# Patient Record
Sex: Male | Born: 1940 | Race: White | Hispanic: No | Smoking: Current every day smoker
Health system: Southern US, Community
[De-identification: ages and names within clinical notes are randomized; demographics above are authoritative.]

## PROBLEM LIST (undated history)

## (undated) DIAGNOSIS — K5909 Other constipation: Secondary | ICD-10-CM

## (undated) DIAGNOSIS — N183 Chronic kidney disease, stage 3 unspecified: Secondary | ICD-10-CM

## (undated) DIAGNOSIS — G629 Polyneuropathy, unspecified: Secondary | ICD-10-CM

## (undated) DIAGNOSIS — I10 Essential (primary) hypertension: Secondary | ICD-10-CM

## (undated) DIAGNOSIS — E785 Hyperlipidemia, unspecified: Secondary | ICD-10-CM

## (undated) DIAGNOSIS — R0600 Dyspnea, unspecified: Secondary | ICD-10-CM

## (undated) DIAGNOSIS — I5030 Unspecified diastolic (congestive) heart failure: Secondary | ICD-10-CM

## (undated) DIAGNOSIS — M1990 Unspecified osteoarthritis, unspecified site: Secondary | ICD-10-CM

## (undated) DIAGNOSIS — Z860100 Personal history of colon polyps, unspecified: Secondary | ICD-10-CM

## (undated) DIAGNOSIS — Z8711 Personal history of peptic ulcer disease: Secondary | ICD-10-CM

## (undated) DIAGNOSIS — D509 Iron deficiency anemia, unspecified: Secondary | ICD-10-CM

## (undated) DIAGNOSIS — Z7901 Long term (current) use of anticoagulants: Secondary | ICD-10-CM

## (undated) DIAGNOSIS — R001 Bradycardia, unspecified: Secondary | ICD-10-CM

## (undated) DIAGNOSIS — M25561 Pain in right knee: Secondary | ICD-10-CM

## (undated) DIAGNOSIS — G4700 Insomnia, unspecified: Secondary | ICD-10-CM

## (undated) DIAGNOSIS — I712 Thoracic aortic aneurysm, without rupture: Secondary | ICD-10-CM

## (undated) DIAGNOSIS — G8929 Other chronic pain: Secondary | ICD-10-CM

## (undated) DIAGNOSIS — I7121 Aneurysm of the ascending aorta, without rupture: Secondary | ICD-10-CM

## (undated) DIAGNOSIS — R0609 Other forms of dyspnea: Secondary | ICD-10-CM

## (undated) DIAGNOSIS — I739 Peripheral vascular disease, unspecified: Secondary | ICD-10-CM

## (undated) DIAGNOSIS — Z8719 Personal history of other diseases of the digestive system: Secondary | ICD-10-CM

## (undated) DIAGNOSIS — F329 Major depressive disorder, single episode, unspecified: Secondary | ICD-10-CM

## (undated) DIAGNOSIS — E119 Type 2 diabetes mellitus without complications: Secondary | ICD-10-CM

## (undated) DIAGNOSIS — Z720 Tobacco use: Secondary | ICD-10-CM

## (undated) DIAGNOSIS — T8453XA Infection and inflammatory reaction due to internal right knee prosthesis, initial encounter: Secondary | ICD-10-CM

## (undated) DIAGNOSIS — F32A Depression, unspecified: Secondary | ICD-10-CM

## (undated) DIAGNOSIS — Z9889 Other specified postprocedural states: Secondary | ICD-10-CM

## (undated) DIAGNOSIS — K219 Gastro-esophageal reflux disease without esophagitis: Secondary | ICD-10-CM

## (undated) HISTORY — DX: Bradycardia, unspecified: R00.1

## (undated) HISTORY — DX: Personal history of other diseases of the digestive system: Z87.19

## (undated) HISTORY — PX: COLONOSCOPY: SHX174

## (undated) HISTORY — DX: Essential (primary) hypertension: I10

## (undated) HISTORY — PX: ANTERIOR CERVICAL DECOMP/DISCECTOMY FUSION: SHX1161

## (undated) HISTORY — PX: UPPER ENDOSCOPY W/ SCLEROTHERAPY: SHX2606

## (undated) HISTORY — PX: LUMBAR FUSION: SHX111

## (undated) HISTORY — PX: SPLENECTOMY: SUR1306

## (undated) HISTORY — PX: TONSILLECTOMY: SUR1361

---

## 1998-11-01 ENCOUNTER — Ambulatory Visit (HOSPITAL_COMMUNITY): Admission: RE | Admit: 1998-11-01 | Discharge: 1998-11-01 | Payer: Self-pay | Admitting: Family Medicine

## 1998-11-01 ENCOUNTER — Encounter: Payer: Self-pay | Admitting: Family Medicine

## 2004-09-13 ENCOUNTER — Ambulatory Visit (HOSPITAL_COMMUNITY): Admission: RE | Admit: 2004-09-13 | Discharge: 2004-09-13 | Payer: Self-pay | Admitting: Family Medicine

## 2005-01-28 DIAGNOSIS — Z8719 Personal history of other diseases of the digestive system: Secondary | ICD-10-CM

## 2005-01-28 HISTORY — DX: Personal history of other diseases of the digestive system: Z87.19

## 2005-01-28 HISTORY — PX: UMBILICAL HERNIA REPAIR: SHX196

## 2005-06-11 ENCOUNTER — Ambulatory Visit (HOSPITAL_COMMUNITY): Admission: RE | Admit: 2005-06-11 | Discharge: 2005-06-11 | Payer: Self-pay | Admitting: Family Medicine

## 2005-07-31 ENCOUNTER — Emergency Department (HOSPITAL_COMMUNITY): Admission: EM | Admit: 2005-07-31 | Discharge: 2005-07-31 | Payer: Self-pay | Admitting: Emergency Medicine

## 2005-08-03 ENCOUNTER — Emergency Department (HOSPITAL_COMMUNITY): Admission: EM | Admit: 2005-08-03 | Discharge: 2005-08-03 | Payer: Self-pay | Admitting: Emergency Medicine

## 2005-08-07 ENCOUNTER — Encounter (HOSPITAL_COMMUNITY): Admission: RE | Admit: 2005-08-07 | Discharge: 2005-10-10 | Payer: Self-pay | Admitting: Emergency Medicine

## 2005-08-16 ENCOUNTER — Ambulatory Visit (HOSPITAL_COMMUNITY): Admission: RE | Admit: 2005-08-16 | Discharge: 2005-08-16 | Payer: Self-pay | Admitting: Neurosurgery

## 2005-09-09 ENCOUNTER — Ambulatory Visit (HOSPITAL_COMMUNITY): Admission: RE | Admit: 2005-09-09 | Discharge: 2005-09-09 | Payer: Self-pay

## 2005-10-08 ENCOUNTER — Encounter: Admission: RE | Admit: 2005-10-08 | Discharge: 2005-10-08 | Payer: Self-pay | Admitting: Neurosurgery

## 2005-10-16 ENCOUNTER — Inpatient Hospital Stay (HOSPITAL_COMMUNITY): Admission: EM | Admit: 2005-10-16 | Discharge: 2005-10-20 | Payer: Self-pay | Admitting: Emergency Medicine

## 2005-10-16 ENCOUNTER — Ambulatory Visit: Payer: Self-pay | Admitting: Internal Medicine

## 2005-10-23 ENCOUNTER — Ambulatory Visit: Payer: Self-pay | Admitting: Internal Medicine

## 2005-11-08 ENCOUNTER — Ambulatory Visit: Payer: Self-pay | Admitting: Internal Medicine

## 2005-11-21 ENCOUNTER — Ambulatory Visit (HOSPITAL_BASED_OUTPATIENT_CLINIC_OR_DEPARTMENT_OTHER): Admission: RE | Admit: 2005-11-21 | Discharge: 2005-11-21 | Payer: Self-pay | Admitting: Family Medicine

## 2005-11-24 ENCOUNTER — Ambulatory Visit: Payer: Self-pay | Admitting: Internal Medicine

## 2005-12-03 ENCOUNTER — Ambulatory Visit: Payer: Self-pay | Admitting: Internal Medicine

## 2010-05-21 ENCOUNTER — Encounter: Payer: Self-pay | Admitting: Physician Assistant

## 2010-05-21 ENCOUNTER — Telehealth: Payer: Self-pay | Admitting: Internal Medicine

## 2010-05-21 ENCOUNTER — Other Ambulatory Visit (INDEPENDENT_AMBULATORY_CARE_PROVIDER_SITE_OTHER): Payer: Medicare Other

## 2010-05-21 ENCOUNTER — Ambulatory Visit (INDEPENDENT_AMBULATORY_CARE_PROVIDER_SITE_OTHER): Payer: Medicare Other | Admitting: Physician Assistant

## 2010-05-21 VITALS — BP 122/82 | HR 60 | Ht 70.0 in | Wt 218.0 lb

## 2010-05-21 DIAGNOSIS — Z860100 Personal history of colon polyps, unspecified: Secondary | ICD-10-CM

## 2010-05-21 DIAGNOSIS — I10 Essential (primary) hypertension: Secondary | ICD-10-CM

## 2010-05-21 DIAGNOSIS — M51379 Other intervertebral disc degeneration, lumbosacral region without mention of lumbar back pain or lower extremity pain: Secondary | ICD-10-CM

## 2010-05-21 DIAGNOSIS — R634 Abnormal weight loss: Secondary | ICD-10-CM

## 2010-05-21 DIAGNOSIS — Z8711 Personal history of peptic ulcer disease: Secondary | ICD-10-CM

## 2010-05-21 DIAGNOSIS — R1013 Epigastric pain: Secondary | ICD-10-CM

## 2010-05-21 DIAGNOSIS — M5136 Other intervertebral disc degeneration, lumbar region: Secondary | ICD-10-CM | POA: Insufficient documentation

## 2010-05-21 DIAGNOSIS — I119 Hypertensive heart disease without heart failure: Secondary | ICD-10-CM | POA: Insufficient documentation

## 2010-05-21 DIAGNOSIS — E119 Type 2 diabetes mellitus without complications: Secondary | ICD-10-CM

## 2010-05-21 DIAGNOSIS — M5137 Other intervertebral disc degeneration, lumbosacral region: Secondary | ICD-10-CM

## 2010-05-21 LAB — CBC WITH DIFFERENTIAL/PLATELET
Basophils Absolute: 0.1 10*3/uL (ref 0.0–0.1)
Eosinophils Absolute: 0.3 10*3/uL (ref 0.0–0.7)
Lymphocytes Relative: 32.3 % (ref 12.0–46.0)
MCHC: 34.9 g/dL (ref 30.0–36.0)
MCV: 99.3 fl (ref 78.0–100.0)
Monocytes Absolute: 0.6 10*3/uL (ref 0.1–1.0)
Neutrophils Relative %: 55.9 % (ref 43.0–77.0)
Platelets: 225 10*3/uL (ref 150.0–400.0)

## 2010-05-21 LAB — LIPASE: Lipase: 190 U/L — ABNORMAL HIGH (ref 11.0–59.0)

## 2010-05-21 MED ORDER — ESOMEPRAZOLE MAGNESIUM 40 MG PO CPDR: 40.0000 mg | DELAYED_RELEASE_CAPSULE | Freq: Every day | ORAL | Status: DC

## 2010-05-21 NOTE — Telephone Encounter (Signed)
Pt is having abdominal pain and has tried various OTC meds and Nexium that Dr. Hennie Duos prescribed. Pt is also having black stools, has history of ulcers. Dr. Jeannetta Nap would like to have the patient seen today. Appt made for pt to see Mike Gip, PA today at 3pm. Christy at Dr. Hennie Duos office to notify pt of appt date and time.

## 2010-05-21 NOTE — Progress Notes (Signed)
Agree with assessment and plans as outlined 

## 2010-05-21 NOTE — Progress Notes (Signed)
Subjective:    Patient ID: Marc [name], male    DOB: 1940-05-22, 70 y.o.   MRN: [id number]  HPI Marc [name] is a pleasant 69 year old white male known to Dr. Georga Hacking. He has history of an acute GI bleed in 2007 for which she was hospitalized and required approximately 10 units of blood per patient. He was found to have antral ulcer versus one of which was treated with epi and BiCAP. This was felt to be NSAID-induced. He also had colonoscopy in 2007 with removal of 2 colon polyps, one adenomatous and one hyperplastic. He will be due for followup September 2012.  Patient is referred today by Dr. Windle Guard for evaluation of acute abdominal pain. Patient relates he had onset of pain approximately a week ago and that his pain has been constant ever since. He describes it as an aching burning type of pain which only eases off with sleep. This seems to be worse with any by mouth intake and he has lost 7 or 8 pounds since onset. He has not had any nausea or vomiting, no fever or chills. He denies any heartburn indigestion or dysphagia. He has not been on any recent aspirin or NSAIDs and no EtOH. He has also been having black stools with one bowel movement per day. He had been taking antacids as well as Pepto-Bismol which he stopped over the weekend. He says his stools to look dark this morning. He was seen by Dr. Jeannetta Nap late last week and was started on Nexium 40 mg daily but has not noticed any difference in his symptoms as yet. He also had labs done which showed a glucose of 268 liver function studies were normal amylase 89 lipase was elevated at 277.    Review of Systems  Constitutional: Positive for appetite change and unexpected weight change.  HENT: Negative.   Eyes: Negative.   Respiratory: Negative.   Cardiovascular: Negative.   Gastrointestinal: Positive for abdominal pain.  Genitourinary: Negative.   Musculoskeletal: Positive for back pain.  Skin: Negative.   Neurological: Negative.     Hematological: Negative.   Psychiatric/Behavioral: Negative.    Outpatient Encounter Prescriptions as of 05/21/2010  Medication Sig Dispense Refill  . allopurinol (ZYLOPRIM) 100 MG tablet Take 100 mg by mouth daily.        [name] gabapentin (NEURONTIN) 600 MG tablet Take 600 mg by mouth 3 (three) times daily. 2 tablets every 6 hours as needed       . glimepiride (AMARYL) 4 MG tablet Take 4 mg by mouth daily before breakfast.        . lisinopril (PRINIVIL,ZESTRIL) 20 MG tablet Take 20 mg by mouth daily.        [name] METOPROLOL TARTRATE PO Take by mouth. 50 mg 1 by mouth every day       . esomeprazole (NEXIUM) 40 MG capsule Take 1 capsule (40 mg total) by mouth daily.  30 capsule  1   No Known Allergies     Objective:   Physical Exam Well-developed white male in no acute distress, pleasant, ambulates slowly. HEENT; not reticulocyte normocephalic EOMI PERRLA sclera anicteric  Neck ;supple no JVD  Cardiovascular; regular rate and rhythm with S1-S2 no significant murmur rub or gallop Pulmonary; decreased breath sounds bilaterally  Abdome; soft, somewhat protuberant, bowel sounds active, mildly tender in the epigastrium, no guarding or rebound, no palpable masses or hepatosplenomegaly, long midline incisional scar  Rectal exam; Brown stool Hemoccult negative  Neuro; alert and oriented x3 and  grossly nonfocal  Psych; mood and affect normal an appropriate.        Assessment & Plan:  #15 70 year old male with one-week history of acute epigastric pain, anorexia and weight loss. Recent labs showing an elevated lipase. Rule out mild pancreatitis, pancreatic lesion, rule out recurrent peptic ulcer disease.  Plan; Will check CBC, and repeat amylase and lipase Continue Nexium 40 mg once daily, patient was given additional samples today Schedule for CT scan of the abdomen and pelvis within the next 24-48 hours Advised soft low-fat frequent small meals Further plans pending results of CT scan.  #2 History of  antral ulcers with GI bleed 2007, NSAID-induced-also H. pylori positive and treated.  #3 History of adenomatous and hyperplastic colon polyps Last colonoscopy September 2007 will be due for followup this year.  #4 adult onset diabetes mellitus

## 2010-05-21 NOTE — Patient Instructions (Signed)
We have scheduled the CT scan at [facility]. Directions provided. Nexium samples and CT contrast provided. Stay on the Nexium.

## 2010-05-22 ENCOUNTER — Ambulatory Visit (INDEPENDENT_AMBULATORY_CARE_PROVIDER_SITE_OTHER)
Admission: RE | Admit: 2010-05-22 | Discharge: 2010-05-22 | Disposition: A | Discharge status: Home / Self Care | Payer: Medicare Other | Source: Ambulatory Visit | Attending: Physician Assistant | Admitting: Physician Assistant

## 2010-05-22 DIAGNOSIS — R1013 Epigastric pain: Secondary | ICD-10-CM

## 2010-05-22 DIAGNOSIS — R634 Abnormal weight loss: Secondary | ICD-10-CM

## 2010-05-22 MED ORDER — IOHEXOL 300 MG/ML  SOLN
100.0000 mL | Freq: Once | INTRAMUSCULAR | Status: AC | PRN
  Administered 2010-05-22: 100 mL via INTRAVENOUS

## 2010-05-23 ENCOUNTER — Telehealth: Payer: Self-pay

## 2010-05-23 NOTE — Telephone Encounter (Signed)
Spoke with pt and he is aware of Amy Esterwood's recommendations. Previsit scheduled for 06/01/10@2pm , Endo scheduled for 06/06/10@8 :30am.

## 2010-05-23 NOTE — Telephone Encounter (Signed)
Message copied by Chrystie Nose on Wed May 23, 2010  2:08 PM ------      Message from: [place], [place]      Created: Wed May 23, 2010 12:04 PM       PLEASE CALL Marc [name], AND SEE HOW HE IS FEELING. LET HIM KNOW THE LABS WERE OK EXCEPT THE PANCREAS TEST. CT SHOWS MILD PANCREATITIS,AND POSSIBLE DUODENAL INFLAMMATION/ULCER. BOTH WOULD CAUSE PAIN. HE SHOULD EAT LOW FAT,SCHEDULE FOR EGD WITH DR. PERRY ON 5/9. TAKE WHICHEVER PPI I GAVE HIM TWICE DAILY . HE SHOULD CALL IF HE FEELS WORSE.THANKS

## 2010-05-24 ENCOUNTER — Encounter: Payer: Self-pay | Admitting: Physician Assistant

## 2010-06-01 ENCOUNTER — Encounter: Payer: Self-pay | Admitting: Internal Medicine

## 2010-06-01 ENCOUNTER — Ambulatory Visit (AMBULATORY_SURGERY_CENTER): Payer: Medicare Other | Admitting: *Deleted

## 2010-06-01 VITALS — Ht 74.0 in | Wt 221.5 lb

## 2010-06-01 DIAGNOSIS — R1013 Epigastric pain: Secondary | ICD-10-CM

## 2010-06-05 ENCOUNTER — Encounter: Payer: Self-pay | Admitting: Internal Medicine

## 2010-06-06 ENCOUNTER — Ambulatory Visit (AMBULATORY_SURGERY_CENTER): Payer: Medicare Other | Admitting: Internal Medicine

## 2010-06-06 ENCOUNTER — Encounter: Payer: Self-pay | Admitting: Internal Medicine

## 2010-06-06 VITALS — BP 156/61 | HR 45 | Temp 98.0°F | Resp 14 | Ht 74.0 in | Wt 218.0 lb

## 2010-06-06 DIAGNOSIS — R933 Abnormal findings on diagnostic imaging of other parts of digestive tract: Secondary | ICD-10-CM

## 2010-06-06 DIAGNOSIS — K222 Esophageal obstruction: Secondary | ICD-10-CM

## 2010-06-06 DIAGNOSIS — R1013 Epigastric pain: Secondary | ICD-10-CM

## 2010-06-06 DIAGNOSIS — R109 Unspecified abdominal pain: Secondary | ICD-10-CM

## 2010-06-06 LAB — GLUCOSE, CAPILLARY
Glucose-Capillary: 209 mg/dL — ABNORMAL HIGH (ref 70–99)
Glucose-Capillary: 223 mg/dL — ABNORMAL HIGH (ref 70–99)

## 2010-06-06 MED ORDER — SODIUM CHLORIDE 0.9 % IV SOLN: 500.0000 mL | INTRAVENOUS | Status: DC

## 2010-06-06 NOTE — Patient Instructions (Signed)
CONTINUE NEXIUM UNTIL FINISHED THEN START PRILOSEC OVER THE COUNTER OR GENERIC OMEPRAZOLE 20 MG DAILY UNTIL OFFICE VISIT WITH DR Marina Goodell  PLEASE CALL DR [facility] OFFICE AT [phone] IN THE NEXT 2-3 DAYS TO SCHEDULE AN APPOINTMENT WITH HIM FOR 4-6 WEEKS

## 2010-06-07 ENCOUNTER — Telehealth: Payer: Self-pay | Admitting: *Deleted

## 2010-06-07 NOTE — Telephone Encounter (Signed)
No id on answering machine  

## 2010-07-09 ENCOUNTER — Ambulatory Visit (INDEPENDENT_AMBULATORY_CARE_PROVIDER_SITE_OTHER): Payer: Medicare Other | Admitting: Internal Medicine

## 2010-07-09 ENCOUNTER — Encounter: Payer: Self-pay | Admitting: Internal Medicine

## 2010-07-09 VITALS — BP 126/68 | HR 60 | Ht 74.0 in | Wt 220.0 lb

## 2010-07-09 DIAGNOSIS — K219 Gastro-esophageal reflux disease without esophagitis: Secondary | ICD-10-CM

## 2010-07-09 DIAGNOSIS — K859 Acute pancreatitis without necrosis or infection, unspecified: Secondary | ICD-10-CM

## 2010-07-09 DIAGNOSIS — R1013 Epigastric pain: Secondary | ICD-10-CM

## 2010-07-09 DIAGNOSIS — K222 Esophageal obstruction: Secondary | ICD-10-CM

## 2010-07-09 DIAGNOSIS — Z86010 Personal history of colonic polyps: Secondary | ICD-10-CM

## 2010-07-09 NOTE — Patient Instructions (Signed)
Follow-up with Dr. Perry as needed.  

## 2010-07-09 NOTE — Progress Notes (Signed)
HISTORY OF PRESENT ILLNESS:  Marc [name] is a 70 y.o. male with hypertension, diabetes mellitus, and obesity. He is followed in this office for history of peptic ulcer disease complicated by GI bleeding and adenomatous colon polyps. Evaluated in April 2012 for upper abdominal pain and weight loss. Mildly elevated lipase. CT scan showing mild stranding around the pancreatic head. Otherwise unremarkable. Subsequent upper endoscopy on 06/06/2010 was remarkable for an incidental distal esophageal stricture. He has since been continued on PPI therapy and asked to followup at this time. Since his endoscopy, he has had no further problems. No recurrent abdominal pain. Good appetite and has gained a few pounds. GI review of systems is remarkable only for mild constipation. His last colonoscopy was in November of 2007. He is due for followup this fall.  REVIEW OF SYSTEMS:  All non-GI ROS negative except for arthritis, back pain, leg swelling.  Past Medical History  Diagnosis Date  . Hypertension   . Diabetes mellitus   . Ulcer disease   . Colon polyp   . Gout   . Bradycardia     Past Surgical History  Procedure Date  . Splenectomy   . Ruptured disk     back  . Ulcer carterized   . Polypectomy   . Colonoscopy     Social History Marc [name]  reports that he has been smoking Cigarettes.  He has a 110 pack-year smoking history. He has never used smokeless tobacco. He reports that he drinks about 5.4 ounces of alcohol per week. He reports that he does not use illicit drugs.  family history includes Colon cancer in his paternal uncle and Heart disease in his father and mother.  No Known Allergies     PHYSICAL EXAMINATION:  Vital signs: BP 126/68  Pulse 60  Ht 6\' 2"  (1.88 m)  Wt 220 lb (99.791 kg)  BMI 28.25 kg/m2 General: Well-developed, well-nourished, no acute distress HEENT: Sclerae are anicteric, conjunctiva pink. Oral mucosa intact Lungs: Clear Heart:  Regular Abdomen: soft, nontender, nondistended, no obvious ascites, no peritoneal signs, normal bowel sounds. No organomegaly. Extremities: No edema Psychiatric: alert and oriented x3. Cooperative     ASSESSMENT:  #1. Recent problems with upper abdominal pain and mild stranding around the pancreatic head. May represent a case of mild pancreatitis. Currently asymptomatic. #2. History of complicated ulcer disease #3. GERD with peptic stricture #4. History of adenomatous colon polyps   PLAN:  #1. Continue daily PPI #2. Surveillance colonoscopy. 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 #3. Resume general medical care with Dr. Jeannetta [name]. GI followup as needed

## 2010-07-10 ENCOUNTER — Encounter: Payer: Self-pay | Admitting: Internal Medicine

## 2010-11-15 ENCOUNTER — Other Ambulatory Visit: Payer: Self-pay | Admitting: Family Medicine

## 2010-11-15 DIAGNOSIS — R110 Nausea: Secondary | ICD-10-CM

## 2010-11-16 ENCOUNTER — Ambulatory Visit
Admission: RE | Admit: 2010-11-16 | Discharge: 2010-11-16 | Disposition: A | Discharge status: Home / Self Care | Payer: Medicare Other | Source: Ambulatory Visit | Attending: Family Medicine | Admitting: Family Medicine

## 2010-11-16 DIAGNOSIS — R110 Nausea: Secondary | ICD-10-CM

## 2010-12-07 ENCOUNTER — Encounter: Payer: Self-pay | Admitting: Internal Medicine

## 2011-02-25 ENCOUNTER — Other Ambulatory Visit: Payer: Self-pay | Admitting: Family Medicine

## 2011-02-25 DIAGNOSIS — M25561 Pain in right knee: Secondary | ICD-10-CM

## 2011-02-27 ENCOUNTER — Ambulatory Visit
Admission: RE | Admit: 2011-02-27 | Discharge: 2011-02-27 | Disposition: A | Discharge status: Home / Self Care | Payer: Medicare Other | Source: Ambulatory Visit | Attending: Family Medicine | Admitting: Family Medicine

## 2011-02-27 DIAGNOSIS — M25561 Pain in right knee: Secondary | ICD-10-CM

## 2011-10-28 ENCOUNTER — Encounter: Payer: Self-pay | Admitting: Internal Medicine

## 2011-12-04 ENCOUNTER — Other Ambulatory Visit: Payer: Self-pay | Admitting: Family Medicine

## 2011-12-04 DIAGNOSIS — I739 Peripheral vascular disease, unspecified: Secondary | ICD-10-CM

## 2011-12-04 DIAGNOSIS — M545 Low back pain: Secondary | ICD-10-CM

## 2011-12-05 ENCOUNTER — Ambulatory Visit
Admission: RE | Admit: 2011-12-05 | Discharge: 2011-12-05 | Disposition: A | Discharge status: Home / Self Care | Payer: Medicare Other | Source: Ambulatory Visit | Attending: Family Medicine | Admitting: Family Medicine

## 2011-12-05 DIAGNOSIS — I739 Peripheral vascular disease, unspecified: Secondary | ICD-10-CM

## 2011-12-05 DIAGNOSIS — M545 Low back pain: Secondary | ICD-10-CM

## 2012-01-10 ENCOUNTER — Other Ambulatory Visit (HOSPITAL_COMMUNITY): Payer: Self-pay | Admitting: Cardiovascular Disease

## 2012-01-10 DIAGNOSIS — E119 Type 2 diabetes mellitus without complications: Secondary | ICD-10-CM

## 2012-01-10 DIAGNOSIS — I10 Essential (primary) hypertension: Secondary | ICD-10-CM

## 2012-01-10 DIAGNOSIS — I739 Peripheral vascular disease, unspecified: Secondary | ICD-10-CM

## 2012-01-16 ENCOUNTER — Other Ambulatory Visit: Payer: Self-pay | Admitting: Cardiovascular Disease

## 2012-01-16 ENCOUNTER — Ambulatory Visit: Payer: Medicare Other

## 2012-01-16 DIAGNOSIS — Z01818 Encounter for other preprocedural examination: Secondary | ICD-10-CM

## 2012-01-16 DIAGNOSIS — I10 Essential (primary) hypertension: Secondary | ICD-10-CM

## 2012-01-23 ENCOUNTER — Encounter (HOSPITAL_COMMUNITY): Payer: Self-pay | Admitting: Pharmacy Technician

## 2012-01-24 ENCOUNTER — Ambulatory Visit (HOSPITAL_COMMUNITY)
Admission: RE | Admit: 2012-01-24 | Discharge: 2012-01-24 | Disposition: A | Discharge status: Home / Self Care | Payer: Medicare Other | Source: Ambulatory Visit | Attending: Cardiovascular Disease | Admitting: Cardiovascular Disease

## 2012-01-24 DIAGNOSIS — E119 Type 2 diabetes mellitus without complications: Secondary | ICD-10-CM | POA: Insufficient documentation

## 2012-01-24 DIAGNOSIS — I739 Peripheral vascular disease, unspecified: Secondary | ICD-10-CM | POA: Insufficient documentation

## 2012-01-24 DIAGNOSIS — I10 Essential (primary) hypertension: Secondary | ICD-10-CM | POA: Insufficient documentation

## 2012-01-24 MED ORDER — TECHNETIUM TC 99M SESTAMIBI GENERIC - CARDIOLITE
10.2000 | Freq: Once | INTRAVENOUS | Status: AC | PRN
  Administered 2012-01-24: 10 via INTRAVENOUS

## 2012-01-24 MED ORDER — AMINOPHYLLINE 25 MG/ML IV SOLN
75.0000 mg | Freq: Once | INTRAVENOUS | Status: AC
  Administered 2012-01-24: 75 mg via INTRAVENOUS

## 2012-01-24 MED ORDER — REGADENOSON 0.4 MG/5ML IV SOLN
0.4000 mg | Freq: Once | INTRAVENOUS | Status: AC
  Administered 2012-01-24: 0.4 mg via INTRAVENOUS

## 2012-01-24 MED ORDER — TECHNETIUM TC 99M SESTAMIBI GENERIC - CARDIOLITE
30.5000 | Freq: Once | INTRAVENOUS | Status: AC | PRN
  Administered 2012-01-24: 31 via INTRAVENOUS

## 2012-01-24 NOTE — Procedures (Addendum)
[place] [facility] [address] [place] [address] [phone]  Cardiology Nuclear Med Study  Marc [name] is a 71 y.o. male     MRN : [id number]     DOB: Jun 07, 1940  Procedure Date: 01/24/2012  Nuclear Med Background Indication for Stress Test:  Evaluation for Ischemia History:  PVD Cardiac Risk Factors: Family History - CAD, Hypertension, Lipids, NIDDM, Overweight and Smoker  Symptoms:  None   Nuclear Pre-Procedure Caffeine/Decaff Intake:  7:00pm NPO After: 7:00am   IV Site: R Antecubital  IV 0.9% NS with Angio Cath:  22g  Chest Size (in):  44 IV Started by: Koren Shiver, CNMT  Height: 6\' 1"  (1.854 m)  Cup Size: n/a  BMI:  Body mass index is 28.50 kg/(m^2). Weight:  216 lb (97.977 kg)   Tech Comments:  n/a    Nuclear Med Study 1 or 2 day study: 1 day  Stress Test Type:  Lexiscan  Order Authorizing Provider:  Nanetta Batty, MD   Resting Radionuclide: Technetium 7m Sestamibi  Resting Radionuclide Dose: 10.2 mCi   Stress Radionuclide:  Technetium 36m Sestamibi  Stress Radionuclide Dose: 30.5 mCi           Stress Protocol Rest HR: 42 Stress HR: 53  Rest BP: 207/91 Stress BP: 187/66  Exercise Time (min): n/a METS: n/a   Predicted Max HR: 148 bpm % Max HR: 35.81 bpm Rate Pressure Product: 95284   Dose of Adenosine (mg):  n/a Dose of Lexiscan: 0.4 mg  Dose of Atropine (mg): n/a Dose of Dobutamine: n/a mcg/kg/min (at max HR)  Stress Test Technologist: Esperanza Sheets, CCT Nuclear Technologist: Gonzella Lex, CNMT   Rest Procedure:  Myocardial perfusion imaging was performed at rest 45 minutes following the intravenous administration of Technetium 100m Sestamibi. Stress Procedure:  The patient received IV Lexiscan 0.4 mg over 15-seconds.  Technetium 69m Sestamibi injected at 30-seconds.  There were no significant changes with Lexiscan.  The Patient experienced SOB and Lightheadedness; 75 mg. IV Aminophylline was  administered with resolution of symptoms.  Quantitative spect images were obtained after a 45 minute delay.  Transient Ischemic Dilatation (Normal <1.22):  0.98 Lung/Heart Ratio (Normal <0.45):  0.34 QGS EDV:  139 ml QGS ESV:  58 ml LV Ejection Fraction: 58%  Signed by       Rest ECG: Sinus Bradycardia  Stress ECG: No significant change from baseline ECG  QPS Raw Data Images:  Normal; no motion artifact; normal heart/lung ratio. Stress Images:  Normal homogeneous uptake in all areas of the myocardium. Rest Images:  Normal homogeneous uptake in all areas of the myocardium. Subtraction (SDS):  No evidence of ischemia.  Impression Exercise Capacity:  Lexiscan with no exercise. BP Response:  Normal blood pressure response. Clinical Symptoms:  No significant symptoms noted. ECG Impression:  No significant ST segment change suggestive of ischemia. Comparison with Prior Nuclear Study: No previous nuclear study performed  Overall Impression:  Normal stress nuclear study.  LV Wall Motion:  NL LV Function; NL Wall Motion   Runell Gess, MD  01/24/2012 6:16 PM

## 2012-01-31 ENCOUNTER — Ambulatory Visit (HOSPITAL_COMMUNITY)
Admission: RE | Admit: 2012-01-31 | Discharge: 2012-01-31 | Disposition: A | Discharge status: Home / Self Care | Payer: Medicare Other | Source: Ambulatory Visit | Attending: Cardiovascular Disease | Admitting: Cardiovascular Disease

## 2012-01-31 DIAGNOSIS — I739 Peripheral vascular disease, unspecified: Secondary | ICD-10-CM | POA: Insufficient documentation

## 2012-01-31 NOTE — Progress Notes (Signed)
Arterial Doppler completed. Marc [name]

## 2012-02-04 ENCOUNTER — Encounter (HOSPITAL_COMMUNITY): Payer: Self-pay | Admitting: General Practice

## 2012-02-04 ENCOUNTER — Ambulatory Visit (HOSPITAL_COMMUNITY)
Admission: RE | Admit: 2012-02-04 | Discharge: 2012-02-05 | Disposition: A | Discharge status: Home / Self Care | Payer: Medicare Other | Source: Ambulatory Visit | Attending: Cardiovascular Disease | Admitting: Cardiovascular Disease

## 2012-02-04 ENCOUNTER — Encounter (HOSPITAL_COMMUNITY)
Admission: RE | Disposition: A | Discharge status: Home / Self Care | Payer: Self-pay | Source: Ambulatory Visit | Attending: Cardiovascular Disease

## 2012-02-04 DIAGNOSIS — I498 Other specified cardiac arrhythmias: Secondary | ICD-10-CM | POA: Insufficient documentation

## 2012-02-04 DIAGNOSIS — F17200 Nicotine dependence, unspecified, uncomplicated: Secondary | ICD-10-CM | POA: Insufficient documentation

## 2012-02-04 DIAGNOSIS — I119 Hypertensive heart disease without heart failure: Secondary | ICD-10-CM | POA: Diagnosis present

## 2012-02-04 DIAGNOSIS — R001 Bradycardia, unspecified: Secondary | ICD-10-CM | POA: Diagnosis present

## 2012-02-04 DIAGNOSIS — I70219 Atherosclerosis of native arteries of extremities with intermittent claudication, unspecified extremity: Secondary | ICD-10-CM | POA: Insufficient documentation

## 2012-02-04 DIAGNOSIS — I739 Peripheral vascular disease, unspecified: Secondary | ICD-10-CM

## 2012-02-04 DIAGNOSIS — E119 Type 2 diabetes mellitus without complications: Secondary | ICD-10-CM | POA: Insufficient documentation

## 2012-02-04 DIAGNOSIS — Z79899 Other long term (current) drug therapy: Secondary | ICD-10-CM | POA: Insufficient documentation

## 2012-02-04 DIAGNOSIS — I10 Essential (primary) hypertension: Secondary | ICD-10-CM

## 2012-02-04 DIAGNOSIS — E785 Hyperlipidemia, unspecified: Secondary | ICD-10-CM | POA: Insufficient documentation

## 2012-02-04 HISTORY — DX: Peripheral vascular disease, unspecified: I73.9

## 2012-02-04 HISTORY — PX: LOWER EXTREMITY ANGIOGRAM: SHX5508

## 2012-02-04 HISTORY — PX: PERCUTANEOUS STENT INTERVENTION: SHX5500

## 2012-02-04 HISTORY — DX: Type 2 diabetes mellitus without complications: E11.9

## 2012-02-04 LAB — POCT ACTIVATED CLOTTING TIME
Activated Clotting Time: 252 seconds
Activated Clotting Time: 165 seconds

## 2012-02-04 LAB — GLUCOSE, CAPILLARY: Glucose-Capillary: 182 mg/dL — ABNORMAL HIGH (ref 70–99)

## 2012-02-04 SURGERY — ANGIOGRAM, LOWER EXTREMITY
Anesthesia: LOCAL

## 2012-02-04 MED ORDER — HYDROCODONE-ACETAMINOPHEN 10-325 MG PO TABS
2.0000 | ORAL_TABLET | Freq: Three times a day (TID) | ORAL | Status: DC
  Administered 2012-02-04 – 2012-02-05 (×2): 2 via ORAL
  Filled 2012-02-04 (×2): qty 1
  Filled 2012-02-04: qty 2

## 2012-02-04 MED ORDER — METOPROLOL TARTRATE 100 MG PO TABS: 100.0000 mg | ORAL_TABLET | Freq: Every day | ORAL | Status: DC

## 2012-02-04 MED ORDER — PREDNISONE 10 MG PO TABS
10.0000 mg | ORAL_TABLET | Freq: Every day | ORAL | Status: DC
  Administered 2012-02-04 – 2012-02-05 (×2): 10 mg via ORAL
  Filled 2012-02-04 (×3): qty 1

## 2012-02-04 MED ORDER — LISINOPRIL 20 MG PO TABS
20.0000 mg | ORAL_TABLET | Freq: Every day | ORAL | Status: DC
  Administered 2012-02-05: 11:00:00 20 mg via ORAL
  Filled 2012-02-04: qty 1

## 2012-02-04 MED ORDER — HEPARIN SODIUM (PORCINE) 1000 UNIT/ML IJ SOLN
INTRAMUSCULAR | Status: AC
  Filled 2012-02-04: qty 1

## 2012-02-04 MED ORDER — MIDAZOLAM HCL 2 MG/2ML IJ SOLN
INTRAMUSCULAR | Status: AC
  Filled 2012-02-04: qty 2

## 2012-02-04 MED ORDER — HEPARIN (PORCINE) IN NACL 2-0.9 UNIT/ML-% IJ SOLN
INTRAMUSCULAR | Status: AC
  Filled 2012-02-04: qty 1000

## 2012-02-04 MED ORDER — SIMVASTATIN 40 MG PO TABS
40.0000 mg | ORAL_TABLET | Freq: Every day | ORAL | Status: DC
  Administered 2012-02-04: 20:00:00 40 mg via ORAL
  Filled 2012-02-04 (×2): qty 1

## 2012-02-04 MED ORDER — ASPIRIN EC 325 MG PO TBEC
325.0000 mg | DELAYED_RELEASE_TABLET | Freq: Every day | ORAL | Status: DC
  Administered 2012-02-05: 325 mg via ORAL
  Filled 2012-02-04: qty 1

## 2012-02-04 MED ORDER — SODIUM CHLORIDE 0.9 % IJ SOLN: 3.0000 mL | INTRAMUSCULAR | Status: DC | PRN

## 2012-02-04 MED ORDER — SODIUM CHLORIDE 0.9 % IV SOLN
INTRAVENOUS | Status: DC
  Administered 2012-02-04: 09:00:00 via INTRAVENOUS

## 2012-02-04 MED ORDER — FENTANYL CITRATE 0.05 MG/ML IJ SOLN
INTRAMUSCULAR | Status: AC
  Filled 2012-02-04: qty 2

## 2012-02-04 MED ORDER — CLOPIDOGREL BISULFATE 300 MG PO TABS
300.0000 mg | ORAL_TABLET | Freq: Once | ORAL | Status: AC
  Administered 2012-02-04: 300 mg via ORAL

## 2012-02-04 MED ORDER — ALLOPURINOL 300 MG PO TABS
300.0000 mg | ORAL_TABLET | Freq: Every day | ORAL | Status: DC
  Administered 2012-02-04 – 2012-02-05 (×2): 300 mg via ORAL
  Filled 2012-02-04 (×2): qty 1

## 2012-02-04 MED ORDER — DIAZEPAM 5 MG PO TABS
5.0000 mg | ORAL_TABLET | ORAL | Status: AC
  Administered 2012-02-04: 5 mg via ORAL
  Filled 2012-02-04: qty 1

## 2012-02-04 MED ORDER — ONDANSETRON HCL 4 MG/2ML IJ SOLN: 4.0000 mg | Freq: Four times a day (QID) | INTRAMUSCULAR | Status: DC | PRN

## 2012-02-04 MED ORDER — LIDOCAINE HCL (PF) 1 % IJ SOLN
INTRAMUSCULAR | Status: AC
  Filled 2012-02-04: qty 30

## 2012-02-04 MED ORDER — SODIUM CHLORIDE 0.9 % IV SOLN
INTRAVENOUS | Status: AC
  Administered 2012-02-04: 14:00:00 via INTRAVENOUS

## 2012-02-04 MED ORDER — ACETAMINOPHEN 325 MG PO TABS: 650.0000 mg | ORAL_TABLET | ORAL | Status: DC | PRN

## 2012-02-04 MED ORDER — PIOGLITAZONE HCL 30 MG PO TABS
30.0000 mg | ORAL_TABLET | Freq: Every day | ORAL | Status: DC
  Administered 2012-02-04 – 2012-02-05 (×2): 30 mg via ORAL
  Filled 2012-02-04 (×3): qty 1

## 2012-02-04 MED ORDER — PANTOPRAZOLE SODIUM 40 MG PO TBEC: 40.0000 mg | DELAYED_RELEASE_TABLET | Freq: Every day | ORAL | Status: DC

## 2012-02-04 MED ORDER — MORPHINE SULFATE 2 MG/ML IJ SOLN: 2.0000 mg | INTRAMUSCULAR | Status: DC | PRN

## 2012-02-04 MED ORDER — HYDRALAZINE HCL 20 MG/ML IJ SOLN
10.0000 mg | INTRAMUSCULAR | Status: DC | PRN
  Administered 2012-02-04: 17:00:00 10 mg via INTRAVENOUS
  Filled 2012-02-04: qty 0.5

## 2012-02-04 MED ORDER — CLOPIDOGREL BISULFATE 300 MG PO TABS
ORAL_TABLET | ORAL | Status: AC
  Filled 2012-02-04: qty 1

## 2012-02-04 MED ORDER — CLOPIDOGREL BISULFATE 75 MG PO TABS
75.0000 mg | ORAL_TABLET | Freq: Every day | ORAL | Status: DC
  Administered 2012-02-05: 75 mg via ORAL
  Filled 2012-02-04: qty 1

## 2012-02-04 MED ORDER — HYDRALAZINE HCL 20 MG/ML IJ SOLN
INTRAMUSCULAR | Status: AC
  Filled 2012-02-04: qty 1

## 2012-02-04 MED ORDER — GABAPENTIN 600 MG PO TABS
1200.0000 mg | ORAL_TABLET | Freq: Three times a day (TID) | ORAL | Status: DC
  Administered 2012-02-04 – 2012-02-05 (×2): 1200 mg via ORAL
  Filled 2012-02-04 (×6): qty 2

## 2012-02-04 MED ORDER — GLIMEPIRIDE 4 MG PO TABS
4.0000 mg | ORAL_TABLET | Freq: Two times a day (BID) | ORAL | Status: DC
  Administered 2012-02-04 – 2012-02-05 (×2): 4 mg via ORAL
  Filled 2012-02-04 (×4): qty 1

## 2012-02-04 MED ORDER — OMEPRAZOLE MAGNESIUM 20 MG PO TBEC: 20.0000 mg | DELAYED_RELEASE_TABLET | Freq: Every day | ORAL | Status: DC

## 2012-02-04 NOTE — Plan of Care (Signed)
Problem: Consults Goal: Tobacco Cessation referral if indicated Outcome: Completed/Met Date Met:  02/04/12 Pt said "there ain't no way I'm quitting.  I've been smoking since I was 72 years old."  Shows no desire for further education and becomes irritable if staff attempts to educate or bothers him for any reason.

## 2012-02-04 NOTE — Op Note (Signed)
Marc [name] is a 72 y.o. male    [id number] LOCATION:  FACILITY: [facility]  PHYSICIAN: Nanetta Batty, M.D. Jan 02, 1941   DATE OF PROCEDURE:  02/04/2012  DATE OF DISCHARGE:  [facility]  PV Intervention    History obtained from chart review. Marc [name] is a 72 year old moderately overweight married Caucasian male father of 2 who works at farming and raising cattle. He was referred through the courtesy of Dr. Andrey Campanile FOR peripheral vascular evaluation because of lifestyle limiting claudication. His risk factors include 120-pack-year history tobacco abuse, smoking 2 pack packs a day since age 72. He is treated diabetes, hypertension and hyperlipidemia. His father died of a myocardial infarction at age 87. He said claudication for the last 2-3 years, worse over the last several months, right greater than the left at less than one block. He had Dopplers performed revealing a right ABI 0.36 and 11.53 with what appears to be occluded occluded SFAs bilaterally with tibial disease as well. The Myoview stress test was low risk. He presents now for angiography and potential endovascular intervention.   PROCEDURE DESCRIPTION:    The patient was brought to the second floor  [facility] cath lab in the postabsorptive state. He was premedicated withValium 5 mg by mouth, IV Versed and fentanyl. His left groinwas prepped and shaved in usual sterile fashion. Xylocaine 1% was used for local anesthesia. A 5 French sheath was inserted into the left common femoral artery using standard Seldinger technique. The patient received 6000 units  of heparin  intravenously.  A 5 French pigtail was used for abdominal aortography with bifemoral graft using bolus chase digital subtraction step table technique. Visipaque dye was used for the entirety of the case. Retrograde aortic, left ventricular and pulmonary pressures were recorded.    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 With a 30 mm gradient noted with endhole pullback after administration of 30 micrograms of intra-arterial nitroglycerin glycerin. The SFA was occluded at its origin a reconstituted in the abductor canal. There was two-vessel run off with an occluded anterior tibial  3: Right lower extremity- the proximal and mid third of the right SFA was diffusely diseased and calcified. There is a short segment calcified occlusion at the junction of the middle and distal third of the SFA reconstituting just beyond this with one-vessel runoff peroneal. The anterior posterior tib at the ankle joint reconstituted by peroneal collaterals  IMPRESSION:Marc [name] has a moderate left iliac lesion and bilateral SFA occlusion with infrapopliteal disease. He'll leave extensive procedure on his right SFA including diamondback orbital rotational atherectomy, PTA and stenting. We'll proceed with PTA and stenting of his left common/external iliac artery today and state the right SFA procedure.  Procedure description: The short 5 French sheath was exchanged over an Building surveyor for a 7 [place] Bright tipped sheath. A total of 178 cc of contrast was administered to the patient. The ACT was measured at 252. The lesion was primarily stented with a 14 mm x 40 mm long Cordis Smart Nitinol self-expanding stent. It was post dilated with a 9 x 30 mm balloon at 4 atmospheres resulting in reduction of a 60% stenosis to 0% residual with excellent flow. The patient tolerated the procedure well. The sheath will  be removed once the ACT falls below 170 and pressure will be held on the groin to achieve hemostasis. Patient was gently hydrated overnight and discharged home in the morning. He will  be treated with aspirin Plavix. Plan will be for staged right SFA diamondback orbital rotational atherectomy, PTA and stenting. I discussed the case with the referring doctor, Dr. Windle Guard.  Marc Gess MD, [facility] 02/04/2012 1:19 PM

## 2012-02-04 NOTE — Progress Notes (Signed)
Site area: left groin  Site Prior to Removal:  Level 0  Pressure Applied For 20 MINUTES    Minutes Beginning at 1545  Manual:   yes  Patient Status During Pull:  stable  Post Pull Groin Site:  Level 0  Post Pull Instructions Given:  yes  Post Pull Pulses Present:  yes  Dressing Applied:  yes  Comments:

## 2012-02-04 NOTE — H&P (Signed)
  H & P will be scanned in.  Pt was reexamined and existing H & P reviewed. No changes found.  Runell Gess, MD [facility] 02/04/2012 12:19 PM

## 2012-02-04 NOTE — Progress Notes (Signed)
Utilization Review Completed.   Ricardo Kayes, RN, BSN Nurse Case Manager  [phone]  

## 2012-02-05 DIAGNOSIS — R001 Bradycardia, unspecified: Secondary | ICD-10-CM | POA: Diagnosis present

## 2012-02-05 LAB — BASIC METABOLIC PANEL
BUN: 20 mg/dL (ref 6–23)
CO2: 26 mEq/L (ref 19–32)
Calcium: 9.8 mg/dL (ref 8.4–10.5)
Chloride: 104 mEq/L (ref 96–112)
Creatinine, Ser: 1.32 mg/dL (ref 0.50–1.35)
GFR calc Af Amer: 61 mL/min — ABNORMAL LOW (ref >90)
GFR calc non Af Amer: 52 mL/min — ABNORMAL LOW (ref >90)
Glucose, Bld: 182 mg/dL — ABNORMAL HIGH (ref 70–99)
Glucose, Bld: 200 mg/dL — ABNORMAL HIGH (ref 70–99)
Sodium: 141 mEq/L (ref 135–145)

## 2012-02-05 LAB — CBC
HCT: 41.5 % (ref 39.0–52.0)
MCH: 32 pg (ref 26.0–34.0)
MCHC: 32 g/dL (ref 30.0–36.0)
RDW: 14 % (ref 11.5–15.5)

## 2012-02-05 LAB — GLUCOSE, CAPILLARY: Glucose-Capillary: 219 mg/dL — ABNORMAL HIGH (ref 70–99)

## 2012-02-05 MED ORDER — ASPIRIN 325 MG PO TBEC: 325.0000 mg | DELAYED_RELEASE_TABLET | Freq: Every day | ORAL | Status: DC

## 2012-02-05 MED ORDER — CLOPIDOGREL BISULFATE 75 MG PO TABS: 75.0000 mg | ORAL_TABLET | Freq: Every day | ORAL | Status: DC

## 2012-02-05 NOTE — Progress Notes (Signed)
[facility]  Subjective: No complaints. Denies pain.  Objective: Vital signs in last 24 hours: Temp:  [97.4 F (36.3 C)-98.3 F (36.8 C)] 97.8 F (36.6 C) (01/08 0530) Pulse Rate:  [38-47] 41  (01/08 0530) Resp:  [9-24] 24  (01/08 0530) BP: (128-186)/(40-128) 168/54 mmHg (01/08 0530) SpO2:  [96 %-98 %] 96 % (01/08 0530) Weight:  [96.163 kg (212 lb)-96.6 kg (212 lb 15.4 oz)] 96.6 kg (212 lb 15.4 oz) (01/08 0001) Last BM Date: 02/03/12  Intake/Output from previous day: 01/07 0701 - 01/08 0700 In: 1315 [P.O.:640; I.V.:675] Out: 725 [Urine:725] Intake/Output this shift:    Medications Current Facility-Administered Medications  Medication Dose Route Frequency Provider Last Rate Last Dose  . acetaminophen (TYLENOL) tablet 650 mg  650 mg Oral Q4H PRN Marc Gess, MD      . allopurinol (ZYLOPRIM) tablet 300 mg  300 mg Oral Daily Marc Gess, MD   300 mg at 02/04/12 1653  . aspirin EC tablet 325 mg  325 mg Oral Daily Marc Gess, MD      . clopidogrel (PLAVIX) tablet 75 mg  75 mg Oral Q breakfast Marc Gess, MD      . gabapentin (NEURONTIN) tablet 1,200 mg  1,200 mg Oral Q8H Marc Gess, MD   1,200 mg at 02/05/12 0539  . glimepiride (AMARYL) tablet 4 mg  4 mg Oral BID WC Marc Gess, MD   4 mg at 02/04/12 1654  . hydrALAZINE (APRESOLINE) injection 10 mg  10 mg Intravenous PRN Marc Gess, MD   10 mg at 02/04/12 1649  . HYDROcodone-acetaminophen (NORCO) 10-325 MG per tablet 2 tablet  2 tablet Oral Q8H Marc Gess, MD   2 tablet at 02/05/12 0539  . lisinopril (PRINIVIL,ZESTRIL) tablet 20 mg  20 mg Oral Daily Marc Gess, MD      . morphine 2 MG/ML injection 2 mg  2 mg Intravenous Q1H PRN Marc Gess, MD      . ondansetron [facility]) injection 4 mg  4 mg Intravenous Q6H PRN Marc Gess, MD      . pantoprazole (PROTONIX) EC tablet 40 mg  40 mg Oral Daily Marc Gess, MD      . pioglitazone (ACTOS)  tablet 30 mg  30 mg Oral Q breakfast Marc Gess, MD   30 mg at 02/04/12 1658  . predniSONE (DELTASONE) tablet 10 mg  10 mg Oral Daily Marc Gess, MD   10 mg at 02/04/12 1955  . simvastatin (ZOCOR) tablet 40 mg  40 mg Oral q1800 Marc Gess, MD   40 mg at 02/04/12 1955    PE: General appearance: alert, cooperative and no distress Lungs: clear to auscultation bilaterally Heart: regular rhythm, rate slow, no murmur Extremities: trace LEE Pulses: 2+ radials, 1+ DPs Skin: warm and dry, no ecchymosis, erythema or hematoma at left groin cath site Neurologic: Grossly normal  Lab Results:   [facility] 02/05/12 0645  WBC 7.5  HGB 13.3  HCT 41.5  PLT 198    Studies/Results: HEMODYNAMICS:  AO SYSTOLIC/AO DIASTOLIC: 189/71  ANGIOGRAPHIC RESULTS:  1: Abdominal aortogram-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 With a 30 mm gradient noted with endhole pullback after administration of 30 micrograms of intra-arterial nitroglycerin glycerin. The SFA was occluded at its origin a  reconstituted in the abductor canal. There was two-vessel run off with an occluded anterior tibial  3: Right lower extremity- the proximal and mid third of the right SFA was diffusely diseased and calcified. There is a short segment calcified occlusion at the junction of the middle and distal third of the SFA reconstituting just beyond this with one-vessel runoff peroneal. The anterior posterior tib at the ankle joint reconstituted by peroneal collaterals  IMPRESSION:Marc [name] has a moderate left iliac lesion and bilateral SFA occlusion with infrapopliteal disease. He'll leave extensive procedure on his right SFA including diamondback orbital rotational atherectomy, PTA and stenting. We'll proceed with PTA and stenting of his left common/external iliac  artery today and state the right SFA procedure.  Procedure description: The short 5 French sheath was exchanged over an Psychologist, educational for a 7 [place] Bright tipped sheath. A total of 178 cc of contrast was administered to the patient. The ACT was measured at 252. The lesion was primarily stented with a 14 mm x 40 mm long Cordis Smart Nitinol self-expanding stent. It was post dilated with a 9 x 30 mm balloon at 4 atmospheres resulting in reduction of a 60% stenosis to 0% residual with excellent flow. The patient tolerated the procedure well. The sheath will be removed once the ACT falls below 170 and pressure will be held on the groin to achieve hemostasis. Patient was gently hydrated overnight and discharged home in the morning. He will be treated with aspirin Plavix. Plan will be for staged right SFA diamondback orbital rotational atherectomy, PTA and stenting. I discussed the case with the referring doctor, Dr. Windle Guard.  Marc Gess MD, [facility]  02/04/2012  1:19 PM  Assessment/Plan  Principal Problem:  *PVD- s/p  PTA and stenting of left common/external iliac artery 1/7 Active Problems:  Diabetes mellitus  Hypertension Plan: S/P PTA and stenting of the left common/external iliac artery with a Cordis Smart Nitinol self-expanding stent on 1/7. He is on ASA and Plavix. Plan to undergo staged right SFA diamondback orbital rotational atherectomy, PTA and stenting. Pt has been bradycardic with rates in the 40's and 30s. Telemetry now shows sinus brady with a rate of 42. He has been asymptomatic. He is on no AV nodal blocking drugs. Pt reports that he has always had slow HRs. BP elevated. Dr. Andrey Campanile is managing BP meds. Will check BMP to assess renal function. If ok, plan for D/C today. Consider checking P2Y12 in 30 days.    LOS: 1 day    Marc [name] 02/05/2012 8:26 AM

## 2012-02-05 NOTE — Progress Notes (Signed)
Pt. Seen and examined. Agree with the NP/PA-C note as written.  S/p successful PCI to the left CIA yesterday without complication. There is extensive right SFA disease which will require diamondback atherectomy at a later date. He is without pain, groin without bruit or hematoma. Ok for discharge home today for outpatient staged procedure per Dr. Allyson Sabal.  Chrystie Nose, MD, [facility] Attending Cardiologist [facility]

## 2012-02-05 NOTE — Discharge Summary (Signed)
Physician Discharge Summary  Patient ID: Marc [name] MRN: [id number] DOB/AGE: Feb 23, 1940 72 y.o.  Admit date: 02/04/2012 Discharge date: 02/05/2012  Admission Diagnoses: PAD  Discharge Diagnoses:  Principal Problem:  *PVD- s/p  PTA and stenting of left common/external iliac artery 1/7 Active Problems:  Diabetes mellitus  Hypertension  Bradycardia:  Normal condition for this patient. Previously worked up.   Discharged Condition: stable  Hospital Course:   Marc [name] is a 72 year old moderately overweight married Caucasian male father of 2 who works at farming and raising cattle. He was referred through the courtesy of Dr. Andrey Campanile for peripheral vascular evaluation because of lifestyle limiting claudication. His risk factors include 120-pack-year history tobacco abuse, smoking 2 pack packs a day since age 26. He is treated diabetes, hypertension and hyperlipidemia. His father died of a myocardial infarction at age 73. He said claudication for the last 2-3 years, worse over the last several months, right greater than the left at less than one block. He had Dopplers performed revealing a right ABI 0.36 and 11.53 with what appears to be occluded occluded SFAs bilaterally with tibial disease as well. The Myoview stress test was low risk. He presented for angiography and potential endovascular intervention.  The patient underwent successful PTA and stenting of his left common/external iliac artery but will need staged intervention to his right SFA including diamondback orbital rotational atherectomy, PTA and stenting.  Severe bradycardia was observed on telemetry with HR decreasing to 32 at night.  This is NOT a new finding and the patient was asymptomatic.  Previous workup ~six years ago.  Consider new work-up if symptomatic.  BP management per Dr. Andrey Campanile.   The patient was seen by Dr. Rennis Golden who felt he was stable for DC home.  Follow up with Dr. Allyson Sabal for Staged intervention.   ASA/Plavix.  Consults: None  Significant Diagnostic Studies:  PROCEDURE DESCRIPTION:  The patient was brought to the second floor  [facility] lab in the postabsorptive state. He was premedicated withValium 5 mg by mouth, IV Versed and fentanyl. His left groinwas prepped and shaved in usual sterile fashion. Xylocaine 1% was used for local anesthesia. A 5 French sheath was inserted into the left common femoral artery using standard Seldinger technique. The patient received 6000 units of heparin intravenously. A 5 French pigtail was used for abdominal aortography with bifemoral graft using bolus chase digital subtraction step table technique. Visipaque dye was used for the entirety of the case. Retrograde aortic, left ventricular and pulmonary pressures were recorded.  HEMODYNAMICS:  AO SYSTOLIC/AO DIASTOLIC: 189/71  ANGIOGRAPHIC RESULTS:  1: Abdominal aortogram-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 With a 30 mm gradient noted with endhole pullback after administration of 30 micrograms of intra-arterial nitroglycerin glycerin. The SFA was occluded at its origin a reconstituted in the abductor canal. There was two-vessel run off with an occluded anterior tibial  3: Right lower extremity- the proximal and mid third of the right SFA was diffusely diseased and calcified. There is a short segment calcified occlusion at the junction of the middle and distal third of the SFA reconstituting just beyond this with one-vessel runoff peroneal. The anterior posterior tib at the ankle joint reconstituted by peroneal collaterals  IMPRESSION:Marc [name] has a moderate left iliac lesion and bilateral SFA occlusion with infrapopliteal disease. He'll leave extensive procedure on his right SFA including  diamondback orbital rotational atherectomy,  PTA and stenting. We'll proceed with PTA and stenting of his left common/external iliac artery today and state the right SFA procedure.  Procedure description: The short 5 French sheath was exchanged over an Psychologist, educational for a 7 [place] Bright tipped sheath. A total of 178 cc of contrast was administered to the patient. The ACT was measured at 252. The lesion was primarily stented with a 14 mm x 40 mm long Cordis Smart Nitinol self-expanding stent. It was post dilated with a 9 x 30 mm balloon at 4 atmospheres resulting in reduction of a 60% stenosis to 0% residual with excellent flow. The patient tolerated the procedure well. The sheath will be removed once the ACT falls below 170 and pressure will be held on the groin to achieve hemostasis. Patient was gently hydrated overnight and discharged home in the morning. He will be treated with aspirin Plavix. Plan will be for staged right SFA diamondback orbital rotational atherectomy, PTA and stenting. I discussed the case with the referring doctor, Dr. Windle Guard.  Runell Gess MD, [facility]  02/04/2012  BMET    Component Value Date/Time   NA 141 02/05/2012 0818   K 4.2 02/05/2012 0818   CL 104 02/05/2012 0818   CO2 26 02/05/2012 0818   GLUCOSE 200* 02/05/2012 0818   BUN 20 02/05/2012 0818   CREATININE 1.21 02/05/2012 0818   CALCIUM 9.8 02/05/2012 0818   GFRNONAA 58* 02/05/2012 0818   GFRAA 67* 02/05/2012 0818    CBC    Component Value Date/Time   WBC 7.5 02/05/2012 0645   RBC 4.15* 02/05/2012 0645   HGB 13.3 02/05/2012 0645   HCT 41.5 02/05/2012 0645   PLT 198 02/05/2012 0645   MCV 100.0 02/05/2012 0645   MCH 32.0 02/05/2012 0645   MCHC 32.0 02/05/2012 0645   RDW 14.0 02/05/2012 0645   LYMPHSABS 2.9 05/21/2010 1643   MONOABS 0.6 05/21/2010 1643   EOSABS 0.3 05/21/2010 1643   BASOSABS 0.1 05/21/2010 1643    Treatments: See above  Discharge Exam: Blood pressure 176/64, pulse 46, temperature 97.7 F (36.5 C), temperature source Oral, resp. rate 14, height 6\' 2"   (1.88 m), weight 96.6 kg (212 lb 15.4 oz), SpO2 96.00%.   Disposition:   Discharge Orders    Future Orders Please Complete By Expires   Diet - low sodium heart healthy      Increase activity slowly      Discharge instructions      Comments:   Recommend no driving for 3 days. No lifting more than 1/2 gallon of milk.       Medication List     As of 02/05/2012 10:23 AM    STOP taking these medications         metoprolol 100 MG tablet   Commonly known as: LOPRESSOR      TAKE these medications         allopurinol 300 MG tablet   Commonly known as: ZYLOPRIM   Take 300 mg by mouth daily.      aspirin 325 MG EC tablet   Take 1 tablet (325 mg total) by mouth daily.      clopidogrel 75 MG tablet   Commonly known as: PLAVIX   Take 1 tablet (75 mg total) by mouth daily with breakfast.      gabapentin 600 MG tablet   Commonly known as: NEURONTIN   Take 1,200 mg by mouth every 8 (eight) hours.  glimepiride 4 MG tablet   Commonly known as: AMARYL   Take 4 mg by mouth 2 (two) times daily.      HYDROcodone-acetaminophen 10-325 MG per tablet   Commonly known as: NORCO   Take 2 tablets by mouth every 8 (eight) hours.      lisinopril 20 MG tablet   Commonly known as: PRINIVIL,ZESTRIL   Take 20 mg by mouth daily.      lovastatin 40 MG tablet   Commonly known as: MEVACOR   Take 40 mg by mouth at bedtime.      omeprazole 20 MG tablet   Commonly known as: PRILOSEC OTC   Take 20 mg by mouth daily.      pioglitazone 30 MG tablet   Commonly known as: ACTOS   Take 30 mg by mouth daily.      predniSONE 10 MG tablet   Commonly known as: DELTASONE   Take 10 mg by mouth daily.           Follow-up Information    Follow up with Runell Gess, MD. (office will call you)    Contact information:   [address] [place] [address] [phone]          Signed: Wilburt Finlay 02/05/2012, 10:23 AM

## 2012-02-19 ENCOUNTER — Other Ambulatory Visit: Payer: Self-pay | Admitting: Family Medicine

## 2012-02-19 DIAGNOSIS — M545 Low back pain: Secondary | ICD-10-CM

## 2012-02-23 ENCOUNTER — Ambulatory Visit
Admission: RE | Admit: 2012-02-23 | Discharge: 2012-02-23 | Disposition: A | Discharge status: Home / Self Care | Payer: Medicare Other | Source: Ambulatory Visit | Attending: Family Medicine | Admitting: Family Medicine

## 2012-02-23 DIAGNOSIS — M545 Low back pain: Secondary | ICD-10-CM

## 2012-02-25 ENCOUNTER — Encounter (HOSPITAL_COMMUNITY): Payer: Self-pay | Admitting: Pharmacy Technician

## 2012-03-10 ENCOUNTER — Other Ambulatory Visit: Payer: Self-pay | Admitting: Cardiology

## 2012-03-10 ENCOUNTER — Ambulatory Visit (HOSPITAL_COMMUNITY)
Admission: RE | Admit: 2012-03-10 | Discharge: 2012-03-11 | Disposition: A | Discharge status: Home / Self Care | Payer: Medicare Other | Source: Ambulatory Visit | Attending: Cardiovascular Disease | Admitting: Cardiovascular Disease

## 2012-03-10 ENCOUNTER — Encounter (HOSPITAL_COMMUNITY): Payer: Self-pay | Admitting: Cardiology

## 2012-03-10 ENCOUNTER — Encounter (HOSPITAL_COMMUNITY)
Admission: RE | Disposition: A | Discharge status: Home / Self Care | Payer: Self-pay | Source: Ambulatory Visit | Attending: Cardiovascular Disease

## 2012-03-10 DIAGNOSIS — F17200 Nicotine dependence, unspecified, uncomplicated: Secondary | ICD-10-CM | POA: Insufficient documentation

## 2012-03-10 DIAGNOSIS — I70209 Unspecified atherosclerosis of native arteries of extremities, unspecified extremity: Secondary | ICD-10-CM | POA: Insufficient documentation

## 2012-03-10 DIAGNOSIS — Z720 Tobacco use: Secondary | ICD-10-CM | POA: Diagnosis present

## 2012-03-10 DIAGNOSIS — E119 Type 2 diabetes mellitus without complications: Secondary | ICD-10-CM | POA: Insufficient documentation

## 2012-03-10 DIAGNOSIS — R001 Bradycardia, unspecified: Secondary | ICD-10-CM | POA: Diagnosis present

## 2012-03-10 DIAGNOSIS — I10 Essential (primary) hypertension: Secondary | ICD-10-CM

## 2012-03-10 DIAGNOSIS — I739 Peripheral vascular disease, unspecified: Secondary | ICD-10-CM | POA: Diagnosis present

## 2012-03-10 DIAGNOSIS — I119 Hypertensive heart disease without heart failure: Secondary | ICD-10-CM | POA: Diagnosis present

## 2012-03-10 DIAGNOSIS — E785 Hyperlipidemia, unspecified: Secondary | ICD-10-CM | POA: Insufficient documentation

## 2012-03-10 LAB — GLUCOSE, CAPILLARY
Glucose-Capillary: 143 mg/dL — ABNORMAL HIGH (ref 70–99)
Glucose-Capillary: 105 mg/dL — ABNORMAL HIGH (ref 70–99)
Glucose-Capillary: 162 mg/dL — ABNORMAL HIGH (ref 70–99)

## 2012-03-10 LAB — POCT ACTIVATED CLOTTING TIME
Activated Clotting Time: 209 seconds
Activated Clotting Time: 219 seconds
Activated Clotting Time: 225 seconds
Activated Clotting Time: 202 seconds
Activated Clotting Time: 170 seconds

## 2012-03-10 SURGERY — PERCUTANEOUS STENT INTERVENTION
Laterality: Right

## 2012-03-10 MED ORDER — MORPHINE SULFATE 2 MG/ML IJ SOLN
2.0000 mg | INTRAMUSCULAR | Status: DC | PRN
  Administered 2012-03-10: 2 mg via INTRAVENOUS
  Filled 2012-03-10: qty 1

## 2012-03-10 MED ORDER — SODIUM CHLORIDE 0.9 % IV SOLN
INTRAVENOUS | Status: AC
  Administered 2012-03-10: 13:00:00 via INTRAVENOUS

## 2012-03-10 MED ORDER — SIMVASTATIN 5 MG PO TABS
5.0000 mg | ORAL_TABLET | Freq: Every day | ORAL | Status: DC
  Filled 2012-03-10: qty 1

## 2012-03-10 MED ORDER — LIDOCAINE HCL (PF) 1 % IJ SOLN
INTRAMUSCULAR | Status: AC
  Filled 2012-03-10: qty 30

## 2012-03-10 MED ORDER — ALLOPURINOL 300 MG PO TABS
300.0000 mg | ORAL_TABLET | Freq: Every day | ORAL | Status: DC
  Administered 2012-03-11: 300 mg via ORAL
  Filled 2012-03-10: qty 1

## 2012-03-10 MED ORDER — HYDRALAZINE HCL 20 MG/ML IJ SOLN
10.0000 mg | INTRAMUSCULAR | Status: DC
  Filled 2012-03-10: qty 0.5

## 2012-03-10 MED ORDER — CLOPIDOGREL BISULFATE 75 MG PO TABS
75.0000 mg | ORAL_TABLET | Freq: Every day | ORAL | Status: DC
  Administered 2012-03-11: 11:00:00 75 mg via ORAL
  Filled 2012-03-10: qty 1

## 2012-03-10 MED ORDER — GLIMEPIRIDE 4 MG PO TABS
4.0000 mg | ORAL_TABLET | Freq: Two times a day (BID) | ORAL | Status: DC
  Administered 2012-03-10 – 2012-03-11 (×2): 4 mg via ORAL
  Filled 2012-03-10 (×5): qty 1

## 2012-03-10 MED ORDER — PIOGLITAZONE HCL 30 MG PO TABS
30.0000 mg | ORAL_TABLET | Freq: Every day | ORAL | Status: DC
  Administered 2012-03-11: 30 mg via ORAL
  Filled 2012-03-10: qty 1

## 2012-03-10 MED ORDER — ACETAMINOPHEN 325 MG PO TABS: 650.0000 mg | ORAL_TABLET | ORAL | Status: DC | PRN

## 2012-03-10 MED ORDER — FENTANYL CITRATE 0.05 MG/ML IJ SOLN
INTRAMUSCULAR | Status: AC
  Filled 2012-03-10: qty 2

## 2012-03-10 MED ORDER — PREDNISONE 10 MG PO TABS
10.0000 mg | ORAL_TABLET | Freq: Every day | ORAL | Status: DC
  Administered 2012-03-11: 11:00:00 10 mg via ORAL
  Filled 2012-03-10: qty 1

## 2012-03-10 MED ORDER — MIDAZOLAM HCL 2 MG/2ML IJ SOLN
INTRAMUSCULAR | Status: AC
  Filled 2012-03-10: qty 2

## 2012-03-10 MED ORDER — PANTOPRAZOLE SODIUM 40 MG PO TBEC: 40.0000 mg | DELAYED_RELEASE_TABLET | Freq: Every day | ORAL | Status: DC

## 2012-03-10 MED ORDER — LISINOPRIL 20 MG PO TABS
20.0000 mg | ORAL_TABLET | Freq: Every day | ORAL | Status: DC
  Administered 2012-03-11: 11:00:00 20 mg via ORAL
  Filled 2012-03-10: qty 1

## 2012-03-10 MED ORDER — SODIUM CHLORIDE 0.9 % IJ SOLN: 3.0000 mL | INTRAMUSCULAR | Status: DC | PRN

## 2012-03-10 MED ORDER — SODIUM CHLORIDE 0.9 % IV SOLN
INTRAVENOUS | Status: DC
  Administered 2012-03-10: 09:00:00 via INTRAVENOUS

## 2012-03-10 MED ORDER — ASPIRIN EC 325 MG PO TBEC
325.0000 mg | DELAYED_RELEASE_TABLET | Freq: Every day | ORAL | Status: DC
  Administered 2012-03-11: 325 mg via ORAL
  Filled 2012-03-10: qty 1

## 2012-03-10 MED ORDER — ALPRAZOLAM 0.25 MG PO TABS: 0.2500 mg | ORAL_TABLET | Freq: Three times a day (TID) | ORAL | Status: DC | PRN

## 2012-03-10 MED ORDER — ZOLPIDEM TARTRATE 5 MG PO TABS: 5.0000 mg | ORAL_TABLET | Freq: Every evening | ORAL | Status: DC | PRN

## 2012-03-10 MED ORDER — HEPARIN SODIUM (PORCINE) 1000 UNIT/ML IJ SOLN
INTRAMUSCULAR | Status: AC
  Filled 2012-03-10: qty 1

## 2012-03-10 MED ORDER — GABAPENTIN 600 MG PO TABS
1200.0000 mg | ORAL_TABLET | Freq: Three times a day (TID) | ORAL | Status: DC
  Administered 2012-03-10 – 2012-03-11 (×2): 1200 mg via ORAL
  Filled 2012-03-10 (×6): qty 2

## 2012-03-10 MED ORDER — ONDANSETRON HCL 4 MG/2ML IJ SOLN: 4.0000 mg | Freq: Four times a day (QID) | INTRAMUSCULAR | Status: DC | PRN

## 2012-03-10 MED ORDER — TRAMADOL HCL 50 MG PO TABS: 50.0000 mg | ORAL_TABLET | Freq: Four times a day (QID) | ORAL | Status: DC | PRN

## 2012-03-10 MED ORDER — HEPARIN (PORCINE) IN NACL 2-0.9 UNIT/ML-% IJ SOLN
INTRAMUSCULAR | Status: AC
  Filled 2012-03-10: qty 1500

## 2012-03-10 MED ORDER — HYDROCODONE-ACETAMINOPHEN 10-325 MG PO TABS
2.0000 | ORAL_TABLET | Freq: Three times a day (TID) | ORAL | Status: DC
  Administered 2012-03-10 – 2012-03-11 (×3): 2 via ORAL
  Filled 2012-03-10 (×3): qty 2

## 2012-03-10 MED ORDER — OMEPRAZOLE MAGNESIUM 20 MG PO TBEC: 20.0000 mg | DELAYED_RELEASE_TABLET | Freq: Every day | ORAL | Status: DC

## 2012-03-10 NOTE — CV Procedure (Signed)
HUMPHREY GUERREIRO is a 72 y.o. male    [id number] LOCATION:  FACILITY: [facility]  PHYSICIAN: Nanetta Batty, M.D. Jun 09, 1940   DATE OF PROCEDURE:  03/10/2012  DATE OF DISCHARGE:  [facility]  PV Intervention    History obtained from chart review. Mr. Holdren is a 72 year old mildly overweight married Caucasian male with positive cardiac risk factors including 120-pack-year history of tobacco abuse having smoked 2 packs a day to 14, diabetes, hypertension transient hyperlipidemia. I started him in early January and stented his left external iliac artery. He has a known occluded left SFA throughout its entirety the short segment highly calcified total occlusion of the mid right SFA with one-vessel runoff. He has without any claudication. He presents now for angiography and potential intervention.   PROCEDURE DESCRIPTION:    The patient was brought to the second floor [facility] cath lab in the postabsorptive state. He was  premedicated with Valium 5 mg by mouth, IV Versed and fentanyl.[name] His left groinwas prepped and shaved in usual sterile fashion. Xylocaine 1% was used for local anesthesia. A 7 French  Destination sheath was inserted into the left common femoral artery using standard Seldinger technique. The patient received 6500 units  of heparin  intravenously.  A total of 107 cc of contrast was administered to the patient during the case. The ending ACT was 225.    HEMODYNAMICS:    AO SYSTOLIC/AO DIASTOLIC: 181/74    ANGIOGRAPHIC RESULTS:   Contralateral access was obtained with a crossover catheter, stiff angled Glidewire, 035 Rosen wire and a 7 [place] destination sheath. Using a 135 cm long angled NaviCross  endhole catheter along with a stiff angled 035 Glidewire I attempted to cross the short segment highly calcified the mid right SFA CTO unsuccessfully. I reached the distal cap But was subintimal. There was a 60% distal right external iliac artery  stenosis with an 80 mm pullback gradient noted as well. I primarily stented this was a 10 mm x 40 mm long at that Absolut Pro nitinol self-expanding stent and post dilated with a 7 x 2 balloon resulting reduction a 60% stenosis to less than 10% residual with excellent flow. The sheath was then withdrawn across the bifurcation and secured. The patient left the Cath Lab in stable condition.   IMPRESSION:unsuccessful attempt at crossing a highly calcified short segment right SFA CTO with successful distal right external iliac artery stenting for lifestyle limiting claudication. The patient was already on aspirin and Plavix. The sheath will be removed removed once the ACT falls below 170. Pressure will be held on the groin to achieve hemostasis. Patient will be hydrated overnight and discharged home in the morning. He left the Cath Lab in stable condition. He will have a followup Doppler after discharge I will see back in the office after that.  Runell Gess MD, [facility] 03/10/2012 11:45 AM

## 2012-03-10 NOTE — Progress Notes (Signed)
Site area: left groin  Site Prior to Removal:  Level 0  Pressure Applied For 20 MINUTES    Minutes Beginning at 1430  Manual:   yes  Patient Status During Pull:  stable  Post Pull Groin Site:  Level 0  Post Pull Instructions Given:  yes  Post Pull Pulses Present:  yes  Dressing Applied:  yes  Comments:

## 2012-03-10 NOTE — H&P (Signed)
  H & P will be scanned in.  Pt was reexamined and existing H & P reviewed. No changes found.  Runell Gess, MD [facility] 03/10/2012 10:16 AM

## 2012-03-10 NOTE — Progress Notes (Signed)
Pt very ornery tonight with multiple complaints about any and everything.  Pt states he does not want anyone coming into his room from 2300-0500 after he gets his pain meds and goes to sleep.  [facility] made aware of this fact while already on the phone with him about another patient. Will continue to monitor and try to adjust care to his request.

## 2012-03-10 NOTE — Progress Notes (Signed)
Pt has no diet order.  Dr. Herbie Baltimore made aware and diet order received for carb modified.

## 2012-03-11 ENCOUNTER — Other Ambulatory Visit (HOSPITAL_COMMUNITY): Payer: Self-pay | Admitting: Physician Assistant

## 2012-03-11 DIAGNOSIS — I739 Peripheral vascular disease, unspecified: Secondary | ICD-10-CM

## 2012-03-11 LAB — BASIC METABOLIC PANEL
BUN: 19 mg/dL (ref 6–23)
Calcium: 10 mg/dL (ref 8.4–10.5)
Chloride: 107 mEq/L (ref 96–112)
Creatinine, Ser: 1.33 mg/dL (ref 0.50–1.35)
GFR calc Af Amer: 60 mL/min — ABNORMAL LOW (ref >90)
GFR calc non Af Amer: 52 mL/min — ABNORMAL LOW (ref >90)

## 2012-03-11 LAB — CBC
HCT: 40 % (ref 39.0–52.0)
MCHC: 33 g/dL (ref 30.0–36.0)
Platelets: 197 10*3/uL (ref 150–400)
RDW: 14.4 % (ref 11.5–15.5)
WBC: 8.2 10*3/uL (ref 4.0–10.5)

## 2012-03-11 NOTE — Progress Notes (Signed)
I have seen and evaluated the patient this AM along with Wilburt Finlay, PA. I agree with his findings, examination as well as impression recommendations.  Stable post partial PV revascularization -- PTA-stent of dREIA.  Groin site d/c/i & not tender.  He is ready for d/c.  BP & HR stable for him -- ROV with Physician Extender & LEA Dopplers in ~1-2 weeks followed by Appt with Dr. Erlene Quan.  Prior to d/c, I spend 7-10 minutes counseling the patient about smoking cessation.  He really does not seem interested in quitting, stating that it is his only vice & "you gotta die of something."   Marykay Lex, M.D., M.S. [facility] [address] [place], [place]  [address]  [phone] Pager # [phone] 03/11/2012 12:04 PM

## 2012-03-11 NOTE — Progress Notes (Signed)
[facility]  Subjective: No complaints  Objective: Vital signs in last 24 hours: Temp:  [97.4 F (36.3 C)-98.3 F (36.8 C)] 97.4 F (36.3 C) (02/12 0600) Pulse Rate:  [39-70] 50 (02/11 2300) Resp:  [10-18] 13 (02/11 2300) BP: (128-175)/(58-89) 128/61 mmHg (02/11 2300) SpO2:  [96 %-99 %] 97 % (02/12 0600) Weight:  [97.523 kg (215 lb)-98.4 kg (216 lb 14.9 oz)] 98.4 kg (216 lb 14.9 oz) (02/12 0108) Last BM Date: 03/10/12  Intake/Output from previous day: 02/11 0701 - 02/12 0700 In: 1496.3 [P.O.:840; I.V.:656.3] Out: 1200 [Urine:1200] Intake/Output this shift:    Medications Current Facility-Administered Medications  Medication Dose Route Frequency Provider Last Rate Last Dose  . acetaminophen (TYLENOL) tablet 650 mg  650 mg Oral Q4H PRN Runell Gess, MD      . allopurinol (ZYLOPRIM) tablet 300 mg  300 mg Oral Daily Eda Paschal [place], [place]      . ALPRAZolam Prudy Feeler) tablet 0.25 mg  0.25 mg Oral TID PRN Abelino Derrick, PA      . aspirin EC tablet 325 mg  325 mg Oral Daily Eda Paschal [place], [place]      . clopidogrel (PLAVIX) tablet 75 mg  75 mg Oral Q breakfast Eda Paschal [place], [place]      . gabapentin (NEURONTIN) tablet 1,200 mg  1,200 mg Oral [address] [place], [place]   1,200 mg at 03/11/12 1610  . glimepiride (AMARYL) tablet 4 mg  4 mg Oral BID WC Eda Paschal [facility], PA   4 mg at 03/10/12 2223  . hydrALAZINE (APRESOLINE) injection 10 mg  10 mg Intravenous UD Runell Gess, MD      . HYDROcodone-acetaminophen [facility] 10-325 MG per tablet 2 tablet  2 tablet Oral Q8H Nada Boozer, NP   2 tablet at 03/11/12 [phone]  . lisinopril (PRINIVIL,ZESTRIL) tablet 20 mg  20 mg Oral Daily Eda Paschal [place], [place]      . morphine 2 MG/ML injection 2 mg  2 mg Intravenous Q1H PRN Runell Gess, MD   2 mg at 03/10/12 1455  . ondansetron (ZOFRAN) injection 4 mg  4 mg Intravenous Q6H PRN Runell Gess, MD      . pantoprazole (PROTONIX) EC tablet 40 mg  40 mg Oral Daily Runell Gess, MD       . pioglitazone (ACTOS) tablet 30 mg  30 mg Oral Daily Eda Paschal [place], [place]      . predniSONE (DELTASONE) tablet 10 mg  10 mg Oral Daily Eda Paschal [place], [place]      . simvastatin (ZOCOR) tablet 5 mg  5 mg Oral q1800 Eda Paschal [place], [place]      . traMADol Janean Sark) tablet 50 mg  50 mg Oral Q6H PRN Abelino Derrick, PA      . zolpidem (AMBIEN) tablet 5 mg  5 mg Oral QHS PRN Abelino Derrick, PA        PE: General appearance: alert, cooperative and no distress Lungs: clear to auscultation bilaterally Heart: Rate slow and reg.  No MM Extremities: No LEE Pulses: Radials 2+ and symmetric right DP 1+ Left 0,  PTs 0.  both feet warm  Skin: Left groin cath site:  No hematoma, ecchymosis. Nontender Neurologic: Grossly normal  Lab Results:   Recent Labs  03/11/12 0620  WBC 8.2  HGB 13.2  HCT 40.0  PLT 197   BMET  Recent Labs  03/11/12 0620  NA 143  K  4.3  CL 107  CO2 29  GLUCOSE 117*  BUN 19  CREATININE 1.33  CALCIUM 10.0   HEMODYNAMICS:  AO SYSTOLIC/AO DIASTOLIC: 181/74  ANGIOGRAPHIC RESULTS:  Contralateral access was obtained with a crossover catheter, stiff angled Glidewire, 035 Rosen wire and a 7 [place] destination sheath. Using a 135 cm long angled NaviCross endhole catheter along with a stiff angled 035 Glidewire I attempted to cross the short segment highly calcified the mid right SFA CTO unsuccessfully. I reached the distal cap But was subintimal. There was a 60% distal right external iliac artery stenosis with an 80 mm pullback gradient noted as well. I primarily stented this was a 10 mm x 40 mm long at that Absolut Pro nitinol self-expanding stent and post dilated with a 7 x 2 balloon resulting reduction a 60% stenosis to less than 10% residual with excellent flow. The sheath was then withdrawn across the bifurcation and secured. The patient left the Cath Lab in stable condition.  IMPRESSION:unsuccessful attempt at crossing a highly calcified short segment right SFA CTO with successful  distal right external iliac artery stenting for lifestyle limiting claudication. The patient was already on aspirin and Plavix. The sheath will be removed removed once the ACT falls below 170. Pressure will be held on the groin to achieve hemostasis. Patient will be hydrated overnight and discharged home in the morning. He left the Cath Lab in stable condition. He will have a followup Doppler after discharge I will see back in the office after that.  Runell Gess MD, [facility]  03/10/2012  11:45 AM  Assessment/Plan  Principal Problem:   Claudication in peripheral vascular disease, life style limiting Active Problems:   PVD- s/p  PTA and stenting of left common/external iliac artery 1/7   Diabetes mellitus   Hypertension   Bradycardia:  Normal condition for this patient. Previously worked up.   S/P angioplasty with stent, to distal RT ext. iliac artery   Tobacco abuse disorder  Plan:  S/P unsuccessful attempt at crossing a highly calcified short segment right SFA CTO with successful distal right external iliac artery stenting for lifestyle limiting claudication.  BP and HR stable.  DC home today with follow up LEA dopplers.    LOS: 1 day    Nikie Cid 03/11/2012 8:14 AM

## 2012-03-25 ENCOUNTER — Ambulatory Visit (HOSPITAL_COMMUNITY)
Admission: RE | Admit: 2012-03-25 | Discharge: 2012-03-25 | Disposition: A | Discharge status: Home / Self Care | Payer: Medicare Other | Source: Ambulatory Visit | Attending: Cardiology | Admitting: Cardiology

## 2012-03-25 DIAGNOSIS — I739 Peripheral vascular disease, unspecified: Secondary | ICD-10-CM | POA: Insufficient documentation

## 2012-03-25 NOTE — Progress Notes (Signed)
Right Lower Ext. Arterial Duplex Completed. Chauncy Lean

## 2012-05-25 ENCOUNTER — Encounter: Payer: Self-pay | Admitting: Cardiovascular Disease

## 2012-06-26 ENCOUNTER — Ambulatory Visit
Admission: RE | Admit: 2012-06-26 | Discharge: 2012-06-26 | Disposition: A | Discharge status: Home / Self Care | Payer: Medicare Other | Source: Ambulatory Visit | Attending: Family Medicine | Admitting: Family Medicine

## 2012-06-26 ENCOUNTER — Other Ambulatory Visit: Payer: Self-pay | Admitting: Family Medicine

## 2012-06-26 DIAGNOSIS — R52 Pain, unspecified: Secondary | ICD-10-CM

## 2012-07-03 ENCOUNTER — Emergency Department (HOSPITAL_COMMUNITY): Payer: Medicare Other

## 2012-07-03 ENCOUNTER — Emergency Department (HOSPITAL_COMMUNITY)
Admission: EM | Admit: 2012-07-03 | Discharge: 2012-07-03 | Disposition: A | Discharge status: Home / Self Care | Payer: Medicare Other | Attending: Emergency Medicine | Admitting: Emergency Medicine

## 2012-07-03 ENCOUNTER — Encounter (HOSPITAL_COMMUNITY): Payer: Self-pay | Admitting: *Deleted

## 2012-07-03 ENCOUNTER — Telehealth: Payer: Self-pay | Admitting: *Deleted

## 2012-07-03 DIAGNOSIS — Z9861 Coronary angioplasty status: Secondary | ICD-10-CM | POA: Insufficient documentation

## 2012-07-03 DIAGNOSIS — E7800 Pure hypercholesterolemia, unspecified: Secondary | ICD-10-CM | POA: Insufficient documentation

## 2012-07-03 DIAGNOSIS — M129 Arthropathy, unspecified: Secondary | ICD-10-CM | POA: Insufficient documentation

## 2012-07-03 DIAGNOSIS — IMO0002 Reserved for concepts with insufficient information to code with codable children: Secondary | ICD-10-CM | POA: Insufficient documentation

## 2012-07-03 DIAGNOSIS — M5430 Sciatica, unspecified side: Secondary | ICD-10-CM | POA: Insufficient documentation

## 2012-07-03 DIAGNOSIS — I10 Essential (primary) hypertension: Secondary | ICD-10-CM | POA: Insufficient documentation

## 2012-07-03 DIAGNOSIS — Z860100 Personal history of colon polyps, unspecified: Secondary | ICD-10-CM | POA: Insufficient documentation

## 2012-07-03 DIAGNOSIS — Z981 Arthrodesis status: Secondary | ICD-10-CM | POA: Insufficient documentation

## 2012-07-03 DIAGNOSIS — Z79899 Other long term (current) drug therapy: Secondary | ICD-10-CM | POA: Insufficient documentation

## 2012-07-03 DIAGNOSIS — Z872 Personal history of diseases of the skin and subcutaneous tissue: Secondary | ICD-10-CM | POA: Insufficient documentation

## 2012-07-03 DIAGNOSIS — Z8709 Personal history of other diseases of the respiratory system: Secondary | ICD-10-CM | POA: Insufficient documentation

## 2012-07-03 DIAGNOSIS — E119 Type 2 diabetes mellitus without complications: Secondary | ICD-10-CM | POA: Insufficient documentation

## 2012-07-03 DIAGNOSIS — F17200 Nicotine dependence, unspecified, uncomplicated: Secondary | ICD-10-CM | POA: Insufficient documentation

## 2012-07-03 DIAGNOSIS — M5432 Sciatica, left side: Secondary | ICD-10-CM

## 2012-07-03 DIAGNOSIS — Z8719 Personal history of other diseases of the digestive system: Secondary | ICD-10-CM | POA: Insufficient documentation

## 2012-07-03 DIAGNOSIS — I739 Peripheral vascular disease, unspecified: Secondary | ICD-10-CM | POA: Insufficient documentation

## 2012-07-03 DIAGNOSIS — M25559 Pain in unspecified hip: Secondary | ICD-10-CM | POA: Insufficient documentation

## 2012-07-03 DIAGNOSIS — Z8679 Personal history of other diseases of the circulatory system: Secondary | ICD-10-CM | POA: Insufficient documentation

## 2012-07-03 DIAGNOSIS — Z8739 Personal history of other diseases of the musculoskeletal system and connective tissue: Secondary | ICD-10-CM | POA: Insufficient documentation

## 2012-07-03 DIAGNOSIS — Z7982 Long term (current) use of aspirin: Secondary | ICD-10-CM | POA: Insufficient documentation

## 2012-07-03 MED ORDER — HYDROMORPHONE HCL PF 2 MG/ML IJ SOLN
2.0000 mg | Freq: Once | INTRAMUSCULAR | Status: AC
  Administered 2012-07-03: 2 mg via INTRAMUSCULAR
  Filled 2012-07-03: qty 1

## 2012-07-03 MED ORDER — ONDANSETRON 4 MG PO TBDP
4.0000 mg | ORAL_TABLET | Freq: Once | ORAL | Status: AC
  Administered 2012-07-03: 4 mg via ORAL
  Filled 2012-07-03: qty 1

## 2012-07-03 MED ORDER — HYDROMORPHONE HCL PF 1 MG/ML IJ SOLN
1.0000 mg | Freq: Once | INTRAMUSCULAR | Status: AC
  Administered 2012-07-03: 1 mg via INTRAVENOUS
  Filled 2012-07-03: qty 1

## 2012-07-03 MED ORDER — METHOCARBAMOL 500 MG PO TABS: 500.0000 mg | ORAL_TABLET | Freq: Two times a day (BID) | ORAL | Status: DC

## 2012-07-03 MED ORDER — IBUPROFEN 600 MG PO TABS: 600.0000 mg | ORAL_TABLET | Freq: Three times a day (TID) | ORAL | Status: DC | PRN

## 2012-07-03 MED ORDER — PREDNISONE 50 MG PO TABS: 50.0000 mg | ORAL_TABLET | Freq: Every day | ORAL | Status: DC

## 2012-07-03 MED ORDER — DIAZEPAM 2 MG PO TABS
2.0000 mg | ORAL_TABLET | Freq: Once | ORAL | Status: AC
  Administered 2012-07-03: 2 mg via ORAL
  Filled 2012-07-03: qty 1

## 2012-07-03 MED ORDER — PREDNISONE 20 MG PO TABS
60.0000 mg | ORAL_TABLET | Freq: Once | ORAL | Status: AC
  Administered 2012-07-03: 60 mg via ORAL
  Filled 2012-07-03: qty 3

## 2012-07-03 NOTE — Telephone Encounter (Signed)
Ruben Reason stated pt is in clinic now and is in a lot of pain.  Wants to know if pt can d/c his Plavix and ASA for 5-7 days so he can have an epidural.  Fax [phone].  Paper chart requested.

## 2012-07-03 NOTE — Telephone Encounter (Signed)
Returned call and informed per instructions by MD/PA.  Verbalized understanding and agreed w/ plan.  Form faxed.

## 2012-07-03 NOTE — ED Notes (Signed)
The pt was given a shot in his back 1200 n today.  The pain returned at 1600

## 2012-07-03 NOTE — Telephone Encounter (Signed)
Message forwarded to L. Kilroy, PA-C for further instructions.  

## 2012-07-03 NOTE — ED Notes (Signed)
The pt has had back pain for 2 weeks  He has seen his doctor and he has been placed on prednisone and his pain med is not helping

## 2012-07-03 NOTE — ED Provider Notes (Signed)
History  This chart was scribed for Marc Pel, MS, PA-C working with Derwood Kaplan, MD by Ardelia Mems, ED Scribe. This patient was seen in room TR09C/TR09C and the patient's care was started at 6:36 PM.   CSN: [id number]  Arrival date & time 07/03/12  1813    Chief Complaint  Patient presents with  . Back Pain     The history is provided by the patient. No language interpreter was used.    HPI Comments: Marc [name] is a 72 y.o. male who presents to the Emergency Department complaining of constant, moderate back pain of 2 weeks duration that acutely worsened 2 days ago Pt states that he has pain in his left hip that radiates all the way down his leg. Pt has seen his doctor at [facility] today and was given a pain shot at 12:00 noon today. Pt states that the pain returned at 4:00 PM today and was unbearable. Pt states that he has been taking Vicodin at home for his pain without relief. Pt denies any injury that has exacerbated his pain. Pt reports that he had an MRI in January which revealed a mild, bulging disc. Pt discontinued his daily aspirin and plavix today so that he could get a back pain shot next week. Pt denies fever, chills, nausea, diarrhea, vomiting or any other symptoms.  Past Medical History  Diagnosis Date  . Hypertension   . Ulcer disease   . Colon polyp   . Gout   . Bradycardia   . Hypercholesteremia   . PAD (peripheral artery disease)   . Chronic bronchitis     "used to be very susceptible when I was a kid & smoking" (02/04/2012)  . Type II diabetes mellitus   . History of blood transfusion ~ 2007    "10 of them when I had stomach bleeding"  . Bleeding stomach ulcer 2007  . Arthritis     "qwhere that you can get it; mostly in my shoulders" (02/04/2012)  . Gout   . S/P angioplasty with stent, to distal RT ext. iliac artery 03/10/2012  . Tobacco abuse disorder 03/10/2012    Past Surgical History  Procedure Laterality Date  . Splenectomy   ~ 1957  . Anterior cervical decomp/discectomy fusion  ~ 2007  . Upper endoscopy w/ sclerotherapy  ~ 2007  . Polypectomy    . Colonoscopy    . Iliac artery stent      left common/notes (02/04/2012)  . Hernia repair  ~ 2007    UHR (02/04/2012)  . Tonsillectomy  ~ 1947  . Sfa    . Sfa  03/10/2012    ILLEAC STENOSIS    Family History  Problem Relation Age of Onset  . Colon cancer Paternal Uncle     Uncle  . Heart disease Father   . Heart disease Mother     History  Substance Use Topics  . Smoking status: Current Every Day Smoker -- 2.00 packs/day for 58 years    Types: Cigarettes  . Smokeless tobacco: Former Neurosurgeon    Types: Chew  . 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  Gastrointestinal: Negative for nausea, vomiting and diarrhea.  Musculoskeletal: Positive for back pain.   A complete  10 system review of systems was obtained and all systems are negative except as noted in the HPI and PMH.   Allergies  Review of patient's allergies indicates no known allergies.  Home Medications   Current Outpatient Rx  Name  Route  Sig  Dispense  Refill  . allopurinol (ZYLOPRIM) 300 MG tablet   Oral   Take 300 mg by mouth daily.         [name] aspirin EC 81 MG tablet   Oral   Take 81 mg by mouth daily.         . clopidogrel (PLAVIX) 75 MG tablet   Oral   Take 1 tablet (75 mg total) by mouth daily with breakfast.   30 tablet   11   . gabapentin (NEURONTIN) 600 MG tablet   Oral   Take 1,200 mg by mouth every 8 (eight) hours.          [name] glimepiride (AMARYL) 4 MG tablet   Oral   Take 4 mg by mouth 2 (two) times daily.          [name] HYDROcodone-acetaminophen (NORCO) 10-325 MG per tablet   Oral   Take 2 tablets by mouth every 8 (eight) hours.          . lidocaine (LIDODERM) 5 %    Transdermal   Place 1 patch onto the skin daily. Remove & Discard patch within 12 hours or as directed by MD         . lisinopril (PRINIVIL,ZESTRIL) 20 MG tablet   Oral   Take 20 mg by mouth daily.           [name] lovastatin (MEVACOR) 40 MG tablet   Oral   Take 40 mg by mouth at bedtime.         [name] omeprazole (PRILOSEC OTC) 20 MG tablet   Oral   Take 20 mg by mouth daily.          . pioglitazone (ACTOS) 30 MG tablet   Oral   Take 30 mg by mouth daily.         . predniSONE (DELTASONE) 10 MG tablet   Oral   Take 10 mg by mouth daily.         [name] ibuprofen (ADVIL,MOTRIN) 600 MG tablet   Oral   Take 1 tablet (600 mg total) by mouth every 8 (eight) hours as needed for pain.   12 tablet   0   . methocarbamol (ROBAXIN) 500 MG tablet   Oral   Take 1 tablet (500 mg total) by mouth 2 (two) times daily.   20 tablet   0   . predniSONE (DELTASONE) 50 MG tablet   Oral   Take 1 tablet (50 mg total) by mouth daily.   5 tablet   0     Triage Vitals: BP 160/90  Pulse 76  Temp(Src) 97.9 F (36.6 C)  Resp 24  SpO2 99%  Physical Exam  Nursing note and vitals reviewed. Constitutional: He is oriented to person, place, and time. He appears well-developed and well-nourished. He appears distressed (pt writhing in pain).  HENT:  Head: Normocephalic and atraumatic.  Eyes: EOM are normal.  Neck: Normal range of motion.  Cardiovascular: Normal rate, regular rhythm, normal heart sounds and intact distal pulses.   Pulmonary/Chest: Effort normal and breath sounds normal. No respiratory distress.  Abdominal: Soft. He exhibits no distension. There is no tenderness.  Genitourinary: Rectum normal.  Musculoskeletal: Normal range of motion.  Back:   Equal strength to bilateral lower extremities. Neurosensory function adequate to both legs. Skin color is normal. Skin is warm and moist. I see no step off deformity, no bony tenderness. Pt is able to ambulate without limp. Pain is  relieved when sitting in certain positions. ROM is decreased due to pain. No crepitus, laceration, effusion, swelling.  Pulses are normal   Neurological: He is alert and oriented to person, place, and time.  Skin: Skin is warm and dry.  Psychiatric: He has a normal mood and affect. Judgment normal.    ED Course  Procedures (including critical care time)  DIAGNOSTIC STUDIES: Oxygen Saturation is 99% on RA, normal by my interpretation.    COORDINATION OF CARE: 6:50 PM- Pt advised of plan for treatment and pt agrees.  Medications  diazepam (VALIUM) tablet 2 mg (not administered)  HYDROmorphone (DILAUDID) injection 1 mg (not administered)  predniSONE (DELTASONE) tablet 60 mg (not administered)  HYDROmorphone (DILAUDID) injection 2 mg (2 mg Intramuscular Given 07/03/12 1856)  ondansetron (ZOFRAN-ODT) disintegrating tablet 4 mg (4 mg Oral Given 07/03/12 1856)     Labs Reviewed - No data to display Dg Lumbar Spine Complete  07/03/2012   *RADIOLOGY REPORT*  Clinical Data: Low back pain.  LUMBAR SPINE - COMPLETE 4+ VIEW  Comparison: MRI of the lumbar spine 02/23/2012.  Findings: Five views of the lumbar spine demonstrate no acute displaced fractures or definite compression type fractures.  There is multilevel degenerative disc disease, most severe at L5-S1 where there is near complete loss of the intervertebral disc space. Multilevel facet arthropathy is also noted, most severe at L4-L5 and L5-S1.  3 mm of anterolisthesis of L4 upon L5.  Alignment is otherwise anatomic.  No defects of the pars interarticularis are noted.  Vascular stents are seen within the pelvis bilaterally.  IMPRESSION: 1.  No acute radiographic abnormality of the lumbar spine. 2.  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   Original Report Authenticated By: Trudie Reed, M.D.     1. Sciatica neuralgia, left       MDM  I discussed the case with  Dr. Rhunette Croft who recommends giving 2mg  IM shot of Dilaudid and a lumbar xray to rule out lytic lesion or pathologic fracture. Pt had recent MRI that should mild bulging disc.   Dr. Rhunette Croft saw patient as well and feels that he is safe to discharge and has really severe sciatica. He recommends Robaxin,prednisone and to add 4 days of Motrin.  72 y.o.Marc [name]'s evaluation in the Emergency Department is complete. It has been determined that no acute conditions requiring further emergency intervention are present at this time. The patient/guardian have been advised of the diagnosis and plan. We have discussed signs and symptoms that warrant return to the ED, such as changes or worsening in symptoms.  Vital signs are stable at discharge. Filed Vitals:   07/03/12 1824  BP: 160/90  Pulse: 76  Temp: 97.9 F (36.6 C)  Resp: 24    Patient/guardian has voiced understanding and agreed to follow-up with the PCP or specialist.   I personally performed the services described in this documentation, which was scribed in my presence. The recorded information has been reviewed and is accurate.    Dorthula Matas, PA-C 07/03/12 2033

## 2012-07-03 NOTE — Telephone Encounter (Signed)
OK to stop ASA and Plavix for procedure. Resume as soon as possible.

## 2012-07-08 NOTE — ED Provider Notes (Signed)
Medical screening examination/treatment/procedure(s) were conducted as a shared visit with non-physician practitioner(s) and myself.  I personally evaluated the patient during the encounter.  Pt comes in with back pain. Has some features of sciatica. Pt's pain is being managed by Ortho. Pt denies associated numbness, weakness, urinary incontinence, urinary retention, bowel incontinence, saddle anesthesia - and cord involvement not likely.  Plan is to attempt to get pain in control nd discharge home. Pt requested to return to the ER if he has any red flags for cord compression as outlined above, or if the pain is so sever that he is unable to function at home. Daughter lives at home, which is reassuring.    Derwood Kaplan, MD 07/08/12 1529

## 2012-11-19 ENCOUNTER — Other Ambulatory Visit: Payer: Self-pay | Admitting: Orthopedic Surgery

## 2012-11-23 ENCOUNTER — Telehealth: Payer: Self-pay | Admitting: *Deleted

## 2012-11-23 ENCOUNTER — Encounter: Payer: Self-pay | Admitting: *Deleted

## 2012-11-23 NOTE — Telephone Encounter (Signed)
D

## 2012-11-23 NOTE — Telephone Encounter (Signed)
Dr. Yevette Edwards nurse called stating that she wanted to know if Dr. Allyson Sabal received a Sx clearance for this pt.

## 2012-11-23 NOTE — Telephone Encounter (Signed)
Dr Allyson Sabal reviewed chart and gave clearance.  Letter made and faxed.

## 2012-11-23 NOTE — Telephone Encounter (Signed)
Returned call.  Left message clearance form received and Dr. Allyson Sabal to review w/ nurse Samara Deist.  Will fax back once complete.  Call back before 4pm if questions.

## 2012-11-26 ENCOUNTER — Encounter (HOSPITAL_COMMUNITY): Payer: Self-pay | Admitting: Pharmacy Technician

## 2012-11-27 ENCOUNTER — Other Ambulatory Visit (HOSPITAL_COMMUNITY): Payer: Self-pay | Admitting: *Deleted

## 2012-11-27 ENCOUNTER — Encounter (HOSPITAL_COMMUNITY): Payer: Self-pay

## 2012-11-27 ENCOUNTER — Encounter (HOSPITAL_COMMUNITY)
Admission: RE | Admit: 2012-11-27 | Discharge: 2012-11-27 | Disposition: A | Discharge status: Home / Self Care | Payer: Medicare Other | Source: Ambulatory Visit | Attending: Orthopedic Surgery | Admitting: Orthopedic Surgery

## 2012-11-27 DIAGNOSIS — Z01812 Encounter for preprocedural laboratory examination: Secondary | ICD-10-CM | POA: Insufficient documentation

## 2012-11-27 DIAGNOSIS — Z01818 Encounter for other preprocedural examination: Secondary | ICD-10-CM | POA: Insufficient documentation

## 2012-11-27 DIAGNOSIS — Z01810 Encounter for preprocedural cardiovascular examination: Secondary | ICD-10-CM | POA: Insufficient documentation

## 2012-11-27 HISTORY — DX: Gastro-esophageal reflux disease without esophagitis: K21.9

## 2012-11-27 LAB — CBC WITH DIFFERENTIAL/PLATELET
Basophils Absolute: 0.1 10*3/uL (ref 0.0–0.1)
Eosinophils Relative: 3 % (ref 0–5)
HCT: 38.1 % — ABNORMAL LOW (ref 39.0–52.0)
Hemoglobin: 13.1 g/dL (ref 13.0–17.0)
Lymphocytes Relative: 25 % (ref 12–46)
Lymphs Abs: 2.1 10*3/uL (ref 0.7–4.0)
MCV: 97.7 fL (ref 78.0–100.0)
Monocytes Absolute: 0.5 10*3/uL (ref 0.1–1.0)
Monocytes Relative: 6 % (ref 3–12)
Neutro Abs: 5.5 10*3/uL (ref 1.7–7.7)
Platelets: 195 10*3/uL (ref 150–400)
RDW: 13.3 % (ref 11.5–15.5)
WBC: 8.4 10*3/uL (ref 4.0–10.5)

## 2012-11-27 LAB — COMPREHENSIVE METABOLIC PANEL
ALT: <5 U/L (ref 0–53)
BUN: 28 mg/dL — ABNORMAL HIGH (ref 6–23)
CO2: 25 mEq/L (ref 19–32)
Calcium: 9.6 mg/dL (ref 8.4–10.5)
Chloride: 106 mEq/L (ref 96–112)
Creatinine, Ser: 1.88 mg/dL — ABNORMAL HIGH (ref 0.50–1.35)
GFR calc Af Amer: 39 mL/min — ABNORMAL LOW (ref >90)
GFR calc non Af Amer: 34 mL/min — ABNORMAL LOW (ref >90)
Glucose, Bld: 143 mg/dL — ABNORMAL HIGH (ref 70–99)

## 2012-11-27 LAB — URINALYSIS, ROUTINE W REFLEX MICROSCOPIC
Glucose, UA: 250 mg/dL — AB
Hgb urine dipstick: NEGATIVE
Ketones, ur: 15 mg/dL — AB
Protein, ur: 30 mg/dL — AB
pH: 5 (ref 5.0–8.0)

## 2012-11-27 LAB — PROTIME-INR
INR: 0.95 (ref 0.00–1.49)
Prothrombin Time: 12.5 seconds (ref 11.6–15.2)

## 2012-11-27 LAB — SURGICAL PCR SCREEN: MRSA, PCR: NEGATIVE

## 2012-11-27 LAB — TYPE AND SCREEN: ABO/RH(D): O NEG

## 2012-11-27 LAB — URINE MICROSCOPIC-ADD ON

## 2012-11-27 NOTE — Pre-Procedure Instructions (Addendum)
Marc [name]  11/27/2012   Your procedure is scheduled on:  12/02/12  Report to [facility]  2 * 3 at 530 AM.  Call this number if you have problems the morning of surgery: [phone]   Remember:   Do not eat food or drink liquids after midnight.   Take these medicines the morning of surgery with A SIP OF WATER: pain med if needed, gabapentin, prilosec           STOP aspirin   Do not wear jewelry, make-up or nail polish.  Do not wear lotions, powders, or perfumes. You may wear deodorant.  Do not shave 48 hours prior to surgery. Men may shave face and neck.  Do not bring valuables to the hospital.  [facility] is not responsible                  for any belongings or valuables.               Contacts, dentures or bridgework may not be worn into surgery.  Leave suitcase in the car. After surgery it may be brought to your room.  For patients admitted to the hospital, discharge time is determined by your                treatment team.               Patients discharged the day of surgery will not be allowed to drive  home.  Name and phone number of your driver:   Special Instructions: Incentive Spirometry - Practice and bring it with you on the day of surgery. Shower using CHG 2 nights before surgery and the night before surgery.  If you shower the day of surgery use CHG.  Use special wash - you have one bottle of CHG for all showers.  You should use approximately 1/3 of the bottle for each shower.   Please read over the following fact sheets that you were given: Pain Booklet, Coughing and Deep Breathing, Blood Transfusion Information, MRSA Information and Surgical Site Infection Prevention

## 2012-12-02 MED ORDER — CEFAZOLIN SODIUM-DEXTROSE 2-3 GM-% IV SOLR
2.0000 g | INTRAVENOUS | Status: AC
  Administered 2012-12-03: 2 g via INTRAVENOUS
  Filled 2012-12-02: qty 50

## 2012-12-03 ENCOUNTER — Encounter (HOSPITAL_COMMUNITY)
Admission: RE | Disposition: A | Discharge status: Skilled Nursing Facility | Payer: Self-pay | Source: Ambulatory Visit | Attending: Orthopedic Surgery

## 2012-12-03 ENCOUNTER — Inpatient Hospital Stay (HOSPITAL_COMMUNITY): Payer: Medicare Other

## 2012-12-03 ENCOUNTER — Encounter (HOSPITAL_COMMUNITY): Payer: Self-pay | Admitting: *Deleted

## 2012-12-03 ENCOUNTER — Inpatient Hospital Stay (HOSPITAL_COMMUNITY): Payer: Medicare Other | Admitting: Certified Registered"

## 2012-12-03 ENCOUNTER — Inpatient Hospital Stay (HOSPITAL_COMMUNITY)
Admission: RE | Admit: 2012-12-03 | Discharge: 2012-12-07 | DRG: 460 | Disposition: A | Discharge status: Skilled Nursing Facility | Payer: Medicare Other | Source: Ambulatory Visit | Attending: Orthopedic Surgery | Admitting: Orthopedic Surgery

## 2012-12-03 ENCOUNTER — Encounter (HOSPITAL_COMMUNITY): Payer: Medicare Other | Admitting: Certified Registered"

## 2012-12-03 DIAGNOSIS — K219 Gastro-esophageal reflux disease without esophagitis: Secondary | ICD-10-CM | POA: Diagnosis present

## 2012-12-03 DIAGNOSIS — F17200 Nicotine dependence, unspecified, uncomplicated: Secondary | ICD-10-CM | POA: Diagnosis present

## 2012-12-03 DIAGNOSIS — Z79899 Other long term (current) drug therapy: Secondary | ICD-10-CM

## 2012-12-03 DIAGNOSIS — Z7982 Long term (current) use of aspirin: Secondary | ICD-10-CM

## 2012-12-03 DIAGNOSIS — E119 Type 2 diabetes mellitus without complications: Secondary | ICD-10-CM | POA: Diagnosis present

## 2012-12-03 DIAGNOSIS — E7800 Pure hypercholesterolemia, unspecified: Secondary | ICD-10-CM | POA: Diagnosis present

## 2012-12-03 DIAGNOSIS — M109 Gout, unspecified: Secondary | ICD-10-CM | POA: Diagnosis present

## 2012-12-03 DIAGNOSIS — M129 Arthropathy, unspecified: Secondary | ICD-10-CM | POA: Diagnosis present

## 2012-12-03 DIAGNOSIS — M48061 Spinal stenosis, lumbar region without neurogenic claudication: Secondary | ICD-10-CM | POA: Diagnosis present

## 2012-12-03 DIAGNOSIS — K5900 Constipation, unspecified: Secondary | ICD-10-CM | POA: Diagnosis not present

## 2012-12-03 DIAGNOSIS — I10 Essential (primary) hypertension: Secondary | ICD-10-CM | POA: Diagnosis present

## 2012-12-03 DIAGNOSIS — Q762 Congenital spondylolisthesis: Principal | ICD-10-CM

## 2012-12-03 DIAGNOSIS — M5136 Other intervertebral disc degeneration, lumbar region: Secondary | ICD-10-CM

## 2012-12-03 DIAGNOSIS — I2510 Atherosclerotic heart disease of native coronary artery without angina pectoris: Secondary | ICD-10-CM | POA: Diagnosis present

## 2012-12-03 DIAGNOSIS — Z981 Arthrodesis status: Secondary | ICD-10-CM

## 2012-12-03 DIAGNOSIS — I739 Peripheral vascular disease, unspecified: Secondary | ICD-10-CM | POA: Diagnosis present

## 2012-12-03 LAB — GLUCOSE, CAPILLARY
Glucose-Capillary: 152 mg/dL — ABNORMAL HIGH (ref 70–99)
Glucose-Capillary: 79 mg/dL (ref 70–99)
Glucose-Capillary: 156 mg/dL — ABNORMAL HIGH (ref 70–99)

## 2012-12-03 SURGERY — POSTERIOR LUMBAR FUSION 1 LEVEL
Anesthesia: General | Site: Spine Lumbar | Wound class: Clean

## 2012-12-03 MED ORDER — ROCURONIUM BROMIDE 100 MG/10ML IV SOLN
INTRAVENOUS | Status: DC | PRN
  Administered 2012-12-03: 50 mg via INTRAVENOUS

## 2012-12-03 MED ORDER — THROMBIN 20000 UNITS EX SOLR
CUTANEOUS | Status: AC
  Filled 2012-12-03: qty 20000

## 2012-12-03 MED ORDER — CEFAZOLIN SODIUM 1-5 GM-% IV SOLN
1.0000 g | Freq: Three times a day (TID) | INTRAVENOUS | Status: AC
  Administered 2012-12-03 (×2): 1 g via INTRAVENOUS
  Filled 2012-12-03 (×2): qty 50

## 2012-12-03 MED ORDER — MORPHINE SULFATE 2 MG/ML IJ SOLN
1.0000 mg | INTRAMUSCULAR | Status: DC | PRN
  Administered 2012-12-06: 2 mg via INTRAVENOUS
  Filled 2012-12-03: qty 1

## 2012-12-03 MED ORDER — ONDANSETRON HCL 4 MG/2ML IJ SOLN
INTRAMUSCULAR | Status: AC
  Filled 2012-12-03: qty 2

## 2012-12-03 MED ORDER — MORPHINE SULFATE (PF) 1 MG/ML IV SOLN
INTRAVENOUS | Status: DC
Start: 2012-12-03 — End: 2012-12-04
  Administered 2012-12-03: 6 mg via INTRAVENOUS
  Administered 2012-12-03: 12:00:00 via INTRAVENOUS
  Administered 2012-12-03: 11 mg via INTRAVENOUS
  Administered 2012-12-04: 4 mg via INTRAVENOUS
  Administered 2012-12-04: 03:00:00 via INTRAVENOUS
  Administered 2012-12-04: 3.8 mg via INTRAVENOUS
  Administered 2012-12-04: 3.92 mg via INTRAVENOUS
  Filled 2012-12-03: qty 25

## 2012-12-03 MED ORDER — DIPHENHYDRAMINE HCL 12.5 MG/5ML PO ELIX
12.5000 mg | ORAL_SOLUTION | Freq: Four times a day (QID) | ORAL | Status: DC | PRN
  Filled 2012-12-03: qty 5

## 2012-12-03 MED ORDER — HYDROMORPHONE HCL PF 1 MG/ML IJ SOLN
INTRAMUSCULAR | Status: AC
  Filled 2012-12-03: qty 1

## 2012-12-03 MED ORDER — OMEPRAZOLE MAGNESIUM 20 MG PO TBEC: 40.0000 mg | DELAYED_RELEASE_TABLET | Freq: Every day | ORAL | Status: DC

## 2012-12-03 MED ORDER — SODIUM CHLORIDE 0.9 % IV SOLN
INTRAVENOUS | Status: DC
  Administered 2012-12-03: 1 mL via INTRAVENOUS
  Administered 2012-12-05: 12:00:00 via INTRAVENOUS

## 2012-12-03 MED ORDER — PHENYLEPHRINE HCL 10 MG/ML IJ SOLN
INTRAMUSCULAR | Status: DC | PRN
  Administered 2012-12-03 (×4): 80 ug via INTRAVENOUS

## 2012-12-03 MED ORDER — MORPHINE SULFATE (PF) 1 MG/ML IV SOLN
INTRAVENOUS | Status: AC
  Filled 2012-12-03: qty 25

## 2012-12-03 MED ORDER — SODIUM CHLORIDE 0.9 % IJ SOLN
3.0000 mL | Freq: Two times a day (BID) | INTRAMUSCULAR | Status: DC
  Administered 2012-12-05 – 2012-12-07 (×2): 3 mL via INTRAVENOUS

## 2012-12-03 MED ORDER — ONDANSETRON HCL 4 MG/2ML IJ SOLN
INTRAMUSCULAR | Status: DC | PRN
  Administered 2012-12-03: 4 mg via INTRAVENOUS

## 2012-12-03 MED ORDER — FLEET ENEMA 7-19 GM/118ML RE ENEM: 1.0000 | ENEMA | Freq: Once | RECTAL | Status: AC | PRN

## 2012-12-03 MED ORDER — BUPIVACAINE-EPINEPHRINE 0.25% -1:200000 IJ SOLN
INTRAMUSCULAR | Status: DC | PRN
  Administered 2012-12-03: 24 mL

## 2012-12-03 MED ORDER — SENNOSIDES-DOCUSATE SODIUM 8.6-50 MG PO TABS
1.0000 | ORAL_TABLET | Freq: Every evening | ORAL | Status: DC | PRN
  Administered 2012-12-07: 1 via ORAL
  Filled 2012-12-03: qty 1

## 2012-12-03 MED ORDER — PROPOFOL INFUSION 10 MG/ML OPTIME
INTRAVENOUS | Status: DC | PRN
  Administered 2012-12-03: 08:00:00 via INTRAVENOUS
  Administered 2012-12-03: 50 ug/kg/min via INTRAVENOUS

## 2012-12-03 MED ORDER — ONDANSETRON HCL 4 MG/2ML IJ SOLN: 4.0000 mg | INTRAMUSCULAR | Status: DC | PRN

## 2012-12-03 MED ORDER — BISACODYL 5 MG PO TBEC
5.0000 mg | DELAYED_RELEASE_TABLET | Freq: Every day | ORAL | Status: DC | PRN
  Administered 2012-12-07: 5 mg via ORAL
  Filled 2012-12-03: qty 1

## 2012-12-03 MED ORDER — MENTHOL 3 MG MT LOZG: 1.0000 | LOZENGE | OROMUCOSAL | Status: DC | PRN

## 2012-12-03 MED ORDER — MIDAZOLAM HCL 5 MG/5ML IJ SOLN
INTRAMUSCULAR | Status: DC | PRN
  Administered 2012-12-03: 1 mg via INTRAVENOUS

## 2012-12-03 MED ORDER — DOCUSATE SODIUM 100 MG PO CAPS
100.0000 mg | ORAL_CAPSULE | Freq: Two times a day (BID) | ORAL | Status: DC
  Administered 2012-12-03 – 2012-12-07 (×8): 100 mg via ORAL
  Filled 2012-12-03 (×9): qty 1

## 2012-12-03 MED ORDER — PROMETHAZINE HCL 25 MG/ML IJ SOLN: 6.2500 mg | INTRAMUSCULAR | Status: DC | PRN

## 2012-12-03 MED ORDER — ALLOPURINOL 300 MG PO TABS
300.0000 mg | ORAL_TABLET | Freq: Every day | ORAL | Status: DC
  Administered 2012-12-03 – 2012-12-07 (×5): 300 mg via ORAL
  Filled 2012-12-03 (×5): qty 1

## 2012-12-03 MED ORDER — LISINOPRIL 20 MG PO TABS
20.0000 mg | ORAL_TABLET | Freq: Every day | ORAL | Status: DC
  Administered 2012-12-03 – 2012-12-07 (×5): 20 mg via ORAL
  Filled 2012-12-03 (×5): qty 1

## 2012-12-03 MED ORDER — SODIUM CHLORIDE 0.9 % IV SOLN
10.0000 mg | INTRAVENOUS | Status: DC | PRN
  Administered 2012-12-03: 40 ug/min via INTRAVENOUS

## 2012-12-03 MED ORDER — LIDOCAINE HCL (CARDIAC) 20 MG/ML IV SOLN
INTRAVENOUS | Status: DC | PRN
  Administered 2012-12-03: 60 mg via INTRAVENOUS

## 2012-12-03 MED ORDER — BUPIVACAINE-EPINEPHRINE PF 0.25-1:200000 % IJ SOLN
INTRAMUSCULAR | Status: AC
  Filled 2012-12-03: qty 30

## 2012-12-03 MED ORDER — LACTATED RINGERS IV SOLN
INTRAVENOUS | Status: DC | PRN
  Administered 2012-12-03 (×2): via INTRAVENOUS

## 2012-12-03 MED ORDER — PANTOPRAZOLE SODIUM 40 MG PO TBEC
80.0000 mg | DELAYED_RELEASE_TABLET | Freq: Every day | ORAL | Status: DC
  Administered 2012-12-04 – 2012-12-07 (×4): 80 mg via ORAL
  Filled 2012-12-03 (×4): qty 2

## 2012-12-03 MED ORDER — GABAPENTIN 600 MG PO TABS
1200.0000 mg | ORAL_TABLET | Freq: Three times a day (TID) | ORAL | Status: DC
  Administered 2012-12-03 – 2012-12-07 (×12): 1200 mg via ORAL
  Filled 2012-12-03 (×14): qty 2

## 2012-12-03 MED ORDER — METHYLENE BLUE 1 % INJ SOLN
INTRAMUSCULAR | Status: AC
  Filled 2012-12-03: qty 10

## 2012-12-03 MED ORDER — INDIGOTINDISULFONATE SODIUM 8 MG/ML IJ SOLN
INTRAMUSCULAR | Status: DC | PRN
  Administered 2012-12-03: .5 mL

## 2012-12-03 MED ORDER — SODIUM CHLORIDE 0.9 % IJ SOLN: 9.0000 mL | INTRAMUSCULAR | Status: DC | PRN

## 2012-12-03 MED ORDER — POVIDONE-IODINE 7.5 % EX SOLN: Freq: Once | CUTANEOUS | Status: DC

## 2012-12-03 MED ORDER — HYDROMORPHONE HCL PF 1 MG/ML IJ SOLN
0.2500 mg | INTRAMUSCULAR | Status: DC | PRN
  Administered 2012-12-03 (×2): 0.5 mg via INTRAVENOUS

## 2012-12-03 MED ORDER — GLIMEPIRIDE 4 MG PO TABS
4.0000 mg | ORAL_TABLET | Freq: Two times a day (BID) | ORAL | Status: DC
  Administered 2012-12-03 – 2012-12-07 (×8): 4 mg via ORAL
  Filled 2012-12-03 (×10): qty 1

## 2012-12-03 MED ORDER — ONDANSETRON HCL 4 MG/2ML IJ SOLN
4.0000 mg | Freq: Four times a day (QID) | INTRAMUSCULAR | Status: DC | PRN
  Filled 2012-12-03: qty 2

## 2012-12-03 MED ORDER — DIPHENHYDRAMINE HCL 50 MG/ML IJ SOLN
12.5000 mg | Freq: Four times a day (QID) | INTRAMUSCULAR | Status: DC | PRN
  Filled 2012-12-03: qty 0.25

## 2012-12-03 MED ORDER — ACETAMINOPHEN 650 MG RE SUPP: 650.0000 mg | RECTAL | Status: DC | PRN

## 2012-12-03 MED ORDER — SIMVASTATIN 20 MG PO TABS
20.0000 mg | ORAL_TABLET | Freq: Every day | ORAL | Status: DC
  Administered 2012-12-03 – 2012-12-07 (×5): 20 mg via ORAL
  Filled 2012-12-03 (×5): qty 1

## 2012-12-03 MED ORDER — PROPOFOL 10 MG/ML IV BOLUS
INTRAVENOUS | Status: DC | PRN
  Administered 2012-12-03: 120 mg via INTRAVENOUS

## 2012-12-03 MED ORDER — SODIUM CHLORIDE 0.9 % IV SOLN: 250.0000 mL | INTRAVENOUS | Status: DC

## 2012-12-03 MED ORDER — DIAZEPAM 5 MG PO TABS
5.0000 mg | ORAL_TABLET | Freq: Four times a day (QID) | ORAL | Status: DC | PRN
  Administered 2012-12-03 – 2012-12-07 (×6): 5 mg via ORAL
  Filled 2012-12-03 (×5): qty 1

## 2012-12-03 MED ORDER — ALUM & MAG HYDROXIDE-SIMETH 200-200-20 MG/5ML PO SUSP: 30.0000 mL | Freq: Four times a day (QID) | ORAL | Status: DC | PRN

## 2012-12-03 MED ORDER — THROMBIN 20000 UNITS EX SOLR
CUTANEOUS | Status: DC | PRN
  Administered 2012-12-03: 09:00:00

## 2012-12-03 MED ORDER — ZOLPIDEM TARTRATE 5 MG PO TABS: 5.0000 mg | ORAL_TABLET | Freq: Every evening | ORAL | Status: DC | PRN

## 2012-12-03 MED ORDER — NALOXONE HCL 0.4 MG/ML IJ SOLN
0.4000 mg | INTRAMUSCULAR | Status: DC | PRN
  Filled 2012-12-03: qty 1

## 2012-12-03 MED ORDER — ACETAMINOPHEN 325 MG PO TABS: 650.0000 mg | ORAL_TABLET | ORAL | Status: DC | PRN

## 2012-12-03 MED ORDER — OXYCODONE-ACETAMINOPHEN 5-325 MG PO TABS
1.0000 | ORAL_TABLET | ORAL | Status: DC | PRN
  Administered 2012-12-03: 2 via ORAL

## 2012-12-03 MED ORDER — PHENOL 1.4 % MT LIQD: 1.0000 | OROMUCOSAL | Status: DC | PRN

## 2012-12-03 MED ORDER — SUFENTANIL CITRATE 50 MCG/ML IV SOLN
INTRAVENOUS | Status: DC | PRN
  Administered 2012-12-03: 15 ug via INTRAVENOUS
  Administered 2012-12-03 (×2): 5 ug via INTRAVENOUS
  Administered 2012-12-03: 10 ug via INTRAVENOUS

## 2012-12-03 MED ORDER — SODIUM CHLORIDE 0.9 % IJ SOLN: 3.0000 mL | INTRAMUSCULAR | Status: DC | PRN

## 2012-12-03 MED ORDER — DIAZEPAM 5 MG PO TABS
ORAL_TABLET | ORAL | Status: AC
  Filled 2012-12-03: qty 1

## 2012-12-03 MED ORDER — OXYCODONE-ACETAMINOPHEN 5-325 MG PO TABS
ORAL_TABLET | ORAL | Status: AC
  Filled 2012-12-03: qty 2

## 2012-12-03 SURGICAL SUPPLY — 79 items
APL SKNCLS STERI-STRIP NONHPOA (GAUZE/BANDAGES/DRESSINGS) ×1
BENZOIN TINCTURE PRP APPL 2/3 (GAUZE/BANDAGES/DRESSINGS) ×2 IMPLANT
BLADE SURG ROTATE 9660 (MISCELLANEOUS) IMPLANT
BUR ROUND PRECISION 4.0 (BURR) ×2 IMPLANT
CAGE BULLET CONCORDE 9X9X27 (Cage) ×1 IMPLANT
CARTRIDGE OIL MAESTRO DRILL (MISCELLANEOUS) ×2 IMPLANT
CLOTH BEACON ORANGE TIMEOUT ST (SAFETY) ×2 IMPLANT
CLSR STERI-STRIP ANTIMIC 1/2X4 (GAUZE/BANDAGES/DRESSINGS) ×1 IMPLANT
CONT SPEC STER OR (MISCELLANEOUS) ×2 IMPLANT
CORDS BIPOLAR (ELECTRODE) ×2 IMPLANT
COVER SURGICAL LIGHT HANDLE (MISCELLANEOUS) ×2 IMPLANT
DIFFUSER DRILL AIR PNEUMATIC (MISCELLANEOUS) ×3 IMPLANT
DRAIN CHANNEL 15F RND FF W/TCR (WOUND CARE) IMPLANT
DRAPE C-ARM 42X72 X-RAY (DRAPES) ×2 IMPLANT
DRAPE ORTHO SPLIT 77X108 STRL (DRAPES) ×2
DRAPE POUCH INSTRU U-SHP 10X18 (DRAPES) ×2 IMPLANT
DRAPE SURG 17X23 STRL (DRAPES) ×6 IMPLANT
DRAPE SURG ORHT 6 SPLT 77X108 (DRAPES) ×1 IMPLANT
DURAPREP 26ML APPLICATOR (WOUND CARE) ×2 IMPLANT
ELECT BLADE 4.0 EZ CLEAN MEGAD (MISCELLANEOUS) ×2
ELECT CAUTERY BLADE 6.4 (BLADE) ×2 IMPLANT
ELECT REM PT RETURN 9FT ADLT (ELECTROSURGICAL) ×2
ELECTRODE BLDE 4.0 EZ CLN MEGD (MISCELLANEOUS) ×1 IMPLANT
ELECTRODE REM PT RTRN 9FT ADLT (ELECTROSURGICAL) ×1 IMPLANT
EVACUATOR SILICONE 100CC (DRAIN) IMPLANT
GAUZE SPONGE 4X4 16PLY XRAY LF (GAUZE/BANDAGES/DRESSINGS) ×5 IMPLANT
GLOVE BIO SURGEON STRL SZ7 (GLOVE) ×3 IMPLANT
GLOVE BIO SURGEON STRL SZ8 (GLOVE) ×2 IMPLANT
GLOVE BIOGEL PI IND STRL 7.5 (GLOVE) ×1 IMPLANT
GLOVE BIOGEL PI IND STRL 8 (GLOVE) ×1 IMPLANT
GLOVE BIOGEL PI INDICATOR 7.5 (GLOVE) ×1
GLOVE BIOGEL PI INDICATOR 8 (GLOVE) ×1
GOWN STRL NON-REIN LRG LVL3 (GOWN DISPOSABLE) ×5 IMPLANT
GOWN STRL REIN XL XLG (GOWN DISPOSABLE) ×2 IMPLANT
IV CATH 14GX2 1/4 (CATHETERS) ×2 IMPLANT
KIT BASIN OR (CUSTOM PROCEDURE TRAY) ×2 IMPLANT
KIT POSITION SURG JACKSON T1 (MISCELLANEOUS) ×2 IMPLANT
KIT ROOM TURNOVER OR (KITS) ×2 IMPLANT
MARKER SKIN DUAL TIP RULER LAB (MISCELLANEOUS) ×2 IMPLANT
MIX DBX 10CC 35% BONE (Bone Implant) ×1 IMPLANT
NDL 18GX1X1/2 (RX/OR ONLY) (NEEDLE) IMPLANT
NDL HYPO 25GX1X1/2 BEV (NEEDLE) ×1 IMPLANT
NDL SPNL 18GX3.5 QUINCKE PK (NEEDLE) ×2 IMPLANT
NEEDLE 18GX1X1/2 (RX/OR ONLY) (NEEDLE) ×2 IMPLANT
NEEDLE BONE MARROW 8GX6 FENEST (NEEDLE) IMPLANT
NEEDLE HYPO 25GX1X1/2 BEV (NEEDLE) ×2 IMPLANT
NEEDLE SPNL 18GX3.5 QUINCKE PK (NEEDLE) ×4 IMPLANT
NS IRRIG 1000ML POUR BTL (IV SOLUTION) ×2 IMPLANT
OIL CARTRIDGE MAESTRO DRILL (MISCELLANEOUS) ×2
PACK LAMINECTOMY ORTHO (CUSTOM PROCEDURE TRAY) ×2 IMPLANT
PACK UNIVERSAL I (CUSTOM PROCEDURE TRAY) ×2 IMPLANT
PAD ARMBOARD 7.5X6 YLW CONV (MISCELLANEOUS) ×4 IMPLANT
PATTIES SURGICAL .5 X1 (DISPOSABLE) ×2 IMPLANT
PATTIES SURGICAL .5X1.5 (GAUZE/BANDAGES/DRESSINGS) ×1 IMPLANT
ROD PRE BENT EXP 40MM (Rod) ×1 IMPLANT
ROD PRE BENT EXPEDIUM 35MM (Rod) ×1 IMPLANT
SCREW POLYAXIAL 7X45MM (Screw) ×4 IMPLANT
SCREW SET SINGLE INNER (Screw) ×4 IMPLANT
SPONGE GAUZE 4X4 12PLY (GAUZE/BANDAGES/DRESSINGS) ×2 IMPLANT
SPONGE INTESTINAL PEANUT (DISPOSABLE) ×2 IMPLANT
SPONGE SURGIFOAM ABS GEL 100 (HEMOSTASIS) ×2 IMPLANT
STRIP CLOSURE SKIN 1/2X4 (GAUZE/BANDAGES/DRESSINGS) ×2 IMPLANT
SURGIFLO TRUKIT (HEMOSTASIS) IMPLANT
SUT MNCRL AB 4-0 PS2 18 (SUTURE) ×3 IMPLANT
SUT VIC AB 0 CT1 18XCR BRD 8 (SUTURE) ×1 IMPLANT
SUT VIC AB 0 CT1 8-18 (SUTURE) ×2
SUT VIC AB 1 CT1 18XCR BRD 8 (SUTURE) ×2 IMPLANT
SUT VIC AB 1 CT1 8-18 (SUTURE) ×2
SUT VIC AB 2-0 CT2 18 VCP726D (SUTURE) ×2 IMPLANT
SYR 20CC LL (SYRINGE) ×2 IMPLANT
SYR BULB IRRIGATION 50ML (SYRINGE) ×2 IMPLANT
SYR CONTROL 10ML LL (SYRINGE) ×2 IMPLANT
SYR TB 1ML LUER SLIP (SYRINGE) ×2 IMPLANT
TAPE CLOTH SURG 4X10 WHT LF (GAUZE/BANDAGES/DRESSINGS) ×1 IMPLANT
TOWEL OR 17X24 6PK STRL BLUE (TOWEL DISPOSABLE) ×2 IMPLANT
TOWEL OR 17X26 10 PK STRL BLUE (TOWEL DISPOSABLE) ×2 IMPLANT
TRAY FOLEY CATH 16FRSI W/METER (SET/KITS/TRAYS/PACK) ×2 IMPLANT
WATER STERILE IRR 1000ML POUR (IV SOLUTION) ×1 IMPLANT
YANKAUER SUCT BULB TIP NO VENT (SUCTIONS) ×3 IMPLANT

## 2012-12-03 NOTE — H&P (Signed)
PREOPERATIVE H&P  Chief Complaint: left leg pain and weakness  HPI: Marc [name] is a 72 y.o. male who presents with ongoing left leg pain and weakness. MRI = L4 nerve compression. Patient failed conservative care. Xrays = slippage at L4/5.  Past Medical History  Diagnosis Date  . Hypertension   . Ulcer disease   . Colon polyp   . Gout   . Bradycardia   . Hypercholesteremia   . PAD (peripheral artery disease)   . Chronic bronchitis     "used to be very susceptible when I was a kid & smoking" (02/04/2012)  . Type II diabetes mellitus   . History of blood transfusion ~ 2007    "10 of them when I had stomach bleeding"  . Bleeding stomach ulcer 2007  . Arthritis     "qwhere that you can get it; mostly in my shoulders" (02/04/2012)  . Gout   . S/P angioplasty with stent, to distal RT ext. iliac artery 03/10/2012  . Tobacco abuse disorder 03/10/2012  . GERD (gastroesophageal reflux disease)   . Muscle spasms of neck     has spasms over body when get up from sitting   Past Surgical History  Procedure Laterality Date  . Splenectomy  ~ 1957  . Anterior cervical decomp/discectomy fusion  ~ 2007  . Upper endoscopy w/ sclerotherapy  ~ 2007  . Polypectomy    . Colonoscopy    . Iliac artery stent      left common/notes (02/04/2012)  . Hernia repair  ~ 2007    UHR (02/04/2012)  . Tonsillectomy  ~ 1947  . Sfa    . Sfa  03/10/2012    ILLEAC STENOSIS   History   Social History  . Marital Status: Married    Spouse Name: N/A    Number of Children: 2  . Years of Education: N/A   Occupational History  . retired   . cattle farm     owner   Social History Main Topics  . Smoking status: Current Every Day Smoker -- 2.00 packs/day for 58 years    Types: Cigarettes  . Smokeless tobacco: Former Neurosurgeon    Types: Chew  . 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 Drug Use: No  .  Sexual Activity: No   Other Topics Concern  . None   Social History Narrative  . None   Family History  Problem Relation Age of Onset  . Colon cancer Paternal Uncle     Uncle  . Heart disease Father   . Heart disease Mother    No Known Allergies Prior to Admission medications   Medication Sig Start Date End Date Taking? Authorizing Provider  allopurinol (ZYLOPRIM) 300 MG tablet Take 300 mg by mouth daily.   Yes Historical Provider, MD  aspirin EC 81 MG tablet Take 81 mg by mouth daily.   Yes Historical Provider, MD  gabapentin (NEURONTIN) 600 MG tablet Take 1,200 mg by mouth every 8 (eight) hours.    Yes Historical Provider, MD  glimepiride (AMARYL) 4 MG tablet Take 4 mg by mouth 2 (two) times daily.    Yes Historical Provider, MD  HYDROcodone-acetaminophen (NORCO) 10-325 MG per tablet Take 2 tablets by mouth every 8 (eight) hours.  05/08/10  Yes Historical Provider, MD  indomethacin (INDOCIN)  25 MG capsule Take 50 mg by mouth 2 (two) times daily with a meal.   Yes Historical Provider, MD  lisinopril (PRINIVIL,ZESTRIL) 20 MG tablet Take 20 mg by mouth daily.     Yes Historical Provider, MD  lovastatin (MEVACOR) 40 MG tablet Take 40 mg by mouth at bedtime.   Yes Historical Provider, MD  omeprazole (PRILOSEC OTC) 20 MG tablet Take 40 mg by mouth daily.    Yes Historical Provider, MD     All other systems have been reviewed and were otherwise negative with the exception of those mentioned in the HPI and as above.  Physical Exam: Filed Vitals:   12/03/12 0557  BP: 134/76  Pulse: 88  Temp: 97.2 F (36.2 C)  Resp: 18    General: Alert, no acute distress Cardiovascular: No pedal edema Respiratory: No cyanosis, no use of accessory musculature Skin: No lesions in the area of chief complaint Neurologic: Sensation intact distally Psychiatric: Patient is competent for consent with normal mood and affect Lymphatic: No axillary or cervical lymphadenopathy  MUSCULOSKELETAL: 0 reflex  left knee, weakness to IP on left  Assessment/Plan: Left leg pain and weakness Plan for Procedure(s): Left sided lumbar 4-5 Transforaminal lumbar interbody fusion with instrumentation and allograft.   Emilee Hero, MD 12/03/2012 7:07 AM

## 2012-12-03 NOTE — Progress Notes (Signed)
Utilization review completed.  

## 2012-12-03 NOTE — Transfer of Care (Signed)
Immediate Anesthesia Transfer of Care Note  Patient: Marc [name]  Procedure(s) Performed: Procedure(s) with comments: Left sided lumbar 4-5 Transforaminal lumbar interbody fusion with instrumentation and allograft. (N/A) - Left sided lumbar 4-5 Transforaminal lumbar interbody fusion with instrumentation and allograft.  Patient Location: PACU  Anesthesia Type:General  Level of Consciousness: awake  Airway & Oxygen Therapy: Patient Spontanous Breathing and Patient connected to nasal cannula oxygen  Post-op Assessment: Report given to PACU RN, Post -op Vital signs reviewed and stable and Patient moving all extremities  Post vital signs: Reviewed and stable  Complications: No apparent anesthesia complications and Patient re-intubated

## 2012-12-03 NOTE — Anesthesia Postprocedure Evaluation (Signed)
  Anesthesia Post-op Note  Patient: Marc [name]  Procedure(s) Performed: Procedure(s) (LRB): Left sided lumbar 4-5 Transforaminal lumbar interbody fusion with instrumentation and allograft. (N/A)  Patient Location: PACU  Anesthesia Type: General  Level of Consciousness: awake and alert   Airway and Oxygen Therapy: Patient Spontanous Breathing  Post-op Pain: mild  Post-op Assessment: Post-op Vital signs reviewed, Patient's Cardiovascular Status Stable, Respiratory Function Stable, Patent Airway and No signs of Nausea or vomiting  Last Vitals:  Filed Vitals:   12/03/12 1537  BP:   Pulse:   Temp:   Resp: 11    Post-op Vital Signs: stable   Complications: No apparent anesthesia complications

## 2012-12-03 NOTE — Anesthesia Preprocedure Evaluation (Addendum)
Anesthesia Evaluation  Patient identified by MRN, date of birth, ID band Patient awake    Reviewed: Allergy & Precautions, H&P , NPO status , Patient's Chart, lab work & pertinent test results  Airway Mallampati: II TM Distance: >3 FB Neck ROM: Full    Dental no notable dental hx. (+) Teeth Intact and Dental Advisory Given   Pulmonary Current Smoker,  CXR: Suspected mild COPD (mild hyperinflation) breath sounds clear to auscultation  Pulmonary exam normal       Cardiovascular hypertension, Pt. on medications + Peripheral Vascular Disease negative cardio ROS  Rhythm:Regular Rate:Normal  ECG: Sinus arrhythmia. LAD   Neuro/Psych  Neuromuscular disease negative psych ROS   GI/Hepatic negative GI ROS, Neg liver ROS, PUD, GERD-  Medicated,  Endo/Other  diabetes, Type 2, Oral Hypoglycemic Agents  Renal/GU Cr 1.88 K 5.1  negative genitourinary   Musculoskeletal negative musculoskeletal ROS (+)   Abdominal   Peds negative pediatric ROS (+)  Hematology negative hematology ROS (+)   Anesthesia Other Findings   Reproductive/Obstetrics negative OB ROS                         Anesthesia Physical Anesthesia Plan  ASA: III  Anesthesia Plan: General   Post-op Pain Management:    Induction: Intravenous  Airway Management Planned: Oral ETT  Additional Equipment:   Intra-op Plan:   Post-operative Plan: Extubation in OR  Informed Consent: I have reviewed the patients History and Physical, chart, labs and discussed the procedure including the risks, benefits and alternatives for the proposed anesthesia with the patient or authorized representative who has indicated his/her understanding and acceptance.   Dental advisory given  Plan Discussed with: CRNA, Anesthesiologist and Surgeon  Anesthesia Plan Comments:        Anesthesia Quick Evaluation

## 2012-12-03 NOTE — Preoperative (Signed)
Beta Blockers   Reason not to administer Beta Blockers:Not Applicable 

## 2012-12-03 NOTE — Anesthesia Procedure Notes (Signed)
Procedure Name: Intubation Date/Time: 12/03/2012 7:36 AM Performed by: Charm Barges, Rayshad Riviello R Pre-anesthesia Checklist: Patient identified, Emergency Drugs available, Suction available, Patient being monitored and Timeout performed Patient Re-evaluated:Patient Re-evaluated prior to inductionOxygen Delivery Method: Circle system utilized Preoxygenation: Pre-oxygenation with 100% oxygen Intubation Type: IV induction Ventilation: Mask ventilation without difficulty Laryngoscope Size: Mac and 4 Grade View: Grade I Tube type: Oral Tube size: 7.5 mm Number of attempts: 1 Airway Equipment and Method: Stylet Placement Confirmation: ETT inserted through vocal cords under direct vision,  positive ETCO2 and breath sounds checked- equal and bilateral Secured at: 23 cm Tube secured with: Tape Dental Injury: Teeth and Oropharynx as per pre-operative assessment

## 2012-12-04 LAB — GLUCOSE, CAPILLARY
Glucose-Capillary: 53 mg/dL — ABNORMAL LOW (ref 70–99)
Glucose-Capillary: 56 mg/dL — ABNORMAL LOW (ref 70–99)
Glucose-Capillary: 66 mg/dL — ABNORMAL LOW (ref 70–99)
Glucose-Capillary: 96 mg/dL (ref 70–99)
Glucose-Capillary: 101 mg/dL — ABNORMAL HIGH (ref 70–99)

## 2012-12-04 MED ORDER — ENSURE COMPLETE PO LIQD
237.0000 mL | Freq: Two times a day (BID) | ORAL | Status: DC
  Administered 2012-12-04 – 2012-12-07 (×6): 237 mL via ORAL

## 2012-12-04 MED ORDER — OXYCODONE HCL ER 20 MG PO T12A
20.0000 mg | EXTENDED_RELEASE_TABLET | Freq: Two times a day (BID) | ORAL | Status: DC
  Administered 2012-12-04: 20 mg via ORAL
  Filled 2012-12-04: qty 2

## 2012-12-04 MED ORDER — OXYCODONE-ACETAMINOPHEN 5-325 MG PO TABS
2.0000 | ORAL_TABLET | ORAL | Status: DC | PRN
  Administered 2012-12-04: 1 via ORAL
  Administered 2012-12-05: 2 via ORAL
  Administered 2012-12-05 (×4): 1 via ORAL
  Administered 2012-12-06 – 2012-12-07 (×5): 2 via ORAL
  Filled 2012-12-04 (×11): qty 2

## 2012-12-04 NOTE — Progress Notes (Signed)
Hypoglycemic Event  CBG: 56  Treatment: 15 GM carbohydrate snack x2  Symptoms: None   Follow-up CBG: Time: 1716 CBG Result:66  Possible Reasons for Event: Inadequate meal intake  Comments/MD notified: Dumonksi notified. Dannielle Burn PA responded/made aware. No new orders at this time. (1732)    Marc [name] E  Remember to initiate Hypoglycemia Order Set & complete

## 2012-12-04 NOTE — Evaluation (Signed)
Physical Therapy Evaluation Patient Details Name: Marc [name] MRN: [id number] DOB: 10-19-1940 Today's Date: 12/04/2012 Time: 4098-1191 PT Time Calculation (min): 37 min  PT Assessment / Plan / Recommendation History of Present Illness  Pt is a 72 y/o male admitted s/p L4-L5 TLIF with pedicle screw fixation on 12/03/2012.   Clinical Impression  This patient presents with acute pain and decreased functional independence following the above mentioned procedure. At the time of PT eval, pt required +3 assist for squat pivot transfer to the recliner, with lift pad placed in chair for staff transfer back to bed. Pain appeared to be limiting this pt the most, however general weakness of bilateral LE's made transfers especially difficult. Pt's brother present during session and states he will not be able to provide the physical assistance needed for mobility if pt returns home. This patient is appropriate for skilled PT interventions to address functional limitations, improve safety and independence with functional mobility, and return to PLOF.     PT Assessment  Patient needs continued PT services    Follow Up Recommendations  SNF    Does the patient have the potential to tolerate intense rehabilitation      Barriers to Discharge Decreased caregiver support Pt's brother reports that he will be able to stay with pt if he returns home but he will not be able to provide the level of assistance that the pt requires for mobility.    Equipment Recommendations  Rolling walker with 5" wheels;3in1 (PT)    Recommendations for Other Services     Frequency Min 5X/week    Precautions / Restrictions Precautions Precautions: Fall;Back Precaution Booklet Issued: Yes (comment) Precaution Comments: Reviewed back precautions with pt and again with brother who was present in room Restrictions Weight Bearing Restrictions: No   Pertinent Vitals/Pain Pt unable to rate pain on 0-10 scale. During transfers  he was yelling in pain, however refused to activate his pain pump when offered to him.      Mobility  Bed Mobility Bed Mobility: Rolling Right;Right Sidelying to Sit;Sitting - Scoot to [place] of Bed Rolling Right: 1: +2 Total assist;With rail Rolling Right: Patient Percentage: 10% Right Sidelying to Sit: 1: +2 Total assist Right Sidelying to Sit: Patient Percentage: 10% Sitting - Scoot to Edge of Bed: 1: +2 Total assist Sitting - Scoot to Edge of Bed: Patient Percentage: 20% Details for Bed Mobility Assistance: Bed pad used for assist to roll pt to side. Heavy assist to move LE's to EOB as well as to elevate trunk to full sitting.  Transfers Transfers: Sit to Stand;Stand to Sit;Squat Pivot Transfers Sit to Stand: 1: +2 Total assist;From bed;With upper extremity assist Sit to Stand: Patient Percentage: 0% Stand to Sit: 1: +2 Total assist;To bed Stand to Sit: Patient Percentage: 0% Squat Pivot Transfers: 1: +2 Total assist;Without upper extremity assistance (+3 total assist) Squat Pivot Transfers: Patient Percentage: 10% Details for Transfer Assistance: Pt unable to come to full stand with RW and +2 assist. Squat pivot transfer to recliner, with lift pad placed under pt for staff to transfer pt back to bed in the future. Ambulation/Gait Ambulation/Gait Assistance: Not tested (comment) Ambulation/Gait Assistance Details: Unable at this time    Exercises     PT Diagnosis: Difficulty walking;Acute pain  PT Problem List: Decreased strength;Decreased range of motion;Decreased activity tolerance;Decreased balance;Decreased mobility;Decreased knowledge of use of DME;Decreased safety awareness;Decreased knowledge of precautions;Pain PT Treatment Interventions: DME instruction;Stair training;Gait training;Functional mobility training;Therapeutic activities;Therapeutic exercise;Neuromuscular re-education;Patient/family education  PT Goals(Current goals can be found in the care plan  section) Acute Rehab PT Goals Patient Stated Goal: Unable to state a goal at this time PT Goal Formulation: With patient/family Time For Goal Achievement: 12/11/12 Potential to Achieve Goals: Fair  Visit Information  Last PT Received On: 12/04/12 Assistance Needed: +3 or more History of Present Illness: Pt is a 72 y/o male admitted s/p L4-L5 TLIF with pedicle screw fixation on 12/03/2012.        Prior Functioning  Home Living Family/patient expects to be discharged to:: Private residence Living Arrangements: Alone Available Help at Discharge: Family;Available 24 hours/day (Brother could make himself available ) Type of Home: Mobile home Home Access: Stairs to enter [employer] of Steps: 7 Entrance Stairs-Rails: Left Home Layout: One level Home Equipment: None Prior Function Level of Independence: Independent Communication Communication: No difficulties Dominant Hand: Right    Cognition  Cognition Arousal/Alertness: Awake/alert Behavior During Therapy: WFL for tasks assessed/performed Overall Cognitive Status: Within Functional Limits for tasks assessed    Extremity/Trunk Assessment Upper Extremity Assessment Upper Extremity Assessment: Defer to OT evaluation Lower Extremity Assessment Lower Extremity Assessment: Generalized weakness Cervical / Trunk Assessment Cervical / Trunk Assessment: Kyphotic   Balance Balance Balance Assessed: Yes Static Sitting Balance Static Sitting - Balance Support: Feet supported;Bilateral upper extremity supported Static Sitting - Level of Assistance: 3: Mod assist;4: Min assist Static Sitting - Comment/# of Minutes: 8 minutes at EOB Dynamic Sitting Balance Dynamic Sitting - Balance Support: Feet supported;Bilateral upper extremity supported Dynamic Sitting - Level of Assistance: 3: Mod assist  End of Session PT - End of Session Equipment Utilized During Treatment: Gait belt Activity Tolerance: Patient limited by  pain;Patient limited by lethargy Patient left: in chair;with call bell/phone within reach;with nursing/sitter in room Nurse Communication: Mobility status;Need for lift equipment  GP     Ruthann Cancer 12/04/2012, 12:46 PM  Ruthann Cancer, PT, DPT [phone]

## 2012-12-04 NOTE — Progress Notes (Signed)
Patient reports decrease in left leg pain. + moderate LBP  BP 152/77  Pulse 104  Temp(Src) 99.5 F (37.5 C) (Oral)  Resp 18  Ht 6\' 1"  (1.854 m)  SpO2 95%  NVI Dressing CDI  POD #1 after L4/5 decompression and fusion  - up with PT today - patient does need to mobilize today - d/c PCA, start oxycontin, percocet, and valium - likely d/c home Sunday, possible with oxycontin

## 2012-12-04 NOTE — Evaluation (Signed)
Occupational Therapy Evaluation Patient Details Name: Marc [name] MRN: [id number] DOB: [birth year] Today's Date: 12/04/2012 Time: 4098-1191 OT Time Calculation (min): 27 min  OT Assessment / Plan / Recommendation History of present illness Pt is a 72 y/o male admitted s/p L4-L5 TLIF with pedicle screw fixation on 12/03/2012.    Clinical Impression   This 72 yo male s/p above presents to acute OT with deficits below, increased pain, decreased knowledge of back precautions, and decreased caregiver support at home all affecting pt's PLOF of Independent at home. Will benefit from SNF prior to D/C home so he can get back to a level that he can take care of himself.    OT Assessment  Patient needs continued OT Services    Follow Up Recommendations  SNF    Barriers to Discharge Decreased caregiver support    Equipment Recommendations   (TBD at next venue)       Frequency  Min 2X/week    Precautions / Restrictions Precautions Precautions: Fall;Back Precaution Booklet Issued: Yes (comment) Precaution Comments: Reviewed back precautions with pt and again with brother who was present in room Restrictions Weight Bearing Restrictions: No   Pertinent Vitals/Pain 6/10 FACES; back; repositioned    ADL  Transfers/Ambulation Related to ADLs: total A +3 (stand pivot to pt's left from bed raised to recliner) ADL Comments: Pt is currently total A for all BADLs due to lethargy and pain    OT Diagnosis: Generalized weakness;Acute pain  OT Problem List: Decreased strength;Decreased range of motion;Decreased activity tolerance;Impaired balance (sitting and/or standing);Pain;Decreased knowledge of precautions;Decreased knowledge of use of DME or AE OT Treatment Interventions: Self-care/ADL training;Balance training;DME and/or AE instruction;Patient/family education   OT Goals(Current goals can be found in the care plan section) Acute Rehab OT Goals Patient Stated Goal: Unable to state a goal  at this time OT Goal Formulation: Patient unable to participate in goal setting Time For Goal Achievement: 12/18/12 Potential to Achieve Goals: Good  Visit Information  Last OT Received On: 12/04/12 Assistance Needed: +3 or more History of Present Illness: Pt is a 72 y/o male admitted s/p L4-L5 TLIF with pedicle screw fixation on 12/03/2012.        Prior Functioning     Home Living Family/patient expects to be discharged to:: Private residence Living Arrangements: Alone Available Help at Discharge: Family;Available 24 hours/day (Brother could make himself available ) Type of Home: Mobile home Home Access: Stairs to enter [employer] of Steps: 7 Entrance Stairs-Rails: Left Home Layout: One level Home Equipment: None Prior Function Level of Independence: Independent Communication Communication: No difficulties Dominant Hand: Right         Vision/Perception Vision - History Patient Visual Report: No change from baseline   Cognition  Cognition Arousal/Alertness: Awake/alert Behavior During Therapy: WFL for tasks assessed/performed Overall Cognitive Status: Within Functional Limits for tasks assessed    Extremity/Trunk Assessment Upper Extremity Assessment Upper Extremity Assessment: Overall WFL for tasks assessed Lower Extremity Assessment Lower Extremity Assessment: Generalized weakness Cervical / Trunk Assessment Cervical / Trunk Assessment: Kyphotic     Mobility Bed Mobility Bed Mobility: Rolling Right;Right Sidelying to Sit;Sitting - Scoot to [place] of Bed Rolling Right: 1: +2 Total assist;With rail Rolling Right: Patient Percentage: 10% Right Sidelying to Sit: 1: +2 Total assist Right Sidelying to Sit: Patient Percentage: 10% Sitting - Scoot to Edge of Bed: 1: +2 Total assist Sitting - Scoot to Edge of Bed: Patient Percentage: 20% Details for Bed Mobility Assistance: Bed pad used for assist  to roll pt to side. Heavy assist to move LE's to EOB as  well as to elevate trunk to full sitting.  Transfers Sit to Stand: 1: +2 Total assist;From bed;With upper extremity assist Sit to Stand: Patient Percentage: 0% Stand to Sit: 1: +2 Total assist;To bed Stand to Sit: Patient Percentage: 0% Details for Transfer Assistance: Pt unable to come to full stand with RW and +2 assist. Squat pivot transfer to recliner, with lift pad placed under pt for staff to transfer pt back to bed in the future.        Balance Balance Balance Assessed: Yes Static Sitting Balance Static Sitting - Balance Support: Feet supported;Bilateral upper extremity supported Static Sitting - Level of Assistance: 3: Mod assist;4: Min assist Static Sitting - Comment/# of Minutes: 8 minutes at EOB Dynamic Sitting Balance Dynamic Sitting - Balance Support: Feet supported;Bilateral upper extremity supported Dynamic Sitting - Level of Assistance: 3: Mod assist   End of Session OT - End of Session Equipment Utilized During Treatment: Gait belt;Rolling walker Activity Tolerance: Patient limited by lethargy;Patient limited by pain Patient left: in chair;with call bell/phone within reach;with family/visitor present Nurse Communication: Need for lift equipment       Marc [name] [phone] 12/04/2012, 2:54 PM

## 2012-12-04 NOTE — Progress Notes (Signed)
INITIAL NUTRITION ASSESSMENT  DOCUMENTATION CODES Per approved criteria  -Not Applicable   INTERVENTION:  1. Ensure Complete po BID, each supplement provides 350 kcal and 13 grams of protein.  2. If pt goes home, encouraged brother to make sure pt has ensure/boost, etc at home and to also provide pt with other easy foods, yogurt, pudding, cheese sticks, etc to encourage more intake.   NUTRITION DIAGNOSIS: Inadequate oral intake related to decreased appetite/pain as evidenced by meal completion <25%.   Goal: Pt to meet >/= 90% of their estimated nutrition needs   Monitor:  PO intake, supplement acceptance, weight trend, labs  Reason for Assessment: Pt identified as at nutrition risk on the Malnutrition Screen Tool  72 y.o. male  Admitting Dx: <principal problem not specified>  ASSESSMENT: Pt admitted with back pain. Pt is s/p L4-L5. Pt sleeping in chair. Brother at bedside and reports that pt has been sleeping all day due to pain medications which are being changed. Per brother pt is now down to 185 lb, pt is in the chair so unable to confirm. Per brother pt has been losing weight for the last 6 months due to a divorce with his wife. Pt now lives alone. Pt eating cereal for Breakfast, and a sandwich for lunch and dinner. Brother reports that pt sits in his recliner all day and doesn't do much anymore.   Nutrition Focused Physical Exam:  Subcutaneous Fat:  Orbital Region: WNL Upper Arm Region: WNL Thoracic and Lumbar Region: WNL  Muscle:  Temple Region: WNL Clavicle Bone Region: WNL Clavicle and Acromion Bone Region: WNL Scapular Bone Region: WNL Dorsal Hand: mild wasting Patellar Region: WNL Anterior Thigh Region: WNL Posterior Calf Region: WNL  Edema: not present   Height: Ht Readings from Last 1 Encounters:  12/03/12 6\' 1"  (1.854 m)    Weight: Wt Readings from Last 1 Encounters:  11/27/12 210 lb 2 oz (95.312 kg)    Ideal Body Weight: 83.6 kg   % Ideal  Body Weight: 114%  Wt Readings from Last 10 Encounters:  11/27/12 210 lb 2 oz (95.312 kg)  03/11/12 216 lb 14.9 oz (98.4 kg)  03/11/12 216 lb 14.9 oz (98.4 kg)  02/05/12 212 lb 15.4 oz (96.6 kg)  02/05/12 212 lb 15.4 oz (96.6 kg)  01/24/12 216 lb (97.977 kg)  07/09/10 220 lb (99.791 kg)  06/06/10 218 lb (98.884 kg)  06/05/10 218 lb (98.884 kg)  06/01/10 221 lb 8 oz (100.472 kg)    Usual Body Weight: 212-216 lb  % Usual Body Weight: 99%  BMI:  27.7 - overweight  Estimated Nutritional Needs: Kcal: 2000-2200 Protein: 100-110 grams Fluid: > 2 L/day  Skin: back incision   Diet Order: Carb Control  EDUCATION NEEDS: -No education needs identified at this time   Intake/Output Summary (Last 24 hours) at 12/04/12 1434 Last data filed at 12/04/12 0800  Gross per 24 hour  Intake    120 ml  Output   1900 ml  Net  -1780 ml    Last BM: 11/6   Labs:  No results found for this basename: NA, K, CL, CO2, BUN, CREATININE, CALCIUM, MG, PHOS, GLUCOSE,  in the last 168 hours  CBG (last 3)   Recent Labs  12/03/12 1705 12/03/12 2144 12/04/12 1206  GLUCAP 156* 123* 89    Scheduled Meds: . allopurinol  300 mg Oral Daily  . docusate sodium  100 mg Oral BID  . gabapentin  1,200 mg Oral Q8H  .  glimepiride  4 mg Oral BID WC  . lisinopril  20 mg Oral Daily  . pantoprazole  80 mg Oral Daily  . simvastatin  20 mg Oral q1800  . sodium chloride  3 mL Intravenous Q12H    Continuous Infusions: . sodium chloride    . sodium chloride 1 mL (12/03/12 1409)    Past Medical History  Diagnosis Date  . Hypertension   . Ulcer disease   . Colon polyp   . Gout   . Bradycardia   . Hypercholesteremia   . PAD (peripheral artery disease)   . Chronic bronchitis     "used to be very susceptible when I was a kid & smoking" (02/04/2012)  . Type II diabetes mellitus   . History of blood transfusion ~ 2007    "10 of them when I had stomach bleeding"  . Bleeding stomach ulcer 2007  .  Arthritis     "qwhere that you can get it; mostly in my shoulders" (02/04/2012)  . Gout   . S/P angioplasty with stent, to distal RT ext. iliac artery 03/10/2012  . Tobacco abuse disorder 03/10/2012  . GERD (gastroesophageal reflux disease)   . Muscle spasms of neck     has spasms over body when get up from sitting    Past Surgical History  Procedure Laterality Date  . Splenectomy  ~ 1957  . Anterior cervical decomp/discectomy fusion  ~ 2007  . Upper endoscopy w/ sclerotherapy  ~ 2007  . Polypectomy    . Colonoscopy    . Iliac artery stent      left common/notes (02/04/2012)  . Hernia repair  ~ 2007    UHR (02/04/2012)  . Tonsillectomy  ~ 1947  . Sfa    . Sfa  03/10/2012    [facility]    Kendell Bane RD, LDN, CNSC [phone] Pager [phone] After Hours Pager

## 2012-12-04 NOTE — Op Note (Signed)
NAME:  Marc [name], Marc [name] NO.:  [id number]  MEDICAL RECORD NO.:  [id number]  LOCATION:  [id number]                        FACILITY:  [facility]  PHYSICIAN:  Estill Bamberg, MD      DATE OF BIRTH:  [birth year]  DATE OF PROCEDURE:  12/03/2012                              OPERATIVE REPORT   PREOPERATIVE DIAGNOSES: 1. 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 2. 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 3. Left-sided L4 radiculopathy,  manifesting as numbness, weakness,     and pain.  POSTOPERATIVE DIAGNOSES: 1. 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 2. 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 3. Left-sided L4 radiculopathy,  manifesting as numbness, weakness,     and pain.  PROCEDURES: 1. Left-sided L4-5 transforaminal lumbar interbody fusion. 2. Right-sided L4-5 posterolateral fusion. 3. Placement of posterior instrumentation, L4, L5 (7 x 45 mm screws). 4. Insertion of interbody device x1 (9 x 27 mm [employer] bullet cage). 5. Use of morselized allograft. 6. Use of local autograft. 7. Intraoperative use of fluoroscopy.  SURGEON:  Estill Bamberg, MD  ASSISTANTS:  Jason Coop, PA-C  ANESTHESIA:  General endotracheal anesthesia.  COMPLICATIONS:  None.  DISPOSITION:  Stable  ESTIMATED BLOOD LOSS:  200 mL.  INDICATIONS FOR PROCEDURE:  Briefly, Mr. Marc [name] is a very pleasant 72-year-old male, who did present to me with significant weakness and pain in the left leg, in the distribution of the L4 nerve.  An MRI did reveal neuroforaminal stenosis on the left, with clear compression of the exiting L4 nerve.  His exam was very much consistent with left-sided L4 radiculopathy.  I did proceed with getting the patient arranged for a left-sided L4 selective nerve block.  He was very clear and stating to me that the nerve block did entirely did substantially alleviate his left leg pain, but only in a temporary basis.  Given the instability and findings reflected above, we did discuss proceeding  with a left-sided L4- 5 transforaminal lumbar interbody fusion, as reflected above.  The patient did fully understand the risks and limitations of the procedure as outlined in my preoperative note.  OPERATIVE DETAILS:  On December 03, 2012, the patient brought to surgery and general endotracheal anesthesia was administered.  The patient was placed prone on a well-padded flat Jackson bed with a spinal frame. Antibiotics were given.  All bony prominences were meticulously padded. The back was prepped and draped in the usual sterile fashion.  I then made a midline incision overlying the L4-5 interspace.  The fascia was incised at the midline.  The paraspinal musculature was bluntly swept laterally on both the right and the left sides.  I then subperiosteally exposed the L4-5 facet joint and the transverse processes of L4 and of L5.  On the right side, I did decorticated the posterior elements and the L4-5 facet joint using a high-speed bur.  I then used a high-speed bur to cannulate the L4 and L5 pedicles on the right.  This was followed by a gearshift probe.  I did use a ball-tip probe to confirm that there was no cortical violation of the cannulated pedicles.  I then used a 6 mm tap at L4 and L5 and again, a ball-tipped probe did confirm no cortical  violation.  The pedicles were then cannulated on the left side in a similar fashion.  On the right, I did place approximately 5 mL of the allograft in the form of DBX mix in the posterolateral gutter to help aid in fusion.  I then placed 7 x 45 mm screws on the right.  A 40 mm rod was secured into the heads of the screws.  Distraction was applied and caps were placed and the caps were provisionally tightened. On the left side, I did place bone wax and the cannulated pedicles.  I then went forward with a thorough and complete facetectomy on the left. The exiting L4 nerve was readily noted, and all compression of the nerve was removed via a full  facetectomy on the left side.  I then had an assistant medially mobilized the traversing L5 nerve.  I then used a 15- blade knife to perform an annulotomy at the posterolateral aspect of the disk on the left side.  I then used a series of paddle scrapers as well as curettes and pituitary rongeurs to perform a thorough and complete diskectomy.  The endplates were appropriately prepared.  I then placed a series of trials, using intraoperative fluoroscopy, I was able to determine that a 9 x 27 mm interbody spacer would be the most appropriate fit.  The interbody space was then packed with autograft obtained from removing the facet joint, in addition to the allograft in the form of the DBX mix.  The interbody spacer was then packed with the same, and was tamped into position in the usual fashion.  I was very pleased with the final press fit of the implant.  I was very pleased with the AP and lateral fluoroscopic images.  At this point, I removed the distraction on the right side.  I then placed 7 x 45 mm screws at L4 and at L5 on the left.  A 35-mm rod was secured on the heads of the screws on the left.  I then placed caps on the left and a final locking procedure was performed on the left, and then on the right.  I was very pleased to the AP and lateral fluoroscopic images.  I then explored the wound for any undue bleeding, and there are minimal epidural veins noted to be bleeding, which was controlled using bipolar electrocautery.  At this point, the wound was copiously irrigated with approximately 1 L of normal saline.  I then closed the fascia using #1 Vicryl.  The subcutaneous layer was closed using 2-0 Vicryl.  The skin was closed using 3-0 Monocryl.  Benzoin and Steri-Strips were applied followed by sterile dressing.  All instrument counts were correct at the termination of the procedure.  Of note, Jason Coop was my assistant throughout the entirety of the procedure, and did aid  in essential retraction and suctioning needed throughout the entirety of the surgery.     Estill Bamberg, MD     MD/MEDQ  D:  12/03/2012  T:  12/04/2012  Job:  [id number]

## 2012-12-05 LAB — GLUCOSE, CAPILLARY
Glucose-Capillary: 179 mg/dL — ABNORMAL HIGH (ref 70–99)
Glucose-Capillary: 203 mg/dL — ABNORMAL HIGH (ref 70–99)
Glucose-Capillary: 206 mg/dL — ABNORMAL HIGH (ref 70–99)
Glucose-Capillary: 232 mg/dL — ABNORMAL HIGH (ref 70–99)
Glucose-Capillary: 49 mg/dL — ABNORMAL LOW (ref 70–99)
Glucose-Capillary: 75 mg/dL (ref 70–99)

## 2012-12-05 NOTE — Progress Notes (Signed)
Physical Therapy Treatment Patient Details Name: Marc [name] MRN: [id number] DOB: [birth year] Today's Date: 12/05/2012 Time: 7829-5621 PT Time Calculation (min): 35 min  PT Assessment / Plan / Recommendation  History of Present Illness     PT Comments   Pt more alert and awake today.  Pt's brother present in room during Rx.  He reports this is the first day the pt has been awake in 3 days.  Pt very agitated requiring max encouragement to participate in PT.  After agreeing to work with PT, he demo'd good progress with mobility as compared to initial eval.  Pt's desire is to return home with HHPT.  PT is still currently recommending SNF.  Pt reports he will refuse to go to SNF.  Follow Up Recommendations  SNF     Does the patient have the potential to tolerate intense rehabilitation     Barriers to Discharge        Equipment Recommendations  Rolling walker with 5" wheels;3in1 (PT)    Recommendations for Other Services    Frequency Min 5X/week   Progress towards PT Goals Progress towards PT goals: Progressing toward goals  Plan Current plan remains appropriate    Precautions / Restrictions Precautions Precautions: Back;Fall Precaution Comments: Reviewed 3/3 back precautions   Pertinent Vitals/Pain 10/10    Mobility  Bed Mobility Right Sidelying to Sit: 1: +2 Total assist Right Sidelying to Sit: Patient Percentage: 30% Transfers Sit to Stand: 1: +2 Total assist;From bed;With upper extremity assist Sit to Stand: Patient Percentage: 40% Stand to Sit: 3: Mod assist;To chair/3-in-1;With armrests;With upper extremity assist Details for Transfer Assistance: verbal cues for hand placement Ambulation/Gait Ambulation/Gait Assistance: 3: Mod assist Ambulation Distance (Feet): 4 Feet Assistive device: Rolling walker Gait Pattern: Step-to pattern;Decreased stride length Gait velocity: decreased General Gait Details: verbal cues for sequencing and RW management    Exercises      PT Diagnosis:    PT Problem List:   PT Treatment Interventions:     PT Goals (current goals can now be found in the care plan section)    Visit Information  Last PT Received On: 12/05/12 Assistance Needed: +2    Subjective Data      Cognition  Cognition Arousal/Alertness: Awake/alert Behavior During Therapy: Agitated Overall Cognitive Status: Within Functional Limits for tasks assessed    Balance     End of Session PT - End of Session Equipment Utilized During Treatment: Gait belt Activity Tolerance: Patient limited by pain;Treatment limited secondary to agitation Patient left: in chair;with call bell/phone within reach;with family/visitor present Nurse Communication: Mobility status   GP     Ilda Foil 12/05/2012, 2:52 PM  Aida Raider, PT  Office # [phone] Pager [phone]

## 2012-12-05 NOTE — Progress Notes (Signed)
PATIENT ID: Marc [name]  MRN: [id number]  DOB/AGE:  02/18/1940 / 72 y.o.  2 Days Post-Op Procedure(s) (LRB): Left sided lumbar 4-5 Transforaminal lumbar interbody fusion with instrumentation and allograft. (N/A)    PROGRESS NOTE Subjective:   Patient is alert, oriented, no Nausea, no Vomiting, yes passing gas, no Bowel Movement. Taking PO well. Denies SOB, Chest or Calf Pain. Using Incentive Spirometer, PAS in place. Ambulate WBAT, Patient reports pain as severe    Objective: Vital signs in last 24 hours: Temp:  [98 F (36.7 C)-99.2 F (37.3 C)] 98 F (36.7 C) (11/08 0437) Pulse Rate:  [74-102] 74 (11/08 0437) Resp:  [16-18] 16 (11/08 0437) BP: (126-147)/(57-76) 147/57 mmHg (11/08 0437) SpO2:  [97 %-99 %] 97 % (11/08 0437)    Intake/Output from previous day: I/O last 3 completed shifts: In: 240 [P.O.:240] Out: 2200 [Urine:2200]   Intake/Output this shift: Total I/O In: -  Out: 400 [Urine:400]   LABORATORY DATA:  Recent Labs  12/05/12 0020 12/05/12 0101 12/05/12 0405  GLUCAP 49* 75 99    Examination: Neurologically intact Neurovascular intact Sensation intact distally Intact pulses distally Dorsiflexion/Plantar flexion intact Incision: no drainage No cellulitis present}  Assessment:   2 Days Post-Op Procedure(s) (LRB): Left sided lumbar 4-5 Transforaminal lumbar interbody fusion with instrumentation and allograft. (N/A) ADDITIONAL DIAGNOSIS:  Hyperglycemia, Hypertension and CAD with stent placement  Plan:  Weight Bearing as Tolerated (WBAT)  DVT Prophylaxis:  Foot Pumps  DISCHARGE PLAN: Home  Up with therapy today and pt encouraged to do the best he can.  He was apprehensive to walking with assistance. Plan to D/C tomorrow depending on how he does with therapy. Pt currently using percocet for pain control.     Rachel Rison R 12/05/2012, 8:48 AM

## 2012-12-05 NOTE — Progress Notes (Signed)
Physical Therapy Treatment Patient Details Name: Marc [name] MRN: [id number] DOB: 19-Feb-1940 Today's Date: 12/05/2012 Time: 1535-1550 PT Time Calculation (min): 15 min  PT Assessment / Plan / Recommendation  History of Present Illness     PT Comments   Pt moving much better.  Pt able to complete sit to stand to sit with 1-person assist.  Follow Up Recommendations  SNF     Does the patient have the potential to tolerate intense rehabilitation     Barriers to Discharge        Equipment Recommendations  Rolling walker with 5" wheels;3in1 (PT)    Recommendations for Other Services    Frequency Min 5X/week   Progress towards PT Goals Progress towards PT goals: Progressing toward goals  Plan Current plan remains appropriate    Precautions / Restrictions Precautions Precautions: Fall;Back Precaution Comments: Reviewed 3/3 back precautions   Pertinent Vitals/Pain 8/10    Mobility  Bed Mobility Bed Mobility: Sit to Sidelying Right Right Sidelying to Sit: 1: +2 Total assist Right Sidelying to Sit: Patient Percentage: 30% Sit to Sidelying Right: 1: +2 Total assist Sit to Sidelying Right: Patient Percentage: 20% Transfers Transfers: Stand Pivot Transfers Sit to Stand: 2: Max assist;With armrests;From chair/3-in-1 Sit to Stand: Patient Percentage: 40% Stand to Sit: 3: Mod assist;To bed;With upper extremity assist Stand Pivot Transfers: 3: Mod assist Details for Transfer Assistance: SPT with RW, verbal cues for sequencing.  Multi attempts needed for sit to stand from recliner with 1-person assist. Ambulation/Gait Ambulation/Gait Assistance: 3: Mod assist Ambulation Distance (Feet): 4 Feet Assistive device: Rolling walker Gait Pattern: Step-to pattern;Decreased stride length Gait velocity: decreased General Gait Details: verbal cues for sequencing and RW management    Exercises     PT Diagnosis:    PT Problem List:   PT Treatment Interventions:     PT Goals  (current goals can now be found in the care plan section)    Visit Information  Last PT Received On: 12/05/12 Assistance Needed: +2    Subjective Data      Cognition  Cognition Arousal/Alertness: Awake/alert Behavior During Therapy: WFL for tasks assessed/performed Overall Cognitive Status: Within Functional Limits for tasks assessed    Balance     End of Session PT - End of Session Equipment Utilized During Treatment: Gait belt Activity Tolerance: Patient limited by pain Patient left: in bed;with call bell/phone within reach;with family/visitor present Nurse Communication: Mobility status   GP     Ilda Foil 12/05/2012, 3:51 PM  Aida Raider, PT  Office # [phone] Pager [phone]

## 2012-12-06 LAB — GLUCOSE, CAPILLARY
Glucose-Capillary: 101 mg/dL — ABNORMAL HIGH (ref 70–99)
Glucose-Capillary: 210 mg/dL — ABNORMAL HIGH (ref 70–99)

## 2012-12-06 NOTE — Discharge Summary (Signed)
Patient ID: Marc [name] MRN: [id number] DOB/AGE: [birth year] 72 y.o.  Admit date: 12/03/2012 Discharge date: 12/06/2012  Admission Diagnoses:  Active Problems:   * No active hospital problems. *   Discharge Diagnoses:  Same  Past Medical History  Diagnosis Date  . Hypertension   . Ulcer disease   . Colon polyp   . Gout   . Bradycardia   . Hypercholesteremia   . PAD (peripheral artery disease)   . Chronic bronchitis     "used to be very susceptible when I was a kid & smoking" (02/04/2012)  . Type II diabetes mellitus   . History of blood transfusion ~ 2007    "10 of them when I had stomach bleeding"  . Bleeding stomach ulcer 2007  . Arthritis     "qwhere that you can get it; mostly in my shoulders" (02/04/2012)  . Gout   . S/P angioplasty with stent, to distal RT ext. iliac artery 03/10/2012  . Tobacco abuse disorder 03/10/2012  . GERD (gastroesophageal reflux disease)   . Muscle spasms of neck     has spasms over body when get up from sitting    Surgeries: Procedure(s): Left sided lumbar 4-5 Transforaminal lumbar interbody fusion with instrumentation and allograft. on 12/03/2012   Consultants:    Discharged Condition: Improved  Hospital Course: Marc [name] is an 72 y.o. male who was admitted 12/03/2012 for operative treatment of<principal problem not specified>. Patient has severe unremitting pain that affects sleep, daily activities, and work/hobbies. After pre-op clearance the patient was taken to the operating room on 12/03/2012 and underwent  Procedure(s): Left sided lumbar 4-5 Transforaminal lumbar interbody fusion with instrumentation and allograft..    Patient was given perioperative antibiotics: Anti-infectives   Start     Dose/Rate Route Frequency Ordered Stop   12/03/12 1600  ceFAZolin (ANCEF) IVPB 1 g/50 mL premix     1 g 100 mL/hr over 30 Minutes Intravenous Every 8 hours 12/03/12 1342 12/03/12 2334   12/03/12 0600  ceFAZolin (ANCEF) IVPB 2 g/50 mL  premix     2 g 100 mL/hr over 30 Minutes Intravenous On call to O.R. 12/02/12 1434 12/03/12 0729       Patient was given sequential compression devices, early ambulation, and chemoprophylaxis to prevent DVT.  Patient benefited maximally from hospital stay and there were no complications.    Recent vital signs: Patient Vitals for the past 24 hrs:  BP Temp Pulse Resp SpO2 Height Weight  12/06/12 0558 140/62 mmHg 98.1 F (36.7 C) 78 17 98 % - -  12/05/12 2300 132/76 mmHg 97.6 F (36.4 C) 68 18 98 % - -  12/05/12 2100 - - - - - 6\' 2"  (1.88 m) 95.255 kg (210 lb)  12/05/12 1356 143/47 mmHg 98 F (36.7 C) 90 18 98 % - -     Recent laboratory studies: No results found for this basename: WBC, HGB, HCT, PLT, NA, K, CL, CO2, BUN, CREATININE, GLUCOSE, PT, INR, CALCIUM, 2,  in the last 72 hours   Discharge Medications:     Medication List    STOP taking these medications       HYDROcodone-acetaminophen 10-325 MG per tablet  Commonly known as:  NORCO     indomethacin 25 MG capsule  Commonly known as:  INDOCIN      TAKE these medications       allopurinol 300 MG tablet  Commonly known as:  ZYLOPRIM  Take 300 mg by mouth daily.  aspirin EC 81 MG tablet  Take 81 mg by mouth daily.     gabapentin 600 MG tablet  Commonly known as:  NEURONTIN  Take 1,200 mg by mouth every 8 (eight) hours.     glimepiride 4 MG tablet  Commonly known as:  AMARYL  Take 4 mg by mouth 2 (two) times daily.     lisinopril 20 MG tablet  Commonly known as:  PRINIVIL,ZESTRIL  Take 20 mg by mouth daily.     lovastatin 40 MG tablet  Commonly known as:  MEVACOR  Take 40 mg by mouth at bedtime.     omeprazole 20 MG tablet  Commonly known as:  PRILOSEC OTC  Take 40 mg by mouth daily.        Diagnostic Studies: Dg Chest 2 View  11/27/2012   CLINICAL DATA:  Preoperative respiratory examination for lumbar fusion.  EXAM: CHEST  2 VIEW  COMPARISON:  Portable chest 10/16/2005.  FINDINGS: The heart  size and mediastinal contours are stable. The lungs appear mildly hyperinflated but clear. There is no pleural effusion. The osseous structures appear unchanged.  IMPRESSION: Stable chest . Suspected mild chronic obstructive pulmonary disease .   Electronically Signed   By: Roxy Horseman M.D.   On: 11/27/2012 14:52   Dg Lumbar Spine 2-3 Views  12/03/2012   CLINICAL DATA:  Lumbar fusion surgery  EXAM: LUMBAR SPINE - 2-3 VIEW; DG C-ARM 1-60 MIN  COMPARISON:  MR 10/17/2012 and earlier studies  FINDINGS: Two intraoperative fluoroscopic spot images document bilateral pedicle screw placement at L4 and L5 with vertical interconnecting hardware. Graft markers project in the L4-5 interspace.  IMPRESSION: PLIF L4-5   Electronically Signed   By: Oley Balm M.D.   On: 12/03/2012 15:13   Dg Lumbar Spine 2-3 Views  12/03/2012   CLINICAL DATA:  L4-5 discectomy and fusion  EXAM: LUMBAR SPINE - 2-3 VIEW  COMPARISON:  MRI 10/17/2012  FINDINGS: Initial film shows needles at the spinous processes of L3 and L4. Second film shows tissue spreaders posteriorly with a probe directed towards L4-5 disc level.  IMPRESSION: L4-5 localized   Electronically Signed   By: Paulina Fusi M.D.   On: 12/03/2012 09:10   Dg C-arm 1-60 Min  12/03/2012   CLINICAL DATA:  Lumbar fusion surgery  EXAM: LUMBAR SPINE - 2-3 VIEW; DG C-ARM 1-60 MIN  COMPARISON:  MR 10/17/2012 and earlier studies  FINDINGS: Two intraoperative fluoroscopic spot images document bilateral pedicle screw placement at L4 and L5 with vertical interconnecting hardware. Graft markers project in the L4-5 interspace.  IMPRESSION: PLIF L4-5   Electronically Signed   By: Oley Balm M.D.   On: 12/03/2012 15:13    Disposition: 01-Home or Self Care      Discharge Orders   Future Orders Complete By Expires   Call MD / Call 911  As directed    Comments:     If you experience chest pain or shortness of breath, CALL 911 and be transported to the hospital emergency room.  If  you develope a fever above 101 F, pus (white drainage) or increased drainage or redness at the wound, or calf pain, call your surgeon's office.   Constipation Prevention  As directed    Comments:     Drink plenty of fluids.  Prune juice may be helpful.  You may use a stool softener, such as Colace (over the counter) 100 mg twice a day.  Use MiraLax (over the counter) for constipation  as needed.   Diet - low sodium heart healthy  As directed    Discharge instructions  As directed    Comments:     Follow up in office with Dr. Yevette Edwards as previously scheduled.   Driving restrictions  As directed    Comments:     No driving for 2 weeks   Increase activity slowly as tolerated  As directed       Follow-up Information   Follow up with Emilee Hero, MD In 2 weeks.   Specialty:  Orthopedic Surgery   Contact information:   [address] [place] [address] [phone]        Signed: Vear Clock, ERIC R 12/06/2012, 8:17 AM

## 2012-12-06 NOTE — Progress Notes (Signed)
PATIENT ID: Marc [name]  MRN: [id number]  DOB/AGE:  January 30, 1940 / 72 y.o.  3 Days Post-Op Procedure(s) (LRB): Left sided lumbar 4-5 Transforaminal lumbar interbody fusion with instrumentation and allograft. (N/A)    PROGRESS NOTE Subjective:   Patient is alert, oriented, no Nausea, no Vomiting, yes passing gas, no Bowel Movement. Taking PO well. Denies SOB, Chest or Calf Pain. Using Incentive Spirometer, PAS in place. Ambulate WBAT, Patient reports pain as moderate,    Objective: Vital signs in last 24 hours: Temp:  [97.6 F (36.4 C)-98.1 F (36.7 C)] 98.1 F (36.7 C) (11/09 0558) Pulse Rate:  [68-90] 78 (11/09 0558) Resp:  [17-18] 17 (11/09 0558) BP: (132-143)/(47-76) 140/62 mmHg (11/09 0558) SpO2:  [98 %] 98 % (11/09 0558) Weight:  [95.255 kg (210 lb)] 95.255 kg (210 lb) (11/08 2100)    Intake/Output from previous day: I/O last 3 completed shifts: In: 1080 [P.O.:1080] Out: 951 [Urine:951]   Intake/Output this shift:     LABORATORY DATA:  Recent Labs  12/05/12 1621 12/05/12 2307 12/06/12 0630  GLUCAP 232* 203* 101*    Examination: Neurologically intact Neurovascular intact Sensation intact distally Intact pulses distally Dorsiflexion/Plantar flexion intact Incision: dressing C/D/I No cellulitis present}  Assessment:   3 Days Post-Op Procedure(s) (LRB): Left sided lumbar 4-5 Transforaminal lumbar interbody fusion with instrumentation and allograft. (N/A) ADDITIONAL DIAGNOSIS:  Hyperglycemia, Hypertension and CAD with stent placement  Plan:  Weight Bearing as Tolerated (WBAT)  DVT Prophylaxis:  Foot Pumps  DISCHARGE PLAN: Home  Up with therapy today. Plan to D/C today depending on how he does with therapy.  D/C medications in chart include valium and percocet. Follow up in office with Dr. Yevette Edwards as previously scheduled.     Bevelyn Arriola R 12/06/2012, 8:10 AM

## 2012-12-06 NOTE — Progress Notes (Signed)
Clinical Social Work Department BRIEF PSYCHOSOCIAL ASSESSMENT 12/06/2012  Patient:  Marc [name], Marc [name]     Account Number:  [id number]     Admit date:  12/03/2012  Clinical Social Worker:  Hendricks Milo  Date/Time:  12/06/2012 06:27 PM  Referred by:  Physician  Date Referred:  12/06/2012 Referred for  SNF Placement   Other Referral:   Interview type:  Patient Other interview type:    PSYCHOSOCIAL DATA Living Status:  ALONE Admitted from facility:   Level of care:   Primary support name:  Marc [name] Primary support relationship to patient:  SIBLING Degree of support available:   Very supportive at bedside.    CURRENT CONCERNS  Other Concerns:    SOCIAL WORK ASSESSMENT / PLAN Clinical Social Worker (CSW) met with patient and brother to discuss SNF placement. Patient was agreeable to SNF search in [place]. Patient was reluctant to go to SNF so CSW explained the importance of SNF and getting his strength back.   Assessment/plan status:  Psychosocial Support/Ongoing Assessment of Needs Other assessment/ plan:   Information/referral to community resources:   CSW gave patient SNF list.    PATIENT'S/FAMILY'S RESPONSE TO PLAN OF CARE: Patient was reluctant to go to a SNF however he is agreeable to SNF search. Patient thanked CSW for coming by.

## 2012-12-06 NOTE — Progress Notes (Addendum)
Clinical Social Work Department CLINICAL SOCIAL WORK PLACEMENT NOTE 12/06/2012  Patient:  Marc [name], Marc [name]  Account Number:  [id number] Admit date:  12/03/2012  Clinical Social Worker:  Jetta Lout, Theresia Majors  Date/time:  12/06/2012 06:32 PM  Clinical Social Work is seeking post-discharge placement for this patient at the following level of care:   SKILLED NURSING   (*CSW will update this form in Epic as items are completed)   12/06/2012  Patient/family provided with [facility] Department of Clinical Social Work's list of facilities offering this level of care within the geographic area requested by the patient (or if unable, by the patient's family).  12/06/2012  Patient/family informed of their freedom to choose among providers that offer the needed level of care, that participate in Medicare, Medicaid or managed care program needed by the patient, have an available bed and are willing to accept the patient.  12/06/2012  Patient/family informed of [facility]' ownership interest in [facility], as well as of the fact that they are under no obligation to receive care at this facility.  PASARR submitted to EDS on 12/06/2012 PASARR number received from E[employer] on 12/06/2012  FL2 transmitted to all facilities in geographic area requested by pt/family on  12/06/2012 FL2 transmitted to all facilities within larger geographic area on   Patient informed that his/her managed care company has contracts with or will negotiate with  certain facilities, including the following:     Patient/family informed of bed offers received:   Patient chooses bed at [facility] Physician recommends and patient chooses bed at    Patient to be transferred to [facility] on 12/07/12   Patient to be transferred to facility by ambulance  The following physician request were entered in Epic:   Additional Comments: Patient agreeable to SNF search in [place].

## 2012-12-06 NOTE — Progress Notes (Signed)
Physical Therapy Treatment Patient Details Name: Marc [name] MRN: [id number] DOB: 20-Jan-1941 Today's Date: 12/06/2012 Time: 4098-1191 PT Time Calculation (min): 33 min  PT Assessment / Plan / Recommendation  History of Present Illness Pt is a 72 y/o male admitted s/p L4-L5 TLIF with pedicle screw fixation on 12/03/2012.    PT Comments   Pt is progressing with mobility but all mobility is very effortful & cont's to require +2 to achieve standing.  Pt states he will have 24 hr assist at home however the assist he has is a 14+ y/o male.  He seems to be unrealistic as to how much assist he is needing at home as  Pt's daughter arrives at end of session & clarifies that pt DOES NOT have 24 hr assist at home & that the male that he is referring to is very petite & will not be able to assist him as needed.  At this time, this therapist does not feel pt is safe to d/c home from mobility standpoint unless adequate assist can be provided however pt cont's to be adamant about returning home.  If pt does d/c directly home he will need 24 hr assist + HHPT.     Follow Up Recommendations  SNF;Supervision/Assistance - 24 hour;Home health PT     Does the patient have the potential to tolerate intense rehabilitation     Barriers to Discharge        Equipment Recommendations  Rolling walker with 5" wheels;3in1 (PT)    Recommendations for Other Services    Frequency Min 5X/week   Progress towards PT Goals Progress towards PT goals: Progressing toward goals  Plan Current plan remains appropriate    Precautions / Restrictions Precautions Precautions: Fall;Back Restrictions Weight Bearing Restrictions: No   Pertinent Vitals/Pain 2/10 back.      Mobility  Bed Mobility Bed Mobility: Not assessed Rolling Right: 3: Mod assist;With rail Right Sidelying to Sit: 1: +2 Total assist;HOB flat;With rails Right Sidelying to Sit: Patient Percentage: 40% Sitting - Scoot to Edge of Bed: 4: Min  guard Details for Bed Mobility Assistance: cues for sequencing & technique.  Bed pad used to assist pt into sidelying position.  (A) to lift shoulders/trunk to sitting upright & move LE's to EOB/OOB.   Transfers Transfers: Sit to Stand;Stand to Sit Sit to Stand: 1: +2 Total assist;With upper extremity assist;With armrests;From chair/3-in-1 Sit to Stand: Patient Percentage: 60% Stand to Sit: 4: Min assist;With upper extremity assist;With armrests;To chair/3-in-1 Details for Transfer Assistance: Cont's to require significant (A) to achieve standing.   Ambulation/Gait Ambulation/Gait Assistance: 4: Min assist (+2 to follow with recliner) Ambulation Distance (Feet): 100 Feet Assistive device: Rolling walker Ambulation/Gait Assistance Details: (A) for balance & safety.  Pt's knees weak & buckling at times.  Pt relies heavily on RW with UE's.  Appears very effortful for pt.   Gait Pattern: Decreased step length - right;Decreased step length - left;Shuffle;Trunk flexed Gait velocity: decreased General Gait Details: verbal cues for sequencing and RW management Stairs: Yes Stairs Assistance: 3: Mod assist (+2 for safety) Stairs Assistance Details (indicate cue type and reason): (A) to bring trunk up to next step over weak LE's.   Cues for back precautions & technique.  Pt with trunk rotation & relying heavily on UE's to pull himself up to next step.   Stair Management Technique: One rail Right;Sideways Number of Stairs: 6 Wheelchair Mobility Wheelchair Mobility: No     PT Goals (current goals can now be  found in the care plan section) Acute Rehab PT Goals PT Goal Formulation: With patient/family Time For Goal Achievement: 12/11/12 Potential to Achieve Goals: Fair  Visit Information  Last PT Received On: 12/06/12 Assistance Needed: +2 PT/OT Co-Evaluation/Treatment: Yes History of Present Illness: Pt is a 72 y/o male admitted s/p L4-L5 TLIF with pedicle screw fixation on 12/03/2012.      Subjective Data      Cognition  Cognition Arousal/Alertness: Awake/alert Behavior During Therapy: WFL for tasks assessed/performed Overall Cognitive Status: Within Functional Limits for tasks assessed    Balance     End of Session PT - End of Session Equipment Utilized During Treatment: Gait belt Activity Tolerance: Patient tolerated treatment well;Patient limited by fatigue Patient left: in chair;with call bell/phone within reach Nurse Communication: Mobility status   GP     Lara Mulch 12/06/2012, 12:58 PM  Verdell Face, PTA [phone] 12/06/2012

## 2012-12-06 NOTE — Progress Notes (Signed)
Occupational Therapy Treatment Patient Details Name: COLA HIGHFILL MRN: [id number] DOB: 1940/06/20 Today's Date: 12/06/2012 Time: 4098-1191 OT Time Calculation (min): 27 min  OT Assessment / Plan / Recommendation  History of present illness Pt is a 72 y/o male admitted s/p L4-L5 TLIF with pedicle screw fixation on 12/03/2012.    OT comments  Pt progressing towards goals. Able to ambulate to bathroom to perform grooming tasks. Is not safe for d/c home at this time. Recommending SNF for d/c.   Follow Up Recommendations  SNF    Barriers to Discharge       Equipment Recommendations  Other (comment) (TBD at next venue)    Recommendations for Other Services    Frequency Min 2X/week   Progress towards OT Goals Progress towards OT goals: Progressing toward goals  Plan Discharge plan remains appropriate    Precautions / Restrictions Precautions Precautions: Fall;Back Precaution Comments: Reviewed back precautions with pt   Pertinent Vitals/Pain 2/10 back however pt appears to be in a lot of pain with mobility. Increased activity.      ADL  Grooming: Wash/dry face;Teeth care;Brushing hair;Min guard Where Assessed - Grooming: Supported standing;Supported sitting Toilet Transfer: +2 Total assistance Toilet Transfer: Patient Percentage: 50% Toilet Transfer Method: Sit to stand (Min A for stand to sit transfer) Acupuncturist: Bedside commode Equipment Used: Gait belt;Rolling walker Transfers/Ambulation Related to ADLs: Min A for ambulation. +2 Total A/Min G/Min A for transfers. ADL Comments: Pt ambulated to bathroom and performed grooming tasks at sink. Several cues for precautions.     OT Diagnosis:    OT Problem List:   OT Treatment Interventions:     OT Goals(current goals can now be found in the care plan section) Acute Rehab OT Goals Patient Stated Goal: not stated OT Goal Formulation: Patient unable to participate in goal setting Time For Goal Achievement:  12/18/12 Potential to Achieve Goals: Good ADL Goals Pt Will Perform Grooming: with set-up;with supervision;sitting (2 tasks) Pt Will Perform Lower Body Bathing: with min assist;with adaptive equipment;sit to/from stand Pt Will Perform Lower Body Dressing: with min assist;with adaptive equipment;sit to/from stand Pt Will Transfer to Toilet: with min assist;ambulating;bedside commode Pt Will Perform Toileting - Clothing Manipulation and hygiene: with mod assist;sit to/from stand Additional ADL Goal #1: Pt will be min A to come up to sit EOB in prep for BADLs and transfers Additional ADL Goal #2: Pt will be able to verbalize 3/3 back precautions  Visit Information  Last OT Received On: 12/06/12 Assistance Needed: +2 PT/OT Co-Evaluation/Treatment: Yes History of Present Illness: Pt is a 72 y/o male admitted s/p L4-L5 TLIF with pedicle screw fixation on 12/03/2012.     Subjective Data      Prior Functioning       Cognition  Cognition Arousal/Alertness: Awake/alert Behavior During Therapy: WFL for tasks assessed/performed Overall Cognitive Status: Impaired/Different from baseline Area of Impairment: Memory Memory: Decreased recall of precautions;Decreased short-term memory    Mobility  Bed Mobility Bed Mobility: Rolling Right;Right Sidelying to Sit;Sitting - Scoot to Edge of Bed Rolling Right: 3: Mod assist;With rail Right Sidelying to Sit: 1: +2 Total assist;HOB flat;With rails Right Sidelying to Sit: Patient Percentage: 40% Sitting - Scoot to Edge of Bed: 4: Min guard Details for Bed Mobility Assistance: cues for sequencing & technique.  Bed pad used to assist pt into sidelying position.  (A) to lift shoulders/trunk to sitting upright & move LE's to EOB/OOB.   Transfers Transfers: Sit to Stand;Stand to Sit Sit  to Stand: 1: +2 Total assist;With upper extremity assist;With armrests;From chair/3-in-1;From bed Sit to Stand: Patient Percentage: 50% Stand to Sit: 4: Min assist;With  upper extremity assist;With armrests;To chair/3-in-1;4: Min guard Details for Transfer Assistance: cues for hand placement, back precautions, & technique.  (A) to achieve standing, shift weight anteriorly, stabilize RW, & control descent.  Min guard to sit in recliner chair.     Exercises      Balance     End of Session OT - End of Session Equipment Utilized During Treatment: Gait belt;Rolling walker Activity Tolerance: Patient limited by pain Patient left: in chair;with call bell/phone within reach  GO     Earlie Raveling OTR/L [phone] 12/06/2012, 5:16 PM

## 2012-12-06 NOTE — Progress Notes (Signed)
Physical Therapy Treatment Patient Details Name: Marc [name] MRN: [id number] DOB: 18-Dec-1940 Today's Date: 12/06/2012 Time: 4098-1191 PT Time Calculation (min): 38 min  PT Assessment / Plan / Recommendation  History of Present Illness Pt is a 72 y/o male admitted s/p L4-L5 TLIF with pedicle screw fixation on 12/03/2012.    PT Comments   Pt progressing slowly with mobility.  Cont's to require +2 assist for bed mobility & sit>stand transfers.  Pt adamant about returning home at d/c & is very eager to d/c home today but at this time this clinician feels pt would greatly benefit from another PT session to progress mobility before going home.   Spoke with MD after PT session.  Pt does state he will have +2 assist at all times but he also states he needs to be able to walk ~30' to get from room>room in house which pt only ambulating ~10' x 2 at time at this date.     Follow Up Recommendations  SNF;Supervision/Assistance - 24 hour;Home health PT     Does the patient have the potential to tolerate intense rehabilitation     Barriers to Discharge        Equipment Recommendations  Rolling walker with 5" wheels;3in1 (PT)    Recommendations for Other Services    Frequency Min 5X/week   Progress towards PT Goals Progress towards PT goals: Progressing toward goals (slowly)  Plan Current plan remains appropriate    Precautions / Restrictions Precautions Precautions: Fall;Back Restrictions Weight Bearing Restrictions: No   Pertinent Vitals/Pain 2/10 back however pt appears to be in a lot of pain with mobility.      Mobility  Bed Mobility Bed Mobility: Rolling Right;Right Sidelying to Sit;Sitting - Scoot to Edge of Bed Rolling Right: 3: Mod assist;With rail Right Sidelying to Sit: 1: +2 Total assist;HOB flat;With rails Right Sidelying to Sit: Patient Percentage: 40% Sitting - Scoot to Edge of Bed: 4: Min guard Details for Bed Mobility Assistance: cues for sequencing & technique.  Bed  pad used to assist pt into sidelying position.  (A) to lift shoulders/trunk to sitting upright & move LE's to EOB/OOB.   Transfers Transfers: Sit to Stand;Stand to Sit Sit to Stand: 1: +2 Total assist;With upper extremity assist;With armrests;From bed;From chair/3-in-1 Sit to Stand: Patient Percentage: 50% Stand to Sit: 4: Min assist;With upper extremity assist;With armrests;To chair/3-in-1 Details for Transfer Assistance: cues for hand placement, back precautions, & technique.  (A) to achieve standing, shift weight anteriorly, stabilize RW, & control descent.   Ambulation/Gait Ambulation/Gait Assistance: 4: Min assist Ambulation Distance (Feet): 20 Feet (10' x 2) Assistive device: Rolling walker Ambulation/Gait Assistance Details: Cues for posture & safe use of RW.   Gait Pattern: Step-to pattern;Decreased step length - left;Decreased step length - right;Decreased weight shift to right;Decreased weight shift to left;Trunk flexed;Shuffle Gait velocity: decreased General Gait Details: verbal cues for sequencing and RW management Stairs: No Wheelchair Mobility Wheelchair Mobility: No      PT Goals (current goals can now be found in the care plan section) Acute Rehab PT Goals PT Goal Formulation: With patient/family Time For Goal Achievement: 12/11/12 Potential to Achieve Goals: Fair  Visit Information  Last PT Received On: 12/06/12 Assistance Needed: +2 PT/OT Co-Evaluation/Treatment: Yes History of Present Illness: Pt is a 72 y/o male admitted s/p L4-L5 TLIF with pedicle screw fixation on 12/03/2012.     Subjective Data      Cognition  Cognition Arousal/Alertness: Awake/alert Behavior During Therapy: WFL for tasks  assessed/performed Overall Cognitive Status: Within Functional Limits for tasks assessed    Balance     End of Session PT - End of Session Equipment Utilized During Treatment: Gait belt Activity Tolerance: Patient limited by fatigue;Patient limited by  pain Patient left: in chair;with call bell/phone within reach Nurse Communication: Mobility status   GP     Lara Mulch 12/06/2012, 9:08 AM   Verdell Face, PTA [phone] 12/06/2012

## 2012-12-07 LAB — GLUCOSE, CAPILLARY
Glucose-Capillary: 140 mg/dL — ABNORMAL HIGH (ref 70–99)
Glucose-Capillary: 167 mg/dL — ABNORMAL HIGH (ref 70–99)
Glucose-Capillary: 181 mg/dL — ABNORMAL HIGH (ref 70–99)

## 2012-12-07 MED ORDER — FLEET ENEMA 7-19 GM/118ML RE ENEM
1.0000 | ENEMA | Freq: Once | RECTAL | Status: AC | PRN
  Administered 2012-12-07: 1 via RECTAL
  Filled 2012-12-07: qty 1

## 2012-12-07 NOTE — Progress Notes (Signed)
Pt doing well, leg pain still resolved. C/O ongoing LBP and substantial stiffness, has not been up yet this morning and just took morning pain medications. 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 He is not enthused about SNF but understands need and is very willing to comply and progress with PT. He also c/o constipation, has not has BM since Thursday. Eating well. Denies severe abdominal pain, N/V.  BP 149/56  Pulse 70  Temp(Src) 99.2 F (37.3 C) (Oral)  Resp 18  Ht 6\' 2"  (1.88 m)  Wt 95.255 kg (210 lb)  BMI 26.95 kg/m2  SpO2 95%  Pt laying in hospital bed, SCD's in place, - Homans, calves soft/nontender, dressing CDI, LB TTP,  5/5 strength DFLX/PFLX. Abdomen slightly distended.   4 Days PO L4-5 TLIF, resolved leg pain, expected LBP  -Residual weakness/difficulties with transfers/ambulation   -Cont PT    -Back Precautions   -Transfer to SNF when available   -Cont SCD's  -D/C to SNF when bed available   -Written scripts and D/C instructions in chart   -Constipation   -Enema if no BM by noon   -F/U in office 2wks

## 2012-12-07 NOTE — Progress Notes (Signed)
Inpatient Diabetes Program Recommendations  AACE/ADA: New Consensus Statement on Inpatient Glycemic Control (2013)  Target Ranges:  Prepandial:   less than 140 mg/dL      Peak postprandial:   less than 180 mg/dL (1-2 hours)      Critically ill patients:  140 - 180 mg/dL   Reason for Visit:  Results for ANDRIAN, SABALA (MRN [id number]) as of 12/07/2012 14:34  Ref. Range 12/06/2012 21:22 12/07/2012 06:53 12/07/2012 11:12  Glucose-Capillary Latest Range: 70-99 mg/dL 409 (H) 811 (H) 914 (H)   Please consider adding Novolog sensitive correction tid with meals, while patient is in the hospital.  Beryl Meager, RN, BC-ADM Inpatient Diabetes Coordinator Pager [phone]

## 2012-12-07 NOTE — Progress Notes (Signed)
Occupational Therapy Treatment Patient Details Name: Marc [name] MRN: [id number] DOB: Feb 20, 1940 Today's Date: 12/07/2012 Time: 1324-4010 OT Time Calculation (min): 32 min  OT Assessment / Plan / Recommendation  History of present illness Pt is a 72 y/o male admitted s/p L4-L5 TLIF with pedicle screw fixation on 12/03/2012.    OT comments  This 72 yo continues to make progress since evaluation session however slowly. Will benefit from follow up OT at SNF.  Follow Up Recommendations  SNF       Equipment Recommendations   (TBD at next venue)       Frequency Min 2X/week   Progress towards OT Goals Progress towards OT goals: Progressing toward goals  Plan Discharge plan remains appropriate    Precautions / Restrictions Precautions Precautions: Back;Fall Precaution Comments: Reviewed back precautions with pt Required Braces or Orthoses:  (None - RN clarified with MD on Friday) Restrictions Weight Bearing Restrictions: No   Pertinent Vitals/Pain C/o pain in back but never rated    ADL  Grooming: Set up;Shaving;Brushing hair Where Assessed - Grooming: Supported sitting Lower Body Dressing:  (with AE) Where Assessed - Lower Body Dressing: Supported sit to stand ADL Comments: Educated pt on use of AE and he practiced with them for doffing and donning socks; made him and family aware where they could get AE.      OT Goals(current goals can now be found in the care plan section) Acute Rehab OT Goals Patient Stated Goal: not stated  Visit Information  Last OT Received On: 12/07/12 Assistance Needed: +1 History of Present Illness: Pt is a 72 y/o male admitted s/p L4-L5 TLIF with pedicle screw fixation on 12/03/2012.           Cognition  Cognition Arousal/Alertness: Awake/alert Behavior During Therapy: WFL for tasks assessed/performed Area of Impairment: Memory Memory: Decreased recall of precautions;Decreased short-term memory             End of Session OT -  End of Session Equipment Utilized During Treatment:  (AE) Activity Tolerance: Patient tolerated treatment well Patient left: in chair;with call bell/phone within reach;with family/visitor present       Evette Georges [phone] 12/07/2012, 1:07 PM

## 2012-12-07 NOTE — Progress Notes (Signed)
   CARE MANAGEMENT NOTE 12/07/2012  Patient:  Marc [name], Marc [name]   Account Number:  [id number]  Date Initiated:  12/03/2012  Documentation initiated by:  [facility]  Subjective/Objective Assessment:   Left sided lumbar 4-5 Transforaminal lumbar interbody fusion     Action/Plan:   waiting PT/OT   Anticipated DC Date:  12/07/2012   Anticipated DC Plan:  HOME W HOME HEALTH SERVICES  In-house referral  Clinical Social Worker      DC Planning Services  CM consult      Choice offered to / List presented to:             Status of service:  Completed, signed off Medicare Important Message given?   (If response is "NO", the following Medicare IM given date fields will be blank) Date Medicare IM given:   Date Additional Medicare IM given:    Discharge Disposition:  SKILLED NURSING FACILITY  Per UR Regulation:    If discussed at Long Length of Stay Meetings, dates discussed:    Comments:  12/07/2012 1144 Scheduled dc to SNF when bed available. Isidoro Donning RN CCM Case Mgmt phone [phone]

## 2012-12-07 NOTE — Progress Notes (Signed)
Physical Therapy Treatment Patient Details Name: Marc [name] MRN: [id number] DOB: Dec 05, 1940 Today's Date: 12/07/2012 Time: 4098-1191 PT Time Calculation (min): 26 min  PT Assessment / Plan / Recommendation  History of Present Illness Pt is a 72 y/o male admitted s/p L4-L5 TLIF with pedicle screw fixation on 12/03/2012.    PT Comments   This patient is making progress towards physical therapy goals. This session pt was able to improve ambulation distance, with decreased VC's for posture. Walker was adjusted so pt could maintain back precautions easier, and he demonstrated the ability to maintain static standing with stand-by-assist without use of the walker.  Follow Up Recommendations  SNF;Supervision/Assistance - 24 hour;Home health PT     Does the patient have the potential to tolerate intense rehabilitation     Barriers to Discharge        Equipment Recommendations  Rolling walker with 5" wheels;3in1 (PT)    Recommendations for Other Services    Frequency Min 5X/week   Progress towards PT Goals Progress towards PT goals: Progressing toward goals  Plan Current plan remains appropriate    Precautions / Restrictions Precautions Precautions: Back;Fall Precaution Comments: Reviewed back precautions with pt Required Braces or Orthoses:  (None - RN clarified with MD on Friday) Restrictions Weight Bearing Restrictions: No   Pertinent Vitals/Pain 8/10 after ambulation    Mobility  Bed Mobility Bed Mobility: Rolling Right;Supine to Sit;Sitting - Scoot to Edge of Bed Rolling Right: 1: +2 Total assist;With rail Rolling Right: Patient Percentage: 30% Right Sidelying to Sit: 1: +2 Total assist;HOB elevated Right Sidelying to Sit: Patient Percentage: 50% Sitting - Scoot to Edge of Bed: 4: Min guard Details for Bed Mobility Assistance: VC's for sequencing and to maintain back precautions. Bed pad was used to assist pt as he rolled to his R side prior to sitting up.   Transfers Transfers: Sit to Stand;Stand to Sit Sit to Stand: 1: +2 Total assist;From bed;With upper extremity assist Sit to Stand: Patient Percentage: 50% Stand to Sit: 4: Min assist;To chair/3-in-1;With upper extremity assist Details for Transfer Assistance: VC's for hand placement and to maintain back precautions. Ambulation/Gait Ambulation/Gait Assistance: 4: Min guard Ambulation Distance (Feet): 125 Feet Assistive device: Rolling walker Ambulation/Gait Assistance Details: VC's for improved posture and to maintain back precautions. Gait Pattern: Step-through pattern;Decreased stride length;Trunk flexed Gait velocity: decreased Stairs: No    Exercises     PT Diagnosis:    PT Problem List:   PT Treatment Interventions:     PT Goals (current goals can now be found in the care plan section) Acute Rehab PT Goals Patient Stated Goal: not stated PT Goal Formulation: With patient/family Time For Goal Achievement: 12/11/12 Potential to Achieve Goals: Fair  Visit Information  Last PT Received On: 12/07/12 Assistance Needed: +1 History of Present Illness: Pt is a 72 y/o male admitted s/p L4-L5 TLIF with pedicle screw fixation on 12/03/2012.     Subjective Data  Subjective: "I hurt! Can I get some pain medicine?" Patient Stated Goal: not stated   Cognition  Cognition Arousal/Alertness: Awake/alert Behavior During Therapy: WFL for tasks assessed/performed Overall Cognitive Status: Within Functional Limits for tasks assessed Area of Impairment: Memory Memory: Decreased recall of precautions;Decreased short-term memory    Balance  Balance Balance Assessed: Yes Static Sitting Balance Static Sitting - Balance Support: Feet supported;Bilateral upper extremity supported Static Sitting - Level of Assistance: 4: Min assist;5: Stand by assistance Static Sitting - Comment/# of Minutes: 5 minutes at EOB  End  of Session PT - End of Session Equipment Utilized During Treatment: Gait  belt Activity Tolerance: Patient tolerated treatment well;Patient limited by pain Patient left: in chair;with call bell/phone within reach Nurse Communication: Mobility status   GP     Ruthann Cancer 12/07/2012, 1:05 PM  Ruthann Cancer, PT, DPT [phone]

## 2012-12-08 ENCOUNTER — Non-Acute Institutional Stay (SKILLED_NURSING_FACILITY): Payer: Medicare Other | Admitting: Adult Health

## 2012-12-08 DIAGNOSIS — I10 Essential (primary) hypertension: Secondary | ICD-10-CM

## 2012-12-08 DIAGNOSIS — E785 Hyperlipidemia, unspecified: Secondary | ICD-10-CM

## 2012-12-08 DIAGNOSIS — M109 Gout, unspecified: Secondary | ICD-10-CM

## 2012-12-08 DIAGNOSIS — E119 Type 2 diabetes mellitus without complications: Secondary | ICD-10-CM

## 2012-12-08 DIAGNOSIS — K5900 Constipation, unspecified: Secondary | ICD-10-CM

## 2012-12-08 DIAGNOSIS — K219 Gastro-esophageal reflux disease without esophagitis: Secondary | ICD-10-CM

## 2012-12-08 DIAGNOSIS — M48061 Spinal stenosis, lumbar region without neurogenic claudication: Secondary | ICD-10-CM

## 2012-12-09 ENCOUNTER — Non-Acute Institutional Stay (SKILLED_NURSING_FACILITY): Payer: Medicare Other | Admitting: Internal Medicine

## 2012-12-09 DIAGNOSIS — D62 Acute posthemorrhagic anemia: Secondary | ICD-10-CM

## 2012-12-09 DIAGNOSIS — M48061 Spinal stenosis, lumbar region without neurogenic claudication: Secondary | ICD-10-CM

## 2012-12-09 DIAGNOSIS — I10 Essential (primary) hypertension: Secondary | ICD-10-CM

## 2012-12-09 DIAGNOSIS — E119 Type 2 diabetes mellitus without complications: Secondary | ICD-10-CM

## 2012-12-28 ENCOUNTER — Other Ambulatory Visit: Payer: Self-pay | Admitting: *Deleted

## 2012-12-28 MED ORDER — OXYCODONE-ACETAMINOPHEN 5-325 MG PO TABS: ORAL_TABLET | ORAL | Status: DC

## 2013-01-01 ENCOUNTER — Non-Acute Institutional Stay (SKILLED_NURSING_FACILITY): Payer: Medicare Other | Admitting: Adult Health

## 2013-01-01 DIAGNOSIS — E119 Type 2 diabetes mellitus without complications: Secondary | ICD-10-CM

## 2013-01-01 DIAGNOSIS — E785 Hyperlipidemia, unspecified: Secondary | ICD-10-CM

## 2013-01-01 DIAGNOSIS — M109 Gout, unspecified: Secondary | ICD-10-CM

## 2013-01-01 DIAGNOSIS — K5900 Constipation, unspecified: Secondary | ICD-10-CM

## 2013-01-01 DIAGNOSIS — M48061 Spinal stenosis, lumbar region without neurogenic claudication: Secondary | ICD-10-CM

## 2013-01-01 DIAGNOSIS — K219 Gastro-esophageal reflux disease without esophagitis: Secondary | ICD-10-CM

## 2013-01-01 DIAGNOSIS — I10 Essential (primary) hypertension: Secondary | ICD-10-CM

## 2013-01-14 DIAGNOSIS — D62 Acute posthemorrhagic anemia: Secondary | ICD-10-CM | POA: Insufficient documentation

## 2013-01-14 DIAGNOSIS — M48061 Spinal stenosis, lumbar region without neurogenic claudication: Secondary | ICD-10-CM | POA: Insufficient documentation

## 2013-01-14 NOTE — Progress Notes (Signed)
Patient ID: Marc [name], male   DOB: 10/17/1940, 72 y.o.   MRN: [id number]        HISTORY & PHYSICAL  DATE: 12/09/2012     FACILITY: [facility]  LEVEL OF CARE: SNF (31)  ALLERGIES:  No Known Allergies  CHIEF COMPLAINT:  Manage lumbar spinal stenosis, hypertension, and diabetes mellitus.    HISTORY OF PRESENT ILLNESS:  The patient is a 72 year-old, Caucasian male.    SPINAL STENOSIS:   The patient underwent left-sided lumbar 4-5 transforaminal interbody fusion with instrumentation and allograft.  Patient's spinal stenosis remains stable. Patient denies ongoing low back pain, numbness, tingling or weakness. No complications reported from the medications currently being used.    HTN: Pt 's HTN remains stable.  Denies CP, sob, DOE, pedal edema, headaches, dizziness or visual disturbances.  No complications from the medications currently being used.  Last BP :   162/81.    DM:pt's DM remains stable.  Pt denies polyuria, polydipsia, polyphagia, changes in vision or hypoglycemic episodes.  No complications noted from the medication presently being used.  Last hemoglobin A1c is:   Not available.    PAST MEDICAL HISTORY :  Past Medical History  Diagnosis Date  . Hypertension   . Ulcer disease   . Colon polyp   . Gout   . Bradycardia   . Hypercholesteremia   . PAD (peripheral artery disease)   . Chronic bronchitis     "used to be very susceptible when I was a kid & smoking" (02/04/2012)  . Type II diabetes mellitus   . History of blood transfusion ~ 2007    "10 of them when I had stomach bleeding"  . Bleeding stomach ulcer 2007  . Arthritis     "qwhere that you can get it; mostly in my shoulders" (02/04/2012)  . Gout   . S/P angioplasty with stent, to distal RT ext. iliac artery 03/10/2012  . Tobacco abuse disorder 03/10/2012  . GERD (gastroesophageal reflux disease)   . Muscle spasms of neck     has spasms over body when get up from sitting    PAST SURGICAL  HISTORY: Past Surgical History  Procedure Laterality Date  . Splenectomy  ~ 1957  . Anterior cervical decomp/discectomy fusion  ~ 2007  . Upper endoscopy w/ sclerotherapy  ~ 2007  . Polypectomy    . Colonoscopy    . Iliac artery stent      left common/notes (02/04/2012)  . Hernia repair  ~ 2007    UHR (02/04/2012)  . Tonsillectomy  ~ 1947  . Sfa    . Sfa  03/10/2012    ILLEAC STENOSIS    SOCIAL HISTORY:  reports that he has been smoking Cigarettes.  He has a 116 pack-year smoking history. He has quit using smokeless tobacco. His smokeless tobacco use included Chew. He reports that he drinks about 1.2 ounces of alcohol per week. He reports that he does not use illicit drugs.  FAMILY HISTORY:  Family History  Problem Relation Age of Onset  . Colon cancer Paternal Uncle     Uncle  . Heart disease Father   . Heart disease Mother     CURRENT MEDICATIONS: Reviewed per [facility]  REVIEW OF SYSTEMS:   GI:  Complains of constipation.   MUSCULOSKELETAL:  Complains of left lower extremity pain.    See HPI otherwise 14 point ROS is negative.  PHYSICAL EXAMINATION  VS:  T 99.3  P 77      RR 20      BP 162/81      POX 91%        WT (Lb)  GENERAL: no acute distress, normal body habitus EYES: conjunctivae normal, sclerae normal, normal eye lids MOUTH/THROAT: lips without lesions,no lesions in the mouth,tongue is without lesions,uvula elevates in midline NECK: supple, trachea midline, no neck masses, no thyroid tenderness, no thyromegaly LYMPHATICS: no LAN in the neck, no supraclavicular LAN RESPIRATORY: breathing is even & unlabored, BS CTAB CARDIAC: RRR, no murmur,no extra heart sounds, no edema GI:  ABDOMEN: abdomen soft, normal BS, no masses, no tenderness  LIVER/SPLEEN: no hepatomegaly, no splenomegaly MUSCULOSKELETAL: HEAD: normal to inspection & palpation BACK: no kyphosis, scoliosis or spinal processes tenderness EXTREMITIES: LEFT UPPER EXTREMITY: range of motion minimal,  strength intact   RIGHT UPPER EXTREMITY: range of motion minimal, strength intact   LEFT LOWER EXTREMITY: strength intact, range of motion unable to perform       RIGHT LOWER EXTREMITY: strength intact, range of motion unable to perform   PSYCHIATRIC: the patient is alert & oriented to person, affect & behavior appropriate  LABS/RADIOLOGY: Hemoglobin 9.9, MCV 100.9, otherwise CBC normal.    Glucose 196, otherwise BMP normal.    Chest x-ray:  No acute disease.    Lumbar spine x-ray postsurgically:  Showed PLIF of L4-5.    Labs reviewed: Basic Metabolic Panel:  Recent Labs  47/82/95 0818 03/11/12 0620 11/27/12 1314  NA 141 143 141  K 4.2 4.3 5.1  CL 104 107 106  CO2 26 29 25   GLUCOSE 200* 117* 143*  BUN 20 19 28*  CREATININE 1.21 1.33 1.88*  CALCIUM 9.8 10.0 9.6   Liver Function Tests:  Recent Labs  11/27/12 1314  AST 12  ALT <5  ALKPHOS 69  BILITOT 0.2*  PROT 7.0  ALBUMIN 3.7   CBC:  Recent Labs  02/05/12 0645 03/11/12 0620 11/27/12 1314  WBC 7.5 8.2 8.4  NEUTROABS  --   --  5.5  HGB 13.3 13.2 13.1  HCT 41.5 40.0 38.1*  MCV 100.0 99.5 97.7  PLT 198 197 195    CBG:  Recent Labs  12/07/12 0653 12/07/12 1112 12/07/12 1633  GLUCAP 140* 167* 181*    ASSESSMENT/PLAN:  Lumbar spinal stenosis.  Status post fusion.  Continue rehabilitation and OxyContin 10 mg b.i.d. secondary to uncontrolled pain.    Hypertension.  Blood pressure elevated.  Likely due to pain.  We will review a log.    Diabetes mellitus.  Continue current medications.    Acute blood loss anemia.  Reassess.    Macrocytosis.  Check RBC folate and vitamin B12 level.    Constipation.  Likely secondary to pain medications.  Start MiraLAX 17 g q.d.    History of peptic ulcer disease.  Continue omeprazole.    I have reviewed patient's medical records received at admission/from hospitalization.  CPT CODE: 62130

## 2013-01-26 ENCOUNTER — Encounter (HOSPITAL_COMMUNITY): Payer: Self-pay | Admitting: Pharmacy Technician

## 2013-01-26 ENCOUNTER — Other Ambulatory Visit: Payer: Self-pay | Admitting: Orthopedic Surgery

## 2013-02-01 NOTE — Pre-Procedure Instructions (Addendum)
DEXTER SIGNOR  02/01/2013   Your procedure is scheduled on:  Wed, Jan 7 @ 12:45 PM  Report to [facility]  at 10:45 AM.  Call this number if you have problems the morning of surgery: [phone]   Remember:   Do not eat food or drink liquids after midnight.   Take these medicines the morning of surgery with A SIP OF WATER: Allopurinol(Zyloprim),Gabapentin(Neurontin),Prilosec(Omeprazole),and Pain Pill(if needed)              Stop taking your Aspirin. No Goody's,BC's,Aleve,Ibuprofen,Fish Oil,or any Herbal Medications   Do not wear jewelry  Do not wear lotions, powders, or colognes. You may wear deodorant.  Men may shave face and neck.  Do not bring valuables to the hospital.  [facility] is not responsible                  for any belongings or valuables.               Contacts, dentures or bridgework may not be worn into surgery.  Leave suitcase in the car. After surgery it may be brought to your room.  For patients admitted to the hospital, discharge time is determined by your                treatment team.               Patients discharged the day of surgery will not be allowed to drive  home.    Special Instructions: Shower using CHG 2 nights before surgery and the night before surgery.  If you shower the day of surgery use CHG.  Use special wash - you have one bottle of CHG for all showers.  You should use approximately 1/3 of the bottle for each shower.   Please read over the following fact sheets that you were given: Pain Booklet, Coughing and Deep Breathing and Surgical Site Infection Prevention

## 2013-02-02 ENCOUNTER — Encounter (HOSPITAL_COMMUNITY)
Admission: RE | Admit: 2013-02-02 | Discharge: 2013-02-02 | Disposition: A | Discharge status: Home / Self Care | Payer: Medicare Other | Source: Ambulatory Visit | Attending: Orthopedic Surgery | Admitting: Orthopedic Surgery

## 2013-02-02 ENCOUNTER — Encounter (HOSPITAL_COMMUNITY): Payer: Self-pay

## 2013-02-02 DIAGNOSIS — Z01818 Encounter for other preprocedural examination: Secondary | ICD-10-CM | POA: Insufficient documentation

## 2013-02-02 DIAGNOSIS — Z01812 Encounter for preprocedural laboratory examination: Secondary | ICD-10-CM | POA: Insufficient documentation

## 2013-02-02 LAB — BASIC METABOLIC PANEL
BUN: 22 mg/dL (ref 6–23)
CALCIUM: 9.5 mg/dL (ref 8.4–10.5)
CO2: 25 mEq/L (ref 19–32)
CREATININE: 1.4 mg/dL — AB (ref 0.50–1.35)
Chloride: 104 mEq/L (ref 96–112)
GFR calc Af Amer: 56 mL/min — ABNORMAL LOW (ref >90)
GFR, EST NON AFRICAN AMERICAN: 48 mL/min — AB (ref >90)
Glucose, Bld: 104 mg/dL — ABNORMAL HIGH (ref 70–99)
Potassium: 4.6 mEq/L (ref 3.7–5.3)
Sodium: 141 mEq/L (ref 137–147)

## 2013-02-02 LAB — URINALYSIS, ROUTINE W REFLEX MICROSCOPIC
BILIRUBIN URINE: NEGATIVE
Glucose, UA: NEGATIVE mg/dL
HGB URINE DIPSTICK: NEGATIVE
Ketones, ur: NEGATIVE mg/dL
Leukocytes, UA: NEGATIVE
Nitrite: NEGATIVE
Protein, ur: 30 mg/dL — AB
SPECIFIC GRAVITY, URINE: 1.022 (ref 1.005–1.030)
Urobilinogen, UA: 2 mg/dL — ABNORMAL HIGH (ref 0.0–1.0)
pH: 5.5 (ref 5.0–8.0)

## 2013-02-02 LAB — SURGICAL PCR SCREEN
MRSA, PCR: NEGATIVE
Staphylococcus aureus: NEGATIVE

## 2013-02-02 LAB — CBC WITH DIFFERENTIAL/PLATELET
BASOS ABS: 0 10*3/uL (ref 0.0–0.1)
Basophils Relative: 1 % (ref 0–1)
Eosinophils Absolute: 0.3 10*3/uL (ref 0.0–0.7)
Eosinophils Relative: 5 % (ref 0–5)
HCT: 37.3 % — ABNORMAL LOW (ref 39.0–52.0)
Hemoglobin: 12.1 g/dL — ABNORMAL LOW (ref 13.0–17.0)
LYMPHS PCT: 26 % (ref 12–46)
Lymphs Abs: 1.7 10*3/uL (ref 0.7–4.0)
MCH: 31.4 pg (ref 26.0–34.0)
MCHC: 32.4 g/dL (ref 30.0–36.0)
MCV: 96.9 fL (ref 78.0–100.0)
Monocytes Absolute: 0.5 10*3/uL (ref 0.1–1.0)
Monocytes Relative: 7 % (ref 3–12)
NEUTROS ABS: 4 10*3/uL (ref 1.7–7.7)
NEUTROS PCT: 61 % (ref 43–77)
PLATELETS: 191 10*3/uL (ref 150–400)
RBC: 3.85 MIL/uL — ABNORMAL LOW (ref 4.22–5.81)
RDW: 14.5 % (ref 11.5–15.5)
WBC: 6.5 10*3/uL (ref 4.0–10.5)

## 2013-02-02 LAB — TYPE AND SCREEN
ABO/RH(D): O NEG
Antibody Screen: NEGATIVE

## 2013-02-02 LAB — URINE MICROSCOPIC-ADD ON
RBC / HPF: NONE SEEN RBC/hpf (ref <3)
WBC, UA: NONE SEEN WBC/hpf (ref <3)

## 2013-02-02 LAB — PROTIME-INR
INR: 0.99 (ref 0.00–1.49)
PROTHROMBIN TIME: 12.9 s (ref 11.6–15.2)

## 2013-02-02 LAB — APTT: aPTT: 34 seconds (ref 24–37)

## 2013-02-02 MED ORDER — CEFAZOLIN SODIUM-DEXTROSE 2-3 GM-% IV SOLR
2.0000 g | INTRAVENOUS | Status: AC
  Administered 2013-02-03: 2 g via INTRAVENOUS

## 2013-02-02 NOTE — H&P (Signed)
TOTAL KNEE ADMISSION H&P  Patient is being admitted for right total knee arthroplasty.  Subjective:  Chief Complaint:right knee pain.  HPI: Marc [name], 73 y.o. male, has a history of pain and functional disability in the right knee due to arthritis and has failed non-surgical conservative treatments for greater than 12 weeks to includeNSAID's and/or analgesics, corticosteriod injections, use of assistive devices and activity modification.  Onset of symptoms was gradual, starting several years ago with gradually worsening course since that time. The patient noted no past surgery on the right knee(s).  Patient currently rates pain in the right knee(s) at 10 out of 10 with activity. Patient has worsening of pain with activity and weight bearing, pain that interferes with activities of daily living and joint swelling.  Patient has evidence of subchondral cysts, subchondral sclerosis and joint space narrowing by imaging studies. There is no active infection.  Patient Active Problem List   Diagnosis Date Noted  . Acute posthemorrhagic anemia 01/14/2013  . Spinal stenosis of lumbar region 01/14/2013  . Claudication in peripheral vascular disease, life style limiting 03/10/2012  . S/P angioplasty with stent, to distal RT ext. iliac artery 03/10/2012  . Tobacco abuse disorder 03/10/2012  . PVD- s/p  PTA and stenting of left common/external iliac artery 1/7 02/05/2012  . Bradycardia:  Normal condition for this patient. Previously worked up. 02/05/2012  . Diabetes mellitus 05/21/2010  . Hypertension 05/21/2010  . Degenerative disc disease, lumbar 05/21/2010  . Hx of peptic ulcer 05/21/2010  . Hx of adenomatous colonic polyps 05/21/2010   Past Medical History  Diagnosis Date  . Hypertension   . Ulcer disease   . Colon polyp   . Gout   . Bradycardia   . Hypercholesteremia   . PAD (peripheral artery disease)   . Chronic bronchitis     "used to be very susceptible when I was a kid &  smoking" (02/04/2012)  . Type II diabetes mellitus   . History of blood transfusion ~ 2007    "10 of them when I had stomach bleeding"  . Bleeding stomach ulcer 2007  . Arthritis     "qwhere that you can get it; mostly in my shoulders" (02/04/2012)  . Gout   . S/P angioplasty with stent, to distal RT ext. iliac artery 03/10/2012  . Tobacco abuse disorder 03/10/2012  . GERD (gastroesophageal reflux disease)   . Muscle spasms of neck     has spasms over body when get up from sitting  . Pneumonia     Past Surgical History  Procedure Laterality Date  . Splenectomy  ~ 1957  . Anterior cervical decomp/discectomy fusion  ~ 2007  . Upper endoscopy w/ sclerotherapy  ~ 2007  . Polypectomy    . Colonoscopy    . Iliac artery stent      left common/notes (02/04/2012)  . Hernia repair  ~ 2007    UHR (02/04/2012)  . Tonsillectomy  ~ 1947  . Sfa    . Sfa  03/10/2012    ILLEAC STENOSIS  . Back surgery  11/14    No prescriptions prior to admission   No Known Allergies  History  Substance Use Topics  . Smoking status: Current Every Day Smoker -- 2.00 packs/day for 58 years    Types: Cigarettes  . Smokeless tobacco: Former Systems developer    Types: Chew     Comment: occ alcohol  . 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 Colon cancer Paternal Uncle     Uncle  . Heart disease Father   . Heart disease Mother      Review of Systems  Constitutional: Negative.   HENT: Negative.   Eyes: Negative.   Respiratory: Negative.   Cardiovascular: Negative.   Gastrointestinal: Negative.   Genitourinary: Negative.   Musculoskeletal: Positive for back pain.  Skin: Negative.   Neurological: Negative.   Endo/Heme/Allergies: Negative.   Psychiatric/Behavioral: Negative.     Objective:  Physical Exam  Constitutional: He is oriented to person, place,  and time. He appears well-developed and well-nourished.  HENT:  Head: Normocephalic and atraumatic.  Eyes: Pupils are equal, round, and reactive to light.  Neck: Normal range of motion. Neck supple.  Cardiovascular: Intact distal pulses.   Respiratory: Effort normal.  Musculoskeletal:  Patient's left knee has good strength good range of motion.  Patient's right knee has a range from approximately 5 to 100.  He does have obvious medial joint line tenderness.  No noticeable instability.  No pain with valgus or varus stress.  He does have mild peripatellar pain.  Patient has no noticeable laxity.  Patient does have a small abrasion over the prepatellar region and no erythema or warmth and no signs of infection.  His calves are soft and nontender.  Neurological: He is alert and oriented to person, place, and time.  Skin: Skin is warm and dry.  Psychiatric: He has a normal mood and affect. His behavior is normal. Judgment and thought content normal.    Vital signs in last 24 hours: Temp:  [98.1 F (36.7 C)] 98.1 F (36.7 C) (01/06 0859) Pulse Rate:  [56] 56 (01/06 0859) Resp:  [20] 20 (01/06 0859) BP: (163)/(72) 163/72 mmHg (01/06 0859) SpO2:  [98 %] 98 % (01/06 0859) Weight:  [90.266 kg (199 lb)] 90.266 kg (199 lb) (01/06 0859)  Labs:   Estimated body mass index is 26.97 kg/(m^2) as calculated from the following:   Height as of 12/05/12: 6\' 2"  (1.88 m).   Weight as of 11/27/12: 95.312 kg (210 lb 2 oz).   Imaging Review Radiographs:  X-rays were ordered, performed, and interpreted by myself and Dr. [name] today included; bilateral AP weightbearing, bilateral Lutricia Feil, lateral sunrise views of the right knee are taken and reviewed in office today.  No fracture dislocation subluxation or tumors identified.  Patient does have obvious medial joint space narrowing.  Patient has severe patellofemoral arthritis.  Assessment/Plan:  End stage arthritis, right knee   The patient history,  physical examination, clinical judgment of the provider and imaging studies are consistent with end stage degenerative joint disease of the right knee(s) and total knee arthroplasty is deemed medically necessary. The treatment options including medical management, injection therapy arthroscopy and arthroplasty were discussed at length. The risks and benefits of total knee arthroplasty were presented and reviewed. The risks due to aseptic loosening, infection, stiffness, patella tracking problems, thromboembolic complications and other imponderables were discussed. The patient acknowledged the explanation, agreed to proceed with the plan and consent was signed. Patient is being admitted for inpatient treatment for surgery, pain control, PT, OT, prophylactic antibiotics, VTE prophylaxis, progressive ambulation and ADL's and discharge planning. The patient is planning to be discharged home with home health services

## 2013-02-03 ENCOUNTER — Encounter (HOSPITAL_COMMUNITY)
Admission: RE | Disposition: A | Discharge status: Skilled Nursing Facility | Payer: Self-pay | Source: Ambulatory Visit | Attending: Orthopedic Surgery

## 2013-02-03 ENCOUNTER — Inpatient Hospital Stay (HOSPITAL_COMMUNITY)
Admission: RE | Admit: 2013-02-03 | Discharge: 2013-02-08 | DRG: 470 | Disposition: A | Discharge status: Skilled Nursing Facility | Payer: Medicare Other | Source: Ambulatory Visit | Attending: Orthopedic Surgery | Admitting: Orthopedic Surgery

## 2013-02-03 ENCOUNTER — Encounter (HOSPITAL_COMMUNITY): Payer: Self-pay | Admitting: Anesthesiology

## 2013-02-03 ENCOUNTER — Encounter (HOSPITAL_COMMUNITY): Payer: Medicare Other | Admitting: Anesthesiology

## 2013-02-03 ENCOUNTER — Inpatient Hospital Stay (HOSPITAL_COMMUNITY): Payer: Medicare Other | Admitting: Anesthesiology

## 2013-02-03 DIAGNOSIS — I498 Other specified cardiac arrhythmias: Secondary | ICD-10-CM | POA: Diagnosis present

## 2013-02-03 DIAGNOSIS — Z01818 Encounter for other preprocedural examination: Secondary | ICD-10-CM

## 2013-02-03 DIAGNOSIS — K219 Gastro-esophageal reflux disease without esophagitis: Secondary | ICD-10-CM | POA: Diagnosis present

## 2013-02-03 DIAGNOSIS — M62838 Other muscle spasm: Secondary | ICD-10-CM | POA: Diagnosis present

## 2013-02-03 DIAGNOSIS — E1142 Type 2 diabetes mellitus with diabetic polyneuropathy: Secondary | ICD-10-CM | POA: Diagnosis present

## 2013-02-03 DIAGNOSIS — I10 Essential (primary) hypertension: Secondary | ICD-10-CM | POA: Diagnosis present

## 2013-02-03 DIAGNOSIS — M1711 Unilateral primary osteoarthritis, right knee: Secondary | ICD-10-CM | POA: Diagnosis present

## 2013-02-03 DIAGNOSIS — Z8249 Family history of ischemic heart disease and other diseases of the circulatory system: Secondary | ICD-10-CM

## 2013-02-03 DIAGNOSIS — Z800 Family history of malignant neoplasm of digestive organs: Secondary | ICD-10-CM

## 2013-02-03 DIAGNOSIS — D62 Acute posthemorrhagic anemia: Secondary | ICD-10-CM | POA: Diagnosis not present

## 2013-02-03 DIAGNOSIS — I739 Peripheral vascular disease, unspecified: Secondary | ICD-10-CM | POA: Diagnosis present

## 2013-02-03 DIAGNOSIS — M19019 Primary osteoarthritis, unspecified shoulder: Secondary | ICD-10-CM | POA: Diagnosis present

## 2013-02-03 DIAGNOSIS — M109 Gout, unspecified: Secondary | ICD-10-CM | POA: Diagnosis present

## 2013-02-03 DIAGNOSIS — M5137 Other intervertebral disc degeneration, lumbosacral region: Secondary | ICD-10-CM | POA: Diagnosis present

## 2013-02-03 DIAGNOSIS — F17200 Nicotine dependence, unspecified, uncomplicated: Secondary | ICD-10-CM | POA: Diagnosis present

## 2013-02-03 DIAGNOSIS — Z01812 Encounter for preprocedural laboratory examination: Secondary | ICD-10-CM

## 2013-02-03 DIAGNOSIS — E7800 Pure hypercholesterolemia, unspecified: Secondary | ICD-10-CM | POA: Diagnosis present

## 2013-02-03 DIAGNOSIS — E119 Type 2 diabetes mellitus without complications: Secondary | ICD-10-CM | POA: Diagnosis present

## 2013-02-03 DIAGNOSIS — Z981 Arthrodesis status: Secondary | ICD-10-CM

## 2013-02-03 DIAGNOSIS — Z8711 Personal history of peptic ulcer disease: Secondary | ICD-10-CM

## 2013-02-03 DIAGNOSIS — M1710 Unilateral primary osteoarthritis, unspecified knee: Principal | ICD-10-CM | POA: Diagnosis present

## 2013-02-03 DIAGNOSIS — M51379 Other intervertebral disc degeneration, lumbosacral region without mention of lumbar back pain or lower extremity pain: Secondary | ICD-10-CM | POA: Diagnosis present

## 2013-02-03 HISTORY — PX: TOTAL KNEE ARTHROPLASTY: SHX125

## 2013-02-03 LAB — GLUCOSE, CAPILLARY
GLUCOSE-CAPILLARY: 121 mg/dL — AB (ref 70–99)
GLUCOSE-CAPILLARY: 123 mg/dL — AB (ref 70–99)
Glucose-Capillary: 148 mg/dL — ABNORMAL HIGH (ref 70–99)
Glucose-Capillary: 186 mg/dL — ABNORMAL HIGH (ref 70–99)

## 2013-02-03 LAB — HEMOGLOBIN A1C
Hgb A1c MFr Bld: 6.7 % — ABNORMAL HIGH (ref <5.7)
Mean Plasma Glucose: 146 mg/dL — ABNORMAL HIGH (ref <117)

## 2013-02-03 SURGERY — ARTHROPLASTY, KNEE, TOTAL
Anesthesia: General | Site: Knee | Laterality: Right

## 2013-02-03 MED ORDER — MIDAZOLAM HCL 2 MG/2ML IJ SOLN
INTRAMUSCULAR | Status: AC
  Administered 2013-02-03: 12:00:00 2 mg via INTRAVENOUS
  Filled 2013-02-03: qty 2

## 2013-02-03 MED ORDER — ONDANSETRON HCL 4 MG/2ML IJ SOLN: 4.0000 mg | Freq: Once | INTRAMUSCULAR | Status: DC | PRN

## 2013-02-03 MED ORDER — METOCLOPRAMIDE HCL 5 MG/ML IJ SOLN: 5.0000 mg | Freq: Three times a day (TID) | INTRAMUSCULAR | Status: DC | PRN

## 2013-02-03 MED ORDER — LIDOCAINE HCL (CARDIAC) 20 MG/ML IV SOLN
INTRAVENOUS | Status: DC | PRN
  Administered 2013-02-03: 40 mg via INTRAVENOUS

## 2013-02-03 MED ORDER — HYDROMORPHONE HCL PF 1 MG/ML IJ SOLN
INTRAMUSCULAR | Status: AC
  Administered 2013-02-03: 0.5 mg via INTRAVENOUS
  Filled 2013-02-03: qty 1

## 2013-02-03 MED ORDER — CEFUROXIME SODIUM 1.5 G IJ SOLR
INTRAMUSCULAR | Status: AC
  Filled 2013-02-03: qty 1.5

## 2013-02-03 MED ORDER — CHLORHEXIDINE GLUCONATE 4 % EX LIQD: 60.0000 mL | Freq: Once | CUTANEOUS | Status: DC

## 2013-02-03 MED ORDER — MEPERIDINE HCL 25 MG/ML IJ SOLN: 6.2500 mg | INTRAMUSCULAR | Status: DC | PRN

## 2013-02-03 MED ORDER — CEFAZOLIN SODIUM-DEXTROSE 2-3 GM-% IV SOLR
INTRAVENOUS | Status: AC
  Filled 2013-02-03: qty 50

## 2013-02-03 MED ORDER — DIPHENHYDRAMINE HCL 12.5 MG/5ML PO ELIX: 12.5000 mg | ORAL_SOLUTION | ORAL | Status: DC | PRN

## 2013-02-03 MED ORDER — PHENOL 1.4 % MT LIQD: 1.0000 | OROMUCOSAL | Status: DC | PRN

## 2013-02-03 MED ORDER — KCL IN DEXTROSE-NACL 20-5-0.45 MEQ/L-%-% IV SOLN
INTRAVENOUS | Status: DC
  Administered 2013-02-03 – 2013-02-04 (×4): via INTRAVENOUS
  Administered 2013-02-05: 1000 mL via INTRAVENOUS
  Administered 2013-02-06 – 2013-02-07 (×4): via INTRAVENOUS
  Administered 2013-02-07 (×2): 1000 mL via INTRAVENOUS
  Filled 2013-02-03 (×18): qty 1000

## 2013-02-03 MED ORDER — GLIMEPIRIDE 4 MG PO TABS
4.0000 mg | ORAL_TABLET | Freq: Two times a day (BID) | ORAL | Status: DC
  Administered 2013-02-03 – 2013-02-05 (×5): 4 mg via ORAL
  Filled 2013-02-03 (×7): qty 1

## 2013-02-03 MED ORDER — OXYCODONE HCL 5 MG PO TABS
5.0000 mg | ORAL_TABLET | Freq: Once | ORAL | Status: AC | PRN
  Administered 2013-02-03: 5 mg via ORAL

## 2013-02-03 MED ORDER — MIDAZOLAM HCL 2 MG/2ML IJ SOLN
1.0000 mg | INTRAMUSCULAR | Status: DC | PRN
  Administered 2013-02-03: 2 mg via INTRAVENOUS

## 2013-02-03 MED ORDER — FENTANYL CITRATE 0.05 MG/ML IJ SOLN
50.0000 ug | INTRAMUSCULAR | Status: DC | PRN
  Administered 2013-02-03: 100 ug via INTRAVENOUS

## 2013-02-03 MED ORDER — OMEPRAZOLE MAGNESIUM 20 MG PO TBEC: 40.0000 mg | DELAYED_RELEASE_TABLET | Freq: Every day | ORAL | Status: DC

## 2013-02-03 MED ORDER — MAGNESIUM CITRATE PO SOLN: 1.0000 | Freq: Once | ORAL | Status: AC | PRN

## 2013-02-03 MED ORDER — ACETAMINOPHEN 325 MG PO TABS
650.0000 mg | ORAL_TABLET | Freq: Four times a day (QID) | ORAL | Status: DC | PRN
  Administered 2013-02-04: 650 mg via ORAL
  Administered 2013-02-05: 325 mg via ORAL
  Administered 2013-02-06 (×2): 650 mg via ORAL
  Filled 2013-02-03 (×4): qty 2

## 2013-02-03 MED ORDER — FENTANYL CITRATE 0.05 MG/ML IJ SOLN
INTRAMUSCULAR | Status: AC
  Administered 2013-02-03: 100 ug via INTRAVENOUS
  Filled 2013-02-03: qty 2

## 2013-02-03 MED ORDER — GABAPENTIN 600 MG PO TABS
1200.0000 mg | ORAL_TABLET | Freq: Three times a day (TID) | ORAL | Status: DC
  Administered 2013-02-03 – 2013-02-08 (×14): 1200 mg via ORAL
  Filled 2013-02-03 (×17): qty 2

## 2013-02-03 MED ORDER — OXYCODONE HCL 5 MG PO TABS
5.0000 mg | ORAL_TABLET | ORAL | Status: DC | PRN
  Administered 2013-02-03 – 2013-02-08 (×17): 10 mg via ORAL
  Filled 2013-02-03 (×17): qty 2

## 2013-02-03 MED ORDER — LACTATED RINGERS IV SOLN
INTRAVENOUS | Status: DC
  Administered 2013-02-03 (×2): via INTRAVENOUS

## 2013-02-03 MED ORDER — METHOCARBAMOL 500 MG PO TABS
500.0000 mg | ORAL_TABLET | Freq: Four times a day (QID) | ORAL | Status: DC | PRN
  Administered 2013-02-03 – 2013-02-08 (×14): 500 mg via ORAL
  Filled 2013-02-03 (×13): qty 1

## 2013-02-03 MED ORDER — HYDROMORPHONE HCL PF 1 MG/ML IJ SOLN
0.2500 mg | INTRAMUSCULAR | Status: DC | PRN
  Administered 2013-02-03 (×4): 0.5 mg via INTRAVENOUS

## 2013-02-03 MED ORDER — SENNOSIDES-DOCUSATE SODIUM 8.6-50 MG PO TABS: 1.0000 | ORAL_TABLET | Freq: Every evening | ORAL | Status: DC | PRN

## 2013-02-03 MED ORDER — METOCLOPRAMIDE HCL 10 MG PO TABS: 5.0000 mg | ORAL_TABLET | Freq: Three times a day (TID) | ORAL | Status: DC | PRN

## 2013-02-03 MED ORDER — METHOCARBAMOL 100 MG/ML IJ SOLN
500.0000 mg | Freq: Four times a day (QID) | INTRAVENOUS | Status: DC | PRN
  Filled 2013-02-03: qty 5

## 2013-02-03 MED ORDER — FENTANYL CITRATE 0.05 MG/ML IJ SOLN
INTRAMUSCULAR | Status: DC | PRN
  Administered 2013-02-03: 100 ug via INTRAVENOUS
  Administered 2013-02-03 (×3): 50 ug via INTRAVENOUS

## 2013-02-03 MED ORDER — METHOCARBAMOL 500 MG PO TABS
ORAL_TABLET | ORAL | Status: AC
  Filled 2013-02-03: qty 1

## 2013-02-03 MED ORDER — ALUM & MAG HYDROXIDE-SIMETH 200-200-20 MG/5ML PO SUSP: 30.0000 mL | ORAL | Status: DC | PRN

## 2013-02-03 MED ORDER — SIMVASTATIN 20 MG PO TABS
20.0000 mg | ORAL_TABLET | Freq: Every day | ORAL | Status: DC
  Administered 2013-02-03 – 2013-02-07 (×5): 20 mg via ORAL
  Filled 2013-02-03 (×6): qty 1

## 2013-02-03 MED ORDER — OXYCODONE HCL 5 MG PO TABS
ORAL_TABLET | ORAL | Status: AC
  Filled 2013-02-03: qty 1

## 2013-02-03 MED ORDER — BUPIVACAINE-EPINEPHRINE PF 0.5-1:200000 % IJ SOLN
INTRAMUSCULAR | Status: DC | PRN
  Administered 2013-02-03: 30 mL via PERINEURAL

## 2013-02-03 MED ORDER — ONDANSETRON HCL 4 MG/2ML IJ SOLN
4.0000 mg | Freq: Four times a day (QID) | INTRAMUSCULAR | Status: DC | PRN
  Administered 2013-02-04: 4 mg via INTRAVENOUS
  Filled 2013-02-03: qty 2

## 2013-02-03 MED ORDER — SODIUM CHLORIDE 0.9 % IJ SOLN
INTRAMUSCULAR | Status: DC | PRN
  Administered 2013-02-03: 14:00:00

## 2013-02-03 MED ORDER — LISINOPRIL 20 MG PO TABS
20.0000 mg | ORAL_TABLET | Freq: Every day | ORAL | Status: DC
  Administered 2013-02-03 – 2013-02-08 (×6): 20 mg via ORAL
  Filled 2013-02-03 (×6): qty 1

## 2013-02-03 MED ORDER — ACETAMINOPHEN 650 MG RE SUPP: 650.0000 mg | Freq: Four times a day (QID) | RECTAL | Status: DC | PRN

## 2013-02-03 MED ORDER — HYDROMORPHONE HCL PF 1 MG/ML IJ SOLN
0.5000 mg | INTRAMUSCULAR | Status: AC | PRN
  Administered 2013-02-03 (×2): 0.5 mg via INTRAVENOUS

## 2013-02-03 MED ORDER — ASPIRIN EC 325 MG PO TBEC
325.0000 mg | DELAYED_RELEASE_TABLET | Freq: Every day | ORAL | Status: DC
  Administered 2013-02-04 – 2013-02-08 (×5): 325 mg via ORAL
  Filled 2013-02-03 (×6): qty 1

## 2013-02-03 MED ORDER — ALLOPURINOL 300 MG PO TABS
300.0000 mg | ORAL_TABLET | Freq: Every day | ORAL | Status: DC
  Administered 2013-02-03 – 2013-02-08 (×6): 300 mg via ORAL
  Filled 2013-02-03 (×6): qty 1

## 2013-02-03 MED ORDER — OXYCODONE HCL 5 MG/5ML PO SOLN: 5.0000 mg | Freq: Once | ORAL | Status: AC | PRN

## 2013-02-03 MED ORDER — INSULIN ASPART 100 UNIT/ML ~~LOC~~ SOLN
0.0000 [IU] | Freq: Three times a day (TID) | SUBCUTANEOUS | Status: DC
  Administered 2013-02-04 (×2): 3 [IU] via SUBCUTANEOUS
  Administered 2013-02-04: 5 [IU] via SUBCUTANEOUS
  Administered 2013-02-05 – 2013-02-07 (×2): 3 [IU] via SUBCUTANEOUS
  Administered 2013-02-07: 5 [IU] via SUBCUTANEOUS
  Administered 2013-02-08 (×2): 2 [IU] via SUBCUTANEOUS

## 2013-02-03 MED ORDER — PROPOFOL 10 MG/ML IV BOLUS
INTRAVENOUS | Status: DC | PRN
  Administered 2013-02-03: 150 mg via INTRAVENOUS

## 2013-02-03 MED ORDER — HYDROMORPHONE HCL PF 1 MG/ML IJ SOLN
INTRAMUSCULAR | Status: AC
  Filled 2013-02-03: qty 1

## 2013-02-03 MED ORDER — PANTOPRAZOLE SODIUM 40 MG PO TBEC
80.0000 mg | DELAYED_RELEASE_TABLET | Freq: Every day | ORAL | Status: DC
  Administered 2013-02-03 – 2013-02-08 (×6): 80 mg via ORAL
  Filled 2013-02-03 (×6): qty 2

## 2013-02-03 MED ORDER — ONDANSETRON HCL 4 MG PO TABS: 4.0000 mg | ORAL_TABLET | Freq: Four times a day (QID) | ORAL | Status: DC | PRN

## 2013-02-03 MED ORDER — MENTHOL 3 MG MT LOZG: 1.0000 | LOZENGE | OROMUCOSAL | Status: DC | PRN

## 2013-02-03 MED ORDER — EPHEDRINE SULFATE 50 MG/ML IJ SOLN
INTRAMUSCULAR | Status: DC | PRN
  Administered 2013-02-03: 5 mg via INTRAVENOUS

## 2013-02-03 MED ORDER — CEFUROXIME SODIUM 1.5 G IJ SOLR
INTRAMUSCULAR | Status: DC | PRN
  Administered 2013-02-03: 1.5 g

## 2013-02-03 MED ORDER — HYDROMORPHONE HCL PF 1 MG/ML IJ SOLN
1.0000 mg | INTRAMUSCULAR | Status: DC | PRN
  Administered 2013-02-03 – 2013-02-04 (×9): 1 mg via INTRAVENOUS
  Filled 2013-02-03 (×9): qty 1

## 2013-02-03 MED ORDER — DEXTROSE-NACL 5-0.45 % IV SOLN: INTRAVENOUS | Status: DC

## 2013-02-03 MED ORDER — ONDANSETRON HCL 4 MG/2ML IJ SOLN
INTRAMUSCULAR | Status: DC | PRN
  Administered 2013-02-03: 4 mg via INTRAVENOUS

## 2013-02-03 MED ORDER — SODIUM CHLORIDE 0.9 % IR SOLN
Status: DC | PRN
  Administered 2013-02-03: 1000 mL

## 2013-02-03 MED ORDER — CLOPIDOGREL BISULFATE 75 MG PO TABS
75.0000 mg | ORAL_TABLET | Freq: Every day | ORAL | Status: DC
  Administered 2013-02-04 – 2013-02-08 (×5): 75 mg via ORAL
  Filled 2013-02-03 (×6): qty 1

## 2013-02-03 MED ORDER — BUPIVACAINE LIPOSOME 1.3 % IJ SUSP
20.0000 mL | Freq: Once | INTRAMUSCULAR | Status: DC
  Filled 2013-02-03: qty 20

## 2013-02-03 MED ORDER — DOCUSATE SODIUM 100 MG PO CAPS
100.0000 mg | ORAL_CAPSULE | Freq: Two times a day (BID) | ORAL | Status: DC
  Administered 2013-02-03 – 2013-02-08 (×10): 100 mg via ORAL
  Filled 2013-02-03 (×10): qty 1

## 2013-02-03 MED ORDER — BISACODYL 5 MG PO TBEC: 5.0000 mg | DELAYED_RELEASE_TABLET | Freq: Every day | ORAL | Status: DC | PRN

## 2013-02-03 SURGICAL SUPPLY — 58 items
BANDAGE ESMARK 6X9 LF (GAUZE/BANDAGES/DRESSINGS) ×1 IMPLANT
BLADE SAG 18X100X1.27 (BLADE) ×3 IMPLANT
BLADE SAW SGTL 13X75X1.27 (BLADE) ×3 IMPLANT
BLADE SURG ROTATE 9660 (MISCELLANEOUS) IMPLANT
BNDG CMPR 9X6 STRL LF SNTH (GAUZE/BANDAGES/DRESSINGS) ×1
BNDG CMPR MED 10X6 ELC LF (GAUZE/BANDAGES/DRESSINGS) ×1
BNDG ELASTIC 6X10 VLCR STRL LF (GAUZE/BANDAGES/DRESSINGS) ×3 IMPLANT
BNDG ESMARK 6X9 LF (GAUZE/BANDAGES/DRESSINGS) ×3
BOWL SMART MIX CTS (DISPOSABLE) ×3 IMPLANT
CAPT RP KNEE ×2 IMPLANT
CEMENT HV SMART SET (Cement) ×6 IMPLANT
CLOTH BEACON ORANGE TIMEOUT ST (SAFETY) ×1 IMPLANT
COVER SURGICAL LIGHT HANDLE (MISCELLANEOUS) ×3 IMPLANT
CUFF TOURNIQUET SINGLE 34IN LL (TOURNIQUET CUFF) ×2 IMPLANT
CUFF TOURNIQUET SINGLE 44IN (TOURNIQUET CUFF) IMPLANT
DRAPE EXTREMITY T 121X128X90 (DRAPE) ×3 IMPLANT
DRAPE U-SHAPE 47X51 STRL (DRAPES) ×3 IMPLANT
DURAPREP 26ML APPLICATOR (WOUND CARE) ×5 IMPLANT
ELECT REM PT RETURN 9FT ADLT (ELECTROSURGICAL) ×3
ELECTRODE REM PT RTRN 9FT ADLT (ELECTROSURGICAL) ×1 IMPLANT
EVACUATOR 1/8 PVC DRAIN (DRAIN) ×3 IMPLANT
GAUZE XEROFORM 1X8 LF (GAUZE/BANDAGES/DRESSINGS) ×3 IMPLANT
GLOVE BIO SURGEON STRL SZ7.5 (GLOVE) ×3 IMPLANT
GLOVE BIO SURGEON STRL SZ8.5 (GLOVE) ×3 IMPLANT
GLOVE BIOGEL PI IND STRL 8 (GLOVE) ×1 IMPLANT
GLOVE BIOGEL PI IND STRL 9 (GLOVE) ×1 IMPLANT
GLOVE BIOGEL PI INDICATOR 8 (GLOVE) ×2
GLOVE BIOGEL PI INDICATOR 9 (GLOVE) ×2
GOWN PREVENTION PLUS XLARGE (GOWN DISPOSABLE) ×3 IMPLANT
GOWN STRL NON-REIN LRG LVL3 (GOWN DISPOSABLE) ×3 IMPLANT
GOWN STRL REIN XL XLG (GOWN DISPOSABLE) ×3 IMPLANT
HANDPIECE INTERPULSE COAX TIP (DISPOSABLE) ×3
HOOD PEEL AWAY FACE SHEILD DIS (HOOD) ×6 IMPLANT
KIT BASIN OR (CUSTOM PROCEDURE TRAY) ×3 IMPLANT
KIT ROOM TURNOVER OR (KITS) ×3 IMPLANT
MANIFOLD NEPTUNE II (INSTRUMENTS) ×3 IMPLANT
NDL SAFETY ECLIPSE 18X1.5 (NEEDLE) IMPLANT
NDL SPNL 18GX3.5 QUINCKE PK (NEEDLE) IMPLANT
NEEDLE HYPO 18GX1.5 SHARP (NEEDLE)
NEEDLE SPNL 18GX3.5 QUINCKE PK (NEEDLE) ×3 IMPLANT
NS IRRIG 1000ML POUR BTL (IV SOLUTION) ×3 IMPLANT
PACK TOTAL JOINT (CUSTOM PROCEDURE TRAY) ×3 IMPLANT
PAD ARMBOARD 7.5X6 YLW CONV (MISCELLANEOUS) ×6 IMPLANT
PADDING CAST COTTON 6X4 STRL (CAST SUPPLIES) ×3 IMPLANT
SET HNDPC FAN SPRY TIP SCT (DISPOSABLE) ×1 IMPLANT
SPONGE GAUZE 4X4 12PLY (GAUZE/BANDAGES/DRESSINGS) ×4 IMPLANT
STAPLER VISISTAT 35W (STAPLE) ×3 IMPLANT
SUCTION FRAZIER TIP 10 FR DISP (SUCTIONS) ×3 IMPLANT
SUT VIC AB 0 CTX 36 (SUTURE) ×3
SUT VIC AB 0 CTX36XBRD ANTBCTR (SUTURE) ×1 IMPLANT
SUT VIC AB 1 CTX 36 (SUTURE) ×3
SUT VIC AB 1 CTX36XBRD ANBCTR (SUTURE) ×1 IMPLANT
SUT VIC AB 2-0 CT1 27 (SUTURE) ×3
SUT VIC AB 2-0 CT1 TAPERPNT 27 (SUTURE) ×1 IMPLANT
SYR 50ML LL SCALE MARK (SYRINGE) ×3 IMPLANT
TOWEL OR 17X24 6PK STRL BLUE (TOWEL DISPOSABLE) ×3 IMPLANT
TOWEL OR 17X26 10 PK STRL BLUE (TOWEL DISPOSABLE) ×3 IMPLANT
WATER STERILE IRR 1000ML POUR (IV SOLUTION) ×2 IMPLANT

## 2013-02-03 NOTE — Anesthesia Preprocedure Evaluation (Addendum)
Anesthesia Evaluation  Patient identified by MRN, date of birth, ID band Patient awake    Reviewed: Allergy & Precautions, H&P , NPO status , Patient's Chart, lab work & pertinent test results  Airway Mallampati: II TM Distance: >3 FB Neck ROM: Full    Dental  (+) Partial Lower and Teeth Intact   Pulmonary Current Smoker,          Cardiovascular hypertension, Pt. on medications + Peripheral Vascular Disease     Neuro/Psych    GI/Hepatic GERD-  Medicated and Controlled,  Endo/Other  diabetes, Well Controlled, Type 2  Renal/GU      Musculoskeletal   Abdominal   Peds  Hematology   Anesthesia Other Findings   Reproductive/Obstetrics                          Anesthesia Physical Anesthesia Plan  ASA: II  Anesthesia Plan: General   Post-op Pain Management:    Induction: Intravenous  Airway Management Planned: LMA  Additional Equipment:   Intra-op Plan:   Post-operative Plan: Extubation in OR  Informed Consent: I have reviewed the patients History and Physical, chart, labs and discussed the procedure including the risks, benefits and alternatives for the proposed anesthesia with the patient or authorized representative who has indicated his/her understanding and acceptance.     Plan Discussed with: CRNA and Surgeon  Anesthesia Plan Comments:         Anesthesia Quick Evaluation

## 2013-02-03 NOTE — Preoperative (Signed)
Beta Blockers   Reason not to administer Beta Blockers:Not Applicable 

## 2013-02-03 NOTE — Transfer of Care (Signed)
Immediate Anesthesia Transfer of Care Note  Patient: Marc [name]  Procedure(s) Performed: Procedure(s): TOTAL KNEE ARTHROPLASTY (Right)  Patient Location: PACU  Anesthesia Type:General  Level of Consciousness: awake, alert  and oriented  Airway & Oxygen Therapy: Patient Spontanous Breathing and Patient connected to nasal cannula oxygen  Post-op Assessment: Report given to PACU RN, Post -op Vital signs reviewed and stable and Patient moving all extremities X 4  Post vital signs: Reviewed and stable  Complications: No apparent anesthesia complications

## 2013-02-03 NOTE — Discharge Instructions (Signed)
Total Knee Replacement °Care After °Refer to this sheet in the next few weeks. These instructions provide you with information on caring for yourself after your procedure. Your caregiver also may give you specific instructions. Your treatment has been planned according to the most current medical practices, but problems sometimes occur. Call your caregiver if you have any problems or questions after your procedure. °HOME CARE INSTRUCTIONS  °· See a physical therapist as directed by your caregiver. °· Take over-the-counter or prescription medicines for pain, discomfort, or fever only as directed by your caregiver. °· Avoid lifting or driving until you are instructed otherwise. °· If you have been sent home with a continuous passive motion machine, use it as directed by your caregiver. °SEEK MEDICAL CARE IF: °· You have difficulty breathing. °· Your wound is red, swollen, or has become increasingly painful. °· You have pus draining from your wound. °· You have a bad smell coming from your wound. °· You have persistent bleeding from your wound. °· Your wound breaks open after sutures (stitches) or staples have been removed. °SEEK IMMEDIATE MEDICAL CARE IF:  °· You have a fever. °· You have a rash. °· You have pain or swelling in your calf or thigh. °· You have shortness of breath or chest pain. °· Your range of motion in your knee is decreasing rather than increasing. °MAKE SURE YOU:  °· Understand these instructions. °· Will watch your condition. °· Will get help right away if you are not doing well or get worse. °Document Released: 08/03/2004 Document Revised: 07/16/2011 Document Reviewed: 03/05/2011 °[employer]® Patient Information ©2014 [employer]. ° °

## 2013-02-03 NOTE — Progress Notes (Signed)
Orthopedic Tech Progress Note Patient Details:  Marc [name] March 25, 1940 [id number]  CPM Right Knee CPM Right Knee: On Right Knee Flexion (Degrees): 60 Right Knee Extension (Degrees): 0 Additional Comments: put ohf on bed   [name] 02/03/2013, 4:44 PM

## 2013-02-03 NOTE — Op Note (Signed)
PATIENT ID:      Marc [name]  MRN:     [id number] DOB/AGE:    [birth year] / 73 y.o.       OPERATIVE REPORT    DATE OF PROCEDURE:  02/03/2013       PREOPERATIVE DIAGNOSIS:   RIGHT KNEE DEGENERATIVE JOINT DISEASE      Estimated body mass index is 27.73 kg/(m^2) as calculated from the following:   Height as of 02/02/13: 6\' 1"  (1.854 m).   Weight as of 11/27/12: 95.312 kg (210 lb 2 oz).                                                        POSTOPERATIVE DIAGNOSIS:   right knee degenerative joint disease                                                                      PROCEDURE:  Procedure(s): TOTAL KNEE ARTHROPLASTY Using Depuy Sigma RP implants #5R Femur, #6Tibia, 87mm sigma RP bearing, 41 Patella     SURGEON: Rozalyn Osland [name]    ASSISTANT:   [employer]   (Present and scrubbed throughout the case, critical for assistance with exposure, retraction, instrumentation, and closure.)         ANESTHESIA: GET Exparel  DRAINS: foley, 2 medium hemovac in knee   TOURNIQUET TIME: 02VOZ   COMPLICATIONS:  None     SPECIMENS: None   INDICATIONS FOR PROCEDURE: The patient has  RIGHT KNEE DEGENERATIVE JOINT DISEASE, varus deformities, XR shows bone on bone arthritis. Patient has failed all conservative measures including anti-inflammatory medicines, narcotics, attempts at  exercise and weight loss, cortisone injections and viscosupplementation.  Risks and benefits of surgery have been discussed, questions answered.   DESCRIPTION OF PROCEDURE: The patient identified by armband, received  IV antibiotics, in the holding area at [facility]. Patient taken to the operating room, appropriate anesthetic  monitors were attached, and general endotracheal anesthesia induced with  the patient in supine position, Foley catheter was inserted. Tourniquet  applied high to the operative thigh. Lateral post and foot positioner  applied to the table, the lower extremity was then prepped and draped   in usual sterile fashion from the ankle to the tourniquet. Time-out procedure was performed. The limb was wrapped with an Esmarch bandage and the tourniquet inflated to 350 mmHg. We began the operation by making the anterior midline incision starting at handbreadth above the patella going over the patella 1 cm medial to and  4 cm distal to the tibial tubercle. Small bleeders in the skin and the  subcutaneous tissue identified and cauterized. Transverse retinaculum was incised and reflected medially and a medial parapatellar arthrotomy was accomplished. the patella was everted and theprepatellar fat pad resected. The superficial medial collateral  ligament was then elevated from anterior to posterior along the proximal  flare of the tibia and anterior half of the menisci resected. The knee was hyperflexed exposing bone on bone arthritis. Peripheral and notch osteophytes as well as the cruciate ligaments were then resected. We continued to  work our way around posteriorly along the proximal tibia, and externally  rotated the tibia subluxing it out from underneath the femur. A McHale  retractor was placed through the notch and a lateral Hohmann retractor  placed, and we then drilled through the proximal tibia in line with the  axis of the tibia followed by an intramedullary guide rod and 2-degree  posterior slope cutting guide. The tibial cutting guide was pinned into place  allowing resection of 5 mm of bone medially and about 11 mm of bone  laterally because of her varus deformity. Satisfied with the tibial resection, we then  entered the distal femur 2 mm anterior to the PCL origin with the  intramedullary guide rod and applied the distal femoral cutting guide  set at 54mm, with 5 degrees of valgus. This was pinned along the  epicondylar axis. At this point, the distal femoral cut was accomplished without difficulty. We then sized for a #5R femoral component and pinned the guide in 3 degrees of  external rotation.The chamfer cutting guide was pinned into place. The anterior, posterior, and chamfer cuts were accomplished without difficulty followed by  the Sigma RP box cutting guide and the box cut. We also removed posterior osteophytes from the posterior femoral condyles. At this  time, the knee was brought into full extension. We checked our  extension and flexion gaps and found them symmetric at 86mm.  The patella thickness measured at 25 mm. We set the cutting guide at 15 and removed the posterior 10 mm  of the patella, sized for a 41 button and drilled the lollipop. The knee  was then once again hyperflexed exposing the proximal tibia. We sized for a #6 tibial base plate, applied the smokestack and the conical reamer followed by the the Delta fin keel punch. We then hammered into place the Sigma RP trial femoral component, inserted a 10-mm trial bearing, trial patellar button, and took the knee through range of motion from 0-130 degrees. No thumb pressure was required for patellar  tracking. At this point, all trial components were removed, a double batch of DePuy HV cement with 1500 mg of Zinacef was mixed and applied to all bony metallic mating surfaces except for the posterior condyles of the femur itself. In order, we  hammered into place the tibial tray and removed excess cement, the femoral component and removed excess cement, a 10-mm Sigma RP bearing  was inserted, and the knee brought to full extension with compression.  The patellar button was clamped into place, and excess cement  removed. While the cement cured the wound was irrigated out with normal saline solution pulse lavage, and medium Hemovac drains were placed from an anterolateral  approach. Ligament stability and patellar tracking were checked and found to be excellent. The parapatellar arthrotomy was closed with  running #1 Vicryl suture. The subcutaneous tissue with 0 and 2-0 undyed  Vicryl suture, and the skin with  skin staples. A dressing of Xeroform,  4 x 4, dressing sponges, Webril, and Ace wrap applied. The patient  awakened, extubated, and taken to recovery room without difficulty.   Marc [name] 02/03/2013, 2:49 PM

## 2013-02-03 NOTE — Anesthesia Procedure Notes (Signed)
Anesthesia Regional Block:  Adductor canal block  Pre-Anesthetic Checklist: ,, timeout performed, Correct Patient, Correct Site, Correct Laterality, Correct Procedure, Correct Position, site marked, Risks and benefits discussed,  Surgical consent,  Pre-op evaluation,  At surgeon's request and post-op pain management  Laterality: Right  Prep: chloraprep       Needles:  Injection technique: Single-shot  Needle Type: Echogenic Stimulator Needle     Needle Length:cm 9 cm Needle Gauge: 21 and 21 G    Additional Needles:  Procedures: ultrasound guided (picture in chart) Adductor canal block Narrative:  Start time: 02/03/2013 12:10 PM End time: 02/03/2013 12:20 PM Injection made incrementally with aspirations every 5 mL.  Performed by: Personally  Anesthesiologist: Lillia Abed MD  Additional Notes: Monitors applied. Patient sedated. Sterile prep and drape,hand hygiene and sterile gloves were used. Relevant anatomy identified.Needle position confirmed.Local anesthetic injected incrementally after negative aspiration. Local anesthetic spread visualized around nerve(s). Vascular puncture avoided. No complications. Image printed for medical record.The patient tolerated the procedure well.    Lillia Abed MD

## 2013-02-03 NOTE — Anesthesia Postprocedure Evaluation (Signed)
Anesthesia Post Note  Patient: Marc [name]  Procedure(s) Performed: Procedure(s) (LRB): TOTAL KNEE ARTHROPLASTY (Right)  Anesthesia type: general  Patient location: PACU  Post pain: Pain level controlled  Post assessment: Patient's Cardiovascular Status Stable  Last Vitals:  Filed Vitals:   02/03/13 1700  BP:   Pulse: 66  Temp:   Resp: 9    Post vital signs: Reviewed and stable  Level of consciousness: sedated  Complications: No apparent anesthesia complications

## 2013-02-03 NOTE — Interval H&P Note (Signed)
History and Physical Interval Note:  02/03/2013 1:07 PM  Marc [name]  has presented today for surgery, with the diagnosis of RIGHT KNEE DEGENERATIVE JOINT DISEASE  The various methods of treatment have been discussed with the patient and family. After consideration of risks, benefits and other options for treatment, the patient has consented to  Procedure(s): TOTAL KNEE ARTHROPLASTY (Right) as a surgical intervention .  The patient's history has been reviewed, patient examined, no change in status, stable for surgery.  I have reviewed the patient's chart and labs.  Questions were answered to the patient's satisfaction.     [name]

## 2013-02-03 NOTE — Progress Notes (Signed)
02/03/13 1155  OBSTRUCTIVE SLEEP APNEA  Do you snore loudly (loud enough to be heard through closed doors)?  1  Do you often feel tired, fatigued, or sleepy during the daytime? 1  Has anyone observed you stop breathing during your sleep? 1  Do you have, or are you being treated for high blood pressure? 1  BMI more than 35 kg/m2? 0  Age over 74 years old? 1  Neck circumference greater than 40 cm/18 inches? 0  Gender: 1  Obstructive Sleep Apnea Score 6  Score 4 or greater  Results sent to PCP

## 2013-02-04 LAB — CBC
HCT: 33.1 % — ABNORMAL LOW (ref 39.0–52.0)
Hemoglobin: 10.5 g/dL — ABNORMAL LOW (ref 13.0–17.0)
MCH: 31.4 pg (ref 26.0–34.0)
MCHC: 31.7 g/dL (ref 30.0–36.0)
MCV: 99.1 fL (ref 78.0–100.0)
Platelets: 192 10*3/uL (ref 150–400)
RBC: 3.34 MIL/uL — ABNORMAL LOW (ref 4.22–5.81)
RDW: 14.7 % (ref 11.5–15.5)
WBC: 8.7 10*3/uL (ref 4.0–10.5)

## 2013-02-04 LAB — GLUCOSE, CAPILLARY
GLUCOSE-CAPILLARY: 247 mg/dL — AB (ref 70–99)
Glucose-Capillary: 169 mg/dL — ABNORMAL HIGH (ref 70–99)
Glucose-Capillary: 172 mg/dL — ABNORMAL HIGH (ref 70–99)
Glucose-Capillary: 243 mg/dL — ABNORMAL HIGH (ref 70–99)

## 2013-02-04 NOTE — Evaluation (Signed)
Physical Therapy Evaluation Patient Details Name: BLAYNE FRANKIE MRN: [id number] DOB: Nov 04, 1940 Today's Date: 02/04/2013 Time: 1119-1209 PT Time Calculation (min): 50 min  PT Assessment / Plan / Recommendation History of Present Illness  Patient admitted for right TKA.  h/o TLIF L4-5 12/03/12.  Clinical Impression  Patient presents with decreased independence with mobility due to deficits listed below and will benefit from skilled PT in the acute setting to allow return home independent following SNF rehab stay.  Plans to go to [facility].    PT Assessment  Patient needs continued PT services    Follow Up Recommendations  SNF    Does the patient have the potential to tolerate intense rehabilitation    N/A  Barriers to Discharge Decreased caregiver support      Equipment Recommendations  None recommended by PT    Recommendations for Other Services   None  Frequency 7X/week    Precautions / Restrictions Precautions Precautions: Fall Restrictions RLE Weight Bearing: Weight bearing as tolerated   Pertinent Vitals/Pain C/o severe pain right knee with movement, but drifts to sleep at rest      Mobility  Bed Mobility Overal bed mobility: Needs Assistance;+2 for physical assistance Bed Mobility: Supine to Sit;Sit to Supine Supine to sit: +2 for physical assistance;Mod assist Sit to supine: +2 for physical assistance;Mod assist General bed mobility comments: patient assisting and taking a long time to move due to pain, still needed assist for right LE and to lift shoulders upright; little easier to supine throught left sidelying Transfers Overall transfer level: Needs assistance Equipment used: Rolling walker (2 wheeled) Transfers: Sit to/from Stand Sit to Stand: +2 physical assistance;Max assist General transfer comment: difficulty with transfer due to right LE pain; in standing was unable to take step with left due to right LE pain and buckling; practiced twice and assisted  pt back to bed    Exercises Total Joint Exercises Ankle Circles/Pumps: AROM;Both;10 reps;Supine Quad Sets: AROM;Both;5 reps;Supine Heel Slides: AAROM;Right;5 reps;Supine   PT Diagnosis: Acute pain;Difficulty walking  PT Problem List: Decreased strength;Decreased range of motion;Decreased activity tolerance;Decreased knowledge of use of DME;Decreased balance;Decreased mobility;Decreased knowledge of precautions PT Treatment Interventions: DME instruction;Balance training;Gait training;Functional mobility training;Patient/family education;Therapeutic activities;Therapeutic exercise;Stair training     PT Goals(Current goals can be found in the care plan section) Acute Rehab PT Goals Patient Stated Goal: To go to rehab then home independent PT Goal Formulation: With patient Time For Goal Achievement: 02/18/13 Potential to Achieve Goals: Good  Visit Information  Last PT Received On: 02/04/13 Assistance Needed: +2 History of Present Illness: Patient admitted for right TKA.  h/o TLIF L4-5 12/03/12.       Prior [place] expects to be discharged to:: Skilled nursing facility Living Arrangements: Alone Type of Home: Mobile home Home Access: Stairs to enter Entrance Stairs-Number of Steps: 7 Entrance Stairs-Rails: Left Home Layout: Two level Alternate Level Stairs-Number of Steps: 5 at one level 2 at another level Alternate Level Stairs-Rails: Right Home Equipment: Walker - 2 wheels;Toilet riser;Shower seat Prior Function Level of Independence: Independent with assistive device(s) Communication Communication: No difficulties Dominant Hand: Right    Cognition  Cognition Arousal/Alertness: Lethargic;Suspect due to medications Behavior During Therapy: [facility] for tasks assessed/performed Overall Cognitive Status: Impaired/Different from baseline Area of Impairment: Orientation Orientation Level: Place General Comments: momentary lapses in orientation due  to meds    Extremity/Trunk Assessment Lower Extremity Assessment Lower Extremity Assessment: RLE deficits/detail RLE Deficits / Details: Limited tolerance to mobility  due to pain; extension approx -15 flexion  AAROM 30 RLE: Unable to fully assess due to pain   Balance Balance Overall balance assessment: Needs assistance Sitting-balance support: Feet supported;Bilateral upper extremity supported Sitting balance-Leahy Scale: Fair Dynamic Sitting - Comments: tremors on occasion likely due to meds Standing balance support: Bilateral upper extremity supported Standing balance-Leahy Scale: Poor  End of Session PT - End of Session Equipment Utilized During Treatment: Gait belt Activity Tolerance: Patient limited by pain;Patient limited by fatigue Patient left: in bed;with call bell/phone within reach Nurse Communication: Mobility status  GP     [facility] 02/04/2013, 12:29 PM  Magda [name], [place] 02/04/2013

## 2013-02-04 NOTE — Care Management Note (Signed)
CARE MANAGEMENT NOTE 02/04/2013  Patient:  Marc [name], Marc [name]   Account Number:  [id number]  Date Initiated:  02/04/2013  Documentation initiated by:  Ricki Miller  Subjective/Objective Assessment:   73 yr old male s/p right total knee arthroplasty.     Action/Plan:   Pateint is for shortterm rehab at SNF. Social Worker is aware.   Anticipated DC Date:  02/06/2013   Anticipated DC Plan:  SKILLED NURSING FACILITY  In-house referral  Clinical Social Worker      DC Planning Services  CM consult      Choice offered to / List presented to:             Status of service:  Completed, signed off Medicare Important Message given?   (If response is "NO", the following Medicare IM given date fields will be blank) Date Medicare IM given:   Date Additional Medicare IM given:    Discharge Disposition:  [facility]

## 2013-02-04 NOTE — Progress Notes (Signed)
Utilization review completed.  

## 2013-02-04 NOTE — Progress Notes (Signed)
Patient ID: Marc [name], male   DOB: 11-10-1940, 73 y.o.   MRN: [id number] PATIENT ID: Marc [name]  MRN: [id number]  DOB/AGE:  1940/06/19 / 73 y.o.  1 Day Post-Op Procedure(s) (LRB): TOTAL KNEE ARTHROPLASTY (Right)    PROGRESS NOTE Subjective: Patient is alert, oriented, no Nausea, no Vomiting, yes passing gas, no Bowel Movement. Taking PO moderate. Denies SOB, Chest or Calf Pain. Using Incentive Spirometer, PAS in place. Ambulate WBAT today, CPM 0-60 Patient reports pain as 3 on 0-10 scale  .    Objective: Vital signs in last 24 hours: Filed Vitals:   02/04/13 0000 02/04/13 0208 02/04/13 0400 02/04/13 0613  BP:  137/67  130/61  Pulse:  70  67  Temp:  98.7 F (37.1 C)  99.7 F (37.6 C)  TempSrc:  Oral  Oral  Resp: 18 18 17 16   Height:      Weight:      SpO2:  98% 98% 95%      Intake/Output from previous day: I/O last 3 completed shifts: In: 3376.7 [P.O.:360; I.V.:2691.7; Other:325] Out: 1325 [Urine:950; Drains:325; Blood:50]   Intake/Output this shift:     LABORATORY DATA:  Recent Labs  02/02/13 1002  02/03/13 1746 02/03/13 2135 02/04/13 0515 02/04/13 0618  WBC 6.5  --   --   --  8.7  --   HGB 12.1*  --   --   --  10.5*  --   HCT 37.3*  --   --   --  33.1*  --   PLT 191  --   --   --  192  --   NA 141  --   --   --   --   --   K 4.6  --   --   --   --   --   CL 104  --   --   --   --   --   CO2 25  --   --   --   --   --   BUN 22  --   --   --   --   --   CREATININE 1.40*  --   --   --   --   --   GLUCOSE 104*  --   --   --   --   --   GLUCAP  --   < > 123* 186*  --  169*  INR 0.99  --   --   --   --   --   CALCIUM 9.5  --   --   --   --   --   < > = values in this interval not displayed.  Examination: Neurologically intact ABD soft Neurovascular intact Sensation intact distally Intact pulses distally Dorsiflexion/Plantar flexion intact Incision: no drainage and scant drainage No cellulitis present Compartment soft}  Assessment:   1 Day  Post-Op Procedure(s) (LRB): TOTAL KNEE ARTHROPLASTY (Right) ADDITIONAL DIAGNOSIS:  Diabetes, Hypertension and PVD. s/p angioplasty  Plan: PT/OT WBAT, CPM 5/hrs day until ROM 0-90 degrees, then D/C CPM DVT Prophylaxis:  SCDx72hrs, ASA 325 mg BID x 2 weeks DISCHARGE PLAN: Skilled Nursing Facility/Rehab, [facility] DISCHARGE NEEDS: HHPT, HHRN, CPM, Walker and 3-in-1 comode seat     Kaelin Holford J 02/04/2013, 8:29 AM

## 2013-02-04 NOTE — Progress Notes (Signed)
PT Cancellation Note  Patient Details Name: Marc [name] MRN: [id number] DOB: 06/01/1940   Cancelled Treatment:    Reason Eval/Treat Not Completed: Pain limiting ability to participate; per RN patient continues in pain and currently in CPM.  Will attempt to see tomorrow.   Ho Parisi,CYNDI 02/04/2013, 3:19 PM

## 2013-02-04 NOTE — Clinical Social Work Placement (Addendum)
Clinical Social Work Department  CLINICAL SOCIAL WORK PLACEMENT NOTE  Patient: WAYDEN SCHWERTNER Account Number: [id number] Admit date: 02/04/12  Clinical Social Worker: Rhea Pink LCSWA Date/time: 02/04/2013 11:30 AM  Clinical Social Work is seeking post-discharge placement for this patient at the following level of care: SKILLED NURSING (*CSW will update this form in Epic as items are completed)  02/04/2013 Patient/family provided with [place] Department of Clinical Social Work's list of facilities offering this level of care within the geographic area requested by the patient (or if unable, by the patient's family).  02/04/2013 Patient/family informed of their freedom to choose among providers that offer the needed level of care, that participate in Medicare, Medicaid or managed care program needed by the patient, have an available bed and are willing to accept the patient.  1/8/2015Patient/family informed of [facility]' ownership interest in [facility], as well as of the fact that they are under no obligation to receive care at this facility.  PASARR submitted to EDS on Pre-existing PASARR number received from EDS on  FL2 transmitted to all facilities in geographic area requested by pt/family on 02/04/2013  FL2 transmitted to all facilities within larger geographic area on  Patient informed that his/her managed care company has contracts with or will negotiate with certain facilities, including the following:  Patient/family informed of bed offers received:  Patient chooses bed at [facility] Physician recommends and patient chooses bed at  Patient to be transferred to on 02/08/2013 Patient to be transferred to facility by [facility] The following physician request were entered in Epic:  Additional Comments:

## 2013-02-04 NOTE — Progress Notes (Signed)
Orthopedic Tech Progress Note Patient Details:  Marc [name] 07-Aug-1940 [id number] Checked to see if patient was ready for CPM machine per 1500 rounds--patient already in machine.  Patient ID: Marc [name], male   DOB: [birth year], 73 y.o.   MRN: [id number]   [name] 02/04/2013, 3:07 PM

## 2013-02-05 LAB — GLUCOSE, CAPILLARY
GLUCOSE-CAPILLARY: 101 mg/dL — AB (ref 70–99)
GLUCOSE-CAPILLARY: 56 mg/dL — AB (ref 70–99)
GLUCOSE-CAPILLARY: 71 mg/dL (ref 70–99)
Glucose-Capillary: 155 mg/dL — ABNORMAL HIGH (ref 70–99)
Glucose-Capillary: 106 mg/dL — ABNORMAL HIGH (ref 70–99)
Glucose-Capillary: 64 mg/dL — ABNORMAL LOW (ref 70–99)
Glucose-Capillary: 66 mg/dL — ABNORMAL LOW (ref 70–99)

## 2013-02-05 LAB — CBC
HEMATOCRIT: 28.9 % — AB (ref 39.0–52.0)
Hemoglobin: 9.6 g/dL — ABNORMAL LOW (ref 13.0–17.0)
MCH: 32.4 pg (ref 26.0–34.0)
MCHC: 33.2 g/dL (ref 30.0–36.0)
MCV: 97.6 fL (ref 78.0–100.0)
Platelets: 153 10*3/uL (ref 150–400)
RBC: 2.96 MIL/uL — ABNORMAL LOW (ref 4.22–5.81)
RDW: 14.8 % (ref 11.5–15.5)
WBC: 9.1 10*3/uL (ref 4.0–10.5)

## 2013-02-05 MED ORDER — HYDROCODONE-ACETAMINOPHEN 7.5-325 MG PO TABS
1.0000 | ORAL_TABLET | ORAL | Status: DC | PRN
  Administered 2013-02-05: 2 via ORAL
  Administered 2013-02-05 – 2013-02-06 (×2): 1 via ORAL
  Filled 2013-02-05: qty 2
  Filled 2013-02-05 (×2): qty 1

## 2013-02-05 MED ORDER — NALOXONE HCL 0.4 MG/ML IJ SOLN: 0.4000 mg | INTRAMUSCULAR | Status: DC | PRN

## 2013-02-05 MED ORDER — NALOXONE HCL 0.4 MG/ML IJ SOLN
INTRAMUSCULAR | Status: AC
  Administered 2013-02-05: 0.4 mg
  Filled 2013-02-05: qty 1

## 2013-02-05 MED ORDER — OXYCODONE-ACETAMINOPHEN 5-325 MG PO TABS
1.0000 | ORAL_TABLET | ORAL | Status: DC | PRN
  Administered 2013-02-06 – 2013-02-08 (×8): 1 via ORAL
  Filled 2013-02-05 (×8): qty 1

## 2013-02-05 NOTE — Progress Notes (Signed)
Physical Therapy Treatment Patient Details Name: Marc [name] MRN: [id number] DOB: [birth year] Today's Date: 02/05/2013 Time: 0254-2706 PT Time Calculation (min): 23 min  PT Assessment / Plan / Recommendation  History of Present Illness Patient admitted for right TKA.  h/o TLIF L4-5 12/03/12.   PT Comments   Pt. With high level of pain during session.  Appeared somewhat sedated though aroused with contact.  He became agitated and at one point, threatened to strike therapist.  RN in to provide pain med.  Progress limited today due to pain level.  Follow Up Recommendations  SNF     Does the patient have the potential to tolerate intense rehabilitation     Barriers to Discharge        Equipment Recommendations  None recommended by PT    Recommendations for Other Services    Frequency 7X/week   Progress towards PT Goals Progress towards PT goals: Not progressing toward goals - comment (due to pain level)  Plan Current plan remains appropriate    Precautions / Restrictions Precautions Precautions: Fall Restrictions Weight Bearing Restrictions: Yes RLE Weight Bearing: Weight bearing as tolerated Other Position/Activity Restrictions: Pt. found in bed with R LE externally rotated and flexed at knee and hip.  At end of attempted session, placed long roll alongside R leg to limit external rotation and placed foot on yellow foam block to encourge kee extension   Pertinent Vitals/Pain See vitals tab     Mobility  Bed Mobility Overal bed mobility: +2 for physical assistance Bed Mobility: Supine to Sit;Sit to Supine Supine to sit: +2 for physical assistance;Total assist Sit to supine: +2 for physical assistance;Total assist General bed mobility comments: Pt. with little to no effort to assist with bed mobility and needed 2 total to partially rise to sit at edge of bed.  Transfers Overall transfer level: Needs assistance General transfer comment: Pt. with limited tolerance to coming  to edge of bed and could never tolerate sitting fully upright due to pain in R LE.  RN in room providing pain med.  Pt. ultimately assisted back to bed.  Requested RN have staff assist pt. into recliner if/when pain under better control. Ambulation/Gait Ambulation/Gait assistance:  (no attempted, lack of pt. tolerance)    Exercises Total Joint Exercises Ankle Circles/Pumps: AAROM;Both;10 reps;Supine Short Arc Quad: PROM;Right;5 reps;Supine;Other (comment) (with increased time and poor tolerance)   PT Diagnosis:    PT Problem List:   PT Treatment Interventions:     PT Goals (current goals can now be found in the care plan section) Acute Rehab PT Goals Patient Stated Goal: To go to rehab then home independent  Visit Information  Last PT Received On: 02/05/13 Assistance Needed: +2 PT/OT/SLP Co-Evaluation/Treatment: Yes Reason for Co-Treatment: For patient/therapist safety;Necessary to address cognition/behavior during functional activity (pt. at times threatening therapist (to hit her due to pain)) PT goals addressed during session: Mobility/safety with mobility;Balance OT goals addressed during session: ADL's and self-care;Strengthening/ROM History of Present Illness: Patient admitted for right TKA.  h/o TLIF L4-5 12/03/12.    Subjective Data  Subjective: Pt. reports pain at 1/10 but then moans heavily and calls out any mobility attempts and at one point threatened to hit therapist due to his pain level with attempts at any mobility or exercise Patient Stated Goal: To go to rehab then home independent   Cognition  Cognition Arousal/Alertness: Lethargic Behavior During Therapy: Agitated Overall Cognitive Status: No family/caregiver present to determine baseline cognitive functioning Area of Impairment: Orientation  Orientation Level: Place General Comments: Pt becoming agitated and yelling with all aspects of bed mobility.    Balance  Balance Overall balance assessment: Needs  assistance Sitting-balance support: Bilateral upper extremity supported;Feet supported Sitting balance-Leahy Scale: Fair (leaning on L side with no attempt or tolerance to sit uprigh)  End of Session PT - End of Session Activity Tolerance: Patient limited by pain;Patient limited by fatigue Patient left: in bed;with call bell/phone within reach;with bed alarm set Nurse Communication: Mobility status CPM Right Knee Additional Comments:  (pt refusing CPM at this time, pt educated)   GP     Marc [name] 02/05/2013, 8:59 AM Gerlean Ren PT Acute Rehab Services [place] [phone]

## 2013-02-05 NOTE — Evaluation (Signed)
Occupational Therapy Evaluation Patient Details Name: Marc [name] MRN: [id number] DOB: 1940/11/10 Today's Date: 02/05/2013 Time: 8588-5027 OT Time Calculation (min): 17 min  OT Assessment / Plan / Recommendation History of present illness Patient admitted for right TKA.  h/o TLIF L4-5 12/03/12.   Clinical Impression   Pt admitted with above.  Presents with significantly decreased independence with ADLs.  Only able to tolerate bed mobility with +2 total assist today.  Pt resistant to all aspects of mobility.  Plans to go to [facility] for further rehab before return home.  All further OT needs can be met in the next venue of care.  Recommend SNF for d/c planning. Will sign off.    OT Assessment  All further OT needs can be met in the next venue of care    Follow Up Recommendations  Supervision/Assistance - 24 hour;SNF    Barriers to Discharge      Equipment Recommendations   (TBD next venue)    Recommendations for Other Services    Frequency       Precautions / Restrictions Precautions Precautions: Fall Restrictions Weight Bearing Restrictions: Yes RLE Weight Bearing: Weight bearing as tolerated   Pertinent Vitals/Pain See vitals    ADL  Eating/Feeding: Performed;Set up Where Assessed - Eating/Feeding: Bed level ADL Comments: Pt supine in bed upon PT/OT arrival.  Pt performed bed mobility with +2 total assist and sat EOB 5 minutes. Pt leaning heavily to left side, refusing to distribute any weight to right side.  Attempted to assist pt with obtaining sitting balance in midline, but pt resisting therapist and yelling.  Assist pt with return to supine in bed.    OT Diagnosis:    OT Problem List:   OT Treatment Interventions:     OT Goals(Current goals can be found in the care plan section) Acute Rehab OT Goals Patient Stated Goal: To go to rehab then home independent  Visit Information  Last OT Received On: 02/05/13 Assistance Needed: +2 PT/OT/SLP  Co-Evaluation/Treatment: Yes Reason for Co-Treatment: For patient/therapist safety OT goals addressed during session: ADL's and self-care;Strengthening/ROM History of Present Illness: Patient admitted for right TKA.  h/o TLIF L4-5 12/03/12.       Prior [place] expects to be discharged to:: Skilled nursing facility Prior Function Level of Independence: Independent with assistive device(s) Dominant Hand: Right         Vision/Perception     Cognition  Cognition Arousal/Alertness: Lethargic Behavior During Therapy: Agitated Overall Cognitive Status: No family/caregiver present to determine baseline cognitive functioning General Comments: Pt becoming agitated and yelling with all aspects of bed mobility.    Extremity/Trunk Assessment Upper Extremity Assessment Upper Extremity Assessment: Difficult to assess due to impaired cognition     Mobility Bed Mobility Overal bed mobility: Needs Assistance;+2 for physical assistance Bed Mobility: Supine to Sit;Sit to Supine Supine to sit: +2 for physical assistance;Total assist Sit to supine: +2 for physical assistance;Total assist General bed mobility comments: Use of draw pad to pivot hips around to EOB.  Assist to support RLE and also to support trunk.     Exercise     Balance Balance Overall balance assessment: Needs assistance Sitting-balance support: Feet supported;Bilateral upper extremity supported Sitting balance-Leahy Scale: Fair (leaning entirely on left side, refusing to distribute weight evenly through hips)   End of Session CPM Right Knee Additional Comments:  (pt refusing CPM at this time, pt educated)  GO    02/05/2013 Hermine Messick  [facility] OTR/L Pager [phone] Office [phone]  Darrol Jump 02/05/2013, 8:48 AM

## 2013-02-05 NOTE — Progress Notes (Signed)
Hypoglycemic Event  CBG:64  Treatment: 15 GM carbohydrate snack  Symptoms: None  Follow-up CBG: Time:2216 CBG Result:106  Possible Reasons for Event: Inadequate meal intake    Comments/MD notified:  Pt ate HS snacks. Will continue to monitor.   Corena Pilgrim C  Remember to initiate Hypoglycemia Order Set & complete

## 2013-02-05 NOTE — Progress Notes (Signed)
Administering 1000 medications at 1015. Pt was responsive only to hard sternal rub. Attempted multiple times to arouse pt. 10mg  Oxy IR last given at 0830. O2 sats 97% HR 93. Called Rapid Response. Order received to give 0.4mg  Narcan at 1025. Pt then became alert and oriented. Notified Joanell Rising, [place]. Received orders for Vicodin and Percocet as alternative pain management. Will continue to monitor.

## 2013-02-05 NOTE — Progress Notes (Signed)
Hypoglycemic Event  CBG: 56  Treatment: 15 GM carbohydrate snack; dinner   Symptoms: Shaky  Follow-up CBG: Time: 1640 CBG Result: 71  Possible Reasons for Event: Inadequate meal intake  Comments/MD notified:   Patient eating dinner. Will continue to monitor.    Charlesetta Garibaldi  Remember to initiate Hypoglycemia Order Set & complete

## 2013-02-05 NOTE — Progress Notes (Signed)
Orthopedic Tech Progress Note Patient Details:  Marc [name] April 13, 1940 [id number]  Patient ID: Marc [name], male   DOB: 09/17/1940, 73 y.o.   MRN: [id number] Placed pt's rle in cpm  0-45 degrees ; will increase as pt tolerates; rn notified   Hildred Priest 02/05/2013, 3:14 PM

## 2013-02-05 NOTE — Clinical Social Work Psychosocial (Signed)
Clinical Social Work Department  BRIEF PSYCHOSOCIAL ASSESSMENT  Patient:Marc [name]  Account Number: [id number]   Admit date: 02/03/13 Clinical Social Worker Rhea Pink, MSW Date/Time: 02/04/13 Referred by: Physician Date Referred: 02/04/12 Referred for   SNF Placement   Other Referral:  Interview type: Patient  Other interview type: PSYCHOSOCIAL DATA  Living Status:Husband Admitted from facility:  Level of care:  Primary support name: Marc [name] Primary support relationship to patient: Husband Degree of support available:  Strong and vested  CURRENT CONCERNS  Current Concerns   Post-Acute Placement   Other Concerns:  SOCIAL WORK ASSESSMENT / PLAN  CSW met with pt re: PT recommendation for SNF.   Pt lives with her husband  CSW explained placement process and answered questions.   Pt reports [facility] as her preference    CSW completed FL2 and initiated SNF search.     Assessment/plan status: Information/Referral to [employer]  Other assessment/ plan:  Information/referral to community resources:  SNF   PTAR  PATIENT'S/FAMILY'S RESPONSE TO PLAN OF CARE:  Pt  reports she is agreeable to [facility] in order to increase strength and independence with mobility prior to returning home  Pt verbalized understanding of placement process and appreciation for CSW assist.   Rhea Pink, MSW, [place]

## 2013-02-05 NOTE — Progress Notes (Signed)
Orthopedic Tech Progress Note Patient Details:  CRESCENCIO JOZWIAK 1940-06-12 [id number]  Patient ID: Delman Kitten, male   DOB: [birth year], 73 y.o.   MRN: [id number] Placed pt's rle in cpm @ 0-45 degrees @ [place], Elka Satterfield 02/05/2013, 8:00 PM

## 2013-02-05 NOTE — Significant Event (Addendum)
Rapid Response Event Note  Overview: Time Called: 1023 Arrival Time: 1025 Event Type: Neurologic  Initial Focused Assessment:  Called by primary Rn for patient minimally responsive to sternal rubs.  Patient had received 10mg  oxy this am around 0830.   Interventions: narcan .4mg  iv given, at which point patient started hollering in pain.  Patient alert and vss.  Recommended Rn to call MD and update    Event Summary:  Rn to call if assistance needed   at      at          [facility], Harlin Rain

## 2013-02-05 NOTE — Progress Notes (Signed)
Inpatient Diabetes Program Recommendations  AACE/ADA: New Consensus Statement on Inpatient Glycemic Control (2013)  Target Ranges:  Prepandial:   less than 140 mg/dL      Peak postprandial:   less than 180 mg/dL (1-2 hours)      Critically ill patients:  140 - 180 mg/dL   Reason for Visit: Results for Marc [name], Marc [name] (MRN [id number]) as of 02/05/2013 12:02  Ref. Range 02/04/2013 06:18 02/04/2013 12:12 02/04/2013 16:21 02/04/2013 21:54 02/05/2013 06:23  Glucose-Capillary Latest Range: 70-99 mg/dL 169 (H) 247 (H) 172 (H) 243 (H) 155 (H)   Consider adding Novolog meal coverage 4 units tid with meals while in the hospital.  Also please change schedule of Amaryl tid with meals.    Thanks, Adah Perl, RN, BC-ADM Inpatient Diabetes Coordinator Pager [phone]

## 2013-02-05 NOTE — Progress Notes (Signed)
PATIENT ID: Marc [name]  MRN: [id number]  DOB/AGE:  1940/08/15 / 73 y.o.  2 Days Post-Op Procedure(s) (LRB): TOTAL KNEE ARTHROPLASTY (Right)    PROGRESS NOTE Subjective: Patient is alert, oriented, no Nausea, no Vomiting, yes passing gas, no Bowel Movement. Taking PO ok with pt taking a few bites. Denies SOB, Chest or Calf Pain. Using Incentive Spirometer, PAS in place. Ambulate WBAT, CPM 0-60 Patient reports pain as moderate and severe  .    Objective: Vital signs in last 24 hours: Filed Vitals:   02/04/13 2027 02/05/13 0000 02/05/13 0400 02/05/13 0421  BP: 137/67   128/84  Pulse: 106   91  Temp: 99.6 F (37.6 C)   99.4 F (37.4 C)  TempSrc: Oral   Oral  Resp: 18 17 17 16   Height:      Weight:      SpO2: 97% 98%  96%      Intake/Output from previous day: I/O last 3 completed shifts: In: 3889.2 [P.O.:960; I.V.:2929.2] Out: 2575 [Urine:2250; Drains:325]   Intake/Output this shift: Total I/O In: 992.1 [P.O.:260; I.V.:732.1] Out: -    LABORATORY DATA:  Recent Labs  02/02/13 1002  02/04/13 0515  02/04/13 1621 02/04/13 2154 02/05/13 0603 02/05/13 0623  WBC 6.5  --  8.7  --   --   --  9.1  --   HGB 12.1*  --  10.5*  --   --   --  9.6*  --   HCT 37.3*  --  33.1*  --   --   --  28.9*  --   PLT 191  --  192  --   --   --  153  --   NA 141  --   --   --   --   --   --   --   K 4.6  --   --   --   --   --   --   --   CL 104  --   --   --   --   --   --   --   CO2 25  --   --   --   --   --   --   --   BUN 22  --   --   --   --   --   --   --   CREATININE 1.40*  --   --   --   --   --   --   --   GLUCOSE 104*  --   --   --   --   --   --   --   GLUCAP  --   < >  --   < > 172* 243*  --  155*  INR 0.99  --   --   --   --   --   --   --   CALCIUM 9.5  --   --   --   --   --   --   --   < > = values in this interval not displayed.  Examination: Neurologically intact Neurovascular intact Sensation intact distally Intact pulses distally Dorsiflexion/Plantar  flexion intact Incision: scant drainage No cellulitis present Compartment soft}  Assessment:   2 Days Post-Op Procedure(s) (LRB): TOTAL KNEE ARTHROPLASTY (Right) ADDITIONAL DIAGNOSIS:  Diabetes, Hypertension and PVD. s/p angioplasty  Plan: PT/OT WBAT, CPM 5/hrs day until ROM 0-90 degrees, then D/C CPM DVT  Prophylaxis:  SCDx72hrs, ASA 325 mg BID x 2 weeks DISCHARGE PLAN: Skilled Nursing Facility/Rehab, [facility] when pt passes PT goals. DISCHARGE NEEDS: HHPT, HHRN, CPM, Walker and 3-in-1 comode seat     Kae Lauman R 02/05/2013, 9:46 AM

## 2013-02-06 ENCOUNTER — Inpatient Hospital Stay (HOSPITAL_COMMUNITY): Payer: Medicare Other

## 2013-02-06 DIAGNOSIS — E1142 Type 2 diabetes mellitus with diabetic polyneuropathy: Secondary | ICD-10-CM | POA: Diagnosis present

## 2013-02-06 LAB — CBC
HEMATOCRIT: 24.3 % — AB (ref 39.0–52.0)
Hemoglobin: 8 g/dL — ABNORMAL LOW (ref 13.0–17.0)
MCH: 32 pg (ref 26.0–34.0)
MCHC: 32.9 g/dL (ref 30.0–36.0)
MCV: 97.2 fL (ref 78.0–100.0)
PLATELETS: 153 10*3/uL (ref 150–400)
RBC: 2.5 MIL/uL — ABNORMAL LOW (ref 4.22–5.81)
RDW: 14.7 % (ref 11.5–15.5)
WBC: 7.8 10*3/uL (ref 4.0–10.5)

## 2013-02-06 LAB — GLUCOSE, CAPILLARY
Glucose-Capillary: 85 mg/dL (ref 70–99)
Glucose-Capillary: 76 mg/dL (ref 70–99)
Glucose-Capillary: 50 mg/dL — ABNORMAL LOW (ref 70–99)
Glucose-Capillary: 121 mg/dL — ABNORMAL HIGH (ref 70–99)
Glucose-Capillary: 187 mg/dL — ABNORMAL HIGH (ref 70–99)

## 2013-02-06 MED ORDER — GLIMEPIRIDE 4 MG PO TABS
4.0000 mg | ORAL_TABLET | Freq: Two times a day (BID) | ORAL | Status: DC
  Administered 2013-02-07 – 2013-02-08 (×3): 4 mg via ORAL
  Filled 2013-02-06 (×4): qty 1

## 2013-02-06 MED ORDER — DEXTROSE 50 % IV SOLN
INTRAVENOUS | Status: AC
  Administered 2013-02-06: 17:00:00
  Filled 2013-02-06: qty 50

## 2013-02-06 MED ORDER — DEXTROSE 50 % IV SOLN
25.0000 mL | Freq: Once | INTRAVENOUS | Status: AC | PRN
  Administered 2013-02-06: 25 mL via INTRAVENOUS

## 2013-02-06 NOTE — Progress Notes (Signed)
Physical Therapy Treatment Patient Details Name: Marc [name] MRN: [id number] DOB: [birth year] Today's Date: 02/06/2013 Time: 9233-0076 PT Time Calculation (min): 12 min  PT Assessment / Plan / Recommendation  History of Present Illness Patient admitted for right TKA.  h/o TLIF L4-5 12/03/12.   PT Comments   Pt making some progress today with less assistance needed with session.   Follow Up Recommendations  SNF     Equipment Recommendations  None recommended by PT    Frequency 7X/week   Progress towards PT Goals Progress towards PT goals: Progressing toward goals  Plan Current plan remains appropriate    Precautions / Restrictions Precautions Precautions: Fall Restrictions RLE Weight Bearing: Weight bearing as tolerated    Mobility  Bed Mobility Overal bed mobility: +2 for physical assistance Bed Mobility: Rolling Rolling: Mod assist Supine to sit: Mod assist Sit to supine: Mod assist General bed mobility comments: mod assist with max cues and pt using sheet to move right leg to left edge of bed to sit up when radiology arrived to take pt to xray. mod assist with cues/sheet to move right leg back toward right side of bed. mod assist with cues/rail to roll right  x1 and left x1 for pad postioning and 2 person max assist to scoot pt up to head of bed.             Exercises Total Joint Exercises Ankle Circles/Pumps: AROM;Both;10 reps;Supine Quad Sets: AROM;Strengthening;Right;10 reps;Supine Heel Slides: AAROM;Strengthening;Right;5 reps;Supine     PT Goals (current goals can now be found in the care plan section) Acute Rehab PT Goals Patient Stated Goal: To go to rehab then home independent PT Goal Formulation: With patient Time For Goal Achievement: 02/18/13 Potential to Achieve Goals: Good  Visit Information  Last PT Received On: 02/06/13 Assistance Needed: +2 History of Present Illness: Patient admitted for right TKA.  h/o TLIF L4-5 12/03/12.    Subjective  Data  Patient Stated Goal: To go to rehab then home independent   Cognition  Cognition Arousal/Alertness: Awake/alert Behavior During Therapy: Agitated Overall Cognitive Status: No family/caregiver present to determine baseline cognitive functioning Area of Impairment: Safety/judgement;Problem solving;Orientation Memory: Decreased recall of precautions General Comments: Pt becoming agitated and yelling with all aspects of bed mobility.    End of Session PT - End of Session Activity Tolerance: Patient limited by pain;Other (comment) (limited due to pt going to radiology) Nurse Communication: Mobility status   GP     Willow Ora 02/06/2013, 1:19 PM Willow Ora, PTA Office- [phone]

## 2013-02-06 NOTE — Progress Notes (Signed)
Per RN patient is not medically stable for D/C today. Clinical Social Worker (CSW) reported to RN that [place] admissions coordinator at [facility] has to have the D/C summary by 11:30 today Saturday 02/06/13 in order for patient to D/C this weekend. Per RN patient is not ready for D/C and will most likely go on Monday 02/08/13. CSW will continue to  follow for D/C.    Blima Rich, [place] Weekend CSW [phone]

## 2013-02-06 NOTE — Progress Notes (Signed)
Patient ID: Marc [name], male   DOB: [birth year], 73 y.o.   MRN: [id number] PATIENT ID: Marc [name]  MRN: [id number]  DOB/AGE:  02-19-1940 / 73 y.o.  3 Days Post-Op Procedure(s) (LRB): TOTAL KNEE ARTHROPLASTY (Right)    PROGRESS NOTE Subjective: Patient is alert, oriented, no Nausea, no Vomiting, yes passing gas, no Bowel Movement. Taking PO well. Denies SOB, Chest or Calf Pain. Using Incentive Spirometer, PAS in place. Ambulate WBAT , CPM 0-60 Patient reports pain as min at rest, 10/10 if he attempts to move knee .    Objective: Vital signs in last 24 hours: Filed Vitals:   02/05/13 1300 02/05/13 1600 02/05/13 2047 02/06/13 0500  BP: 133/76  134/74 125/66  Pulse: 99  96 67  Temp: 99.2 F (37.3 C)  98.4 F (36.9 C) 98.8 F (37.1 C)  TempSrc:   Oral Oral  Resp: 20 18 18 18   Height:      Weight:      SpO2: 99% 99% 99% 99%      Intake/Output from previous day: I/O last 3 completed shifts: In: 3112.1 [P.O.:720; I.V.:2392.1] Out: 3050 [Urine:3050]   Intake/Output this shift:     LABORATORY DATA:  Recent Labs  02/05/13 0603  02/05/13 2200 02/05/13 2216 02/06/13 0516 02/06/13 0632  WBC 9.1  --   --   --  7.8  --   HGB 9.6*  --   --   --  8.0*  --   HCT 28.9*  --   --   --  24.3*  --   PLT 153  --   --   --  153  --   GLUCAP  --   < > 66* 106*  --  85  < > = values in this interval not displayed.  Examination: Neurologically intact ABD soft Neurovascular intact Sensation intact distally Intact pulses distally Dorsiflexion/Plantar flexion intact Incision: no drainage No cellulitis present Compartment soft} Dressing taken down, wound clean and dry, 1+ effusion, ROM 10-60 with pain Assessment:   3 Days Post-Op Procedure(s) (LRB): TOTAL KNEE ARTHROPLASTY (Right) ADDITIONAL DIAGNOSIS:  Acute Blood Loss Anemia, Diabetes, Hypertension and PVD  Plan: PT/OT WBAT, CPM 5/hrs day until ROM 0-90 degrees, then D/C CPM, will get XR of R TKA today DVT Prophylaxis:   SCDx72hrs, ASA 325 mg BID x 2 weeks DISCHARGE PLAN: Skilled Nursing Facility/Rehab, [facility] DISCHARGE NEEDS: HHPT, HHRN, CPM, Walker and 3-in-1 comode seat     Habib Kise J 02/06/2013, 8:07 AM

## 2013-02-07 DIAGNOSIS — K219 Gastro-esophageal reflux disease without esophagitis: Secondary | ICD-10-CM | POA: Insufficient documentation

## 2013-02-07 DIAGNOSIS — K5900 Constipation, unspecified: Secondary | ICD-10-CM | POA: Insufficient documentation

## 2013-02-07 DIAGNOSIS — M109 Gout, unspecified: Secondary | ICD-10-CM | POA: Insufficient documentation

## 2013-02-07 DIAGNOSIS — E785 Hyperlipidemia, unspecified: Secondary | ICD-10-CM | POA: Insufficient documentation

## 2013-02-07 LAB — GLUCOSE, CAPILLARY
GLUCOSE-CAPILLARY: 166 mg/dL — AB (ref 70–99)
Glucose-Capillary: 178 mg/dL — ABNORMAL HIGH (ref 70–99)
Glucose-Capillary: 233 mg/dL — ABNORMAL HIGH (ref 70–99)
Glucose-Capillary: 167 mg/dL — ABNORMAL HIGH (ref 70–99)

## 2013-02-07 NOTE — Progress Notes (Signed)
Orthopedic Tech Progress Note Patient Details:  Marc [name] 03/09/1940 [id number] Checked with patient to see if he wanted to get in CPM, patient refused. Patient ID: Marc [name], male   DOB: 11/19/1940, 73 y.o.   MRN: [id number]   [name] 02/07/2013, 3:09 PM

## 2013-02-07 NOTE — Progress Notes (Signed)
Joanell Rising, PA aware of pt being uncooperative with PT today and becoming violent such as attempting to swing at therapies with fist.  Also PT recommended zero knee foam d/t pt turning foot outward and bending knee.  Orders received and carried out.

## 2013-02-07 NOTE — Progress Notes (Signed)
PATIENT ID: Marc [name]  MRN: [id number]  DOB/AGE:  [birth year] / 73 y.o.  4 Days Post-Op Procedure(s) (LRB): TOTAL KNEE ARTHROPLASTY (Right)    PROGRESS NOTE Subjective: Patient is alert, oriented, no Nausea, no Vomiting, yes passing gas, no Bowel Movement. Taking PO well. Denies SOB, Chest or Calf Pain. Using Incentive Spirometer, PAS in place. Ambulate WBAT, CPM 0-60 Patient reports pain as moderate and severe moderate at rest, severe with activity.    Objective: Vital signs in last 24 hours: Filed Vitals:   02/06/13 1600 02/06/13 2300 02/07/13 0600 02/07/13 0828  BP:  125/57 122/76   Pulse:  70 76   Temp:  101.3 F (38.5 C) 97.6 F (36.4 C)   TempSrc:      Resp: 16 18 18 16   Height:      Weight:      SpO2: 94% 99% 98% 99%      Intake/Output from previous day: I/O last 3 completed shifts: In: 4235.4 [P.O.:1500; I.V.:2735.4] Out: 3650 [Urine:3650]   Intake/Output this shift: Total I/O In: 180 [P.O.:180] Out: -    LABORATORY DATA:  Recent Labs  02/05/13 0603  02/06/13 0516  02/06/13 1732 02/06/13 2229 02/07/13 0715  WBC 9.1  --  7.8  --   --   --   --   HGB 9.6*  --  8.0*  --   --   --   --   HCT 28.9*  --  24.3*  --   --   --   --   PLT 153  --  153  --   --   --   --   GLUCAP  --   < >  --   < > 121* 187* 178*  < > = values in this interval not displayed.  Examination: Neurologically intact Neurovascular intact Sensation intact distally Intact pulses distally Dorsiflexion/Plantar flexion intact Incision: dressing C/D/I No cellulitis present Compartment soft}  X-ray normal.  Assessment:   4 Days Post-Op Procedure(s) (LRB): TOTAL KNEE ARTHROPLASTY (Right) ADDITIONAL DIAGNOSIS:  Acute Blood Loss Anemia, Diabetes, Hypertension and PVD  Plan: PT/OT WBAT, CPM 5/hrs day until ROM 0-90 degrees, then D/C CPM DVT Prophylaxis:  SCDx72hrs, ASA 325 mg BID x 2 weeks DISCHARGE PLAN: Skilled Nursing Facility/Rehab, [facility] tomorrow if pt makes sufficient  gains in therapy DISCHARGE NEEDS: HHPT, HHRN, CPM, Walker and 3-in-1 comode seat     Ruey Storer R 02/07/2013, 9:08 AM

## 2013-02-07 NOTE — Progress Notes (Signed)
Orthopedic Tech Progress Note Patient Details:  Marc [name] May 08, 1940 [id number] Footsie roll delivered.  Patient ID: DELRON COMER, male   DOB: January 30, 1940, 73 y.o.   MRN: [id number]   [name] 02/07/2013, 3:32 PM

## 2013-02-07 NOTE — Progress Notes (Signed)
Physical Therapy Treatment Patient Details Name: Marc [name] MRN: [id number] DOB: April 25, 1940 Today's Date: 02/07/2013 Time: 3546-5681 PT Time Calculation (min): 27 min  PT Assessment / Plan / Recommendation  History of Present Illness Patient admitted for right TKA.  h/o TLIF L4-5 12/03/12.   PT Comments   Pt making limited to no progress with therapy today. Continues to report increased pain levels mostly in knee, however will complain about all over body pain at times as well. Verbally threatening staff today (to hit them) and did attempt to by swinging at therapist with sitting up due to pain with mobility. Pt educated on benefits of movement to decr pain and that immobility can incr pain and that mobility will also prevent skin breakdown as he is getting red on his buttocks. RN room when pt was combative and also came back in room to assess skin before pt was returned to supine.   Follow Up Recommendations  SNF     Equipment Recommendations  None recommended by PT    Frequency 7X/week   Progress towards PT Goals Progress towards PT goals: Not progressing toward goals - comment (limited by pain)  Plan Current plan remains appropriate    Precautions / Restrictions Precautions Precautions: Fall;Knee Precaution Comments: Pt lies in bed with right leg externally rotated and knee flexed. Spoke with RN who is going to check into "foam bootie" used with knee patients to encourage knee ext and proper positioning. Long roll (rolled blanket) used to position lower extremety for now.           Restrictions RLE Weight Bearing: Weight bearing as tolerated    Pertinent Vitals/Pain 10/10 pain with movement, was premedicated and RN gave medicine during session as well.    Mobility  Bed Mobility Overal bed mobility: +2 for physical assistance;+ 2 for safety/equipment Bed Mobility: Sit to Supine;Supine to Sit Supine to sit: +2 for physical assistance;Mod assist;HOB elevated Sit to supine:  +2 for physical assistance;+2 for safety/equipment;Max assist General bed mobility comments: mod assist of 2 people to come into sitting on edge of bed, total assit to move right leg to and off edge of bed with pt attempting to hit staff when at egde of bed and siting up due to pain. Mod assist of two people to lie down supine after sitting awhile with third person holding pt's hands for comfort and staff safety. pt able to assit with lying down by lifting left leg up some and demo'd some trunk control with lying down.                      Exercises Total Joint Exercises Ankle Circles/Pumps: AROM;Both;10 reps;Supine Quad Sets: AROM;Strengthening;Right;10 reps;Supine Heel Slides: AAROM;Strengthening;Right;5 reps;Supine Hip ABduction/ADduction: AAROM;Strengthening;Right;5 reps;Supine      PT Goals (current goals can now be found in the care plan section) Acute Rehab PT Goals Patient Stated Goal: To go to rehab then home independent PT Goal Formulation: With patient Time For Goal Achievement: 02/18/13 Potential to Achieve Goals: Good  Visit Information  Last PT Received On: 02/07/13 Assistance Needed: +2 History of Present Illness: Patient admitted for right TKA.  h/o TLIF L4-5 12/03/12.    Subjective Data  Patient Stated Goal: To go to rehab then home independent   Cognition  Cognition Arousal/Alertness: Awake/alert Behavior During Therapy: Agitated Overall Cognitive Status: No family/caregiver present to determine baseline cognitive functioning Area of Impairment: Memory;Following commands;Safety/judgement Orientation Level: Time;Place Memory: Decreased recall of precautions;Decreased short-term memory Following  Commands: Follows one step commands inconsistently Problem Solving: Slow processing;Difficulty sequencing General Comments: Pt does not recall working with therapy at all or that it has been 4 days since his surgery "that can't be right". Per his brother's report pt has a  poor pain tolerance, he did not specify more. Pt needs multimodal cues with all mobitly and incr time to prcess and incr time with assist to intiate movements.    End of Session PT - End of Session Activity Tolerance: Patient limited by pain;Treatment limited secondary to agitation Patient left: in bed;with nursing/sitter in room;with call bell/phone within reach Nurse Communication: Mobility status;Patient requests pain meds   GP     Willow Ora 02/07/2013, 2:50 PM  Willow Ora, PTA Office- [phone]

## 2013-02-07 NOTE — Progress Notes (Signed)
Patient ID: Marc [name], male   DOB: 04/12/1940, 73 y.o.   MRN: [id number]               PROGRESS NOTE  DATE: 12/08/2012  FACILITY: Nursing Home Location: [facility]  LEVEL OF CARE: SNF (31)  Acute Visit  CHIEF COMPLAINT:  Follow-up Hospitalization  HISTORY OF PRESENT ILLNESS: This is a 73 year old male who has been admitted to [facility] on 12/07/12 from [facility] with Left leg pain and weakness S/P left-sided lumbar 4-5 transforaminal lumbar interbody fusion. He has been admitted for a short-term rehabilitation.  REASSESSMENT OF ONGOING PROBLEM(S):  HTN: Pt 's HTN remains stable.  Denies CP, sob, DOE, pedal edema, headaches, dizziness or visual disturbances.  No complications from the medications currently being used.  Last BP : 143/72  GOUT: Patien'st  gout remains stable. Patient denies joint pan, redness, swelling or warmth. No complications reported from the medications presently being used.  GERD: pt's GERD is stable.  Denies ongoing heartburn, abd. Pain, nausea or vomiting.  Currently on a PPI & tolerates it without any adverse reactions. PAST MEDICAL HISTORY : Reviewed.  No changes.  CURRENT MEDICATIONS: Reviewed per [facility]  REVIEW OF SYSTEMS:  GENERAL: no change in appetite, no fatigue, no weight changes, no fever, chills or weakness RESPIRATORY: no cough, SOB, DOE, wheezing, hemoptysis CARDIAC: no chest pain, edema or palpitations GI: no abdominal pain, diarrhea, constipation, heart burn, nausea or vomiting  PHYSICAL EXAMINATION  VS:  T 98.9       P 84      RR 19      BP 143/72     POX 97 %     WT 204.8 (Lb)  GENERAL: no acute distress, normal body habitus EYES: conjunctivae normal, sclerae normal, normal eye lids NECK: supple, trachea midline, no neck masses, no thyroid tenderness, no thyromegaly LYMPHATICS: no LAN in the neck, no supraclavicular LAN RESPIRATORY: breathing is even & unlabored, BS CTAB CARDIAC: RRR, no murmur,no  extra heart sounds, no edema GI: abdomen soft, normal BS, no masses, no tenderness, no hepatomegaly, no splenomegaly PSYCHIATRIC: the patient is alert & oriented to person, affect & behavior appropriate  LABS/RADIOLOGY: 11/27/12 WBC 8.4 hemoglobin 13.1 hematocrit 38.1 sodium 141 potassium 5.1 glucose 143 BUN 28 creatinine 1.88 calcium 9.6 total protein 7.0 albumin 3.7   ASSESSMENT/PLAN:  Spinal stenosis of lumbar region status post fusion - for rehabilitation  Gout - continue allopurinol  Hypertension - well controlled; continue lisinopril  Diabetes mellitus, type II - well controlled; continue amaryl  Hyperlipidemia - continue Mevacor  GERD - stable; continue Prilosec   Constipation - continue Colace    CPT Code: [phone]

## 2013-02-08 ENCOUNTER — Encounter (HOSPITAL_COMMUNITY): Payer: Self-pay | Admitting: Orthopedic Surgery

## 2013-02-08 LAB — GLUCOSE, CAPILLARY: Glucose-Capillary: 150 mg/dL — ABNORMAL HIGH (ref 70–99)

## 2013-02-08 MED ORDER — OXYCODONE HCL ER 10 MG PO T12A: 5.0000 mg | EXTENDED_RELEASE_TABLET | Freq: Two times a day (BID) | ORAL | Status: DC

## 2013-02-08 MED ORDER — METHOCARBAMOL 500 MG PO TABS: 500.0000 mg | ORAL_TABLET | Freq: Two times a day (BID) | ORAL | Status: DC

## 2013-02-08 MED ORDER — ASPIRIN EC 325 MG PO TBEC
325.0000 mg | DELAYED_RELEASE_TABLET | Freq: Two times a day (BID) | ORAL | Status: DC
Start: 2013-02-08 — End: 2014-01-21

## 2013-02-08 NOTE — Progress Notes (Signed)
Patient ID: USBALDO PANNONE, male   DOB: 30-Aug-1940, 73 y.o.   MRN: [id number]              PROGRESS NOTE  DATE: 01/01/2013   FACILITY: [facility]  LEVEL OF CARE: SNF (31)  Acute Visit  CHIEF COMPLAINT:  Discharge Notes  HISTORY OF PRESENT ILLNESS: This is a 73 year old male who is for discharge home with Home health PT and OT. He hasbeen admitted to [facility] on 12/07/12 from [facility] with Left leg pain and weakness S/P left-sided lumbar 4-5 transforaminal lumbar interbody fusion.   Patient was admitted to this facility for short-term rehabilitation after the patient's recent hospitalization.  Patient has completed SNF rehabilitation and therapy has cleared the patient for discharge.  Reassessment of ongoing problem(s):  HTN: Pt 's HTN remains stable.  Denies CP, sob, DOE, pedal edema, headaches, dizziness or visual disturbances.  No complications from the medications currently being used.  Last BP : 117/59  DM:pt's DM remains stable.  Pt denies polyuria, polydipsia, polyphagia, changes in vision or hypoglycemic episodes.  No complications noted from the medication presently being used.   Then 11/14 hemoglobin A1c is: 6.9  HYPERLIPIDEMIA: No complications from the medications presently being used.   PAST MEDICAL HISTORY : Reviewed.  No changes.  CURRENT MEDICATIONS: Reviewed per [facility]  REVIEW OF SYSTEMS:  GENERAL: no change in appetite, no fatigue, no weight changes, no fever, chills or weakness RESPIRATORY: no cough, SOB, DOE, wheezing, hemoptysis CARDIAC: no chest pain, edema or palpitations GI: no abdominal pain, diarrhea, constipation, heart burn, nausea or vomiting  PHYSICAL EXAMINATION  VS:  T 96.1       P 54       RR 19      BP 117/59     POX 97 %       WT 195.6 (Lb)  GENERAL: no acute distress, normal body habitus NECK: supple, trachea midline, no neck masses, no thyroid tenderness, no thyromegaly LYMPHATICS: no LAN in the neck, no  supraclavicular LAN RESPIRATORY: breathing is even & unlabored, BS CTAB CARDIAC: RRR, no murmur,no extra heart sounds, no edema GI: abdomen soft, normal BS, no masses, no tenderness, no hepatomegaly, no splenomegaly PSYCHIATRIC: the patient is alert & oriented to person, affect & behavior appropriate  LABS/RADIOLOGY: 01/01/13 WBC 6.5 hemoglobin 10.0 hematocrit 32.5 12/10/12 hemoglobin 9.6 vitamin B12 166 folate 759 12/08/12 sodium 141 potassium 2.8 glucose 196 BUN 37 creatinine 1.3 calcium 9.2 WBC 6.0 hemoglobin 9.9 hematocrit 32.6 hemoglobin A1c 6.9 11/27/12 WBC 8.4 hemoglobin 13.1 hematocrit 38.1 sodium 141 potassium 5.1 glucose 143 BUN 28 creatinine 1.88 calcium 9.6 total protein 7.0 albumin 3.7   ASSESSMENT/PLAN:  Spinal stenosis of lumbar region status post fusion - for home health PT and OT  Gout - continue allopurinol  Hypertension - well controlled; continue lisinopril  Diabetes mellitus, type II - well controlled; continue amaryl  Hyperlipidemia - continue atorvastatin  GERD - stable; continue Prilosec   Constipation - continue Colace and MiraLax  I have filled out patient's discharge paperwork and written prescriptions.  Patient will receive home health PT and OT.   Total discharge time: Less than 30 minutes Discharge time involved coordination of the discharge process with Education officer, museum, nursing staff and therapy department. Medical justification for home health services verified.  CPT CODE: 94854

## 2013-02-08 NOTE — Discharge Summary (Signed)
Patient ID: Marc [name] MRN: [id number] DOB/AGE: Dec 10, 1940 73 y.o.  Admit date: 02/03/2013 Discharge date: 02/08/2013  Admission Diagnoses:  Active Problems:   Arthritis of right knee   Diabetes   Discharge Diagnoses:  Same  Past Medical History  Diagnosis Date  . Hypertension   . Ulcer disease   . Colon polyp   . Gout   . Bradycardia   . Hypercholesteremia   . PAD (peripheral artery disease)   . Chronic bronchitis     "used to be very susceptible when I was a kid & smoking" (02/04/2012)  . Type II diabetes mellitus   . History of blood transfusion ~ 2007    "10 of them when I had stomach bleeding"  . Bleeding stomach ulcer 2007  . Arthritis     "qwhere that you can get it; mostly in my shoulders" (02/04/2012)  . Gout   . S/P angioplasty with stent, to distal RT ext. iliac artery 03/10/2012  . Tobacco abuse disorder 03/10/2012  . GERD (gastroesophageal reflux disease)   . Muscle spasms of neck     has spasms over body when get up from sitting  . Pneumonia     Surgeries: Procedure(s): TOTAL KNEE ARTHROPLASTY on 02/03/2013   Consultants:    Discharged Condition: Improved  Hospital Course: KARIEM WOLFSON is an 73 y.o. male who was admitted 02/03/2013 for operative treatment of<principal problem not specified>. Patient has severe unremitting pain that affects sleep, daily activities, and work/hobbies. After pre-op clearance the patient was taken to the operating room on 02/03/2013 and underwent  Procedure(s): TOTAL KNEE ARTHROPLASTY.    Patient was given perioperative antibiotics: Anti-infectives   Start     Dose/Rate Route Frequency Ordered Stop   02/03/13 1303  cefUROXime (ZINACEF) injection  Status:  Discontinued       As needed 02/03/13 1303 02/03/13 1523   02/03/13 1135  ceFAZolin (ANCEF) 2-3 GM-% IVPB SOLR    Comments:  Ancil Boozer  : cabinet override      02/03/13 1135 02/03/13 2344   02/03/13 0600  ceFAZolin (ANCEF) IVPB 2 g/50 mL premix     2 g 100  mL/hr over 30 Minutes Intravenous On call to O.R. 02/02/13 1420 02/03/13 1330       Patient was given sequential compression devices, early ambulation, and chemoprophylaxis to prevent DVT.  Patient benefited maximally from hospital stay and there were no complications.    Recent vital signs: Patient Vitals for the past 24 hrs:  BP Temp Temp src Pulse Resp SpO2  02/08/13 0523 140/56 mmHg 98.1 F (36.7 C) Oral 77 18 99 %  02/07/13 2056 148/61 mmHg 98 F (36.7 C) - 71 19 97 %  02/07/13 1544 - - - - 20 97 %  02/07/13 1421 152/62 mmHg 98.7 F (37.1 C) - 69 20 96 %     Recent laboratory studies:  Recent Labs  02/06/13 0516  WBC 7.8  HGB 8.0*  HCT 24.3*  PLT 153     Discharge Medications:     Medication List         allopurinol 300 MG tablet  Commonly known as:  ZYLOPRIM  Take 300 mg by mouth daily.     aspirin EC 325 MG tablet  Take 1 tablet (325 mg total) by mouth 2 (two) times daily.     clopidogrel 75 MG tablet  Commonly known as:  PLAVIX  Take 75 mg by mouth daily with breakfast.  gabapentin 600 MG tablet  Commonly known as:  NEURONTIN  Take 1,200 mg by mouth every 8 (eight) hours.     glimepiride 4 MG tablet  Commonly known as:  AMARYL  Take 4 mg by mouth 2 (two) times daily.     HYDROcodone-acetaminophen 5-325 MG per tablet  Commonly known as:  NORCO/VICODIN  Take 1 tablet by mouth every 6 (six) hours as needed for moderate pain.     lisinopril 20 MG tablet  Commonly known as:  PRINIVIL,ZESTRIL  Take 20 mg by mouth daily.     lovastatin 40 MG tablet  Commonly known as:  MEVACOR  Take 40 mg by mouth at bedtime.     methocarbamol 500 MG tablet  Commonly known as:  ROBAXIN  Take 1 tablet (500 mg total) by mouth 2 (two) times daily with a meal.     omeprazole 20 MG tablet  Commonly known as:  PRILOSEC OTC  Take 40 mg by mouth daily.     OxyCODONE 10 mg T12a 12 hr tablet  Commonly known as:  OXYCONTIN  Take 1 tablet (10 mg total) by mouth  every 12 (twelve) hours.        Diagnostic Studies: Dg Knee 1-2 Views Right  02/06/2013   CLINICAL DATA:  Pain and swelling status post right total knee replacement  EXAM: RIGHT KNEE - 1-2 VIEW  COMPARISON:  02/27/2011 MRI  FINDINGS: Right total knee arthroplasty has been performed. Anterior midline staples remain in place. Components appear aligned. No definite hardware abnormality. Negative for fracture. Small effusion suspected on the lateral view.  IMPRESSION: Status post right knee arthroplasty. No acute fracture or hardware abnormality  Question small joint effusion   Electronically Signed   By: Daryll Brod M.D.   On: 02/06/2013 14:46    Disposition: 03-Skilled Nursing Facility      Discharge Orders   Future Orders Complete By Expires   Call MD / Call 911  As directed    Comments:     If you experience chest pain or shortness of breath, CALL 911 and be transported to the hospital emergency room.  If you develope a fever above 101 F, pus (white drainage) or increased drainage or redness at the wound, or calf pain, call your surgeon's office.   Change dressing  As directed    Comments:     Change dressing on 5, then change the dressing daily with sterile 4 x 4 inch gauze dressing and apply TED hose.  You may clean the incision with alcohol prior to redressing.   Constipation Prevention  As directed    Comments:     Drink plenty of fluids.  Prune juice may be helpful.  You may use a stool softener, such as Colace (over the counter) 100 mg twice a day.  Use MiraLax (over the counter) for constipation as needed.   CPM  As directed    Comments:     Continuous passive motion machine (CPM):      Use the CPM from 0 to 60  for 5 hours per day.      You may increase by 10 degrees per day.  You may break it up into 2 or 3 sessions per day.      Use CPM for 2 weeks or until you are told to stop.   Diet - low sodium heart healthy  As directed    Discharge instructions  As directed     Comments:  Follow up in office with Dr. [name] in 2 weeks.   Driving restrictions  As directed    Comments:     No driving for 2 weeks   Increase activity slowly as tolerated  As directed    Patient may shower  As directed    Comments:     You may shower without a dressing once there is no drainage.  Do not wash over the wound.  If drainage remains, cover wound with plastic wrap and then shower.      Follow-up Information   Follow up with [name], MD In 2 weeks.   Specialty:  Orthopedic Surgery   Contact information:   [place] [address] [phone]        Signed: Theodosia Quay 02/08/2013, 12:45 PM

## 2013-02-08 NOTE — Progress Notes (Signed)
Physical Therapy Treatment Patient Details Name: Marc [name] MRN: [id number] DOB: 03/16/1940 Today's Date: 02/08/2013 Time: 7829-5621 PT Time Calculation (min): 38 min  PT Assessment / Plan / Recommendation  History of Present Illness Patient admitted for right TKA.  h/o TLIF L4-5 12/03/12.   PT Comments   Better ability to participate today, though pain still limiting him severely  Follow Up Recommendations  SNF     Does the patient have the potential to tolerate intense rehabilitation     Barriers to Discharge        Equipment Recommendations  None recommended by PT    Recommendations for Other Services    Frequency 7X/week   Progress towards PT Goals Progress towards PT goals: Progressing toward goals  Plan Current plan remains appropriate    Precautions / Restrictions Precautions Precautions: Fall;Knee Restrictions RLE Weight Bearing: Weight bearing as tolerated   Pertinent Vitals/Pain 10/10 with any movement; Was premedicated patient repositioned for comfort and optimal knee extension    Mobility  Bed Mobility Overal bed mobility: Needs Assistance;+2 for physical assistance;+ 2 for safety/equipment Bed Mobility: Supine to Sit Supine to sit: +2 for physical assistance;HOB elevated;Max assist General bed mobility comments: Had to move slowly for pt's tolerance of activity; Required +2 assist for all aspects of getting up; careful support of knee as foot cleared bedside Transfers Overall transfer level: Needs assistance Equipment used: Rolling walker (2 wheeled) Transfers: Sit to/from Stand Sit to Stand: +2 physical assistance;+2 safety/equipment;Mod assist General transfer comment: Cues for technqiue, and to mostly stand on LLE if WBing through RLE is intolerable; Stood once from bed, and then from recliner, with noted imporvement in sit to stand from recliner using armrests Ambulation/Gait Ambulation/Gait assistance: +2 physical assistance;+2  safety/equipment;Max assist Ambulation Distance (Feet):  (Attempted) Assistive device: Rolling walker (2 wheeled) General Gait Details: Attempted taking steps, and pt was able to advance RLE, however extreme difficulty advancing LLE as he was unable to accept weight into R stance (knee blocked to prevent buckling)    Exercises Total Joint Exercises Ankle Circles/Pumps:  (Refused therex)   PT Diagnosis:    PT Problem List:   PT Treatment Interventions:     PT Goals (current goals can now be found in the care plan section) Acute Rehab PT Goals PT Goal Formulation: With patient Time For Goal Achievement: 02/18/13 Potential to Achieve Goals: Good  Visit Information  Last PT Received On: 02/08/13 Assistance Needed: +2 History of Present Illness: Patient admitted for right TKA.  h/o TLIF L4-5 12/03/12.    Subjective Data  Subjective: Very hesitant to move secondary to pain; Yelling and insisting we stop, but given rest breaks, he does move with encouragement   Cognition  Cognition Arousal/Alertness: Awake/alert Behavior During Therapy: Anxious;WFL for tasks assessed/performed Overall Cognitive Status: Within Functional Limits for tasks assessed    Balance  Balance Standing balance support: Bilateral upper extremity supported Standing balance-Leahy Scale: Poor Standing balance comment: stood with trunk flexed and occasional posterior lean; Numerous times yelled, "I'm falling"  End of Session PT - End of Session Equipment Utilized During Treatment: Gait belt Activity Tolerance: Patient limited by pain;Treatment limited secondary to agitation Patient left: in chair;with call bell/phone within reach;with family/visitor present (pt's grandson, Hadi, in room) Nurse Communication: Mobility status;Patient requests pain meds   GP     Roney Marion [facility] [place], [place]   02/08/2013, 4:07 PM

## 2013-02-08 NOTE — Progress Notes (Signed)
Clinical social worker assisted with patient discharge to skilled nursing facility,[facility].  CSW addressed all family questions and concerns. CSW copied chart and added all important documents. CSW also set up patient transportation with [facility]. Clinical Social Worker will sign off for now as social work intervention is no longer needed.   Kasumi Ditullio, MSW, LCSWA [phone] 

## 2013-02-08 NOTE — Progress Notes (Signed)
PATIENT ID: Marc [name]  MRN: [id number]  DOB/AGE:  May 01, 1940 / 73 y.o.  5 Days Post-Op Procedure(s) (LRB): TOTAL KNEE ARTHROPLASTY (Right)    PROGRESS NOTE Subjective: Patient is alert, oriented, no Nausea, no Vomiting, yes passing gas, no Bowel Movement. Taking PO well. Denies SOB, Chest or Calf Pain. Using Incentive Spirometer, PAS in place. Ambulate wbat, CPM 0-60, though pt has refused to use CMP at this time. Patient reports pain as severe .  Pt was combative when therapy tried to get pt up.  Therapy did notice small areas of redness on pt's sacrum.  Nursing notified.   Objective: Vital signs in last 24 hours: Filed Vitals:   02/07/13 1421 02/07/13 1544 02/07/13 2056 02/08/13 0523  BP: 152/62  148/61 140/56  Pulse: 69  71 77  Temp: 98.7 F (37.1 C)  98 F (36.7 C) 98.1 F (36.7 C)  TempSrc:    Oral  Resp: 20 20 19 18   Height:      Weight:      SpO2: 96% 97% 97% 99%      Intake/Output from previous day: I/O last 3 completed shifts: In: 7034.2 [P.O.:2580; I.V.:4454.2] Out: 3850 [Urine:3850]   Intake/Output this shift:     LABORATORY DATA:  Recent Labs  02/06/13 0516  02/07/13 1118 02/07/13 1622 02/07/13 2100  WBC 7.8  --   --   --   --   HGB 8.0*  --   --   --   --   HCT 24.3*  --   --   --   --   PLT 153  --   --   --   --   GLUCAP  --   < > 233* 167* 166*  < > = values in this interval not displayed.  Examination: Neurologically intact Neurovascular intact Sensation intact distally Intact pulses distally Dorsiflexion/Plantar flexion intact Incision: dressing C/D/I and no drainage No cellulitis present Compartment soft}   Assessment:   5 Days Post-Op Procedure(s) (LRB): TOTAL KNEE ARTHROPLASTY (Right) ADDITIONAL DIAGNOSIS:  Acute Blood Loss Anemia, Diabetes, Hypertension and PVD  Plan: PT/OT WBAT, CPM 5/hrs day until ROM 0-90 degrees, then D/C CPM DVT Prophylaxis:  SCDx72hrs, ASA 325 mg BID x 2 weeks DISCHARGE PLAN: Skilled Nursing  Facility/Rehab [facility] when pt passes PT goals DISCHARGE NEEDS: HHPT, HHRN, CPM, Walker and 3-in-1 comode seat     Analisse Randle R 02/08/2013, 10:19 AM

## 2013-02-09 LAB — GLUCOSE, CAPILLARY: GLUCOSE-CAPILLARY: 149 mg/dL — AB (ref 70–99)

## 2013-03-03 ENCOUNTER — Encounter (HOSPITAL_BASED_OUTPATIENT_CLINIC_OR_DEPARTMENT_OTHER): Payer: Medicare Other | Attending: General Surgery

## 2013-03-03 DIAGNOSIS — M109 Gout, unspecified: Secondary | ICD-10-CM | POA: Insufficient documentation

## 2013-03-03 DIAGNOSIS — Z79899 Other long term (current) drug therapy: Secondary | ICD-10-CM | POA: Insufficient documentation

## 2013-03-03 DIAGNOSIS — R609 Edema, unspecified: Secondary | ICD-10-CM | POA: Insufficient documentation

## 2013-03-03 DIAGNOSIS — Z7982 Long term (current) use of aspirin: Secondary | ICD-10-CM | POA: Insufficient documentation

## 2013-03-03 DIAGNOSIS — L89899 Pressure ulcer of other site, unspecified stage: Secondary | ICD-10-CM | POA: Insufficient documentation

## 2013-03-03 DIAGNOSIS — Z7902 Long term (current) use of antithrombotics/antiplatelets: Secondary | ICD-10-CM | POA: Insufficient documentation

## 2013-03-03 DIAGNOSIS — L8990 Pressure ulcer of unspecified site, unspecified stage: Secondary | ICD-10-CM | POA: Insufficient documentation

## 2013-03-03 DIAGNOSIS — Z981 Arthrodesis status: Secondary | ICD-10-CM | POA: Insufficient documentation

## 2013-03-03 DIAGNOSIS — Z96659 Presence of unspecified artificial knee joint: Secondary | ICD-10-CM | POA: Insufficient documentation

## 2013-03-03 DIAGNOSIS — L89109 Pressure ulcer of unspecified part of back, unspecified stage: Secondary | ICD-10-CM | POA: Insufficient documentation

## 2013-03-03 DIAGNOSIS — I739 Peripheral vascular disease, unspecified: Secondary | ICD-10-CM | POA: Insufficient documentation

## 2013-03-03 DIAGNOSIS — M7989 Other specified soft tissue disorders: Secondary | ICD-10-CM | POA: Insufficient documentation

## 2013-03-03 DIAGNOSIS — I10 Essential (primary) hypertension: Secondary | ICD-10-CM | POA: Insufficient documentation

## 2013-03-03 DIAGNOSIS — E119 Type 2 diabetes mellitus without complications: Secondary | ICD-10-CM | POA: Insufficient documentation

## 2013-03-03 LAB — GLUCOSE, CAPILLARY: Glucose-Capillary: 153 mg/dL — ABNORMAL HIGH (ref 70–99)

## 2013-03-04 NOTE — Progress Notes (Signed)
[facility]  NAME:  Marc [name], Marc [name] NO.:  [id number]  MEDICAL RECORD NO.:  [id number]      DATE OF BIRTH:  05/02/1940  PHYSICIAN:  Judene Companion, M.D.           VISIT DATE:                                  OFFICE VISIT   This is a 73 year old gentleman who was sent to [place] with what looks like a pressure ulcers on his right leg.  He also has a small pressure ulcer on his sacrum.  These came after he had a total knee done on the right and had a prolonged hospitalization.  He is getting physical therapy at this time, but his right leg is markedly swollen.  He has 2+ pretibial edema.  This gentleman is also a type 2 diabetic.  He has peripheral vascular disease.  He has had a shunt already placed in his right iliac artery.  He says that he does have claudication.  He also has hypertension and he has had several surgeries including an anterior cervical decompression with fusion.  He has also had hernia repair.  He has had a stent placed in his superior femoral artery.  His medications include allopurinol, aspirin, Plavix, Neurontin, Amaryl, Norco, Prinivil, Mevacor, Robaxin, Prilosec and OxyContin.  He was found to have a blood pressure of 146/90, a pulse of 69, respirations 18.  His weight is 194, and his blood glucose today was 153.  He has these pressure ulcers that are fairly small.  They are only 8 or 9 mm in diameter, and they are located on his lower leg and the pressure ulcer on his sacrum is about a cm.  I felt that we could treat these with collagen and we would wrap his leg in Coban Lite to try to get some of the edema out.  We left all of his medicines the same, and we will see him back in a week.  His diagnoses is pressure ulcers right leg and sacrum, type 2 diabetes, hypertension, peripheral vascular disease, and a history of gout.     Judene Companion, M.D.     PP/MEDQ  D:  03/03/2013  T:  03/04/2013  Job:  [id number]

## 2013-03-10 LAB — GLUCOSE, CAPILLARY: GLUCOSE-CAPILLARY: 254 mg/dL — AB (ref 70–99)

## 2013-03-15 ENCOUNTER — Other Ambulatory Visit: Payer: Self-pay | Admitting: *Deleted

## 2013-03-15 MED ORDER — CLOPIDOGREL BISULFATE 75 MG PO TABS: 75.0000 mg | ORAL_TABLET | Freq: Every day | ORAL | Status: DC

## 2013-03-24 LAB — GLUCOSE, CAPILLARY: GLUCOSE-CAPILLARY: 245 mg/dL — AB (ref 70–99)

## 2013-03-31 ENCOUNTER — Encounter (HOSPITAL_BASED_OUTPATIENT_CLINIC_OR_DEPARTMENT_OTHER): Payer: Medicare Other | Attending: General Surgery

## 2013-03-31 DIAGNOSIS — L8992 Pressure ulcer of unspecified site, stage 2: Secondary | ICD-10-CM | POA: Insufficient documentation

## 2013-03-31 DIAGNOSIS — S99919A Unspecified injury of unspecified ankle, initial encounter: Secondary | ICD-10-CM

## 2013-03-31 DIAGNOSIS — L97309 Non-pressure chronic ulcer of unspecified ankle with unspecified severity: Secondary | ICD-10-CM | POA: Insufficient documentation

## 2013-03-31 DIAGNOSIS — L89609 Pressure ulcer of unspecified heel, unspecified stage: Secondary | ICD-10-CM | POA: Insufficient documentation

## 2013-03-31 DIAGNOSIS — X58XXXA Exposure to other specified factors, initial encounter: Secondary | ICD-10-CM | POA: Insufficient documentation

## 2013-03-31 DIAGNOSIS — E1169 Type 2 diabetes mellitus with other specified complication: Secondary | ICD-10-CM | POA: Insufficient documentation

## 2013-03-31 DIAGNOSIS — L97409 Non-pressure chronic ulcer of unspecified heel and midfoot with unspecified severity: Secondary | ICD-10-CM | POA: Insufficient documentation

## 2013-03-31 DIAGNOSIS — S99929A Unspecified injury of unspecified foot, initial encounter: Secondary | ICD-10-CM

## 2013-03-31 DIAGNOSIS — S8990XA Unspecified injury of unspecified lower leg, initial encounter: Secondary | ICD-10-CM | POA: Insufficient documentation

## 2013-03-31 LAB — GLUCOSE, CAPILLARY: Glucose-Capillary: 180 mg/dL — ABNORMAL HIGH (ref 70–99)

## 2013-04-22 LAB — GLUCOSE, CAPILLARY: GLUCOSE-CAPILLARY: 187 mg/dL — AB (ref 70–99)

## 2013-04-28 ENCOUNTER — Encounter (HOSPITAL_BASED_OUTPATIENT_CLINIC_OR_DEPARTMENT_OTHER): Payer: Medicare Other | Attending: General Surgery

## 2013-04-28 DIAGNOSIS — L97409 Non-pressure chronic ulcer of unspecified heel and midfoot with unspecified severity: Secondary | ICD-10-CM | POA: Insufficient documentation

## 2013-04-28 DIAGNOSIS — Y831 Surgical operation with implant of artificial internal device as the cause of abnormal reaction of the patient, or of later complication, without mention of misadventure at the time of the procedure: Secondary | ICD-10-CM | POA: Insufficient documentation

## 2013-04-28 DIAGNOSIS — T8189XA Other complications of procedures, not elsewhere classified, initial encounter: Secondary | ICD-10-CM | POA: Insufficient documentation

## 2013-04-28 DIAGNOSIS — L97309 Non-pressure chronic ulcer of unspecified ankle with unspecified severity: Secondary | ICD-10-CM | POA: Insufficient documentation

## 2013-04-28 DIAGNOSIS — E1169 Type 2 diabetes mellitus with other specified complication: Secondary | ICD-10-CM | POA: Insufficient documentation

## 2013-06-02 ENCOUNTER — Encounter (HOSPITAL_BASED_OUTPATIENT_CLINIC_OR_DEPARTMENT_OTHER): Payer: Medicare Other | Attending: General Surgery

## 2013-06-02 DIAGNOSIS — E1169 Type 2 diabetes mellitus with other specified complication: Secondary | ICD-10-CM | POA: Insufficient documentation

## 2013-06-02 DIAGNOSIS — L97309 Non-pressure chronic ulcer of unspecified ankle with unspecified severity: Secondary | ICD-10-CM | POA: Insufficient documentation

## 2013-06-16 LAB — GLUCOSE, CAPILLARY: GLUCOSE-CAPILLARY: 128 mg/dL — AB (ref 70–99)

## 2013-06-30 ENCOUNTER — Encounter (HOSPITAL_BASED_OUTPATIENT_CLINIC_OR_DEPARTMENT_OTHER): Payer: Medicare Other | Attending: General Surgery

## 2013-06-30 ENCOUNTER — Other Ambulatory Visit: Payer: Self-pay | Admitting: Cardiovascular Disease

## 2013-06-30 DIAGNOSIS — L97409 Non-pressure chronic ulcer of unspecified heel and midfoot with unspecified severity: Secondary | ICD-10-CM | POA: Insufficient documentation

## 2013-06-30 DIAGNOSIS — E1169 Type 2 diabetes mellitus with other specified complication: Secondary | ICD-10-CM | POA: Insufficient documentation

## 2013-07-06 NOTE — Telephone Encounter (Signed)
Rx was sent to pharmacy electronically. Last OV 03/2012

## 2013-07-07 LAB — GLUCOSE, CAPILLARY: Glucose-Capillary: 118 mg/dL — ABNORMAL HIGH (ref 70–99)

## 2013-07-14 LAB — GLUCOSE, CAPILLARY: GLUCOSE-CAPILLARY: 176 mg/dL — AB (ref 70–99)

## 2013-07-28 ENCOUNTER — Encounter (HOSPITAL_BASED_OUTPATIENT_CLINIC_OR_DEPARTMENT_OTHER): Payer: Medicare Other | Attending: General Surgery

## 2013-07-28 DIAGNOSIS — L97509 Non-pressure chronic ulcer of other part of unspecified foot with unspecified severity: Secondary | ICD-10-CM | POA: Diagnosis not present

## 2013-07-28 DIAGNOSIS — L97809 Non-pressure chronic ulcer of other part of unspecified lower leg with unspecified severity: Secondary | ICD-10-CM | POA: Diagnosis not present

## 2013-07-28 DIAGNOSIS — E1169 Type 2 diabetes mellitus with other specified complication: Secondary | ICD-10-CM | POA: Diagnosis not present

## 2013-07-28 LAB — GLUCOSE, CAPILLARY: Glucose-Capillary: 184 mg/dL — ABNORMAL HIGH (ref 70–99)

## 2013-08-04 DIAGNOSIS — E1169 Type 2 diabetes mellitus with other specified complication: Secondary | ICD-10-CM | POA: Diagnosis not present

## 2013-08-04 DIAGNOSIS — L97809 Non-pressure chronic ulcer of other part of unspecified lower leg with unspecified severity: Secondary | ICD-10-CM | POA: Diagnosis not present

## 2013-08-04 DIAGNOSIS — L97509 Non-pressure chronic ulcer of other part of unspecified foot with unspecified severity: Secondary | ICD-10-CM | POA: Diagnosis not present

## 2013-08-11 DIAGNOSIS — L97809 Non-pressure chronic ulcer of other part of unspecified lower leg with unspecified severity: Secondary | ICD-10-CM | POA: Diagnosis not present

## 2013-08-11 DIAGNOSIS — E1169 Type 2 diabetes mellitus with other specified complication: Secondary | ICD-10-CM | POA: Diagnosis not present

## 2013-08-11 DIAGNOSIS — L97509 Non-pressure chronic ulcer of other part of unspecified foot with unspecified severity: Secondary | ICD-10-CM | POA: Diagnosis not present

## 2013-09-02 ENCOUNTER — Other Ambulatory Visit (HOSPITAL_COMMUNITY): Payer: Self-pay | Admitting: Family Medicine

## 2013-09-02 ENCOUNTER — Ambulatory Visit (HOSPITAL_COMMUNITY)
Admission: RE | Admit: 2013-09-02 | Discharge: 2013-09-02 | Disposition: A | Discharge status: Home / Self Care | Payer: Medicare Other | Source: Ambulatory Visit | Attending: Family Medicine | Admitting: Family Medicine

## 2013-09-02 DIAGNOSIS — M79609 Pain in unspecified limb: Secondary | ICD-10-CM | POA: Insufficient documentation

## 2013-09-02 DIAGNOSIS — M7989 Other specified soft tissue disorders: Secondary | ICD-10-CM

## 2013-09-02 DIAGNOSIS — M79604 Pain in right leg: Secondary | ICD-10-CM

## 2013-09-02 NOTE — Progress Notes (Signed)
VASCULAR LAB PRELIMINARY  PRELIMINARY  PRELIMINARY  PRELIMINARY  Right lower extremity venous duplex completed.    Preliminary report:  Right:  No evidence of DVT, superficial thrombosis, or Baker's cyst.  [place]ana W[place]all, RVT 09/02/2013, 11:11 AM

## 2013-09-06 ENCOUNTER — Encounter (HOSPITAL_COMMUNITY): Payer: Self-pay | Admitting: Emergency Medicine

## 2013-09-06 ENCOUNTER — Telehealth: Payer: Self-pay | Admitting: Cardiovascular Disease

## 2013-09-06 ENCOUNTER — Observation Stay (HOSPITAL_COMMUNITY): Payer: Medicare Other

## 2013-09-06 ENCOUNTER — Inpatient Hospital Stay (HOSPITAL_COMMUNITY)
Admission: EM | Admit: 2013-09-06 | Discharge: 2013-09-10 | DRG: 372 | Disposition: A | Discharge status: Home / Self Care | Payer: Medicare Other | Attending: Internal Medicine | Admitting: Internal Medicine

## 2013-09-06 DIAGNOSIS — I739 Peripheral vascular disease, unspecified: Secondary | ICD-10-CM

## 2013-09-06 DIAGNOSIS — Z7982 Long term (current) use of aspirin: Secondary | ICD-10-CM

## 2013-09-06 DIAGNOSIS — M1711 Unilateral primary osteoarthritis, right knee: Secondary | ICD-10-CM

## 2013-09-06 DIAGNOSIS — Z96659 Presence of unspecified artificial knee joint: Secondary | ICD-10-CM

## 2013-09-06 DIAGNOSIS — Z791 Long term (current) use of non-steroidal anti-inflammatories (NSAID): Secondary | ICD-10-CM

## 2013-09-06 DIAGNOSIS — M7989 Other specified soft tissue disorders: Secondary | ICD-10-CM | POA: Diagnosis present

## 2013-09-06 DIAGNOSIS — M109 Gout, unspecified: Secondary | ICD-10-CM | POA: Diagnosis present

## 2013-09-06 DIAGNOSIS — E785 Hyperlipidemia, unspecified: Secondary | ICD-10-CM | POA: Diagnosis present

## 2013-09-06 DIAGNOSIS — K219 Gastro-esophageal reflux disease without esophagitis: Secondary | ICD-10-CM | POA: Diagnosis present

## 2013-09-06 DIAGNOSIS — L0390 Cellulitis, unspecified: Secondary | ICD-10-CM | POA: Diagnosis present

## 2013-09-06 DIAGNOSIS — M5136 Other intervertebral disc degeneration, lumbar region: Secondary | ICD-10-CM

## 2013-09-06 DIAGNOSIS — I10 Essential (primary) hypertension: Secondary | ICD-10-CM | POA: Diagnosis present

## 2013-09-06 DIAGNOSIS — Z8249 Family history of ischemic heart disease and other diseases of the circulatory system: Secondary | ICD-10-CM

## 2013-09-06 DIAGNOSIS — L02419 Cutaneous abscess of limb, unspecified: Secondary | ICD-10-CM | POA: Diagnosis present

## 2013-09-06 DIAGNOSIS — K6812 Psoas muscle abscess: Principal | ICD-10-CM | POA: Diagnosis present

## 2013-09-06 DIAGNOSIS — Z7902 Long term (current) use of antithrombotics/antiplatelets: Secondary | ICD-10-CM

## 2013-09-06 DIAGNOSIS — L03115 Cellulitis of right lower limb: Secondary | ICD-10-CM

## 2013-09-06 DIAGNOSIS — L03119 Cellulitis of unspecified part of limb: Secondary | ICD-10-CM

## 2013-09-06 DIAGNOSIS — I70219 Atherosclerosis of native arteries of extremities with intermittent claudication, unspecified extremity: Secondary | ICD-10-CM | POA: Diagnosis present

## 2013-09-06 DIAGNOSIS — Z720 Tobacco use: Secondary | ICD-10-CM

## 2013-09-06 DIAGNOSIS — I119 Hypertensive heart disease without heart failure: Secondary | ICD-10-CM | POA: Diagnosis present

## 2013-09-06 DIAGNOSIS — F17200 Nicotine dependence, unspecified, uncomplicated: Secondary | ICD-10-CM | POA: Diagnosis present

## 2013-09-06 DIAGNOSIS — I70209 Unspecified atherosclerosis of native arteries of extremities, unspecified extremity: Secondary | ICD-10-CM | POA: Diagnosis present

## 2013-09-06 DIAGNOSIS — M79604 Pain in right leg: Secondary | ICD-10-CM

## 2013-09-06 DIAGNOSIS — M79609 Pain in unspecified limb: Secondary | ICD-10-CM | POA: Diagnosis not present

## 2013-09-06 DIAGNOSIS — IMO0001 Reserved for inherently not codable concepts without codable children: Secondary | ICD-10-CM

## 2013-09-06 DIAGNOSIS — E1169 Type 2 diabetes mellitus with other specified complication: Secondary | ICD-10-CM | POA: Diagnosis present

## 2013-09-06 DIAGNOSIS — IMO0002 Reserved for concepts with insufficient information to code with codable children: Secondary | ICD-10-CM

## 2013-09-06 DIAGNOSIS — Z66 Do not resuscitate: Secondary | ICD-10-CM | POA: Diagnosis present

## 2013-09-06 DIAGNOSIS — Z95820 Peripheral vascular angioplasty status with implants and grafts: Secondary | ICD-10-CM

## 2013-09-06 DIAGNOSIS — M48061 Spinal stenosis, lumbar region without neurogenic claudication: Secondary | ICD-10-CM | POA: Diagnosis present

## 2013-09-06 LAB — CBC WITH DIFFERENTIAL/PLATELET
BASOS ABS: 0.1 10*3/uL (ref 0.0–0.1)
Basophils Relative: 1 % (ref 0–1)
EOS PCT: 5 % (ref 0–5)
Eosinophils Absolute: 0.4 10*3/uL (ref 0.0–0.7)
HEMATOCRIT: 43 % (ref 39.0–52.0)
Hemoglobin: 14.1 g/dL (ref 13.0–17.0)
LYMPHS ABS: 1.6 10*3/uL (ref 0.7–4.0)
Lymphocytes Relative: 23 % (ref 12–46)
MCH: 31.6 pg (ref 26.0–34.0)
MCHC: 32.8 g/dL (ref 30.0–36.0)
MCV: 96.4 fL (ref 78.0–100.0)
MONO ABS: 0.4 10*3/uL (ref 0.1–1.0)
Monocytes Relative: 6 % (ref 3–12)
Neutro Abs: 4.5 10*3/uL (ref 1.7–7.7)
Neutrophils Relative %: 65 % (ref 43–77)
Platelets: 201 10*3/uL (ref 150–400)
RBC: 4.46 MIL/uL (ref 4.22–5.81)
RDW: 14.6 % (ref 11.5–15.5)
WBC: 6.9 10*3/uL (ref 4.0–10.5)

## 2013-09-06 LAB — GLUCOSE, CAPILLARY
GLUCOSE-CAPILLARY: 80 mg/dL (ref 70–99)
Glucose-Capillary: 111 mg/dL — ABNORMAL HIGH (ref 70–99)
Glucose-Capillary: 172 mg/dL — ABNORMAL HIGH (ref 70–99)

## 2013-09-06 LAB — BASIC METABOLIC PANEL
Anion gap: 12 (ref 5–15)
BUN: 23 mg/dL (ref 6–23)
CALCIUM: 9.4 mg/dL (ref 8.4–10.5)
CO2: 28 meq/L (ref 19–32)
CREATININE: 1.34 mg/dL (ref 0.50–1.35)
Chloride: 105 mEq/L (ref 96–112)
GFR calc Af Amer: 59 mL/min — ABNORMAL LOW (ref >90)
GFR calc non Af Amer: 51 mL/min — ABNORMAL LOW (ref >90)
Glucose, Bld: 206 mg/dL — ABNORMAL HIGH (ref 70–99)
Potassium: 4.8 mEq/L (ref 3.7–5.3)
Sodium: 145 mEq/L (ref 137–147)

## 2013-09-06 MED ORDER — ASPIRIN EC 81 MG PO TBEC
81.0000 mg | DELAYED_RELEASE_TABLET | Freq: Every day | ORAL | Status: DC
  Administered 2013-09-06 – 2013-09-10 (×5): 81 mg via ORAL
  Filled 2013-09-06 (×5): qty 1

## 2013-09-06 MED ORDER — SIMVASTATIN 20 MG PO TABS
20.0000 mg | ORAL_TABLET | Freq: Every day | ORAL | Status: DC
  Administered 2013-09-06 – 2013-09-09 (×4): 20 mg via ORAL
  Filled 2013-09-06 (×2): qty 2
  Filled 2013-09-06 (×5): qty 1

## 2013-09-06 MED ORDER — LISINOPRIL 20 MG PO TABS
20.0000 mg | ORAL_TABLET | Freq: Every day | ORAL | Status: DC
  Administered 2013-09-06 – 2013-09-10 (×5): 20 mg via ORAL
  Filled 2013-09-06 (×5): qty 1

## 2013-09-06 MED ORDER — HYDROCODONE-ACETAMINOPHEN 5-325 MG PO TABS
2.0000 | ORAL_TABLET | Freq: Three times a day (TID) | ORAL | Status: DC | PRN
  Administered 2013-09-06 – 2013-09-07 (×2): 2 via ORAL
  Filled 2013-09-06 (×2): qty 2

## 2013-09-06 MED ORDER — CLOPIDOGREL BISULFATE 75 MG PO TABS
75.0000 mg | ORAL_TABLET | Freq: Every day | ORAL | Status: DC
  Administered 2013-09-06 – 2013-09-08 (×3): 75 mg via ORAL
  Filled 2013-09-06 (×3): qty 1

## 2013-09-06 MED ORDER — ALLOPURINOL 300 MG PO TABS
300.0000 mg | ORAL_TABLET | Freq: Every day | ORAL | Status: DC
  Administered 2013-09-06 – 2013-09-10 (×5): 300 mg via ORAL
  Filled 2013-09-06 (×5): qty 1

## 2013-09-06 MED ORDER — INSULIN ASPART 100 UNIT/ML ~~LOC~~ SOLN
0.0000 [IU] | Freq: Three times a day (TID) | SUBCUTANEOUS | Status: DC
  Administered 2013-09-06: 2 [IU] via SUBCUTANEOUS
  Administered 2013-09-07: 1 [IU] via SUBCUTANEOUS
  Administered 2013-09-07 – 2013-09-09 (×3): 2 [IU] via SUBCUTANEOUS
  Administered 2013-09-09 – 2013-09-10 (×2): 1 [IU] via SUBCUTANEOUS

## 2013-09-06 MED ORDER — OMEPRAZOLE MAGNESIUM 20 MG PO TBEC: 20.0000 mg | DELAYED_RELEASE_TABLET | Freq: Every day | ORAL | Status: DC

## 2013-09-06 MED ORDER — GABAPENTIN 600 MG PO TABS
1200.0000 mg | ORAL_TABLET | Freq: Three times a day (TID) | ORAL | Status: DC
  Administered 2013-09-06 – 2013-09-10 (×12): 1200 mg via ORAL
  Filled 2013-09-06 (×17): qty 2

## 2013-09-06 MED ORDER — PANTOPRAZOLE SODIUM 40 MG PO TBEC
40.0000 mg | DELAYED_RELEASE_TABLET | Freq: Every day | ORAL | Status: DC
  Administered 2013-09-06 – 2013-09-10 (×5): 40 mg via ORAL
  Filled 2013-09-06 (×5): qty 1

## 2013-09-06 MED ORDER — HEPARIN SODIUM (PORCINE) 5000 UNIT/ML IJ SOLN
5000.0000 [IU] | Freq: Three times a day (TID) | INTRAMUSCULAR | Status: DC
  Administered 2013-09-06 – 2013-09-10 (×9): 5000 [IU] via SUBCUTANEOUS
  Filled 2013-09-06 (×17): qty 1

## 2013-09-06 NOTE — H&P (Signed)
Date: 09/06/2013               Patient Name:  Marc [name] MRN: [id number]  DOB: [birth year] Age / Sex: 73 y.o., male   PCP: [name], MD         Medical Service: Internal Medicine Teaching Service         Attending Physician: Dr. Carlyle Basques, MD    First Contact: Dr. Genene Churn Pager: [phone]  Second Contact: Dr. Hayes Ludwig Pager: [phone]       After Hours (After 5p/  First Contact Pager: [phone]  weekends / holidays): Second Contact Pager: [phone]   Chief Complaint: right foot swelling and right hip pain  History of Present Illness:   73 yo male with hx of HTN, gout, bradycardia, DM II, PAD s/p stent R ext iliac artery, and hx of recent cellulitis here with increased right foot swelling for few days and also right knee/hip pain for 3 weeks. He had a right knee replacement done on 01/2013, was doing well after that. 3 weeks ago he started having pain on the right leg. Pain comes with touching his knee and it's severe that shoots up to the hip. Denies prolonged statis, fever, chills.   He has wound on right foot that has been healing well. Was on doxy for 1 month, finished 2 weeks ago. No puss or drainage.   He also has pain on the left lateral chest wall for 3 weeks which his PCP thought was shingles. He hasn't seen a rash. Pain is worse with palpation and movement. No cough, sob, substernal chest pain.     Meds: Current Facility-Administered Medications  Medication Dose Route Frequency Provider Last Rate Last Dose  . allopurinol (ZYLOPRIM) tablet 300 mg  300 mg Oral Daily Wilber Oliphant, MD      . aspirin EC tablet 81 mg  81 mg Oral Daily Wilber Oliphant, MD      . clopidogrel (PLAVIX) tablet 75 mg  75 mg Oral Daily Wilber Oliphant, MD      . gabapentin (NEURONTIN) tablet 1,200 mg  1,200 mg Oral 3 times per day Wilber Oliphant, MD      . heparin injection 5,000 Units  5,000 Units Subcutaneous 3 times per day Wilber Oliphant, MD      .  HYDROcodone-acetaminophen (NORCO/VICODIN) 5-325 MG per tablet 2 tablet  2 tablet Oral Q8H PRN Wilber Oliphant, MD      . lisinopril (PRINIVIL,ZESTRIL) tablet 20 mg  20 mg Oral Daily Wilber Oliphant, MD      . pantoprazole (PROTONIX) EC tablet 40 mg  40 mg Oral Daily Carlyle Basques, MD      . simvastatin (ZOCOR) tablet 20 mg  20 mg Oral q1800 Wilber Oliphant, MD        Allergies: Allergies as of 09/06/2013  . (No Known Allergies)   Past Medical History  Diagnosis Date  . Hypertension   . Ulcer disease   . Colon polyp   . Gout   . Bradycardia   . Hypercholesteremia   . PAD (peripheral artery disease)   . Chronic bronchitis     "used to be very susceptible when I was a kid & smoking" (02/04/2012)  . Type II diabetes mellitus   . History of blood transfusion ~ 2007    "10 of them when I had stomach bleeding"  . Bleeding stomach ulcer 2007  . Arthritis     "qwhere  that you can get it; mostly in my shoulders" (02/04/2012)  . Gout   . S/P angioplasty with stent, to distal RT ext. iliac artery 03/10/2012  . Tobacco abuse disorder 03/10/2012  . GERD (gastroesophageal reflux disease)   . Muscle spasms of neck     has spasms over body when get up from sitting  . Pneumonia    Past Surgical History  Procedure Laterality Date  . Splenectomy  ~ 1957  . Anterior cervical decomp/discectomy fusion  ~ 2007  . Upper endoscopy w/ sclerotherapy  ~ 2007  . Polypectomy    . Colonoscopy    . Iliac artery stent      left common/notes (02/04/2012)  . Hernia repair  ~ 2007    UHR (02/04/2012)  . Tonsillectomy  ~ 1947  . Sfa    . Sfa  03/10/2012    ILLEAC STENOSIS  . Back surgery  11/14  . Total knee arthroplasty Right 02/03/2013    Procedure: TOTAL KNEE ARTHROPLASTY;  Surgeon: [name], MD;  Location: [facility];  Service: Orthopedics;  Laterality: Right;   Family History  Problem Relation Age of Onset  . Colon cancer Paternal Uncle     Uncle  . Heart disease Father   . Heart disease Mother     History   Social History  . Marital Status: Married    Spouse Name: N/A    Number of Children: 2  . Years of Education: N/A   Occupational History  . retired   . cattle farm     owner   Social History Main Topics  . Smoking status: Current Every Day Smoker -- 2.00 packs/day for 58 years    Types: Cigarettes  . Smokeless tobacco: Former Systems developer    Types: Chew     Comment: occ alcohol  . 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 Drug Use: No  . Sexual Activity: No   Other Topics Concern  . Not on file   Social History Narrative  . No narrative on file    Review of Systems: Review of Systems  Constitutional: Negative for fever, chills, weight loss and diaphoresis.  HENT: Negative for congestion and sore throat.   Eyes: Negative.   Respiratory: Negative for cough, hemoptysis, sputum production, shortness of breath and wheezing.   Cardiovascular: Positive for chest pain and leg swelling. Negative for palpitations, orthopnea, claudication and PND.  Gastrointestinal: Negative.   Genitourinary: Negative.   Musculoskeletal: Positive for back pain and joint pain. Negative for falls and neck pain.  Skin: Negative.   Neurological: Negative.  Negative for weakness and headaches.  Endo/Heme/Allergies: Negative.   Psychiatric/Behavioral: Negative.     Physical Exam: Blood pressure 132/79, pulse 74, temperature 97.4 F (36.3 C), temperature source Oral, resp. rate 18, height 6\' 1"  (1.854 m), weight 92.534 kg (204 lb), SpO2 99.00%. Physical Exam  Constitutional: He is oriented to person, place, and time. He appears well-developed and well-nourished. No distress.  HENT:  Head: Normocephalic and atraumatic.  Right Ear: External ear normal.  Left Ear: External ear normal.  Nose: Nose normal.  Mouth/Throat: Oropharynx is clear and moist.  Eyes: Conjunctivae  and EOM are normal. Pupils are equal, round, and reactive to light. Right eye exhibits no discharge. Left eye exhibits no discharge.  Neck:  Normal range of motion. Neck supple. No JVD present.  Cardiovascular: S1 normal and S2 normal.  Bradycardia present.  Exam reveals no gallop.   No murmur heard. Respiratory: Effort normal. No apnea. No respiratory distress.    TTP on left lateral chest wall. No rash or lesions on chest.   GI: Soft. Normal appearance.  Musculoskeletal:  Left knee is normal. Left foot normal.  Right knee TTP anteriorly and just superior to the patella. No swelling or effusion noted. No erythema or other skin changes. Feels normal temperature.   Right foot non tender, nonerythematous. Has swelling on dorsal surface. There are 2 skin lesions around the ankle joints medially and laterally, both well healed without discharge.  See picture.  Neurological: He is alert and oriented to person, place, and time. He has normal strength. No sensory deficit. GCS eye subscore is 4. GCS verbal subscore is 5. GCS motor subscore is 6.  Skin: He is not diaphoretic.         Lab results: Basic Metabolic Panel:  Recent Labs  09/06/13 0843  NA 145  K 4.8  CL 105  CO2 28  GLUCOSE 206*  BUN 23  CREATININE 1.34  CALCIUM 9.4   Liver Function Tests: No results found for this basename: AST, ALT, ALKPHOS, BILITOT, PROT, ALBUMIN,  in the last 72 hours No results found for this basename: LIPASE, AMYLASE,  in the last 72 hours No results found for this basename: AMMONIA,  in the last 72 hours CBC:  Recent Labs  09/06/13 0843  WBC 6.9  NEUTROABS 4.5  HGB 14.1  HCT 43.0  MCV 96.4  PLT 201   Cardiac Enzymes: No results found for this basename: CKTOTAL, CKMB, CKMBINDEX, TROPONINI,  in the last 72 hours BNP: No results found for this basename: PROBNP,  in the last 72 hours D-Dimer: No results found for this basename: DDIMER,  in the last 72 hours CBG:  Recent Labs   09/06/13 1328  GLUCAP 111*    Imaging results:  No results found.  Other results: EKG:   Assessment & Plan by Problem: Principal Problem:   Right leg swelling Active Problems:   Diabetes mellitus   Hypertension   PVD- s/p  PTA and stenting of left common/external iliac artery 1/7   Claudication in peripheral vascular disease, life style limiting   S/P angioplasty with stent, to distal RT ext. iliac artery   Tobacco abuse disorder   Spinal stenosis of lumbar region   Hyperlipidemia   Cellulitis   PAD (peripheral artery disease)  73 yo male with hx of PAD, HTN, recently treated cellulitis here with RLE swelling and R knee/hip pain.   Right extremity swelling  unclear etiology. Low suspicion of cellulitis since he has no systemic signs of infection, there is no obvious local signs of infection. R foot slightly warm and more edematous than left. However, given his severe PAD and lack of tenderness of the foot the picture is complicated. It points to other possible causes. He has worsening arterial dz appears to have patent stents that were placed in the past. Ext is not cool or blue.   Possibly 2/2 to arthritis, although septic athritis seem less likely because of lack of systemic signs of infection (fever/ leukocytosis). Doesn't appear to have fx given FROM, although complaining of R hip pain.  arterial duplex: right EIA stent patent. Right SFA known occlusion. Right calf runoff. On 03/10/13 - had highly calcified right SFA, stented right external iliac  artery. On Jan 2015 - stent of left external iliac artery with Dr. Alvester Chou.  DVT less likely given negative doppler on 09/02/13. Consider gouty arthritis as one of the differential given hx of gout. Is on allopurinol at home.  - admit to med surg - if clinical picture worsens, would have low threshold of starting abx treatment for cellulitis.  - will obtain xray of right hip, knee, ankle, and foot.  - norco/vicodin PRN for pain +  gabapentin - contacted wound center for records (given prolonged f/up with them for R foot wound) - was treated with 1 month of doxy.   PAD s/p stent on R and L Ext Iliac arteries -  Cont plavix, zocor, asa - Already has outpatient f/up with Dr. Alvester Chou for later this month.  HTN - elevated - likely 2/2 to pain and not taking his meds today - lisinopril was recently increased to BID by patient. - cont lisinopril 20 mg PO daily here for now. Might increase to 40 mg if needed.  DM II  - well controlled. Hgba1c 6.7 02/03/13.  - hold home glipizide for now.  - SSI sensitive and CBG monitoring.   PPx: protonix + heparin Code: DNR. Fully discussed with patient at the time of admission. He is fully aware of the decision and seems to have capacity to make his own decisions.   Diet: carb mod.  Dispo: Disposition is deferred at this time, awaiting improvement of current medical problems. Anticipated discharge in approximately 2-3 day(s).   The patient does have a current PCP Redmond Pulling Arna Medici, MD) and does need an [facility] hospital follow-up appointment after discharge.  The patient does not know have transportation limitations that hinder transportation to clinic appointments.  Signed: Dellia Nims, MD 09/06/2013, 3:00 PM

## 2013-09-06 NOTE — ED Provider Notes (Signed)
Medical screening examination/treatment/procedure(s) were conducted as a shared visit with non-physician practitioner(s) and myself.  I personally evaluated the patient during the encounter.  Please see separate associated note for evaluation and plan.    EKG Interpretation None       Orpah Greek, MD 09/06/13 [phone]

## 2013-09-06 NOTE — ED Notes (Signed)
Pt states he has been having ongoing pain x3 weeks that starts under his left shoulder and extends across his left breast.  Pt states the pain follows a linear path with a persistent burning sensation.  Pt chest accessed and no open wounds or visible lesions noted on assessment.  Pt skin is intact.    PA Dewitt Hoes made aware of pt complaint.

## 2013-09-06 NOTE — ED Provider Notes (Signed)
CSN: [id number]     Arrival date & time 09/06/13  3244 History   First MD Initiated Contact with Patient 09/06/13 0755     Chief Complaint  Patient presents with  . Leg Pain    right     (Consider location/radiation/quality/duration/timing/severity/associated sxs/prior Treatment) HPI Comments: Marc [name] is a 73 y.o. Male with a PMHx of DM2, HTN, HLD, Gout, PAD/PVD s/p R ext iliac artery stent, lumbar DDD s/p surgery, DJD of R knee s/p TKA, and stasis ulcers to RLE and sacrum treated with collagen therapies in 02/2013, presenting today with R groin pain and RLE erythema and swelling x1 week. States he has moderate, sharp, intermittent, nonradiating pain in R groin that worsens with flex/extend of hip in addition to RLE erythema and swelling which is chronic but has seemed to worsen over the last week. Had venous doppler last week which he endorses was negative. States he has numbness in BLEs at baseline so he cannot tell if his RLE is more painful or warm. States 2 weeks ago he had dental work done and was off his plavix x10 days, recently started it back up 4 days ago. Denies fevers, chills, CP, SOB, testicular swelling or pain, dysuria, hematuria, penile swelling or d/c,  acute back pain (chronic back pain present since surgery), incontinence of urine/stool, perianal tingling, acute LE weakness, abd pain, N/V/D/C, myalgias or arthralgias. Denies red streaking to RLE. Was given a "fluid pill" which did not help his RLE swelling, denies any known aggravating/alleviating factors. Takes all meds as directed.  Patient is a 73 y.o. male presenting with leg pain. The history is provided by the patient. No language interpreter was used.  Leg Pain Location:  Leg Time since incident:  1 week Injury: no   Leg location:  R leg Pain details:    Quality:  Sharp   Radiates to:  Does not radiate   Severity:  Moderate   Onset quality:  Gradual   Duration:  1 week   Timing:  Intermittent  Progression:  Unchanged Chronicity:  Recurrent Dislocation: no   Prior injury to area:  No Relieved by:  None tried Worsened by:  Extension and flexion Ineffective treatments:  None tried Associated symptoms: back pain (chronic, no acute changes), numbness (chronic) and swelling (RLE swelling ongoing x6 mos)   Associated symptoms: no decreased ROM, no fever, no muscle weakness, no neck pain, no stiffness and no tingling     Past Medical History  Diagnosis Date  . Hypertension   . Ulcer disease   . Colon polyp   . Gout   . Bradycardia   . Hypercholesteremia   . PAD (peripheral artery disease)   . Chronic bronchitis     "used to be very susceptible when I was a kid & smoking" (02/04/2012)  . Type II diabetes mellitus   . History of blood transfusion ~ 2007    "10 of them when I had stomach bleeding"  . Bleeding stomach ulcer 2007  . Arthritis     "qwhere that you can get it; mostly in my shoulders" (02/04/2012)  . Gout   . S/P angioplasty with stent, to distal RT ext. iliac artery 03/10/2012  . Tobacco abuse disorder 03/10/2012  . GERD (gastroesophageal reflux disease)   . Muscle spasms of neck     has spasms over body when get up from sitting  . Pneumonia    Past Surgical History  Procedure Laterality Date  . Splenectomy  ~  1957  . Anterior cervical decomp/discectomy fusion  ~ 2007  . Upper endoscopy w/ sclerotherapy  ~ 2007  . Polypectomy    . Colonoscopy    . Iliac artery stent      left common/notes (02/04/2012)  . Hernia repair  ~ 2007    UHR (02/04/2012)  . Tonsillectomy  ~ 1947  . Sfa    . Sfa  03/10/2012    ILLEAC STENOSIS  . Back surgery  11/14  . Total knee arthroplasty Right 02/03/2013    Procedure: TOTAL KNEE ARTHROPLASTY;  Surgeon: [name], MD;  Location: [place];  Service: Orthopedics;  Laterality: Right;   Family History  Problem Relation Age of Onset  . Colon cancer Paternal Uncle     Uncle  . Heart disease Father   . Heart disease Mother    History   Substance Use Topics  . Smoking status: Current Every Day Smoker -- 2.00 packs/day for 58 years    Types: Cigarettes  . Smokeless tobacco: Former Systems developer    Types: Chew     Comment: occ alcohol  . 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  Respiratory: Negative for cough and shortness of breath.   Cardiovascular: Positive for leg swelling. Negative for chest pain and palpitations.  Gastrointestinal: Negative for nausea, vomiting, abdominal pain, diarrhea and constipation.  Genitourinary: Negative for dysuria, hematuria, discharge, penile swelling, scrotal swelling, penile pain and testicular pain.  Musculoskeletal: Positive for back pain (chronic, no acute changes). Negative for arthralgias, joint swelling, myalgias, neck pain, neck stiffness and stiffness.       +RLE pain/swelling  Skin: Positive for color change (RLE erythema) and wound (healing RLE ulcers).  Neurological: Positive for numbness (at baseline). Negative for dizziness, syncope, weakness, light-headedness and headaches.  Hematological: Bruises/bleeds easily (on plavix).  Psychiatric/Behavioral: Negative for confusion.  10 Systems reviewed and are negative for acute change except as noted in the HPI.     Allergies  Review of patient's allergies indicates no known allergies.  Home Medications   Prior to Admission medications   Medication Sig Start Date End Date Taking? Authorizing Provider  allopurinol (ZYLOPRIM) 300 MG tablet Take 300 mg by mouth daily.   Yes Historical Provider, MD  aspirin EC 325 MG tablet Take 1 tablet (325 mg total) by mouth 2 (two) times daily. 02/08/13  Yes Leighton Parody, PA-C  atorvastatin (LIPITOR) 10 MG tablet Take 10 mg by mouth daily. 07/21/13  Yes Historical Provider, MD  clopidogrel (PLAVIX) 75 MG tablet  Take 1 tablet (75 mg total) by mouth daily. <please schedule appointment for future refills>   Yes Lorretta Harp, MD  furosemide (LASIX) 80 MG tablet Take 40 mg by mouth daily. 09/02/13  Yes Historical Provider, MD  gabapentin (NEURONTIN) 600 MG tablet Take 1,200 mg by mouth every 8 (eight) hours.    Yes Historical Provider, MD  glimepiride (AMARYL) 4 MG tablet Take 4 mg by mouth 2 (two) times daily.    Yes Historical Provider, MD  HYDROcodone-acetaminophen (NORCO/VICODIN) 5-325 MG per tablet Take 2 tablets by mouth every 8 (eight) hours as needed for moderate pain.    Yes Historical Provider, MD  lisinopril (PRINIVIL,ZESTRIL) 20 MG tablet Take 20 mg by  mouth daily.     Yes Historical Provider, MD  lovastatin (MEVACOR) 40 MG tablet Take 40 mg by mouth at bedtime.   Yes Historical Provider, MD  omeprazole (PRILOSEC OTC) 20 MG tablet Take 40 mg by mouth daily.    Yes Historical Provider, MD  predniSONE (DELTASONE) 10 MG tablet Take 10 mg by mouth daily with breakfast.   Yes Historical Provider, MD   BP 153/59  Pulse 56  Temp(Src) 98.2 F (36.8 C) (Oral)  Resp 17  Ht 6\' 1"  (1.854 m)  Wt 204 lb (92.534 kg)  BMI 26.92 kg/m2  SpO2 98% Physical Exam  Nursing note and vitals reviewed. Constitutional: He is oriented to person, place, and time. Vital signs are normal. He appears well-developed and well-nourished. No distress.  Afebrile, NAD, VSS  HENT:  Head: Normocephalic and atraumatic.  Mouth/Throat: Mucous membranes are normal.  Eyes: Conjunctivae and EOM are normal. Pupils are equal, round, and reactive to light. Right eye exhibits no discharge. Left eye exhibits no discharge.  Neck: Normal range of motion. Neck supple.  Cardiovascular: Normal rate, regular rhythm and normal heart sounds.  Exam reveals no gallop and no friction rub.   No murmur heard. Pulses:      Radial pulses are 2+ on the right side, and 2+ on the left side.       Dorsalis pedis pulses are 1+ on the right side, and 1+  on the left side.       Posterior tibial pulses are 1+ on the right side, and 1+ on the left side.  RRR, nl s1/s2, no m/r/g. Distal BLE pulses nonpalpable and difficult to detect with doppler, but BLEs warm to touch and without cyanosis. Pt able to wiggle toes  Pulmonary/Chest: Effort normal and breath sounds normal. No respiratory distress. He has no wheezes. He has no rhonchi. He has no rales.  Abdominal: Soft. Normal appearance and bowel sounds are normal. He exhibits no distension. There is no tenderness. There is no rigidity, no rebound, no guarding and no CVA tenderness.  Genitourinary:     Tenderness to R groin, as noted in picture above, without any swelling or masses or erythema.  Musculoskeletal: Normal range of motion.       Right lower leg: He exhibits edema. He exhibits no tenderness, no bony tenderness and no swelling.       Legs: Strength 5/5 in all extremities. Baseline ROM in b/l hips, knees, and ankles. No TTP in any joints of BLEs. All spinal levels without TTP along spinous processes and paraspinous muscles and without spasm, no deformity or crepitus. Midline lumbar scar noted, well healed. Neg SLR bilaterally. Sensation diminished in BLEs, which is chronic per pt. DP/PT pulses diminished and difficult to obtain with doppler, but wiggles toes well and cap refill adequate. BLEs warm to touch, with RLE with increased warmth to touch and 2+ pretibial edema and mild erythema. Well healed ulcers to medial malleolus and heel of RLE.  Lymphadenopathy:       Right: No inguinal adenopathy present.       Left: No inguinal adenopathy present.  Neurological: He is alert and oriented to person, place, and time. He has normal strength. A sensory deficit is present.  Baseline strength in all extremities. Baseline sensation, which is diminished chronically per pt.  Skin: Skin is warm and dry. There is erythema.  Erythema to mid-calf of RLE, well healed ulcers as noted above. 2+ pretibial  edema in RLE, 1+ in LLE.  RLE warm to touch  Psychiatric: He has a normal mood and affect.    ED Course  Procedures (including critical care time) Labs Review Labs Reviewed  BASIC METABOLIC PANEL - Abnormal; Notable for the following:    Glucose, Bld 206 (*)    GFR calc non Af Amer 51 (*)    GFR calc Af Amer 59 (*)    All other components within normal limits  CBC WITH DIFFERENTIAL    Imaging Review No results found.  Guinevere Ferrari, RVT, Duplex arterial study and ABI: ARTERIAL  ABI completed:   RIGHT    LEFT     PRESSURE  WAVEFORM   PRESSURE  WAVEFORM   BRACHIAL  172  triphasic  BRACHIAL  180  triphasic   DP    DP     AT  73  Dampened monophasic  AT  99  Dampened monophasic   PT  82  Dampened monophasic  PT  98  Dampened monophasic   PER    PER     GREAT TOE   NA  GREAT TOE   NA     RIGHT  LEFT   ABI  0.46  0.55   Duplex imaging: Right: 50-99% stenosis in the mid femoral artery. External iliac stent appears patent. Left: Proximal Femoral artery appears occluded with reconstitution in the popliteal artery.   EKG Interpretation None      MDM   Final diagnoses:  Claudication in peripheral vascular disease  Hyperlipidemia  S/P angioplasty with stent  Tobacco abuse disorder    72y/o male with multiple diseases and vascular conditions, here with R groin pain and RLE swelling/erythema. Will obtain basic labs and get artery doppler study as well as ABIs. Will reassess shortly. DVT study on 8/6 negative, doubt DVT. Likely RLE cellulitis given chronic vascular disease and RLE swelling/erythema.   11:08 AM ABI with results showing moderate to severe blockage consistent with his hx. Imaging showing 50-99% stenosis at mid-femoral artery but with ext iliac stent patent. Groin pain could be related to arterial disease or could be neuropathic pain, which pt states is chronic. Discussed case with Dr. Malachy Moan, MD, who discussed plan for tx of cellulitis as outpatient, and pt was  very hesitant, stating that the hospital "mismanaged him" months ago and caused him to have all these issues with sores and wounds. Given that he has recent TKA with concern for RLE cellulitis, will consult hospitalist for admission, but explained to the pt that this might not be accepted and would proceed with tx for cellulitis of RLE with keflex/bactrim and have pt f/up with cardiologist regarding his PAD.   12:09 PM Internal med consulted, and will admit pt. Please see Dr. Rosalene Billings dictation regarding admission.   BP 158/70  Pulse 63  Temp(Src) 98.2 F (36.8 C) (Oral)  Resp 15  Ht 6\' 1"  (1.854 m)  Wt 204 lb (92.534 kg)  BMI 26.92 kg/m2  SpO2 97%   [employer], PA-C 09/06/13 1210

## 2013-09-06 NOTE — Telephone Encounter (Signed)
Dr Gwenlyn Found spoke with Dr Lanae Crumbly.  Per Dr Gwenlyn Found, Mr Rathgeber needs lower extremity arterial dopplers prior to appt on 8/24 with Brittainy.

## 2013-09-06 NOTE — Progress Notes (Signed)
VASCULAR LAB PRELIMINARY  ARTERIAL  ABI completed:    RIGHT    LEFT    PRESSURE WAVEFORM  PRESSURE WAVEFORM  BRACHIAL 172 triphasic BRACHIAL 180 triphasic  DP   DP    AT 73 Dampened monophasic AT 99 Dampened monophasic  PT 82 Dampened monophasic PT 98 Dampened monophasic  PER   PER    GREAT TOE  NA GREAT TOE  NA    RIGHT LEFT  ABI 0.46 0.55   Duplex imaging:  Right:  50-99% stenosis in the mid femoral artery.  External iliac stent appears patent.  Left:  Proximal Femoral artery appears occluded with reconstitution in the popliteal artery.  Marc [name], RVT 09/06/2013, 10:08 AM

## 2013-09-06 NOTE — Telephone Encounter (Signed)
Needs to speak with Dr. Gwenlyn Found about Mr. Rodriquez

## 2013-09-06 NOTE — ED Notes (Signed)
Pt coming from home with c/o of right leg pain (starting in the groin down to the right foot) that has been ongoing x 1 week.  The right foot is swollen and red.

## 2013-09-06 NOTE — ED Provider Notes (Signed)
Patient presented to the ER with right leg pain, swelling and redness. Patient reports that he has noticed swelling and redness of the right foot and lower leg over the last several days. Patient reports increasing pain in the region. 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 He had postoperative complications from a nonhealing heel ulcer which required skin grafting. Patient reports that he was on doxycycline for approximately one month, was just released from the wound center several weeks ago.  Face to face Exam: HEENT - PERRLA Lungs - CTAB Heart - RRR, no M/R/G Abd - S/NT/ND Neuro - alert, oriented x3 Musculoskeletal - diffuse edema of right lower leg from mid shin down to toes. Overlying erythema and warmth.  Plan: Patient had venous duplex 5 days ago for these symptoms and was negative for DVT. Patient does have a history of peripheral vascular disease with stenting performed by cardiology for claudication. ABI and arterial duplex performed today. This does not appear to have an acute blockage or ischemia, but he does appear to have a cellulitis. The patient's recent total knee replacement is of concern with a cellulitis in the area, as the prosthesis could become infected. Patient also has skin grafting an area that can be affected by infection. Additionally, he has decreased blood flow to the region, decreased ability to perfuse and clear this infection. He was recently on one month of doxycycline, finished 2 weeks ago. Because of these reasons, will consult medicine for admission.   Orpah Greek, MD 09/06/13 1155

## 2013-09-07 ENCOUNTER — Telehealth (HOSPITAL_COMMUNITY): Payer: Self-pay | Admitting: *Deleted

## 2013-09-07 DIAGNOSIS — I10 Essential (primary) hypertension: Secondary | ICD-10-CM

## 2013-09-07 DIAGNOSIS — M7989 Other specified soft tissue disorders: Secondary | ICD-10-CM

## 2013-09-07 DIAGNOSIS — E119 Type 2 diabetes mellitus without complications: Secondary | ICD-10-CM

## 2013-09-07 DIAGNOSIS — I7789 Other specified disorders of arteries and arterioles: Secondary | ICD-10-CM

## 2013-09-07 DIAGNOSIS — M25569 Pain in unspecified knee: Secondary | ICD-10-CM

## 2013-09-07 DIAGNOSIS — M25559 Pain in unspecified hip: Secondary | ICD-10-CM

## 2013-09-07 LAB — GLUCOSE, CAPILLARY
GLUCOSE-CAPILLARY: 137 mg/dL — AB (ref 70–99)
GLUCOSE-CAPILLARY: 164 mg/dL — AB (ref 70–99)
Glucose-Capillary: 111 mg/dL — ABNORMAL HIGH (ref 70–99)
Glucose-Capillary: 150 mg/dL — ABNORMAL HIGH (ref 70–99)

## 2013-09-07 MED ORDER — KETOROLAC TROMETHAMINE 15 MG/ML IJ SOLN
15.0000 mg | Freq: Once | INTRAMUSCULAR | Status: AC
  Administered 2013-09-07: 15 mg via INTRAVENOUS
  Filled 2013-09-07: qty 1

## 2013-09-07 MED ORDER — HYDROCODONE-ACETAMINOPHEN 5-325 MG PO TABS
1.0000 | ORAL_TABLET | Freq: Four times a day (QID) | ORAL | Status: DC | PRN
  Administered 2013-09-07 – 2013-09-08 (×3): 2 via ORAL
  Filled 2013-09-07 (×3): qty 2

## 2013-09-07 MED ORDER — CYCLOBENZAPRINE HCL 10 MG PO TABS
10.0000 mg | ORAL_TABLET | Freq: Three times a day (TID) | ORAL | Status: DC
  Administered 2013-09-07 – 2013-09-08 (×3): 10 mg via ORAL
  Filled 2013-09-07 (×5): qty 1

## 2013-09-07 MED ORDER — KETOROLAC TROMETHAMINE 30 MG/ML IJ SOLN
30.0000 mg | Freq: Once | INTRAMUSCULAR | Status: DC
Start: 2013-09-07 — End: 2013-09-07

## 2013-09-07 NOTE — Progress Notes (Signed)
UR Completed.  Taurus Willis Jane [phone] 09/07/2013  

## 2013-09-07 NOTE — H&P (Signed)
  Date: 09/07/2013  Patient name: Marc [name]  Medical record number: [id number]  Date of birth: [birth year]   This patient has been seen and the plan of care was discussed with the house staff. Please see their note for complete details. I concur with their findings with the following additions/corrections:  Interesting picture of leg/hip pain and foot swelling. Patient reports 1 week history of leg pain as well as foot swelling. He denies his feet causing him pain. Predominantly notices having pain that radiates from hip down to leg as well as originating from knee and radiating up to hip. He denies any recent trauma. Admitted for evaluation. Physical exam does show some swelling to dorsum of foot of right leg, healing ulcer, no significant signs consistent with cellulitis. He does have pain when raising his leg above bed. He is able to do hip flexion/ external rotation/internal rotation without much difficulty or pain. His quad anterior muscle group does appear tight during exam. Concern for quadricep tendon shortening/tendonitis vs atypical sciatica. Will get hip and femur mri. For symptom relief, we will try a dose of toradol as well as muscle relaxant to see if it improves his pain. Low likelihood for cellulitis to dorsum of right foot. Will watch for changes to see if need to add antibiotics  Carlyle Basques, MD 09/07/2013, 4:06 PM

## 2013-09-07 NOTE — Progress Notes (Addendum)
Subjective:  Has right sided groin pain and knee/hip pain. Also has swelling on right foot without tenderness. Feels good otherwise. No cp/sob/ha/n/v.   Objective: Vital signs in last 24 hours: Filed Vitals:   09/06/13 1231 09/06/13 1358 09/06/13 2253 09/07/13 0646  BP: 166/90 132/79 151/85 159/74  Pulse: 74 74 74 52  Temp:  97.4 F (36.3 C) 98.1 F (36.7 C) 97.6 F (36.4 C)  TempSrc:  Oral Oral Oral  Resp: 13 18 18 17   Height:  6\' 1"  (1.854 m)    Weight:  92.534 kg (204 lb)    SpO2: 99% 99% 97% 97%   Weight change:   Intake/Output Summary (Last 24 hours) at 09/07/13 0936 Last data filed at 09/07/13 0647  Gross per 24 hour  Intake    700 ml  Output   1075 ml  Net   -375 ml    Vitals reviewed. General: resting in bed, NAD HEENT: PERRL, EOMI, no scleral icterus Cardiac: RRR, no rubs, murmurs or gallops Pulm: clear to auscultation bilaterally, no wheezes, rales, or rhonchi Abd: soft, nontender, nondistended, BS present Ext:   Right foot non tender, nonerythematous. Has swelling on dorsal surface. There are 2 skin lesions around the ankle joints medially and laterally, both well healed without discharge.    Neuro: alert and oriented X3, cranial nerves II-XII grossly intact, strength and sensation to light touch equal in bilateral upper and lower extremities  Lab Results: Basic Metabolic Panel:  Recent Labs Lab 09/06/13 0843  NA 145  K 4.8  CL 105  CO2 28  GLUCOSE 206*  BUN 23  CREATININE 1.34  CALCIUM 9.4   Liver Function Tests: No results found for this basename: AST, ALT, ALKPHOS, BILITOT, PROT, ALBUMIN,  in the last 168 hours No results found for this basename: LIPASE, AMYLASE,  in the last 168 hours No results found for this basename: AMMONIA,  in the last 168 hours CBC:  Recent Labs Lab 09/06/13 0843  WBC 6.9  NEUTROABS 4.5  HGB 14.1  HCT 43.0  MCV 96.4  PLT 201   Cardiac Enzymes: No results found for this basename: CKTOTAL, CKMB,  CKMBINDEX, TROPONINI,  in the last 168 hours BNP: No results found for this basename: PROBNP,  in the last 168 hours D-Dimer: No results found for this basename: DDIMER,  in the last 168 hours CBG:  Recent Labs Lab 09/06/13 1328 09/06/13 1733 09/06/13 2245 09/07/13 0735  GLUCAP 111* 172* 80 137*   Micro Results: No results found for this or any previous visit (from the past 240 hour(s)). Studies/Results: Dg Hip Complete Right  09/06/2013   CLINICAL DATA:  Right lower extremity pain across all joints and foot 2 weeks.  EXAM: RIGHT HIP - COMPLETE 2+ VIEW  COMPARISON:  None.  FINDINGS: There are mild symmetric degenerative changes of the hips. There is no acute fracture or dislocation. There are mild degenerative changes over the symphysis pubis joint. Fusion hardware is present over the lower lumbar spine. Vascular stents are seen over the pelvis bilaterally. There are several left pelvic phleboliths.  IMPRESSION: No acute findings.   Electronically Signed   By: Marin Olp M.D.   On: 09/06/2013 16:55   Dg Ankle Complete Right  09/06/2013   CLINICAL DATA:  73 year old male with pain radiating to the right lower extremity and increasing. Initial encounter.  EXAM: RIGHT ANKLE - COMPLETE 3+ VIEW  COMPARISON:  Right foot series from the same day reported separately.  FINDINGS:  Extensive chronic appearing irregular calcification about the medial and lateral malleoli. Mortise joint alignment preserved. Talar dome appears intact. No joint effusion. Calcaneus intact with degenerative spurring. No acute fracture or dislocation. Calcified atherosclerosis.  IMPRESSION: Extensive chronic changes about the medial and lateral malleoli. No acute osseous abnormality identified at the right ankle.   Electronically Signed   By: Lars Pinks M.D.   On: 09/06/2013 16:57   Dg Knee Complete 4 Views Right  09/06/2013   CLINICAL DATA:  Lower leg pain, prior knee replacement  EXAM: RIGHT KNEE - COMPLETE 4+ VIEW   COMPARISON:  02/06/2013  FINDINGS: Status post right knee arthroplasty. Components appear aligned. No acute osseous or hardware abnormality. No malalignment, fracture or joint effusion. Peripheral atherosclerosis noted.  IMPRESSION: Status post right knee arthroplasty. No acute finding by plain radiography.   Electronically Signed   By: Daryll Brod M.D.   On: 09/06/2013 16:53   Dg Foot Complete Right  09/06/2013   CLINICAL DATA:  73 year old male with pain radiating to the right lower extremity and increasing. Initial encounter.  EXAM: RIGHT FOOT COMPLETE - 3+ VIEW  COMPARISON:  None.  FINDINGS: MTP joint subluxation. Bone mineralization is within normal limits for age. Calcaneus intact with degenerative spurring. Vascular calcifications in the right foot. No acute fracture or dislocation identified.  IMPRESSION: No acute fracture or dislocation identified about the right foot.   Electronically Signed   By: Lars Pinks M.D.   On: 09/06/2013 16:54   Medications: I have reviewed the patient's current medications. Scheduled Meds: . allopurinol  300 mg Oral Daily  . aspirin EC  81 mg Oral Daily  . clopidogrel  75 mg Oral Daily  . gabapentin  1,200 mg Oral 3 times per day  . heparin  5,000 Units Subcutaneous 3 times per day  . insulin aspart  0-9 Units Subcutaneous TID WC  . lisinopril  20 mg Oral Daily  . pantoprazole  40 mg Oral Daily  . simvastatin  20 mg Oral q1800   Continuous Infusions:  PRN Meds:.HYDROcodone-acetaminophen Assessment/Plan: Principal Problem:   Right leg swelling Active Problems:   Diabetes mellitus   Hypertension   PVD- s/p  PTA and stenting of left common/external iliac artery 1/7   Claudication in peripheral vascular disease, life style limiting   S/P angioplasty with stent, to distal RT ext. iliac artery   Tobacco abuse disorder   Spinal stenosis of lumbar region   Hyperlipidemia   Cellulitis   PAD (peripheral artery disease)  Right knee/hip pain -has  tenderness and tightness on the quad tendons. possibly short quad syndrome vs tendonitis. -  will get MRI of femur/hip. PT eval for quad stretching exercise. Added flexaril TID for pain and toradol 1x.  - ordered PT to do quad stretching or other therapies that can help loosen the tightness.  Right foot swelling  unclear etiology. Low suspicion of cellulitis since he has no systemic signs of infection, there is no obvious local signs of infection. R foot slightly warm and more edematous than left. However, given his severe PAD and lack of tenderness of the foot the picture is complicated. It points to other possible causes. He has worsening arterial dz appears to have patent stents that were placed in the past. Ext is not cool or blue.  Possibly 2/2 to arthritis, although septic athritis seem less likely because of lack of systemic signs of infection (fever/ leukocytosis). Doesn't appear to have fx given FROM, although complaining of R  hip pain.  arterial duplex: right EIA stent patent. Right SFA known occlusion. Right calf runoff. On 03/10/13 - had highly calcified right SFA, stented right external iliac artery. On Jan 2015 - stent of left external iliac artery with Dr. Alvester Chou.  DVT less likely given negative doppler on 09/02/13. Consider gouty arthritis as one of the differential given hx of gout. Is on allopurinol at home.  - if clinical picture worsens, would have low threshold of starting abx treatment for cellulitis.  - xray of right hip, knee, ankle, and foot all normal except for calcifications.  - norco/vicodin PRN + gabapentin + flexaril TID for pain.  PAD s/p stent on R and L Ext Iliac arteries  - Cont plavix, zocor, asa  - arterial doppler shows patent stents and at baseline. - Already has outpatient f/up with Dr. Alvester Chou for later this month.   HTN - elevated  - likely 2/2 to pain and not taking his meds today  - lisinopril was recently increased to BID by patient.  - cont lisinopril 20  mg PO daily here for now. Might increase to 40 mg if needed.   DM II  - well controlled. Hgba1c 6.7 02/03/13.  - hold home glipizide for now.  - SSI sensitive and CBG monitoring.   PPx: protonix + heparin  Code: DNR. Fully discussed with patient at the time of admission. He is fully aware of the decision and seems to have capacity to make his own decisions.  Diet: carb mod.  Dispo: Disposition is deferred at this time, awaiting improvement of current medical problems.  Anticipated discharge in approximately 2-3 day(s).   The patient does have a current PCP Redmond Pulling Arna Medici, MD) and does need an [facility] hospital follow-up appointment after discharge.  The patient does not know have transportation limitations that hinder transportation to clinic appointments.  .Services Needed at time of discharge: Y = Yes, Blank = No PT:   OT:   RN:   Equipment:   Other:     LOS: 1 day   Dellia Nims, MD 09/07/2013, 9:36 AM

## 2013-09-08 ENCOUNTER — Observation Stay (HOSPITAL_COMMUNITY): Payer: Medicare Other

## 2013-09-08 DIAGNOSIS — K6812 Psoas muscle abscess: Secondary | ICD-10-CM | POA: Diagnosis present

## 2013-09-08 LAB — GLUCOSE, CAPILLARY
GLUCOSE-CAPILLARY: 98 mg/dL (ref 70–99)
Glucose-Capillary: 117 mg/dL — ABNORMAL HIGH (ref 70–99)
Glucose-Capillary: 173 mg/dL — ABNORMAL HIGH (ref 70–99)
Glucose-Capillary: 162 mg/dL — ABNORMAL HIGH (ref 70–99)

## 2013-09-08 LAB — PROTIME-INR
INR: 1.02 (ref 0.00–1.49)
Prothrombin Time: 13.4 seconds (ref 11.6–15.2)

## 2013-09-08 LAB — SEDIMENTATION RATE: Sed Rate: 20 mm/hr — ABNORMAL HIGH (ref 0–16)

## 2013-09-08 LAB — C-REACTIVE PROTEIN: CRP: <0.5 mg/dL — ABNORMAL LOW (ref <0.60)

## 2013-09-08 LAB — APTT: aPTT: 36 seconds (ref 24–37)

## 2013-09-08 MED ORDER — CLOPIDOGREL BISULFATE 75 MG PO TABS
75.0000 mg | ORAL_TABLET | Freq: Every day | ORAL | Status: DC
  Administered 2013-09-10: 75 mg via ORAL
  Filled 2013-09-08: qty 1

## 2013-09-08 MED ORDER — OXYCODONE-ACETAMINOPHEN 5-325 MG PO TABS
1.0000 | ORAL_TABLET | Freq: Four times a day (QID) | ORAL | Status: DC | PRN
  Administered 2013-09-08 – 2013-09-09 (×3): 2 via ORAL
  Filled 2013-09-08 (×3): qty 2

## 2013-09-08 MED ORDER — CLOPIDOGREL BISULFATE 75 MG PO TABS
75.0000 mg | ORAL_TABLET | Freq: Every day | ORAL | Status: DC
Start: 2013-09-09 — End: 2013-09-08

## 2013-09-08 MED ORDER — GADOBENATE DIMEGLUMINE 529 MG/ML IV SOLN
20.0000 mL | Freq: Once | INTRAVENOUS | Status: AC
  Administered 2013-09-08: 20 mL via INTRAVENOUS

## 2013-09-08 NOTE — H&P (Signed)
Agree.  Imaging reviewed.  Small fluid component by MRI.  Will attempt CT guided aspiration tomorrow for diagnostic purposes.

## 2013-09-08 NOTE — Progress Notes (Addendum)
Subjective:  Continues to have right sided groin pain and knee/hip pain. Also has swelling on right foot without tenderness. No changes otherwise. No cp/sob/ha/n/v.   Objective: Vital signs in last 24 hours: Filed Vitals:   09/07/13 1416 09/07/13 2210 09/08/13 0616 09/08/13 1205  BP: 130/61 154/55 158/52 155/56  Pulse: 80 52 52 48  Temp: 97.9 F (36.6 C) 97.7 F (36.5 C) 97.6 F (36.4 C) 97.8 F (36.6 C)  TempSrc: Oral Oral Oral Oral  Resp: 18 18  17   Height:      Weight:      SpO2: 97% 96% 97% 99%   Weight change:   Intake/Output Summary (Last 24 hours) at 09/08/13 1452 Last data filed at 09/08/13 0641  Gross per 24 hour  Intake    120 ml  Output    400 ml  Net   -280 ml    Vitals reviewed. General: resting in bed, NAD HEENT: PERRL, EOMI, no scleral icterus Cardiac: RRR, no rubs, murmurs or gallops Pulm: clear to auscultation bilaterally, no wheezes, rales, or rhonchi Abd: soft, nontender, nondistended, BS present Ext:   Right foot non tender, nonerythematous. Has swelling on dorsal surface but decreased with the compression socks. There are 2 skin lesions around the ankle joints medially and laterally, both well healed without discharge.   Right knee TTP anterior suprapatellar region.  Neuro: alert and oriented X3, cranial nerves II-XII grossly intact, strength and sensation to light touch equal in bilateral upper and lower extremities  Lab Results: Basic Metabolic Panel:  Recent Labs Lab 09/06/13 0843  NA 145  K 4.8  CL 105  CO2 28  GLUCOSE 206*  BUN 23  CREATININE 1.34  CALCIUM 9.4   Liver Function Tests: No results found for this basename: AST, ALT, ALKPHOS, BILITOT, PROT, ALBUMIN,  in the last 168 hours No results found for this basename: LIPASE, AMYLASE,  in the last 168 hours No results found for this basename: AMMONIA,  in the last 168 hours CBC:  Recent Labs Lab 09/06/13 0843  WBC 6.9  NEUTROABS 4.5  HGB 14.1  HCT 43.0  MCV 96.4    PLT 201   Cardiac Enzymes: No results found for this basename: CKTOTAL, CKMB, CKMBINDEX, TROPONINI,  in the last 168 hours BNP: No results found for this basename: PROBNP,  in the last 168 hours D-Dimer: No results found for this basename: DDIMER,  in the last 168 hours CBG:  Recent Labs Lab 09/07/13 0735 09/07/13 1202 09/07/13 1715 09/07/13 2213 09/08/13 0928 09/08/13 1202  GLUCAP 137* 111* 150* 164* 117* 173*   Micro Results: No results found for this or any previous visit (from the past 240 hour(s)). Studies/Results: Dg Hip Complete Right  09/06/2013   CLINICAL DATA:  Right lower extremity pain across all joints and foot 2 weeks.  EXAM: RIGHT HIP - COMPLETE 2+ VIEW  COMPARISON:  None.  FINDINGS: There are mild symmetric degenerative changes of the hips. There is no acute fracture or dislocation. There are mild degenerative changes over the symphysis pubis joint. Fusion hardware is present over the lower lumbar spine. Vascular stents are seen over the pelvis bilaterally. There are several left pelvic phleboliths.  IMPRESSION: No acute findings.   Electronically Signed   By: Marin Olp M.D.   On: 09/06/2013 16:55   Dg Ankle Complete Right  09/06/2013   CLINICAL DATA:  73 year old male with pain radiating to the right lower extremity and increasing. Initial encounter.  EXAM: RIGHT  ANKLE - COMPLETE 3+ VIEW  COMPARISON:  Right foot series from the same day reported separately.  FINDINGS: Extensive chronic appearing irregular calcification about the medial and lateral malleoli. Mortise joint alignment preserved. Talar dome appears intact. No joint effusion. Calcaneus intact with degenerative spurring. No acute fracture or dislocation. Calcified atherosclerosis.  IMPRESSION: Extensive chronic changes about the medial and lateral malleoli. No acute osseous abnormality identified at the right ankle.   Electronically Signed   By: Lars Pinks M.D.   On: 09/06/2013 16:57   Mr Hip Right W Wo  Contrast  09/08/2013   CLINICAL DATA:  Right hip and leg pain.  EXAM: MRI OF THE RIGHT FEMUR WITHOUT AND WITH CONTRAST; MRI OF THE RIGHT HIP WITHOUT AND WITH CONTRAST  TECHNIQUE: Multiplanar, multisequence MR imaging of the lower right extremity was performed both before and after administration of intravenous contrast.; Multiplanar, multisequence MR imaging was performed both before and after administration of intravenous contrast.  CONTRAST:  77m MULTIHANCE GADOBENATE DIMEGLUMINE 529 MG/ML IV SOLN  COMPARISON:  08/28/2013  FINDINGS: RIGHT HIP MRI:  Abnormal edema in the right iliopsoas muscle, images 6 through 39 of series 4, extending to the iliopsoas insertion site on the lesser trochanter. There is an associated abnormal fluid collection in the distal iliopsoas with mildly irregular enhancing margins, fluid collection measuring 2.6 x 5.3 by 1.6 cm. This is visible on image 27 of series 19.  A small amount of edema tracks within and along the right vastus intermedius and vastus lateralis muscles anteriorly as shown on image 37 of series 4, and there is also low-grade edema tracking along the right adductor minimus inferiorly, image 38 series 4.  Proximal hamstring tendinopathy. Pubic bodies and adjacent aponeurotic attachments intact. No significant hip joint effusion or abnormal regional osseous edema. No sciatic notch impingement or sciatic nerve asymmetry.  RIGHT UPPER LEG MRI:  Right total knee prosthesis obscures findings in the vicinity of the rib right knee.  In addition to the above-described findings, there is abnormal edema and enhancement tracking along the anterior superficial fascia margins of the anterior compartment of the thigh , particularly along the anterior margins of the rectus femoris and vastus lateralis as shown on image 22 of series 14. There is likely some faint edema within the rectus femoris and vastus lateralis muscles anteriorly. No additional drainable fluid collection is  identified.  There is some patchy asymmetric marrow edema in the distal diaphysis and distal metaphysis of the right femur shown on images 17-18 of series 12 and image 16 of series 21. My impression on the axial images is that there is some associated contrast enhancement. No radiographic abnormality was visible in this vicinity on 09/06/2013.  IMPRESSION: 1. Small but irregular right iliopsoas fluid collection with enhancing margins and surrounding myositis. Appearance compatible with right iliopsoas distal abscess. Mild myositis involving the right vastus intermedius, rectus femoris vastus lateralis, and right adductor minimus muscles. Potential low-grade fasciitis along these muscle margins. 2. Abnormal patchy marrow edema and enhancement distally in the femoral diaphysis and metaphysis, suspicious for early osteomyelitis.   Electronically Signed   By: WSherryl BartersM.D.   On: 09/08/2013 09:36   Mr Femur Right W Wo Contrast  09/08/2013   CLINICAL DATA:  Right hip and leg pain.  EXAM: MRI OF THE RIGHT FEMUR WITHOUT AND WITH CONTRAST; MRI OF THE RIGHT HIP WITHOUT AND WITH CONTRAST  TECHNIQUE: Multiplanar, multisequence MR imaging of the lower right extremity was performed both before and  after administration of intravenous contrast.; Multiplanar, multisequence MR imaging was performed both before and after administration of intravenous contrast.  CONTRAST:  2m MULTIHANCE GADOBENATE DIMEGLUMINE 529 MG/ML IV SOLN  COMPARISON:  08/28/2013  FINDINGS: RIGHT HIP MRI:  Abnormal edema in the right iliopsoas muscle, images 6 through 39 of series 4, extending to the iliopsoas insertion site on the lesser trochanter. There is an associated abnormal fluid collection in the distal iliopsoas with mildly irregular enhancing margins, fluid collection measuring 2.6 x 5.3 by 1.6 cm. This is visible on image 27 of series 19.  A small amount of edema tracks within and along the right vastus intermedius and vastus lateralis  muscles anteriorly as shown on image 37 of series 4, and there is also low-grade edema tracking along the right adductor minimus inferiorly, image 38 series 4.  Proximal hamstring tendinopathy. Pubic bodies and adjacent aponeurotic attachments intact. No significant hip joint effusion or abnormal regional osseous edema. No sciatic notch impingement or sciatic nerve asymmetry.  RIGHT UPPER LEG MRI:  Right total knee prosthesis obscures findings in the vicinity of the rib right knee.  In addition to the above-described findings, there is abnormal edema and enhancement tracking along the anterior superficial fascia margins of the anterior compartment of the thigh , particularly along the anterior margins of the rectus femoris and vastus lateralis as shown on image 22 of series 14. There is likely some faint edema within the rectus femoris and vastus lateralis muscles anteriorly. No additional drainable fluid collection is identified.  There is some patchy asymmetric marrow edema in the distal diaphysis and distal metaphysis of the right femur shown on images 17-18 of series 12 and image 16 of series 21. My impression on the axial images is that there is some associated contrast enhancement. No radiographic abnormality was visible in this vicinity on 09/06/2013.  IMPRESSION: 1. Small but irregular right iliopsoas fluid collection with enhancing margins and surrounding myositis. Appearance compatible with right iliopsoas distal abscess. Mild myositis involving the right vastus intermedius, rectus femoris vastus lateralis, and right adductor minimus muscles. Potential low-grade fasciitis along these muscle margins. 2. Abnormal patchy marrow edema and enhancement distally in the femoral diaphysis and metaphysis, suspicious for early osteomyelitis.   Electronically Signed   By: WSherryl BartersM.D.   On: 09/08/2013 09:36   Dg Knee Complete 4 Views Right  09/06/2013   CLINICAL DATA:  Lower leg pain, prior knee  replacement  EXAM: RIGHT KNEE - COMPLETE 4+ VIEW  COMPARISON:  02/06/2013  FINDINGS: Status post right knee arthroplasty. Components appear aligned. No acute osseous or hardware abnormality. No malalignment, fracture or joint effusion. Peripheral atherosclerosis noted.  IMPRESSION: Status post right knee arthroplasty. No acute finding by plain radiography.   Electronically Signed   By: TDaryll BrodM.D.   On: 09/06/2013 16:53   Dg Foot Complete Right  09/06/2013   CLINICAL DATA:  73year old male with pain radiating to the right lower extremity and increasing. Initial encounter.  EXAM: RIGHT FOOT COMPLETE - 3+ VIEW  COMPARISON:  None.  FINDINGS: MTP joint subluxation. Bone mineralization is within normal limits for age. Calcaneus intact with degenerative spurring. Vascular calcifications in the right foot. No acute fracture or dislocation identified.  IMPRESSION: No acute fracture or dislocation identified about the right foot.   Electronically Signed   By: LLars PinksM.D.   On: 09/06/2013 16:54   Medications: I have reviewed the patient's current medications. Scheduled Meds: . allopurinol  300 mg  Oral Daily  . aspirin EC  81 mg Oral Daily  . [START ON 09/09/2013] clopidogrel  75 mg Oral Daily  . gabapentin  1,200 mg Oral 3 times per day  . heparin  5,000 Units Subcutaneous 3 times per day  . insulin aspart  0-9 Units Subcutaneous TID WC  . lisinopril  20 mg Oral Daily  . pantoprazole  40 mg Oral Daily  . simvastatin  20 mg Oral q1800   Continuous Infusions:  PRN Meds:.oxyCODONE-acetaminophen Assessment/Plan: Principal Problem:   Right leg swelling Active Problems:   Diabetes mellitus   Hypertension   PVD- s/p  PTA and stenting of left common/external iliac artery 1/7   Claudication in peripheral vascular disease, life style limiting   S/P angioplasty with stent, to distal RT ext. iliac artery   Tobacco abuse disorder   Spinal stenosis of lumbar region   Hyperlipidemia   Cellulitis    PAD (peripheral artery disease)  Right knee/hip pain -MRI right hi;/femur oday revealed right iliopsoas abscess with surrounding myositis. Potential low grade fascitis. Also possible early osteomyelitis on femoral diaphysis and metaphysis.  - obtain blood culture, CBC, CRP, ESR.  - IR will do CT guided fluid sampling tomorrow with gram stain and culture.- will start abx after sampling done. - holding plavix and heparin tomorrow morning. - percocet 5-323m q6hr +gabapentin.  Right foot swelling - improving with compression stocking unclear etiology. Low suspicion of cellulitis since he has no systemic signs of infection, there is no obvious local signs of infection. R foot slightly warm and more edematous than left. However, given his severe PAD and lack of tenderness of the foot the picture is complicated. It points to other possible causes. He has worsening arterial dz appears to have patent stents that were placed in the past. Ext is not cool or blue.  Possibly 2/2 to arthritis, although septic athritis seem less likely because of lack of systemic signs of infection (fever/ leukocytosis). Doesn't appear to have fx given FROM, although complaining of R hip pain.  arterial duplex: right EIA stent patent. Right SFA known occlusion. Right calf runoff. On 03/10/13 - had highly calcified right SFA, stented right external iliac artery. On Jan 2015 - stent of left external iliac artery with Dr. BAlvester Chou  DVT less likely given negative doppler on 09/02/13. Consider gouty arthritis as one of the differential given hx of gout. Is on allopurinol at home.  - xray of right hip, knee, ankle, and foot all normal except for calcifications.  - improving with compression stocking PAD s/p stent on R and L Ext Iliac arteries  - Cont plavix, zocor, asa  - arterial doppler shows patent stents and at baseline. - Already has outpatient f/up with Dr. BAlvester Choufor later this month.   HTN - elevated -likely 2/2 to pain - cont  lisinopril 20 mg PO daily here for now. Might increase to 40 mg if needed.   DM II  - well controlled. Hgba1c 6.7 02/03/13.  - hold home glipizide for now.  - SSI sensitive and CBG monitoring.   PPx: protonix + heparin  Code: DNR. Fully discussed with patient at the time of admission. He is fully aware of the decision and seems to have capacity to make his own decisions.  Diet: carb mod.  Dispo: Disposition is deferred at this time, awaiting improvement of current medical problems.  Anticipated discharge in approximately 2-3 day(s).   The patient does have a current PCP (Redmond PullingOArna Medici MD)  and does need an [facility] hospital follow-up appointment after discharge.  The patient does not know have transportation limitations that hinder transportation to clinic appointments.  .Services Needed at time of discharge: Y = Yes, Blank = No PT:   OT:   RN:   Equipment:   Other:     LOS: 2 days   Dellia Nims, MD 09/08/2013, 2:52 PM

## 2013-09-08 NOTE — H&P (Signed)
Marc [name] is an 73 y.o. male.   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 Hypertension   . Ulcer disease   . Colon polyp   . Gout   . Bradycardia   . Hypercholesteremia   . PAD (peripheral artery disease)   . Chronic bronchitis     "used to be very susceptible when I was a kid & smoking" (02/04/2012)  . Type II diabetes mellitus   . History of blood transfusion ~ 2007    "10 of them when I had stomach bleeding"  . Bleeding stomach ulcer 2007  . Arthritis     "qwhere that you can get it; mostly in my shoulders" (02/04/2012)  . Gout   . S/P angioplasty with stent, to distal RT ext. iliac artery 03/10/2012  . Tobacco abuse disorder 03/10/2012  . GERD (gastroesophageal reflux disease)   . Muscle spasms of neck     has spasms over body when get up from sitting  . Pneumonia     Past Surgical History  Procedure Laterality Date  . Splenectomy  ~ 1957  . Anterior cervical decomp/discectomy fusion  ~ 2007  . Upper endoscopy w/ sclerotherapy  ~ 2007  . Polypectomy    . Colonoscopy    . Iliac artery stent      left common/notes (02/04/2012)  . Hernia repair  ~ 2007    UHR (02/04/2012)  . Tonsillectomy  ~ 1947  . Sfa    . Sfa  03/10/2012    ILLEAC STENOSIS  . Back surgery  11/14  . Total knee arthroplasty Right 02/03/2013    Procedure: TOTAL KNEE ARTHROPLASTY;  Surgeon: [name], MD;  Location: [place];  Service: Orthopedics;  Laterality: Right;    Family History  Problem Relation Age of Onset  . Colon cancer Paternal Uncle     Uncle  . Heart disease Father   . Heart  disease Mother    Social History:  reports that he has been smoking Cigarettes.  He has a 116 pack-year smoking history. He has quit using smokeless tobacco. His smokeless tobacco use included Chew. He reports that he drinks about 1.2 ounces of alcohol per week. He reports that he does not use illicit drugs.  Allergies: No Known Allergies  Medications Prior to Admission  Medication Sig Dispense Refill  . allopurinol (ZYLOPRIM) 300 MG tablet Take 300 mg by mouth daily.      [name] aspirin EC 325 MG tablet Take 1 tablet (325 mg total) by mouth 2 (two) times daily.  30 tablet  0  . atorvastatin (LIPITOR) 10 MG tablet Take 10 mg by mouth daily.      . clopidogrel (PLAVIX) 75 MG tablet Take 1 tablet (75 mg total) by mouth daily. <please schedule appointment for future refills>  30 tablet  0  . furosemide (LASIX) 80 MG tablet Take 40 mg by mouth daily.      [name] gabapentin (NEURONTIN) 600 MG tablet Take 1,200 mg by mouth every 8 (eight) hours.       [name]  glimepiride (AMARYL) 4 MG tablet Take 4 mg by mouth 2 (two) times daily.       [name] HYDROcodone-acetaminophen (NORCO/VICODIN) 5-325 MG per tablet Take 2 tablets by mouth every 8 (eight) hours as needed for moderate pain.       [name] lisinopril (PRINIVIL,ZESTRIL) 20 MG tablet Take 20 mg by mouth daily.        [name] lovastatin (MEVACOR) 40 MG tablet Take 40 mg by mouth at bedtime.      [name] omeprazole (PRILOSEC OTC) 20 MG tablet Take 40 mg by mouth daily.       . predniSONE (DELTASONE) 10 MG tablet Take 10 mg by mouth daily with breakfast.        Results for orders placed during the hospital encounter of 09/06/13 (from the past 48 hour(s))  GLUCOSE, CAPILLARY     Status: Abnormal   Collection Time    09/06/13  5:33 PM      Result Value Ref Range   Glucose-Capillary 172 (*) 70 - 99 mg/dL  GLUCOSE, CAPILLARY     Status: None   Collection Time    09/06/13 10:45 PM      Result Value Ref Range   Glucose-Capillary 80  70 - 99 mg/dL  GLUCOSE, CAPILLARY     Status: Abnormal    Collection Time    09/07/13  7:35 AM      Result Value Ref Range   Glucose-Capillary 137 (*) 70 - 99 mg/dL  GLUCOSE, CAPILLARY     Status: Abnormal   Collection Time    09/07/13 12:02 PM      Result Value Ref Range   Glucose-Capillary 111 (*) 70 - 99 mg/dL  GLUCOSE, CAPILLARY     Status: Abnormal   Collection Time    09/07/13  5:15 PM      Result Value Ref Range   Glucose-Capillary 150 (*) 70 - 99 mg/dL  GLUCOSE, CAPILLARY     Status: Abnormal   Collection Time    09/07/13 10:13 PM      Result Value Ref Range   Glucose-Capillary 164 (*) 70 - 99 mg/dL  GLUCOSE, CAPILLARY     Status: Abnormal   Collection Time    09/08/13  9:28 AM      Result Value Ref Range   Glucose-Capillary 117 (*) 70 - 99 mg/dL  GLUCOSE, CAPILLARY     Status: Abnormal   Collection Time    09/08/13 12:02 PM      Result Value Ref Range   Glucose-Capillary 173 (*) 70 - 99 mg/dL   Dg Hip Complete Right  09/06/2013   CLINICAL DATA:  Right lower extremity pain across all joints and foot 2 weeks.  EXAM: RIGHT HIP - COMPLETE 2+ VIEW  COMPARISON:  None.  FINDINGS: There are mild symmetric degenerative changes of the hips. There is no acute fracture or dislocation. There are mild degenerative changes over the symphysis pubis joint. Fusion hardware is present over the lower lumbar spine. Vascular stents are seen over the pelvis bilaterally. There are several left pelvic phleboliths.  IMPRESSION: No acute findings.   Electronically Signed   By: Marin Olp M.D.   On: 09/06/2013 16:55   Dg Ankle Complete Right  09/06/2013   CLINICAL DATA:  73 year old male with pain radiating to the right lower extremity and increasing. Initial encounter.  EXAM: RIGHT ANKLE - COMPLETE 3+ VIEW  COMPARISON:  Right foot series from the same day reported separately.  FINDINGS: Extensive chronic appearing  irregular calcification about the medial and lateral malleoli. Mortise joint alignment preserved. Talar dome appears intact. No joint  effusion. Calcaneus intact with degenerative spurring. No acute fracture or dislocation. Calcified atherosclerosis.  IMPRESSION: Extensive chronic changes about the medial and lateral malleoli. No acute osseous abnormality identified at the right ankle.   Electronically Signed   By: Lars Pinks M.D.   On: 09/06/2013 16:57   Mr Hip Right W Wo Contrast  09/08/2013   CLINICAL DATA:  Right hip and leg pain.  EXAM: MRI OF THE RIGHT FEMUR WITHOUT AND WITH CONTRAST; MRI OF THE RIGHT HIP WITHOUT AND WITH CONTRAST  TECHNIQUE: Multiplanar, multisequence MR imaging of the lower right extremity was performed both before and after administration of intravenous contrast.; Multiplanar, multisequence MR imaging was performed both before and after administration of intravenous contrast.  CONTRAST:  65mL MULTIHANCE GADOBENATE DIMEGLUMINE 529 MG/ML IV SOLN  COMPARISON:  08/28/2013  FINDINGS: RIGHT HIP MRI:  Abnormal edema in the right iliopsoas muscle, images 6 through 39 of series 4, extending to the iliopsoas insertion site on the lesser trochanter. There is an associated abnormal fluid collection in the distal iliopsoas with mildly irregular enhancing margins, fluid collection measuring 2.6 x 5.3 by 1.6 cm. This is visible on image 27 of series 19.  A small amount of edema tracks within and along the right vastus intermedius and vastus lateralis muscles anteriorly as shown on image 37 of series 4, and there is also low-grade edema tracking along the right adductor minimus inferiorly, image 38 series 4.  Proximal hamstring tendinopathy. Pubic bodies and adjacent aponeurotic attachments intact. No significant hip joint effusion or abnormal regional osseous edema. No sciatic notch impingement or sciatic nerve asymmetry.  RIGHT UPPER LEG MRI:  Right total knee prosthesis obscures findings in the vicinity of the rib right knee.  In addition to the above-described findings, there is abnormal edema and enhancement tracking along the  anterior superficial fascia margins of the anterior compartment of the thigh , particularly along the anterior margins of the rectus femoris and vastus lateralis as shown on image 22 of series 14. There is likely some faint edema within the rectus femoris and vastus lateralis muscles anteriorly. No additional drainable fluid collection is identified.  There is some patchy asymmetric marrow edema in the distal diaphysis and distal metaphysis of the right femur shown on images 17-18 of series 12 and image 16 of series 21. My impression on the axial images is that there is some associated contrast enhancement. No radiographic abnormality was visible in this vicinity on 09/06/2013.  IMPRESSION: 1. Small but irregular right iliopsoas fluid collection with enhancing margins and surrounding myositis. Appearance compatible with right iliopsoas distal abscess. Mild myositis involving the right vastus intermedius, rectus femoris vastus lateralis, and right adductor minimus muscles. Potential low-grade fasciitis along these muscle margins. 2. Abnormal patchy marrow edema and enhancement distally in the femoral diaphysis and metaphysis, suspicious for early osteomyelitis.   Electronically Signed   By: Sherryl Barters M.D.   On: 09/08/2013 09:36   Mr Femur Right W Wo Contrast  09/08/2013   CLINICAL DATA:  Right hip and leg pain.  EXAM: MRI OF THE RIGHT FEMUR WITHOUT AND WITH CONTRAST; MRI OF THE RIGHT HIP WITHOUT AND WITH CONTRAST  TECHNIQUE: Multiplanar, multisequence MR imaging of the lower right extremity was performed both before and after administration of intravenous contrast.; Multiplanar, multisequence MR imaging was performed both before and after administration of intravenous contrast.  CONTRAST:  66mL MULTIHANCE GADOBENATE DIMEGLUMINE 529 MG/ML IV SOLN  COMPARISON:  08/28/2013  FINDINGS: RIGHT HIP MRI:  Abnormal edema in the right iliopsoas muscle, images 6 through 39 of series 4, extending to the iliopsoas  insertion site on the lesser trochanter. There is an associated abnormal fluid collection in the distal iliopsoas with mildly irregular enhancing margins, fluid collection measuring 2.6 x 5.3 by 1.6 cm. This is visible on image 27 of series 19.  A small amount of edema tracks within and along the right vastus intermedius and vastus lateralis muscles anteriorly as shown on image 37 of series 4, and there is also low-grade edema tracking along the right adductor minimus inferiorly, image 38 series 4.  Proximal hamstring tendinopathy. Pubic bodies and adjacent aponeurotic attachments intact. No significant hip joint effusion or abnormal regional osseous edema. No sciatic notch impingement or sciatic nerve asymmetry.  RIGHT UPPER LEG MRI:  Right total knee prosthesis obscures findings in the vicinity of the rib right knee.  In addition to the above-described findings, there is abnormal edema and enhancement tracking along the anterior superficial fascia margins of the anterior compartment of the thigh , particularly along the anterior margins of the rectus femoris and vastus lateralis as shown on image 22 of series 14. There is likely some faint edema within the rectus femoris and vastus lateralis muscles anteriorly. No additional drainable fluid collection is identified.  There is some patchy asymmetric marrow edema in the distal diaphysis and distal metaphysis of the right femur shown on images 17-18 of series 12 and image 16 of series 21. My impression on the axial images is that there is some associated contrast enhancement. No radiographic abnormality was visible in this vicinity on 09/06/2013.  IMPRESSION: 1. Small but irregular right iliopsoas fluid collection with enhancing margins and surrounding myositis. Appearance compatible with right iliopsoas distal abscess. Mild myositis involving the right vastus intermedius, rectus femoris vastus lateralis, and right adductor minimus muscles. Potential low-grade  fasciitis along these muscle margins. 2. Abnormal patchy marrow edema and enhancement distally in the femoral diaphysis and metaphysis, suspicious for early osteomyelitis.   Electronically Signed   By: Sherryl Barters M.D.   On: 09/08/2013 09:36   Dg Knee Complete 4 Views Right  09/06/2013   CLINICAL DATA:  Lower leg pain, prior knee replacement  EXAM: RIGHT KNEE - COMPLETE 4+ VIEW  COMPARISON:  02/06/2013  FINDINGS: Status post right knee arthroplasty. Components appear aligned. No acute osseous or hardware abnormality. No malalignment, fracture or joint effusion. Peripheral atherosclerosis noted.  IMPRESSION: Status post right knee arthroplasty. No acute finding by plain radiography.   Electronically Signed   By: Daryll Brod M.D.   On: 09/06/2013 16:53   Dg Foot Complete Right  09/06/2013   CLINICAL DATA:  73 year old male with pain radiating to the right lower extremity and increasing. Initial encounter.  EXAM: RIGHT FOOT COMPLETE - 3+ VIEW  COMPARISON:  None.  FINDINGS: MTP joint subluxation. Bone mineralization is within normal limits for age. Calcaneus intact with degenerative spurring. Vascular calcifications in the right foot. No acute fracture or dislocation identified.  IMPRESSION: No acute fracture or dislocation identified about the right foot.   Electronically Signed   By: Lars Pinks M.D.   On: 09/06/2013 16:54    Review of Systems  Constitutional: Positive for malaise/fatigue. Negative for fever.  Respiratory: Negative for shortness of breath.   Cardiovascular: Negative for chest pain.  Gastrointestinal: Negative for nausea, vomiting and abdominal pain.  Musculoskeletal:  Positive for back pain and joint pain.  Neurological: Positive for weakness.  Psychiatric/Behavioral: Positive for substance abuse.       Smoker    Blood pressure 155/56, pulse 48, temperature 97.8 F (36.6 C), temperature source Oral, resp. rate 17, height 6\' 1"  (1.854 m), weight 92.534 kg (204 lb), SpO2  99.00%. Physical Exam  Constitutional: He is oriented to person, place, and time. He appears well-developed.  Cardiovascular: Normal rate and regular rhythm.   No murmur heard. Respiratory: Effort normal and breath sounds normal. He has no wheezes.  GI: Soft. Bowel sounds are normal. There is no tenderness.  Musculoskeletal: Normal range of motion.  Rt knee scar Swelling Rt hip pain  Neurological: He is alert and oriented to person, place, and time.  Skin: Skin is warm and dry.  Psychiatric: He has a normal mood and affect. His behavior is normal. Judgment and thought content normal.     Assessment/Plan Rt hip and leg pain and swelling MRI reveals iliopsoas abscess Now scheduled for aspiration in am Will hold Plavix and Hep Inj dose 8/13 per Dr Kathlene Cote Pt aware of procedure benefits and risks and agreeable to proceed Consent signed andin chart  Morris Markham A 09/08/2013, 3:25 PM

## 2013-09-08 NOTE — Evaluation (Signed)
Physical Therapy Evaluation Patient Details Name: Marc [name] MRN: [id number] DOB: October 26, 1940 Today's Date: 09/08/2013   History of Present Illness  73 yo male with hx of HTN, gout, bradycardia, DM II, PAD s/p stent R ext iliac artery, and hx of recent cellulitis here with increased right foot swelling for few days and also right knee/hip pain for 3 weeks. He had a right knee replacement done on 01/2013, was doing well after that. 3 weeks ago he started having pain on the right leg. Pt with notable swelling and pain on Rt LE; etiology unknown.   Clinical Impression  Pt adm due to above. Pt presents to be at his baseline from a mobility standpoint. Was able to ambulate up/down flight of steps without LOB. Cont to c/o pain in rt LE; educated on hip flexor and quadricep stretching, however, questionable compliance with current HEP. Pt encouraged to ambulate unit as tolerated while in hospital setting, pt agreeable. Will sign off at this time. Thanks.     Follow Up Recommendations Outpatient PT;Supervision - Intermittent;Other (comment) (for Rt LE pain/strengthening/ stretching )    Equipment Recommendations  None recommended by PT    Recommendations for Other Services       Precautions / Restrictions Precautions Precautions: None Restrictions Weight Bearing Restrictions: No      Mobility  Bed Mobility Overal bed mobility: Independent             General bed mobility comments: able to transfer to EOB independently  Transfers Overall transfer level: Modified independent Equipment used: None             General transfer comment: pt tranfer from sitting position on EOB to standing; was reaching for external object for bracing then given cane to stabilize; no LOB noted  Ambulation/Gait Ambulation/Gait assistance: Modified independent (Device/Increase time) Ambulation Distance (Feet): 180 Feet Assistive device: Straight cane Gait Pattern/deviations: Step-through  pattern;Decreased stride length;Trunk flexed;Antalgic Gait velocity: decreased due to pain in Rt LE  Gait velocity interpretation: Below normal speed for age/gender General Gait Details: pt with antalgic gt due to pain in Rt LE; pt more stable when ambulating with cane; 1 standing rest break due to pain and fatigue ; reports he is at his baseline   Stairs Stairs: Yes Stairs assistance: Modified independent (Device/Increase time) Stair Management: One rail Right;Step to pattern;Forwards;Alternating pattern Number of Stairs: 12 General stair comments: pt able to ambulate flight of steps without LOB; min cues for proper technique to ensure safety   Wheelchair Mobility    Modified Rankin (Stroke Patients Only)       Balance Overall balance assessment: No apparent balance deficits (not formally assessed)                                           Pertinent Vitals/Pain Pain Assessment: 0-10 Pain Score: 5  Pain Location: Rt hip/quad Pain Descriptors / Indicators: Aching Pain Intervention(s): Repositioned    Home Living Family/patient expects to be discharged to:: Private residence Living Arrangements: Children Available Help at Discharge: Family;Available PRN/intermittently Type of Home: Mobile home Home Access: Stairs to enter Entrance Stairs-Rails: Left Entrance Stairs-Number of Steps: 6-8 Home Layout: One level Home Equipment: Walker - 2 wheels;Cane - single point;Shower seat Additional Comments: pt has tub shower with shower seat    Prior Function Level of Independence: Independent  Comments: ambulates with cane PRN     Hand Dominance   Dominant Hand: Right    Extremity/Trunk Assessment   Upper Extremity Assessment: Defer to OT evaluation           Lower Extremity Assessment: RLE deficits/detail RLE Deficits / Details: limited by pain but WFL     Cervical / Trunk Assessment: Normal  Communication   Communication: No  difficulties  Cognition Arousal/Alertness: Awake/alert Behavior During Therapy: WFL for tasks assessed/performed;Impulsive Overall Cognitive Status: Within Functional Limits for tasks assessed                      General Comments      Exercises Other Exercises Other Exercises: educated on standing hip flexor stretching and quad stretching pt able to demo 2 stretching techniques; educate don holding 10 sec x 10 each; pt reports he has multiple exercises on HEP at home from previous therapies that he does daily      Assessment/Plan    PT Assessment Patent does not need any further PT services  PT Diagnosis     PT Problem List    PT Treatment Interventions     PT Goals (Current goals can be found in the Care Plan section) Acute Rehab PT Goals Patient Stated Goal: home today and be able to smoke  PT Goal Formulation: No goals set, d/c therapy    Frequency     Barriers to discharge        Co-evaluation               End of Session Equipment Utilized During Treatment: Gait belt Activity Tolerance: Patient tolerated treatment well Patient left: in bed;with call bell/phone within reach Nurse Communication: Mobility status    Functional Assessment Tool Used: clinical judgement Functional Limitation: Mobility: Walking and moving around Mobility: Walking and Moving Around Current Status [phone]): 0 percent impaired, limited or restricted Mobility: Walking and Moving Around Goal Status [phone]): 0 percent impaired, limited or restricted Mobility: Walking and Moving Around Discharge Status [phone]): 0 percent impaired, limited or restricted    Time: 1314-1330 PT Time Calculation (min): 16 min   Charges:   PT Evaluation $Initial PT Evaluation Tier I: 1 Procedure PT Treatments $Gait Training: 8-22 mins   PT G Codes:   Functional Assessment Tool Used: clinical judgement Functional Limitation: Mobility: Walking and moving around    Mc[place]od, [place], [place]   [phone] 09/08/2013, 2:26 PM

## 2013-09-08 NOTE — Progress Notes (Signed)
  Date: 09/08/2013  Patient name: Marc [name]  Medical record number: [id number]  Date of birth: Jun 17, 1940   This patient has been seen and the plan of care was discussed with the house staff. Please see their note for complete details. I concur with their findings with the following additions/corrections:  MRI findings suggest small iliopsoas abscess with surrounding myositis, fascitis which could be causing the worsening rightsided leg pain. We will see if IR can sample the fluid collection so specimen can be sent for culture. Patient is clinically stable, thus we will wait on starting antibiotics. Will check blood cx, sed rate, and crp and cbc with diff  Carlyle Basques, MD 09/08/2013, 8:49 PM

## 2013-09-09 ENCOUNTER — Observation Stay (HOSPITAL_COMMUNITY): Payer: Medicare Other

## 2013-09-09 DIAGNOSIS — Z791 Long term (current) use of non-steroidal anti-inflammatories (NSAID): Secondary | ICD-10-CM | POA: Diagnosis not present

## 2013-09-09 DIAGNOSIS — M729 Fibroblastic disorder, unspecified: Secondary | ICD-10-CM

## 2013-09-09 DIAGNOSIS — IMO0001 Reserved for inherently not codable concepts without codable children: Secondary | ICD-10-CM

## 2013-09-09 DIAGNOSIS — Z96659 Presence of unspecified artificial knee joint: Secondary | ICD-10-CM | POA: Diagnosis not present

## 2013-09-09 DIAGNOSIS — L02419 Cutaneous abscess of limb, unspecified: Secondary | ICD-10-CM | POA: Diagnosis present

## 2013-09-09 DIAGNOSIS — E1169 Type 2 diabetes mellitus with other specified complication: Secondary | ICD-10-CM | POA: Diagnosis present

## 2013-09-09 DIAGNOSIS — I70219 Atherosclerosis of native arteries of extremities with intermittent claudication, unspecified extremity: Secondary | ICD-10-CM | POA: Diagnosis present

## 2013-09-09 DIAGNOSIS — E785 Hyperlipidemia, unspecified: Secondary | ICD-10-CM | POA: Diagnosis present

## 2013-09-09 DIAGNOSIS — Z8249 Family history of ischemic heart disease and other diseases of the circulatory system: Secondary | ICD-10-CM | POA: Diagnosis not present

## 2013-09-09 DIAGNOSIS — F17200 Nicotine dependence, unspecified, uncomplicated: Secondary | ICD-10-CM | POA: Diagnosis present

## 2013-09-09 DIAGNOSIS — K6812 Psoas muscle abscess: Secondary | ICD-10-CM | POA: Diagnosis present

## 2013-09-09 DIAGNOSIS — Z7902 Long term (current) use of antithrombotics/antiplatelets: Secondary | ICD-10-CM | POA: Diagnosis not present

## 2013-09-09 DIAGNOSIS — I10 Essential (primary) hypertension: Secondary | ICD-10-CM | POA: Diagnosis present

## 2013-09-09 DIAGNOSIS — Z66 Do not resuscitate: Secondary | ICD-10-CM | POA: Diagnosis present

## 2013-09-09 DIAGNOSIS — K219 Gastro-esophageal reflux disease without esophagitis: Secondary | ICD-10-CM | POA: Diagnosis present

## 2013-09-09 DIAGNOSIS — Z7982 Long term (current) use of aspirin: Secondary | ICD-10-CM | POA: Diagnosis not present

## 2013-09-09 DIAGNOSIS — I70209 Unspecified atherosclerosis of native arteries of extremities, unspecified extremity: Secondary | ICD-10-CM | POA: Diagnosis present

## 2013-09-09 DIAGNOSIS — M109 Gout, unspecified: Secondary | ICD-10-CM | POA: Diagnosis present

## 2013-09-09 DIAGNOSIS — IMO0002 Reserved for concepts with insufficient information to code with codable children: Secondary | ICD-10-CM | POA: Diagnosis not present

## 2013-09-09 DIAGNOSIS — M79609 Pain in unspecified limb: Secondary | ICD-10-CM | POA: Diagnosis present

## 2013-09-09 LAB — HIV ANTIBODY (ROUTINE TESTING W REFLEX): HIV 1&2 Ab, 4th Generation: NONREACTIVE

## 2013-09-09 LAB — GLUCOSE, CAPILLARY
GLUCOSE-CAPILLARY: 126 mg/dL — AB (ref 70–99)
GLUCOSE-CAPILLARY: 129 mg/dL — AB (ref 70–99)
Glucose-Capillary: 108 mg/dL — ABNORMAL HIGH (ref 70–99)
Glucose-Capillary: 149 mg/dL — ABNORMAL HIGH (ref 70–99)

## 2013-09-09 LAB — HEPATITIS C ANTIBODY: HCV AB: NEGATIVE

## 2013-09-09 MED ORDER — VANCOMYCIN HCL IN DEXTROSE 1-5 GM/200ML-% IV SOLN
1000.0000 mg | INTRAVENOUS | Status: AC
  Administered 2013-09-09: 1000 mg via INTRAVENOUS
  Filled 2013-09-09: qty 200

## 2013-09-09 MED ORDER — SODIUM CHLORIDE 0.9 % IV SOLN
INTRAVENOUS | Status: AC | PRN
  Administered 2013-09-09: 10 mL/h via INTRAVENOUS

## 2013-09-09 MED ORDER — VANCOMYCIN HCL IN DEXTROSE 750-5 MG/150ML-% IV SOLN
750.0000 mg | Freq: Two times a day (BID) | INTRAVENOUS | Status: DC
  Administered 2013-09-10: 750 mg via INTRAVENOUS
  Filled 2013-09-09 (×3): qty 150

## 2013-09-09 MED ORDER — FENTANYL CITRATE 0.05 MG/ML IJ SOLN
INTRAMUSCULAR | Status: AC
  Filled 2013-09-09: qty 2

## 2013-09-09 MED ORDER — LIDOCAINE HCL 1 % IJ SOLN
INTRAMUSCULAR | Status: AC
  Filled 2013-09-09: qty 10

## 2013-09-09 MED ORDER — FENTANYL CITRATE 0.05 MG/ML IJ SOLN
INTRAMUSCULAR | Status: AC | PRN
  Administered 2013-09-09: 25 ug via INTRAVENOUS

## 2013-09-09 MED ORDER — OXYCODONE HCL 5 MG PO TABS
10.0000 mg | ORAL_TABLET | Freq: Four times a day (QID) | ORAL | Status: DC | PRN
  Administered 2013-09-09 – 2013-09-10 (×3): 10 mg via ORAL
  Filled 2013-09-09 (×3): qty 2

## 2013-09-09 MED ORDER — DEXTROSE 5 % IV SOLN
1.0000 g | INTRAVENOUS | Status: DC
  Administered 2013-09-09: 1 g via INTRAVENOUS
  Filled 2013-09-09 (×2): qty 10

## 2013-09-09 MED ORDER — MIDAZOLAM HCL 2 MG/2ML IJ SOLN
INTRAMUSCULAR | Status: AC
  Filled 2013-09-09: qty 4

## 2013-09-09 MED ORDER — MIDAZOLAM HCL 2 MG/2ML IJ SOLN
INTRAMUSCULAR | Status: AC | PRN
  Administered 2013-09-09: 1 mg via INTRAVENOUS

## 2013-09-09 NOTE — Progress Notes (Signed)
UR Completed Jaeceon Michelin Graves-Bigelow, RN,BSN [phone]  

## 2013-09-09 NOTE — Progress Notes (Signed)
ANTIBIOTIC CONSULT NOTE - INITIAL  Pharmacy Consult for Rocephin, Vanco Indication: ilopsoas abscess   No Known Allergies  Patient Measurements: Height: 6\' 1"  (185.4 cm) Weight: 204 lb (92.534 kg) IBW/kg (Calculated) : 79.9 Adjusted Body Weight:   Vital Signs: Temp: 97.7 F (36.5 C) (08/13 1300) Temp src: Oral (08/13 1300) BP: 140/56 mmHg (08/13 1300) Pulse Rate: 49 (08/13 1300) Intake/Output from previous day:   Intake/Output from this shift: Total I/O In: 240 [P.O.:240] Out: -   Labs: No results found for this basename: WBC, HGB, PLT, LABCREA, CREATININE,  in the last 72 hours Estimated Creatinine Clearance: 55.5 ml/min (by C-G formula based on Cr of 1.34). No results found for this basename: VANCOTROUGH, VANCOPEAK, VANCORANDOM, [place], [place], [place], [place], TOBRAPEAK, TOBRARND, AMIKACINPEAK, AMIKACINTROU, AMIKACIN,  in the last 72 hours   Microbiology: Recent Results (from the past 720 hour(s))  CULTURE, BLOOD (ROUTINE X 2)     Status: None   Collection Time    09/08/13  4:50 PM      Result Value Ref Range Status   Specimen Description BLOOD RIGHT ANTECUBITAL   Final   Special Requests BOTTLES DRAWN AEROBIC AND ANAEROBIC 10CC   Final   Culture  Setup Time     Final   Value: 09/08/2013 21:28     Performed at [facility]   Culture     Final   Value:        BLOOD CULTURE RECEIVED NO GROWTH TO DATE CULTURE WILL BE HELD FOR 5 DAYS BEFORE ISSUING A FINAL NEGATIVE REPORT     Performed at [facility]   Report Status PENDING   Incomplete  CULTURE, BLOOD (ROUTINE X 2)     Status: None   Collection Time    09/08/13  5:00 PM      Result Value Ref Range Status   Specimen Description BLOOD RIGHT HAND   Final   Special Requests BOTTLES DRAWN AEROBIC AND ANAEROBIC 10CC   Final   Culture  Setup Time     Final   Value: 09/08/2013 21:28     Performed at [facility]   Culture     Final   Value:        BLOOD CULTURE RECEIVED NO GROWTH  TO DATE CULTURE WILL BE HELD FOR 5 DAYS BEFORE ISSUING A FINAL NEGATIVE REPORT     Performed at [facility]   Report Status PENDING   Incomplete    Medical History: Past Medical History  Diagnosis Date  . Hypertension   . Ulcer disease   . Colon polyp   . Gout   . Bradycardia   . Hypercholesteremia   . PAD (peripheral artery disease)   . Chronic bronchitis     "used to be very susceptible when I was a kid & smoking" (02/04/2012)  . Type II diabetes mellitus   . History of blood transfusion ~ 2007    "10 of them when I had stomach bleeding"  . Bleeding stomach ulcer 2007  . Arthritis     "qwhere that you can get it; mostly in my shoulders" (02/04/2012)  . Gout   . S/P angioplasty with stent, to distal RT ext. iliac artery 03/10/2012  . Tobacco abuse disorder 03/10/2012  . GERD (gastroesophageal reflux disease)   . Muscle spasms of neck     has spasms over body when get up from sitting  . 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 MRI reveals Rt hip iliopsoas abscess  Anticoagulation: SQ heparin. Last CBC WNL  Infectious Disease: Iliopsas abscess with myositis and fasciatitis  s/p aspiration 8/13, R foot also has swelling. Tmax 99.2. WBC 6.9  Cardiovascular: HTN, PVD with claudication s/p R and L Ext Iliac arteries, HLD, BP ok but HR 50 on ASA81, Plavix, lisinopril, Zocor  Endocrinology: Gout,  DM on allopurinol, SSI with CBG 98-173  Gastrointestinal / Nutrition: h/o bleeding ulcer 2007, GERD on po PPI  Neurology: spinal stenosis, arthritis, muscle spasms on Gabapentin, prn oxycodone  Nephrology: Scr 1.34 with CrCl 55  Pulmonary: tobacco  Hematology / Oncology  PTA Medication Issues: Lasix, Amaryl, Prednisone   Goal of Therapy:  Vancomycin trough level 10-15 mcg/ml  Plan:  Rocephin 1g IV q24h Vanco 1g IV x 1 then 750mg  IV q12h.   Marc [name]  S. Alford Highland, PharmD, BCPS Clinical Staff Pharmacist Pager [phone]  Marc [name] 09/09/2013,1:45 PM

## 2013-09-09 NOTE — Progress Notes (Addendum)
Subjective:  Continues to have right sided groin pain and knee/hip pain. Also has swelling on right foot without tenderness. Had CT guided R ilopsoas absces aspirated today. Fluid appeared serosanguinous only 2 drops. No changes otherwise. No cp/sob/ha/n/v.   Objective: Vital signs in last 24 hours: Filed Vitals:   09/09/13 0940 09/09/13 0945 09/09/13 0950 09/09/13 1040  BP: 148/55 118/61 132/56 134/62  Pulse: 48 48 47 46  Temp:    97.5 F (36.4 C)  TempSrc:    Oral  Resp: _0 Height:      Weight:      SpO2: 99% 99% 96% 99%   Weight change:  No intake or output data in the 24 hours ending 09/09/13 1316  Vitals reviewed. General: resting in bed, NAD HEENT: PERRL, EOMI, no scleral icterus Cardiac: RRR, no rubs, murmurs or gallops Pulm: clear to auscultation bilaterally, no wheezes, rales, or rhonchi Abd: soft, nontender, nondistended, BS present Ext:   Right foot non tender, nonerythematous. Has swelling on dorsal surface but decreased with the compression socks. There are 2 skin lesions around the ankle joints medially and laterally, both well healed without discharge.   Right knee TTP anterior suprapatellar region.  Neuro: alert and oriented X3, cranial nerves II-XII grossly intact, strength and sensation to light touch equal in bilateral upper and lower extremities  Lab Results: Basic Metabolic Panel:  Recent Labs Lab 09/06/13 0843  NA 145  K 4.8  CL 105  CO2 28  GLUCOSE 206*  BUN 23  CREATININE 1.34  CALCIUM 9.4   Liver Function Tests: No results found for this basename: AST, ALT, ALKPHOS, BILITOT, PROT, ALBUMIN,  in the last 168 hours No results found for this basename: LIPASE, AMYLASE,  in the last 168 hours No results found for this basename: AMMONIA,  in the last 168 hours CBC:  Recent Labs Lab 09/06/13 0843  WBC 6.9  NEUTROABS 4.5  HGB 14.1  HCT 43.0  MCV 96.4  PLT 201   Cardiac Enzymes: No results found for this basename:  CKTOTAL, CKMB, CKMBINDEX, TROPONINI,  in the last 168 hours BNP: No results found for this basename: PROBNP,  in the last 168 hours D-Dimer: No results found for this basename: DDIMER,  in the last 168 hours CBG:  Recent Labs Lab 09/08/13 0928 09/08/13 1202 09/08/13 1659 09/08/13 2053 09/09/13 0819 09/09/13 1146  GLUCAP 117* 173* 98 162* 108* 126*   Micro Results: Recent Results (from the past 240 hour(s))  CULTURE, BLOOD (ROUTINE X 2)     Status: None   Collection Time    09/08/13  4:50 PM      Result Value Ref Range Status   Specimen Description BLOOD RIGHT ANTECUBITAL   Final   Special Requests BOTTLES DRAWN AEROBIC AND ANAEROBIC 10CC   Final   Culture  Setup Time     Final   Value: 09/08/2013 21:28     Performed at [facility]   Culture     Final   Value:        BLOOD CULTURE RECEIVED NO GROWTH TO DATE CULTURE WILL BE HELD FOR 5 DAYS BEFORE ISSUING A FINAL NEGATIVE REPORT     Performed at [facility]   Report Status PENDING   Incomplete  CULTURE, BLOOD (ROUTINE X 2)     Status: None   Collection Time    09/08/13  5:00 PM      Result Value Ref Range Status  Specimen Description BLOOD RIGHT HAND   Final   Special Requests BOTTLES DRAWN AEROBIC AND ANAEROBIC 10CC   Final   Culture  Setup Time     Final   Value: 09/08/2013 21:28     Performed at [facility]   Culture     Final   Value:        BLOOD CULTURE RECEIVED NO GROWTH TO DATE CULTURE WILL BE HELD FOR 5 DAYS BEFORE ISSUING A FINAL NEGATIVE REPORT     Performed at [facility]   Report Status PENDING   Incomplete   Studies/Results: Mr Hip Right W Wo Contrast  09/08/2013   CLINICAL DATA:  Right hip and leg pain.  EXAM: MRI OF THE RIGHT FEMUR WITHOUT AND WITH CONTRAST; MRI OF THE RIGHT HIP WITHOUT AND WITH CONTRAST  TECHNIQUE: Multiplanar, multisequence MR imaging of the lower right extremity was performed both before and after administration of intravenous contrast.; Multiplanar,  multisequence MR imaging was performed both before and after administration of intravenous contrast.  CONTRAST:  5m MULTIHANCE GADOBENATE DIMEGLUMINE 529 MG/ML IV SOLN  COMPARISON:  08/28/2013  FINDINGS: RIGHT HIP MRI:  Abnormal edema in the right iliopsoas muscle, images 6 through 39 of series 4, extending to the iliopsoas insertion site on the lesser trochanter. There is an associated abnormal fluid collection in the distal iliopsoas with mildly irregular enhancing margins, fluid collection measuring 2.6 x 5.3 by 1.6 cm. This is visible on image 27 of series 19.  A small amount of edema tracks within and along the right vastus intermedius and vastus lateralis muscles anteriorly as shown on image 37 of series 4, and there is also low-grade edema tracking along the right adductor minimus inferiorly, image 38 series 4.  Proximal hamstring tendinopathy. Pubic bodies and adjacent aponeurotic attachments intact. No significant hip joint effusion or abnormal regional osseous edema. No sciatic notch impingement or sciatic nerve asymmetry.  RIGHT UPPER LEG MRI:  Right total knee prosthesis obscures findings in the vicinity of the rib right knee.  In addition to the above-described findings, there is abnormal edema and enhancement tracking along the anterior superficial fascia margins of the anterior compartment of the thigh , particularly along the anterior margins of the rectus femoris and vastus lateralis as shown on image 22 of series 14. There is likely some faint edema within the rectus femoris and vastus lateralis muscles anteriorly. No additional drainable fluid collection is identified.  There is some patchy asymmetric marrow edema in the distal diaphysis and distal metaphysis of the right femur shown on images 17-18 of series 12 and image 16 of series 21. My impression on the axial images is that there is some associated contrast enhancement. No radiographic abnormality was visible in this vicinity on  09/06/2013.  IMPRESSION: 1. Small but irregular right iliopsoas fluid collection with enhancing margins and surrounding myositis. Appearance compatible with right iliopsoas distal abscess. Mild myositis involving the right vastus intermedius, rectus femoris vastus lateralis, and right adductor minimus muscles. Potential low-grade fasciitis along these muscle margins. 2. Abnormal patchy marrow edema and enhancement distally in the femoral diaphysis and metaphysis, suspicious for early osteomyelitis.   Electronically Signed   By: WSherryl BartersM.D.   On: 09/08/2013 09:36   Mr Femur Right W Wo Contrast  09/08/2013   CLINICAL DATA:  Right hip and leg pain.  EXAM: MRI OF THE RIGHT FEMUR WITHOUT AND WITH CONTRAST; MRI OF THE RIGHT HIP WITHOUT AND WITH CONTRAST  TECHNIQUE: Multiplanar,  multisequence MR imaging of the lower right extremity was performed both before and after administration of intravenous contrast.; Multiplanar, multisequence MR imaging was performed both before and after administration of intravenous contrast.  CONTRAST:  69m MULTIHANCE GADOBENATE DIMEGLUMINE 529 MG/ML IV SOLN  COMPARISON:  08/28/2013  FINDINGS: RIGHT HIP MRI:  Abnormal edema in the right iliopsoas muscle, images 6 through 39 of series 4, extending to the iliopsoas insertion site on the lesser trochanter. There is an associated abnormal fluid collection in the distal iliopsoas with mildly irregular enhancing margins, fluid collection measuring 2.6 x 5.3 by 1.6 cm. This is visible on image 27 of series 19.  A small amount of edema tracks within and along the right vastus intermedius and vastus lateralis muscles anteriorly as shown on image 37 of series 4, and there is also low-grade edema tracking along the right adductor minimus inferiorly, image 38 series 4.  Proximal hamstring tendinopathy. Pubic bodies and adjacent aponeurotic attachments intact. No significant hip joint effusion or abnormal regional osseous edema. No sciatic  notch impingement or sciatic nerve asymmetry.  RIGHT UPPER LEG MRI:  Right total knee prosthesis obscures findings in the vicinity of the rib right knee.  In addition to the above-described findings, there is abnormal edema and enhancement tracking along the anterior superficial fascia margins of the anterior compartment of the thigh , particularly along the anterior margins of the rectus femoris and vastus lateralis as shown on image 22 of series 14. There is likely some faint edema within the rectus femoris and vastus lateralis muscles anteriorly. No additional drainable fluid collection is identified.  There is some patchy asymmetric marrow edema in the distal diaphysis and distal metaphysis of the right femur shown on images 17-18 of series 12 and image 16 of series 21. My impression on the axial images is that there is some associated contrast enhancement. No radiographic abnormality was visible in this vicinity on 09/06/2013.  IMPRESSION: 1. Small but irregular right iliopsoas fluid collection with enhancing margins and surrounding myositis. Appearance compatible with right iliopsoas distal abscess. Mild myositis involving the right vastus intermedius, rectus femoris vastus lateralis, and right adductor minimus muscles. Potential low-grade fasciitis along these muscle margins. 2. Abnormal patchy marrow edema and enhancement distally in the femoral diaphysis and metaphysis, suspicious for early osteomyelitis.   Electronically Signed   By: WSherryl BartersM.D.   On: 09/08/2013 09:36   Ct Aspiration  09/09/2013   CLINICAL DATA:  Right iliopsoas abscess  EXAM: CT GUIDED ASPIRATION BIOPSY OF RIGHT ILIOPSOAS ABSCESS  ANESTHESIA/SEDATION: One  Mg IV Versed; 25 mcg IV Fentanyl  Total Moderate Sedation Time: 10 minutes.  PROCEDURE: The procedure risks, benefits, and alternatives were explained to the patient. Questions regarding the procedure were encouraged and answered. The patient understands and consents to the  procedure.  The right groin was prepped with Betadinein a sterile fashion, and a sterile drape was applied covering the operative field. A sterile gown and sterile gloves were used for the procedure. Local anesthesia was provided with 1% Lidocaine.  Under CT guidance, an 18 gauge needle was inserted into the right iliopsoas abscess. Two drops sanguinous fluid was aspirated.  Complications: None  FINDINGS: Images document needle placement in the right iliopsoas abscess.  IMPRESSION: Successful right ileus psoas for abscess aspiration. The samples were sent for culture.   Electronically Signed   By: AMaryclare BeanM.D.   On: 09/09/2013 10:21   Medications: I have reviewed the patient's current medications. Scheduled Meds: .  allopurinol  300 mg Oral Daily  . aspirin EC  81 mg Oral Daily  . [START ON 09/10/2013] clopidogrel  75 mg Oral Daily  . fentaNYL      . gabapentin  1,200 mg Oral 3 times per day  . heparin  5,000 Units Subcutaneous 3 times per day  . insulin aspart  0-9 Units Subcutaneous TID WC  . lidocaine      . lisinopril  20 mg Oral Daily  . midazolam      . pantoprazole  40 mg Oral Daily  . 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 iliac artery   Tobacco abuse disorder   Spinal stenosis of lumbar region   Hyperlipidemia   Cellulitis   PAD (peripheral artery disease)   Right leg swelling  Right knee/hip pain likely from R ilopsoas abscess with myositis and fasciatitis  -MRI right hi;/femur oday revealed right iliopsoas abscess with surrounding myositis. Potential low grade fasciatis. Also possible early osteomyelitis on femoral diaphysis  and metaphysis.   -CRP and ESR low. [place]. CT guided abscess aspiration culture pending. - will start abx coverage with Vanc + ceftriaxone.  - oxycodone IR 23m q6hrn PRN for pain.  Right foot swelling - improved with compression stocking unclear etiology. Low suspicion of cellulitis since he has no systemic signs of infection, there is no obvious local signs of infection. R foot slightly warm and more edematous than left. However, given his severe PAD and lack of tenderness of the foot the picture is complicated. It points to other possible causes. He has worsening arterial dz appears to have patent stents that were placed in the past. Ext is not cool or blue.  arterial duplex: right EIA stent patent. Right SFA known occlusion. Right calf runoff. On 03/10/13 - had highly calcified right SFA, stented right external iliac artery. On Jan 2015 - stent of left external iliac artery with Dr. BAlvester Chou  DVT less likely given negative doppler on 09/02/13. Consider gouty arthritis as one of the differential given hx of gout. Is on allopurinol at home.  - xray of right hip, knee, ankle, and foot all normal except for calcifications.  - improving with compression stocking. Took off stocking today, will see how he does tomorrow.  PAD s/p stent on R and L Ext Iliac arteries  - Cont plavix, zocor, asa  - arterial doppler shows patent stents and at baseline. - Already has outpatient f/up with Dr. BAlvester Choufor later this month.   HTN - elevated -likely 2/2 to pain - cont lisinopril 20 mg PO daily here for now. Might increase to 40 mg if needed.   DM II  - well controlled. Hgba1c 6.7 02/03/13.  - hold home glipizide for now.  - SSI sensitive and CBG monitoring.   PPx: protonix + heparin  Code: DNR. Fully discussed with patient at the time of admission. He is fully aware of the decision and seems to have capacity to make his own decisions.  Diet: carb mod.  Dispo: Disposition is deferred at this time, awaiting  improvement of current medical problems.  Anticipated discharge in approximately 2-3 day(s).   The patient  does have a current PCP Red[facility] Pulling Arna Medici, MD) and does need an [facility] hospital follow-up appointment after discharge.  The patient does not know have transportation limitations that hinder transportation to clinic appointments.  .Services Needed at time of discharge: Y = Yes, Blank = No PT:   OT:   RN:   Equipment:   Other:     LOS: 3 days   Dellia Nims, MD 09/09/2013, 1:16 PM

## 2013-09-09 NOTE — Progress Notes (Signed)
  Date: 09/09/2013  Patient name: Marc [name]  Medical record number: [id number]  Date of birth: 05-20-1940   This patient has been seen and the plan of care was discussed with the house staff. Please see their note for complete details. I concur with their findings with the following additions/corrections:  Will provide follow up on aspirate cutlure. Will start him on ceftriaxone and vancomycin for empiric coverage. Still not having optimal pain control. Thus will titrate meds so that he is comfortable to work with pt  Carlyle Basques, MD 09/09/2013, 5:55 PM

## 2013-09-09 NOTE — Procedures (Signed)
R iliopsoas abscess aspiration Two drops sanguinous No comp

## 2013-09-10 DIAGNOSIS — I739 Peripheral vascular disease, unspecified: Secondary | ICD-10-CM

## 2013-09-10 LAB — GLUCOSE, CAPILLARY
Glucose-Capillary: 114 mg/dL — ABNORMAL HIGH (ref 70–99)
Glucose-Capillary: 146 mg/dL — ABNORMAL HIGH (ref 70–99)

## 2013-09-10 LAB — CBC WITH DIFFERENTIAL/PLATELET
Basophils Absolute: 0.1 10*3/uL (ref 0.0–0.1)
Basophils Relative: 1 % (ref 0–1)
EOS ABS: 0.6 10*3/uL (ref 0.0–0.7)
Eosinophils Relative: 9 % — ABNORMAL HIGH (ref 0–5)
HCT: 40 % (ref 39.0–52.0)
Hemoglobin: 13.1 g/dL (ref 13.0–17.0)
Lymphocytes Relative: 36 % (ref 12–46)
Lymphs Abs: 2.4 10*3/uL (ref 0.7–4.0)
MCH: 32 pg (ref 26.0–34.0)
MCHC: 32.8 g/dL (ref 30.0–36.0)
MCV: 97.6 fL (ref 78.0–100.0)
Monocytes Absolute: 0.4 10*3/uL (ref 0.1–1.0)
Monocytes Relative: 7 % (ref 3–12)
Neutro Abs: 3.2 10*3/uL (ref 1.7–7.7)
Neutrophils Relative %: 47 % (ref 43–77)
PLATELETS: 183 10*3/uL (ref 150–400)
RBC: 4.1 MIL/uL — ABNORMAL LOW (ref 4.22–5.81)
RDW: 14.2 % (ref 11.5–15.5)
WBC: 6.7 10*3/uL (ref 4.0–10.5)

## 2013-09-10 LAB — BASIC METABOLIC PANEL
Anion gap: 12 (ref 5–15)
BUN: 27 mg/dL — ABNORMAL HIGH (ref 6–23)
CO2: 25 mEq/L (ref 19–32)
Calcium: 9.1 mg/dL (ref 8.4–10.5)
Chloride: 105 mEq/L (ref 96–112)
Creatinine, Ser: 1.26 mg/dL (ref 0.50–1.35)
GFR, EST AFRICAN AMERICAN: 64 mL/min — AB (ref >90)
GFR, EST NON AFRICAN AMERICAN: 55 mL/min — AB (ref >90)
GLUCOSE: 116 mg/dL — AB (ref 70–99)
Potassium: 4.8 mEq/L (ref 3.7–5.3)
Sodium: 142 mEq/L (ref 137–147)

## 2013-09-10 LAB — PROCALCITONIN: Procalcitonin: <0.1 ng/mL

## 2013-09-10 MED ORDER — OXYCODONE HCL 10 MG PO TABS: 10.0000 mg | ORAL_TABLET | Freq: Four times a day (QID) | ORAL | Status: DC | PRN

## 2013-09-10 MED ORDER — SULFAMETHOXAZOLE-TMP DS 800-160 MG PO TABS
1.0000 | ORAL_TABLET | Freq: Two times a day (BID) | ORAL | Status: DC
Start: 2013-09-10 — End: 2013-09-10

## 2013-09-10 MED ORDER — DOXYCYCLINE HYCLATE 50 MG PO CAPS: 100.0000 mg | ORAL_CAPSULE | Freq: Two times a day (BID) | ORAL | Status: DC

## 2013-09-10 NOTE — Discharge Summary (Signed)
Name: Marc [name] MRN: [id number] DOB: May 26, 1940 73 y.o. 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 iliac artery   Tobacco abuse disorder   Spinal stenosis of lumbar region   Hyperlipidemia   Cellulitis   PAD (peripheral artery disease)   Right leg swelling  Discharge Medications:   Medication List    STOP taking these medications       HYDROcodone-acetaminophen 5-325 MG per tablet  Commonly known as:  NORCO/VICODIN     lovastatin 40 MG tablet  Commonly known as:  MEVACOR     predniSONE 10 MG tablet  Commonly known as:  DELTASONE      TAKE these medications       allopurinol 300 MG tablet  Commonly known as:  ZYLOPRIM  Take 300 mg by mouth daily.     aspirin EC 325 MG tablet  Take 1 tablet (325 mg total) by mouth 2 (two) times daily.     atorvastatin 10 MG tablet  Commonly known as:  LIPITOR  Take 10 mg by mouth daily.     clopidogrel 75 MG tablet  Commonly known as:  PLAVIX  Take 1 tablet (75 mg total) by mouth daily. <please schedule appointment for future refills>     doxycycline 50 MG capsule  Commonly known as:  VIBRAMYCIN  Take 2 capsules (100 mg total) by mouth 2 (two) times daily.     furosemide 80 MG tablet  Commonly known as:  LASIX  Take 40 mg by mouth daily.     gabapentin 600 MG tablet  Commonly known as:  NEURONTIN  Take 1,200 mg by mouth every 8 (eight) hours.     glimepiride 4 MG tablet  Commonly known as:  AMARYL  Take 4 mg by mouth 2 (two) times daily.     lisinopril 20 MG tablet  Commonly known as:  PRINIVIL,ZESTRIL    Take 20 mg by mouth daily.     omeprazole 20 MG tablet  Commonly known as:  PRILOSEC OTC  Take 40 mg by mouth daily.     Oxycodone HCl 10 MG Tabs  Take 1 tablet (10 mg total) by mouth every 6 (six) hours as needed for severe pain.        Disposition and follow-up:   [name] was discharged from [facility] in Stable condition.  At the hospital follow up visit please address:  1.  Please assess pain control and discuss physical therapy.  2.  Labs / imaging needed at time of follow-up: Please follow up on the blood culture and CT guided aspiration cultures. It has been NGTD so far here.  3.  Pending labs/ test needing follow-up:  Please follow up on the blood culture and CT guided aspiration cultures. It has been NGTD so far here.  Follow-up Appointments:  With PCP something next week. Dr. Claris [name].   Discharge Instructions:     Discharge Instructions   Increase activity slowly    Complete by:  As directed            Consultations:  Procedures Performed:  Dg Hip Complete Right  09/06/2013   CLINICAL DATA:  Right lower extremity pain across all joints and foot 2 weeks.  EXAM: RIGHT HIP - COMPLETE 2+ VIEW  COMPARISON:  None.  FINDINGS: There are mild symmetric degenerative changes of the hips. There is no acute fracture or dislocation. There are mild degenerative changes over the symphysis pubis joint. Fusion hardware is present over the lower lumbar spine. Vascular stents are seen over the pelvis bilaterally. There are several left pelvic phleboliths.  IMPRESSION: No acute findings.   Electronically Signed   By: Marin Olp M.D.   On: 09/06/2013 16:55   Dg Ankle Complete Right  09/06/2013   CLINICAL DATA:  73 year old male with pain radiating to the right lower extremity and increasing. Initial encounter.  EXAM: RIGHT ANKLE - COMPLETE 3+ VIEW  COMPARISON:  Right foot series from the same day reported separately.  FINDINGS: Extensive  chronic appearing irregular calcification about the medial and lateral malleoli. Mortise joint alignment preserved. Talar dome appears intact. No joint effusion. Calcaneus intact with degenerative spurring. No acute fracture or dislocation. Calcified atherosclerosis.  IMPRESSION: Extensive chronic changes about the medial and lateral malleoli. No acute osseous abnormality identified at the right ankle.   Electronically Signed   By: Lars Pinks M.D.   On: 09/06/2013 16:57   Mr Hip Right W Wo Contrast  09/08/2013   CLINICAL DATA:  Right hip and leg pain.  EXAM: MRI OF THE RIGHT FEMUR WITHOUT AND WITH CONTRAST; MRI OF THE RIGHT HIP WITHOUT AND WITH CONTRAST  TECHNIQUE: Multiplanar, multisequence MR imaging of the lower right extremity was performed both before and after administration of intravenous contrast.; Multiplanar, multisequence MR imaging was performed both before and after administration of intravenous contrast.  CONTRAST:  39m MULTIHANCE GADOBENATE DIMEGLUMINE 529 MG/ML IV SOLN  COMPARISON:  08/28/2013  FINDINGS: RIGHT HIP MRI:  Abnormal edema in the right iliopsoas muscle, images 6 through 39 of series 4, extending to the iliopsoas insertion site on the lesser trochanter. There is an associated abnormal fluid collection in the distal iliopsoas with mildly irregular enhancing margins, fluid collection measuring 2.6 x 5.3 by 1.6 cm. This is visible on image 27 of series 19.  A small amount of edema tracks within and along the right vastus intermedius and vastus lateralis muscles anteriorly as shown on image 37 of series 4, and there is also low-grade edema tracking along the right adductor minimus inferiorly, image 38 series 4.  Proximal hamstring tendinopathy. Pubic bodies and adjacent aponeurotic attachments intact. No significant hip joint effusion or abnormal regional osseous edema. No sciatic notch impingement or sciatic nerve asymmetry.  RIGHT UPPER LEG MRI:  Right total knee prosthesis obscures  findings in the vicinity of the rib right knee.  In addition to the above-described findings, there is abnormal edema and enhancement tracking along the anterior superficial fascia margins of the anterior compartment of the thigh , particularly along the anterior margins of the rectus femoris and vastus lateralis as shown on image 22 of series 14. There is likely some faint edema within the rectus femoris and vastus lateralis muscles anteriorly. No additional drainable fluid collection is identified.  There is some patchy asymmetric marrow edema in the distal diaphysis and distal metaphysis of the right femur shown on images 17-18 of series 12 and image 16 of series 21. My impression on the axial images is that there is some associated contrast enhancement. No radiographic abnormality was visible in this  vicinity on 09/06/2013.  IMPRESSION: 1. Small but irregular right iliopsoas fluid collection with enhancing margins and surrounding myositis. Appearance compatible with right iliopsoas distal abscess. Mild myositis involving the right vastus intermedius, rectus femoris vastus lateralis, and right adductor minimus muscles. Potential low-grade fasciitis along these muscle margins. 2. Abnormal patchy marrow edema and enhancement distally in the femoral diaphysis and metaphysis, suspicious for early osteomyelitis.   Electronically Signed   By: Sherryl Barters M.D.   On: 09/08/2013 09:36   Mr Femur Right W Wo Contrast  09/08/2013   CLINICAL DATA:  Right hip and leg pain.  EXAM: MRI OF THE RIGHT FEMUR WITHOUT AND WITH CONTRAST; MRI OF THE RIGHT HIP WITHOUT AND WITH CONTRAST  TECHNIQUE: Multiplanar, multisequence MR imaging of the lower right extremity was performed both before and after administration of intravenous contrast.; Multiplanar, multisequence MR imaging was performed both before and after administration of intravenous contrast.  CONTRAST:  58m MULTIHANCE GADOBENATE DIMEGLUMINE 529 MG/ML IV SOLN   COMPARISON:  08/28/2013  FINDINGS: RIGHT HIP MRI:  Abnormal edema in the right iliopsoas muscle, images 6 through 39 of series 4, extending to the iliopsoas insertion site on the lesser trochanter. There is an associated abnormal fluid collection in the distal iliopsoas with mildly irregular enhancing margins, fluid collection measuring 2.6 x 5.3 by 1.6 cm. This is visible on image 27 of series 19.  A small amount of edema tracks within and along the right vastus intermedius and vastus lateralis muscles anteriorly as shown on image 37 of series 4, and there is also low-grade edema tracking along the right adductor minimus inferiorly, image 38 series 4.  Proximal hamstring tendinopathy. Pubic bodies and adjacent aponeurotic attachments intact. No significant hip joint effusion or abnormal regional osseous edema. No sciatic notch impingement or sciatic nerve asymmetry.  RIGHT UPPER LEG MRI:  Right total knee prosthesis obscures findings in the vicinity of the rib right knee.  In addition to the above-described findings, there is abnormal edema and enhancement tracking along the anterior superficial fascia margins of the anterior compartment of the thigh , particularly along the anterior margins of the rectus femoris and vastus lateralis as shown on image 22 of series 14. There is likely some faint edema within the rectus femoris and vastus lateralis muscles anteriorly. No additional drainable fluid collection is identified.  There is some patchy asymmetric marrow edema in the distal diaphysis and distal metaphysis of the right femur shown on images 17-18 of series 12 and image 16 of series 21. My impression on the axial images is that there is some associated contrast enhancement. No radiographic abnormality was visible in this vicinity on 09/06/2013.  IMPRESSION: 1. Small but irregular right iliopsoas fluid collection with enhancing margins and surrounding myositis. Appearance compatible with right iliopsoas distal  abscess. Mild myositis involving the right vastus intermedius, rectus femoris vastus lateralis, and right adductor minimus muscles. Potential low-grade fasciitis along these muscle margins. 2. Abnormal patchy marrow edema and enhancement distally in the femoral diaphysis and metaphysis, suspicious for early osteomyelitis.   Electronically Signed   By: WSherryl BartersM.D.   On: 09/08/2013 09:36   Ct Aspiration  09/09/2013   CLINICAL DATA:  Right iliopsoas abscess  EXAM: CT GUIDED ASPIRATION BIOPSY OF RIGHT ILIOPSOAS ABSCESS  ANESTHESIA/SEDATION: One  Mg IV Versed; 25 mcg IV Fentanyl  Total Moderate Sedation Time: 10 minutes.  PROCEDURE: The procedure risks, benefits, and alternatives were explained to the patient. Questions regarding the procedure were encouraged and  answered. The patient understands and consents to the procedure.  The right groin was prepped with Betadinein a sterile fashion, and a sterile drape was applied covering the operative field. A sterile gown and sterile gloves were used for the procedure. Local anesthesia was provided with 1% Lidocaine.  Under CT guidance, an 18 gauge needle was inserted into the right iliopsoas abscess. Two drops sanguinous fluid was aspirated.  Complications: None  FINDINGS: Images document needle placement in the right iliopsoas abscess.  IMPRESSION: Successful right ileus psoas for abscess aspiration. The samples were sent for culture.   Electronically Signed   By: Maryclare Bean M.D.   On: 09/09/2013 10:21   Dg Knee Complete 4 Views Right  09/06/2013   CLINICAL DATA:  Lower leg pain, prior knee replacement  EXAM: RIGHT KNEE - COMPLETE 4+ VIEW  COMPARISON:  02/06/2013  FINDINGS: Status post right knee arthroplasty. Components appear aligned. No acute osseous or hardware abnormality. No malalignment, fracture or joint effusion. Peripheral atherosclerosis noted.  IMPRESSION: Status post right knee arthroplasty. No acute finding by plain radiography.   Electronically  Signed   By: Daryll Brod M.D.   On: 09/06/2013 16:53   Dg Foot Complete Right  09/06/2013   CLINICAL DATA:  73 year old male with pain radiating to the right lower extremity and increasing. Initial encounter.  EXAM: RIGHT FOOT COMPLETE - 3+ VIEW  COMPARISON:  None.  FINDINGS: MTP joint subluxation. Bone mineralization is within normal limits for age. Calcaneus intact with degenerative spurring. Vascular calcifications in the right foot. No acute fracture or dislocation identified.  IMPRESSION: No acute fracture or dislocation identified about the right foot.   Electronically Signed   By: Lars Pinks M.D.   On: 09/06/2013 16:54   ABI completed:   RIGHT    LEFT     PRESSURE  WAVEFORM   PRESSURE  WAVEFORM   BRACHIAL  172  triphasic  BRACHIAL  180  triphasic   DP    DP     AT  73  Dampened monophasic  AT  99  Dampened monophasic   PT  82  Dampened monophasic  PT  98  Dampened monophasic   PER    PER     GREAT TOE   NA  GREAT TOE   NA     RIGHT  LEFT   ABI  0.46  0.55   Duplex imaging: Right: 50-99% stenosis in the mid femoral artery. External iliac stent appears patent. Left: Proximal Femoral artery appears occluded with reconstitution in the popliteal artery.   Admission HPI:   73 yo male with hx of HTN, gout, bradycardia, DM II, PAD s/p stent R ext iliac artery, and hx of recent cellulitis here with increased right foot swelling for few days and also right knee/hip pain for 3 weeks. He had a right knee replacement done on 01/2013, was doing well after that. 3 weeks ago he started having pain on the right leg. Pain comes with touching his knee and it's severe that shoots up to the hip. Denies prolonged statis, fever, chills.  He has wound on right foot that has been healing well. Was on doxy for 1 month, finished 2 weeks ago. No puss or drainage.  He also has pain on the left lateral chest wall for 3 weeks which his PCP thought was shingles. He hasn't seen a rash. Pain is worse with palpation  and movement. No cough, sob, substernal chest pain.    Hospital Course  by problem list:   Right knee/hip pain likely from R ilopsoas abscess with myositis and fasciatitis  -MRI right hi;/femur oday revealed right iliopsoas abscess with surrounding myositis. Potential low grade fasciatis. Also possible early osteomyelitis on femoral diaphysis and metaphysis.  -CRP and ESR low. Procalcitonin low. Harper. CT guided abscess aspiration culture pending. - treated with one day of Vanc + ceftriaxone. Will switch to doxycycline for outpatient abx treatment for 1 week. - oxycodone IR $RemoveBefo'10mg'[id number]$  [id number] PRN for pain. Will have him f/up with PCP pain control - refuses PT. Asked him to discuss with PCP for PT outpatient for his leg pain. Described to him that it's not good to not move his leg as it can lead to deconditioning.  Right foot swelling - improved with compression stocking unclear etiology. Low suspicion of cellulitis since he has no systemic signs of infection, there is no obvious local signs of infection. R foot slightly warm and more edematous than left. However, given his severe PAD and lack of tenderness of the foot the picture is complicated. It points to other possible causes. He has worsening arterial dz appears to have patent stents that were placed in the past. Ext is not cool or blue.  arterial duplex: right EIA stent patent. Right SFA known occlusion. Right calf runoff. On 03/10/13 - had highly calcified right SFA, stented right external iliac artery. On Jan 2015 - stent of left external iliac artery with Dr. Alvester Chou.  DVT less likely given negative doppler on 09/02/13.  - xray of right hip, knee, ankle, and foot all normal except for calcifications.  - improved with compression stocking. Appears to be resolving. No sign of infection.  PAD s/p stent on R and L Ext Iliac arteries  - Cont plavix, zocor, asa  - arterial doppler shows patent stents and at baseline. - Already has outpatient f/up with  Dr. Alvester Chou for later this month.   HTN - elevated -likely 2/2 to pain - was on lisinopril 20 mg PO daily here for now. Will restart home $RemoveBefo'40mg'SYwdgnMeOpS$  upon discharge.  DM II  - well controlled. Hgba1c 6.7 02/03/13.  - held home glipizide here, was on SSI sensitive and CBG monitoring. Will restart glipizide on discharge.   Discharge Vitals:   BP 146/73  Pulse 50  Temp(Src) 97.4 F (36.3 C) (Oral)  Resp 16  Ht $R'6\' 1"'av$  (1.854 m)  Wt 92.534 kg (204 lb)  BMI 26.92 kg/m2  SpO2 98%  Discharge Labs:  Results for orders placed during the hospital encounter of 09/06/13 (from the past 24 hour(s))  GLUCOSE, CAPILLARY     Status: Abnormal   Collection Time    09/09/13  5:54 PM      Result Value Ref Range   Glucose-Capillary 129 (*) 70 - 99 mg/dL  GLUCOSE, CAPILLARY     Status: Abnormal   Collection Time    09/09/13 10:11 PM      Result Value Ref Range   Glucose-Capillary 149 (*) 70 - 99 mg/dL  CBC WITH DIFFERENTIAL     Status: Abnormal   Collection Time    09/10/13  5:03 AM      Result Value Ref Range   WBC 6.7  4.0 - 10.5 K/uL   RBC 4.10 (*) 4.22 - 5.81 MIL/uL   Hemoglobin 13.1  13.0 - 17.0 g/dL   HCT 40.0  39.0 - 52.0 %   MCV 97.6  78.0 - 100.0 fL   MCH 32.0  26.0 - 34.0  pg   MCHC 32.8  30.0 - 36.0 g/dL   RDW 14.2  11.5 - 15.5 %   Platelets 183  150 - 400 K/uL   Neutrophils Relative % 47  43 - 77 %   Neutro Abs 3.2  1.7 - 7.7 K/uL   Lymphocytes Relative 36  12 - 46 %   Lymphs Abs 2.4  0.7 - 4.0 K/uL   Monocytes Relative 7  3 - 12 %   Monocytes Absolute 0.4  0.1 - 1.0 K/uL   Eosinophils Relative 9 (*) 0 - 5 %   Eosinophils Absolute 0.6  0.0 - 0.7 K/uL   Basophils Relative 1  0 - 1 %   Basophils Absolute 0.1  0.0 - 0.1 K/uL  BASIC METABOLIC PANEL     Status: Abnormal   Collection Time    09/10/13  5:03 AM      Result Value Ref Range   Sodium 142  137 - 147 mEq/L   Potassium 4.8  3.7 - 5.3 mEq/L   Chloride 105  96 - 112 mEq/L   CO2 25  19 - 32 mEq/L   Glucose, Bld 116 (*) 70 - 99  mg/dL   BUN 27 (*) 6 - 23 mg/dL   Creatinine, Ser 1.26  0.50 - 1.35 mg/dL   Calcium 9.1  8.4 - 10.5 mg/dL   GFR calc non Af Amer 55 (*) >90 mL/min   GFR calc Af Amer 64 (*) >90 mL/min   Anion gap 12  5 - 15  GLUCOSE, CAPILLARY     Status: Abnormal   Collection Time    09/10/13  7:55 AM      Result Value Ref Range   Glucose-Capillary 114 (*) 70 - 99 mg/dL   Comment 1 Notify RN    PROCALCITONIN     Status: None   Collection Time    09/10/13  9:05 AM      Result Value Ref Range   Procalcitonin <0.10    GLUCOSE, CAPILLARY     Status: Abnormal   Collection Time    09/10/13 12:03 PM      Result Value Ref Range   Glucose-Capillary 146 (*) 70 - 99 mg/dL   Comment 1 Notify RN      Signed: Dellia Nims, MD 09/10/2013, 2:45 PM    Services Ordered on Discharge:  Equipment Ordered on Discharge:

## 2013-09-10 NOTE — Progress Notes (Signed)
AVS discharge instructions were given and went over with patient. Patient was also given prescription for oxycodone to take to his pharmacy and was told that that he had a prescription for doxcycline to pick up from his pharmacy. Patient that he did not have any questions. Staff assisted patient to his transportation.

## 2013-09-10 NOTE — Progress Notes (Signed)
  Date: 09/10/2013  Patient name: Marc [name]  Medical record number: [id number]  Date of birth: 1940-03-16   This patient has been seen and the plan of care was discussed with the house staff. Please see their note for complete details. I concur with their findings with the following additions/corrections:  Appears to have some improvement with pain management. Blood work reveals no leukocytosis, inflammatory marker and procalcitonin as being normal. Aspirate of iliopsoas muscle fluid collection show no organism on gram stain and a few drops of blood per report. Unclear if the fluid collection is truly an abscess or possibly just hematoma. He was empirically started on iv antibiotics, will change him to doxycycline 100mg  bid and discharge home today. If culture results reveal a different pathogen, we will change antibiotics. Primarily, the patient will need to follow up with pcp to ensure that his leg pain from myositis and fascitis is improving.  Carlyle Basques, MD 09/10/2013, 3:08 PM

## 2013-09-10 NOTE — Discharge Instructions (Signed)
It was a pleasure taking care of you. You were hospitalized for right leg pain. We found an area of fluid collection on your ilopsoas muscle which is likely causing your pain. You should follow up with your primary care physician for pain control. We have given you a short supply of pain medications.  You should consider/discuss doing physical therapy with your doctor.

## 2013-09-10 NOTE — Care Management (Signed)
IM given to patient . Magdalen Spatz RN BSN

## 2013-09-10 NOTE — Progress Notes (Signed)
Subjective:  Continues to have right sided groin pain and knee/hip pain with movement. It's not noticeable at rest. He doesn't feel like moving it. Has decreased swelling of the left foot today. Wants to go home. Have asked him to walk with the nurse which he was able to do without any problem.   Denies cp/sob/n/v/fever/chills.   Objective: Vital signs in last 24 hours: Filed Vitals:   09/09/13 2216 09/10/13 0522 09/10/13 1004 09/10/13 1423  BP: 152/60 167/73 128/58 146/73  Pulse: 52 49  50  Temp: 97.7 F (36.5 C) 97.6 F (36.4 C)  97.4 F (36.3 C)  TempSrc: Oral Oral  Oral  Resp: _0 Height:      Weight:      SpO2: 96% 97%  98%   Weight change:   Intake/Output Summary (Last 24 hours) at 09/10/13 2014 Last data filed at 09/10/13 1300  Gross per 24 hour  Intake    600 ml  Output    940 ml  Net   -340 ml    Vitals reviewed. General: sitting in bed, NAD HEENT: PERRL, EOMI, no scleral icterus Cardiac: RRR, no rubs, murmurs or gallops Pulm: clear to auscultation bilaterally, no wheezes, rales, or rhonchi Abd: soft, nontender, nondistended, BS present Ext:   Right foot non tender, nonerythematous. Has swelling on dorsal surface but decreased significantly after compression socks.  There are 2 skin lesions around the ankle joints medially and laterally, both well healed without discharge.   Right knee TTP anterior suprapatellar region radiating to the thigh.   Neuro: alert and oriented X3, cranial nerves II-XII grossly intact, strength and sensation to light touch equal in bilateral upper and lower extremities  Lab Results: Basic Metabolic Panel:  Recent Labs Lab 09/06/13 0843 09/10/13 0503  NA 145 142  K 4.8 4.8  CL 105 105  CO2 28 25  GLUCOSE 206* 116*  BUN 23 27*  CREATININE 1.34 1.26  CALCIUM 9.4 9.1   Liver Function Tests: No results found for this basename: AST, ALT, ALKPHOS, BILITOT, PROT, ALBUMIN,  in the last 168 hours No results found for  this basename: LIPASE, AMYLASE,  in the last 168 hours No results found for this basename: AMMONIA,  in the last 168 hours CBC:  Recent Labs Lab 09/06/13 0843 09/10/13 0503  WBC 6.9 6.7  NEUTROABS 4.5 3.2  HGB 14.1 13.1  HCT 43.0 40.0  MCV 96.4 97.6  PLT 201 183   Cardiac Enzymes: No results found for this basename: CKTOTAL, CKMB, CKMBINDEX, TROPONINI,  in the last 168 hours BNP: No results found for this basename: PROBNP,  in the last 168 hours D-Dimer: No results found for this basename: DDIMER,  in the last 168 hours CBG:  Recent Labs Lab 09/09/13 0819 09/09/13 1146 09/09/13 1754 09/09/13 2211 09/10/13 0755 09/10/13 1203  GLUCAP 108* 126* 129* 149* 114* 146*   Micro Results: Recent Results (from the past 240 hour(s))  CULTURE, BLOOD (ROUTINE X 2)     Status: None   Collection Time    09/08/13  4:50 PM      Result Value Ref Range Status   Specimen Description BLOOD RIGHT ANTECUBITAL   Final   Special Requests BOTTLES DRAWN AEROBIC AND ANAEROBIC 10CC   Final   Culture  Setup Time     Final   Value: 09/08/2013 21:28     Performed at [facility]     Final  Value:        BLOOD CULTURE RECEIVED NO GROWTH TO DATE CULTURE WILL BE HELD FOR 5 DAYS BEFORE ISSUING A FINAL NEGATIVE REPORT     Performed at [facility]   Report Status PENDING   Incomplete  CULTURE, BLOOD (ROUTINE X 2)     Status: None   Collection Time    09/08/13  5:00 PM      Result Value Ref Range Status   Specimen Description BLOOD RIGHT HAND   Final   Special Requests BOTTLES DRAWN AEROBIC AND ANAEROBIC 10CC   Final   Culture  Setup Time     Final   Value: 09/08/2013 21:28     Performed at [facility]   Culture     Final   Value:        BLOOD CULTURE RECEIVED NO GROWTH TO DATE CULTURE WILL BE HELD FOR 5 DAYS BEFORE ISSUING A FINAL NEGATIVE REPORT     Performed at [facility]   Report Status PENDING   Incomplete  CULTURE, ROUTINE-ABSCESS     Status:  None   Collection Time    09/09/13  9:51 AM      Result Value Ref Range Status   Specimen Description ABSCESS   Final   Special Requests R ILIOPSOAS ABSCESS   Final   Gram Stain     Final   Value: NO WBC SEEN     NO SQUAMOUS EPITHELIAL CELLS SEEN     NO ORGANISMS SEEN     Performed at [facility]   Culture     Final   Value: NO GROWTH 1 DAY     Performed at [facility]   Report Status PENDING   Incomplete   Studies/Results: Ct Aspiration  09/09/2013   CLINICAL DATA:  Right iliopsoas abscess  EXAM: CT GUIDED ASPIRATION BIOPSY OF RIGHT ILIOPSOAS ABSCESS  ANESTHESIA/SEDATION: One  Mg IV Versed; 25 mcg IV Fentanyl  Total Moderate Sedation Time: 10 minutes.  PROCEDURE: The procedure risks, benefits, and alternatives were explained to the patient. Questions regarding the procedure were encouraged and answered. The patient understands and consents to the procedure.  The right groin was prepped with Betadinein a sterile fashion, and a sterile drape was applied covering the operative field. A sterile gown and sterile gloves were used for the procedure. Local anesthesia was provided with 1% Lidocaine.  Under CT guidance, an 18 gauge needle was inserted into the right iliopsoas abscess. Two drops sanguinous fluid was aspirated.  Complications: None  FINDINGS: Images document needle placement in the right iliopsoas abscess.  IMPRESSION: Successful right ileus psoas for abscess aspiration. The samples were sent for culture.   Electronically Signed   By: Maryclare Bean M.D.   On: 09/09/2013 10:21   Medications: I have reviewed the patient's current medications. Scheduled Meds:  Continuous Infusions: PRN Meds:.   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 iliac artery   Tobacco abuse disorder   Spinal stenosis of lumbar region   Hyperlipidemia   Cellulitis   PAD (peripheral artery disease)   Right leg swelling  Right knee/hip pain likely from R ilopsoas abscess with myositis and fasciatitis  -MRI right hi;/femur oday  revealed right iliopsoas abscess with surrounding myositis. Potential low grade fasciatis. Also possible early osteomyelitis on femoral diaphysis and metaphysis.   -CRP and ESR low. [place]. CT guided abscess aspiration culture pending. - treated with one day of Vanc + ceftriaxone. Will switch to doxycycline for outpatient abx treatment for 1 week. - oxycodone IR 53m q6hrn PRN for pain. Will have him f/up with PCP pain control - refuses PT. Asked him to discuss with PCP for PT outpatient for his leg pain. Described to him that it's not good to not move his leg as it can lead to deconditioning.  Right foot swelling - improved with compression stocking unclear etiology. Low suspicion of cellulitis since he has no systemic signs of infection, there is no obvious local signs of infection. R foot slightly warm and more edematous than left. However, given his severe PAD and lack of tenderness of the foot the picture is complicated. It points to other possible causes. He has worsening arterial dz appears to have patent stents that were placed in the past. Ext is not cool or blue.  arterial duplex: right EIA stent patent. Right SFA known occlusion. Right calf runoff. On 03/10/13 - had highly calcified right SFA, stented right external iliac artery. On Jan 2015 - stent of left external iliac artery with Dr. BAlvester Chou  DVT less likely given negative doppler on 09/02/13.  - xray of right hip, knee, ankle, and foot all normal except for calcifications.  - improving with compression stocking. Appears to be resolving.  PAD s/p stent on R and L Ext Iliac arteries  - Cont plavix, zocor, asa  - arterial doppler shows patent  stents and at baseline. - Already has outpatient f/up with Dr. BAlvester Choufor later this month.   HTN - elevated -likely 2/2 to pain - cont lisinopril 20 mg PO daily here for now. Might increase to 40 mg if needed.  DM II  - well controlled. Hgba1c 6.7 02/03/13.  - hold home glipizide for now.  - SSI sensitive and CBG monitoring.   PPx: protonix + heparin  Code: DNR. Fully discussed with patient at the time of admission. He is fully aware of the decision and seems to have capacity to make his own decisions.  Diet: carb mod.  Dispo: Disposition is deferred at this time, awaiting improvement of current medical problems.  Anticipated discharge in approximately 2-3 day(s).   The patient does have a current PCP (Redmond PullingOArna Medici MD) and [facility].  The patient does not know have transportation limitations that hinder transportation to clinic appointments.  .Services Needed at time of discharge: Y = Yes, Blank = No PT:   OT:   RN:   Equipment:   Other:     LOS: 4 days   TDellia Nims MD 09/10/2013, 8:14 PM

## 2013-09-12 LAB — CULTURE, ROUTINE-ABSCESS
Culture: NO GROWTH
Gram Stain: NONE SEEN

## 2013-09-14 LAB — CULTURE, BLOOD (ROUTINE X 2)
CULTURE: NO GROWTH
Culture: NO GROWTH

## 2013-09-15 ENCOUNTER — Encounter: Payer: Self-pay | Admitting: *Deleted

## 2013-09-16 ENCOUNTER — Encounter: Payer: Self-pay | Admitting: *Deleted

## 2013-09-20 ENCOUNTER — Telehealth (HOSPITAL_COMMUNITY): Payer: Self-pay | Admitting: *Deleted

## 2013-09-20 ENCOUNTER — Ambulatory Visit: Payer: Medicare Other | Admitting: Cardiology

## 2013-10-20 ENCOUNTER — Other Ambulatory Visit: Payer: Self-pay | Admitting: Cardiovascular Disease

## 2013-10-20 NOTE — Telephone Encounter (Signed)
Rx was sent to pharmacy electronically. 

## 2013-11-24 ENCOUNTER — Encounter: Payer: Self-pay | Admitting: Neurology

## 2013-11-24 ENCOUNTER — Ambulatory Visit (INDEPENDENT_AMBULATORY_CARE_PROVIDER_SITE_OTHER): Payer: Medicare Other | Admitting: Neurology

## 2013-11-24 VITALS — BP 121/74 | HR 90 | Ht 73.0 in | Wt 200.0 lb

## 2013-11-24 DIAGNOSIS — G894 Chronic pain syndrome: Secondary | ICD-10-CM

## 2013-11-24 DIAGNOSIS — E1144 Type 2 diabetes mellitus with diabetic amyotrophy: Secondary | ICD-10-CM

## 2013-11-24 DIAGNOSIS — M5416 Radiculopathy, lumbar region: Secondary | ICD-10-CM

## 2013-11-24 MED ORDER — VENLAFAXINE HCL ER 37.5 MG PO CP24: 37.5000 mg | ORAL_CAPSULE | Freq: Every day | ORAL | Status: DC

## 2013-11-24 NOTE — Progress Notes (Addendum)
[facility]    Provider:  Dr Jaynee Eagles Referring Provider: [name], * Primary Care Physician:  [name], MD  CC:  Right leg pain  HPI:  Marc [name] is a 73 y.o. male here as a referral from Dr. Arelia Sneddon for right groin and anterior thigh pain.  He reports a history of 3 years of LBP with shooting pain down the left leg.  November 4th he had back fusion, went home and was going great. [place] and in January of this year had knee replacement. He has been on pain medication for over 3 years for his back; he is on oxycodone and neurontin. Was back in the hospital 2 months ago and couldn't walk due to right leg pain in the groin. [facility] groin and was told he had an abscess. Repeat MRI of the right hip instead showed a tendon tear and a hematoma. Since that time, he is having electric pain across the back and he has sensitivity and pain in the anterior right thigh. He is on Indocin which is not helping with this pain, neither is the Oxycodone or neurontin. Not improving. Is severe. Is diabetic but says he does not monitor his glucose. The right anterior thigh hurts with pressure and when he starts getting up from a seated position. The pain is so bad that it "Causes me to seize up" but he can't explain it exactly, it is difficult to explain the pain. Pain is constant and severe. Worse when dog jumps up on him.  He also reports numbness in the feet. He has been told he has diabetic neuropathy but he doesn't think he has had diabetes long enough to have neuropathy.   Reviewed notes, labs and imaging from outside physicians, which showed: MRI of thehip showed right hip partial iliopsoas tendon tear. MRI of the lumbar spine 08/2013 shows new left synovial cyst at L3 resulting in moderate central stenosis, previous decompression and fusion at L4-L5 (this is in pcps notes, no full report or imaging provided). CMP unremarkable.  CMP with CKD. Hepc/hiv neg.   MR right hip and femur 08/2013: IMPRESSION:  1. Small but irregular right iliopsoas fluid collection with  enhancing margins and surrounding myositis. Appearance compatible  with right iliopsoas distal abscess. Mild myositis involving the  right vastus intermedius, rectus femoris vastus lateralis, and right  adductor minimus muscles. Potential low-grade fasciitis along these  muscle margins.  2. Abnormal patchy marrow edema and enhancement distally in the  femoral diaphysis and metaphysis, suspicious for early  osteomyelitis.   Review of Systems: Patient complains of symptoms per HPI as well as the following symptoms constipation, joint pain, easy bruising. Pertinent negatives per HPI. All others negative.   History   Social History  . Marital Status: Married    Spouse Name: N/A    Number of Children: 2  . Years of Education: N/A   Occupational History  . retired   . cattle farm     owner   Social History Main Topics  . Smoking status: Current Every Day Smoker -- 2.00 packs/day for 58 years    Types: Cigarettes  . Smokeless tobacco: Former Systems developer    Types: Chew     Comment: occ alcohol  . 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 Drug Use: No  . Sexual Activity: No   Other Topics Concern  . Not on file   Social History Narrative   Patient lives at home with his spouse.   Caffeine Use: 2 cups daily    Family History  Problem Relation Age of Onset  . Colon cancer Paternal Uncle     Uncle  . Heart disease Father   . Heart disease Mother     Past Medical History  Diagnosis Date  . Hypertension   . Ulcer disease   . Colon polyp   . Gout   . Bradycardia   . Hypercholesteremia   . PAD (peripheral artery disease)   . Chronic bronchitis     "used to be very susceptible when I was a kid & smoking" (02/04/2012)  .  Type II diabetes mellitus   . History of blood transfusion ~ 2007    "10 of them when I had stomach bleeding"  . Bleeding stomach ulcer 2007  . Arthritis     "qwhere that you can get it; mostly in my shoulders" (02/04/2012)  . Gout   . S/P angioplasty with stent, to distal RT ext. iliac artery 03/10/2012  . Tobacco abuse disorder 03/10/2012  . GERD (gastroesophageal reflux disease)   . Muscle spasms of neck     has spasms over body when get up from sitting  . Pneumonia     Past Surgical History  Procedure Laterality Date  . Splenectomy  ~ 1957  . Anterior cervical decomp/discectomy fusion  ~ 2007  . Upper endoscopy w/ sclerotherapy  ~ 2007  . Polypectomy    . Colonoscopy    . Iliac artery stent      left common/notes (02/04/2012)  . Hernia repair  ~ 2007    UHR (02/04/2012)  . Tonsillectomy  ~ 1947  . Sfa    . Sfa  03/10/2012    ILLEAC STENOSIS  . Back surgery  11/14  . Total knee arthroplasty Right 02/03/2013    Procedure: TOTAL KNEE ARTHROPLASTY;  Surgeon: [name], MD;  Location: [facility];  Service: Orthopedics;  Laterality: Right;  . Doppler echocardiography  07/26/2005  . Lexiscan stress test  01/24/12    Current Outpatient Prescriptions  Medication Sig Dispense Refill  . allopurinol (ZYLOPRIM) 300 MG tablet Take 300 mg by mouth daily.      [name] aspirin EC 325 MG tablet Take 1 tablet (325 mg total) by mouth 2 (two) times daily.  30 tablet  0  . gabapentin (NEURONTIN) 600 MG tablet Take 1,200 mg by mouth every 8 (eight) hours.       [name] glimepiride (AMARYL) 4 MG tablet Take 4 mg by mouth 2 (two) times daily.       [name] HYDROcodone-acetaminophen (NORCO) 10-325 MG per tablet Take 1 tablet by mouth daily.      . INDOMETHACIN ER PO Take by mouth.      [name] lisinopril (PRINIVIL,ZESTRIL) 20 MG tablet Take 20 mg by mouth daily.        [name] lovastatin (MEVACOR) 40 MG tablet Take 40 mg by mouth at bedtime.      [name] omeprazole (PRILOSEC OTC) 20 MG tablet Take 40 mg by mouth daily.       .  pioglitazone (ACTOS) 30 MG tablet Take 30 mg by mouth daily.      . predniSONE (DELTASONE) 10 MG tablet Take 10 mg by mouth daily  with breakfast.      . venlafaxine XR (EFFEXOR-XR) 37.5 MG 24 hr capsule Take 1 capsule (37.5 mg total) by mouth daily with breakfast.  30 capsule  3   No current facility-administered medications for this visit.    Allergies as of 11/24/2013  . (No Known Allergies)    Vitals: BP 121/74  Pulse 90  Ht 6\' 1"  (1.854 m)  Wt 200 lb (90.719 kg)  BMI 26.39 kg/m2 Last Weight:  Wt Readings from Last 1 Encounters:  11/24/13 200 lb (90.719 kg)   Last Height:   Ht Readings from Last 1 Encounters:  11/24/13 6\' 1"  (1.854 m)    Physical exam: Exam: Gen: NAD, conversant, well nourised, obese, well groomed                     CV: RRR, no MRG. No Carotid Bruits. No peripheral edema, warm, nontender Eyes: Conjunctivae clear without exudates or hemorrhage  Neuro: Detailed Neurologic Exam  Speech:    Speech is normal; fluent and spontaneous with normal comprehension.  Cognition:    The patient is oriented to person, place, and time;     recent and remote memory intact;     language fluent;     normal attention, concentration,    fund of knowledge  Cranial Nerves:    The pupils are equal, round, and reactive to light. The fundi are normal and spontaneous venous pulsations are present. Visual fields are full to finger confrontation. Extraocular movements are intact. Trigeminal sensation is intact and the muscles of mastication are normal. The face is symmetric. The palate elevates in the midline. Voice is normal. Shoulder shrug is normal. The tongue has normal motion without fasciculations.   Coordination:    No dysmetria  Gait:    Antalgic, uses a cane  Motor Observation:    No asymmetry, no atrophy, and no involuntary movements noted.  Tone:    Normal muscle tone.    Posture:    Posture is normal. 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   DTR's:    Deep tendon reflexes in the upper extremities are normal bilaterally. Absent patellars(cannot test the [place]) Toes: Cannot test, patient won't remove shoes Clonus:    Clonus is absent.      Assessment/Plan:  73 year old male PMHx PAD, HTN, DM, Tobacco abuse and current smoker (declines cessation), HLD  with chronic low back pain and 2 months of right anterior thigh pain. Unfortunately I don't have a report or imaging of MRI of the lumbar spine from 08/2013, will request records. Differential could include L3/L4 radiculopathy, diabetic amyotrophy or local neuropathy from right groin hematoma compression 2 months ago. An EMG/NCS will likely show has significant distal diabetic axonal neuropathy and I can't perform needle exam above the knee or in deep/paraspinal muscles because he reports that he is on Plavix and ASA.  Will request his MRI records and talk to him after I review. Will try Effexor for pain. Follow up after I received and review records.  Sarina Ill, MD  [facility] [address] [place], [place] [address]  Phone [phone] Fax [phone]  Lenor Coffin

## 2013-11-24 NOTE — Patient Instructions (Signed)
Overall you are doing fairly well but I do want to suggest a few things today:   Remember to drink plenty of fluid, eat healthy meals and do not skip any meals. Try to eat protein with a every meal and eat a healthy snack such as fruit or nuts in between meals. Try to keep a regular sleep-wake schedule and try to exercise daily, particularly in the form of walking, 20-30 minutes a day, if you can.   As far as your medications are concerned, I would like to suggest:Venlafaxine 37.5 mg once daily for pain. Call in a week and can increase if needed.  As far as diagnostic testing: I will review recent images of low back  I would like to see you back in 3 months, sooner if we need to. Please call us with any interim questions, concerns, problems, updates or refill requests.   Please also call us for any test results so we can go over those with you on the phone.  My clinical assistant and will answer any of your questions and relay your messages to me and also relay most of my messages to you.   Our phone number is [phone]. We also have an after hours call service for urgent matters and there is a physician on-call for urgent questions. For any emergencies you know to call 911 or go to the nearest emergency room

## 2013-11-25 ENCOUNTER — Encounter: Payer: Self-pay | Admitting: Neurology

## 2013-12-02 ENCOUNTER — Telehealth: Payer: Self-pay | Admitting: *Deleted

## 2013-12-02 NOTE — Telephone Encounter (Signed)
Dr Jaynee Eagles requested records from [facility] 12-02-13.

## 2013-12-07 NOTE — Telephone Encounter (Signed)
Dr Jaynee Eagles requested records on patient from [facility] orth[facility], they said he never came their for an appointment.

## 2013-12-29 ENCOUNTER — Telehealth: Payer: Self-pay | Admitting: Neurology

## 2013-12-29 NOTE — Telephone Encounter (Signed)
Marc [name] - Would you call this patient and let him know that I have not been successful in getting the report for his last lumbar MRI. I would like to have the MRI report from the last lumbar MRI he had completed (the one he had done after his last lumbar spine surgery). He will have to go to the facility where he had the imaging completed and request a copy and have it sent to me or bring it to his next appointment. Thank you.

## 2014-01-03 NOTE — Telephone Encounter (Signed)
Tried calling patient to relay Dr. Jaynee Eagles message but no answer and voicemail is full.

## 2014-01-05 ENCOUNTER — Other Ambulatory Visit: Payer: Self-pay | Admitting: Orthopedic Surgery

## 2014-01-06 ENCOUNTER — Encounter (HOSPITAL_COMMUNITY): Payer: Self-pay | Admitting: Cardiovascular Disease

## 2014-01-11 ENCOUNTER — Other Ambulatory Visit (HOSPITAL_COMMUNITY): Payer: Self-pay | Admitting: *Deleted

## 2014-01-11 NOTE — Pre-Procedure Instructions (Addendum)
Marc [name]  01/11/2014   Your procedure is scheduled on:  Wednesday, January 19, 2014    Report to [facility] Entrance "A" Admitting Office at 10:00 AM.   Call this number if you have problems the morning of surgery: [phone]               Any questions prior to day of surgery, please call [phone] between 8 & 4 PM.   Remember:   Do not eat food or drink liquids after midnight Tuesday, 01/18/14.   Take these medicines the morning of surgery with A SIP OF WATER: allopurinol (ZYLOPRIM), gabapentin (NEURONTIN), omeprazole (PRILOSEC OTC),  oxyCODONE (ROXICODONE) - if needed.   Stop Aspirin and Indocin 5 days prior to surgery. Stop Plavix 5 days prior to surgery per order of Dr. Arelia Sneddon.(01/14/14)  Do not take your diabetic medications the day of surgery.   Do not wear jewelry.  Do not wear lotions, powders, or cologne. You may wear deodorant.  Men may shave face and neck.  Do not bring valuables to the hospital.  [facility] is not responsible                  for any belongings or valuables.               Contacts, dentures or bridgework may not be worn into surgery.  Leave suitcase in the car. After surgery it may be brought to your room.  For patients admitted to the hospital, discharge time is determined by your                treatment team.              Special Instructions: Ward - Preparing for Surgery  Before surgery, you can play an important role.  Because skin is not sterile, your skin needs to be as free of germs as possible.  You can reduce the number of germs on you skin by washing with CHG (chlorahexidine gluconate) soap before surgery.  CHG is an antiseptic cleaner which kills germs and bonds with the skin to continue killing germs even after washing.  Please DO NOT use if you have an allergy to CHG or antibacterial soaps.  If your skin becomes reddened/irritated stop using the CHG and inform your nurse when you arrive at Short Stay.  Do  not shave (including legs and underarms) for at least 48 hours prior to the first CHG shower.  You may shave your face.  Please follow these instructions carefully:   1.  Shower with CHG Soap the night before surgery and the                                morning of Surgery.  2.  If you choose to wash your hair, wash your hair first as usual with your       normal shampoo.  3.  After you shampoo, rinse your hair and body thoroughly to remove the                      Shampoo.  4.  Use CHG as you would any other liquid soap.  You can apply chg directly       to the skin and wash gently with scrungie or a clean washcloth.  5.  Apply the CHG Soap to your body ONLY FROM THE NECK DOWN.  Do not use on open wounds or open sores.  Avoid contact with your eyes, ears, mouth and genitals (private parts).  Wash genitals (private parts) with your normal soap.  6.  Wash thoroughly, paying special attention to the area where your surgery        will be performed.  7.  Thoroughly rinse your body with warm water from the neck down.  8.  DO NOT shower/wash with your normal soap after using and rinsing off       the CHG Soap.  9.  Pat yourself dry with a clean towel.            10.  Wear clean pajamas.            11.  Place clean sheets on your bed the night of your first shower and do not        sleep with pets.  Day of Surgery  Do not apply any lotions the morning of surgery.  Please wear clean clothes to the hospital.     Please read over the following fact sheets that you were given: Pain Booklet, Coughing and Deep Breathing, Blood Transfusion Information, MRSA Information and Surgical Site Infection Prevention

## 2014-01-12 ENCOUNTER — Encounter (HOSPITAL_COMMUNITY): Payer: Self-pay

## 2014-01-12 ENCOUNTER — Ambulatory Visit (HOSPITAL_COMMUNITY)
Admission: RE | Admit: 2014-01-12 | Discharge: 2014-01-12 | Disposition: A | Discharge status: Home / Self Care | Payer: Medicare Other | Source: Ambulatory Visit | Attending: Orthopedic Surgery | Admitting: Orthopedic Surgery

## 2014-01-12 ENCOUNTER — Encounter (HOSPITAL_COMMUNITY)
Admission: RE | Admit: 2014-01-12 | Discharge: 2014-01-12 | Disposition: A | Discharge status: Home / Self Care | Payer: Medicare Other | Source: Ambulatory Visit | Attending: Orthopedic Surgery | Admitting: Orthopedic Surgery

## 2014-01-12 DIAGNOSIS — I440 Atrioventricular block, first degree: Secondary | ICD-10-CM | POA: Insufficient documentation

## 2014-01-12 DIAGNOSIS — M5134 Other intervertebral disc degeneration, thoracic region: Secondary | ICD-10-CM | POA: Diagnosis not present

## 2014-01-12 DIAGNOSIS — I252 Old myocardial infarction: Secondary | ICD-10-CM | POA: Diagnosis not present

## 2014-01-12 DIAGNOSIS — I491 Atrial premature depolarization: Secondary | ICD-10-CM | POA: Insufficient documentation

## 2014-01-12 DIAGNOSIS — I10 Essential (primary) hypertension: Secondary | ICD-10-CM | POA: Diagnosis not present

## 2014-01-12 DIAGNOSIS — J449 Chronic obstructive pulmonary disease, unspecified: Secondary | ICD-10-CM | POA: Diagnosis not present

## 2014-01-12 DIAGNOSIS — Z01818 Encounter for other preprocedural examination: Secondary | ICD-10-CM

## 2014-01-12 DIAGNOSIS — I739 Peripheral vascular disease, unspecified: Secondary | ICD-10-CM | POA: Insufficient documentation

## 2014-01-12 DIAGNOSIS — F17210 Nicotine dependence, cigarettes, uncomplicated: Secondary | ICD-10-CM | POA: Diagnosis not present

## 2014-01-12 DIAGNOSIS — Z95820 Peripheral vascular angioplasty status with implants and grafts: Secondary | ICD-10-CM | POA: Diagnosis not present

## 2014-01-12 DIAGNOSIS — E119 Type 2 diabetes mellitus without complications: Secondary | ICD-10-CM | POA: Insufficient documentation

## 2014-01-12 DIAGNOSIS — E780 Pure hypercholesterolemia: Secondary | ICD-10-CM | POA: Diagnosis not present

## 2014-01-12 DIAGNOSIS — I771 Stricture of artery: Secondary | ICD-10-CM | POA: Insufficient documentation

## 2014-01-12 LAB — URINALYSIS, ROUTINE W REFLEX MICROSCOPIC
BILIRUBIN URINE: NEGATIVE
Glucose, UA: NEGATIVE mg/dL
Hgb urine dipstick: NEGATIVE
KETONES UR: NEGATIVE mg/dL
LEUKOCYTES UA: NEGATIVE
NITRITE: NEGATIVE
Protein, ur: NEGATIVE mg/dL
Specific Gravity, Urine: 1.018 (ref 1.005–1.030)
UROBILINOGEN UA: 1 mg/dL (ref 0.0–1.0)
pH: 5.5 (ref 5.0–8.0)

## 2014-01-12 LAB — COMPREHENSIVE METABOLIC PANEL
ALBUMIN: 4 g/dL (ref 3.5–5.2)
AST: 16 U/L (ref 0–37)
Alkaline Phosphatase: 116 U/L (ref 39–117)
Anion gap: 16 — ABNORMAL HIGH (ref 5–15)
BUN: 34 mg/dL — ABNORMAL HIGH (ref 6–23)
CALCIUM: 9.8 mg/dL (ref 8.4–10.5)
CHLORIDE: 103 meq/L (ref 96–112)
CO2: 23 meq/L (ref 19–32)
CREATININE: 1.43 mg/dL — AB (ref 0.50–1.35)
GFR calc Af Amer: 54 mL/min — ABNORMAL LOW (ref >90)
GFR calc non Af Amer: 47 mL/min — ABNORMAL LOW (ref >90)
Glucose, Bld: 210 mg/dL — ABNORMAL HIGH (ref 70–99)
Potassium: 4.7 mEq/L (ref 3.7–5.3)
SODIUM: 142 meq/L (ref 137–147)
Total Bilirubin: 0.4 mg/dL (ref 0.3–1.2)
Total Protein: 7.5 g/dL (ref 6.0–8.3)

## 2014-01-12 LAB — PROTIME-INR
INR: 1.05 (ref 0.00–1.49)
Prothrombin Time: 13.8 seconds (ref 11.6–15.2)

## 2014-01-12 LAB — CBC WITH DIFFERENTIAL/PLATELET
BASOS PCT: 1 % (ref 0–1)
Basophils Absolute: 0 10*3/uL (ref 0.0–0.1)
Eosinophils Absolute: 0.2 10*3/uL (ref 0.0–0.7)
Eosinophils Relative: 3 % (ref 0–5)
HEMATOCRIT: 41.6 % (ref 39.0–52.0)
Hemoglobin: 14.1 g/dL (ref 13.0–17.0)
Lymphocytes Relative: 29 % (ref 12–46)
Lymphs Abs: 2.1 10*3/uL (ref 0.7–4.0)
MCH: 32.4 pg (ref 26.0–34.0)
MCHC: 33.9 g/dL (ref 30.0–36.0)
MCV: 95.6 fL (ref 78.0–100.0)
MONO ABS: 0.4 10*3/uL (ref 0.1–1.0)
Monocytes Relative: 5 % (ref 3–12)
NEUTROS ABS: 4.5 10*3/uL (ref 1.7–7.7)
Neutrophils Relative %: 62 % (ref 43–77)
Platelets: 225 10*3/uL (ref 150–400)
RBC: 4.35 MIL/uL (ref 4.22–5.81)
RDW: 13.7 % (ref 11.5–15.5)
WBC: 7.1 10*3/uL (ref 4.0–10.5)

## 2014-01-12 LAB — TYPE AND SCREEN
ABO/RH(D): O NEG
Antibody Screen: NEGATIVE

## 2014-01-12 LAB — SURGICAL PCR SCREEN
MRSA, PCR: NEGATIVE
Staphylococcus aureus: NEGATIVE

## 2014-01-12 LAB — APTT: aPTT: 34 seconds (ref 24–37)

## 2014-01-12 NOTE — Progress Notes (Addendum)
Anesthesia Chart Review:  Pt is 74 year old male scheduled for L lumbar lateral interbody fusion with instrumentation, allograft on 01/19/2014 with Dr. Lynann Bologna.   PMH: HTN, DM, PAD (S/p angioplasty with stent to distal R ext iliac artery 02/2012), hypercholesterolemia.  Current smoker.   Preoperative labs reviewed.  Glucose 210. BUN 34, Cr 1.43.   Chest x-ray reviewed. COPD. No active cardiopulmonary disease.   EKG: sinus rhythm with 1st degree AV block with PACs. LAD. Anterior infarct, age undetermined. This is significantly different from EKG dated 11/27/2012.   Nuclear stress test 01/24/2012: Overall Impression: Normal stress nuclear study.  Discussed with Dr. Linna Caprice. Pt will need cardiac clearance prior to surgery. Notified Carla in Dr. Laurena Bering office.   Willeen Cass, FNP-BC [facility] Short Stay Surgical Center/Anesthesiology Phone: [phone] 01/12/2014 3:17 PM  Addendum:  I received a phone call from Sandi Raveling at Dr. Laurena Bering office.  Their office received a phone call from Dr. Thurman Coyer office stating that patient was clear for surgery following an appointment this afternoon. Dr. Thurman Coyer office is suppose to fax a copy of the clearance note to their office once signed.  George Hugh [facility] Short Stay Center/Anesthesiology Phone [phone] 01/18/2014 5:04 PM

## 2014-01-18 ENCOUNTER — Encounter: Payer: Self-pay | Admitting: Cardiology

## 2014-01-18 DIAGNOSIS — N183 Chronic kidney disease, stage 3 unspecified: Secondary | ICD-10-CM | POA: Insufficient documentation

## 2014-01-18 MED ORDER — POVIDONE-IODINE 7.5 % EX SOLN
Freq: Once | CUTANEOUS | Status: DC
  Filled 2014-01-18: qty 118

## 2014-01-18 MED ORDER — CEFAZOLIN SODIUM-DEXTROSE 2-3 GM-% IV SOLR
2.0000 g | INTRAVENOUS | Status: AC
  Administered 2014-01-19: 2 g via INTRAVENOUS
  Filled 2014-01-18: qty 50

## 2014-01-18 NOTE — Consult Note (Signed)
Marc [name]    Date of visit:  01/18/2014 DOB:  10/03/1940    Age:  73 yrs. Medical record number:  [id number]     Account number:  [id number] Primary Care Provider: Claris Gower ____________________________ CURRENT DIAGNOSES  1. Encounter for preprocedural cardiovascular examination  2. Hypertensive heart disease without heart failure  3. Type 2 diabetes mellitus with diabetic polyneuropathy  4. Hyperlipidemia  5. Atherosclerosis Of Native Arteries Of Extremities With Intermittent Claudication, Bilateral Legs  6. Chronic kidney disease, stage 3 (moderate)  7. Nicotine dependence, cigarettes, uncomplicated ____________________________ ALLERGIES  No Known Allergies ____________________________ MEDICATIONS  1. lisinopril 20 mg tablet, 1 p.o. daily  2. glimepiride 4 mg tablet, 2 p.o. daily  3. allopurinol 300 mg tablet, 1 p.o. daily  4. lovastatin 40 mg tablet, 1 p.o. daily  5. gabapentin 600 mg tablet, 2 tid  6. omeprazole 20 mg capsule,delayed release, 1 p.o. daily  7. oxycodone 15 mg tablet, QID  8. tramadol 50 mg tablet, QID ____________________________ CHIEF COMPLAINTS  Clearance for back surgery ____________________________ HISTORY OF PRESENT ILLNESS  This 73 year old male is seen at the request of Dr. Arelia Sneddon for preoperative cardiac evaluation. The patient has a long history of severe spinal stenosis as well as cervical disc disease. He has a long-standing history of heavy tobacco abuse and also has peripheral vascular disease with claudication. He had a previous left iliac stents done in January of 2014 him had a superficial femoral artery occlusion that was unable to be crossed. He has claudication involving both legs. He has developed severe lumbar disc disease and had back surgery a year ago that he tolerated well. He later fell and hurt his knee and had a knee replacement and then has reinjured his back and is due to have repeat back surgery. He evidently had what was  felt to be an iliopsoas abscess although it is unclear whether this was an abscess or hematoma.  He is seen today for cardiac evaluation. He has no angina but continues to smoke heavily. He does have claudication. He has no shortness of breath. He does have known hyperlipidemia. He had a myocardial perfusion scan in December of 2013 that showed an ejection fraction of 58% and no ischemia. ____________________________ PAST HISTORY  Past Medical Illnesses:  hypertension, DM-non-insulin dependent, hyperlipidemia, GI bleed from ulcers 2009;  Cardiovascular Illnesses:  peripheral vascular disease;  Surgical Procedures:  laminectomy cervical, splenectomy, laminectomy lumbar, knee replacement-rt;  NYHA Classification:  II;  Canadian Angina Classification:  Class 0: Asymptomatic;  Cardiology Procedures-Invasive:  no history of prior cardiac procedures;  Cardiology Procedures-Noninvasive:  treadmill, treadmill cardiolite;  Peripheral Vascular Procedures:  stent left iliac 01/2012;  LVEF of 58% documented via nuclear study on 01/24/2012,   ____________________________ CARDIO-PULMONARY TEST DATES EKG Date:  01/18/2014;  Nuclear Study Date:  01/24/2012;   ____________________________ FAMILY HISTORY Brother -- Brother alive and well Brother -- Brother dead Father -- Father dead, Heart Attack Mother -- Mother dead, Congestive heart failure ____________________________ SOCIAL HISTORY Alcohol Use:  no alcohol use;  Smoking:  smokes 1-2 ppd, greater than 50 pack year history;  Diet:  regular diet;  Lifestyle:  divorced, remarried and separated;  Exercise:  no regular exercise;  Occupation:  retired and Estate manager/land agent;  Residence:  lives alone;   ____________________________ [place]:  weight loss of approximately 50 lbs  Integumentary: no rashes or new skin lesions. Eyes: denies diplopia, history of glaucoma or visual problems. Ears, Nose, Throat, Mouth:  denies any hearing loss, epistaxis,  hoarseness or difficulty speaking. Respiratory: denies dyspnea, cough, wheezing or hemoptysis. Cardiovascular:  please review HPI Abdominal: prior history of GI bleed  Genitourinary-Male: no dysuria, urgency, frequency, or nocturia  Musculoskeletal:  chronic low back pain, sciatica   Neurological:  numbness of feet Psychiatric:  denies depession or anxiety  ____________________________ PHYSICAL EXAMINATION VITAL SIGNS  Blood Pressure:  140/78 Sitting, Right arm, regular cuff  , 146/80 Standing, Right arm and regular cuff   Pulse:  80/min. Weight:  202.00 lbs. Height:  73.5"BMI: 26  Constitutional:  pleasant white male in no acute distress walks with cane Skin:  tattoos, ecchymosis present arm(s) Head:  normocephalic, normal hair pattern, no masses or tenderness Eyes:  EOMS Intact, PERRLA, C and S clear, Funduscopic exam not done. ENT:  ears, nose and throat reveal no gross abnormalities.  Dentition good. Neck:  supple, without massess. No JVD, thyromegaly or carotid bruits. Carotid upstroke normal. Chest:  normal symmetry, clear to auscultation. Cardiac:  regular rhythm, normal S1 and S2, No S3 or S4, no murmurs, gallops or rubs detected. Abdomen:  abdomen soft,non-tender, no masses, no hepatospenomegaly, or aneurysm noted Peripheral Pulses:  the femoral,dorsalis pedis, and posterior tibial pulses are full and equal bilaterally with no bruits auscultated. Extremities & Back:  limitation of motion of back Neurological:  gait slow ____________________________ MOST RECENT LIPID PANEL 01/18/14  CHOL TOTL 168 mg/dl, LDL 59 NM, HDL 44 mg/dl, TRIGLYCER 325 mg/dl and CHOL/HDL 3.8 (Calc) ____________________________ IMPRESSIONS/PLAN 1. From a cardiovascular viewpoint may proceed with the planned lumbar disc surgery. He has had 2 operations in the past year that he tolerated well and his surgical risk should be average at this time. His risk is increased due to his age, tobacco abuse and his chronic  kidney disease. He should be on a telemetry bed post op.  2. Atherosclerotic vascular disease with previous claudication 3. Hypertensive heart disease 4. Stage III chronic kidney disease 5. Diabetes mellitus with peripheral neuropathy  Recommendations:  He is currently off of his antiplatelets but should resume these after the surgery. He should continue his other medicines during the time of surgery. We discussed the importance of stopping smoking. Thank you for asking me to see him with you.  EKG shows sinus rhythm and left axis deviation. ____________________________ TODAYS ORDERS  1. Lipid Panel: Today  2. 12 Lead EKG: Today                       ____________________________ Cardiology Physician:  Kerry Hough MD [facility]

## 2014-01-18 NOTE — Progress Notes (Signed)
Spoke with Marc [name] at Dr Kym Groom office (she states Marc [name] is off for the week) and she said that pt is going for his cardiac workup today at 1pm.  Dr Leda Gauze is anticipating approval and leaving case as scheduled but she will fax the workup results ([phone]) to [place] today when completed.

## 2014-01-18 NOTE — H&P (Signed)
PREOPERATIVE H&P  Chief Complaint: bilateral leg pain  HPI: Marc [name] is a 73 y.o. male who presents with ongoing pain in the bilateral legs with walking  MRI reveals stenosis at L3/4. Patient is s/p an L4/5 decompression and fusion, and did well from that surgery, but did develop ASD at level above 1 year later.   Patient has failed multiple forms of conservative care and continues to have pain (see office notes for additional details regarding the patient's full course of treatment)  Past Medical History  Diagnosis Date  . Hypertension   . Ulcer disease   . Colon polyp   . Gout   . Bradycardia   . Hypercholesteremia   . PAD (peripheral artery disease)   . Chronic bronchitis   . Type II diabetes mellitus   . History of GI bleed 2007  . Gout   . Tobacco abuse disorder 03/10/2012  . GERD (gastroesophageal reflux disease)   . Pneumonia     denies   Past Surgical History  Procedure Laterality Date  . Splenectomy  ~ 1957  . Anterior cervical decomp/discectomy fusion  ~ 2007  . Upper endoscopy w/ sclerotherapy  ~ 2007  . Polypectomy    . Colonoscopy    . Iliac artery stent      left common/notes (02/04/2012)  . Hernia repair  ~ 2007    UHR (02/04/2012)  . Tonsillectomy  ~ 1947  . Lumbar laminectomy  11/14  . Total knee arthroplasty Right 02/03/2013    Procedure: TOTAL KNEE ARTHROPLASTY;  Surgeon: [name], MD;  Location: [facility];  Service: Orthopedics;  Laterality: Right;  . Lexiscan stress test  01/24/12  . Lower extremity angiogram N/A 02/04/2012    Procedure: LOWER EXTREMITY ANGIOGRAM;  Surgeon: Lorretta Harp, MD;  Location: [facility] CATH LAB;  Service: Cardiovascular;  Laterality: N/A;  . Percutaneous stent intervention Left 02/04/2012    Procedure: PERCUTANEOUS STENT INTERVENTION;  Surgeon: Lorretta Harp, MD;  Location: [facility] CATH LAB;  Service: Cardiovascular;  Laterality: Left;  lt ext iliac stent  . Replacement total knee Right    History   Social History    . Marital Status: Married    Spouse Name: N/A    Number of Children: 2  . Years of Education: N/A   Occupational History  . retired   . cattle farm     owner   Social History Main Topics  . Smoking status: Current Every Day Smoker -- 2.00 packs/day for 58 years    Types: Cigarettes  . Smokeless tobacco: Former Systems developer    Types: Chew     Comment: occ alcohol  . Alcohol Use: Yes     Comment:  quit for 22 yr; egg nog christmas  . Drug Use: No  . Sexual Activity: No   Other Topics Concern  . Not on file   Social History Narrative   Patient lives at home with his spouse.   Caffeine Use: 2 cups daily   Family History  Problem Relation Age of Onset  . Colon cancer Paternal Uncle     Uncle  . Heart disease Father   . Heart disease Mother    No Known Allergies Prior to Admission medications   Medication Sig Start Date End Date Taking? Authorizing Provider  allopurinol (ZYLOPRIM) 300 MG tablet Take 300 mg by mouth daily.   Yes Historical Provider, MD  aspirin EC 325 MG tablet Take 1 tablet (325 mg total)  by mouth 2 (two) times daily. 02/08/13  Yes Leighton Parody, PA-C  clopidogrel (PLAVIX) 75 MG tablet Take 1 tablet by mouth daily. 11/22/13  Yes Historical Provider, MD  furosemide (LASIX) 80 MG tablet Take 1 tablet by mouth daily. 10/20/13  Yes Historical Provider, MD  gabapentin (NEURONTIN) 600 MG tablet Take 1,200 mg by mouth every 8 (eight) hours.    Yes Historical Provider, MD  glimepiride (AMARYL) 4 MG tablet Take 4 mg by mouth 2 (two) times daily.    Yes Historical Provider, MD  indomethacin (INDOCIN) 25 MG capsule Take 25 mg by mouth 2 (two) times daily with a meal.   Yes Historical Provider, MD  lisinopril (PRINIVIL,ZESTRIL) 20 MG tablet Take 20 mg by mouth daily.     Yes Historical Provider, MD  lovastatin (MEVACOR) 40 MG tablet Take 40 mg by mouth at bedtime.   Yes Historical Provider, MD  omeprazole (PRILOSEC OTC) 20 MG tablet Take 40 mg by mouth daily.    Yes Historical  Provider, MD  oxyCODONE (ROXICODONE) 15 MG immediate release tablet Take 15 mg by mouth every 6 (six) hours as needed for pain.   Yes Historical Provider, MD  pioglitazone (ACTOS) 30 MG tablet Take 30 mg by mouth daily.   Yes Historical Provider, MD  predniSONE (DELTASONE) 10 MG tablet Take 10 mg by mouth daily with breakfast.   Yes Historical Provider, MD  venlafaxine XR (EFFEXOR-XR) 37.5 MG 24 hr capsule Take 1 capsule (37.5 mg total) by mouth daily with breakfast. 11/24/13  Yes Melvenia Beam, MD     All other systems have been reviewed and were otherwise negative with the exception of those mentioned in the HPI and as above.  Physical Exam: There were no vitals filed for this visit.  General: Alert, no acute distress Cardiovascular: No pedal edema Respiratory: No cyanosis, no use of accessory musculature Skin: No lesions in the area of chief complaint Neurologic: Sensation intact distally Psychiatric: Patient is competent for consent with normal mood and affect Lymphatic: No axillary or cervical lymphadenopathy  MUSCULOSKELETAL: + TTP low back  Assessment/Plan: Spinal stenosis Plan for Procedure(s): LATERAL INTERBODY FUSION 1 LEVEL   Sinclair Ship, MD 01/18/2014 8:13 PM

## 2014-01-18 NOTE — Progress Notes (Signed)
Patient notified to arrive at [place] tomorrow morning for surgery at 0830

## 2014-01-19 ENCOUNTER — Inpatient Hospital Stay (HOSPITAL_COMMUNITY)
Admission: RE | Admit: 2014-01-19 | Discharge: 2014-01-21 | DRG: 460 | Disposition: A | Discharge status: Home / Self Care | Payer: Medicare Other | Source: Ambulatory Visit | Attending: Orthopedic Surgery | Admitting: Orthopedic Surgery

## 2014-01-19 ENCOUNTER — Inpatient Hospital Stay (HOSPITAL_COMMUNITY): Payer: Medicare Other

## 2014-01-19 ENCOUNTER — Encounter (HOSPITAL_COMMUNITY): Payer: Self-pay | Admitting: *Deleted

## 2014-01-19 ENCOUNTER — Encounter (HOSPITAL_COMMUNITY)
Admission: RE | Disposition: A | Discharge status: Home / Self Care | Payer: Self-pay | Source: Ambulatory Visit | Attending: Orthopedic Surgery

## 2014-01-19 ENCOUNTER — Inpatient Hospital Stay (HOSPITAL_COMMUNITY): Payer: Medicare Other | Admitting: Anesthesiology

## 2014-01-19 ENCOUNTER — Inpatient Hospital Stay (HOSPITAL_COMMUNITY): Payer: Medicare Other | Admitting: Emergency Medicine

## 2014-01-19 DIAGNOSIS — Z96651 Presence of right artificial knee joint: Secondary | ICD-10-CM | POA: Diagnosis present

## 2014-01-19 DIAGNOSIS — K219 Gastro-esophageal reflux disease without esophagitis: Secondary | ICD-10-CM | POA: Diagnosis present

## 2014-01-19 DIAGNOSIS — F17210 Nicotine dependence, cigarettes, uncomplicated: Secondary | ICD-10-CM | POA: Diagnosis present

## 2014-01-19 DIAGNOSIS — N183 Chronic kidney disease, stage 3 (moderate): Secondary | ICD-10-CM | POA: Diagnosis present

## 2014-01-19 DIAGNOSIS — I1310 Hypertensive heart and chronic kidney disease without heart failure, with stage 1 through stage 4 chronic kidney disease, or unspecified chronic kidney disease: Secondary | ICD-10-CM | POA: Diagnosis present

## 2014-01-19 DIAGNOSIS — E1142 Type 2 diabetes mellitus with diabetic polyneuropathy: Secondary | ICD-10-CM | POA: Diagnosis present

## 2014-01-19 DIAGNOSIS — M1990 Unspecified osteoarthritis, unspecified site: Secondary | ICD-10-CM | POA: Diagnosis present

## 2014-01-19 DIAGNOSIS — M5410 Radiculopathy, site unspecified: Secondary | ICD-10-CM | POA: Diagnosis present

## 2014-01-19 DIAGNOSIS — I739 Peripheral vascular disease, unspecified: Secondary | ICD-10-CM | POA: Diagnosis present

## 2014-01-19 DIAGNOSIS — M4806 Spinal stenosis, lumbar region: Principal | ICD-10-CM | POA: Diagnosis present

## 2014-01-19 DIAGNOSIS — Z7982 Long term (current) use of aspirin: Secondary | ICD-10-CM | POA: Diagnosis not present

## 2014-01-19 DIAGNOSIS — E785 Hyperlipidemia, unspecified: Secondary | ICD-10-CM | POA: Diagnosis present

## 2014-01-19 DIAGNOSIS — Z981 Arthrodesis status: Secondary | ICD-10-CM

## 2014-01-19 DIAGNOSIS — Z7902 Long term (current) use of antithrombotics/antiplatelets: Secondary | ICD-10-CM | POA: Diagnosis not present

## 2014-01-19 DIAGNOSIS — Z79899 Other long term (current) drug therapy: Secondary | ICD-10-CM | POA: Diagnosis not present

## 2014-01-19 DIAGNOSIS — E780 Pure hypercholesterolemia: Secondary | ICD-10-CM | POA: Diagnosis present

## 2014-01-19 DIAGNOSIS — M109 Gout, unspecified: Secondary | ICD-10-CM | POA: Diagnosis present

## 2014-01-19 DIAGNOSIS — Z419 Encounter for procedure for purposes other than remedying health state, unspecified: Secondary | ICD-10-CM

## 2014-01-19 DIAGNOSIS — M79606 Pain in leg, unspecified: Secondary | ICD-10-CM | POA: Diagnosis present

## 2014-01-19 HISTORY — PX: ANTERIOR LAT LUMBAR FUSION: SHX1168

## 2014-01-19 LAB — GLUCOSE, CAPILLARY
Glucose-Capillary: 108 mg/dL — ABNORMAL HIGH (ref 70–99)
Glucose-Capillary: 124 mg/dL — ABNORMAL HIGH (ref 70–99)
Glucose-Capillary: 153 mg/dL — ABNORMAL HIGH (ref 70–99)

## 2014-01-19 SURGERY — ANTERIOR LATERAL LUMBAR FUSION 1 LEVEL
Anesthesia: General | Laterality: Left

## 2014-01-19 MED ORDER — ONDANSETRON HCL 4 MG/2ML IJ SOLN
INTRAMUSCULAR | Status: DC | PRN
  Administered 2014-01-19: 4 mg via INTRAVENOUS

## 2014-01-19 MED ORDER — THROMBIN 20000 UNITS EX SOLR
CUTANEOUS | Status: DC | PRN
  Administered 2014-01-19: 20 mL via TOPICAL

## 2014-01-19 MED ORDER — MORPHINE SULFATE (PF) 1 MG/ML IV SOLN
INTRAVENOUS | Status: AC
  Filled 2014-01-19: qty 25

## 2014-01-19 MED ORDER — PANTOPRAZOLE SODIUM 40 MG PO TBEC
40.0000 mg | DELAYED_RELEASE_TABLET | Freq: Every day | ORAL | Status: DC
  Administered 2014-01-19 – 2014-01-21 (×3): 40 mg via ORAL
  Filled 2014-01-19 (×2): qty 1

## 2014-01-19 MED ORDER — PIOGLITAZONE HCL 30 MG PO TABS
30.0000 mg | ORAL_TABLET | Freq: Every day | ORAL | Status: DC
  Administered 2014-01-19 – 2014-01-21 (×3): 30 mg via ORAL
  Filled 2014-01-19 (×4): qty 1

## 2014-01-19 MED ORDER — ACETAMINOPHEN 650 MG RE SUPP: 650.0000 mg | RECTAL | Status: DC | PRN

## 2014-01-19 MED ORDER — SODIUM CHLORIDE 0.9 % IJ SOLN
3.0000 mL | Freq: Two times a day (BID) | INTRAMUSCULAR | Status: DC
  Administered 2014-01-19 – 2014-01-21 (×3): 3 mL via INTRAVENOUS

## 2014-01-19 MED ORDER — GLYCOPYRROLATE 0.2 MG/ML IJ SOLN
INTRAMUSCULAR | Status: DC | PRN
  Administered 2014-01-19: 0.2 mg via INTRAVENOUS

## 2014-01-19 MED ORDER — LISINOPRIL 20 MG PO TABS
20.0000 mg | ORAL_TABLET | Freq: Every day | ORAL | Status: DC
  Administered 2014-01-19 – 2014-01-21 (×3): 20 mg via ORAL
  Filled 2014-01-19 (×3): qty 1

## 2014-01-19 MED ORDER — INDOMETHACIN 25 MG PO CAPS
25.0000 mg | ORAL_CAPSULE | Freq: Two times a day (BID) | ORAL | Status: DC
  Administered 2014-01-19 – 2014-01-21 (×4): 25 mg via ORAL
  Filled 2014-01-19 (×6): qty 1

## 2014-01-19 MED ORDER — DIPHENHYDRAMINE HCL 50 MG/ML IJ SOLN: 12.5000 mg | Freq: Four times a day (QID) | INTRAMUSCULAR | Status: DC | PRN

## 2014-01-19 MED ORDER — PHENOL 1.4 % MT LIQD: 1.0000 | OROMUCOSAL | Status: DC | PRN

## 2014-01-19 MED ORDER — OXYCODONE HCL 5 MG/5ML PO SOLN: 5.0000 mg | Freq: Once | ORAL | Status: DC | PRN

## 2014-01-19 MED ORDER — BISACODYL 5 MG PO TBEC: 5.0000 mg | DELAYED_RELEASE_TABLET | Freq: Every day | ORAL | Status: DC | PRN

## 2014-01-19 MED ORDER — GLIMEPIRIDE 4 MG PO TABS
4.0000 mg | ORAL_TABLET | Freq: Two times a day (BID) | ORAL | Status: DC
  Administered 2014-01-19 – 2014-01-21 (×4): 4 mg via ORAL
  Filled 2014-01-19 (×8): qty 1

## 2014-01-19 MED ORDER — GABAPENTIN 600 MG PO TABS
1200.0000 mg | ORAL_TABLET | Freq: Three times a day (TID) | ORAL | Status: DC
  Administered 2014-01-19 – 2014-01-21 (×6): 1200 mg via ORAL
  Filled 2014-01-19 (×9): qty 2

## 2014-01-19 MED ORDER — SENNOSIDES-DOCUSATE SODIUM 8.6-50 MG PO TABS: 1.0000 | ORAL_TABLET | Freq: Every evening | ORAL | Status: DC | PRN

## 2014-01-19 MED ORDER — MIDAZOLAM HCL 5 MG/5ML IJ SOLN
INTRAMUSCULAR | Status: DC | PRN
  Administered 2014-01-19: 2 mg via INTRAVENOUS

## 2014-01-19 MED ORDER — ONDANSETRON HCL 4 MG/2ML IJ SOLN: 4.0000 mg | INTRAMUSCULAR | Status: DC | PRN

## 2014-01-19 MED ORDER — MORPHINE SULFATE 2 MG/ML IJ SOLN: 1.0000 mg | INTRAMUSCULAR | Status: DC | PRN

## 2014-01-19 MED ORDER — EPHEDRINE SULFATE 50 MG/ML IJ SOLN
INTRAMUSCULAR | Status: DC | PRN
  Administered 2014-01-19 (×5): 10 mg via INTRAVENOUS

## 2014-01-19 MED ORDER — SUCCINYLCHOLINE CHLORIDE 20 MG/ML IJ SOLN
INTRAMUSCULAR | Status: DC | PRN
  Administered 2014-01-19: 50 mg via INTRAVENOUS

## 2014-01-19 MED ORDER — LACTATED RINGERS IV SOLN
INTRAVENOUS | Status: DC | PRN
  Administered 2014-01-19 (×2): via INTRAVENOUS

## 2014-01-19 MED ORDER — NEOSTIGMINE METHYLSULFATE 10 MG/10ML IV SOLN
INTRAVENOUS | Status: AC
  Filled 2014-01-19: qty 1

## 2014-01-19 MED ORDER — SODIUM CHLORIDE 0.9 % IV SOLN
INTRAVENOUS | Status: DC
  Administered 2014-01-19 – 2014-01-20 (×2): via INTRAVENOUS

## 2014-01-19 MED ORDER — ALLOPURINOL 300 MG PO TABS
300.0000 mg | ORAL_TABLET | Freq: Every day | ORAL | Status: DC
  Administered 2014-01-19 – 2014-01-21 (×3): 300 mg via ORAL
  Filled 2014-01-19 (×3): qty 1

## 2014-01-19 MED ORDER — EPHEDRINE SULFATE 50 MG/ML IJ SOLN
INTRAMUSCULAR | Status: AC
  Filled 2014-01-19: qty 1

## 2014-01-19 MED ORDER — HYDROMORPHONE HCL 1 MG/ML IJ SOLN
0.2500 mg | INTRAMUSCULAR | Status: DC | PRN
  Administered 2014-01-19 (×2): 0.5 mg via INTRAVENOUS

## 2014-01-19 MED ORDER — PHENYLEPHRINE 40 MCG/ML (10ML) SYRINGE FOR IV PUSH (FOR BLOOD PRESSURE SUPPORT)
PREFILLED_SYRINGE | INTRAVENOUS | Status: AC
  Filled 2014-01-19: qty 10

## 2014-01-19 MED ORDER — SODIUM CHLORIDE 0.9 % IJ SOLN
3.0000 mL | INTRAMUSCULAR | Status: DC | PRN
  Administered 2014-01-21: 3 mL via INTRAVENOUS
  Filled 2014-01-19: qty 3

## 2014-01-19 MED ORDER — LIDOCAINE HCL (CARDIAC) 20 MG/ML IV SOLN
INTRAVENOUS | Status: AC
  Filled 2014-01-19: qty 10

## 2014-01-19 MED ORDER — ARTIFICIAL TEARS OP OINT
TOPICAL_OINTMENT | OPHTHALMIC | Status: AC
  Filled 2014-01-19: qty 3.5

## 2014-01-19 MED ORDER — FENTANYL CITRATE 0.05 MG/ML IJ SOLN
INTRAMUSCULAR | Status: AC
  Filled 2014-01-19: qty 5

## 2014-01-19 MED ORDER — SODIUM CHLORIDE 0.9 % IJ SOLN
INTRAMUSCULAR | Status: AC
  Filled 2014-01-19: qty 10

## 2014-01-19 MED ORDER — ACETAMINOPHEN 160 MG/5ML PO SOLN
325.0000 mg | ORAL | Status: DC | PRN
  Filled 2014-01-19: qty 20.3

## 2014-01-19 MED ORDER — PHENYLEPHRINE HCL 10 MG/ML IJ SOLN
INTRAMUSCULAR | Status: DC | PRN
  Administered 2014-01-19 (×2): 80 ug via INTRAVENOUS

## 2014-01-19 MED ORDER — THROMBIN 20000 UNITS EX SOLR
CUTANEOUS | Status: AC
  Filled 2014-01-19: qty 20000

## 2014-01-19 MED ORDER — DOCUSATE SODIUM 100 MG PO CAPS
100.0000 mg | ORAL_CAPSULE | Freq: Two times a day (BID) | ORAL | Status: DC
  Administered 2014-01-19 – 2014-01-21 (×4): 100 mg via ORAL
  Filled 2014-01-19 (×5): qty 1

## 2014-01-19 MED ORDER — CEFAZOLIN SODIUM 1-5 GM-% IV SOLN
1.0000 g | Freq: Three times a day (TID) | INTRAVENOUS | Status: AC
  Administered 2014-01-19 – 2014-01-20 (×2): 1 g via INTRAVENOUS
  Filled 2014-01-19 (×2): qty 50

## 2014-01-19 MED ORDER — OMEPRAZOLE MAGNESIUM 20 MG PO TBEC: 40.0000 mg | DELAYED_RELEASE_TABLET | Freq: Every day | ORAL | Status: DC

## 2014-01-19 MED ORDER — ONDANSETRON HCL 4 MG/2ML IJ SOLN: 4.0000 mg | Freq: Four times a day (QID) | INTRAMUSCULAR | Status: DC | PRN

## 2014-01-19 MED ORDER — BUPIVACAINE-EPINEPHRINE 0.25% -1:200000 IJ SOLN
INTRAMUSCULAR | Status: DC | PRN
  Administered 2014-01-19: 10 mL

## 2014-01-19 MED ORDER — HYDROMORPHONE HCL 1 MG/ML IJ SOLN
INTRAMUSCULAR | Status: AC
  Filled 2014-01-19: qty 1

## 2014-01-19 MED ORDER — PROPOFOL 10 MG/ML IV BOLUS
INTRAVENOUS | Status: AC
  Filled 2014-01-19: qty 20

## 2014-01-19 MED ORDER — FENTANYL CITRATE 0.05 MG/ML IJ SOLN
INTRAMUSCULAR | Status: DC | PRN
  Administered 2014-01-19: 50 ug via INTRAVENOUS
  Administered 2014-01-19: 100 ug via INTRAVENOUS
  Administered 2014-01-19 (×4): 50 ug via INTRAVENOUS
  Administered 2014-01-19: 150 ug via INTRAVENOUS

## 2014-01-19 MED ORDER — SODIUM CHLORIDE 0.9 % IV SOLN: 250.0000 mL | INTRAVENOUS | Status: DC

## 2014-01-19 MED ORDER — DIAZEPAM 5 MG PO TABS
5.0000 mg | ORAL_TABLET | Freq: Four times a day (QID) | ORAL | Status: DC | PRN
  Administered 2014-01-19: 5 mg via ORAL
  Filled 2014-01-19: qty 1

## 2014-01-19 MED ORDER — NALOXONE HCL 0.4 MG/ML IJ SOLN: 0.4000 mg | INTRAMUSCULAR | Status: DC | PRN

## 2014-01-19 MED ORDER — DIPHENHYDRAMINE HCL 12.5 MG/5ML PO ELIX: 12.5000 mg | ORAL_SOLUTION | Freq: Four times a day (QID) | ORAL | Status: DC | PRN

## 2014-01-19 MED ORDER — ZOLPIDEM TARTRATE 5 MG PO TABS: 5.0000 mg | ORAL_TABLET | Freq: Every evening | ORAL | Status: DC | PRN

## 2014-01-19 MED ORDER — FUROSEMIDE 80 MG PO TABS
80.0000 mg | ORAL_TABLET | Freq: Every day | ORAL | Status: DC
  Administered 2014-01-19 – 2014-01-21 (×3): 80 mg via ORAL
  Filled 2014-01-19 (×3): qty 1

## 2014-01-19 MED ORDER — OXYCODONE HCL 5 MG PO TABS: 5.0000 mg | ORAL_TABLET | Freq: Once | ORAL | Status: DC | PRN

## 2014-01-19 MED ORDER — ROCURONIUM BROMIDE 50 MG/5ML IV SOLN
INTRAVENOUS | Status: AC
  Filled 2014-01-19: qty 1

## 2014-01-19 MED ORDER — OXYCODONE-ACETAMINOPHEN 5-325 MG PO TABS
1.0000 | ORAL_TABLET | ORAL | Status: DC | PRN
  Administered 2014-01-20 (×2): 1 via ORAL
  Administered 2014-01-21 (×2): 2 via ORAL
  Filled 2014-01-19 (×2): qty 2
  Filled 2014-01-19 (×3): qty 1

## 2014-01-19 MED ORDER — ACETAMINOPHEN 325 MG PO TABS: 325.0000 mg | ORAL_TABLET | ORAL | Status: DC | PRN

## 2014-01-19 MED ORDER — ALUM & MAG HYDROXIDE-SIMETH 200-200-20 MG/5ML PO SUSP: 30.0000 mL | Freq: Four times a day (QID) | ORAL | Status: DC | PRN

## 2014-01-19 MED ORDER — BUPIVACAINE-EPINEPHRINE (PF) 0.25% -1:200000 IJ SOLN
INTRAMUSCULAR | Status: AC
  Filled 2014-01-19: qty 30

## 2014-01-19 MED ORDER — MENTHOL 3 MG MT LOZG: 1.0000 | LOZENGE | OROMUCOSAL | Status: DC | PRN

## 2014-01-19 MED ORDER — ONDANSETRON HCL 4 MG/2ML IJ SOLN
INTRAMUSCULAR | Status: AC
  Filled 2014-01-19: qty 2

## 2014-01-19 MED ORDER — MORPHINE SULFATE (PF) 1 MG/ML IV SOLN
INTRAVENOUS | Status: DC
  Administered 2014-01-19: 3 mg via INTRAVENOUS
  Administered 2014-01-19 – 2014-01-20 (×2): via INTRAVENOUS
  Filled 2014-01-19: qty 25

## 2014-01-19 MED ORDER — MIDAZOLAM HCL 2 MG/2ML IJ SOLN
INTRAMUSCULAR | Status: AC
  Filled 2014-01-19: qty 2

## 2014-01-19 MED ORDER — ACETAMINOPHEN 325 MG PO TABS: 650.0000 mg | ORAL_TABLET | ORAL | Status: DC | PRN

## 2014-01-19 MED ORDER — PROPOFOL 10 MG/ML IV BOLUS
INTRAVENOUS | Status: DC | PRN
  Administered 2014-01-19: 160 mg via INTRAVENOUS
  Administered 2014-01-19: 40 mg via INTRAVENOUS

## 2014-01-19 MED ORDER — SODIUM CHLORIDE 0.9 % IJ SOLN: 9.0000 mL | INTRAMUSCULAR | Status: DC | PRN

## 2014-01-19 MED ORDER — LIDOCAINE HCL (CARDIAC) 20 MG/ML IV SOLN
INTRAVENOUS | Status: DC | PRN
  Administered 2014-01-19: 60 mg via INTRAVENOUS

## 2014-01-19 MED ORDER — FLEET ENEMA 7-19 GM/118ML RE ENEM: 1.0000 | ENEMA | Freq: Once | RECTAL | Status: AC | PRN

## 2014-01-19 MED ORDER — PRAVASTATIN SODIUM 40 MG PO TABS
40.0000 mg | ORAL_TABLET | Freq: Every day | ORAL | Status: DC
  Administered 2014-01-19 – 2014-01-20 (×2): 40 mg via ORAL
  Filled 2014-01-19 (×3): qty 1

## 2014-01-19 MED ORDER — VENLAFAXINE HCL ER 37.5 MG PO CP24
37.5000 mg | ORAL_CAPSULE | Freq: Every day | ORAL | Status: DC
  Administered 2014-01-20 – 2014-01-21 (×2): 37.5 mg via ORAL
  Filled 2014-01-19 (×3): qty 1

## 2014-01-19 MED ORDER — 0.9 % SODIUM CHLORIDE (POUR BTL) OPTIME
TOPICAL | Status: DC | PRN
  Administered 2014-01-19: 1000 mL

## 2014-01-19 MED ORDER — GLYCOPYRROLATE 0.2 MG/ML IJ SOLN
INTRAMUSCULAR | Status: AC
  Filled 2014-01-19: qty 2

## 2014-01-19 SURGICAL SUPPLY — 70 items
APL SKNCLS STERI-STRIP NONHPOA (GAUZE/BANDAGES/DRESSINGS) ×1
BENZOIN TINCTURE PRP APPL 2/3 (GAUZE/BANDAGES/DRESSINGS) ×2 IMPLANT
BLADE SURG 10 STRL SS (BLADE) ×3 IMPLANT
BLADE SURG ROTATE 9660 (MISCELLANEOUS) IMPLANT
BOLT 5.0X45 SPINAL (Bolt) ×2 IMPLANT
BOLT 5.0X50 SPINAL (Bolt) ×2 IMPLANT
CLOSURE WOUND 1/2 X4 (GAUZE/BANDAGES/DRESSINGS)
CORDS BIPOLAR (ELECTRODE) ×1 IMPLANT
COVER SURGICAL LIGHT HANDLE (MISCELLANEOUS) ×3 IMPLANT
DRAPE C-ARM 42X72 X-RAY (DRAPES) ×3 IMPLANT
DRAPE POUCH INSTRU U-SHP 10X18 (DRAPES) ×3 IMPLANT
DRAPE SURG 17X23 STRL (DRAPES) ×9 IMPLANT
DRAPE U-SHAPE 47X51 STRL (DRAPES) ×1 IMPLANT
DRSG MEPILEX BORDER 4X8 (GAUZE/BANDAGES/DRESSINGS) ×3 IMPLANT
DURAPREP 26ML APPLICATOR (WOUND CARE) ×3 IMPLANT
ELECT BLADE 6.5 EXT (BLADE) ×3 IMPLANT
ELECT CAUTERY BLADE 6.4 (BLADE) ×3 IMPLANT
ELECT REM PT RETURN 9FT ADLT (ELECTROSURGICAL) ×3
ELECTRODE REM PT RTRN 9FT ADLT (ELECTROSURGICAL) ×1 IMPLANT
GAUZE SPONGE 4X4 12PLY STRL (GAUZE/BANDAGES/DRESSINGS) ×2 IMPLANT
GAUZE SPONGE 4X4 16PLY XRAY LF (GAUZE/BANDAGES/DRESSINGS) ×3 IMPLANT
GLOVE BIO SURGEON STRL SZ7 (GLOVE) ×6 IMPLANT
GLOVE BIO SURGEON STRL SZ8 (GLOVE) ×3 IMPLANT
GLOVE BIOGEL PI IND STRL 7.0 (GLOVE) ×1 IMPLANT
GLOVE BIOGEL PI IND STRL 8 (GLOVE) ×1 IMPLANT
GLOVE BIOGEL PI INDICATOR 7.0 (GLOVE) ×2
GLOVE BIOGEL PI INDICATOR 8 (GLOVE) ×2
GOWN STRL REUS W/ TWL LRG LVL3 (GOWN DISPOSABLE) ×1 IMPLANT
GOWN STRL REUS W/ TWL XL LVL3 (GOWN DISPOSABLE) ×2 IMPLANT
GOWN STRL REUS W/TWL LRG LVL3 (GOWN DISPOSABLE) ×6
GOWN STRL REUS W/TWL XL LVL3 (GOWN DISPOSABLE) ×6
IMPLANT COROENT XL 12X22X55 ×2 IMPLANT
KIT BASIN OR (CUSTOM PROCEDURE TRAY) ×3 IMPLANT
KIT DILATOR XLIF 5 (KITS) IMPLANT
KIT NDL NVM5 EMG ELECT (KITS) IMPLANT
KIT NEEDLE NVM5 EMG ELECT (KITS) ×1 IMPLANT
KIT NEEDLE NVM5 EMG ELECTRODE (KITS) ×2
KIT ROOM TURNOVER OR (KITS) ×3 IMPLANT
KIT SURGICAL ACCESS MAXCESS 4 (KITS) ×2 IMPLANT
KIT XLIF (KITS) ×2
MIX DBX 10CC 35% BONE (Bone Implant) ×2 IMPLANT
NDL HYPO 25GX1X1/2 BEV (NEEDLE) ×1 IMPLANT
NDL SPNL 18GX3.5 QUINCKE PK (NEEDLE) ×1 IMPLANT
NEEDLE HYPO 25GX1X1/2 BEV (NEEDLE) ×3 IMPLANT
NEEDLE SPNL 18GX3.5 QUINCKE PK (NEEDLE) ×3 IMPLANT
NS IRRIG 1000ML POUR BTL (IV SOLUTION) ×3 IMPLANT
PACK LAMINECTOMY ORTHO (CUSTOM PROCEDURE TRAY) ×3 IMPLANT
PACK UNIVERSAL I (CUSTOM PROCEDURE TRAY) ×3 IMPLANT
PAD ARMBOARD 7.5X6 YLW CONV (MISCELLANEOUS) ×12 IMPLANT
PLATE DECADE 4HOLE SZ12 XLIF (Plate) ×2 IMPLANT
SCREW DECADE 5.5X50 (Screw) ×4 IMPLANT
SPONGE INTESTINAL PEANUT (DISPOSABLE) ×6 IMPLANT
SPONGE LAP 4X18 X RAY DECT (DISPOSABLE) ×3 IMPLANT
SPONGE SURGIFOAM ABS GEL 100 (HEMOSTASIS) ×2 IMPLANT
STRIP CLOSURE SKIN 1/2X4 (GAUZE/BANDAGES/DRESSINGS) IMPLANT
SURGIFLO TRUKIT (HEMOSTASIS) IMPLANT
SUT MNCRL AB 4-0 PS2 18 (SUTURE) ×3 IMPLANT
SUT PDS AB 1 CTX 36 (SUTURE) ×1 IMPLANT
SUT PROLENE 5 0 C 1 24 (SUTURE) IMPLANT
SUT SILK 2 0 TIES 10X30 (SUTURE) IMPLANT
SUT VIC AB 0 CT1 18XCR BRD 8 (SUTURE) ×1 IMPLANT
SUT VIC AB 0 CT1 8-18 (SUTURE) ×3
SUT VIC AB 2-0 CT2 18 VCP726D (SUTURE) ×5 IMPLANT
SYR BULB IRRIGATION 50ML (SYRINGE) ×3 IMPLANT
TAPE CLOTH SURG 4X10 WHT LF (GAUZE/BANDAGES/DRESSINGS) ×2 IMPLANT
TOWEL OR 17X24 6PK STRL BLUE (TOWEL DISPOSABLE) ×3 IMPLANT
TOWEL OR 17X26 10 PK STRL BLUE (TOWEL DISPOSABLE) ×3 IMPLANT
TRAY FOLEY CATH 16FR SILVER (SET/KITS/TRAYS/PACK) ×3 IMPLANT
WATER STERILE IRR 1000ML POUR (IV SOLUTION) ×1 IMPLANT
YANKAUER SUCT BULB TIP NO VENT (SUCTIONS) ×3 IMPLANT

## 2014-01-19 NOTE — Transfer of Care (Signed)
Immediate Anesthesia Transfer of Care Note  Patient: Marc [name]  Procedure(s) Performed: Procedure(s) with comments: LATERAL INTERBODY FUSION 1 LEVEL (Left) - Left lumbar 3-4 lateral interbody fusion with instrumentation, allograft  Patient Location: PACU  Anesthesia Type:General  Level of Consciousness: awake, alert  and oriented  Airway & Oxygen Therapy: Patient Spontanous Breathing and Patient connected to nasal cannula oxygen  Post-op Assessment: Report given to PACU RN and Post -op Vital signs reviewed and stable  Post vital signs: Reviewed and stable  Complications: No apparent anesthesia complications

## 2014-01-19 NOTE — Progress Notes (Signed)
Pt having lower back surgery, unable to place sacral foam prophylaxis due to area of incision.

## 2014-01-19 NOTE — Op Note (Signed)
NAMEMarland Kitchen  GARON, MELANDER NO.:  [id number]  MEDICAL RECORD NO.:  [id number]  LOCATION:  [id number]                        FACILITY:  [facility]  PHYSICIAN:  Phylliss Bob, MD      DATE OF BIRTH:  August 13, 1940  DATE OF PROCEDURE:  01/19/2014                              OPERATIVE REPORT   PREOPERATIVE DIAGNOSES: 1. L3-4 spinal stenosis. 2. Status post L4-5 decompression and fusion. 3. Prominent bilateral radicular leg pain.  POSTOPERATIVE DIAGNOSES: 1. L3-4 spinal stenosis. 2. Status post L4-5 decompression and fusion. 3. Prominent bilateral radicular leg pain.  PROCEDURE: 1. Anterior lumbar interbody fusion, L3-4, via left-sided lateral     interbody approach. 2. Insertion of interbody device x1 (Nuvasive interbody spacer). 3. Placement of anterior instrumentation, L3-4. 4. Use of morselized allograft - DBX mix.  SURGEON:  Phylliss Bob, MD  ASSISTANT:  Wille Celeste, PA-C  ANESTHESIA:  General endotracheal anesthesia.  COMPLICATIONS:  None.  DISPOSITION:  Stable.  ESTIMATED BLOOD LOSS:  Minimal.  INDICATIONS FOR SURGERY:  Briefly, Mr. Celli is a very pleasant 73- year-old male who is over 1 year status post L4-5 decompression and fusion.  The patient did excellent from the standpoint of that surgery. However, about a year later, he went on to have recurrence of bilateral leg pain.  An updated MRI did reveal prominent adjacent segment disease at L3-4.  Given his ongoing and prominent symptomatology, we did discuss proceeding with the procedure noted above.  OPERATIVE DETAILS:  On January 19, 2014, the patient brought to surgery and general endotracheal anesthesia was administered.  The patient was placed in the lateral decubitus position with the left side up.  The hips and knees were flexed.  All bony prominences were meticulously padded.  An axillary roll was placed.  The region of the left flank was prepped and draped.  Of note, EMG leads were placed in  all major muscle groups in the bilateral lower extremities.  I then appropriately positioned the table in order to obtain appropriate AP and lateral fluoroscopic images.  A time-out was performed.  I then made a left- sided transverse incision overlying the L3-4 interspace.  The external, an oblique, and transversalis musculature was dissected away.  The transversalis fascia was entered and the retroperitoneal space was identified.  The peritoneum was retracted anteriorly.  The psoas was noted.  I then sequentially placed dilators through the psoas musculature, which were docked over the L3-4 intervertebral space.  I did use triggered EMG while advancing the each of the dilators, and it was clear that there were no neurologic structures immediately in the vicinity of the dilators.  A retractor was placed and it was anchored to the upper aspect of the bed.  Appropriate positioning of the retractor was confirmed using AP and lateral fluoroscopy.  I then proceeded with a thorough and complete diskectomy.  Once the diskectomy was accomplished, I did place a series of trials.  I did feel that a 12 mm trial, 55 mm in length would be the most appropriate fit.  The appropriate size interbody spacer was packed with DBX mix and tamped into position in the usual fashion.  I was extremely pleased with the restoration  of the intervertebral space that was accomplished.  I then chose an appropriate sized 4 hole plate.  The plate was placed over the lateral aspect of the anterior spine across the L3-4 interspace.  The screws were then placed into the vertebral bodies.  An awl was used to prepare the holes.  I was very pleased with the press fit of each of the screws.  The screws were then locked to the plate.  The plate was slightly opened well placing the screws and the 2 halves of the plate were locked together using the final locking mechanism.  The wound was then copiously irrigated.  I was extremely  pleased with the APL and lateral fluoroscopic views.  There was no sustained abnormal EMG activity noted throughout the entire surgery.  The wound was then closed in layers with #1 Vicryl followed by 2-0 Vicryl, followed by 3-0 Monocryl.  Benzoin and Steri-Strips were applied followed by sterile dressing.  All instrument counts were correct at the termination of the procedure.  Of note, Pricilla Holm was my assistant throughout the surgery, and did aid in retraction, suctioning, and closure.     Phylliss Bob, MD     MD/MEDQ  D:  01/19/2014  T:  01/19/2014  Job:  [id number]

## 2014-01-19 NOTE — Anesthesia Preprocedure Evaluation (Addendum)
Anesthesia Evaluation  Patient identified by MRN, date of birth, ID band Patient awake    Reviewed: Allergy & Precautions, H&P , NPO status , Patient's Chart, lab work & pertinent test results  History of Anesthesia Complications Negative for: history of anesthetic complications  Airway Mallampati: III  TM Distance: >3 FB Neck ROM: Full    Dental  (+) Edentulous Upper, Poor Dentition,    Pulmonary neg shortness of breath, neg sleep apnea, neg COPDneg recent URI, Current Smoker,  breath sounds clear to auscultation        Cardiovascular hypertension, Pt. on medications + Peripheral Vascular Disease - Past MI, - CHF and - DOE - dysrhythmias - Cardiac Defibrillator Rhythm:Regular     Neuro/Psych Back pain with both leg symptoms  Neuromuscular disease negative psych ROS   GI/Hepatic Neg liver ROS, GERD-  Medicated and Controlled,  Endo/Other  diabetes, Type 2, Oral Hypoglycemic Agents  Renal/GU Renal InsufficiencyRenal disease     Musculoskeletal  (+) Arthritis -, Osteoarthritis,    Abdominal   Peds  Hematology negative hematology ROS (+)   Anesthesia Other Findings   Reproductive/Obstetrics                            Anesthesia Physical Anesthesia Plan  ASA: III  Anesthesia Plan: General   Post-op Pain Management:    Induction: Intravenous  Airway Management Planned: Oral ETT  Additional Equipment: None  Intra-op Plan:   Post-operative Plan: Extubation in OR  Informed Consent: I have reviewed the patients History and Physical, chart, labs and discussed the procedure including the risks, benefits and alternatives for the proposed anesthesia with the patient or authorized representative who has indicated his/her understanding and acceptance.   Dental advisory given  Plan Discussed with: CRNA and Surgeon  Anesthesia Plan Comments:         Anesthesia Quick Evaluation

## 2014-01-19 NOTE — Progress Notes (Signed)
Utilization review completed.  

## 2014-01-20 ENCOUNTER — Encounter (HOSPITAL_COMMUNITY): Payer: Self-pay | Admitting: General Practice

## 2014-01-20 LAB — GLUCOSE, CAPILLARY
GLUCOSE-CAPILLARY: 194 mg/dL — AB (ref 70–99)
GLUCOSE-CAPILLARY: 164 mg/dL — AB (ref 70–99)
GLUCOSE-CAPILLARY: 158 mg/dL — AB (ref 70–99)
Glucose-Capillary: 94 mg/dL (ref 70–99)

## 2014-01-20 NOTE — Plan of Care (Signed)
Problem: Consults Goal: Diagnosis - Spinal Surgery Outcome: Completed/Met Date Met:  01/20/14 Cervical Spine Fusion LATERAL INTERBODY FUSION 1 LEVEL

## 2014-01-20 NOTE — Evaluation (Signed)
Physical Therapy Evaluation Patient Details Name: Marc [name] MRN: [id number] DOB: 1940/12/26 Today's Date: 01/20/2014   History of Present Illness  Pt is a 73 y.o. male s/p L3/4 lateral IB fusion.  Clinical Impression  Patient is s/p above surgery resulting in the deficits listed below (see PT Problem List). Patient will benefit from skilled PT to increase their independence and safety with mobility (while adhering to their precautions) to allow discharge to the venue listed below. Pt is impulsive and questionable compliance with wear of back brace and back precautions.  Patient needs to practice stairs next session.        Follow Up Recommendations No PT follow up;Supervision/Assistance - 24 hour    Equipment Recommendations  None recommended by PT    Recommendations for Other Services OT consult     Precautions / Restrictions Precautions Precautions: Back Precaution Comments: educated on back precautions Required Braces or Orthoses: Spinal Brace Spinal Brace: Applied in sitting position;Thoracolumbosacral orthotic Restrictions Weight Bearing Restrictions: No      Mobility  Bed Mobility Overal bed mobility: Needs Assistance Bed Mobility: Rolling;Sidelying to Sit Rolling: Supervision Sidelying to sit: Min assist       General bed mobility comments: cues for log rolling technique and min (A) to eelvate trunk due to pain   Transfers Overall transfer level: Needs assistance Equipment used: Rolling walker (2 wheeled) Transfers: Sit to/from Stand Sit to Stand: Min guard         General transfer comment: cues for hand placement and safety   Ambulation/Gait Ambulation/Gait assistance: Supervision Ambulation Distance (Feet): 60 Feet Assistive device: Rolling walker (2 wheeled) Gait Pattern/deviations: Decreased stride length;Step-through pattern;Shuffle;Wide base of support Gait velocity: decreased; guarded due to pain Gait velocity interpretation: Below  normal speed for age/gender General Gait Details: cues for RW safety and back precautions with directional chagnes; pt with posterior LOB; recovered independently with use of RW  Stairs            Wheelchair Mobility    Modified Rankin (Stroke Patients Only)       Balance Overall balance assessment: Needs assistance;History of Falls Sitting-balance support: Feet supported;No upper extremity supported Sitting balance-Leahy Scale: Good     Standing balance support: During functional activity;Bilateral upper extremity supported Standing balance-Leahy Scale: Poor Standing balance comment: RW to balance; pt with LOB posteriorly                             Pertinent Vitals/Pain Pain Assessment: 0-10 Pain Score: 10-Worst pain ever Pain Location: 0 at rest 10/10 with movement in Lt hip region Pain Descriptors / Indicators: Burning Pain Intervention(s): Limited activity within patient's tolerance;Monitored during session;Patient requesting pain meds-RN notified;Repositioned    Home Living Family/patient expects to be discharged to:: Private residence Living Arrangements: Spouse/significant other;Other (Comment) (ex wife) Available Help at Discharge: Family;Available 24 hours/day Type of Home: Mobile home Home Access: Stairs to enter Entrance Stairs-Rails: Left Entrance Stairs-Number of Steps: 6-8 Home Layout: One level Home Equipment: Walker - 2 wheels;Cane - single point;Shower seat Additional Comments: pt has walk in shower with bench; reports he can have ex wife stay with him as long as needed    Prior Function Level of Independence: Independent         Comments: ambulates with cane PRN     Hand Dominance   Dominant Hand: Right    Extremity/Trunk Assessment   Upper Extremity Assessment: Defer to OT evaluation  Lower Extremity Assessment: LLE deficits/detail   LLE Deficits / Details: c/o pain with movement of Lt LE; can bear weight  through bil LEs equally  Cervical / Trunk Assessment: Normal  Communication   Communication: No difficulties  Cognition Arousal/Alertness: Awake/alert Behavior During Therapy: Impulsive Overall Cognitive Status: Within Functional Limits for tasks assessed       Memory: Decreased recall of precautions              General Comments General comments (skin integrity, edema, etc.): reviewed wear schedule of back brace    Exercises        Assessment/Plan    PT Assessment Patient needs continued PT services  PT Diagnosis Difficulty walking;Generalized weakness;Acute pain   PT Problem List Decreased strength;Decreased activity tolerance;Decreased balance;Decreased knowledge of precautions;Decreased safety awareness;Decreased knowledge of use of DME;Decreased mobility;Pain  PT Treatment Interventions DME instruction;Gait training;Stair training;Functional mobility training;Therapeutic activities;Therapeutic exercise;Balance training;Neuromuscular re-education;Patient/family education   PT Goals (Current goals can be found in the Care Plan section) Acute Rehab PT Goals Patient Stated Goal: to get out of here PT Goal Formulation: With patient Time For Goal Achievement: 01/23/14 Potential to Achieve Goals: Good    Frequency Min 5X/week   Barriers to discharge        Co-evaluation               End of Session Equipment Utilized During Treatment: Gait belt;Back brace Activity Tolerance: Patient tolerated treatment well Patient left: in chair;with call bell/phone within reach;with family/visitor present;with nursing/sitter in room Nurse Communication: Mobility status;Precautions         Time: 2549-8264 PT Time Calculation (min) (ACUTE ONLY): 26 min   Charges:   PT Evaluation $Initial PT Evaluation Tier I: 1 Procedure PT Treatments $Gait Training: 8-22 mins   PT G CodesGustavus Bryant, [place] 01/20/2014, 11:16 AM

## 2014-01-20 NOTE — Anesthesia Postprocedure Evaluation (Signed)
  Anesthesia Post-op Note  Patient: Marc [name]  Procedure(s) Performed: Procedure(s) with comments: LATERAL INTERBODY FUSION 1 LEVEL (Left) - Left lumbar 3-4 lateral interbody fusion with instrumentation, allograft  Patient Location: PACU  Anesthesia Type:General  Level of Consciousness: awake  Airway and Oxygen Therapy: Patient Spontanous Breathing  Post-op Pain: moderate  Post-op Assessment: Post-op Vital signs reviewed, Patient's Cardiovascular Status Stable, Respiratory Function Stable, Patent Airway, No signs of Nausea or vomiting and Pain level controlled  Post-op Vital Signs: Reviewed and stable  Last Vitals:  Filed Vitals:   01/20/14 0705  BP:   Pulse:   Temp:   Resp: 11    Complications: No apparent anesthesia complications

## 2014-01-20 NOTE — Progress Notes (Signed)
    Patient doing well Minimal back soreness Minimal discomfort in right knee, likely from R knee   Physical Exam: Filed Vitals:   01/20/14 0705  BP:   Pulse:   Temp:   Resp: 11    Dressing in place NVI  POD #1 s/p L3/4 lateral IB fusion  - up with PT/OT, encourage ambulation - D/C PCA - Percocet for pain, Valium for muscle spasms - likely d/c home today vs. Tomorrow - back precautions at all times

## 2014-01-20 NOTE — Progress Notes (Signed)
Occupational Therapy Evaluation Patient Details Name: Marc [name] MRN: [id number] DOB: [birth year] Today's Date: 01/20/2014    History of Present Illness Pt is a 73 y.o. male s/p L3/4 lateral IB fusion.   Clinical Impression   PTA pt lived at home alone and used [facility] PRN for functional mobility. He was independent with ADLs with use of AE. Pt currently limited by pain and decreased ROM and presents with mild impulsivity. Educated pt on back precautions and donning/doffing back brace. Pt has AE and has no LB ADL concerns. Pt does not feel that he needs further OT and will defer to PT to address functional mobility. Acute OT to sign off.     Follow Up Recommendations  No OT follow up;Supervision/Assistance - 24 hour    Equipment Recommendations  None recommended by OT    Recommendations for Other Services       Precautions / Restrictions Precautions Precautions: Back Precaution Booklet Issued: Yes (comment) Precaution Comments: educated on back precautions Required Braces or Orthoses: Spinal Brace Spinal Brace: Applied in sitting position;Thoracolumbosacral orthotic Restrictions Weight Bearing Restrictions: No      Mobility Bed Mobility Overal bed mobility: Needs Assistance Bed Mobility: Rolling;Sidelying to Sit Rolling: Supervision Sidelying to sit: Min assist       General bed mobility comments: Pt sitting up in recliner. Discussed log roll technique.   Transfers Overall transfer level: Needs assistance Equipment used: Rolling walker (2 wheeled) Transfers: Sit to/from Stand Sit to Stand: Supervision         General transfer comment: cues for hand placement and safety     Balance Overall balance assessment: Needs assistance;History of Falls Sitting-balance support: Feet supported;No upper extremity supported Sitting balance-Leahy Scale: Good     Standing balance support: During functional activity;Bilateral upper extremity supported Standing  balance-Leahy Scale: Poor Standing balance comment: RW to balance; pt with LOB posteriorly                            ADL Overall ADL's : Needs assistance/impaired Eating/Feeding: Independent;Sitting   Grooming: Wash/dry hands;Min guard;Standing   Upper Body Bathing: Set up;Sitting;Supervision/ safety   Lower Body Bathing: Sit to/from stand;With adaptive equipment;Set up;Supervison/ safety   Upper Body Dressing : Maximal assistance;Sitting Upper Body Dressing Details (indicate cue type and reason): (A) for brace Lower Body Dressing: With adaptive equipment;Sit to/from stand;Supervision/safety;Set up   Toilet Transfer: Supervision/safety;Ambulation           Functional mobility during ADLs: Supervision/safety;Rolling walker General ADL Comments: Pt slightly impulsive, but likely baseline personality. VC's to slow down and for back precautions. Pt has AE at home from previous surgery and discussed use of AE for LB ADLs to adhere to back precautions. Educated pt on safe donning/doffing of back brace and sequencing.         Perception Perception Perception Tested?: No   Praxis Praxis Praxis tested?: Within functional limits    Pertinent Vitals/Pain Pain Assessment: 0-10 Pain Score: 6  Pain Location: Lt hip with movement Pain Descriptors / Indicators: Burning Pain Intervention(s): Monitored during session;Repositioned     Hand Dominance Right   Extremity/Trunk Assessment Upper Extremity Assessment Upper Extremity Assessment: Overall WFL for tasks assessed   Lower Extremity Assessment Lower Extremity Assessment: Defer to PT evaluation LLE Deficits / Details: c/o pain with movement of Lt LE; can bear weight through bil LEs equally LLE: Unable to fully assess due to pain   Cervical / Trunk Assessment Cervical /  Trunk Assessment: Normal   Communication Communication Communication: No difficulties   Cognition Arousal/Alertness: Awake/alert Behavior  During Therapy: Impulsive Overall Cognitive Status: Within Functional Limits for tasks assessed       Memory: Decreased recall of precautions                        Home Living Family/patient expects to be discharged to:: Private residence Living Arrangements: Spouse/significant other;Other (Comment) (ex wife) Available Help at Discharge: Family;Available 24 hours/day Type of Home: Mobile home Home Access: Stairs to enter Entrance Stairs-Number of Steps: 6-8 Entrance Stairs-Rails: Left Home Layout: One level     Bathroom Shower/Tub: Occupational psychologist: Standard     Home Equipment: Environmental consultant - 2 wheels;Cane - single point;Shower seat   Additional Comments: pt has walk in shower with bench; reports he can have ex wife stay with him as long as needed      Prior Functioning/Environment Level of Independence: Independent        Comments: ambulates with cane PRN    OT Diagnosis: Generalized weakness;Acute pain    End of Session Equipment Utilized During Treatment: Gait belt;Rolling walker;Back brace  Activity Tolerance: Patient tolerated treatment well Patient left: in chair;with call bell/phone within reach   Time: 1134-1159 OT Time Calculation (min): 25 min Charges:  OT General Charges $OT Visit: 1 Procedure OT Evaluation $Initial OT Evaluation Tier I: 1 Procedure OT Treatments $Self Care/Home Management : 8-22 mins G-Codes:    Juluis Rainier Jan 29, 2014, 12:53 PM   Secundino Ginger Lynetta Mare, OTR/L Occupational Therapist [phone] (pager)

## 2014-01-21 LAB — GLUCOSE, CAPILLARY
Glucose-Capillary: 92 mg/dL (ref 70–99)
Glucose-Capillary: 155 mg/dL — ABNORMAL HIGH (ref 70–99)

## 2014-01-21 NOTE — Progress Notes (Addendum)
Physical Therapy Treatment Patient Details Name: Marc [name] MRN: [id number] DOB: 04/30/1940 Today's Date: 01/21/2014    History of Present Illness Pt is a 73 y.o. male s/p L3/4 lateral IB fusion.    PT Comments    Patient anxious to go home.  Was able to negotiate stairs with min guard assist for safety.  Doing well with mobility and gait.  Able to doff brace with min assist.  Should be able to d/c home today with 24 hour assistance.  Follow Up Recommendations  No PT follow up;Supervision/Assistance - 24 hour     Equipment Recommendations  None recommended by PT    Recommendations for Other Services       Precautions / Restrictions Precautions Precautions: Back Precaution Comments: Able to state back precautions.  Needs cues for integration into mobility. Required Braces or Orthoses: Spinal Brace Spinal Brace: Thoracolumbosacral orthotic;Applied in sitting position Restrictions Weight Bearing Restrictions: No    Mobility  Bed Mobility Overal bed mobility: Modified Independent Bed Mobility: Rolling;Sidelying to Sit Rolling: Modified independent (Device/Increase time) Sidelying to sit: Modified independent (Device/Increase time)       General bed mobility comments: Patient able to perform bed mobility with use of bed rail.  Transfers Overall transfer level: Needs assistance Equipment used: Rolling walker (2 wheeled) Transfers: Sit to/from Stand Sit to Stand: Supervision         General transfer comment: cues for hand placement and safety   Ambulation/Gait Ambulation/Gait assistance: Modified independent (Device/Increase time) Ambulation Distance (Feet): 150 Feet Assistive device: Rolling walker (2 wheeled) Gait Pattern/deviations: Step-through pattern;Decreased stride length;Trunk flexed Gait velocity: Decreased Gait velocity interpretation: Below normal speed for age/gender General Gait Details: Verbal cues for safe use of RW and with gait.  Cues to  stand upright.   Stairs Stairs: Yes Stairs assistance: Min guard Stair Management: One rail Right;Step to pattern;Forwards Number of Stairs: 4 General stair comments: Verbal cues for safe negotiation of stairs with use of 1 rail.  Patient able to perform with min guard assist for safety.  Wheelchair Mobility    Modified Rankin (Stroke Patients Only)       Balance                                    Cognition Arousal/Alertness: Awake/alert Behavior During Therapy: Impulsive Overall Cognitive Status: Within Functional Limits for tasks assessed                      Exercises      General Comments        Pertinent Vitals/Pain Pain Assessment: 0-10 Pain Score: 6  Pain Location: Back and Rt knee pain (chronic) Pain Descriptors / Indicators: Aching;Sore Pain Intervention(s): Monitored during session;Patient requesting pain meds-RN notified    Home Living                      Prior Function            PT Goals (current goals can now be found in the care plan section) Progress towards PT goals: Progressing toward goals    Frequency  Min 5X/week    PT Plan Current plan remains appropriate    Co-evaluation             End of Session Equipment Utilized During Treatment: Gait belt;Back brace Activity Tolerance: Patient tolerated treatment well Patient left: in bed;with call bell/phone  within reach (sitting EOB)     Time: 1610-9604 PT Time Calculation (min) (ACUTE ONLY): 11 min  Charges:  $Gait Training: 8-22 mins                    G Codes:      Despina Pole 02-15-2014, 12:20 PM Carita Pian. Sanjuana Kava, [place] Pager [phone]

## 2014-01-21 NOTE — Progress Notes (Signed)
   PATIENT ID: Marc [name]   2 Days Post-Op Procedure(s) (LRB): LATERAL INTERBODY FUSION 1 LEVEL (Left)  Subjective: Reports doing well. Minimal pain. Was up walking with PT with no complaints or concerns.   Objective:  Filed Vitals:   01/21/14 0558  BP: 108/56  Pulse: 56  Temp: 98.8 F (37.1 C)  Resp: 16     Witting comfortably on edge of bed Dressing c/d/i Distally LE with full fxn, NVI   Labs:  No results for input(s): HGB in the last 72 hours.No results for input(s): WBC, RBC, HCT, PLT in the last 72 hours.No results for input(s): NA, K, CL, CO2, BUN, CREATININE, GLUCOSE, CALCIUM in the last 72 hours.  Assessment and Plan: Doing well day 1 po Pain controlled and cleared by PT Okay to d/c home today Script in chart, orders in epic

## 2014-01-22 NOTE — Discharge Summary (Signed)
Patient ID: Marc [name] MRN: [id number] DOB/AGE: [birth year] 73 y.o.  Admit date: 01/19/2014 Discharge date: 01/21/2014  Admission Diagnoses:  Active Problems:   Radiculopathy   Discharge Diagnoses:  Same  Past Medical History  Diagnosis Date  . Hypertension   . Ulcer disease   . Colon polyp   . Gout   . Bradycardia   . Hypercholesteremia   . PAD (peripheral artery disease)   . Chronic bronchitis   . Type II diabetes mellitus   . History of GI bleed 2007  . Gout   . Tobacco abuse disorder 03/10/2012  . GERD (gastroesophageal reflux disease)   . Pneumonia     denies    Surgeries: Procedure(s): LATERAL INTERBODY FUSION 1 LEVEL on 01/19/2014   Consultants:    Discharged Condition: Improved  Hospital Course: Marc [name] is an 73 y.o. male who was admitted 01/19/2014 for operative treatment of radiculopathy. Patient has severe unremitting pain that affects sleep, daily activities, and work/hobbies. After pre-op clearance the patient was taken to the operating room on 01/19/2014 and underwent  Procedure(s): LATERAL INTERBODY FUSION 1 LEVEL.    Patient was given perioperative antibiotics: Anti-infectives    Start     Dose/Rate Route Frequency Ordered Stop   01/19/14 1700  ceFAZolin (ANCEF) IVPB 1 g/50 mL premix     1 g100 mL/hr over 30 Minutes Intravenous Every 8 hours 01/19/14 1442 01/20/14 0211   01/19/14 0600  ceFAZolin (ANCEF) IVPB 2 g/50 mL premix     2 g100 mL/hr over 30 Minutes Intravenous On call to O.R. 01/18/14 1347 01/19/14 0843       Patient was given sequential compression devices, early ambulation to prevent DVT.  Patient benefited maximally from hospital stay and there were no complications.    Recent vital signs: Patient Vitals for the past 24 hrs:  BP Pulse  01/21/14 1002 140/80 mmHg (!) 52     Recent laboratory studies: No results for input(s): WBC, HGB, HCT, PLT, NA, K, CL, CO2, BUN, CREATININE, GLUCOSE, INR, CALCIUM in the last 72  hours.  Invalid input(s): PT, 2   Discharge Medications:     Medication List    STOP taking these medications        aspirin EC 325 MG tablet     indomethacin 25 MG capsule  Commonly known as:  INDOCIN     oxyCODONE 15 MG immediate release tablet  Commonly known as:  ROXICODONE      TAKE these medications        allopurinol 300 MG tablet  Commonly known as:  ZYLOPRIM  Take 300 mg by mouth daily.     clopidogrel 75 MG tablet  Commonly known as:  PLAVIX  Take 1 tablet by mouth daily.     furosemide 80 MG tablet  Commonly known as:  LASIX  Take 1 tablet by mouth daily.     gabapentin 600 MG tablet  Commonly known as:  NEURONTIN  Take 1,200 mg by mouth every 8 (eight) hours.     glimepiride 4 MG tablet  Commonly known as:  AMARYL  Take 4 mg by mouth 2 (two) times daily.     lisinopril 20 MG tablet  Commonly known as:  PRINIVIL,ZESTRIL  Take 20 mg by mouth daily.     lovastatin 40 MG tablet  Commonly known as:  MEVACOR  Take 40 mg by mouth at bedtime.     omeprazole 20 MG tablet  Commonly known as:  PRILOSEC  OTC  Take 40 mg by mouth daily.     pioglitazone 30 MG tablet  Commonly known as:  ACTOS  Take 30 mg by mouth daily.     predniSONE 10 MG tablet  Commonly known as:  DELTASONE  Take 10 mg by mouth daily with breakfast.     venlafaxine XR 37.5 MG 24 hr capsule  Commonly known as:  EFFEXOR-XR  Take 1 capsule (37.5 mg total) by mouth daily with breakfast.        Diagnostic Studies: Dg Chest 2 View  01/12/2014   CLINICAL DATA:  Pre-admission exam prior to back surgery  EXAM: CHEST  2 VIEW  COMPARISON:  PA and lateral chest x-ray of November 27, 2012  FINDINGS: The lungs are mildly hyperinflated with hemidiaphragm flattening. There is no focal infiltrate. The heart and pulmonary vascularity are normal. There is stable tortuosity of the descending thoracic aorta. The bony thorax exhibits no acute abnormality. There are degenerative disc changes at  multiple thoracic levels.  IMPRESSION: COPD.  There is no active cardiopulmonary disease.   Electronically Signed   By: David  Martinique   On: 01/12/2014 11:40   Dg Lumbar Spine 2-3 Views  01/19/2014   CLINICAL DATA:  Operative imaging for L3-L4 spinal surgery.  EXAM: DG C-ARM 61-120 MIN; LUMBAR SPINE - 2-3 VIEW  : FINDINGS:  Two images show placement of a left lateral fixation plate and screws at the L3-L4 level. The orthopedic hardware is well-seated. There is a radiolucent disc spacer maintaining disc height at this level.  A posterior fusion has been performed at L4-L5. This is stable from older exams dated 12/03/2012.  IMPRESSION: Imaging from surgical fusion of L3-L4, along the left lateral aspect of the vertebral bodies. No evidence of an operative complication.   Electronically Signed   By: Lajean Manes M.D.   On: 01/19/2014 11:57   Dg C-arm 61-120 Min  01/19/2014   CLINICAL DATA:  Operative imaging for L3-L4 spinal surgery.  EXAM: DG C-ARM 61-120 MIN; LUMBAR SPINE - 2-3 VIEW  : FINDINGS:  Two images show placement of a left lateral fixation plate and screws at the L3-L4 level. The orthopedic hardware is well-seated. There is a radiolucent disc spacer maintaining disc height at this level.  A posterior fusion has been performed at L4-L5. This is stable from older exams dated 12/03/2012.  IMPRESSION: Imaging from surgical fusion of L3-L4, along the left lateral aspect of the vertebral bodies. No evidence of an operative complication.   Electronically Signed   By: Lajean Manes M.D.   On: 01/19/2014 11:57    Disposition: 01-Home or Self Care      Discharge Instructions    Call MD / Call 911    Complete by:  As directed   If you experience chest pain or shortness of breath, CALL 911 and be transported to the hospital emergency room.  If you develope a fever above 101 F, pus (white drainage) or increased drainage or redness at the wound, or calf pain, call your surgeon's office.     Constipation  Prevention    Complete by:  As directed   Drink plenty of fluids.  Prune juice may be helpful.  You may use a stool softener, such as Colace (over the counter) 100 mg twice a day.  Use MiraLax (over the counter) for constipation as needed.     Diet - low sodium heart healthy    Complete by:  As directed  Increase activity slowly as tolerated    Complete by:  As directed               Signed: Grier Mitts 01/22/2014, 9:03 AM

## 2014-01-24 ENCOUNTER — Encounter (HOSPITAL_COMMUNITY): Payer: Self-pay | Admitting: Orthopedic Surgery

## 2014-04-12 ENCOUNTER — Other Ambulatory Visit (HOSPITAL_COMMUNITY): Payer: Self-pay | Admitting: Family Medicine

## 2014-04-12 DIAGNOSIS — M549 Dorsalgia, unspecified: Secondary | ICD-10-CM

## 2014-04-14 ENCOUNTER — Ambulatory Visit (HOSPITAL_COMMUNITY)
Admission: RE | Admit: 2014-04-14 | Discharge: 2014-04-14 | Disposition: A | Discharge status: Home / Self Care | Payer: Medicare Other | Source: Ambulatory Visit | Attending: Family Medicine | Admitting: Family Medicine

## 2014-04-14 DIAGNOSIS — I10 Essential (primary) hypertension: Secondary | ICD-10-CM | POA: Insufficient documentation

## 2014-04-14 DIAGNOSIS — M549 Dorsalgia, unspecified: Secondary | ICD-10-CM | POA: Insufficient documentation

## 2014-07-04 ENCOUNTER — Other Ambulatory Visit: Payer: Self-pay | Admitting: Family Medicine

## 2014-07-04 DIAGNOSIS — F17200 Nicotine dependence, unspecified, uncomplicated: Secondary | ICD-10-CM

## 2014-07-11 ENCOUNTER — Telehealth: Payer: Self-pay | Admitting: Acute Care

## 2014-07-11 ENCOUNTER — Ambulatory Visit
Admission: RE | Admit: 2014-07-11 | Discharge: 2014-07-11 | Disposition: A | Discharge status: Home / Self Care | Payer: Medicare Other | Source: Ambulatory Visit | Attending: Family Medicine | Admitting: Family Medicine

## 2014-07-11 ENCOUNTER — Other Ambulatory Visit: Payer: Self-pay | Admitting: Acute Care

## 2014-07-11 DIAGNOSIS — Z87891 Personal history of nicotine dependence: Secondary | ICD-10-CM

## 2014-07-11 DIAGNOSIS — F17200 Nicotine dependence, unspecified, uncomplicated: Secondary | ICD-10-CM

## 2014-07-11 NOTE — Telephone Encounter (Signed)
I received a call today from Madie Reno, MD with  Results of this patient's Lung Cancer Screening CT scan that was ordered by the patients PCP. This patient has a Lung RADS 4 B pulmonary nodule in his left lower lobe that, per Dr. Weber Cooks , requires a PET scan as follow up to determine likelihood of malignant bronchogenic neoplasm / early malignant mesothelioma, or rounded atelectasis due to asbestos exposure. This screening CT scan was ordered by Dr. Claris Gower  of  [place]. We have not evaluated this patient for screening, or had a shared decision making visit with him. This was done by Dr. Claris Gower. We are assisting in getting the PET scan scheduled and making sure the patient is filtered into the Lung Cancer Screening Program's flow for immediate follow up and referral to appropriate medical treatment based on findings of the CT and PET scan. I called the patient and have scheduled him for the earliest available time, which is Tuesday, June 21 at 7 am. He knows to be at [facility] at 6:30 am, on the 1st floor. We discussed the fact that he needs to skip his morning dose of diabetic medication, no insulin or sugar for 8 hours prior to the scan, and nothing to eat or drink 6 hours prior to the scan. I had him write down the date , time and location of the scan, and all of the instructions I gave him. I told him to take his medications with him to the scan, so that he can eat and then take his medications as soon as the scan is completed.He read the information back to me, and verbalized understanding of what we had discussed. I have pre-certified him with his [employer] Medicare Complete plan. Auth code is [id number]. This is good for 45 days until 08/25/14. Mr. Flannery has my contact information if he has any further questions he needs to ask. He does not have a history of claustrophobia( I asked him specifically, and he stated that that is not a problem for him).

## 2014-07-11 NOTE — Telephone Encounter (Signed)
When verifying the order had been placed and the scheduled scan was in the Epic system, I noted that the scheduler has specified that the patient will need a driver. I called the patient back to make sure he was aware he would need a driver. He verbalized understanding. I told him to call me if he was unable to find a driver so that I can reschedule the PET scan.

## 2014-07-19 ENCOUNTER — Encounter (HOSPITAL_COMMUNITY)
Admission: RE | Admit: 2014-07-19 | Discharge: 2014-07-19 | Disposition: A | Discharge status: Home / Self Care | Payer: Medicare Other | Source: Ambulatory Visit | Attending: Acute Care | Admitting: Acute Care

## 2014-07-19 DIAGNOSIS — R911 Solitary pulmonary nodule: Secondary | ICD-10-CM | POA: Diagnosis not present

## 2014-07-19 DIAGNOSIS — Z87891 Personal history of nicotine dependence: Secondary | ICD-10-CM

## 2014-07-19 LAB — GLUCOSE, CAPILLARY: GLUCOSE-CAPILLARY: 163 mg/dL — AB (ref 65–99)

## 2014-07-19 MED ORDER — FLUDEOXYGLUCOSE F - 18 (FDG) INJECTION
9.7800 | Freq: Once | INTRAVENOUS | Status: AC | PRN
  Administered 2014-07-19: 9.78 via INTRAVENOUS

## 2014-07-20 ENCOUNTER — Telehealth: Payer: Self-pay | Admitting: Acute Care

## 2014-07-20 ENCOUNTER — Other Ambulatory Visit: Payer: Self-pay | Admitting: Cardiothoracic Surgery

## 2014-07-20 ENCOUNTER — Encounter: Payer: Self-pay | Admitting: Cardiothoracic Surgery

## 2014-07-20 ENCOUNTER — Institutional Professional Consult (permissible substitution) (INDEPENDENT_AMBULATORY_CARE_PROVIDER_SITE_OTHER): Payer: Medicare Other | Admitting: Cardiothoracic Surgery

## 2014-07-20 ENCOUNTER — Other Ambulatory Visit: Payer: Self-pay

## 2014-07-20 VITALS — BP 135/89 | HR 110 | Resp 20 | Ht 73.0 in | Wt 195.0 lb

## 2014-07-20 DIAGNOSIS — J984 Other disorders of lung: Secondary | ICD-10-CM

## 2014-07-20 DIAGNOSIS — I739 Peripheral vascular disease, unspecified: Secondary | ICD-10-CM

## 2014-07-20 DIAGNOSIS — I712 Thoracic aortic aneurysm, without rupture, unspecified: Secondary | ICD-10-CM

## 2014-07-20 DIAGNOSIS — R911 Solitary pulmonary nodule: Secondary | ICD-10-CM

## 2014-07-20 DIAGNOSIS — L97909 Non-pressure chronic ulcer of unspecified part of unspecified lower leg with unspecified severity: Secondary | ICD-10-CM | POA: Diagnosis not present

## 2014-07-20 NOTE — Progress Notes (Signed)
[address]       [place] [address]             [place] Record #[id number] Date of Birth: 04-16-1940  Referring: [name], * Primary Care: [name], MD  Chief Complaint:    Chief Complaint  Patient presents with  . Thoracic Aortic Aneurysm    Surgical eval, PET Scan 07/19/14, Chest CT 07/11/14  . Lung Lesion    History of Present Illness:    Marc [name] 74 y.o. male is seen in the office  today for abnormal screening CT scan of the chest. The patient has a long history of smoking since age 38 up to 2 packs a day for more than 30 years. He has known peripheral vascular disease, history of abnormal EKG but no known acute myocardial infarction. He is limited in his physical ability because of chronic back next in knee pain, status post back surgery 3 in right knee replacement. He has known peripheral vascular disease having stents placed in right and left iliac arteries by Dr. Gwenlyn Found. He complains of chronic leg pain bilateral and numbness in his right foot.  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   Because of the patient's smoking history Dr. Arelia Sneddon recommended a screening CT scan of the chest and the patient's referred to thoracic surgery.      Current Activity/ Functional Status:  Patient is independent with mobility/ambulation, transfers, ADL's, IADL's. patient does have difficulty with ambulation frequently uses a cane when out in public because of leg weakness right greater than left. He notes that he is not as active as he has been over the years, stays at home and watches TV a lot   Zubrod Score: At the time of surgery this patient's most appropriate activity status/level should be  described as: []     0    Normal activity, no symptoms [x]     1    Restricted in physical strenuous activity but ambulatory, able to do out light work []     2    Ambulatory and capable of self care, unable to do work activities, up and about               >50 % of waking hours                              []     3    Only limited self care, in bed greater than 50% of waking hours []     4    Completely disabled, no self care, confined to bed or chair []     5    Moribund   Past Medical History  Diagnosis Date  . Hypertension   . Ulcer disease   . Colon polyp   . Gout   . Bradycardia   . Hypercholesteremia   . PAD (peripheral artery disease)   . Chronic bronchitis   . Type II diabetes mellitus   . History of GI bleed 2007  . Gout   . Tobacco abuse disorder 03/10/2012  . GERD (gastroesophageal reflux disease)   . Pneumonia  denies    Past Surgical History  Procedure Laterality Date  . Splenectomy  ~ 1957  . Anterior cervical decomp/discectomy fusion  ~ 2007  . Upper endoscopy w/ sclerotherapy  ~ 2007  . Polypectomy    . Colonoscopy    . Iliac artery stent      left common/notes (02/04/2012)  . Hernia repair  ~ 2007    [facility] (02/04/2012)  . Tonsillectomy  ~ 1947  . Lumbar laminectomy  11/14  . Total knee arthroplasty Right 02/03/2013    Procedure: TOTAL KNEE ARTHROPLASTY;  Surgeon: [name], MD;  Location: [facility];  Service: Orthopedics;  Laterality: Right;  . Lexiscan stress test  01/24/12  . Lower extremity angiogram N/A 02/04/2012    Procedure: LOWER EXTREMITY ANGIOGRAM;  Surgeon: Lorretta Harp, MD;  Location: [facility] CATH LAB;  Service: Cardiovascular;  Laterality: N/A;  . Percutaneous stent intervention Left 02/04/2012    Procedure: PERCUTANEOUS STENT INTERVENTION;  Surgeon: Lorretta Harp, MD;  Location: [facility] CATH LAB;  Service: Cardiovascular;  Laterality: Left;  lt ext iliac stent  . Replacement total knee Right   . Back surgery    . Anterior fusion lumbar spine  01/20/2014    . Anterior lat lumbar fusion Left 01/19/2014    Procedure: LATERAL INTERBODY FUSION 1 LEVEL;  Surgeon: Sinclair Ship, MD;  Location: [facility];  Service: Orthopedics;  Laterality: Left;  Left lumbar 3-4 lateral interbody fusion with instrumentation, allograft    Family History  Problem Relation Age of Onset  . Colon cancer Paternal Uncle     Uncle  . Heart disease Father   . Heart disease Mother     History   Social History  . Marital Status: Legally Separated    Spouse Name: N/A  . Number of Children: 2  . Years of Education: N/A   Occupational History  . retired   . cattle farm     owner   Social History Main Topics  . Smoking status: Current Every Day Smoker -- 2.00 packs/day for 58 years    Types: Cigarettes  . Smokeless tobacco: Former Systems developer    Types: Chew  . Alcohol Use: Yes     Comment:  quit for 22 yr; egg nog christmas  . Drug Use: No  . Sexual Activity: No   Other Topics Concern  . Not on file   Social History Narrative   Patient lives at home with his spouse.   Caffeine Use: 2 cups daily    History  Smoking status  . Current Every Day Smoker -- 2.00 packs/day for 58 years  . Types: Cigarettes  Smokeless tobacco  . Former Systems developer  . Types: Chew    History  Alcohol Use  . Yes    Comment:  quit for 22 yr; egg nog christmas     No Known Allergies  Current Outpatient Prescriptions  Medication Sig Dispense Refill  . allopurinol (ZYLOPRIM) 300 MG tablet Take 300 mg by mouth daily.    . clopidogrel (PLAVIX) 75 MG tablet Take 1 tablet by mouth daily.    . furosemide (LASIX) 80 MG tablet Take 40 mg by mouth daily.     [name] gabapentin (NEURONTIN) 600 MG tablet Take 1,200 mg by mouth every 8 (eight) hours.     [name] glimepiride (AMARYL) 4 MG tablet Take 4 mg by mouth 2 (two) times daily.     [name] lisinopril (PRINIVIL,ZESTRIL) 20 MG tablet Take 20 mg by mouth daily.      [name]  lovastatin (MEVACOR) 40 MG tablet Take 40 mg by mouth at bedtime.    [name] omeprazole  (PRILOSEC OTC) 20 MG tablet Take 40 mg by mouth daily.     [name] venlafaxine XR (EFFEXOR-XR) 37.5 MG 24 hr capsule Take 1 capsule (37.5 mg total) by mouth daily with breakfast. 30 capsule 3   No current facility-administered medications for this visit.     Review of Systems:     Cardiac Review of Systems: Y or N  Chest Pain [ n   ]  Resting SOB [  n ] Exertional SOB  Blue.Reese  ]  Orthopnea [ n ]   Pedal Edema [ y  ]    Palpitations [ n ] Syncope  [ n ]   Presyncope [  n ]  General Review of Systems: [Y] = yes [  ]=no Constitional: recent weight change [ n ];  Wt loss over the last 3 months [   ] anorexia [  ]; fatigue [ y ]; nausea [n  ]; night sweats [  ]; fever [ n ]; or chills [n  ];          Dental: poor dentition[  ]; Last Dentist visit:   Eye : blurred vision [  n]; diplopia [ n  ]; vision changes [ n ];  Amaurosis fugax[  n]; Resp: cough [ n ];  wheezing[n  ];  hemoptysis[ n ]; shortness of breath[ y ]; paroxysmal nocturnal dyspnea[ n ]; dyspnea on exertion[ y ]; or orthopnea[  ];  GI:  gallstones[  ], vomiting[ n ];  dysphagia[  ]; melena[  ];  hematochezia [  ]; heartburn[  ];   Hx of  Colonoscopy[  ]; GU: kidney stones [  ]; hematuria[n  ];   dysuria [  ];  nocturia[  ];  history of     obstruction [  ]; urinary frequency [  ]             Skin: rash, swelling[  ];, hair loss[ y ];  peripheral edema[y  ];  or itching[  ]; Musculosketetal: myalgias[y  ];  joint swelling[y  ];  joint erythema[  ];  joint pain[y  ];  back pain[y  ];  Heme/Lymph: bruising[ y ];  bleeding[ n ];  anemia[ n ];  Neuro: TIA[n  ];  headaches[  ];  stroke[  ];  vertigo[  ];  seizures[ n ];   paresthesias[y  ];  difficulty walking[y  ];  Psych:depression[  ]; anxiety[  ];  Endocrine: diabetes[n  ];  thyroid dysfunction[n  ];  Immunizations: Flu up to date [ y ]; Pneumococcal up to date Blue.Reese  ];  Other:  Physical Exam: BP 135/89 mmHg  Pulse 110  Resp 20  Ht 6\' 1"  (1.854 m)  Wt 195 lb (88.451 kg)  BMI 25.73 kg/m2   SpO2 98%  PHYSICAL EXAMINATION: General appearance: alert, cooperative and appears older than stated age Head: Normocephalic, without obvious abnormality, atraumatic Neck: no adenopathy, no carotid bruit, no JVD, supple, symmetrical, trachea midline and thyroid not enlarged, symmetric, no tenderness/mass/nodules Lymph nodes: Cervical, supraclavicular, and axillary nodes normal. Resp: clear to auscultation bilaterally Back: symmetric, no curvature. ROM normal. No CVA tenderness. Cardio: regular rate and rhythm, S1, S2 normal, no murmur, click, rub or gallop GI: soft, non-tender; bowel sounds normal; no masses,  no organomegaly Extremities: Patient with chronic ischemia of his toes both feet with small ulcerations on the left, he  has no palpable pedal pulses in either foot Neurologic: Gait: Patient has trouble ambulating especially with right leg weakness  Diagnostic Studies & Laboratory data:     Recent Radiology Findings:  Nm Pet Image Initial (pi) Skull Base To Thigh  07/19/2014   CLINICAL DATA:  Initial treatment strategy for left lower lobe lung nodule.  EXAM: NUCLEAR MEDICINE PET SKULL BASE TO THIGH  TECHNIQUE: 9.8 mCi F-18 FDG was injected intravenously. Full-ring PET imaging was performed from the skull base to thigh after the radiotracer. CT data was obtained and used for attenuation correction and anatomic localization.  FASTING BLOOD GLUCOSE:  Value: 163 mg/dl  COMPARISON:  Screening chest CT on 07/11/2014  FINDINGS: NECK  No hypermetabolic lymph nodes in the neck.  CHEST  No hypermetabolic mediastinal or hilar nodes. Pleural-based nodular opacity in the posterior left lower lobe measures 1.5 x 2.6 cm, which is stable in size. This shows low-grade metabolic activity with SUV max of 2.4.  A 5 mm pleural-based nodule is all seen in the posterior right lower lobe on image 58/series 7 which is stable and shows no hypermetabolic activity although it is too small to characterize by PET.  4.5 cm  ascending thoracic aortic aneurysm again noted. Mild emphysema again demonstrated.  ABDOMEN/PELVIS  No abnormal hypermetabolic activity within the liver, pancreas, adrenal glands, or spleen. No hypermetabolic lymph nodes in the abdomen or pelvis.  Mildly enlarged prostate seen with mass effect on bladder base.  SKELETON  No focal hypermetabolic activity to suggest skeletal metastasis.  IMPRESSION: 1.5 x 2.6 cm pleural-based nodule in posterior left lower lobe shows low-grade metabolic activity, which is nonspecific. Differential considerations include low-grade malignancy, rounded atelectasis, and inflammatory or infectious process.  Nonspecific 5 mm pleural-based nodule in posterior right lower lobe shows no metabolic activity, but is too small to characterize by PET. Continued attention on follow-up CT recommended.  No evidence of thoracic nodal metastases or distant metastatic disease.  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 Recommend semi-annual imaging followup by CTA or MRA and referral to cardiothoracic surgery if not already obtained. This recommendation follows 2010 ACCF/AHA/AATS/ACR/ASA/SCA/SCAI/SIR/STS/SVM Guidelines for the Diagnosis and Management of Patients With Thoracic Aortic Disease. Circulation. 2010; 121: U314-H702.   Electronically Signed   By: Earle Gell M.D.   On: 07/19/2014 10:11   Ct Chest Lung Ca Screen Low Dose W/o Cm  07/11/2014   CLINICAL DATA:  74 year old male current smoker with 90 pack-year history of smoking. Lung cancer screening examination.  EXAM: CT CHEST WITHOUT CONTRAST  TECHNIQUE: Multidetector CT imaging of the chest was performed following the standard protocol without IV contrast.  COMPARISON:  No priors.  FINDINGS: Mediastinum/Lymph Nodes: Heart size is normal. There is no significant pericardial fluid, thickening or pericardial calcification. No pathologically enlarged mediastinal or hilar lymph nodes. There is atherosclerosis of the thoracic aorta,  the great vessels of the mediastinum and the coronary arteries, including calcified atherosclerotic plaque in the left main, left anterior descending, left circumflex and right coronary arteries. In addition, there is mild aneurysmal dilatation (4.5 cm) of the ascending thoracic aorta. Calcifications of the aortic valve. Please note that accurate exclusion of hilar adenopathy is limited on noncontrast CT scans. Esophagus is unremarkable in appearance. Saber sheath trachea. No axillary lymphadenopathy.  Lungs/Pleura: In the posterior aspect of the left lower lobe (image 181 of series 3) there is a pleural-based nodular opacity which has a volume derived mean diameter of approximately 23.8 mm. Tiny subpleural nodule also  noted in the periphery of the right lower lobe (image 280 of series 3) as well, with a mean diameter of 6.9 mm. No acute consolidative airspace disease. No pleural effusions. 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 Mild diffuse bronchial wall thickening with mild centrilobular and paraseptal emphysema.  Upper Abdomen: Atherosclerosis.  Otherwise, unremarkable.  Musculoskeletal/Soft Tissues: There are no aggressive appearing lytic or blastic lesions noted in the visualized portions of the skeleton.  IMPRESSION: 1. Lung-RADS Category 4BS, suspicious. Specifically, there is a large pleural based nodule in the posterior left lower lobe which has a volume derived mean diameter of approximately 23.8 mm. Given the apparent asbestos related pleural disease, this could simply represent an area of rounded atelectasis, however, the possibility of a malignant bronchogenic neoplasm, or early malignant mesothelioma is not excluded, and further evaluation with PET-CT is recommended at this time. 2. The "S" modifier above refers to potentially clinically significant non lung cancer related findings. Specifically, Atherosclerosis, including left main and 3  vessel coronary artery disease. Assessment for potential risk factor modification, dietary therapy or pharmacologic therapy may be warranted, if clinically indicated. 3. In addition, there is mild aneurysmal dilatation (4.5 cm in diameter) of the ascending thoracic aorta. Ascending thoracic aortic aneurysm. Recommend semi-annual imaging followup by CTA or MRA and referral to cardiothoracic surgery if not already obtained. This recommendation follows 2010 ACCF/AHA/AATS/ACR/ASA/SCA/SCAI/SIR/STS/SVM Guidelines for the Diagnosis and Management of Patients With Thoracic Aortic Disease. Circulation. 2010; 121: I627-O350. 4. Mild diffuse bronchial wall thickening with mild centrilobular and paraseptal emphysema; imaging findings suggestive of underlying COPD. These results were called by telephone at the time of interpretation on 07/11/2014 at 9:58 am to Dr. Claris Gower, who verbally acknowledged these results.   Electronically Signed   By: Vinnie Langton M.D.   On: 07/11/2014 10:05     I have independently reviewed the above radiology studies  and reviewed the findings with the patient.   Recent Lab Findings: Lab Results  Component Value Date   WBC 7.1 01/12/2014   HGB 14.1 01/12/2014   HCT 41.6 01/12/2014   PLT 225 01/12/2014   GLUCOSE 210* 01/12/2014   ALT <5 01/12/2014   AST 16 01/12/2014   NA 142 01/12/2014   K 4.7 01/12/2014   CL 103 01/12/2014   CREATININE 1.43* 01/12/2014   BUN 34* 01/12/2014   CO2 23 01/12/2014   INR 1.05 01/12/2014   HGBA1C 6.7* 02/03/2013   Aortic Size Index=     4.5    /Body surface area is 2.13 meters squared. = 2.11  < 2.75 cm/m2      4% risk per year 2.75 to 4.25          8% risk per year > 4.25 cm/m2    20% risk per year     Assessment / Plan:   1.5 x 2.6 cm pleural-based nodule in posterior left lower lobe- will planned CT-guided needle biopsy of the left lower lobe pleural-based mass, the characteristics of this on CT and PET scan do not lead to an  obvious diagnosis may be a primary lung cancer, possible mesothelioma or inflammatory mass. The patient is on high doses of aspirin and Plavix, he will hold this pending the needle biopsy.   Nonspecific 5 mm pleural-based nodule in posterior right lower lobe- the small nodule in the right lung less than 5 mm in size is too small to characterize and will follow with serial CT   Ascending  thoracic aortic aneurysm-mild aneurysmal dilatation (4.5 cm in diameter) of the ascending thoracic aorta-patient has no murmur of aortic insufficiency  or aortic stenosis but with the dilated ascending aorta we will obtain a echocardiogram to evaluate his aortic root, rule out bicuspid aortic valve. At the current size 4.5 cm we would not recommend elective surgical intervention , but will need serial CT scans to evaluate for change . The patient has no family history of sudden cardiac death or ascending aortic dissection.  Atherosclerosis, including left main and 3 vessel coronary artery disease - the patient denies any definite anginal symptoms but does have known left main and three-vessel coronary artery disease on a nonspecific CT scan based on calcium only. He reports that he had seen Dr. Wynonia Lawman, we will have him follow-up with Dr. Wynonia Lawman concerning his coronary artery disease and the potential need for lung resection and future.  Patient has symptomatic bilateral claudication right leg greater than left and on physical exam evidence of significant peripheral vascular disease- bilateral iliac artery stents were previously placed by Dr. Gwenlyn Found, the patient refused to go back and see him. We will obtain lower extremity arterial Dopplers.    I  spent 45 minutes counseling the patient face to face and 50% or more the  time was spent in counseling and coordination of care. The total time spent in the appointment was 60 minutes.  Grace Isaac MD      [address] [place] [address] Office  [phone]   Beeper [phone]  07/20/2014 10:47 AM

## 2014-07-20 NOTE — Telephone Encounter (Signed)
I called and spoke with Marc [name] about his PET scan results at the request of Tobin Chad, [place] for Dr. Arelia Sneddon. I explained that the PET scan showed that the left lower node nodule had a low grade increase in metabolic activity. I explained that that could  mean there was a low grade malignancy,some rounded atelectasis, or simply an inflammatory of infectious process.He verbalized understanding. I explained that the next step would be to have the nodule evaluated for biopsy by a thoracic surgeon, to get a diagnosis. He has an appointment today ( 07/20/14) with Dr. Servando Snare, which had been arranged by Dr. Arelia Sneddon , prior to the PET scan, as a follow up for an aneurysm which was also found on the screening CT. I explained to Marc [name] that I had messaged Dr. Servando Snare regarding the PET scan, and that he was aware of the PET scan results. He will evaluate the nodule and advise Marc [name] on the appropriate next steps for diagnosis and appropriate plan of care based on the results. Marc [name] verbalized understanding. He has my contact information should he have any further questions.

## 2014-07-20 NOTE — Telephone Encounter (Signed)
I called to speak with Dr. Arelia Sneddon about the PET scan results for this patient as I always give the PCP's the option to speak directly with their patient's regarding diagnostic results. Dr. Arelia Sneddon is out of the office today, and I spoke with Tobin Chad, PA. I explained that this patients PET scan revealed a posterior LLL nodule with a low grade metabolic activity with an SUV of 2.4, with a possible differential of low grade malignancy, rounded atelectasis, inflammatory or infectious process. I explained that this patient is already scheduled to see Dr. Servando Snare today( 07/20/14), as Dr Arelia Sneddon had made the appointment prior to the PET scan being completed for follow up of an ascending  thoracic aortic aneurysm. This is the appropriate referral for further work up of the nodule. Marc [name] asked that I contact the patient with the results. I told him I would be happy to call the patient with results of the scan.

## 2014-07-21 ENCOUNTER — Telehealth: Payer: Self-pay

## 2014-07-21 ENCOUNTER — Telehealth (HOSPITAL_COMMUNITY): Payer: Self-pay

## 2014-07-21 NOTE — Telephone Encounter (Signed)
pateint confirmed that he had stopped his Plavix and ASA

## 2014-07-23 ENCOUNTER — Other Ambulatory Visit: Payer: Self-pay | Admitting: Radiology

## 2014-07-25 ENCOUNTER — Ambulatory Visit (HOSPITAL_BASED_OUTPATIENT_CLINIC_OR_DEPARTMENT_OTHER)
Admission: RE | Admit: 2014-07-25 | Discharge: 2014-07-25 | Disposition: A | Discharge status: Home / Self Care | Payer: Medicare Other | Source: Ambulatory Visit | Attending: Cardiothoracic Surgery | Admitting: Cardiothoracic Surgery

## 2014-07-25 ENCOUNTER — Other Ambulatory Visit: Payer: Self-pay | Admitting: Cardiothoracic Surgery

## 2014-07-25 ENCOUNTER — Ambulatory Visit (HOSPITAL_COMMUNITY)
Admission: RE | Admit: 2014-07-25 | Discharge: 2014-07-25 | Disposition: A | Discharge status: Home / Self Care | Payer: Medicare Other | Source: Ambulatory Visit | Attending: Cardiothoracic Surgery | Admitting: Cardiothoracic Surgery

## 2014-07-25 ENCOUNTER — Other Ambulatory Visit: Payer: Self-pay | Admitting: Physician Assistant

## 2014-07-25 DIAGNOSIS — I739 Peripheral vascular disease, unspecified: Secondary | ICD-10-CM

## 2014-07-25 DIAGNOSIS — M79662 Pain in left lower leg: Secondary | ICD-10-CM

## 2014-07-25 DIAGNOSIS — I70201 Unspecified atherosclerosis of native arteries of extremities, right leg: Secondary | ICD-10-CM | POA: Insufficient documentation

## 2014-07-25 DIAGNOSIS — M79661 Pain in right lower leg: Secondary | ICD-10-CM

## 2014-07-25 LAB — PULMONARY FUNCTION TEST
DL/VA % pred: 87 %
DL/VA: 3.9 ml/min/mmHg/L
DLCO unc % pred: 69 %
DLCO unc: 20.54 ml/min/mmHg
FEF 25-75 Post: 0.85 L/sec
FEF 25-75 Pre: 1.76 L/sec
FEF2575-%Change-Post: -51 %
FEF2575-%Pred-Post: 41 %
FEF2575-%Pred-Pre: 85 %
FEV1-%Change-Post: -18 %
FEV1-%Pred-Post: 66 %
FEV1-%Pred-Pre: 81 %
FEV1-Post: 1.87 L
FEV1-Pre: 2.29 L
FEV1FVC-%Change-Post: -7 %
FEV1FVC-%Pred-Pre: 96 %
FEV6-%Change-Post: -10 %
FEV6-%Pred-Post: 77 %
FEV6-%Pred-Pre: 87 %
FEV6-Post: 2.84 L
FEV6-Pre: 3.18 L
FEV6FVC-%Change-Post: 1 %
FEV6FVC-%Pred-Post: 104 %
FEV6FVC-%Pred-Pre: 103 %
FVC-%Change-Post: -11 %
FVC-%Pred-Post: 73 %
FVC-%Pred-Pre: 83 %
FVC-Post: 2.88 L
FVC-Pre: 3.26 L
Post FEV1/FVC ratio: 65 %
Post FEV6/FVC ratio: 99 %
Pre FEV1/FVC ratio: 70 %
Pre FEV6/FVC Ratio: 98 %
RV % pred: 138 %
RV: 3.38 L
TLC % pred: 101 %
TLC: 6.74 L

## 2014-07-25 MED ORDER — ALBUTEROL SULFATE (2.5 MG/3ML) 0.083% IN NEBU
2.5000 mg | INHALATION_SOLUTION | Freq: Once | RESPIRATORY_TRACT | Status: AC
  Administered 2014-07-25: 2.5 mg via RESPIRATORY_TRACT

## 2014-07-25 NOTE — Progress Notes (Signed)
*  PRELIMINARY RESULTS* Vascular Ultrasound Lower Extremity Arterial Duplex has been completed.  Preliminary findings:  Right = 50-99% stenosis right mid SFA. Occlusion of the right distal SFA ( known occlusion). Recanalized flow to the right popliteal artery. Occlusion of the right ATA (known occlusion). Near occlusion of the right PTA. Monophasic flow in the right peroneal artery.   ABI completed:   RIGHT    LEFT    PRESSURE WAVEFORM  PRESSURE WAVEFORM  BRACHIAL 177 Tri BRACHIAL 170 Tri  DP   DP 112 Mono   AT   AT 119 Mono   PT 63 Severe damp mono PT    PER   PER    GREAT TOE  NA GREAT TOE  NA    RIGHT LEFT  ABI 0.36 0.67       Landry Mellow, RDMS, RVT  07/25/2014, 12:09 PM

## 2014-07-26 ENCOUNTER — Encounter (HOSPITAL_COMMUNITY): Payer: Self-pay

## 2014-07-26 ENCOUNTER — Ambulatory Visit (HOSPITAL_COMMUNITY)
Admission: RE | Admit: 2014-07-26 | Discharge: 2014-07-26 | Disposition: A | Discharge status: Home / Self Care | Payer: Medicare Other | Source: Ambulatory Visit | Attending: Interventional Radiology | Admitting: Interventional Radiology

## 2014-07-26 ENCOUNTER — Telehealth: Payer: Self-pay

## 2014-07-26 ENCOUNTER — Ambulatory Visit (HOSPITAL_COMMUNITY)
Admission: RE | Admit: 2014-07-26 | Discharge: 2014-07-26 | Disposition: A | Discharge status: Home / Self Care | Payer: Medicare Other | Source: Ambulatory Visit | Attending: Cardiothoracic Surgery | Admitting: Cardiothoracic Surgery

## 2014-07-26 DIAGNOSIS — R911 Solitary pulmonary nodule: Secondary | ICD-10-CM | POA: Insufficient documentation

## 2014-07-26 DIAGNOSIS — Z9889 Other specified postprocedural states: Secondary | ICD-10-CM

## 2014-07-26 DIAGNOSIS — R918 Other nonspecific abnormal finding of lung field: Secondary | ICD-10-CM | POA: Insufficient documentation

## 2014-07-26 LAB — PROTIME-INR
INR: 1.03 (ref 0.00–1.49)
PROTHROMBIN TIME: 13.7 s (ref 11.6–15.2)

## 2014-07-26 LAB — CBC
HCT: 41.6 % (ref 39.0–52.0)
Hemoglobin: 14.1 g/dL (ref 13.0–17.0)
MCH: 31.8 pg (ref 26.0–34.0)
MCHC: 33.9 g/dL (ref 30.0–36.0)
MCV: 93.9 fL (ref 78.0–100.0)
PLATELETS: 194 10*3/uL (ref 150–400)
RBC: 4.43 MIL/uL (ref 4.22–5.81)
RDW: 14 % (ref 11.5–15.5)
WBC: 7.1 10*3/uL (ref 4.0–10.5)

## 2014-07-26 LAB — GLUCOSE, CAPILLARY
GLUCOSE-CAPILLARY: 106 mg/dL — AB (ref 65–99)
Glucose-Capillary: 156 mg/dL — ABNORMAL HIGH (ref 65–99)

## 2014-07-26 LAB — APTT: aPTT: 35 seconds (ref 24–37)

## 2014-07-26 MED ORDER — FENTANYL CITRATE (PF) 100 MCG/2ML IJ SOLN
INTRAMUSCULAR | Status: AC
  Filled 2014-07-26: qty 2

## 2014-07-26 MED ORDER — FENTANYL CITRATE (PF) 100 MCG/2ML IJ SOLN
INTRAMUSCULAR | Status: AC | PRN
  Administered 2014-07-26: 25 ug via INTRAVENOUS

## 2014-07-26 MED ORDER — MIDAZOLAM HCL 2 MG/2ML IJ SOLN
INTRAMUSCULAR | Status: AC
  Filled 2014-07-26: qty 2

## 2014-07-26 MED ORDER — MIDAZOLAM HCL 2 MG/2ML IJ SOLN
INTRAMUSCULAR | Status: AC | PRN
  Administered 2014-07-26: 0.5 mg via INTRAVENOUS

## 2014-07-26 MED ORDER — LIDOCAINE-EPINEPHRINE 1 %-1:100000 IJ SOLN
INTRAMUSCULAR | Status: AC
  Filled 2014-07-26: qty 1

## 2014-07-26 MED ORDER — SODIUM CHLORIDE 0.9 % IV SOLN: Freq: Once | INTRAVENOUS | Status: DC

## 2014-07-26 NOTE — Discharge Instructions (Signed)
Needle Biopsy of Lung, Care After °Refer to this sheet in the next few weeks. These instructions provide you with information on caring for yourself after your procedure. Your health care provider may also give you more specific instructions. Your treatment has been planned according to current medical practices, but problems sometimes occur. Call your health care provider if you have any problems or questions after your procedure. °WHAT TO EXPECT AFTER THE PROCEDURE °· A bandage will be applied over the area where the needle was inserted. You may be asked to apply pressure to the bandage for several minutes to ensure there is minimal bleeding. °· In most cases, you can leave when your needle biopsy procedure is completed. Do not drive yourself home. Someone else should take you home. °· If you received an IV sedative or general anesthetic, you will be taken to a comfortable place to relax while the medicine wears off. °· If you have upcoming travel scheduled, talk to your health care provider about when it is safe to travel by air after the procedure. °HOME CARE INSTRUCTIONS °· Expect to take it easy for the rest of the day. °· Protect the area where you received the needle biopsy by keeping the bandage in place for as long as instructed. °· You may feel some mild pain or discomfort in the area, but this should stop in a day or two. °· Take medicines only as directed by your health care provider. °SEEK MEDICAL CARE IF:  °· You have pain at the biopsy site that worsens or is not helped by medicine. °· You have swelling or drainage at the needle biopsy site. °· You have a fever. °SEEK IMMEDIATE MEDICAL CARE IF:  °· You have new or worsening shortness of breath. °· You have chest pain. °· You are coughing up blood. °· You have bleeding that does not stop with pressure or a bandage. °· You develop light-headedness or fainting. °Document Released: 11/11/2006 Document Revised: 05/31/2013 Document Reviewed:  06/08/2012 °Exit[employer]® Patient Information ©2015 [employer]. This information is not intended to replace advice given to you by your health care provider. Make sure you discuss any questions you have with your health care provider. ° °

## 2014-07-26 NOTE — H&P (Signed)
Chief Complaint: Left Lower Lobe Mass  Referring Physician(s): Gerhardt,Edward B  History of Present Illness: Marc [name] is a 74 y.o. male who is here today for CT guided biopsy of his left lower lobe lung mass.  He has a long history of smoking since age 38 up to 2 packs a day for more than 30 years.   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   He has known peripheral vascular disease and had stents placed in right and left iliac arteries by Dr. Gwenlyn Found. He does take Plavix and ASA for this but has held it since last Thursday.  He denies any SOB but he is limited in his physical ability because of chronic back next in knee pain, status post back surgery 3 in right knee replacement.  CT scan done on 6/13 revealed a 2.4 cm pleural based nodule. Then PET scan revealed 1.5 x 2.6 cm pleural-based nodule in posterior left lower lobe shows low-grade metabolic activity, which is nonspecific. Differential considerations include low-grade malignancy, rounded atelectasis, and inflammatory or infectious process.  Past Medical History  Diagnosis Date  . Hypertension   . Ulcer disease   . Colon polyp   . Gout   . Bradycardia   . Hypercholesteremia   . PAD (peripheral artery disease)   . Chronic bronchitis   . Type II diabetes mellitus   . History of GI bleed 2007  . Gout   . Tobacco abuse disorder 03/10/2012  . GERD (gastroesophageal reflux disease)   . Pneumonia     denies    Past Surgical History  Procedure Laterality Date  . Splenectomy  ~ 1957  . Anterior cervical decomp/discectomy fusion  ~ 2007  . Upper endoscopy w/ sclerotherapy  ~ 2007  . Polypectomy    . Colonoscopy    . Iliac artery stent      left common/notes (02/04/2012)  . Hernia repair  ~ 2007    UHR (02/04/2012)  . Tonsillectomy  ~ 1947  . Lumbar  laminectomy  11/14  . Total knee arthroplasty Right 02/03/2013    Procedure: TOTAL KNEE ARTHROPLASTY;  Surgeon: [name], MD;  Location: [facility];  Service: Orthopedics;  Laterality: Right;  . Lexiscan stress test  01/24/12  . Lower extremity angiogram N/A 02/04/2012    Procedure: LOWER EXTREMITY ANGIOGRAM;  Surgeon: Lorretta Harp, MD;  Location: [facility] CATH LAB;  Service: Cardiovascular;  Laterality: N/A;  . Percutaneous stent intervention Left 02/04/2012    Procedure: PERCUTANEOUS STENT INTERVENTION;  Surgeon: Lorretta Harp, MD;  Location: [facility] CATH LAB;  Service: Cardiovascular;  Laterality: Left;  lt ext iliac stent  . Replacement total knee Right   . Back surgery    . Anterior fusion lumbar spine  01/20/2014  . Anterior lat lumbar fusion Left 01/19/2014    Procedure: LATERAL INTERBODY FUSION 1 LEVEL;  Surgeon: Sinclair Ship, MD;  Location: [place];  Service: Orthopedics;  Laterality: Left;  Left lumbar 3-4 lateral interbody fusion with instrumentation, allograft    Allergies: Review of patient's allergies indicates no known allergies.  Medications: Prior to Admission medications   Medication Sig Start Date End Date Taking? Authorizing Provider  allopurinol (ZYLOPRIM) 300 MG tablet Take 300 mg by mouth daily.    Historical Provider, MD  clopidogrel (PLAVIX) 75 MG tablet Take 1 tablet by mouth  daily. 11/22/13   Historical Provider, MD  furosemide (LASIX) 80 MG tablet Take 40 mg by mouth daily.  10/20/13   Historical Provider, MD  gabapentin (NEURONTIN) 600 MG tablet Take 1,200 mg by mouth every 8 (eight) hours.     Historical Provider, MD  glimepiride (AMARYL) 4 MG tablet Take 4 mg by mouth 2 (two) times daily.     Historical Provider, MD  lisinopril (PRINIVIL,ZESTRIL) 20 MG tablet Take 20 mg by mouth daily.      Historical Provider, MD  lovastatin (MEVACOR) 40 MG tablet Take 40 mg by mouth at bedtime.    Historical Provider, MD  omeprazole (PRILOSEC OTC) 20 MG tablet Take 40 mg by  mouth daily.     Historical Provider, MD  venlafaxine XR (EFFEXOR-XR) 37.5 MG 24 hr capsule Take 1 capsule (37.5 mg total) by mouth daily with breakfast. 11/24/13   Melvenia Beam, MD     Family History  Problem Relation Age of Onset  . Colon cancer Paternal Uncle     Uncle  . Heart disease Father   . Heart disease Mother     History   Social History  . Marital Status: Legally Separated    Spouse Name: N/A  . Number of Children: 2  . Years of Education: N/A   Occupational History  . retired   . cattle farm     owner   Social History Main Topics  . Smoking status: Current Every Day Smoker -- 2.00 packs/day for 58 years    Types: Cigarettes  . Smokeless tobacco: Former Systems developer    Types: Chew  . Alcohol Use: Yes     Comment:  quit for 22 yr; egg nog christmas  . Drug Use: No  . Sexual Activity: No   Other Topics Concern  . None   Social History Narrative   Patient lives at home with his spouse.   Caffeine Use: 2 cups daily     Review of Systems  Constitutional: Negative for fever, activity change, appetite change and fatigue.  Respiratory: Negative for cough, chest tightness and shortness of breath.   Cardiovascular: Negative for chest pain.  Gastrointestinal: Negative for nausea, vomiting and abdominal pain.  Musculoskeletal: Positive for back pain.  Skin: Negative.   Neurological: Negative.   Psychiatric/Behavioral: Negative.     Vital Signs: BP 159/73 mmHg  Pulse 72  Temp(Src) 98 F (36.7 C)  Resp 18  Ht 6\' 1"  (1.854 m)  Wt 189 lb (85.73 kg)  BMI 24.94 kg/m2  SpO2 99%  Physical Exam  Constitutional: He is oriented to person, place, and time. He appears well-developed and well-nourished.  HENT:  Head: Normocephalic and atraumatic.  Eyes: EOM are normal.  Neck: Normal range of motion. Neck supple.  No audible carotid bruits  Cardiovascular: Normal rate, regular rhythm and normal heart sounds.   No murmur heard. Pulmonary/Chest: Effort normal and  breath sounds normal. He has no wheezes.  Abdominal: Soft. Bowel sounds are normal. There is no tenderness.  Musculoskeletal: Normal range of motion.  Neurological: He is alert and oriented to person, place, and time.  Skin: Skin is warm and dry.  Psychiatric: He has a normal mood and affect. His behavior is normal. Judgment and thought content normal.  Vitals reviewed.   Mallampati Score:  MD Evaluation Airway: WNL Heart: WNL Abdomen: WNL Chest/ Lungs: WNL ASA  Classification: 3 Mallampati/Airway Score: Two  Imaging: Nm Pet Image Initial (pi) Skull Base To Thigh  07/19/2014  CLINICAL DATA:  Initial treatment strategy for left lower lobe lung nodule.  EXAM: NUCLEAR MEDICINE PET SKULL BASE TO THIGH  TECHNIQUE: 9.8 mCi F-18 FDG was injected intravenously. Full-ring PET imaging was performed from the skull base to thigh after the radiotracer. CT data was obtained and used for attenuation correction and anatomic localization.  FASTING BLOOD GLUCOSE:  Value: 163 mg/dl  COMPARISON:  Screening chest CT on 07/11/2014  FINDINGS: NECK  No hypermetabolic lymph nodes in the neck.  CHEST  No hypermetabolic mediastinal or hilar nodes. Pleural-based nodular opacity in the posterior left lower lobe measures 1.5 x 2.6 cm, which is stable in size. This shows low-grade metabolic activity with SUV max of 2.4.  A 5 mm pleural-based nodule is all seen in the posterior right lower lobe on image 58/series 7 which is stable and shows no hypermetabolic activity although it is too small to characterize by PET.  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 Mild emphysema again demonstrated.  ABDOMEN/PELVIS  No abnormal hypermetabolic activity within the liver, pancreas, adrenal glands, or spleen. No hypermetabolic lymph nodes in the abdomen or pelvis.  Mildly enlarged prostate seen with mass effect on bladder base.  SKELETON  No focal hypermetabolic activity to suggest skeletal metastasis.  IMPRESSION: 1.5 x 2.6  cm pleural-based nodule in posterior left lower lobe shows low-grade metabolic activity, which is nonspecific. Differential considerations include low-grade malignancy, rounded atelectasis, and inflammatory or infectious process.  Nonspecific 5 mm pleural-based nodule in posterior right lower lobe shows no metabolic activity, but is too small to characterize by PET. Continued attention on follow-up CT recommended.  No evidence of thoracic nodal metastases or distant metastatic disease.  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 Recommend semi-annual imaging followup by CTA or MRA and referral to cardiothoracic surgery if not already obtained. This recommendation follows 2010 ACCF/AHA/AATS/ACR/ASA/SCA/SCAI/SIR/STS/SVM Guidelines for the Diagnosis and Management of Patients With Thoracic Aortic Disease. Circulation. 2010; 121: K240-X735.   Electronically Signed   By: Earle Gell M.D.   On: 07/19/2014 10:11   Ct Chest Lung Ca Screen Low Dose W/o Cm  07/11/2014   CLINICAL DATA:  74 year old male current smoker with 90 pack-year history of smoking. Lung cancer screening examination.  EXAM: CT CHEST WITHOUT CONTRAST  TECHNIQUE: Multidetector CT imaging of the chest was performed following the standard protocol without IV contrast.  COMPARISON:  No priors.  FINDINGS: Mediastinum/Lymph Nodes: Heart size is normal. There is no significant pericardial fluid, thickening or pericardial calcification. No pathologically enlarged mediastinal or hilar lymph nodes. There is atherosclerosis of the thoracic aorta, the great vessels of the mediastinum and the coronary arteries, including calcified atherosclerotic plaque in the left main, left anterior descending, left circumflex and right coronary arteries. In addition, there is mild aneurysmal dilatation (4.5 cm) of the ascending thoracic aorta. Calcifications of the aortic valve. Please note that accurate exclusion of hilar adenopathy is limited on noncontrast CT  scans. Esophagus is unremarkable in appearance. Saber sheath trachea. No axillary lymphadenopathy.  Lungs/Pleura: In the posterior aspect of the left lower lobe (image 181 of series 3) there is a pleural-based nodular opacity which has a volume derived mean diameter of approximately 23.8 mm. Tiny subpleural nodule also noted in the periphery of the right lower lobe (image 280 of series 3) as well, with a mean diameter of 6.9 mm. No acute consolidative airspace disease. No pleural effusions. 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 Mild diffuse bronchial  wall thickening with mild centrilobular and paraseptal emphysema.  Upper Abdomen: Atherosclerosis.  Otherwise, unremarkable.  Musculoskeletal/Soft Tissues: There are no aggressive appearing lytic or blastic lesions noted in the visualized portions of the skeleton.  IMPRESSION: 1. Lung-RADS Category 4BS, suspicious. Specifically, there is a large pleural based nodule in the posterior left lower lobe which has a volume derived mean diameter of approximately 23.8 mm. Given the apparent asbestos related pleural disease, this could simply represent an area of rounded atelectasis, however, the possibility of a malignant bronchogenic neoplasm, or early malignant mesothelioma is not excluded, and further evaluation with PET-CT is recommended at this time. 2. The "S" modifier above refers to potentially clinically significant non lung cancer related findings. Specifically, Atherosclerosis, including left main and 3 vessel coronary artery disease. Assessment for potential risk factor modification, dietary therapy or pharmacologic therapy may be warranted, if clinically indicated. 3. In addition, there is mild aneurysmal dilatation (4.5 cm in diameter) of the ascending thoracic aorta. Ascending thoracic aortic aneurysm. Recommend semi-annual imaging followup by CTA or MRA and referral to cardiothoracic surgery if not  already obtained. This recommendation follows 2010 ACCF/AHA/AATS/ACR/ASA/SCA/SCAI/SIR/STS/SVM Guidelines for the Diagnosis and Management of Patients With Thoracic Aortic Disease. Circulation. 2010; 121: H846-N629. 4. Mild diffuse bronchial wall thickening with mild centrilobular and paraseptal emphysema; imaging findings suggestive of underlying COPD. These results were called by telephone at the time of interpretation on 07/11/2014 at 9:58 am to Dr. Claris Gower, who verbally acknowledged these results.   Electronically Signed   By: Vinnie Langton M.D.   On: 07/11/2014 10:05    Labs:  CBC:  Recent Labs  09/06/13 0843 09/10/13 0503 01/12/14 1005  WBC 6.9 6.7 7.1  HGB 14.1 13.1 14.1  HCT 43.0 40.0 41.6  PLT 201 183 225    COAGS:  Recent Labs  09/08/13 1501 01/12/14 1005  INR 1.02 1.05  APTT 36 34    BMP:  Recent Labs  09/06/13 0843 09/10/13 0503 01/12/14 1005  NA 145 142 142  K 4.8 4.8 4.7  CL 105 105 103  CO2 28 25 23   GLUCOSE 206* 116* 210*  BUN 23 27* 34*  CALCIUM 9.4 9.1 9.8  CREATININE 1.34 1.26 1.43*  GFRNONAA 51* 55* 47*  GFRAA 59* 64* 54*    LIVER FUNCTION TESTS:  Recent Labs  01/12/14 1005  BILITOT 0.4  AST 16  ALT <5  ALKPHOS 116  PROT 7.5  ALBUMIN 4.0    TUMOR MARKERS: No results for input(s): AFPTM, CEA, CA199, CHROMGRNA in the last 8760 hours.  Assessment and Plan:  Left lower lobe pleural-based nodule in posterior left lower lobe which could be low-grade malignancy, rounded atelectasis, inflammatory or infectious process.  Will proceed with CT guided biopsy of the Left Lower Lobe mass by Dr. Pascal Lux today  Risks and Benefits discussed with the patient including, but not limited to bleeding, hemoptysis, respiratory failure requiring intubation, infection, pneumothorax requiring chest tube placement, stroke from air embolism or even death. All of the patient's questions were answered, patient is agreeable to proceed. Consent signed  and in chart.  Thank you for this interesting consult.  I greatly enjoyed meeting Marc [name] and look forward to participating in their care.  Signed: Murrell Redden PA-C 07/26/2014, 8:25 AM   I spent a total of  20 Minutes  in face to face in clinical consultation, greater than 50% of which was counseling/coordinating care for CT guided biopsy of the left lower lobe nodule

## 2014-07-26 NOTE — Telephone Encounter (Signed)
Referral sent to [facility] via [facility] for stenosis and occlusion consultation.

## 2014-07-26 NOTE — Procedures (Signed)
Technically successful CT guided biopsy of hypermetabolic nodule in left lower lobe.  No immediate post procedural complications.

## 2014-08-03 ENCOUNTER — Encounter: Payer: Self-pay | Admitting: Cardiovascular Disease

## 2014-08-03 ENCOUNTER — Ambulatory Visit (INDEPENDENT_AMBULATORY_CARE_PROVIDER_SITE_OTHER): Payer: Medicare Other | Admitting: Cardiothoracic Surgery

## 2014-08-03 ENCOUNTER — Encounter: Payer: Self-pay | Admitting: Cardiothoracic Surgery

## 2014-08-03 VITALS — BP 140/80 | HR 84 | Resp 20 | Ht 73.0 in | Wt 189.0 lb

## 2014-08-03 DIAGNOSIS — I739 Peripheral vascular disease, unspecified: Secondary | ICD-10-CM | POA: Diagnosis not present

## 2014-08-03 DIAGNOSIS — I712 Thoracic aortic aneurysm, without rupture, unspecified: Secondary | ICD-10-CM

## 2014-08-03 DIAGNOSIS — L97909 Non-pressure chronic ulcer of unspecified part of unspecified lower leg with unspecified severity: Secondary | ICD-10-CM

## 2014-08-03 DIAGNOSIS — R911 Solitary pulmonary nodule: Secondary | ICD-10-CM

## 2014-08-03 DIAGNOSIS — R918 Other nonspecific abnormal finding of lung field: Secondary | ICD-10-CM

## 2014-08-03 DIAGNOSIS — Z9889 Other specified postprocedural states: Secondary | ICD-10-CM | POA: Diagnosis not present

## 2014-08-03 DIAGNOSIS — J984 Other disorders of lung: Secondary | ICD-10-CM | POA: Diagnosis not present

## 2014-08-03 NOTE — Progress Notes (Signed)
[address]       [place] [address]             [place] Record #[id number] Date of Birth: Jan 28, 1941  Referring: [name], * Primary Care: [name], MD  Chief Complaint:    Chief Complaint  Patient presents with  . Lung Lesion    s/p CT Biopsy 07/26/14, discuss results    History of Present Illness:    Marc [name] 74 y.o. male is seen in the office  today for abnormal screening CT scan of the chest. The patient has a long history of smoking since age 36 up to 2 packs a day for more than 30 years. He has known peripheral vascular disease, history of abnormal EKG but no known acute myocardial infarction. He is limited in his physical ability because of chronic back next in knee pain, status post back surgery 3 in right knee replacement. He has known peripheral vascular disease having stents placed in right and left iliac arteries by Dr. Gwenlyn Found. He complains of chronic leg pain bilateral and numbness in his right foot.  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   Because of the patient's smoking history Dr. Arelia Sneddon recommended a screening CT scan of the chest and the patient's referred to thoracic surgery.   NEEDLE BIOPSY  OF LEFT LUNG LESION HAS BEEN DONEHAS BEEN DONE _  Diagnosis Lung, needle/core biopsy(ies), left lower lobe - BENIGN LUNG PARENCHYMA WITH MILD INFLAMMATION AND INTERSTITIAL FIBROSIS. - THERE IS NO EVIDENCE OF GRANULOMATA OR MALIGNANCY. Enid Cutter MD   Current Activity/ Functional Status:  Patient is independent with mobility/ambulation, transfers, ADL's, IADL's. patient does have difficulty with ambulation frequently uses a cane when out in public because of leg weakness  right greater than left. He notes that he is not as active as he has been over the years, stays at home and watches TV a lot   Zubrod Score: At the time of surgery this patient's most appropriate activity status/level should be described as: []     0    Normal activity, no symptoms [x]     1    Restricted in physical strenuous activity but ambulatory, able to do out light work []     2    Ambulatory and capable of self care, unable to do work activities, up and about               >50 % of waking hours                              []     3    Only limited self care, in bed greater than 50% of waking hours []     4    Completely disabled, no self care, confined to bed or chair []     5    Moribund   Past Medical History  Diagnosis Date  . Hypertension   . Ulcer disease   . Colon polyp   . Gout   . Bradycardia   . Hypercholesteremia   . PAD (peripheral artery disease)   . Chronic bronchitis   .  Type II diabetes mellitus   . History of GI bleed 2007  . Gout   . Tobacco abuse disorder 03/10/2012  . GERD (gastroesophageal reflux disease)   . Pneumonia     denies    Past Surgical History  Procedure Laterality Date  . Splenectomy  ~ 1957  . Anterior cervical decomp/discectomy fusion  ~ 2007  . Upper endoscopy w/ sclerotherapy  ~ 2007  . Polypectomy    . Colonoscopy    . Iliac artery stent      left common/notes (02/04/2012)  . Hernia repair  ~ 2007    [facility] (02/04/2012)  . Tonsillectomy  ~ 1947  . Lumbar laminectomy  11/14  . Total knee arthroplasty Right 02/03/2013    Procedure: TOTAL KNEE ARTHROPLASTY;  Surgeon: [name], MD;  Location: [facility];  Service: Orthopedics;  Laterality: Right;  . Lexiscan stress test  01/24/12  . Lower extremity angiogram N/A 02/04/2012    Procedure: LOWER EXTREMITY ANGIOGRAM;  Surgeon: Lorretta Harp, MD;  Location: [facility] CATH LAB;  Service: Cardiovascular;  Laterality: N/A;  . Percutaneous stent intervention Left 02/04/2012    Procedure: PERCUTANEOUS STENT  INTERVENTION;  Surgeon: Lorretta Harp, MD;  Location: [facility] CATH LAB;  Service: Cardiovascular;  Laterality: Left;  lt ext iliac stent  . Replacement total knee Right   . Back surgery    . Anterior fusion lumbar spine  01/20/2014  . Anterior lat lumbar fusion Left 01/19/2014    Procedure: LATERAL INTERBODY FUSION 1 LEVEL;  Surgeon: Sinclair Ship, MD;  Location: [facility];  Service: Orthopedics;  Laterality: Left;  Left lumbar 3-4 lateral interbody fusion with instrumentation, allograft    Family History  Problem Relation Age of Onset  . Colon cancer Paternal Uncle     Uncle  . Heart disease Father   . Heart disease Mother     History   Social History  . Marital Status: Legally Separated    Spouse Name: N/A  . Number of Children: 2  . Years of Education: N/A   Occupational History  . retired   . cattle farm     owner   Social History Main Topics  . Smoking status: Current Every Day Smoker -- 2.00 packs/day for 58 years    Types: Cigarettes  . Smokeless tobacco: Former Systems developer    Types: Chew  . Alcohol Use: Yes     Comment:  quit for 22 yr; egg nog christmas  . Drug Use: No  . Sexual Activity: No   Other Topics Concern  . Not on file   Social History Narrative   Patient lives at home with his spouse.   Caffeine Use: 2 cups daily    History  Smoking status  . Current Every Day Smoker -- 2.00 packs/day for 58 years  . Types: Cigarettes  Smokeless tobacco  . Former Systems developer  . Types: Chew    History  Alcohol Use  . Yes    Comment:  quit for 22 yr; egg nog christmas     No Known Allergies  Current Outpatient Prescriptions  Medication Sig Dispense Refill  . allopurinol (ZYLOPRIM) 300 MG tablet Take 300 mg by mouth daily.    . clopidogrel (PLAVIX) 75 MG tablet Take 1 tablet by mouth daily.    . furosemide (LASIX) 80 MG tablet Take 40 mg by mouth daily.     [name] gabapentin (NEURONTIN) 600 MG tablet Take 1,200 mg by mouth every 8 (eight) hours.     [name]  glimepiride  (AMARYL) 4 MG tablet Take 4 mg by mouth 2 (two) times daily.     [name] lisinopril (PRINIVIL,ZESTRIL) 20 MG tablet Take 20 mg by mouth daily.      [name] lovastatin (MEVACOR) 40 MG tablet Take 40 mg by mouth at bedtime.    [name] omeprazole (PRILOSEC OTC) 20 MG tablet Take 40 mg by mouth daily.      No current facility-administered medications for this visit.     Review of Systems:     Cardiac Review of Systems: Y or N  Chest Pain [ n   ]  Resting SOB [  n ] Exertional SOB  Blue.Reese  ]  Orthopnea [ n ]   Pedal Edema [ y  ]    Palpitations [ n ] Syncope  [ n ]   Presyncope [  n ]  General Review of Systems: [Y] = yes [  ]=no Constitional: recent weight change [ n ];  Wt loss over the last 3 months [   ] anorexia [  ]; fatigue [ y ]; nausea [n  ]; night sweats [  ]; fever [ n ]; or chills [n  ];          Dental: poor dentition[  ]; Last Dentist visit:   Eye : blurred vision [  n]; diplopia [ n  ]; vision changes [ n ];  Amaurosis fugax[  n]; Resp: cough [ n ];  wheezing[n  ];  hemoptysis[ n ]; shortness of breath[ y ]; paroxysmal nocturnal dyspnea[ n ]; dyspnea on exertion[ y ]; or orthopnea[  ];  GI:  gallstones[  ], vomiting[ n ];  dysphagia[  ]; melena[  ];  hematochezia [  ]; heartburn[  ];   Hx of  Colonoscopy[  ]; GU: kidney stones [  ]; hematuria[n  ];   dysuria [  ];  nocturia[  ];  history of     obstruction [  ]; urinary frequency [  ]             Skin: rash, swelling[  ];, hair loss[ y ];  peripheral edema[y  ];  or itching[  ]; Musculosketetal: myalgias[y  ];  joint swelling[y  ];  joint erythema[  ];  joint pain[y  ];  back pain[y  ];  Heme/Lymph: bruising[ y ];  bleeding[ n ];  anemia[ n ];  Neuro: TIA[n  ];  headaches[  ];  stroke[  ];  vertigo[  ];  seizures[ n ];   paresthesias[y  ];  difficulty walking[y  ];  Psych:depression[  ]; anxiety[  ];  Endocrine: diabetes[n  ];  thyroid dysfunction[n  ];  Immunizations: Flu up to date [ y ]; Pneumococcal up to date Blue.Reese  ];  Other:  Physical Exam: BP  140/80 mmHg  Pulse 84  Resp 20  Ht 6\' 1"  (1.854 m)  Wt 189 lb (85.73 kg)  BMI 24.94 kg/m2  SpO2 97%  PHYSICAL EXAMINATION: General appearance: alert, cooperative and appears older than stated age Head: Normocephalic, without obvious abnormality, atraumatic Neck: no adenopathy, no carotid bruit, no JVD, supple, symmetrical, trachea midline and thyroid not enlarged, symmetric, no tenderness/mass/nodules Lymph nodes: Cervical, supraclavicular, and axillary nodes normal. Resp: clear to auscultation bilaterally Back: symmetric, no curvature. ROM normal. No CVA tenderness. Cardio: regular rate and rhythm, S1, S2 normal, no murmur, click, rub or gallop GI: soft, non-tender; bowel sounds normal; no masses,  no organomegaly Extremities: Patient with chronic ischemia  of his toes both feet with small ulcerations on the left, he has no palpable pedal pulses in either foot Neurologic: Gait: Patient has trouble ambulating especially with right leg weakness  Diagnostic Studies & Laboratory data:     Recent Radiology Findings:  Nm Pet Image Initial (pi) Skull Base To Thigh  07/19/2014   CLINICAL DATA:  Initial treatment strategy for left lower lobe lung nodule.  EXAM: NUCLEAR MEDICINE PET SKULL BASE TO THIGH  TECHNIQUE: 9.8 mCi F-18 FDG was injected intravenously. Full-ring PET imaging was performed from the skull base to thigh after the radiotracer. CT data was obtained and used for attenuation correction and anatomic localization.  FASTING BLOOD GLUCOSE:  Value: 163 mg/dl  COMPARISON:  Screening chest CT on 07/11/2014  FINDINGS: NECK  No hypermetabolic lymph nodes in the neck.  CHEST  No hypermetabolic mediastinal or hilar nodes. Pleural-based nodular opacity in the posterior left lower lobe measures 1.5 x 2.6 cm, which is stable in size. This shows low-grade metabolic activity with SUV max of 2.4.  A 5 mm pleural-based nodule is all seen in the posterior right lower lobe on image 58/series 7 which is  stable and shows no hypermetabolic activity although it is too small to characterize by PET.  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 Mild emphysema again demonstrated.  ABDOMEN/PELVIS  No abnormal hypermetabolic activity within the liver, pancreas, adrenal glands, or spleen. No hypermetabolic lymph nodes in the abdomen or pelvis.  Mildly enlarged prostate seen with mass effect on bladder base.  SKELETON  No focal hypermetabolic activity to suggest skeletal metastasis.  IMPRESSION: 1.5 x 2.6 cm pleural-based nodule in posterior left lower lobe shows low-grade metabolic activity, which is nonspecific. Differential considerations include low-grade malignancy, rounded atelectasis, and inflammatory or infectious process.  Nonspecific 5 mm pleural-based nodule in posterior right lower lobe shows no metabolic activity, but is too small to characterize by PET. Continued attention on follow-up CT recommended.  No evidence of thoracic nodal metastases or distant metastatic disease.  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 Recommend semi-annual imaging followup by CTA or MRA and referral to cardiothoracic surgery if not already obtained. This recommendation follows 2010 ACCF/AHA/AATS/ACR/ASA/SCA/SCAI/SIR/STS/SVM Guidelines for the Diagnosis and Management of Patients With Thoracic Aortic Disease. Circulation. 2010; 121: Z366-Y403.   Electronically Signed   By: Earle Gell M.D.   On: 07/19/2014 10:11   Ct Chest Lung Ca Screen Low Dose W/o Cm  07/11/2014   CLINICAL DATA:  74 year old male current smoker with 90 pack-year history of smoking. Lung cancer screening examination.  EXAM: CT CHEST WITHOUT CONTRAST  TECHNIQUE: Multidetector CT imaging of the chest was performed following the standard protocol without IV contrast.  COMPARISON:  No priors.  FINDINGS: Mediastinum/Lymph Nodes: Heart size is normal. There is no significant pericardial fluid, thickening or pericardial calcification. No  pathologically enlarged mediastinal or hilar lymph nodes. There is atherosclerosis of the thoracic aorta, the great vessels of the mediastinum and the coronary arteries, including calcified atherosclerotic plaque in the left main, left anterior descending, left circumflex and right coronary arteries. In addition, there is mild aneurysmal dilatation (4.5 cm) of the ascending thoracic aorta. Calcifications of the aortic valve. Please note that accurate exclusion of hilar adenopathy is limited on noncontrast CT scans. Esophagus is unremarkable in appearance. Saber sheath trachea. No axillary lymphadenopathy.  Lungs/Pleura: In the posterior aspect of the left lower lobe (image 181 of series 3) there is a pleural-based nodular opacity which has a  volume derived mean diameter of approximately 23.8 mm. Tiny subpleural nodule also noted in the periphery of the right lower lobe (image 280 of series 3) as well, with a mean diameter of 6.9 mm. No acute consolidative airspace disease. No pleural effusions. 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 Mild diffuse bronchial wall thickening with mild centrilobular and paraseptal emphysema.  Upper Abdomen: Atherosclerosis.  Otherwise, unremarkable.  Musculoskeletal/Soft Tissues: There are no aggressive appearing lytic or blastic lesions noted in the visualized portions of the skeleton.  IMPRESSION: 1. Lung-RADS Category 4BS, suspicious. Specifically, there is a large pleural based nodule in the posterior left lower lobe which has a volume derived mean diameter of approximately 23.8 mm. Given the apparent asbestos related pleural disease, this could simply represent an area of rounded atelectasis, however, the possibility of a malignant bronchogenic neoplasm, or early malignant mesothelioma is not excluded, and further evaluation with PET-CT is recommended at this time. 2. The "S" modifier above refers to potentially clinically  significant non lung cancer related findings. Specifically, Atherosclerosis, including left main and 3 vessel coronary artery disease. Assessment for potential risk factor modification, dietary therapy or pharmacologic therapy may be warranted, if clinically indicated. 3. In addition, there is mild aneurysmal dilatation (4.5 cm in diameter) of the ascending thoracic aorta. Ascending thoracic aortic aneurysm. Recommend semi-annual imaging followup by CTA or MRA and referral to cardiothoracic surgery if not already obtained. This recommendation follows 2010 ACCF/AHA/AATS/ACR/ASA/SCA/SCAI/SIR/STS/SVM Guidelines for the Diagnosis and Management of Patients With Thoracic Aortic Disease. Circulation. 2010; 121: X726-O035. 4. Mild diffuse bronchial wall thickening with mild centrilobular and paraseptal emphysema; imaging findings suggestive of underlying COPD. These results were called by telephone at the time of interpretation on 07/11/2014 at 9:58 am to Dr. Claris Gower, who verbally acknowledged these results.   Electronically Signed   By: Vinnie Langton M.D.   On: 07/11/2014 10:05     I have independently reviewed the above radiology studies  and reviewed the findings with the patient.   Recent Lab Findings: Lab Results  Component Value Date   WBC 7.1 07/26/2014   HGB 14.1 07/26/2014   HCT 41.6 07/26/2014   PLT 194 07/26/2014   GLUCOSE 210* 01/12/2014   ALT <5 01/12/2014   AST 16 01/12/2014   NA 142 01/12/2014   K 4.7 01/12/2014   CL 103 01/12/2014   CREATININE 1.43* 01/12/2014   BUN 34* 01/12/2014   CO2 23 01/12/2014   INR 1.03 07/26/2014   HGBA1C 6.7* 02/03/2013   Aortic Size Index=     4.5    /Body surface area is 2.10 meters squared. = 2.11  < 2.75 cm/m2      4% risk per year 2.75 to 4.25          8% risk per year > 4.25 cm/m2    20% risk per year  lOWER EXTREMITIY vASCULAR STUDY: Summary: Evidence of 50-99% stenosis of the right mid femoral artery. Occlusion of the right distal  femoral artery (known occlusion). Recanalized flow to the right popliteal artery. Occlusion of the right ATA (known occlusion). Near occlusion of the right PTA. Monophasic flow in the right peroneal artery.  Other specific details can be found in the table(s) above. Prepared and Electronically Authenticated by  Curt Jews 2016-06-27T16:38:16   Assessment / Plan:   1.5 x 2.6 cm pleural-based nodule in posterior left lower lobe-  CT-guided needle biopsy of the left lower lobe pleural-based mass DONE ,  Lung, needle/core biopsy(ies), left lower lobe - BENIGN LUNG PARENCHYMA WITH MILD INFLAMMATION AND INTERSTITIAL FIBROSIS. - THERE IS NO EVIDENCE OF GRANULOMATA OR MALIGNANCY  Nonspecific 5 mm pleural-based nodule in posterior right lower lobe- the small nodule in the right lung less than 5 mm in size is too small to characterize and will follow with serial CT   Ascending thoracic aortic aneurysm-mild aneurysmal dilatation (4.5 cm in diameter) of the ascending thoracic aorta-patient has no murmur of aortic insufficiency  or aortic stenosis but with the dilated ascending aorta we will obtain a echocardiogram to evaluate his aortic root, rule out bicuspid aortic valve. At the current size 4.5 cm we would not recommend elective surgical intervention , but will need serial CT scans to evaluate for change . The patient has no family history of sudden cardiac death or ascending aortic dissection.  Atherosclerosis, including left main and 3 vessel coronary artery disease - the patient denies any definite anginal symptoms but does have known left main and three-vessel coronary artery disease on a nonspecific CT scan based on calcium only. He reports that he had seen Dr. Wynonia Lawman, we will have him follow-up with Dr. Wynonia Lawman concerning his coronary artery disease and evaluation with echocardiogram with dilated aorta  Patient has symptomatic bilateral claudication right leg greater than left and on physical  exam evidence of significant peripheral vascular disease- bilateral iliac artery stents were previously placed by Dr. Gwenlyn Found, the patient refused to go back and see him. Has been refereed to vascular surgery for evaluation of lower extremity vascular disease - right leg doppler only was done I  intended to check both legs  I will see the patient back in 3-4 months with repeat ct of chest no contrast to follow up on left lung lesion  Grace Isaac MD      [address] [place] [address] Office [phone]   Beeper [phone]  08/03/2014 3:42 PM

## 2014-08-05 ENCOUNTER — Encounter: Payer: Self-pay | Admitting: Cardiology

## 2014-08-05 DIAGNOSIS — I2510 Atherosclerotic heart disease of native coronary artery without angina pectoris: Secondary | ICD-10-CM | POA: Insufficient documentation

## 2014-08-05 DIAGNOSIS — I7121 Aneurysm of the ascending aorta, without rupture: Secondary | ICD-10-CM | POA: Insufficient documentation

## 2014-08-05 DIAGNOSIS — I712 Thoracic aortic aneurysm, without rupture: Secondary | ICD-10-CM | POA: Insufficient documentation

## 2014-08-11 ENCOUNTER — Ambulatory Visit: Payer: Medicare Other | Admitting: Cardiothoracic Surgery

## 2014-08-17 ENCOUNTER — Ambulatory Visit (INDEPENDENT_AMBULATORY_CARE_PROVIDER_SITE_OTHER): Payer: Medicare Other | Admitting: Vascular Surgery

## 2014-08-17 ENCOUNTER — Encounter: Payer: Self-pay | Admitting: Vascular Surgery

## 2014-08-17 VITALS — BP 138/79 | HR 74 | Ht 73.0 in | Wt 196.2 lb

## 2014-08-17 DIAGNOSIS — I739 Peripheral vascular disease, unspecified: Secondary | ICD-10-CM

## 2014-08-17 NOTE — Progress Notes (Signed)
[facility]  Patient name: Marc [name] MRN: [id number] DOB: 02/17/1940 Sex: male  REASON FOR CONSULT: Peripheral vascular disease. Referred by Dr. Ceasar Mons.  HPI: Marc [name] is a 74 y.o. male who underwent a lower extremity vascular study which showed evidence of an occlusion of his right distal femoral artery. He was sent for vascular evaluation.  I have reviewed the records from his visit with Dr. Servando Snare she was seen for evaluation of an abnormal screening CT scan of the chest. She had a nonspecific 5 mm pleural-based nodule in the posterior right lower lobe and this was to be followed with serial CT scan. She also had mild aneurysmal dilatation of this ascending thoracic aorta which was 4.5 cm in diameter. This was also to be followed. The patient was also noted to have bilateral lower extremity claudication that was more significant on the right side. The patient has had bilateral iliac artery stents placed by Dr. Quay Burow. The Doppler study mentioned above showed evidence of infrainguinal arterial occlusive disease on the right and the patient wished to be followed by [facility].   On my history, he denies any history of claudication, or rest pain. He does state that he had a wound on his right heel some time ago that required placement of a skin graft but ultimately healed.  He does smoke anywhere from a pack to 2 packs a day depending on the amount of stress.   Past Medical History  Diagnosis Date  . Hypertension   . Ulcer disease   . Colon polyp   . Gout   . Bradycardia   . Hypercholesteremia   . PAD (peripheral artery disease)   . Chronic bronchitis   . Type II diabetes mellitus   . History of GI bleed 2007  . Gout   . Tobacco abuse disorder 03/10/2012  . GERD (gastroesophageal reflux disease)   . Pneumonia     denies   Family History  Problem Relation Age of Onset  . Colon cancer Paternal Uncle     Uncle  . Heart disease  Father   . Hypertension Father   . Heart attack Father   . Heart disease Mother   . Diabetes Mother   . Hypertension Mother    SOCIAL HISTORY: History  Substance Use Topics  . Smoking status: Current Every Day Smoker -- 2.00 packs/day for 58 years    Types: Cigarettes  . Smokeless tobacco: Former Systems developer    Types: Chew  . Alcohol Use: 0.0 oz/week    0 Standard drinks or equivalent per week     Comment:  quit for 22 yr; egg nog christmas   No Known Allergies Current Outpatient Prescriptions  Medication Sig Dispense Refill  . allopurinol (ZYLOPRIM) 300 MG tablet Take 300 mg by mouth daily.    . clopidogrel (PLAVIX) 75 MG tablet Take 1 tablet by mouth daily.    . furosemide (LASIX) 80 MG tablet Take 40 mg by mouth daily.     [name] gabapentin (NEURONTIN) 600 MG tablet Take 1,200 mg by mouth every 8 (eight) hours.     [name] glimepiride (AMARYL) 4 MG tablet Take 4 mg by mouth 2 (two) times daily.     [name] lisinopril (PRINIVIL,ZESTRIL) 20 MG tablet Take 20 mg by mouth daily.      [name] lovastatin (MEVACOR) 40 MG tablet Take 40 mg by mouth at bedtime.    [name] omeprazole (PRILOSEC OTC) 20 MG tablet Take  40 mg by mouth daily.      No current facility-administered medications for this visit.   REVIEW OF SYSTEMS: Valu.Nieves ] denotes positive finding; [  ] denotes negative finding  CARDIOVASCULAR:  [ ]  chest pain   [ ]  chest pressure   [ ]  palpitations   [ ]  orthopnea   [ ]  dyspnea on exertion   Valu.Nieves ] claudication   [ ]  rest pain   [ ]  DVT   [ ]  phlebitis PULMONARY:   [ ]  productive cough   [ ]  asthma   [ ]  wheezing NEUROLOGIC:   [ ]  weakness  [ ]  paresthesias  [ ]  aphasia  [ ]  amaurosis  [ ]  dizziness HEMATOLOGIC:   Valu.Nieves ] bleeding problems   [ ]  clotting disorders MUSCULOSKELETAL:  [ ]  joint pain   [ ]  joint swelling [ ]  leg swelling GASTROINTESTINAL: [ ]   blood in stool  [ ]   hematemesis GENITOURINARY:  [ ]   dysuria  [ ]   hematuria PSYCHIATRIC:  [ ]  history of major depression INTEGUMENTARY:  [ ]  rashes  [ ]   ulcers CONSTITUTIONAL:  [ ]  fever   [ ]  chills  PHYSICAL EXAM: Filed Vitals:   08/17/14 1114  BP: 138/79  Pulse: 74  Height: 6\' 1"  (1.854 m)  Weight: 196 lb 3.2 oz (88.996 kg)  SpO2: 96%   GENERAL: The patient is a well-nourished male, in no acute distress. The vital signs are documented above. CARDIAC: There is a regular rate and rhythm.  VASCULAR: I do not detect carotid bruits. He has palpable femoral pulses bilaterally. I cannot palpate popliteal or pedal pulses on either side. Both feet are warm and well perfused. PULMONARY: There is good air exchange bilaterally without wheezing or rales. ABDOMEN: Soft and non-tender with normal pitched bowel sounds.  MUSCULOSKELETAL: There are no major deformities or cyanosis. NEUROLOGIC: No focal weakness or paresthesias are detected. SKIN: There are no ulcers or rashes noted. PSYCHIATRIC: The patient has a normal affect.  DATA:  I reviewed the duplex scan that was sent from Dr. Cain Saupe office which shows evidence of infrainguinal arterial occlusive disease on the right.  MEDICAL ISSUES:  PERIPHERAL VASCULAR DISEASE: They start his exam he has evidence of infrainguinal arterial occlusive disease bilaterally. We have discussed the importance of tobacco cessation. I have encouraged him to walk as much as possible. Given that he is essentially asymptomatic I would not recommend any further workup at this time unless he developed significant disabling claudication, rest pain, or nonhealing ulcer. I'll plan on seeing him in one year with follow up ABIs which I have ordered. He knows to call sooner if he has problems.   Deitra Mayo [facility]: [phone]

## 2014-08-17 NOTE — Addendum Note (Signed)
Addended by: Mena Goes on: 08/17/2014 02:33 PM   Modules accepted: Orders

## 2014-08-26 IMAGING — DX DG LUMBAR SPINE 2-3V
1 series · 1 of 1 positions shown · non-contrast
Comparison: MRI 10/17/2012

CLINICAL DATA: L4-5 discectomy and fusion

EXAM:
LUMBAR SPINE - 2-3 VIEW

[lat]
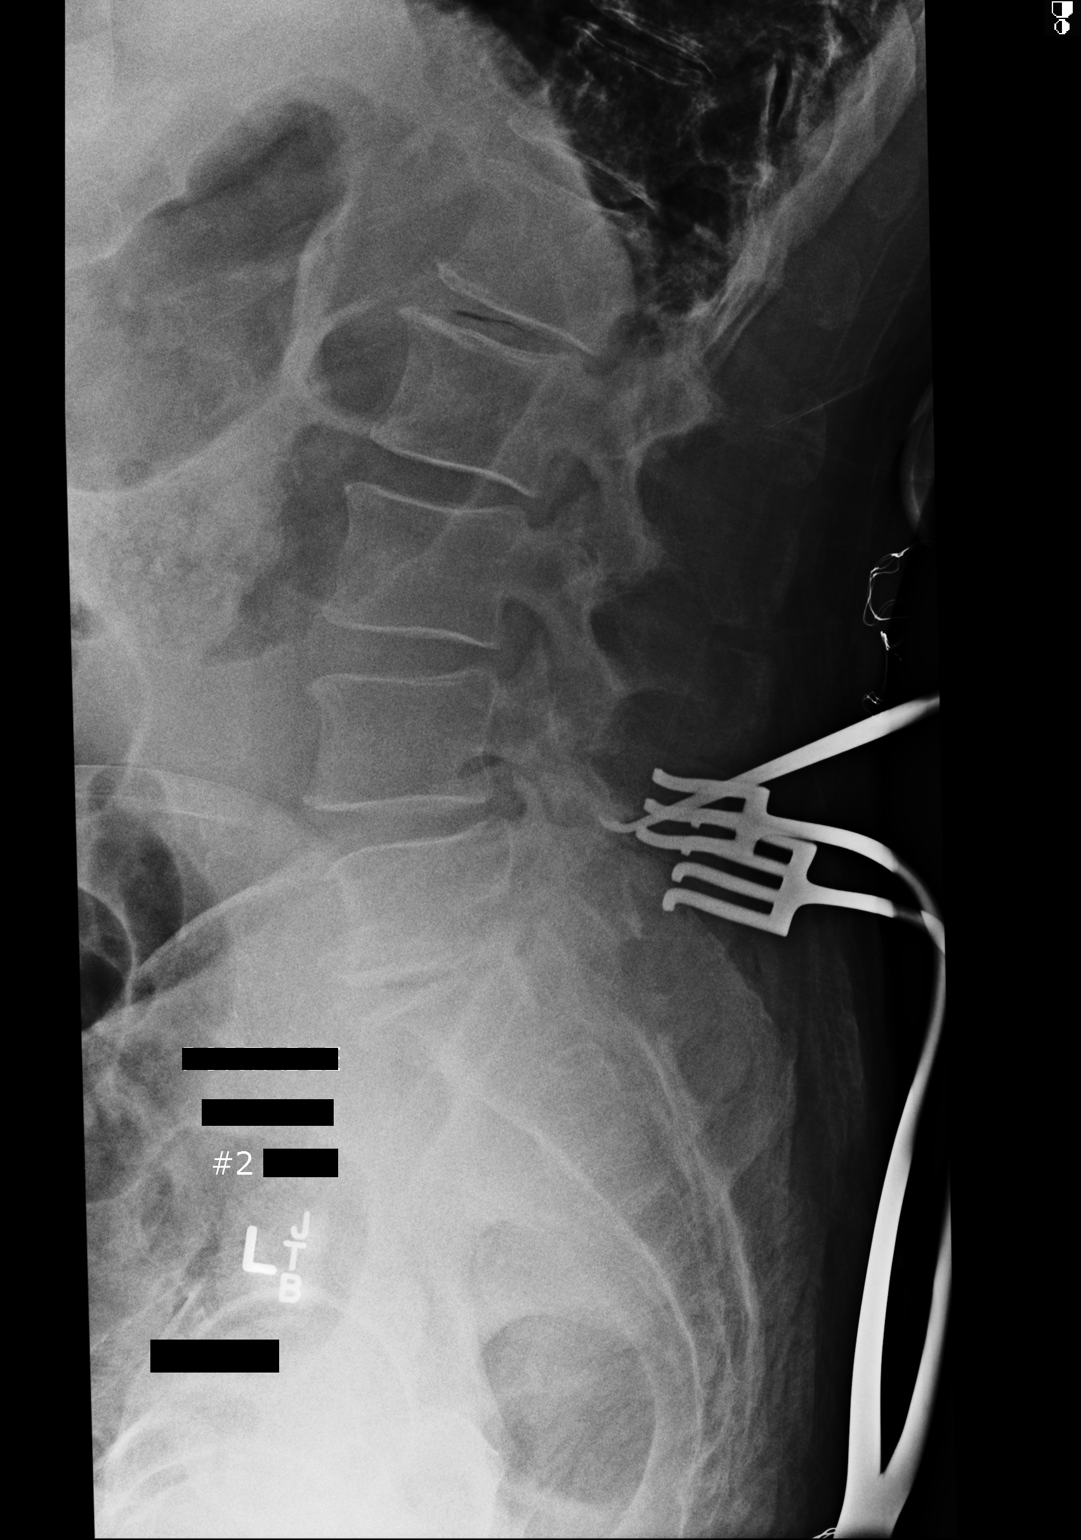

[1 of 1 positions shown; findings below may reference images not displayed]

FINDINGS: Initial film shows needles at the spinous processes of L3 and L4.
Second film shows tissue spreaders posteriorly with a probe directed
towards L4-5 disc level.
IMPRESSION: L4-5 localized

## 2014-09-02 ENCOUNTER — Encounter: Payer: Medicare Other | Admitting: Vascular Surgery

## 2014-10-28 NOTE — Progress Notes (Signed)
Patient ID: Marc [name], male   DOB: January 15, 1941, 74 y.o.   MRN: [id number]  Marc, [name]    Date of visit:  08/05/2014 DOB:  09/23/1940    Age:  74 yrs. Medical record number:  [id number]     Account number:  [id number] Primary Care Provider: Claris [name] ____________________________ CURRENT DIAGNOSES  1. CAD Native without angina  2. Ascending aortic aneurysm  3. Hypertensive heart disease without heart failure  4. Type 2 diabetes mellitus with diabetic polyneuropathy  5. Hyperlipidemia  6. Atherosclerosis Of Native Arteries Of Extremities With Intermittent Claudication, Bilateral Legs  7. Chronic kidney disease, stage 3 (moderate)  8. Nicotine dependence, cigarettes, uncomplicated ____________________________ ALLERGIES  No Known Allergies ____________________________ MEDICATIONS  1. lisinopril 20 mg tablet, 1 p.o. daily  2. glimepiride 4 mg tablet, 2 p.o. daily  3. allopurinol 300 mg tablet, 1 p.o. daily  4. lovastatin 40 mg tablet, 1 p.o. daily  5. gabapentin 600 mg tablet, 2 tid  6. omeprazole 20 mg capsule,delayed release, 1 p.o. daily  7. aspirin 81 mg chewable tablet, 1 p.o. daily  8. clopidogrel 75 mg tablet, 1 p.o. daily ____________________________ CHIEF COMPLAINTS  Followup of Atherosclerosis Of Native Arteries Of Extremities With Intermittent Claudication, Bilate ____________________________ HISTORY OF PRESENT ILLNESS Patient returns for cardiac followup. He tolerated his back surgery fairly well but continues to have significant low back pain and continued pain. He had a CT scan that showed some pulmonary nodules but also showed a dilated aorta as well as coronary calcification with 3 vessels. He has since been seen by Dr. Servando Snare who asked him to be seen again for cardiology evaluation. Evidently he had a needle biopsy done of one of the lung lesions that was hypermetabolic but did not show any evidence of malignancy. He has peripheral vascular disease with  claudication. He unfortunately continues to smoke cigarettes and states that he really doesn't want to quit he like we have asked him to. He had a myocardial perfusion scan done a little over 2 years ago showing no ischemia with an ejection fraction of 59%. His aorta is 4.5 cm not large enough to need surgery at this time. He denies angina and has no PND orthopnea or edema. His exercise tolerance is limited due to back pain as well as claudication. ____________________________ PAST HISTORY  Past Medical Illnesses:  hypertension, DM-non-insulin dependent, hyperlipidemia, GI bleed from ulcers 2009;  Cardiovascular Illnesses:  peripheral vascular disease;  Surgical Procedures:  laminectomy cervical, splenectomy, laminectomy lumbar, knee replacement-rt;  NYHA Classification:  II;  Canadian Angina Classification:  Class 0: Asymptomatic;  Cardiology Procedures-Invasive:  no history of prior cardiac procedures;  Cardiology Procedures-Noninvasive:  treadmill, treadmill cardiolite;  Peripheral Vascular Procedures:  stent left iliac 01/2012;  LVEF of 58% documented via nuclear study on 01/24/2012,   ____________________________ CARDIO-PULMONARY TEST DATES EKG Date:  01/18/2014;  Nuclear Study Date:  01/24/2012;   ____________________________ FAMILY HISTORY Brother -- Brother alive and well Brother -- Brother dead Father -- Father dead, Heart Attack Mother -- Mother dead, Congestive heart failure ____________________________ SOCIAL HISTORY Alcohol Use:  no alcohol use;  Smoking:  smokes 1-2 ppd, greater than 50 pack year history;  Diet:  regular diet;  Lifestyle:  divorced, remarried and separated;  Exercise:  no regular exercise;  Occupation:  retired and Estate manager/land agent;  Residence:  lives alone;   ____________________________ Marc [name]:  weight loss of approximately 5 lbs Eyes: denies diplopia, history of glaucoma or visual problems. Respiratory:  denies dyspnea, cough, wheezing or  hemoptysis. Cardiovascular:  please review HPI  Genitourinary-Male: no dysuria, urgency, frequency, or nocturia  Musculoskeletal:  chronic low back pain Psychiatric:  denies depression or anxiety  ____________________________ PHYSICAL EXAMINATION VITAL SIGNS  Blood Pressure:  138/70 Sitting, Left arm, regular cuff  , 144/78 Standing, Left arm and large cuff   Pulse:  88/min. Weight:  196.00 lbs. Height:  73.5"BMI: 25  Constitutional:  pleasant white male in no acute distress walks with cane Skin:  tattoos, ecchymosis present arm(s) Head:  normocephalic, normal hair pattern, no masses or tenderness Eyes:  EOMS Intact, PERRLA, C and S clear, Funduscopic exam not done. Neck:  supple, without massess. No JVD, thyromegaly or carotid bruits. Carotid upstroke normal. Chest:  normal symmetry, clear to auscultation. Cardiac:  regular rhythm, normal S1 and S2, No S3 or S4, no murmurs, gallops or rubs detected. Peripheral Pulses:  bilateral femoral bruits present, pulses below the femoral arteries are diminished Extremities & Back:  limitation of motion of back Neurological:  gait slow ____________________________ MOST RECENT LIPID PANEL 01/18/14  CHOL TOTL 168 mg/dl, LDL 59 NM, HDL 44 mg/dl, TRIGLYCER 325 mg/dl and CHOL/HDL 3.8 (Calc) ____________________________ IMPRESSIONS/PLAN  1. Asymptomatic coronary artery disease as manifested by coronary calcification on CT scanning with a negative myocardial perfusion scan in December 2013 2. Asymptomatic ascending thoracic aneurysm 3. Peripheral vascular disease with previous iliac stent and claudication 4. Tobacco abuse again advised to stop 5. Stage III chronic kidney disease 6. Lung nodules  Recommendations:  We talked about the importance of stopping smoking. Because of his vascular disease he would be best treated with a high intensity statin such as atorvastatin 40 year 80 mg daily. He continues on aspirin and Klonopin a girl. He does have  claudication that appears stable. I will see him back in followup in 6 months. We discussed his aortic aneurysm. He would benefit from a beta blocker such as metoprolol in terms of his aneurysm. Followup in 6 months. ____________________________ TODAYS ORDERS  1. Return Visit: 6 months  2. 12 Lead EKG: 6 months                       ____________________________ Cardiology Physician:  Kerry Hough MD [facility]

## 2014-10-30 IMAGING — CR DG KNEE 1-2V*R*
3 series · 3 of 3 positions shown · non-contrast
Comparison: 02/27/2011 MRI

CLINICAL DATA: Pain and swelling status post right total knee
replacement

EXAM:
RIGHT KNEE - 1-2 VIEW

[x knee ap right (1 of 2)]
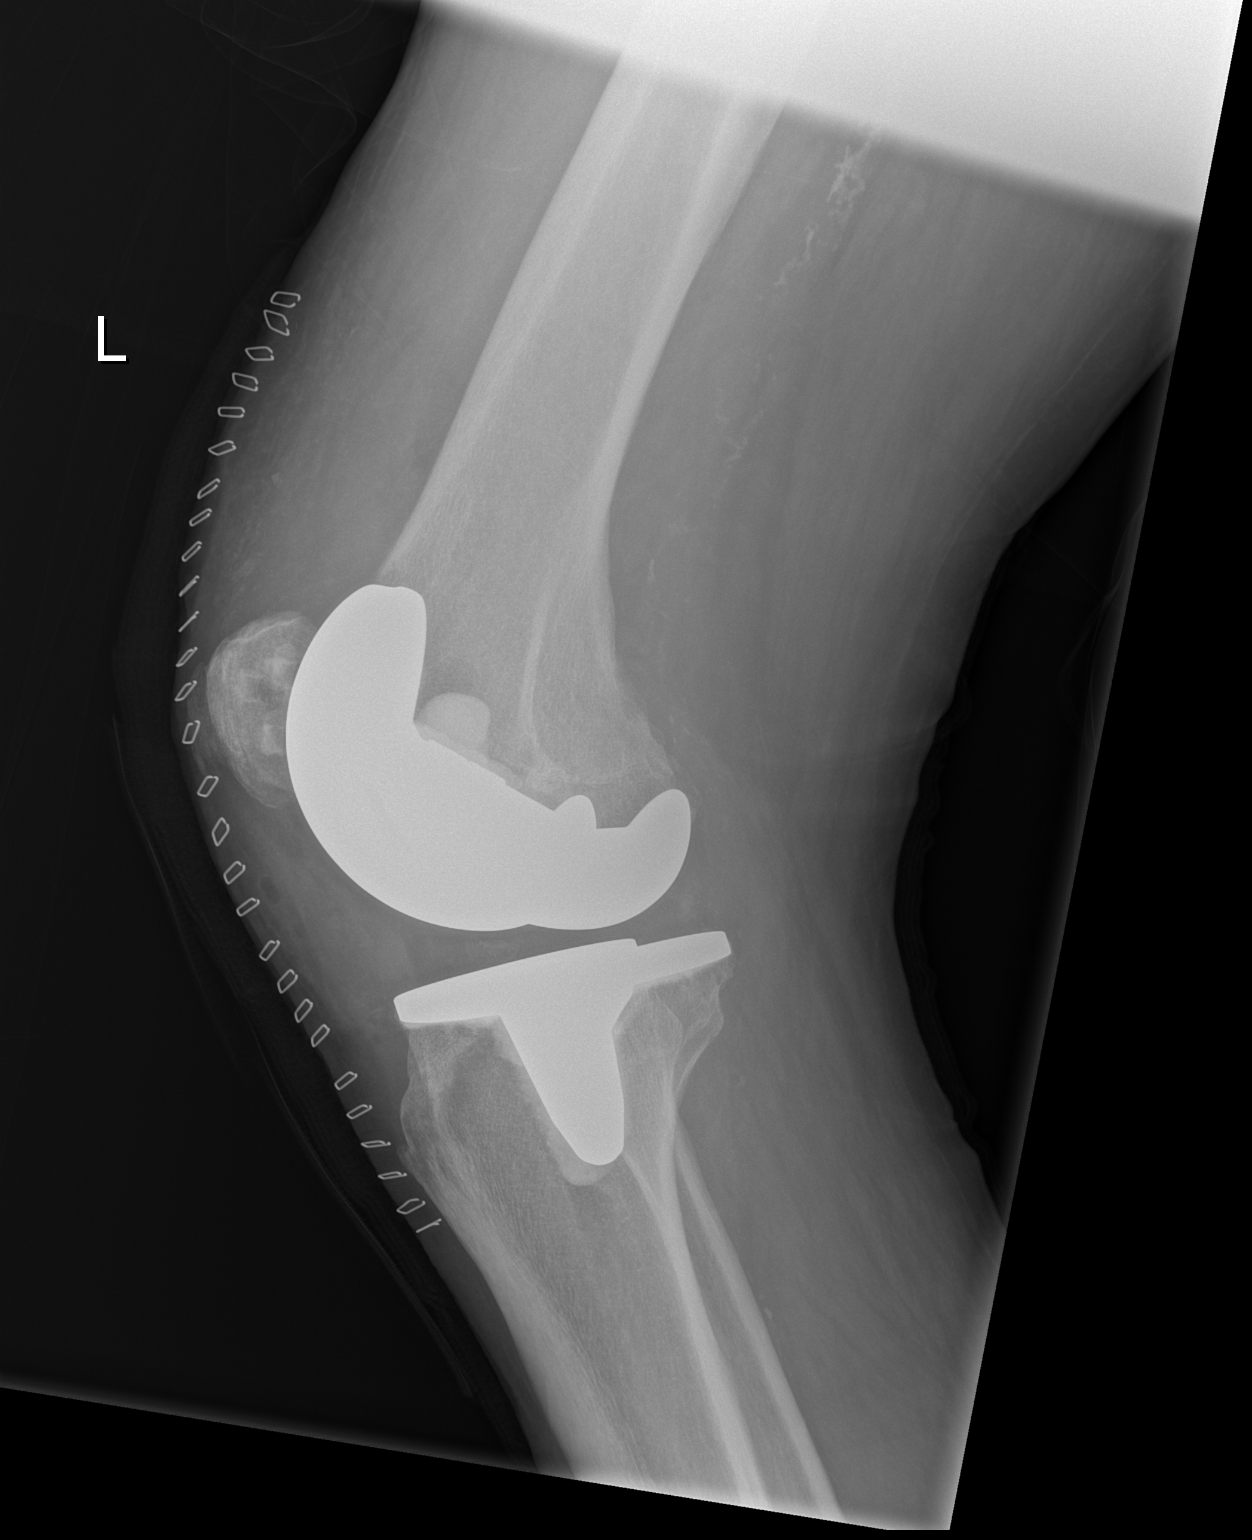

[x knee lat right]
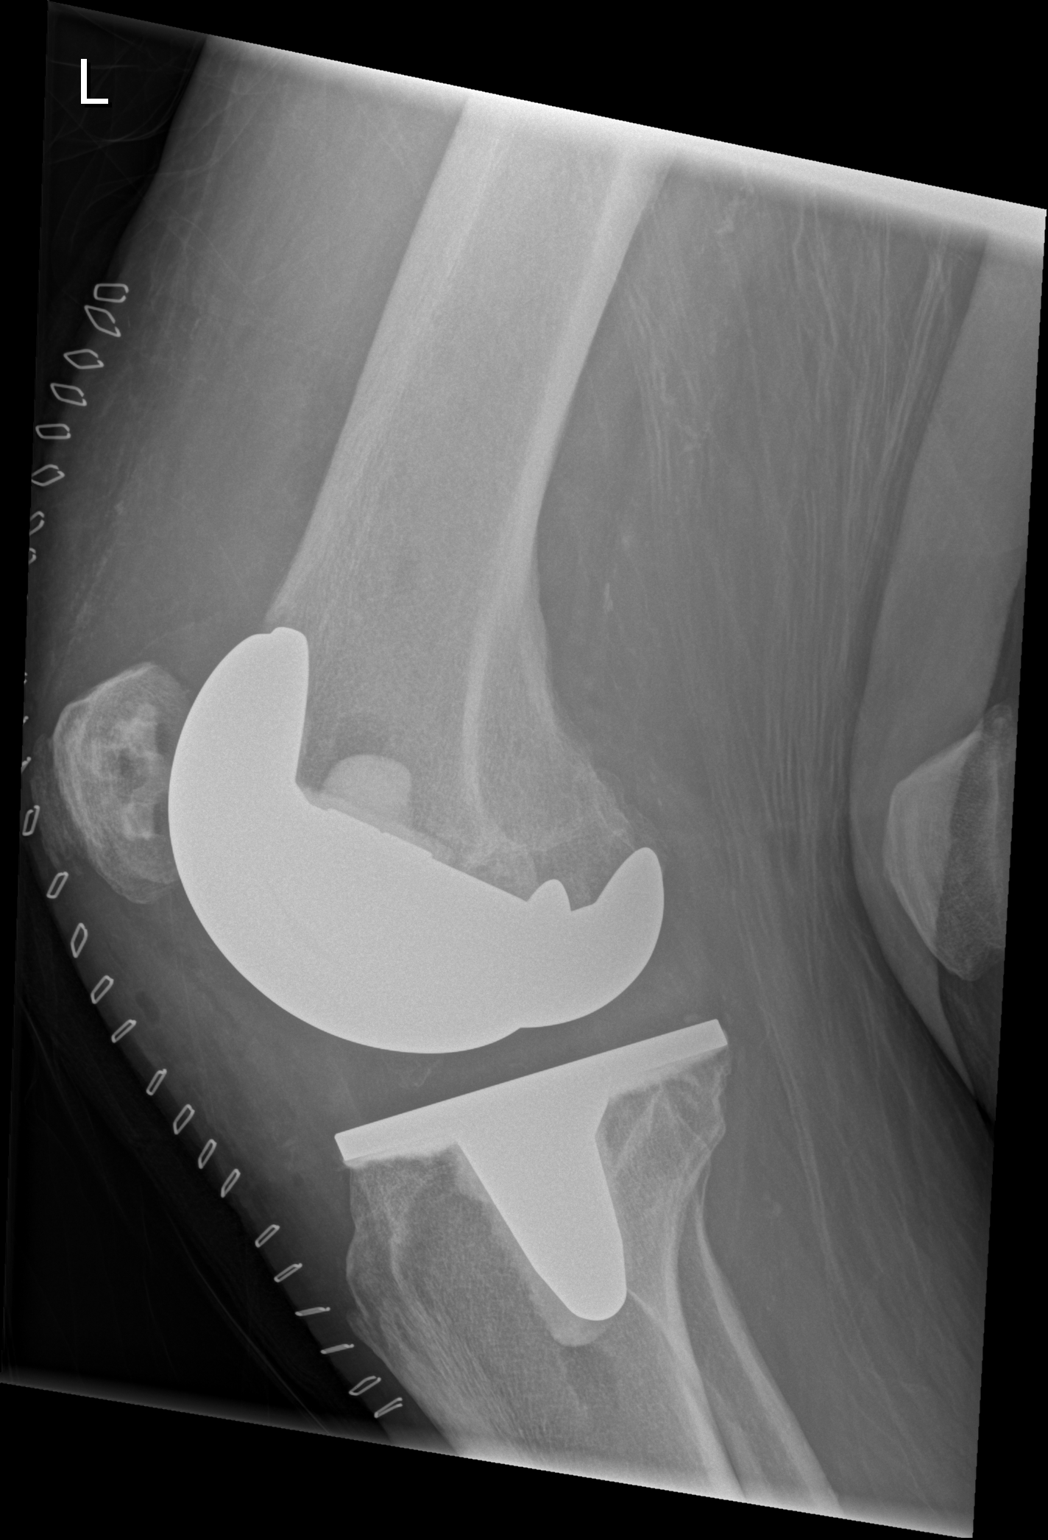

[x knee ap right (2 of 2)]
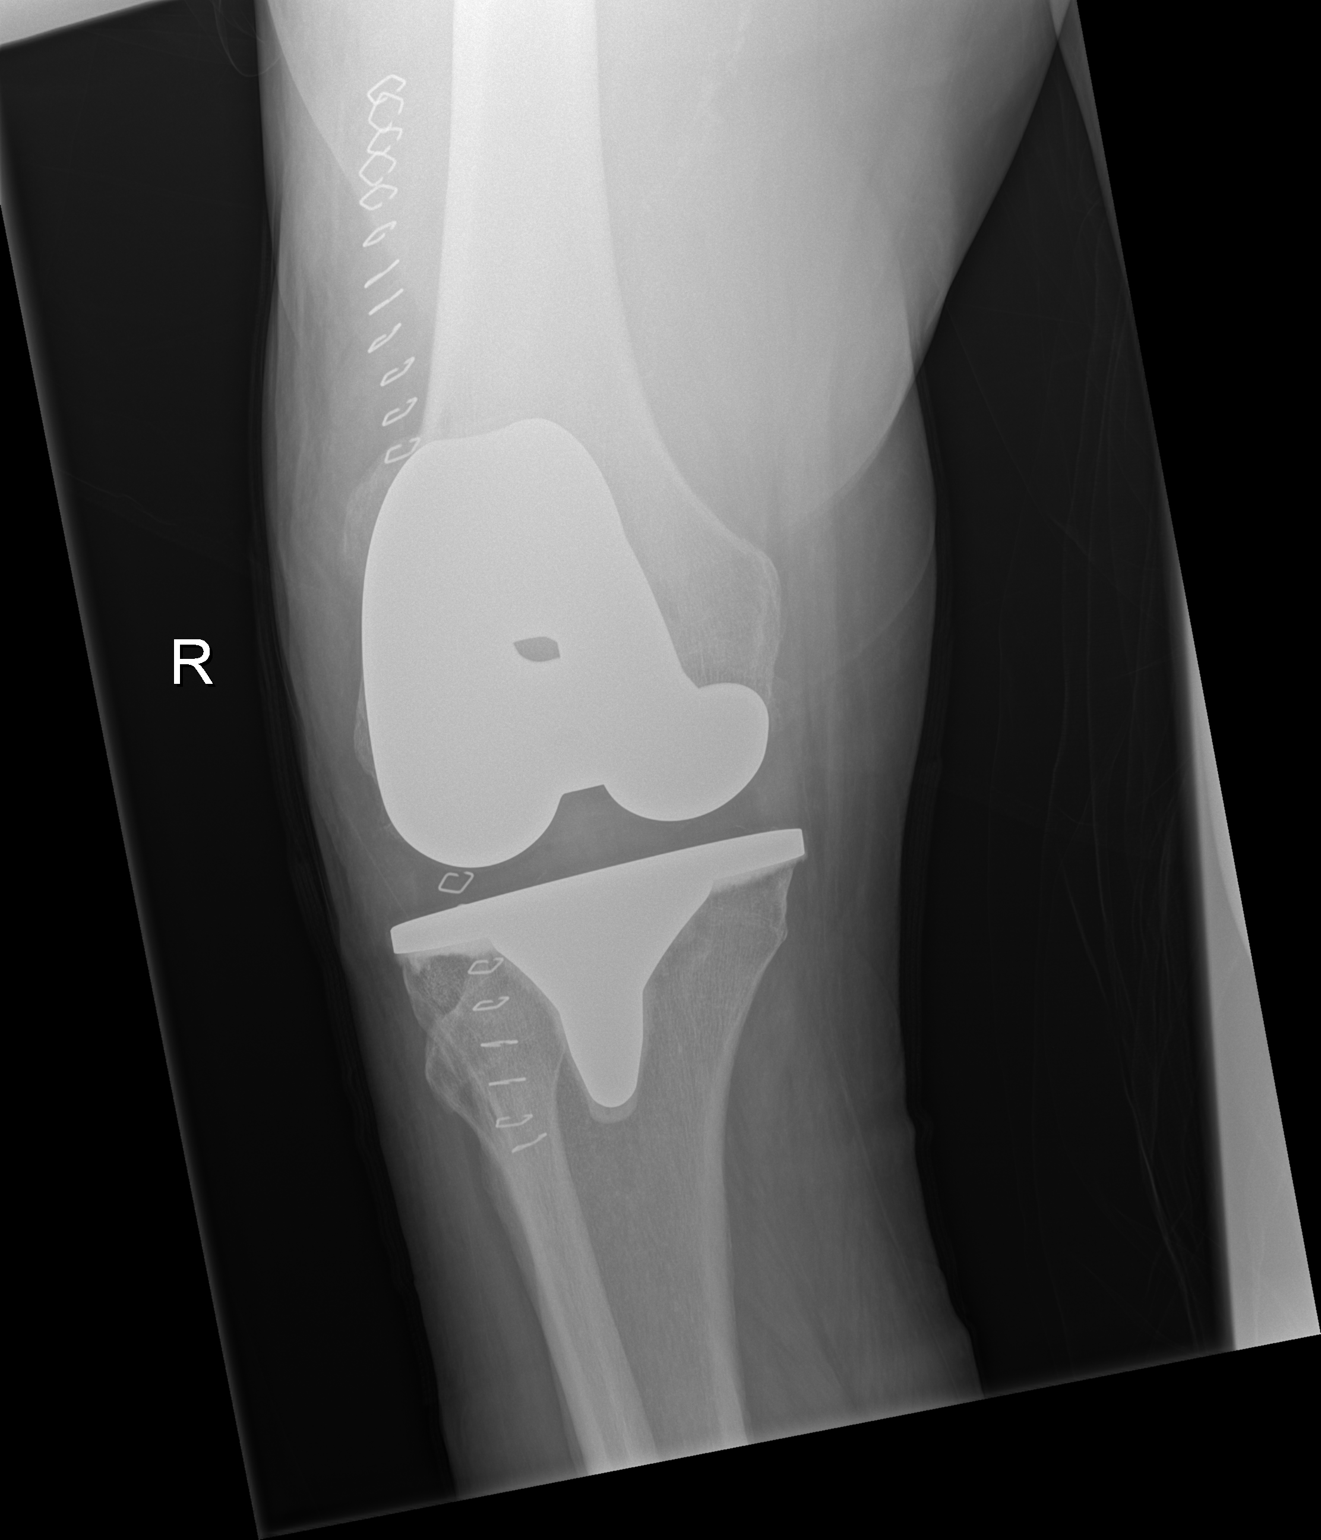

[3 of 3 positions shown; findings below may reference images not displayed]

FINDINGS: Right total knee arthroplasty has been performed. Anterior midline
staples remain in place. Components appear aligned. No definite
hardware abnormality. Negative for fracture. Small effusion
suspected on the lateral view.
IMPRESSION: Status post right knee arthroplasty. No acute fracture or hardware
abnormality

Question small joint effusion

## 2014-11-24 ENCOUNTER — Other Ambulatory Visit: Payer: Self-pay | Admitting: *Deleted

## 2014-11-24 DIAGNOSIS — R918 Other nonspecific abnormal finding of lung field: Secondary | ICD-10-CM

## 2014-12-12 ENCOUNTER — Ambulatory Visit: Payer: Medicare Other | Admitting: Cardiothoracic Surgery

## 2014-12-12 ENCOUNTER — Ambulatory Visit
Admission: RE | Admit: 2014-12-12 | Discharge: 2014-12-12 | Disposition: A | Discharge status: Home / Self Care | Payer: Medicare Other | Source: Ambulatory Visit | Attending: Cardiothoracic Surgery | Admitting: Cardiothoracic Surgery

## 2014-12-12 DIAGNOSIS — R918 Other nonspecific abnormal finding of lung field: Secondary | ICD-10-CM

## 2014-12-13 ENCOUNTER — Ambulatory Visit (INDEPENDENT_AMBULATORY_CARE_PROVIDER_SITE_OTHER): Payer: Medicare Other | Admitting: Cardiothoracic Surgery

## 2014-12-13 ENCOUNTER — Encounter: Payer: Self-pay | Admitting: Cardiothoracic Surgery

## 2014-12-13 VITALS — BP 130/57 | HR 66 | Resp 20 | Ht 73.0 in | Wt 196.0 lb

## 2014-12-13 DIAGNOSIS — J984 Other disorders of lung: Secondary | ICD-10-CM

## 2014-12-13 DIAGNOSIS — I712 Thoracic aortic aneurysm, without rupture, unspecified: Secondary | ICD-10-CM

## 2014-12-13 DIAGNOSIS — R911 Solitary pulmonary nodule: Secondary | ICD-10-CM

## 2014-12-13 NOTE — Progress Notes (Addendum)
[address]       [place] [address]             [facility] [id number] Date of Birth: 1940-02-24  Referring: [name], * Primary Care: [name], MD  Chief Complaint:    Chief Complaint  Patient presents with  . Lung Mass    3 month f/u with Chest CT, left lung lesion    History of Present Illness:    Marc [name] 74 y.o. male is seen in the office  4 months ago for abnormal screening CT scan of the chest. The patient has a long history of smoking since age 80 up to 2 packs a day for more than 30 years. He has known peripheral vascular disease, history of abnormal EKG but no known acute myocardial infarction. He is limited in his physical ability because of chronic back next in knee pain, status post back surgery 3 in right knee replacement. He has known peripheral vascular disease having stents placed in right and left iliac arteries by Dr. Gwenlyn Found. He complains of chronic leg pain bilateral and numbness in his right foot.  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   Because of the patient's smoking history Dr. Arelia Sneddon recommended a screening CT scan of the chest and the patient's referred to thoracic surgery.   NEEDLE BIOPSY  OF LEFT LUNG LESION HAS BEEN DONEHAS BEEN DONE _  Diagnosis Lung, needle/core biopsy(ies), left lower lobe - BENIGN LUNG PARENCHYMA WITH MILD INFLAMMATION AND INTERSTITIAL FIBROSIS. - THERE IS NO EVIDENCE OF GRANULOMATA OR MALIGNANCY. Enid Cutter MD   Since last seen 4 months ago the patient has had issues with renal insufficiency possibly related to his use of nonsteroidals.  Current Activity/ Functional Status:  Patient is independent with mobility/ambulation,  transfers, ADL's, IADL's. patient does have difficulty with ambulation frequently uses a cane when out in public because of leg weakness right greater than left. He notes that he is not as active as he has been over the years, stays at home and watches TV a lot   Zubrod Score: At the time of surgery this patient's most appropriate activity status/level should be described as: []     0    Normal activity, no symptoms [x]     1    Restricted in physical strenuous activity but ambulatory, able to do out light work []     2    Ambulatory and capable of self care, unable to do work activities, up and about               >50 % of waking hours                              []     3    Only limited self care, in bed greater than 50% of waking hours []     4    Completely disabled, no self care, confined to bed or chair []     5    Moribund   Past Medical History  Diagnosis Date  . Hypertension   . Ulcer disease   .  Colon polyp   . Gout   . Bradycardia   . Hypercholesteremia   . PAD (peripheral artery disease) ([place])   . Chronic bronchitis ([place])   . Type II diabetes mellitus ([place])   . History of GI bleed 2007  . Gout   . Tobacco abuse disorder 03/10/2012  . GERD (gastroesophageal reflux disease)   . Pneumonia     denies    Past Surgical History  Procedure Laterality Date  . Splenectomy  ~ 1957  . Anterior cervical decomp/discectomy fusion  ~ 2007  . Upper endoscopy w/ sclerotherapy  ~ 2007  . Polypectomy    . Colonoscopy    . Iliac artery stent      left common/notes (02/04/2012)  . Hernia repair  ~ 2007    UHR (02/04/2012)  . Tonsillectomy  ~ 1947  . Lumbar laminectomy  11/14  . Total knee arthroplasty Right 02/03/2013    Procedure: TOTAL KNEE ARTHROPLASTY;  Surgeon: [name], MD;  Location: [facility];  Service: Orthopedics;  Laterality: Right;  . Lower extremity angiogram N/A 02/04/2012    Procedure: LOWER EXTREMITY ANGIOGRAM;  Surgeon: Lorretta Harp, MD;  Location: [facility] CATH LAB;  Service:  Cardiovascular;  Laterality: N/A;  . Percutaneous stent intervention Left 02/04/2012    Procedure: PERCUTANEOUS STENT INTERVENTION;  Surgeon: Lorretta Harp, MD;  Location: [facility] CATH LAB;  Service: Cardiovascular;  Laterality: Left;  lt ext iliac stent  . Replacement total knee Right   . Anterior fusion lumbar spine  01/20/2014  . Anterior lat lumbar fusion Left 01/19/2014    Procedure: LATERAL INTERBODY FUSION 1 LEVEL;  Surgeon: Sinclair Ship, MD;  Location: [facility];  Service: Orthopedics;  Laterality: Left;  Left lumbar 3-4 lateral interbody fusion with instrumentation, allograft    Family History  Problem Relation Age of Onset  . Colon cancer Paternal Uncle     Uncle  . Heart disease Father   . Hypertension Father   . Heart attack Father   . Heart disease Mother   . Diabetes Mother   . Hypertension Mother     Social History   Social History  . Marital Status: Legally Separated    Spouse Name: N/A  . Number of Children: 2  . Years of Education: N/A   Occupational History  . retired   . cattle farm     owner   Social History Main Topics  . Smoking status: Current Every Day Smoker -- 2.00 packs/day for 58 years    Types: Cigarettes  . Smokeless tobacco: Former Systems developer    Types: Chew  . Alcohol Use: 0.0 oz/week    0 Standard drinks or equivalent per week     Comment:  quit for 22 yr; egg nog christmas  . Drug Use: No  . Sexual Activity: No   Other Topics Concern  . Not on file   Social History Narrative   Patient lives at home with his spouse.   Caffeine Use: 2 cups daily    History  Smoking status  . Current Every Day Smoker -- 2.00 packs/day for 58 years  . Types: Cigarettes  Smokeless tobacco  . Former Systems developer  . Types: Chew    History  Alcohol Use  . 0.0 oz/week  . 0 Standard drinks or equivalent per week    Comment:  quit for 22 yr; egg nog christmas     No Known Allergies  Current Outpatient Prescriptions  Medication Sig Dispense Refill  .  allopurinol (ZYLOPRIM) 300 MG tablet Take 300 mg by mouth daily.    . clopidogrel (PLAVIX) 75 MG tablet Take 1 tablet by mouth daily.    . furosemide (LASIX) 80 MG tablet Take 40 mg by mouth daily.     [name] gabapentin (NEURONTIN) 600 MG tablet Take 1,200 mg by mouth every 8 (eight) hours.     [name] glimepiride (AMARYL) 4 MG tablet Take 4 mg by mouth 2 (two) times daily.     [name] lisinopril (PRINIVIL,ZESTRIL) 20 MG tablet Take 20 mg by mouth daily.      [name] lovastatin (MEVACOR) 40 MG tablet Take 40 mg by mouth at bedtime.    [name] omeprazole (PRILOSEC OTC) 20 MG tablet Take 40 mg by mouth daily.      No current facility-administered medications for this visit.     Review of Systems:     Cardiac Review of Systems: Y or N  Chest Pain [ n   ]  Resting SOB [  n ] Exertional SOB  Blue.Reese  ]  Orthopnea [ n ]   Pedal Edema [ y  ]    Palpitations [ n ] Syncope  [ n ]   Presyncope [  n ]  General Review of Systems: [Y] = yes [  ]=no Constitional: recent weight change [ n ];  Wt loss over the last 3 months [   ] anorexia [  ]; fatigue [ y ]; nausea [n  ]; night sweats [  ]; fever [ n ]; or chills [n  ];          Dental: poor dentition[  ]; Last Dentist visit:   Eye : blurred vision [  n]; diplopia [ n  ]; vision changes [ n ];  Amaurosis fugax[  n]; Resp: cough [ n ];  wheezing[n  ];  hemoptysis[ n ]; shortness of breath[ y ]; paroxysmal nocturnal dyspnea[ n ]; dyspnea on exertion[ y ]; or orthopnea[  ];  GI:  gallstones[  ], vomiting[ n ];  dysphagia[  ]; melena[  ];  hematochezia [  ]; heartburn[  ];   Hx of  Colonoscopy[  ]; GU: kidney stones [  ]; hematuria[n  ];   dysuria [  ];  nocturia[  ];  history of     obstruction [  ]; urinary frequency [  ]             Skin: rash, swelling[  ];, hair loss[ y ];  peripheral edema[y  ];  or itching[  ]; Musculosketetal: myalgias[y  ];  joint swelling[y  ];  joint erythema[  ];  joint pain[y  ];  back pain[y  ];  Heme/Lymph: bruising[ y ];  bleeding[ n ];  anemia[ n ];  Neuro:  TIA[n  ];  headaches[  ];  stroke[  ];  vertigo[  ];  seizures[ n ];   paresthesias[y  ];  difficulty walking[y  ];  Psych:depression[  ]; anxiety[  ];  Endocrine: diabetes[n  ];  thyroid dysfunction[n  ];  Immunizations: Flu up to date [ y ]; Pneumococcal up to date Blue.Reese  ];  Other:  Physical Exam: BP 130/57 mmHg  Pulse 66  Resp 20  Ht 6\' 1"  (1.854 m)  Wt 196 lb (88.905 kg)  BMI 25.86 kg/m2  SpO2 96%  PHYSICAL EXAMINATION: General appearance: alert, cooperative and appears older than stated age Head: Normocephalic, without obvious abnormality, atraumatic Neck: no adenopathy, no carotid bruit, no  JVD, supple, symmetrical, trachea midline and thyroid not enlarged, symmetric, no tenderness/mass/nodules Lymph nodes: Cervical, supraclavicular, and axillary nodes normal. Resp: clear to auscultation bilaterally Back: symmetric, no curvature. ROM normal. No CVA tenderness. Cardio: regular rate and rhythm, S1, S2 normal, no murmur, click, rub or gallop GI: soft, non-tender; bowel sounds normal; no masses,  no organomegaly Extremities: Patient with chronic ischemia of his toes both feet with small ulcerations on the left, he has no palpable pedal pulses in either foot Neurologic: Gait: Patient has trouble ambulating especially with right leg weakness  Diagnostic Studies & Laboratory data:     Recent Radiology Findings:  Ct Chest Wo Contrast  12/12/2014  CLINICAL DATA:  Follow-up left lower lobe mass, negative biopsy, smoker EXAM: CT CHEST WITHOUT CONTRAST TECHNIQUE: Multidetector CT imaging of the chest was performed following the standard protocol without IV contrast. COMPARISON:  PET-CT dated 07/19/2014. Lung cancer screening chest CT dated 07/11/2014. FINDINGS: Mediastinum/Nodes: Heart is normal in size. No pericardial effusion. Coronary atherosclerosis. Atherosclerotic calcifications of the aortic arch. 4.2 cm ascending thoracic aortic aneurysm, unchanged. Small mediastinal lymph nodes which  do not meet pathologic CT size criteria. Visualized thyroid is unremarkable. Lungs/Pleura: 1.6 x 3.0 cm rounded subpleural opacity in the posteromedial left lower lobe (series 4/ image 41), previously 1.3 x 3.0 cm, unchanged. Additional 5 mm subpleural nodule in the right lower lobe (series 4/image 46), unchanged. Underlying mild paraseptal emphysematous changes. No focal consolidation. Calcified pleural plaques (series 3/ image 22). No pleural effusion or pneumothorax. Upper abdomen: Visualized upper abdomen is notable for vascular calcifications. Musculoskeletal: Degenerative changes of the visualized thoracolumbar spine. IMPRESSION: 1.6 x 3.0 cm rounded subpleural opacities in the posteromedial left lower lobe, unchanged. Given the benign biopsy results, the lack of hypermetabolism on PET, and the associated asbestos related pleural disease, this overall appearance remains compatible with rounded atelectasis. Consider follow-up CT chest in 6-12 months. 4.2 cm ascending thoracic aortic aneurysm, unchanged. Recommend semi-annual imaging followup by CTA or MRA and referral to cardiothoracic surgery if not already obtained. This recommendation follows 2010 ACCF/AHA/AATS/ACR/ASA/SCA/SCAI/SIR/STS/SVM Guidelines for the Diagnosis and Management of Patients With Thoracic Aortic Disease. Circulation. 2010; 121: [id number] Electronically Signed   By: Julian Hy M.D.   On: 12/12/2014 15:07    Nm Pet Image Initial (pi) Skull Base To Thigh  07/19/2014   CLINICAL DATA:  Initial treatment strategy for left lower lobe lung nodule.  EXAM: NUCLEAR MEDICINE PET SKULL BASE TO THIGH  TECHNIQUE: 9.8 mCi F-18 FDG was injected intravenously. Full-ring PET imaging was performed from the skull base to thigh after the radiotracer. CT data was obtained and used for attenuation correction and anatomic localization.  FASTING BLOOD GLUCOSE:  Value: 163 mg/dl  COMPARISON:  Screening chest CT on 07/11/2014  FINDINGS: NECK  No  hypermetabolic lymph nodes in the neck.  CHEST  No hypermetabolic mediastinal or hilar nodes. Pleural-based nodular opacity in the posterior left lower lobe measures 1.5 x 2.6 cm, which is stable in size. This shows low-grade metabolic activity with SUV max of 2.4.  A 5 mm pleural-based nodule is all seen in the posterior right lower lobe on image 58/series 7 which is stable and shows no hypermetabolic activity although it is too small to characterize by PET.  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 Mild emphysema again demonstrated.  ABDOMEN/PELVIS  No abnormal hypermetabolic activity within the liver, pancreas, adrenal glands, or spleen. No hypermetabolic lymph nodes in the abdomen or pelvis.  Mildly enlarged prostate  seen with mass effect on bladder base.  SKELETON  No focal hypermetabolic activity to suggest skeletal metastasis.  IMPRESSION: 1.5 x 2.6 cm pleural-based nodule in posterior left lower lobe shows low-grade metabolic activity, which is nonspecific. Differential considerations include low-grade malignancy, rounded atelectasis, and inflammatory or infectious process.  Nonspecific 5 mm pleural-based nodule in posterior right lower lobe shows no metabolic activity, but is too small to characterize by PET. Continued attention on follow-up CT recommended.  No evidence of thoracic nodal metastases or distant metastatic disease.  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 Recommend semi-annual imaging followup by CTA or MRA and referral to cardiothoracic surgery if not already obtained. This recommendation follows 2010 ACCF/AHA/AATS/ACR/ASA/SCA/SCAI/SIR/STS/SVM Guidelines for the Diagnosis and Management of Patients With Thoracic Aortic Disease. Circulation. 2010; [id number].   Electronically Signed   By: Earle Gell M.D.   On: 07/19/2014 10:11   Ct Chest Lung Ca Screen Low Dose W/o Cm  07/11/2014   CLINICAL DATA:  74 year old male current smoker with 90 pack-year history of  smoking. Lung cancer screening examination.  EXAM: CT CHEST WITHOUT CONTRAST  TECHNIQUE: Multidetector CT imaging of the chest was performed following the standard protocol without IV contrast.  COMPARISON:  No priors.  FINDINGS: Mediastinum/Lymph Nodes: Heart size is normal. There is no significant pericardial fluid, thickening or pericardial calcification. No pathologically enlarged mediastinal or hilar lymph nodes. There is atherosclerosis of the thoracic aorta, the great vessels of the mediastinum and the coronary arteries, including calcified atherosclerotic plaque in the left main, left anterior descending, left circumflex and right coronary arteries. In addition, there is mild aneurysmal dilatation (4.5 cm) of the ascending thoracic aorta. Calcifications of the aortic valve. Please note that accurate exclusion of hilar adenopathy is limited on noncontrast CT scans. Esophagus is unremarkable in appearance. Saber sheath trachea. No axillary lymphadenopathy.  Lungs/Pleura: In the posterior aspect of the left lower lobe (image 181 of series 3) there is a pleural-based nodular opacity which has a volume derived mean diameter of approximately 23.8 mm. Tiny subpleural nodule also noted in the periphery of the right lower lobe (image 280 of series 3) as well, with a mean diameter of 6.9 mm. No acute consolidative airspace disease. No pleural effusions. 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 Mild diffuse bronchial wall thickening with mild centrilobular and paraseptal emphysema.  Upper Abdomen: Atherosclerosis.  Otherwise, unremarkable.  Musculoskeletal/Soft Tissues: There are no aggressive appearing lytic or blastic lesions noted in the visualized portions of the skeleton.  IMPRESSION: 1. Lung-RADS Category 4BS, suspicious. Specifically, there is a large pleural based nodule in the posterior left lower lobe which has a volume derived mean diameter of  approximately 23.8 mm. Given the apparent asbestos related pleural disease, this could simply represent an area of rounded atelectasis, however, the possibility of a malignant bronchogenic neoplasm, or early malignant mesothelioma is not excluded, and further evaluation with PET-CT is recommended at this time. 2. The "S" modifier above refers to potentially clinically significant non lung cancer related findings. Specifically, Atherosclerosis, including left main and 3 vessel coronary artery disease. Assessment for potential risk factor modification, dietary therapy or pharmacologic therapy may be warranted, if clinically indicated. 3. In addition, there is mild aneurysmal dilatation (4.5 cm in diameter) of the ascending thoracic aorta. Ascending thoracic aortic aneurysm. Recommend semi-annual imaging followup by CTA or MRA and referral to cardiothoracic surgery if not already obtained. This recommendation follows 2010 ACCF/AHA/AATS/ACR/ASA/SCA/SCAI/SIR/STS/SVM Guidelines for the Diagnosis  and Management of Patients With Thoracic Aortic Disease. Circulation. 2010; 121: [id number]. 4. Mild diffuse bronchial wall thickening with mild centrilobular and paraseptal emphysema; imaging findings suggestive of underlying COPD. These results were called by telephone at the time of interpretation on 07/11/2014 at 9:58 am to Dr. Claris Gower, who verbally acknowledged these results.   Electronically Signed   By: Vinnie Langton M.D.   On: 07/11/2014 10:05     I have independently reviewed the above radiology studies  and reviewed the findings with the patient.   Recent Lab Findings: Lab Results  Component Value Date   WBC 7.1 07/26/2014   HGB 14.1 07/26/2014   HCT 41.6 07/26/2014   PLT 194 07/26/2014   GLUCOSE 210* 01/12/2014   ALT <5 01/12/2014   AST 16 01/12/2014   NA 142 01/12/2014   K 4.7 01/12/2014   CL 103 01/12/2014   CREATININE 1.43* 01/12/2014   BUN 34* 01/12/2014   CO2 23 01/12/2014   INR 1.03  07/26/2014   HGBA1C 6.7* 02/03/2013   Aortic Size Index=     4.5    /Body surface area is 2.14 meters squared. = 2.11  < 2.75 cm/m2      4% risk per year 2.75 to 4.25          8% risk per year > 4.25 cm/m2    20% risk per year  lOWER EXTREMITIY vASCULAR STUDY: Summary: Evidence of 50-99% stenosis of the right mid femoral artery. 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 Recanalized flow to the right popliteal artery. Occlusion of the right ATA (known occlusion). Near occlusion of the right PTA. Monophasic flow in the right peroneal artery.  Other specific details can be found in the table(s) above. Prepared and Electronically Authenticated by  Curt Jews 2016-06-27T16:38:16   Assessment / Plan:   Stable left plural base opacity left lower lobe, previously biopsied negative has not changed since previous scan 4 months ago. The patient does have a history of asbestos exposure in the past.   Ascending thoracic aortic aneurysm-mild aneurysmal dilatation (4.5 cm in diameter) of the ascending thoracic aorta-patient has no murmur of aortic insufficiency  or aortic stenosis unchanged since the previous scan  Atherosclerosis, including left main and 3 vessel coronary artery disease - the patient denies any definite anginal symptoms but does have known left main and three-vessel coronary artery disease on a nonspecific CT scan based on calcium only. He is followed by Dr. Wynonia Lawman cardiology  Patient has symptomatic bilateral claudication right leg greater than left and on physical exam evidence of significant peripheral vascular disease- bilateral iliac artery stents were previously placed by Dr. Gwenlyn Found, the patient refused to go back and see him. Has been refereed to vascular surgery for evaluation of lower extremity vascular disease - he is followed by Dr. Scot Dock  I will see the patient back in 8 months with repeat ct of chest no contrast to follow up on left lung lesion  and dilated ascending aorta.  I discussed with the patient various techniques to help stop smoking, he has no intention of stopping and commented the only problem he had quit smoking was keeping them lit   Grace Isaac MD      [address] [place] [address] Office [phone]   Beeper [phone]  12/13/2014 12:10 PM

## 2015-01-11 ENCOUNTER — Ambulatory Visit (HOSPITAL_COMMUNITY)
Admission: RE | Admit: 2015-01-11 | Discharge: 2015-01-11 | Disposition: A | Discharge status: Home / Self Care | Payer: Medicare Other | Source: Ambulatory Visit | Attending: Family Medicine | Admitting: Family Medicine

## 2015-01-11 ENCOUNTER — Other Ambulatory Visit (HOSPITAL_COMMUNITY): Payer: Self-pay | Admitting: Family Medicine

## 2015-01-11 DIAGNOSIS — M7989 Other specified soft tissue disorders: Secondary | ICD-10-CM | POA: Insufficient documentation

## 2015-01-11 DIAGNOSIS — R609 Edema, unspecified: Secondary | ICD-10-CM | POA: Insufficient documentation

## 2015-01-11 DIAGNOSIS — R200 Anesthesia of skin: Secondary | ICD-10-CM | POA: Insufficient documentation

## 2015-01-11 NOTE — Progress Notes (Signed)
*  PRELIMINARY RESULTS* Vascular Ultrasound Right lower extremity venous duplex has been completed.  Preliminary findings: No evidence of DVT or baker's cyst.  Called results to Amy at Dr. Arelia Sneddon office.    Landry Mellow, RDMS, RVT  01/11/2015, 3:25 PM

## 2015-01-16 ENCOUNTER — Inpatient Hospital Stay (HOSPITAL_COMMUNITY)
Admission: EM | Admit: 2015-01-16 | Discharge: 2015-01-21 | DRG: 637 | Disposition: A | Discharge status: Home Health | Payer: Medicare Other | Attending: Internal Medicine | Admitting: Internal Medicine

## 2015-01-16 ENCOUNTER — Encounter (HOSPITAL_COMMUNITY): Payer: Self-pay | Admitting: Family Medicine

## 2015-01-16 ENCOUNTER — Emergency Department (HOSPITAL_COMMUNITY): Payer: Medicare Other

## 2015-01-16 DIAGNOSIS — E11628 Type 2 diabetes mellitus with other skin complications: Secondary | ICD-10-CM | POA: Diagnosis not present

## 2015-01-16 DIAGNOSIS — E11621 Type 2 diabetes mellitus with foot ulcer: Secondary | ICD-10-CM | POA: Diagnosis present

## 2015-01-16 DIAGNOSIS — N183 Chronic kidney disease, stage 3 unspecified: Secondary | ICD-10-CM | POA: Diagnosis present

## 2015-01-16 DIAGNOSIS — I2510 Atherosclerotic heart disease of native coronary artery without angina pectoris: Secondary | ICD-10-CM | POA: Diagnosis present

## 2015-01-16 DIAGNOSIS — N189 Chronic kidney disease, unspecified: Secondary | ICD-10-CM | POA: Diagnosis not present

## 2015-01-16 DIAGNOSIS — M109 Gout, unspecified: Secondary | ICD-10-CM | POA: Diagnosis present

## 2015-01-16 DIAGNOSIS — J9811 Atelectasis: Secondary | ICD-10-CM | POA: Diagnosis present

## 2015-01-16 DIAGNOSIS — K219 Gastro-esophageal reflux disease without esophagitis: Secondary | ICD-10-CM | POA: Diagnosis present

## 2015-01-16 DIAGNOSIS — E7800 Pure hypercholesterolemia, unspecified: Secondary | ICD-10-CM | POA: Diagnosis present

## 2015-01-16 DIAGNOSIS — E785 Hyperlipidemia, unspecified: Secondary | ICD-10-CM | POA: Diagnosis present

## 2015-01-16 DIAGNOSIS — Z96651 Presence of right artificial knee joint: Secondary | ICD-10-CM | POA: Diagnosis present

## 2015-01-16 DIAGNOSIS — E1169 Type 2 diabetes mellitus with other specified complication: Secondary | ICD-10-CM

## 2015-01-16 DIAGNOSIS — Z8711 Personal history of peptic ulcer disease: Secondary | ICD-10-CM | POA: Diagnosis not present

## 2015-01-16 DIAGNOSIS — M869 Osteomyelitis, unspecified: Secondary | ICD-10-CM

## 2015-01-16 DIAGNOSIS — Z7982 Long term (current) use of aspirin: Secondary | ICD-10-CM

## 2015-01-16 DIAGNOSIS — L0390 Cellulitis, unspecified: Secondary | ICD-10-CM | POA: Diagnosis present

## 2015-01-16 DIAGNOSIS — J42 Unspecified chronic bronchitis: Secondary | ICD-10-CM | POA: Diagnosis present

## 2015-01-16 DIAGNOSIS — I119 Hypertensive heart disease without heart failure: Secondary | ICD-10-CM | POA: Diagnosis present

## 2015-01-16 DIAGNOSIS — R6510 Systemic inflammatory response syndrome (SIRS) of non-infectious origin without acute organ dysfunction: Secondary | ICD-10-CM | POA: Diagnosis not present

## 2015-01-16 DIAGNOSIS — I739 Peripheral vascular disease, unspecified: Secondary | ICD-10-CM | POA: Diagnosis present

## 2015-01-16 DIAGNOSIS — Z79899 Other long term (current) drug therapy: Secondary | ICD-10-CM

## 2015-01-16 DIAGNOSIS — Z7902 Long term (current) use of antithrombotics/antiplatelets: Secondary | ICD-10-CM

## 2015-01-16 DIAGNOSIS — N179 Acute kidney failure, unspecified: Secondary | ICD-10-CM | POA: Diagnosis present

## 2015-01-16 DIAGNOSIS — R0902 Hypoxemia: Secondary | ICD-10-CM

## 2015-01-16 DIAGNOSIS — E1142 Type 2 diabetes mellitus with diabetic polyneuropathy: Secondary | ICD-10-CM | POA: Diagnosis present

## 2015-01-16 DIAGNOSIS — Z7952 Long term (current) use of systemic steroids: Secondary | ICD-10-CM

## 2015-01-16 DIAGNOSIS — A419 Sepsis, unspecified organism: Secondary | ICD-10-CM | POA: Diagnosis present

## 2015-01-16 DIAGNOSIS — E1151 Type 2 diabetes mellitus with diabetic peripheral angiopathy without gangrene: Secondary | ICD-10-CM | POA: Diagnosis present

## 2015-01-16 DIAGNOSIS — I1310 Hypertensive heart and chronic kidney disease without heart failure, with stage 1 through stage 4 chronic kidney disease, or unspecified chronic kidney disease: Secondary | ICD-10-CM | POA: Diagnosis present

## 2015-01-16 DIAGNOSIS — L089 Local infection of the skin and subcutaneous tissue, unspecified: Secondary | ICD-10-CM

## 2015-01-16 DIAGNOSIS — L03115 Cellulitis of right lower limb: Secondary | ICD-10-CM | POA: Diagnosis present

## 2015-01-16 DIAGNOSIS — F17210 Nicotine dependence, cigarettes, uncomplicated: Secondary | ICD-10-CM | POA: Diagnosis present

## 2015-01-16 LAB — CBC WITH DIFFERENTIAL/PLATELET
BASOS ABS: 0 10*3/uL (ref 0.0–0.1)
Basophils Relative: 0 %
Eosinophils Absolute: 0.1 10*3/uL (ref 0.0–0.7)
Eosinophils Relative: 1 %
HEMATOCRIT: 32.9 % — AB (ref 39.0–52.0)
Hemoglobin: 10.7 g/dL — ABNORMAL LOW (ref 13.0–17.0)
LYMPHS PCT: 7 %
Lymphs Abs: 1.2 10*3/uL (ref 0.7–4.0)
MCH: 32.5 pg (ref 26.0–34.0)
MCHC: 32.5 g/dL (ref 30.0–36.0)
MCV: 100 fL (ref 78.0–100.0)
Monocytes Absolute: 0.6 10*3/uL (ref 0.1–1.0)
Monocytes Relative: 4 %
NEUTROS ABS: 15 10*3/uL — AB (ref 1.7–7.7)
Neutrophils Relative %: 88 %
Platelets: 221 10*3/uL (ref 150–400)
RBC: 3.29 MIL/uL — AB (ref 4.22–5.81)
RDW: 14.4 % (ref 11.5–15.5)
WBC: 16.9 10*3/uL — AB (ref 4.0–10.5)

## 2015-01-16 LAB — CBG MONITORING, ED: GLUCOSE-CAPILLARY: 239 mg/dL — AB (ref 65–99)

## 2015-01-16 LAB — COMPREHENSIVE METABOLIC PANEL
ALBUMIN: 3.3 g/dL — AB (ref 3.5–5.0)
ALT: 6 U/L — AB (ref 17–63)
AST: 12 U/L — AB (ref 15–41)
Alkaline Phosphatase: 83 U/L (ref 38–126)
Anion gap: 12 (ref 5–15)
BILIRUBIN TOTAL: 0.6 mg/dL (ref 0.3–1.2)
BUN: 72 mg/dL — ABNORMAL HIGH (ref 6–20)
CO2: 23 mmol/L (ref 22–32)
Calcium: 9.1 mg/dL (ref 8.9–10.3)
Chloride: 100 mmol/L — ABNORMAL LOW (ref 101–111)
Creatinine, Ser: 2.45 mg/dL — ABNORMAL HIGH (ref 0.61–1.24)
GFR calc Af Amer: 28 mL/min — ABNORMAL LOW (ref >60)
GFR calc non Af Amer: 24 mL/min — ABNORMAL LOW (ref >60)
Glucose, Bld: 287 mg/dL — ABNORMAL HIGH (ref 65–99)
POTASSIUM: 5 mmol/L (ref 3.5–5.1)
Sodium: 135 mmol/L (ref 135–145)
TOTAL PROTEIN: 6.9 g/dL (ref 6.5–8.1)

## 2015-01-16 LAB — I-STAT CG4 LACTIC ACID, ED
LACTIC ACID, VENOUS: 2.66 mmol/L — AB (ref 0.5–2.0)
Lactic Acid, Venous: 2.08 mmol/L (ref 0.5–2.0)

## 2015-01-16 LAB — SEDIMENTATION RATE: Sed Rate: 72 mm/hr — ABNORMAL HIGH (ref 0–16)

## 2015-01-16 LAB — GLUCOSE, CAPILLARY: Glucose-Capillary: 242 mg/dL — ABNORMAL HIGH (ref 65–99)

## 2015-01-16 LAB — C-REACTIVE PROTEIN: CRP: 18.9 mg/dL — ABNORMAL HIGH (ref <1.0)

## 2015-01-16 LAB — TSH: TSH: 1.239 u[IU]/mL (ref 0.350–4.500)

## 2015-01-16 MED ORDER — ONDANSETRON HCL 4 MG PO TABS: 4.0000 mg | ORAL_TABLET | Freq: Four times a day (QID) | ORAL | Status: DC | PRN

## 2015-01-16 MED ORDER — VANCOMYCIN HCL IN DEXTROSE 750-5 MG/150ML-% IV SOLN
750.0000 mg | INTRAVENOUS | Status: DC
  Administered 2015-01-17 – 2015-01-20 (×4): 750 mg via INTRAVENOUS
  Filled 2015-01-16 (×5): qty 150

## 2015-01-16 MED ORDER — VANCOMYCIN HCL IN DEXTROSE 1-5 GM/200ML-% IV SOLN
1000.0000 mg | Freq: Once | INTRAVENOUS | Status: AC
  Administered 2015-01-16: 1000 mg via INTRAVENOUS
  Filled 2015-01-16: qty 200

## 2015-01-16 MED ORDER — GLIMEPIRIDE 4 MG PO TABS
4.0000 mg | ORAL_TABLET | Freq: Two times a day (BID) | ORAL | Status: DC
  Administered 2015-01-16 – 2015-01-21 (×10): 4 mg via ORAL
  Filled 2015-01-16 (×12): qty 1

## 2015-01-16 MED ORDER — INSULIN ASPART 100 UNIT/ML ~~LOC~~ SOLN
0.0000 [IU] | Freq: Three times a day (TID) | SUBCUTANEOUS | Status: DC
  Administered 2015-01-16 – 2015-01-17 (×3): 5 [IU] via SUBCUTANEOUS
  Administered 2015-01-18 – 2015-01-19 (×3): 3 [IU] via SUBCUTANEOUS
  Administered 2015-01-19: 5 [IU] via SUBCUTANEOUS
  Administered 2015-01-20: 3 [IU] via SUBCUTANEOUS
  Administered 2015-01-20: 2 [IU] via SUBCUTANEOUS
  Administered 2015-01-21: 5 [IU] via SUBCUTANEOUS

## 2015-01-16 MED ORDER — ALUM & MAG HYDROXIDE-SIMETH 200-200-20 MG/5ML PO SUSP: 30.0000 mL | Freq: Four times a day (QID) | ORAL | Status: DC | PRN

## 2015-01-16 MED ORDER — PIPERACILLIN-TAZOBACTAM 3.375 G IVPB
3.3750 g | Freq: Three times a day (TID) | INTRAVENOUS | Status: DC
  Filled 2015-01-16 (×2): qty 50

## 2015-01-16 MED ORDER — PIPERACILLIN-TAZOBACTAM 3.375 G IVPB 30 MIN
3.3750 g | Freq: Once | INTRAVENOUS | Status: AC
  Administered 2015-01-16: 3.375 g via INTRAVENOUS
  Filled 2015-01-16: qty 50

## 2015-01-16 MED ORDER — HYDROMORPHONE HCL 1 MG/ML IJ SOLN
0.5000 mg | INTRAMUSCULAR | Status: DC | PRN
  Administered 2015-01-16 – 2015-01-21 (×11): 0.5 mg via INTRAVENOUS
  Filled 2015-01-16 (×12): qty 1

## 2015-01-16 MED ORDER — PRAVASTATIN SODIUM 20 MG PO TABS
10.0000 mg | ORAL_TABLET | Freq: Every day | ORAL | Status: DC
  Administered 2015-01-16 – 2015-01-20 (×5): 10 mg via ORAL
  Filled 2015-01-16 (×5): qty 1

## 2015-01-16 MED ORDER — ONDANSETRON HCL 4 MG/2ML IJ SOLN
4.0000 mg | Freq: Once | INTRAMUSCULAR | Status: AC
  Administered 2015-01-16: 4 mg via INTRAVENOUS
  Filled 2015-01-16: qty 2

## 2015-01-16 MED ORDER — SODIUM CHLORIDE 0.9 % IV SOLN
INTRAVENOUS | Status: DC
  Administered 2015-01-16: 500 mL via INTRAVENOUS

## 2015-01-16 MED ORDER — COLCHICINE 0.6 MG PO TABS: 0.6000 mg | ORAL_TABLET | Freq: Two times a day (BID) | ORAL | Status: DC

## 2015-01-16 MED ORDER — SODIUM CHLORIDE 0.9 % IV BOLUS (SEPSIS)
500.0000 mL | Freq: Once | INTRAVENOUS | Status: AC
  Administered 2015-01-16: 500 mL via INTRAVENOUS

## 2015-01-16 MED ORDER — ACETAMINOPHEN 325 MG PO TABS: 650.0000 mg | ORAL_TABLET | Freq: Four times a day (QID) | ORAL | Status: DC | PRN

## 2015-01-16 MED ORDER — GABAPENTIN 600 MG PO TABS
1200.0000 mg | ORAL_TABLET | Freq: Three times a day (TID) | ORAL | Status: DC
  Administered 2015-01-16 – 2015-01-18 (×6): 1200 mg via ORAL
  Filled 2015-01-16 (×6): qty 2

## 2015-01-16 MED ORDER — HYDROMORPHONE HCL 1 MG/ML IJ SOLN
0.5000 mg | Freq: Once | INTRAMUSCULAR | Status: AC
  Administered 2015-01-16: 0.5 mg via INTRAVENOUS
  Filled 2015-01-16: qty 1

## 2015-01-16 MED ORDER — INSULIN ASPART 100 UNIT/ML ~~LOC~~ SOLN: 0.0000 [IU] | Freq: Every day | SUBCUTANEOUS | Status: DC

## 2015-01-16 MED ORDER — SODIUM CHLORIDE 0.9 % IV SOLN
INTRAVENOUS | Status: DC
  Administered 2015-01-16 – 2015-01-17 (×2): via INTRAVENOUS
  Administered 2015-01-17: 1000 mL via INTRAVENOUS
  Administered 2015-01-17 – 2015-01-21 (×2): via INTRAVENOUS

## 2015-01-16 MED ORDER — ONDANSETRON HCL 4 MG/2ML IJ SOLN: 4.0000 mg | Freq: Four times a day (QID) | INTRAMUSCULAR | Status: DC | PRN

## 2015-01-16 MED ORDER — ACETAMINOPHEN 650 MG RE SUPP
650.0000 mg | Freq: Four times a day (QID) | RECTAL | Status: DC | PRN
Start: 2015-01-16 — End: 2015-01-21

## 2015-01-16 MED ORDER — COLCHICINE 0.6 MG PO TABS
1.2000 mg | ORAL_TABLET | Freq: Once | ORAL | Status: AC
  Administered 2015-01-16: 1.2 mg via ORAL
  Filled 2015-01-16: qty 2

## 2015-01-16 MED ORDER — CLOPIDOGREL BISULFATE 75 MG PO TABS
75.0000 mg | ORAL_TABLET | Freq: Every day | ORAL | Status: DC
  Administered 2015-01-16 – 2015-01-21 (×6): 75 mg via ORAL
  Filled 2015-01-16 (×6): qty 1

## 2015-01-16 MED ORDER — OMEPRAZOLE MAGNESIUM 20 MG PO TBEC: 40.0000 mg | DELAYED_RELEASE_TABLET | Freq: Every day | ORAL | Status: DC

## 2015-01-16 MED ORDER — PANTOPRAZOLE SODIUM 40 MG PO TBEC
40.0000 mg | DELAYED_RELEASE_TABLET | Freq: Every day | ORAL | Status: DC
  Administered 2015-01-16 – 2015-01-21 (×6): 40 mg via ORAL
  Filled 2015-01-16 (×6): qty 1

## 2015-01-16 NOTE — ED Notes (Addendum)
Pt here for right foot pain and infection that started last week. sts out of diabetes meds since Friday and BS has been high. Pt shaking and warm to touch. sts infection is in the bone of foot and radiating up.

## 2015-01-16 NOTE — ED Provider Notes (Signed)
CSN: [id number]     Arrival date & time 01/16/15  1207 History   First MD Initiated Contact with Patient 01/16/15 1329     Chief Complaint  Patient presents with  . Wound Infection  . Foot Pain     (Consider location/radiation/quality/duration/timing/severity/associated sxs/prior Treatment) HPI Comments: Patient with history of diabetes controlled on glimepiride, iliac artery stent on Plavix, gouty arthritis -- presents with complaint of right foot swelling, pain, redness and warmth starting about 10 days ago. At onset, patient and PCP were uncertain if this was gout versus blood clot versus cellulitis. Patient had an ultrasound last week which did not demonstrate blood clot. Patient was also given a trial of prednisone which did not help. Over the past 2 days pain and erythema have worsened. Patient has a new ulcer on the inside of his right foot. He has had subjective fever and shaking chills. Pain radiates up to his thigh. Patient states that he has been out of his omeprazole for several days. He checked his blood sugar at home and it was in the 500s. No nausea, vomiting, abdominal pain. Onset of symptoms acute. Course is worsening. Nothing makes symptoms better or worse.  Patient is a 74 y.o. male presenting with lower extremity pain. The history is provided by the patient and medical records.  Foot Pain Associated symptoms include chills, a fever and myalgias. Pertinent negatives include no abdominal pain, chest pain, coughing, headaches, nausea, rash, sore throat or vomiting.    Past Medical History  Diagnosis Date  . Hypertension   . Ulcer disease   . Colon polyp   . Gout   . Bradycardia   . Hypercholesteremia   . PAD (peripheral artery disease) ([place])   . Chronic bronchitis ([place])   . Type II diabetes mellitus ([place])   . History of GI bleed 2007  . Gout   . Tobacco abuse disorder 03/10/2012  . GERD (gastroesophageal reflux disease)   . Pneumonia     denies   Past Surgical  History  Procedure Laterality Date  . Splenectomy  ~ 1957  . Anterior cervical decomp/discectomy fusion  ~ 2007  . Upper endoscopy w/ sclerotherapy  ~ 2007  . Polypectomy    . Colonoscopy    . Iliac artery stent      left common/notes (02/04/2012)  . Hernia repair  ~ 2007    UHR (02/04/2012)  . Tonsillectomy  ~ 1947  . Lumbar laminectomy  11/14  . Total knee arthroplasty Right 02/03/2013    Procedure: TOTAL KNEE ARTHROPLASTY;  Surgeon: [name], MD;  Location: [place];  Service: Orthopedics;  Laterality: Right;  . Lower extremity angiogram N/A 02/04/2012    Procedure: LOWER EXTREMITY ANGIOGRAM;  Surgeon: Lorretta Harp, MD;  Location: [facility] C[facility] LAB;  Service: Cardiovascular;  Laterality: N/A;  . Percutaneous stent intervention Left 02/04/2012    Procedure: PERCUTANEOUS STENT INTERVENTION;  Surgeon: Lorretta Harp, MD;  Location: [facility] CATH LAB;  Service: Cardiovascular;  Laterality: Left;  lt ext iliac stent  . Replacement total knee Right   . Anterior fusion lumbar spine  01/20/2014  . Anterior lat lumbar fusion Left 01/19/2014    Procedure: LATERAL INTERBODY FUSION 1 LEVEL;  Surgeon: Sinclair Ship, MD;  Location: [place];  Service: Orthopedics;  Laterality: Left;  Left lumbar 3-4 lateral interbody fusion with instrumentation, allograft   Family History  Problem Relation Age of Onset  . Colon cancer Paternal Uncle     Uncle  .  Heart disease Father   . Hypertension Father   . Heart attack Father   . Heart disease Mother   . Diabetes Mother   . Hypertension Mother    Social History  Substance Use Topics  . Smoking status: Current Every Day Smoker -- 2.00 packs/day for 58 years    Types: Cigarettes  . Smokeless tobacco: Former Systems developer    Types: Chew  . Alcohol Use: 0.0 oz/week    0 Standard drinks or equivalent per week     Comment:  quit for 22 yr; egg nog christmas    Review of Systems  Constitutional: Positive for fever and chills.  HENT: Negative for rhinorrhea and sore  throat.   Eyes: Negative for redness.  Respiratory: Negative for cough.   Cardiovascular: Negative for chest pain.  Gastrointestinal: Negative for nausea, vomiting, abdominal pain and diarrhea.  Genitourinary: Negative for dysuria.  Musculoskeletal: Positive for myalgias.  Skin: Positive for color change. Negative for rash.  Neurological: Negative for headaches.    Allergies  Review of patient's allergies indicates no known allergies.  Home Medications   Prior to Admission medications   Medication Sig Start Date End Date Taking? Authorizing Provider  allopurinol (ZYLOPRIM) 300 MG tablet Take 300 mg by mouth daily.    Historical Provider, MD  clopidogrel (PLAVIX) 75 MG tablet Take 1 tablet by mouth daily. 11/22/13   Historical Provider, MD  furosemide (LASIX) 80 MG tablet Take 40 mg by mouth daily.  10/20/13   Historical Provider, MD  gabapentin (NEURONTIN) 600 MG tablet Take 1,200 mg by mouth every 8 (eight) hours.     Historical Provider, MD  glimepiride (AMARYL) 4 MG tablet Take 4 mg by mouth 2 (two) times daily.     Historical Provider, MD  lisinopril (PRINIVIL,ZESTRIL) 20 MG tablet Take 20 mg by mouth daily.      Historical Provider, MD  lovastatin (MEVACOR) 40 MG tablet Take 40 mg by mouth at bedtime.    Historical Provider, MD  omeprazole (PRILOSEC OTC) 20 MG tablet Take 40 mg by mouth daily.     Historical Provider, MD   BP 142/61 mmHg  Pulse 105  Temp(Src) 98.9 F (37.2 C) (Oral)  Resp 20  Wt 88.9 kg  SpO2 99%   Physical Exam  Constitutional: He appears well-developed and well-nourished.  HENT:  Head: Normocephalic and atraumatic.  Mouth/Throat: Oropharynx is clear and moist.  Eyes: Conjunctivae are normal. Right eye exhibits no discharge. Left eye exhibits no discharge.  Neck: Normal range of motion. Neck supple.  Cardiovascular: Normal rate, regular rhythm and normal heart sounds.   Pulses:      Dorsalis pedis pulses are 2+ on the right side, and 2+ on the left  side.  Pulmonary/Chest: Effort normal and breath sounds normal.  Abdominal: Soft. There is no tenderness.  Musculoskeletal: He exhibits tenderness.  Tenderness of the right foot and ankle.  Neurological: He is alert.  Skin: Skin is warm and dry.  Patient with erythema of the right foot and ankle. Warmth extends up to the knee. Trace edema. There is a 1 cm ulceration to the inner aspect of the right foot.  Psychiatric: He has a normal mood and affect.  Nursing note and vitals reviewed.   ED Course  Procedures (including critical care time) Labs Review Labs Reviewed  COMPREHENSIVE METABOLIC PANEL - Abnormal; Notable for the following:    Chloride 100 (*)    Glucose, Bld 287 (*)    BUN 72 (*)  Creatinine, Ser 2.45 (*)    Albumin 3.3 (*)    AST 12 (*)    ALT 6 (*)    GFR calc non Af Amer 24 (*)    GFR calc Af Amer 28 (*)    All other components within normal limits  CBC WITH DIFFERENTIAL/PLATELET - Abnormal; Notable for the following:    WBC 16.9 (*)    RBC 3.29 (*)    Hemoglobin 10.7 (*)    HCT 32.9 (*)    Neutro Abs 15.0 (*)    All other components within normal limits  CBG MONITORING, ED - Abnormal; Notable for the following:    Glucose-Capillary 239 (*)    All other components within normal limits  I-STAT CG4 LACTIC ACID, ED - Abnormal; Notable for the following:    Lactic Acid, Venous 2.08 (*)    All other components within normal limits  CULTURE, BLOOD (ROUTINE X 2)    Imaging Review Dg Foot Complete Right  01/16/2015  CLINICAL DATA:  Pain over the entire foot. EXAM: RIGHT FOOT COMPLETE - 3+ VIEW COMPARISON:  None. FINDINGS: There is generalized osteopenia. There is no acute fracture or dislocation. There is mild degenerative changes of the first MTP joint. There is a soft tissue ulcer overlying the medial aspect of the first MTP joint with soft tissue emphysema. No cortical destruction or periosteal reaction. IMPRESSION: Soft tissue ulcer along the medial aspect of  the first MTP joint with mild soft tissue emphysema. No evidence of osteomyelitis of the right foot. Electronically Signed   By: Kathreen Devoid   On: 01/16/2015 13:01   I have personally reviewed and evaluated these images and lab results as part of my medical decision-making.   EKG Interpretation None       1:43 PM Patient seen and examined. Work-up initiated. Medications ordered.   Vital signs reviewed and are as follows: BP 142/61 mmHg  Pulse 105  Temp(Src) 98.9 F (37.2 C) (Oral)  Resp 20  Wt 88.9 kg  SpO2 99%  2:03 PM Discussed with Dr. Alvino Chapel.   Spoke with [facility] NP who will see and admit.   MDM   Final diagnoses:  Type 2 diabetes mellitus with right diabetic foot infection ([place])  Acute on chronic kidney failure ([place])   Admit.    Carlisle Cater, PA-C 01/16/15 1403  Davonna Belling, MD 01/18/15 2224

## 2015-01-16 NOTE — ED Notes (Signed)
Admitting MD dr. Marily Memos paged. Spoke with PA karen to make aware of repeat lactic acid and current b/p 99/50.

## 2015-01-16 NOTE — Progress Notes (Signed)
Patient trasfered from ED to 5W10 via stretcher; alert and oriented x 4; complaints of pain in right foot; IV in LAC running @75cc /hr; skin was checked by RN floor and charge nurse - wound on left foot (Cellulitis). Orient patient to room and unit; gave patient care guide; instructed how to use the call bell and  fall risk precautions. Placed tele monitor box 14 and called Central tele.Will continue to monitor the patient.

## 2015-01-16 NOTE — Progress Notes (Addendum)
ANTIBIOTIC CONSULT NOTE - INITIAL  Pharmacy Consult for zosyn and Vancomycin Indication: cellulitis  No Known Allergies  Patient Measurements: Weight: 195 lb 15.8 oz (88.9 kg)  Vital Signs: Temp: 98.9 F (37.2 C) (12/19 1219) Temp Source: Oral (12/19 1219) BP: 142/61 mmHg (12/19 1219) Pulse Rate: 105 (12/19 1219) Intake/Output from previous day:   Intake/Output from this shift:    Labs:  Recent Labs  01/16/15 1227  WBC 16.9*  HGB 10.7*  PLT 221  CREATININE 2.45*   Estimated Creatinine Clearance: 29.4 mL/min (by C-G formula based on Cr of 2.45). No results for input(s): VANCOTROUGH, VANCOPEAK, VANCORANDOM, GENTTROUGH, GENTPEAK, GENTRANDOM, TOBRATROUGH, TOBRAPEAK, TOBRARND, AMIKACINPEAK, AMIKACINTROU, AMIKACIN in the last 72 hours.   Microbiology: No results found for this or any previous visit (from the past 720 hour(s)).  Medical History: Past Medical History  Diagnosis Date  . Hypertension   . Ulcer disease   . Colon polyp   . Gout   . Bradycardia   . Hypercholesteremia   . PAD (peripheral artery disease) ([place])   . Chronic bronchitis ([place])   . Type II diabetes mellitus ([place])   . History of GI bleed 2007  . Gout   . Tobacco abuse disorder 03/10/2012  . GERD (gastroesophageal reflux disease)   . Pneumonia     denies   Assessment: 74 yo here for r foot pain/infection x 1 week.   PMH: DM  ID: abx for cellulitis. WBC 16.9, Lactate 2.08, AF  Zosyn 12/19>>  Blood x 1  Renal: SCr 2.4  Plan:  Zosyn 3.375 gm IV q8h F/U LOT, De-escalation  Levester Fresh, PharmD, BCPS, [facility] Clinical Pharmacist Pager [phone] 01/16/2015 1:44 PM  ADDN: Pharmacy is consulted to dose vancomycin for cellulitis.   Plan: Vancomycin 1g IV once followed by 750mg  q24h VT at [facility]. Diona Foley, PharmD, [place] Clinical Pharmacist Pager [phone]

## 2015-01-16 NOTE — ED Notes (Signed)
Admitting at bedside 

## 2015-01-16 NOTE — H&P (Signed)
[facility] History and Physical  Marc [name] [id number] DOB: Nov 10, 1940 DOA: 01/16/2015  Referring physician:  PCP: [name], MD   Chief Complaint: Right foot pain  HPI: Marc [name] is a very pleasant 73 y.o. male S medical history of diabetes on oral agent, vascular disease status post iliac artery stent on Plavix, gout, hypertension, peptic ulcer disease with history GI bleed presents emergency Department chief complaint of worsening right foot pain swelling and erythema. Initial evaluation in the emergency department concerning for cellulitis complicated by gout flare.  Patient reports 1 week history of gout pain in his right foot. Reports PCP ordered prednisone. Which he he reports completing. 3 days ago he developed gradual worsening swelling erythema and pain to the right foot. Pain is described as sharp and tense and "spastic-like" located great toe joint. Associated symptoms include chills without subjective fever nausea without vomiting decreased oral intake. In addition he has a diabetic foot ulcer underneath 80 are aspect great toe. He denies chest pain palpitation abdominal pain dysuria hematuria frequency or urgency. He reports his last bowel movement yesterday normal in color and consistency. In the emergency department he is afebrile hemodynamically stable and not hypoxic. While in a lot of pain he does not appear toxic   Review of Systems:  .  10 point review of systems complete and all systems are negative except as indicated in the history of present illness Past Medical History  Diagnosis Date  . Hypertension   . Ulcer disease   . Colon polyp   . Gout   . Bradycardia   . Hypercholesteremia   . PAD (peripheral artery disease) ([place])   . Chronic bronchitis ([place])   . Type II diabetes mellitus ([place])   . History of GI bleed 2007  . Gout   . Tobacco abuse disorder 03/10/2012  . GERD (gastroesophageal reflux disease)   . Pneumonia    denies   Past Surgical History  Procedure Laterality Date  . Splenectomy  ~ 1957  . Anterior cervical decomp/discectomy fusion  ~ 2007  . Upper endoscopy w/ sclerotherapy  ~ 2007  . Polypectomy    . Colonoscopy    . Iliac artery stent      left common/notes (02/04/2012)  . Hernia repair  ~ 2007    UHR (02/04/2012)  . Tonsillectomy  ~ 1947  . Lumbar laminectomy  11/14  . Total knee arthroplasty Right 02/03/2013    Procedure: TOTAL KNEE ARTHROPLASTY;  Surgeon: [name], MD;  Location: [facility];  Service: Orthopedics;  Laterality: Right;  . Lower extremity angiogram N/A 02/04/2012    Procedure: LOWER EXTREMITY ANGIOGRAM;  Surgeon: Lorretta Harp, MD;  Location: [facility] CATH LAB;  Service: Cardiovascular;  Laterality: N/A;  . Percutaneous stent intervention Left 02/04/2012    Procedure: PERCUTANEOUS STENT INTERVENTION;  Surgeon: Lorretta Harp, MD;  Location: [facility] CATH LAB;  Service: Cardiovascular;  Laterality: Left;  lt ext iliac stent  . Replacement total knee Right   . Anterior fusion lumbar spine  01/20/2014  . Anterior lat lumbar fusion Left 01/19/2014    Procedure: LATERAL INTERBODY FUSION 1 LEVEL;  Surgeon: Sinclair Ship, MD;  Location: [facility];  Service: Orthopedics;  Laterality: Left;  Left lumbar 3-4 lateral interbody fusion with instrumentation, allograft   Social History:  reports that he has been smoking Cigarettes.  He has a 116 pack-year smoking history. He has quit using smokeless tobacco. His smokeless tobacco use included Chew. He reports that  he drinks alcohol. He reports that he does not use illicit drugs.  No Known Allergies  Family History  Problem Relation Age of Onset  . Colon cancer Paternal Uncle     Uncle  . Heart disease Father   . Hypertension Father   . Heart attack Father   . Heart disease Mother   . Diabetes Mother   . Hypertension Mother      Prior to Admission medications   Medication Sig Start Date End Date Taking? Authorizing Provider    allopurinol (ZYLOPRIM) 300 MG tablet Take 300 mg by mouth daily.   Yes Historical Provider, MD  aspirin EC 81 MG tablet Take 81 mg by mouth daily.   Yes Historical Provider, MD  clopidogrel (PLAVIX) 75 MG tablet Take 1 tablet by mouth daily. 11/22/13  Yes Historical Provider, MD  furosemide (LASIX) 80 MG tablet Take 40 mg by mouth daily.  10/20/13  Yes Historical Provider, MD  gabapentin (NEURONTIN) 600 MG tablet Take 1,200 mg by mouth every 8 (eight) hours.    Yes Historical Provider, MD  glimepiride (AMARYL) 4 MG tablet Take 4 mg by mouth 2 (two) times daily.    Yes Historical Provider, MD  indomethacin (INDOCIN) 25 MG capsule Take 25 mg by mouth 2 (two) times daily with a meal.   Yes Historical Provider, MD  lisinopril (PRINIVIL,ZESTRIL) 20 MG tablet Take 20 mg by mouth daily.     Yes Historical Provider, MD  lovastatin (MEVACOR) 40 MG tablet Take 40 mg by mouth at bedtime.   Yes Historical Provider, MD  omeprazole (PRILOSEC OTC) 20 MG tablet Take 40 mg by mouth daily.    Yes Historical Provider, MD  predniSONE (DELTASONE) 10 MG tablet Take 10 mg by mouth daily with breakfast.   Yes Historical Provider, MD   Physical Exam: Filed Vitals:   01/16/15 1345 01/16/15 1400 01/16/15 1430 01/16/15 1445  BP: 109/47 110/51 99/50 119/71  Pulse: 98 97 99 102  Temp:      TempSrc:      Resp: 16 12 16 17   Weight:      SpO2: 93% 96% 91% 92%    Wt Readings from Last 3 Encounters:  01/16/15 88.9 kg (195 lb 15.8 oz)  12/13/14 88.905 kg (196 lb)  08/17/14 88.996 kg (196 lb 3.2 oz)    General:  Appears moderately uncomfortable, mostly cooperative nontoxic appearing Eyes: PERRL, normal lids, irises & conjunctiva ENT: grossly normal hearing, lips & tongue Neck: no LAD, masses or thyromegaly Cardiovascular: Regular rate and rhythm, no m/r/g. Trace lower extremity edema on left right foot with erythema swelling extending to just at ankle mild heat very tender to touch. Telemetry: SR, no arrhythmias   Respiratory: CTA bilaterally, no w/r/r. Normal respiratory effort. Abdomen: soft, ntnd positive bowel sounds but sluggish Skin: no rash or induration seen on limited exam Musculoskeletal: grossly normal tone BUE/BLE Psychiatric: grossly normal mood and affect, speech fluent and appropriate Neurologic: grossly non-focal. Speech clear facial symmetry           Labs on Admission:  Basic Metabolic Panel:  Recent Labs Lab 01/16/15 1227  NA 135  K 5.0  CL 100*  CO2 23  GLUCOSE 287*  BUN 72*  CREATININE 2.45*  CALCIUM 9.1   Liver Function Tests:  Recent Labs Lab 01/16/15 1227  AST 12*  ALT 6*  ALKPHOS 83  BILITOT 0.6  PROT 6.9  ALBUMIN 3.3*   No results for input(s): LIPASE, AMYLASE in the last  168 hours. No results for input(s): AMMONIA in the last 168 hours. CBC:  Recent Labs Lab 01/16/15 1227  WBC 16.9*  NEUTROABS 15.0*  HGB 10.7*  HCT 32.9*  MCV 100.0  PLT 221   Cardiac Enzymes: No results for input(s): CKTOTAL, CKMB, CKMBINDEX, TROPONINI in the last 168 hours.  BNP (last 3 results) No results for input(s): BNP in the last 8760 hours.  ProBNP (last 3 results) No results for input(s): PROBNP in the last 8760 hours.  CBG:  Recent Labs Lab 01/16/15 1215  GLUCAP 239*    Radiological Exams on Admission: Dg Foot Complete Right  01/16/2015  CLINICAL DATA:  Pain over the entire foot. EXAM: RIGHT FOOT COMPLETE - 3+ VIEW COMPARISON:  None. FINDINGS: There is generalized osteopenia. There is no acute fracture or dislocation. There is mild degenerative changes of the first MTP joint. There is a soft tissue ulcer overlying the medial aspect of the first MTP joint with soft tissue emphysema. No cortical destruction or periosteal reaction. IMPRESSION: Soft tissue ulcer along the medial aspect of the first MTP joint with mild soft tissue emphysema. No evidence of osteomyelitis of the right foot. Electronically Signed   By: Kathreen Devoid   On: 01/16/2015 13:01     EKG: Independently pending  Assessment/Plan Principal Problem:   Cellulitis Active Problems:   Hypertensive heart disease   Type 2 diabetes mellitus with peripheral neuropathy (HCC)   Gout   Hyperlipidemia   GERD (gastroesophageal reflux disease)   CAD (coronary artery disease), native coronary artery   Acute renal failure superimposed on stage 3 chronic kidney disease (HCC)   SIRS (systemic inflammatory response syndrome) ([place])  #1. Cellulitis of the right foot in the setting of gout flare and diabetic foot ulcer. X-ray of foot with soft tissue ulcer no evidence of osteomyelitis -Admit to telemetry -Vancomycin and Zosyn per pharmacy -Wound consult -no indictation for MRI at admission -OP dopplar neg dvt  #2.SIRS -Afebrile with leukocytosis mildely elevated lactic, mild tachycardia acid and source -Lactic acid trending up however patient did not receive fluid bolus in the ED -We will provide fluid resuscitation, trend his lactic acid monitor on telemetry and a biotics as above -If his lactic acid continues to trend upward in spite of fluid resuscitation will call critical care  #3. Diabetes. On oral agent. Related on admission likely related to recent prednisone use -We use sliding scale insulin -Hg A1c  #4. Acute renal failure superimposed on chronic stage III. Baseline creatinine appears to be 1.30-1.4. He had an on admission is 2.45 likely related to above. -We'll hold any nephrotoxins -Fluid resuscitation as noted above -Monitor urine output -Recheck in the morning  #5. CAD. No chest pain.  -Monitor on telemetry  6. Hypertension. Somewhat soft in the emergency department -Fluids as noted above. -Will hold his home Lasix and his home lisinopril -Monitor  #7. Gout. Continues with flare. -I believe his current pain is mostly related to gout -Will load with colchicine and provide daily dosage -Hold his home allopurinol -Continue gabapentin -Continue his  PPI  #8 PA D. Status post iliac artery stent and is on Plavix -Outpatient Doppler negative for DVT -continue home meds   Code Status: full) DVT Prophylaxis: Family Communication: none present Disposition Plan: home when ready  Time spent: 59 minutes  [facility]

## 2015-01-16 NOTE — ED Notes (Signed)
PA states will change level of care to telemetry

## 2015-01-17 ENCOUNTER — Inpatient Hospital Stay (HOSPITAL_COMMUNITY): Payer: Medicare Other

## 2015-01-17 ENCOUNTER — Encounter (HOSPITAL_COMMUNITY): Payer: Self-pay | Admitting: General Practice

## 2015-01-17 LAB — URINALYSIS, ROUTINE W REFLEX MICROSCOPIC
Bilirubin Urine: NEGATIVE
Glucose, UA: NEGATIVE mg/dL
Hgb urine dipstick: NEGATIVE
KETONES UR: NEGATIVE mg/dL
LEUKOCYTES UA: NEGATIVE
NITRITE: NEGATIVE
PH: 5 (ref 5.0–8.0)
PROTEIN: NEGATIVE mg/dL
Specific Gravity, Urine: 1.013 (ref 1.005–1.030)

## 2015-01-17 LAB — CBC WITH DIFFERENTIAL/PLATELET
BASOS ABS: 0 10*3/uL (ref 0.0–0.1)
BASOS PCT: 0 %
EOS ABS: 0.2 10*3/uL (ref 0.0–0.7)
Eosinophils Relative: 1 %
HCT: 26.5 % — ABNORMAL LOW (ref 39.0–52.0)
HEMOGLOBIN: 8.6 g/dL — AB (ref 13.0–17.0)
LYMPHS ABS: 1.7 10*3/uL (ref 0.7–4.0)
Lymphocytes Relative: 14 %
MCH: 33.2 pg (ref 26.0–34.0)
MCHC: 32.5 g/dL (ref 30.0–36.0)
MCV: 102.3 fL — ABNORMAL HIGH (ref 78.0–100.0)
Monocytes Absolute: 0.5 10*3/uL (ref 0.1–1.0)
Monocytes Relative: 4 %
NEUTROS PCT: 81 %
Neutro Abs: 9.4 10*3/uL — ABNORMAL HIGH (ref 1.7–7.7)
Platelets: 180 10*3/uL (ref 150–400)
RBC: 2.59 MIL/uL — AB (ref 4.22–5.81)
RDW: 14.6 % (ref 11.5–15.5)
WBC: 11.8 10*3/uL — AB (ref 4.0–10.5)

## 2015-01-17 LAB — BASIC METABOLIC PANEL
ANION GAP: 7 (ref 5–15)
Anion gap: 7 (ref 5–15)
BUN: 74 mg/dL — ABNORMAL HIGH (ref 6–20)
BUN: 66 mg/dL — ABNORMAL HIGH (ref 6–20)
CHLORIDE: 107 mmol/L (ref 101–111)
CHLORIDE: 106 mmol/L (ref 101–111)
CO2: 24 mmol/L (ref 22–32)
CO2: 25 mmol/L (ref 22–32)
Calcium: 8.4 mg/dL — ABNORMAL LOW (ref 8.9–10.3)
Calcium: 8.1 mg/dL — ABNORMAL LOW (ref 8.9–10.3)
Creatinine, Ser: 2.64 mg/dL — ABNORMAL HIGH (ref 0.61–1.24)
Creatinine, Ser: 2.54 mg/dL — ABNORMAL HIGH (ref 0.61–1.24)
GFR calc Af Amer: 26 mL/min — ABNORMAL LOW (ref >60)
GFR calc non Af Amer: 23 mL/min — ABNORMAL LOW (ref >60)
GFR, EST AFRICAN AMERICAN: 27 mL/min — AB (ref >60)
GFR, EST NON AFRICAN AMERICAN: 22 mL/min — AB (ref >60)
GLUCOSE: 95 mg/dL (ref 65–99)
Glucose, Bld: 180 mg/dL — ABNORMAL HIGH (ref 65–99)
POTASSIUM: 4.8 mmol/L (ref 3.5–5.1)
POTASSIUM: 5 mmol/L (ref 3.5–5.1)
SODIUM: 138 mmol/L (ref 135–145)
Sodium: 138 mmol/L (ref 135–145)

## 2015-01-17 LAB — CBC
HEMATOCRIT: 27 % — AB (ref 39.0–52.0)
HEMOGLOBIN: 8.7 g/dL — AB (ref 13.0–17.0)
MCH: 32.6 pg (ref 26.0–34.0)
MCHC: 32.2 g/dL (ref 30.0–36.0)
MCV: 101.1 fL — ABNORMAL HIGH (ref 78.0–100.0)
Platelets: 187 10*3/uL (ref 150–400)
RBC: 2.67 MIL/uL — ABNORMAL LOW (ref 4.22–5.81)
RDW: 14.7 % (ref 11.5–15.5)
WBC: 12.7 10*3/uL — ABNORMAL HIGH (ref 4.0–10.5)

## 2015-01-17 LAB — GLUCOSE, CAPILLARY
GLUCOSE-CAPILLARY: 246 mg/dL — AB (ref 65–99)
GLUCOSE-CAPILLARY: 163 mg/dL — AB (ref 65–99)
Glucose-Capillary: 110 mg/dL — ABNORMAL HIGH (ref 65–99)
Glucose-Capillary: 75 mg/dL (ref 65–99)
Glucose-Capillary: 207 mg/dL — ABNORMAL HIGH (ref 65–99)

## 2015-01-17 LAB — LACTIC ACID, PLASMA
LACTIC ACID, VENOUS: 0.6 mmol/L (ref 0.5–2.0)
LACTIC ACID, VENOUS: 0.7 mmol/L (ref 0.5–2.0)

## 2015-01-17 LAB — HEMOGLOBIN A1C
HEMOGLOBIN A1C: 8 % — AB (ref 4.8–5.6)
Mean Plasma Glucose: 183 mg/dL

## 2015-01-17 MED ORDER — OXYCODONE HCL 5 MG PO TABS
5.0000 mg | ORAL_TABLET | Freq: Once | ORAL | Status: AC
  Administered 2015-01-17: 5 mg via ORAL
  Filled 2015-01-17: qty 1

## 2015-01-17 MED ORDER — COLCHICINE 0.6 MG PO TABS
0.6000 mg | ORAL_TABLET | Freq: Every day | ORAL | Status: DC
  Administered 2015-01-17 – 2015-01-21 (×5): 0.6 mg via ORAL
  Filled 2015-01-17 (×5): qty 1

## 2015-01-17 MED ORDER — SODIUM CHLORIDE 0.9 % IV BOLUS (SEPSIS)
1000.0000 mL | Freq: Once | INTRAVENOUS | Status: AC
  Administered 2015-01-17: 1000 mL via INTRAVENOUS

## 2015-01-17 MED ORDER — OXYCODONE-ACETAMINOPHEN 5-325 MG PO TABS
1.0000 | ORAL_TABLET | ORAL | Status: DC | PRN
  Administered 2015-01-17 – 2015-01-20 (×7): 1 via ORAL
  Filled 2015-01-17 (×8): qty 1

## 2015-01-17 MED ORDER — PIPERACILLIN-TAZOBACTAM 3.375 G IVPB
3.3750 g | Freq: Three times a day (TID) | INTRAVENOUS | Status: DC
  Administered 2015-01-17 – 2015-01-21 (×14): 3.375 g via INTRAVENOUS
  Filled 2015-01-17 (×16): qty 50

## 2015-01-17 MED ORDER — SODIUM CHLORIDE 0.9 % IV BOLUS (SEPSIS)
500.0000 mL | Freq: Once | INTRAVENOUS | Status: AC
  Administered 2015-01-17: 500 mL via INTRAVENOUS

## 2015-01-17 MED ORDER — COLLAGENASE 250 UNIT/GM EX OINT
TOPICAL_OINTMENT | Freq: Every day | CUTANEOUS | Status: DC
  Administered 2015-01-17: 1 via TOPICAL
  Administered 2015-01-18 – 2015-01-21 (×4): via TOPICAL
  Filled 2015-01-17: qty 30

## 2015-01-17 NOTE — Progress Notes (Signed)
Pt's lactic acid was 2.66 at 1413 on 01/16/15. On-call provider Tylene Fantasia, NP notified and asked if she would like to repeat lactic acid level. Awaiting orders.

## 2015-01-17 NOTE — Progress Notes (Signed)
Inpatient Diabetes Program Recommendations  AACE/ADA: New Consensus Statement on Inpatient Glycemic Control (2015)  Target Ranges:  Prepandial:   less than 140 mg/dL      Peak postprandial:   less than 180 mg/dL (1-2 hours)      Critically ill patients:  140 - 180 mg/dL   Review of Glycemic Control  Diabetes history: DM 2 Outpatient Diabetes medications: Amaryl 4 mg bid Current orders for Inpatient glycemic control: Moderate correction tidwc and amaryl 4 mg bid  Inpatient Diabetes Program Recommendations:    Glucose high on admission, but correction is normalizing levels. May want to discontinue the amaryl though as po intake may be variable. Patient down to 75 mg/dL fasting after only one dose amaryl 4 mg yesterday afternoon. May also want to lower correction scale to sensitive tidwc as BUN/Creatinine are elevated-(? Renal issues at this time).  Thank you Rosita Kea, RN, MSN, CDE  Diabetes Inpatient Program Office: [phone] Pager: [phone] 8:00 am to 5:00 pm

## 2015-01-17 NOTE — Consult Note (Addendum)
WOC wound consult note Reason for Consult: Consult requested for right foot wounds.   Wound type: Right great toe with full thickness wound; 100% eschar; 1X1cm, no odor or drainage or fluctuance, surrounded by generalized edema and erythremia. Right plantar foot with full thickness wound; 2.5X3cm, 100% eschar; no odor or drainage or fluctuance, surrounded by dark purple-reddish macerated skin. Entire right foot with generalized edema and erythremia. Dressing procedure/placement/frequency: Recommend ortho consult for possible debridement of nonviable tissue; enzymatic debridement would be a slower alternative.  Discussed plan of care with primary team via phone. Please re-consult if further assistance is needed.  Thank-you,  Julien Girt MSN, [place], [place], [place], [place]

## 2015-01-17 NOTE — Care Management Note (Signed)
Case Management Note  Patient Details  Name: Marc [name] MRN: [id number] Date of Birth: 1941/01/25  Subjective/Objective:                  Date-01-17-15 Initial Assessment Spoke with patient at the bedside.  Introduced self as Tourist information centre manager and explained role in discharge planning and how to be reached.  Verified patient lives at home alone in [place].  Verified patient anticipates to go home alone, at time of discharge.  Patient has DME cane walker. Expressed potential need for potential oxygen, patient currently wearing, but does not use it at home.  Patient denied  needing help with their medication.  Patient drives to MD appointments.  Verified patient has PCP Arelia Sneddon. Patient states they currently receive [facility] services through no one.  Patient was provided choice and selected [facility] for home health needs if they arise.  Plan: CM will continue to follow for discharge planning and [facility] resources.   Carles Collet RN BSN CM [phone]   Action/Plan:   Expected Discharge Date:                  Expected Discharge Plan:  [place]  In-House Referral:     Discharge planning Services  CM Consult  Post Acute Care Choice:    Choice offered to:     DME Arranged:    DME Agency:     HH Arranged:    HH Agency:  [facility]  Status of Service:  In process, will continue to follow  Medicare Important Message Given:    Date Medicare IM Given:    Medicare IM give by:    Date Additional Medicare IM Given:    Additional Medicare Important Message give by:     If discussed at [place] of Stay Meetings, dates discussed:    Additional Comments:  Carles Collet, RN 01/17/2015, 2:32 PM

## 2015-01-17 NOTE — Consult Note (Addendum)
Reason for Consult: Diabetic foot ulcer history of peripheral artery disease Referring Physician: [name] is an 74 y.o. male.  HPI:  Patient reports 1 week history of gout pain in his right foot. Reports PCP ordered prednisone. Which he he reports completing. 3 days ago he developed gradual worsening swelling erythema and pain to the right foot. Pain is described as sharp and tense and "spastic-like" located great toe joint. Associated symptoms include chills without subjective fever nausea without vomiting decreased oral intake. In addition he has a diabetic foot ulcer at the tip of the left great toe 1 cm in diameter that is chronic. When he was admitted to the hospital he was noted to have a 2 x 3 cm area of eschar over the medial aspect of the MTP joint he denies any specific injury, he lives alone and there is no one to check his foot on a daily basis. He has a iliac stent and a history of significant peripheral vascular disease.  Past Medical History  Diagnosis Date  . Hypertension   . Ulcer disease   . Colon polyp   . Gout   . Bradycardia   . Hypercholesteremia   . PAD (peripheral artery disease) ([place])   . Chronic bronchitis ([place])   . Type II diabetes mellitus ([place])   . History of GI bleed 2007  . Gout   . Tobacco abuse disorder 03/10/2012  . GERD (gastroesophageal reflux disease)   . Pneumonia     denies  . ARF (acute renal failure) ([place]) 12/2014    Past Surgical History  Procedure Laterality Date  . Splenectomy  ~ 1957  . Anterior cervical decomp/discectomy fusion  ~ 2007  . Upper endoscopy w/ sclerotherapy  ~ 2007  . Polypectomy    . Colonoscopy    . Iliac artery stent Left     left common/notes (02/04/2012)  . Hernia repair  ~ 2007    UHR (02/04/2012)  . Tonsillectomy  ~ 1947  . Lumbar laminectomy  11/14  . Total knee arthroplasty Right 02/03/2013    Procedure: TOTAL KNEE ARTHROPLASTY;  Surgeon: [name], MD;  Location: [facility];  Service: Orthopedics;   Laterality: Right;  . Lower extremity angiogram N/A 02/04/2012    Procedure: LOWER EXTREMITY ANGIOGRAM;  Surgeon: Lorretta Harp, MD;  Location: [facility] CATH LAB;  Service: Cardiovascular;  Laterality: N/A;  . Percutaneous stent intervention Left 02/04/2012    Procedure: PERCUTANEOUS STENT INTERVENTION;  Surgeon: Lorretta Harp, MD;  Location: [facility] CATH LAB;  Service: Cardiovascular;  Laterality: Left;  lt ext iliac stent  . Replacement total knee Right   . Back surgery    . Anterior lat lumbar fusion Left 01/19/2014    Procedure: LATERAL INTERBODY FUSION 1 LEVEL;  Surgeon: Sinclair Ship, MD;  Location: [facility];  Service: Orthopedics;  Laterality: Left;  Left lumbar 3-4 lateral interbody fusion with instrumentation, allograft    Family History  Problem Relation Age of Onset  . Colon cancer Paternal Uncle     Uncle  . Heart disease Father   . Hypertension Father   . Heart attack Father   . Heart disease Mother   . Diabetes Mother   . Hypertension Mother     Social History:  reports that he has been smoking Cigarettes.  He has a 116 pack-year smoking history. He has quit using smokeless tobacco. His smokeless tobacco use included Chew. He reports that he drinks alcohol. He reports that  he does not use illicit drugs.  Allergies: No Known Allergies  Medications: I have reviewed the patient's current medications.  Results for orders placed or performed during the hospital encounter of 01/16/15 (from the past 48 hour(s))  CBG monitoring, ED     Status: Abnormal   Collection Time: 01/16/15 12:15 PM  Result Value Ref Range   Glucose-Capillary 239 (H) 65 - 99 mg/dL   Comment 1 Notify RN   Comprehensive metabolic panel     Status: Abnormal   Collection Time: 01/16/15 12:27 PM  Result Value Ref Range   Sodium 135 135 - 145 mmol/L   Potassium 5.0 3.5 - 5.1 mmol/L   Chloride 100 (L) 101 - 111 mmol/L   CO2 23 22 - 32 mmol/L   Glucose, Bld 287 (H) 65 - 99 mg/dL   BUN 72 (H) 6 - 20 mg/dL    Creatinine, Ser 2.45 (H) 0.61 - 1.24 mg/dL   Calcium 9.1 8.9 - 10.3 mg/dL   Total Protein 6.9 6.5 - 8.1 g/dL   Albumin 3.3 (L) 3.5 - 5.0 g/dL   AST 12 (L) 15 - 41 U/L   ALT 6 (L) 17 - 63 U/L   Alkaline Phosphatase 83 38 - 126 U/L   Total Bilirubin 0.6 0.3 - 1.2 mg/dL   GFR calc non Af Amer 24 (L) >60 mL/min   GFR calc Af Amer 28 (L) >60 mL/min    Comment: (NOTE) The eGFR has been calculated using the CKD EPI equation. This calculation has not been validated in all clinical situations. eGFR's persistently <60 mL/min signify possible Chronic Kidney Disease.    Anion gap 12 5 - 15  CBC with Differential     Status: Abnormal   Collection Time: 01/16/15 12:27 PM  Result Value Ref Range   WBC 16.9 (H) 4.0 - 10.5 K/uL   RBC 3.29 (L) 4.22 - 5.81 MIL/uL   Hemoglobin 10.7 (L) 13.0 - 17.0 g/dL   HCT 32.9 (L) 39.0 - 52.0 %   MCV 100.0 78.0 - 100.0 fL   MCH 32.5 26.0 - 34.0 pg   MCHC 32.5 30.0 - 36.0 g/dL   RDW 14.4 11.5 - 15.5 %   Platelets 221 150 - 400 K/uL   Neutrophils Relative % 88 %   Neutro Abs 15.0 (H) 1.7 - 7.7 K/uL   Lymphocytes Relative 7 %   Lymphs Abs 1.2 0.7 - 4.0 K/uL   Monocytes Relative 4 %   Monocytes Absolute 0.6 0.1 - 1.0 K/uL   Eosinophils Relative 1 %   Eosinophils Absolute 0.1 0.0 - 0.7 K/uL   Basophils Relative 0 %   Basophils Absolute 0.0 0.0 - 0.1 K/uL  I-Stat CG4 Lactic Acid, ED (Not at [facility])     Status: Abnormal   Collection Time: 01/16/15 12:49 PM  Result Value Ref Range   Lactic Acid, Venous 2.08 (HH) 0.5 - 2.0 mmol/L   Comment NOTIFIED PHYSICIAN   I-Stat CG4 Lactic Acid, ED     Status: Abnormal   Collection Time: 01/16/15  2:13 PM  Result Value Ref Range   Lactic Acid, Venous 2.66 (HH) 0.5 - 2.0 mmol/L   Comment NOTIFIED PHYSICIAN   Glucose, capillary     Status: Abnormal   Collection Time: 01/16/15  4:53 PM  Result Value Ref Range   Glucose-Capillary 242 (H) 65 - 99 mg/dL  TSH     Status: None   Collection Time: 01/16/15  5:25 PM  Result  Value Ref  Range   TSH 1.239 0.350 - 4.500 uIU/mL  Hemoglobin A1c     Status: Abnormal   Collection Time: 01/16/15  5:25 PM  Result Value Ref Range   Hgb A1c MFr Bld 8.0 (H) 4.8 - 5.6 %    Comment: (NOTE)         Pre-diabetes: 5.7 - 6.4         Diabetes: >6.4         Glycemic control for adults with diabetes: <7.0    Mean Plasma Glucose 183 mg/dL    Comment: (NOTE) Performed At: [facility] [place], [place] [id number] Lindon Romp MD KG:[id number]   Sedimentation rate     Status: Abnormal   Collection Time: 01/16/15  5:25 PM  Result Value Ref Range   Sed Rate 72 (H) 0 - 16 mm/hr  C-reactive protein     Status: Abnormal   Collection Time: 01/16/15  5:25 PM  Result Value Ref Range   CRP 18.9 (H) <1.0 mg/dL  Glucose, capillary     Status: Abnormal   Collection Time: 01/16/15 11:03 PM  Result Value Ref Range   Glucose-Capillary 110 (H) 65 - 99 mg/dL  Basic metabolic panel     Status: Abnormal   Collection Time: 01/17/15  4:52 AM  Result Value Ref Range   Sodium 138 135 - 145 mmol/L   Potassium 4.8 3.5 - 5.1 mmol/L   Chloride 107 101 - 111 mmol/L   CO2 24 22 - 32 mmol/L   Glucose, Bld 95 65 - 99 mg/dL   BUN 74 (H) 6 - 20 mg/dL   Creatinine, Ser 2.64 (H) 0.61 - 1.24 mg/dL   Calcium 8.4 (L) 8.9 - 10.3 mg/dL   GFR calc non Af Amer 22 (L) >60 mL/min   GFR calc Af Amer 26 (L) >60 mL/min    Comment: (NOTE) The eGFR has been calculated using the CKD EPI equation. This calculation has not been validated in all clinical situations. eGFR's persistently <60 mL/min signify possible Chronic Kidney Disease.    Anion gap 7 5 - 15  CBC     Status: Abnormal   Collection Time: 01/17/15  4:52 AM  Result Value Ref Range   WBC 12.7 (H) 4.0 - 10.5 K/uL   RBC 2.67 (L) 4.22 - 5.81 MIL/uL   Hemoglobin 8.7 (L) 13.0 - 17.0 g/dL   HCT 27.0 (L) 39.0 - 52.0 %   MCV 101.1 (H) 78.0 - 100.0 fL   MCH 32.6 26.0 - 34.0 pg   MCHC 32.2 30.0 - 36.0 g/dL   RDW 14.7 11.5 -  15.5 %   Platelets 187 150 - 400 K/uL  Lactic acid, plasma     Status: None   Collection Time: 01/17/15  4:52 AM  Result Value Ref Range   Lactic Acid, Venous 0.6 0.5 - 2.0 mmol/L  Glucose, capillary     Status: None   Collection Time: 01/17/15  7:56 AM  Result Value Ref Range   Glucose-Capillary 75 65 - 99 mg/dL  Urinalysis, Routine w reflex microscopic (not at [facility])     Status: None   Collection Time: 01/17/15 10:00 AM  Result Value Ref Range   Color, Urine YELLOW YELLOW   APPearance CLEAR CLEAR   Specific Gravity, Urine 1.013 1.005 - 1.030   pH 5.0 5.0 - 8.0   Glucose, UA NEGATIVE NEGATIVE mg/dL   Hgb urine dipstick NEGATIVE NEGATIVE   Bilirubin Urine NEGATIVE NEGATIVE  Ketones, ur NEGATIVE NEGATIVE mg/dL   Protein, ur NEGATIVE NEGATIVE mg/dL   Nitrite NEGATIVE NEGATIVE   Leukocytes, UA NEGATIVE NEGATIVE    Comment: MICROSCOPIC NOT DONE ON URINES WITH NEGATIVE PROTEIN, BLOOD, LEUKOCYTES, NITRITE, OR GLUCOSE <1000 mg/dL.  Glucose, capillary     Status: Abnormal   Collection Time: 01/17/15 12:33 PM  Result Value Ref Range   Glucose-Capillary 207 (H) 65 - 99 mg/dL    US Renal  01/17/2015  CLINICAL DATA:  Acute renal failure EXAM: RENAL / URINARY TRACT ULTRASOUND COMPLETE COMPARISON:  04/14/2014 FINDINGS: Right Kidney: Length: 11.8 cm. Normal echogenicity. No hydronephrosis. No renal calculus. Again noted a lower pole cyst measures 1.4 x 1.2 cm. On the prior exam measures 1.5 x 1 cm. Left Kidney: Length: 11.3 cm length. Echogenicity within normal limits. No mass or hydronephrosis visualized. Bladder: Appears normal for degree of bladder distention. Prostate gland measures 4.2 x 3.8 x 5.3 cm with mild indentation of urinary bladder base. IMPRESSION: 1. No hydronephrosis.  No renal calculi. 2. Stable right renal lower pole cyst measures 1.4 x 1.2 cm. 3. Mild enlarged prostate gland with indentation of urinary bladder base measures 5.3 x 4.2 cm. Electronically Signed   By: Lahoma Crocker  M.D.   On: 01/17/2015 11:04   Mr Foot Right Wo Contrast  01/17/2015  CLINICAL DATA:  Progressive cellulitis with pain and redness and swelling of the right foot. Diabetic foot ulcer at the plantar aspect of the great toe. Gout. EXAM: MRI OF THE RIGHT FOREFOOT WITHOUT CONTRAST TECHNIQUE: Multiplanar, multisequence MR imaging was performed. No intravenous contrast was administered. COMPARISON:  Radiographs dated 01/16/2015 FINDINGS: There is subtle edema and slight irregularity of the medial sesamoid adjacent to the head of the first metatarsal immediately deep to the plantar ulcer. The other bones of the forefoot appear normal. There is no abscess. There is prominent subcutaneous edema on the dorsum of the foot. The tendons and muscles demonstrate no significant abnormalities. No joint effusions. IMPRESSION: Edema and irregularity of the medial sesamoid at the head of the first metatarsal just deep to the soft tissue ulcer. This could represent infectious sesamoiditis. Otherwise, normal exam. Electronically Signed   By: Lorriane Shire M.D.   On: 01/17/2015 11:58   Dg Foot Complete Right  01/16/2015  CLINICAL DATA:  Pain over the entire foot. EXAM: RIGHT FOOT COMPLETE - 3+ VIEW COMPARISON:  None. FINDINGS: There is generalized osteopenia. There is no acute fracture or dislocation. There is mild degenerative changes of the first MTP joint. There is a soft tissue ulcer overlying the medial aspect of the first MTP joint with soft tissue emphysema. No cortical destruction or periosteal reaction. IMPRESSION: Soft tissue ulcer along the medial aspect of the first MTP joint with mild soft tissue emphysema. No evidence of osteomyelitis of the right foot. Electronically Signed   By: Kathreen Devoid   On: 01/16/2015 13:01    ROS today the patient denies any shortness of breath or chest pains. He does report pain along the right great toe where the area of eschar is located. Blood pressure 83/58, pulse 51, temperature  98.7 F (37.1 C), temperature source Oral, resp. rate 20, height 6' 1"  (1.854 m), weight 93.8 kg (206 lb 12.7 oz), SpO2 98 %.   Physical Exam: 2 x 3 cm area of eschar over the medial aspect of the right great toe MTP joint full-thickness there is some swelling and ecchymosis around this area as well as some delamination of the  epidermis from the dermis. At the tip of the right great toe there is a 1 cm area of full-thickness eschar as well with some heaped up epidermal tissue around it. After discussing options we elected to debride this sharply with a suture set which was accomplished over about 20 minutes and it was noted that the foot was free much insensate to the debridement which was full-thickness down to the sub-dermal region of the area of eschar after it had been unroofed. As well as the tip of the great toe. This area was then cleansed with hydrogen peroxide and a dressing of 4 x 4's and Kerlix was applied.    After [place]   Assessment/Plan: Right foot ulceration probably secondary to peripheral vascular disease, diabetes with lack of sensation of the foot as well as neglect of hygiene by the patient. Debrided at the bedside today down to potentially viable tissue. This is been discussed with the wound care nurse specialist who will begin enzyme treatment and dressing changes. We will check the wound over the next couple of days to see if it is improving and if not he may need a formal debridement in the operating room. No further surgery is planned at this time.  [name] 01/17/2015, 12:59 PM

## 2015-01-17 NOTE — Consult Note (Signed)
WOC follow-up: Ortho service in earlier to debride wounds on right foot.  Discussed plan of care via phone call and provided topical treatment orders for bedside nurses with daily Santyl for continued chemical debridement.  Please re-consult if further assistance is needed.  Thank-you,  Julien Girt MSN, [place], [place], [place], [place]

## 2015-01-17 NOTE — Progress Notes (Signed)
Pt's BP  is 99/50 mm of hg and HR 51/min. Pt is asking for pain med. On-call provider Tylene Fantasia, NP notified . Order received for 500 cc of NS bolus and x1 dose of po OxyIR. Provider also made aware of HGB drop from 10.7 to 8.7 gm/dl this morning.

## 2015-01-17 NOTE — Progress Notes (Addendum)
PROGRESS NOTE  Marc [name] [id number] DOB: [birth year] DOA: 01/16/2015 PCP: [name], MD  HPI/Recap of past 24 hours:   hypoxia, needing oxygen supplement overnight,  C/o right foot pain  Assessment/Plan: Principal Problem:   Cellulitis Active Problems:   Hypertensive heart disease   Type 2 diabetes mellitus with peripheral neuropathy (HCC)   Gout   Hyperlipidemia   GERD (gastroesophageal reflux disease)   CAD (coronary artery disease), native coronary artery   Acute renal failure superimposed on stage 3 chronic kidney disease (HCC)   SIRS (systemic inflammatory response syndrome) ([place])   Acute on chronic kidney failure ([place])   Type 2 diabetes mellitus with right diabetic foot infection ([place])  #1. Cellulitis of the right foot in the setting of gout flare and diabetic foot ulcer. X-ray of foot with soft tissue ulcer no evidence of osteomyelitis --Vancomycin and Zosyn per pharmacy from admission -OP dopplar neg dvt - Wound consult, recomended MRI  And ortho consult -ortho did bedside debridement  #2.SIRS -Afebrile(tmax 100) with leukocytosis mildely elevated lactic, mild tachycardia acid and source -better, continue ivf/abx  #3. Diabetes. On oral agent. Related on admission likely related to recent prednisone use -We use sliding scale insulin -Hg A1c  #4. Acute renal failure superimposed on chronic stage III. Baseline creatinine appears to be 1.30-1.4. He had an on admission is 2.45 likely related to above. -hold any nephrotoxins -Fluid resuscitation as noted above -Monitor urine output -Recheck in the morning, ua/renal US unremarkable  #5. CAD. No chest pain.  -Monitor on telemetry  6. Hypertension. Somewhat soft in the emergency department -Fluids as noted above. -Will hold his home Lasix and his home lisinopril -Monitor  #7. Gout. Pain partially related to gout? Reported just finished prednisone prior to admission - loaded with colchicine  and provide daily dosage -Hold his home allopurinol due to cr elevation -Continue gabapentin -check uric acid  #8 PA D. Status post iliac artery stent and is on Plavix -Outpatient Doppler negative for DVT -continue home meds  #9 hypoxia, likely from atelectasis, cxr +hypoventilation/atelectasis, incentive spirometer  Code Status: full  Family Communication: patient   Disposition Plan: home with home health in a few days   Consultants:  Ortho Dr. Frederik Pear  Wound care  Procedures:  Bedside debridement to right foot  Antibiotics:  vanc/zosyn   Objective: BP 99/50 mmHg  Pulse 53  Temp(Src) 98.8 F (37.1 C) (Oral)  Resp 20  Ht 6\' 1"  (1.854 m)  Wt 206 lb 12.7 oz (93.8 kg)  BMI 27.29 kg/m2  SpO2 100%  Intake/Output Summary (Last 24 hours) at 01/17/15 0906 Last data filed at 01/17/15 [id number]  Gross per 24 hour  Intake 1360.83 ml  Output    420 ml  Net 940.83 ml   Filed Weights   01/16/15 1300 01/16/15 1635 01/17/15 0517  Weight: 195 lb 15.8 oz (88.9 kg) 204 lb 11.2 oz (92.851 kg) 206 lb 12.7 oz (93.8 kg)    Exam:   General:  NAD  Cardiovascular: RRR  Respiratory: CTABL  Abdomen: Soft/ND/NT, positive BS  Musculoskeletal: No Edema  Neuro: aaox3  Data Reviewed: Basic Metabolic Panel:  Recent Labs Lab 01/16/15 1227 01/17/15 0452  NA 135 138  K 5.0 4.8  CL 100* 107  CO2 23 24  GLUCOSE 287* 95  BUN 72* 74*  CREATININE 2.45* 2.64*  CALCIUM 9.1 8.4*   Liver Function Tests:  Recent Labs Lab 01/16/15 1227  AST 12*  ALT 6*  ALKPHOS 83  BILITOT 0.6  PROT 6.9  ALBUMIN 3.3*   No results for input(s): LIPASE, AMYLASE in the last 168 hours. No results for input(s): AMMONIA in the last 168 hours. CBC:  Recent Labs Lab Feb 08, 2015 1227 01/17/15 0452  WBC 16.9* 12.7*  NEUTROABS 15.0*  --   HGB 10.7* 8.7*  HCT 32.9* 27.0*  MCV 100.0 101.1*  PLT 221 187   Cardiac Enzymes:   No results for input(s): CKTOTAL, CKMB, CKMBINDEX, TROPONINI  in the last 168 hours. BNP (last 3 results) No results for input(s): BNP in the last 8760 hours.  ProBNP (last 3 results) No results for input(s): PROBNP in the last 8760 hours.  CBG:  Recent Labs Lab February 08, 2015 1215 2015-02-08 1653 02-08-15 2303 01/17/15 0756  GLUCAP 239* 242* 110* 75    No results found for this or any previous visit (from the past 240 hour(s)).   Studies: Dg Foot Complete Right  02-08-2015  CLINICAL DATA:  Pain over the entire foot. EXAM: RIGHT FOOT COMPLETE - 3+ VIEW COMPARISON:  None. FINDINGS: There is generalized osteopenia. There is no acute fracture or dislocation. There is mild degenerative changes of the first MTP joint. There is a soft tissue ulcer overlying the medial aspect of the first MTP joint with soft tissue emphysema. No cortical destruction or periosteal reaction. IMPRESSION: Soft tissue ulcer along the medial aspect of the first MTP joint with mild soft tissue emphysema. No evidence of osteomyelitis of the right foot. Electronically Signed   By: Kathreen Devoid   On: 02-08-15 13:01    Scheduled Meds: . clopidogrel  75 mg Oral Daily  . colchicine  0.6 mg Oral BID  . gabapentin  1,200 mg Oral 3 times per day  . glimepiride  4 mg Oral BID  . insulin aspart  0-15 Units Subcutaneous TID WC  . insulin aspart  0-5 Units Subcutaneous QHS  . pantoprazole  40 mg Oral Daily  . pravastatin  10 mg Oral q1800  . vancomycin  750 mg Intravenous Q24H    Continuous Infusions: . sodium chloride 75 mL/hr at 01/17/15 0109     Time spent: 68mins  [name] MD, PhD  [facility] Pager [phone]. If 7PM-7AM, please contact night-coverage at [email or web address], password [facility] 01/17/2015, 9:06 AM  LOS: 1 day

## 2015-01-18 LAB — URIC ACID: URIC ACID, SERUM: 6.3 mg/dL (ref 4.4–7.6)

## 2015-01-18 LAB — GLUCOSE, CAPILLARY
GLUCOSE-CAPILLARY: 113 mg/dL — AB (ref 65–99)
GLUCOSE-CAPILLARY: 161 mg/dL — AB (ref 65–99)
GLUCOSE-CAPILLARY: 128 mg/dL — AB (ref 65–99)
Glucose-Capillary: 175 mg/dL — ABNORMAL HIGH (ref 65–99)

## 2015-01-18 MED ORDER — SORBITOL 70 % SOLN
30.0000 mL | Freq: Every day | Status: DC | PRN
  Filled 2015-01-18: qty 30

## 2015-01-18 MED ORDER — GABAPENTIN 400 MG PO CAPS
800.0000 mg | ORAL_CAPSULE | Freq: Three times a day (TID) | ORAL | Status: DC
  Administered 2015-01-18 – 2015-01-21 (×10): 800 mg via ORAL
  Filled 2015-01-18 (×10): qty 2

## 2015-01-18 MED ORDER — POLYETHYLENE GLYCOL 3350 17 G PO PACK
17.0000 g | PACK | Freq: Two times a day (BID) | ORAL | Status: DC
  Administered 2015-01-18 – 2015-01-21 (×6): 17 g via ORAL
  Filled 2015-01-18 (×7): qty 1

## 2015-01-18 NOTE — Clinical Documentation Improvement (Signed)
Internal Medicine  Can the diagnosis of SIRS be further specified?   Sepsis - specify causative organism if known  Determine if there is Severe Sepsis (Sepsis with organ dysfunction - specify), Septic Shock if present}  Other  Clinically Undetermined  Document any associated diagnoses/conditions.  Supporting Information: (As per notes) Cellulitis of right foot and diabetic foot ulcer and SIRS BP 142/61 mmHg  Pulse 105  Temp(Src) 98.9 F (37.2 C) (Oral)  Resp 20  Wt 88.9 kg  SpO2 99%   Component     Latest Ref Rng 01/16/2015 01/16/2015 01/17/2015        12:49 PM  2:13 PM  4:52 AM  Lactic Acid, Venous     0.5 - 2.0 mmol/L 2.08 (HH) 2.66 (HH) 0.6   Please update your documentation within the medical record to reflect your response to this query. Thank you.  Please exercise your independent, professional judgment when responding. A specific answer is not anticipated or expected.  Thank You, Alessandra Grout, RN, BSN, CCDS,Clinical Documentation Specialist:  [phone]  [phone]=[facility]- Health Information Management

## 2015-01-18 NOTE — Progress Notes (Signed)
Pharmacy Antibiotic Follow-up Note  Marc [name] is a 74 y.o. year-old male admitted on 01/16/2015.  The patient is currently on day 3 of Zosyn and Vancomycin for R foot ulceration in setting of DM and peripheral vascular disease. S/p bedside debridement by ortho on 12/20. MRI showed edema and possible infectious sesamoiditis, otherwise normal.  Assessment/Plan: 1. Continue EI Zosyn 3.375 grams IV every 8 hours 2. Remains on vancomycin 750 mg IV every 24 hours; if continued will likely obtain VT in 1-2 more days 3. Await px micro data and narrow/deescalte antibiotics as feasible  4. AKI /elevated SCr; remains above previous labs that we have from 2015 but UOP remains good. Continue to follow closely and adjust abx as needed   Temp (24hrs), Avg:98.7 F (37.1 C), Min:97.3 F (36.3 C), Max:99.6 F (37.6 C)   Recent Labs Lab 01/16/15 1227 01/17/15 0452 01/17/15 1620  WBC 16.9* 12.7* 11.8*    Recent Labs Lab 01/16/15 1227 01/17/15 0452 01/17/15 1620  CREATININE 2.45* 2.64* 2.54*   SCr ~ 1.3 in 2015  No Known Allergies  Antimicrobials this admission: Zosyn 12/19>> Vanc 12/19>>  Levels/dose changes this admission: None to date  Microbiology results: 12/19 BCx: ngtd 12/19: MRSA PCR: neg  Thank you for allowing pharmacy to be a part of this patient's care.  Vincenza Hews, PharmD, BCPS 01/18/2015, 8:44 AM Pager: [phone]

## 2015-01-18 NOTE — Evaluation (Addendum)
Physical Therapy Evaluation Patient Details Name: Marc [name] MRN: [id number] DOB: 03/29/1940 Today's Date: 01/18/2015   History of Present Illness  Pt is 74 y.o. male admitted with cellulitis of R LE. Bedside debridement of 2x3 cm wound at R 1st MT head. PMHx includes DM, PAD, HTN, gout, bradycardia  Clinical Impression  Pt was sleepy and lethargic upon arrival but gradually improved during session. Pt presents with condition listed above that limits R LE strength and mobility and impairs activity tolerance. SpO2 was maintained at 89-91% following activity but pt stated he did not feel bad or have SOB. Education was provided on positioning, HEP, and POC. Pt would benefit from acute PT to improve R LE strength/mobility and safety with functional mobility. Given pt's current level of function, SNF would be most appropriate/safest D/C venue at this time.      Follow Up Recommendations SNF    Equipment Recommendations  3in1 (PT)    Recommendations for Other Services OT consult     Precautions / Restrictions Restrictions Weight Bearing Restrictions: No      Mobility  Bed Mobility Overal bed mobility: Needs Assistance Bed Mobility: Supine to Sit     Supine to sit: Supervision     General bed mobility comments: Mod VC for hand placement, sequencing. Use of hand rails to pivot. Prolonged time for trunk elevation.   Transfers Overall transfer level: Needs assistance   Transfers: Sit to/from Stand Sit to Stand: Min guard         General transfer comment: No physical assist needed but min guard for stability/safety. Prolonged time to fully elevate trunk. VC for hand placement, sequencing, activating glutes to stand fully  Ambulation/Gait Ambulation/Gait assistance: Min guard Ambulation Distance (Feet): 5 Feet Assistive device: Rolling walker (2 wheeled) Gait Pattern/deviations: Step-to pattern;Decreased step length - right;Decreased step length - left   Gait velocity  interpretation: Below normal speed for age/gender General Gait Details: Pt took very short steps and required extended time to advance foot. C/o increased pain in R foot with more steps. Max VC for RW sequence and to push through UEs.   Stairs            Wheelchair Mobility    Modified Rankin (Stroke Patients Only)       Balance Overall balance assessment: Needs assistance Sitting-balance support: No upper extremity supported Sitting balance-Leahy Scale: Fair     Standing balance support: Bilateral upper extremity supported Standing balance-Leahy Scale: Poor Standing balance comment: use of RW                             Pertinent Vitals/Pain Pain Assessment: Faces Faces Pain Scale: Hurts even more Pain Location: R Foot Pain Descriptors / Indicators: Sharp;Shooting Pain Intervention(s): Limited activity within patient's tolerance;Monitored during session;Repositioned    Home Living Family/patient expects to be discharged to:: Private residence Living Arrangements: Alone Available Help at Discharge: Friend(s) Type of Home: Mobile home Home Access: Stairs to enter Entrance Stairs-Rails: Left Entrance Stairs-Number of Steps: 5 Home Layout: One level Home Equipment: [place] - 2 wheels;Cane - single point;Shower seat      Prior Function Level of Independence: Independent with assistive device(s)         Comments: Pt ambulated with cane when out of house     Hand Dominance   Dominant Hand: Right    Extremity/Trunk Assessment   Upper Extremity Assessment: Generalized weakness  Lower Extremity Assessment: Generalized weakness      Cervical / Trunk Assessment: Kyphotic  Communication   Communication: No difficulties  Cognition Arousal/Alertness: Lethargic Behavior During Therapy: WFL for tasks assessed/performed Overall Cognitive Status: Difficult to assess                      General Comments General comments (skin  integrity, edema, etc.): Used orthopedic shoe to ambulate. Pt's R foot was bandaged. R LE was irritated from cellulitis and tender to touch    Exercises General Exercises - Lower Extremity Ankle Circles/Pumps: AROM;20 reps;Both;Seated Long Arc Quad: AROM;Both;10 reps;Seated Hip ABduction/ADduction: 10 reps;AAROM;Seated;Both Hip Flexion/Marching: AROM;10 reps;Both;Seated      Assessment/Plan    PT Assessment Patient needs continued PT services  PT Diagnosis Difficulty walking;Generalized weakness;Acute pain   PT Problem List Decreased strength;Decreased range of motion;Decreased activity tolerance;Decreased balance;Decreased mobility;Decreased safety awareness;Decreased knowledge of use of DME;Pain  PT Treatment Interventions DME instruction;Gait training;Stair training;Functional mobility training;Therapeutic activities;Therapeutic exercise;Patient/family education;Balance training   PT Goals (Current goals can be found in the Care Plan section) Acute Rehab PT Goals Patient Stated Goal: to return home PT Goal Formulation: With patient Time For Goal Achievement: 01/25/15 Potential to Achieve Goals: Fair    Frequency Min 3X/week   Barriers to discharge        Co-evaluation               End of Session Equipment Utilized During Treatment: Other (comment) (ortho shoe for R foot) Activity Tolerance: Patient limited by pain Patient left: in chair;with nursing/sitter in room;with call bell/phone within reach;with chair alarm set Nurse Communication: Mobility status;Weight bearing status         Time: 1442-1520 PT Time Calculation (min) (ACUTE ONLY): 38 min   Charges:   PT Evaluation $Initial PT Evaluation Tier I: 1 Procedure PT Treatments $Therapeutic Exercise: 8-22 mins $Therapeutic Activity: 8-22 mins   PT G Codes:        Aerika Groll, Tessie Fass 01/18/2015, 6:10 PM Haynes Bast, SPT 01/18/2015 6:10 PM   01/18/2015  Donnella Sham,  PT [phone]  (pager)

## 2015-01-18 NOTE — Progress Notes (Signed)
[facility] PROGRESS NOTE  Marc [name] [id number] DOB: 10-Jul-1940 DOA: 01/16/2015 PCP: [name], MD  Assessment/Plan: 1. Cellulitis of the right foot in the setting of gout flare and diabetic foot ulcer. X-ray of foot with soft tissue ulcer no evidence of osteomyelitis --Vancomycin and Zosyn per pharmacy from admission -OP doppler neg dvt - MR Iwith probably infectious sesamoiditis And ortho consult -ortho did bedside debridement  2.Sepsis; presents with leukocytosis, SIRS critiria, source likely  right foot infection.  -Afebrile(tmax 100) with leukocytosis mildely elevated lactic, mild tachycardia acid and source -continue ivf/abx -blood culture no growth to date.   3. Diabetes. On oral agent. Related on admission likely related to recent prednisone use -We use sliding scale insulin -Hg A1c  4. Acute renal failure superimposed on chronic stage III. Baseline creatinine appears to be 1.30-1.4. He had an on admission is 2.45 likely related to above. -hold any nephrotoxins -Fluid resuscitation as noted above -Monitor urine output -cr trending down.   5. CAD. No chest pain.  -discontinue telemetry  6. Hypertension. Somewhat soft in the emergency department -Fluids as noted above. -Will hold his home Lasix and his home lisinopril -Monitor  7. Gout. Pain partially related to gout? Reported just finished prednisone prior to admission - loaded with colchicine and provide daily dosage -Hold his home allopurinol due to cr elevation -Continue gabapentin, lower dose due to renal failure/  - uric acid 6.3  8 PA D. Status post iliac artery stent and is on Plavix -Outpatient Doppler negative for DVT -continue home meds  9 Hypoxia, likely from atelectasis, cxr +hypoventilation/atelectasis, incentive spirometer   Code Status: full code.  Family Communication: care discussed with patient.  Disposition Plan: remain inpatient for IV antibiotics. Needs PT  evaluation.    Consultants:  Ortho, Dr [name].   Procedures:  Bedside debridement of right foot wound.   Antibiotics:  Vancomycin 01-16-2015  Zosyn 01-16-2015  HPI/Subjective: He is complaining of severe pain right foot. Last BM on monday.   Objective: Filed Vitals:   01/18/15 0548 01/18/15 0743  BP: 127/43 110/55  Pulse: 54 55  Temp: 99.6 F (37.6 C) 99.4 F (37.4 C)  Resp: 18 23    Intake/Output Summary (Last 24 hours) at 01/18/15 1407 Last data filed at 01/18/15 [id number]  Gross per 24 hour  Intake 1398.75 ml  Output   2080 ml  Net -681.25 ml   Filed Weights   01/16/15 1635 01/17/15 0517 01/18/15 0554  Weight: 92.851 kg (204 lb 11.2 oz) 93.8 kg (206 lb 12.7 oz) 93.2 kg (205 lb 7.5 oz)    Exam:   General:  Alert in no distress  Cardiovascular: S 1, S 2 RRR  Respiratory: CTA  Abdomen: Bs present, soft, nt  Musculoskeletal: Right foot with dressing.   Data Reviewed: Basic Metabolic Panel:  Recent Labs Lab 01/16/15 1227 01/17/15 0452 01/17/15 1620  NA 135 138 138  K 5.0 4.8 5.0  CL 100* 107 106  CO2 23 24 25   GLUCOSE 287* 95 180*  BUN 72* 74* 66*  CREATININE 2.45* 2.64* 2.54*  CALCIUM 9.1 8.4* 8.1*   Liver Function Tests:  Recent Labs Lab 01/16/15 1227  AST 12*  ALT 6*  ALKPHOS 83  BILITOT 0.6  PROT 6.9  ALBUMIN 3.3*   No results for input(s): LIPASE, AMYLASE in the last 168 hours. No results for input(s): AMMONIA in the last 168 hours. CBC:  Recent Labs Lab 01/16/15 1227 01/17/15 0452 01/17/15 1620  WBC 16.9*  12.7* 11.8*  NEUTROABS 15.0*  --  9.4*  HGB 10.7* 8.7* 8.6*  HCT 32.9* 27.0* 26.5*  MCV 100.0 101.1* 102.3*  PLT 221 187 180   Cardiac Enzymes: No results for input(s): CKTOTAL, CKMB, CKMBINDEX, TROPONINI in the last 168 hours. BNP (last 3 results) No results for input(s): BNP in the last 8760 hours.  ProBNP (last 3 results) No results for input(s): PROBNP in the last 8760 hours.  CBG:  Recent Labs Lab  01/17/15 1233 01/17/15 1726 01/17/15 2056 01/18/15 0803 01/18/15 1229  GLUCAP 207* 246* 163* 113* 175*    Recent Results (from the past 240 hour(s))  Culture, blood (routine x 2)     Status: None (Preliminary result)   Collection Time: 01/16/15  1:55 PM  Result Value Ref Range Status   Specimen Description BLOOD LEFT ANTECUBITAL  Final   Special Requests BOTTLES DRAWN AEROBIC AND ANAEROBIC 5CC  Final   Culture NO GROWTH 2 DAYS  Final   Report Status PENDING  Incomplete  Blood culture (routine x 2)     Status: None (Preliminary result)   Collection Time: 01/16/15  2:00 PM  Result Value Ref Range Status   Specimen Description BLOOD LEFT FOREARM  Final   Special Requests BOTTLES DRAWN AEROBIC AND ANAEROBIC 5CC  Final   Culture NO GROWTH 2 DAYS  Final   Report Status PENDING  Incomplete     Studies: US Renal  01/17/2015  CLINICAL DATA:  Acute renal failure EXAM: RENAL / URINARY TRACT ULTRASOUND COMPLETE COMPARISON:  04/14/2014 FINDINGS: Right Kidney: Length: 11.8 cm. Normal echogenicity. No hydronephrosis. No renal calculus. Again noted a lower pole cyst measures 1.4 x 1.2 cm. On the prior exam measures 1.5 x 1 cm. Left Kidney: Length: 11.3 cm length. Echogenicity within normal limits. No mass or hydronephrosis visualized. Bladder: Appears normal for degree of bladder distention. Prostate gland measures 4.2 x 3.8 x 5.3 cm with mild indentation of urinary bladder base. IMPRESSION: 1. No hydronephrosis.  No renal calculi. 2. Stable right renal lower pole cyst measures 1.4 x 1.2 cm. 3. Mild enlarged prostate gland with indentation of urinary bladder base measures 5.3 x 4.2 cm. Electronically Signed   By: Lahoma Crocker M.D.   On: 01/17/2015 11:04   Mr Foot Right Wo Contrast  01/17/2015  CLINICAL DATA:  Progressive cellulitis with pain and redness and swelling of the right foot. Diabetic foot ulcer at the plantar aspect of the great toe. Gout. EXAM: MRI OF THE RIGHT FOREFOOT WITHOUT CONTRAST  TECHNIQUE: Multiplanar, multisequence MR imaging was performed. No intravenous contrast was administered. COMPARISON:  Radiographs dated 01/16/2015 FINDINGS: There is subtle edema and slight irregularity of the medial sesamoid adjacent to the head of the first metatarsal immediately deep to the plantar ulcer. The other bones of the forefoot appear normal. There is no abscess. There is prominent subcutaneous edema on the dorsum of the foot. The tendons and muscles demonstrate no significant abnormalities. No joint effusions. IMPRESSION: Edema and irregularity of the medial sesamoid at the head of the first metatarsal just deep to the soft tissue ulcer. This could represent infectious sesamoiditis. Otherwise, normal exam. Electronically Signed   By: Lorriane Shire M.D.   On: 01/17/2015 11:58   Dg Chest Port 1 View  01/17/2015  CLINICAL DATA:  Hypoxia EXAM: PORTABLE CHEST 1 VIEW COMPARISON:  07/26/2014 FINDINGS: Hypoventilation with decreased lung volume compared with the prior study. Mild bibasilar atelectasis. Negative for heart failure or effusion. IMPRESSION: Hypoventilation with mild  bibasilar atelectasis. Electronically Signed   By: Franchot Gallo M.D.   On: 01/17/2015 14:13    Scheduled Meds: . clopidogrel  75 mg Oral Daily  . colchicine  0.6 mg Oral Daily  . collagenase   Topical Daily  . gabapentin  800 mg Oral 3 times per day  . glimepiride  4 mg Oral BID  . insulin aspart  0-15 Units Subcutaneous TID WC  . insulin aspart  0-5 Units Subcutaneous QHS  . pantoprazole  40 mg Oral Daily  . piperacillin-tazobactam (ZOSYN)  IV  3.375 g Intravenous 3 times per day  . pravastatin  10 mg Oral q1800  . vancomycin  750 mg Intravenous Q24H   Continuous Infusions: . 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     Niel Hummer A  [facility] Pager [phone]. If 7PM-7AM, please contact night-coverage at [email or web address], password [facility] 01/18/2015, 2:07 PM  LOS: 2 days

## 2015-01-19 DIAGNOSIS — N183 Chronic kidney disease, stage 3 (moderate): Secondary | ICD-10-CM

## 2015-01-19 LAB — BASIC METABOLIC PANEL
ANION GAP: 9 (ref 5–15)
BUN: 45 mg/dL — AB (ref 6–20)
CHLORIDE: 111 mmol/L (ref 101–111)
CO2: 23 mmol/L (ref 22–32)
Calcium: 8.6 mg/dL — ABNORMAL LOW (ref 8.9–10.3)
Creatinine, Ser: 2.13 mg/dL — ABNORMAL HIGH (ref 0.61–1.24)
GFR calc Af Amer: 33 mL/min — ABNORMAL LOW (ref >60)
GFR, EST NON AFRICAN AMERICAN: 29 mL/min — AB (ref >60)
GLUCOSE: 80 mg/dL (ref 65–99)
POTASSIUM: 4.7 mmol/L (ref 3.5–5.1)
SODIUM: 143 mmol/L (ref 135–145)

## 2015-01-19 LAB — CBC
HCT: 27.7 % — ABNORMAL LOW (ref 39.0–52.0)
HEMOGLOBIN: 9.1 g/dL — AB (ref 13.0–17.0)
MCH: 32.7 pg (ref 26.0–34.0)
MCHC: 32.9 g/dL (ref 30.0–36.0)
MCV: 99.6 fL (ref 78.0–100.0)
PLATELETS: 193 10*3/uL (ref 150–400)
RBC: 2.78 MIL/uL — AB (ref 4.22–5.81)
RDW: 14.1 % (ref 11.5–15.5)
WBC: 9.7 10*3/uL (ref 4.0–10.5)

## 2015-01-19 LAB — GLUCOSE, CAPILLARY
GLUCOSE-CAPILLARY: 82 mg/dL (ref 65–99)
GLUCOSE-CAPILLARY: 108 mg/dL — AB (ref 65–99)
Glucose-Capillary: 203 mg/dL — ABNORMAL HIGH (ref 65–99)
Glucose-Capillary: 176 mg/dL — ABNORMAL HIGH (ref 65–99)

## 2015-01-19 MED ORDER — ENOXAPARIN SODIUM 40 MG/0.4ML ~~LOC~~ SOLN
40.0000 mg | SUBCUTANEOUS | Status: DC
  Administered 2015-01-19 – 2015-01-21 (×3): 40 mg via SUBCUTANEOUS
  Filled 2015-01-19 (×3): qty 0.4

## 2015-01-19 NOTE — Care Management Important Message (Signed)
Important Message  Patient Details  Name: Marc [name] MRN: [id number] Date of Birth: 12-Aug-1940   Medicare Important Message Given:  Yes    Nathen May 01/19/2015, 2:30 PM

## 2015-01-19 NOTE — Progress Notes (Signed)
Pt's HR drop to as low as 38 twice and bounce back to 50's. Pt is sleeping in bed and asymtomatic. Regalado MD paged and made aware. Will continue to monitor.  Teryl Lucy

## 2015-01-19 NOTE — Progress Notes (Signed)
CSW spoke w/ [facility] as requested by patient. [facility] has no bed availability until next Friday.   Patient has decided to return home at discharge until a bed becomes available at [facility].  CSW will alert RNCM for Home Health services to facilitate facility transition.   CSW signing off.  Marc [name] LCSWA [phone]

## 2015-01-19 NOTE — Progress Notes (Signed)
[facility] PROGRESS NOTE  Marc [name] [id number] DOB: [birth year] DOA: 01/16/2015 PCP: [name], MD  Assessment/Plan: 1. Cellulitis of the right foot in the setting of gout flare and diabetic foot ulcer. X-ray of foot with soft tissue ulcer no evidence of osteomyelitis --Vancomycin and Zosyn per pharmacy from admission -OP doppler neg dvt - MR Iwith probably infectious sesamoiditis And ortho consult -ortho did bedside debridement 12-20 -continue with IV antibiotics.   2.Sepsis; with organ disfunction, renal failure.  - presents with leukocytosis, SIRS critiria, source likely  right foot infection. Unable to determine organism.  -Afebrile(tmax 100) with leukocytosis mildely elevated lactic, mild tachycardia acid and source -continue ivf/abx -blood culture no growth to date.   3. Diabetes. On oral agent. Related on admission likely related to recent prednisone use -We use sliding scale insulin -Hg A1c  4. Acute renal failure superimposed on chronic stage III. Baseline creatinine appears to be 1.30-1.4. He had an on admission is 2.45 likely related to above. -hold any nephrotoxins -Fluid resuscitation as noted above -Monitor urine output -cr trending down 2.1 today.   5. CAD. No chest pain.  -discontinue telemetry  6. Hypertension. Somewhat soft in the emergency department -Fluids as noted above. -Will hold his home Lasix and his home lisinopril -Monitor  7. Gout. Pain partially related to gout? Reported just finished prednisone prior to admission - loaded with colchicine and provide daily dosage -Hold his home allopurinol due to cr elevation -Continue gabapentin, lower dose due to renal failure/  - uric acid 6.3  8 PA D. Status post iliac artery stent and is on Plavix -Outpatient Doppler negative for DVT -continue home meds  9 Hypoxia, likely from atelectasis, cxr +hypoventilation/atelectasis, incentive spirometer   Code Status: full code.   Family Communication: care discussed with patient.  Disposition Plan: remain inpatient for IV antibiotics and improvement of renal function. SW consulted to help with SNF placement,.    Consultants:  Ortho, Dr [name].   Procedures:  Bedside debridement of right foot wound.   Antibiotics:  Vancomycin 01-16-2015  Zosyn 01-16-2015  HPI/Subjective: No BM yet. Decline suppository.  Still with right foot pain.     Objective: Filed Vitals:   01/19/15 0345 01/19/15 0516  BP: 124/51 122/57  Pulse: 51 59  Temp:  97.9 F (36.6 C)  Resp: 22 18    Intake/Output Summary (Last 24 hours) at 01/19/15 0917 Last data filed at 01/19/15 0542  Gross per 24 hour  Intake 1287.5 ml  Output   1301 ml  Net  -13.5 ml   Filed Weights   01/17/15 0517 01/18/15 0554 01/19/15 0516  Weight: 93.8 kg (206 lb 12.7 oz) 93.2 kg (205 lb 7.5 oz) 91.6 kg (201 lb 15.1 oz)    Exam:   General:  Alert in no distress  Cardiovascular: S 1, S 2 RRR  Respiratory: CTA  Abdomen: Bs present, soft, nt  Musculoskeletal: Right foot with dressing.   Data Reviewed: Basic Metabolic Panel:  Recent Labs Lab 01/16/15 1227 01/17/15 0452 01/17/15 1620 01/19/15 0523  NA 135 138 138 143  K 5.0 4.8 5.0 4.7  CL 100* 107 106 111  CO2 23 24 25 23   GLUCOSE 287* 95 180* 80  BUN 72* 74* 66* 45*  CREATININE 2.45* 2.64* 2.54* 2.13*  CALCIUM 9.1 8.4* 8.1* 8.6*   Liver Function Tests:  Recent Labs Lab 01/16/15 1227  AST 12*  ALT 6*  ALKPHOS 83  BILITOT 0.6  PROT 6.9  ALBUMIN 3.3*  No results for input(s): LIPASE, AMYLASE in the last 168 hours. No results for input(s): AMMONIA in the last 168 hours. CBC:  Recent Labs Lab 01/16/15 1227 01/17/15 0452 01/17/15 1620 01/19/15 0523  WBC 16.9* 12.7* 11.8* 9.7  NEUTROABS 15.0*  --  9.4*  --   HGB 10.7* 8.7* 8.6* 9.1*  HCT 32.9* 27.0* 26.5* 27.7*  MCV 100.0 101.1* 102.3* 99.6  PLT 221 187 180 193   Cardiac Enzymes: No results for input(s):  CKTOTAL, CKMB, CKMBINDEX, TROPONINI in the last 168 hours. BNP (last 3 results) No results for input(s): BNP in the last 8760 hours.  ProBNP (last 3 results) No results for input(s): PROBNP in the last 8760 hours.  CBG:  Recent Labs Lab 01/18/15 0803 01/18/15 1229 01/18/15 1702 01/18/15 2110 01/19/15 0819  GLUCAP 113* 175* 161* 128* 82    Recent Results (from the past 240 hour(s))  Culture, blood (routine x 2)     Status: None (Preliminary result)   Collection Time: 01/16/15  1:55 PM  Result Value Ref Range Status   Specimen Description BLOOD LEFT ANTECUBITAL  Final   Special Requests BOTTLES DRAWN AEROBIC AND ANAEROBIC 5CC  Final   Culture NO GROWTH 2 DAYS  Final   Report Status PENDING  Incomplete  Blood culture (routine x 2)     Status: None (Preliminary result)   Collection Time: 01/16/15  2:00 PM  Result Value Ref Range Status   Specimen Description BLOOD LEFT FOREARM  Final   Special Requests BOTTLES DRAWN AEROBIC AND ANAEROBIC 5CC  Final   Culture NO GROWTH 2 DAYS  Final   Report Status PENDING  Incomplete     Studies: US Renal  01/17/2015  CLINICAL DATA:  Acute renal failure EXAM: RENAL / URINARY TRACT ULTRASOUND COMPLETE COMPARISON:  04/14/2014 FINDINGS: Right Kidney: Length: 11.8 cm. Normal echogenicity. No hydronephrosis. No renal calculus. Again noted a lower pole cyst measures 1.4 x 1.2 cm. On the prior exam measures 1.5 x 1 cm. Left Kidney: Length: 11.3 cm length. Echogenicity within normal limits. No mass or hydronephrosis visualized. Bladder: Appears normal for degree of bladder distention. Prostate gland measures 4.2 x 3.8 x 5.3 cm with mild indentation of urinary bladder base. IMPRESSION: 1. No hydronephrosis.  No renal calculi. 2. Stable right renal lower pole cyst measures 1.4 x 1.2 cm. 3. Mild enlarged prostate gland with indentation of urinary bladder base measures 5.3 x 4.2 cm. Electronically Signed   By: Lahoma Crocker M.D.   On: 01/17/2015 11:04   Mr Foot  Right Wo Contrast  01/17/2015  CLINICAL DATA:  Progressive cellulitis with pain and redness and swelling of the right foot. Diabetic foot ulcer at the plantar aspect of the great toe. Gout. EXAM: MRI OF THE RIGHT FOREFOOT WITHOUT CONTRAST TECHNIQUE: Multiplanar, multisequence MR imaging was performed. No intravenous contrast was administered. COMPARISON:  Radiographs dated 01/16/2015 FINDINGS: There is subtle edema and slight irregularity of the medial sesamoid adjacent to the head of the first metatarsal immediately deep to the plantar ulcer. The other bones of the forefoot appear normal. There is no abscess. There is prominent subcutaneous edema on the dorsum of the foot. The tendons and muscles demonstrate no significant abnormalities. No joint effusions. IMPRESSION: Edema and irregularity of the medial sesamoid at the head of the first metatarsal just deep to the soft tissue ulcer. This could represent infectious sesamoiditis. Otherwise, normal exam. Electronically Signed   By: Lorriane Shire M.D.   On: 01/17/2015 11:58  Dg Chest Port 1 View  01/17/2015  CLINICAL DATA:  Hypoxia EXAM: PORTABLE CHEST 1 VIEW COMPARISON:  07/26/2014 FINDINGS: Hypoventilation with decreased lung volume compared with the prior study. Mild bibasilar atelectasis. Negative for heart failure or effusion. IMPRESSION: Hypoventilation with mild bibasilar atelectasis. Electronically Signed   By: Franchot Gallo M.D.   On: 01/17/2015 14:13    Scheduled Meds: . clopidogrel  75 mg Oral Daily  . colchicine  0.6 mg Oral Daily  . collagenase   Topical Daily  . gabapentin  800 mg Oral 3 times per day  . glimepiride  4 mg Oral BID  . insulin aspart  0-15 Units Subcutaneous TID WC  . insulin aspart  0-5 Units Subcutaneous QHS  . pantoprazole  40 mg Oral Daily  . piperacillin-tazobactam (ZOSYN)  IV  3.375 g Intravenous 3 times per day  . polyethylene glycol  17 g Oral BID  . pravastatin  10 mg Oral q1800  . vancomycin  750 mg  Intravenous Q24H   Continuous Infusions: . sodium chloride 75 mL/hr at 01/17/15 1534    Principal Problem:   Cellulitis Active Problems:   Hypertensive heart disease   Type 2 diabetes mellitus with peripheral neuropathy (HCC)   Gout   Hyperlipidemia   GERD (gastroesophageal reflux disease)   CAD (coronary artery disease), native coronary artery   Acute renal failure superimposed on stage 3 chronic kidney disease (HCC)   SIRS (systemic inflammatory response syndrome) (HCC)   Acute on chronic kidney failure (HCC)   Type 2 diabetes mellitus with right diabetic foot infection ([place])    Time spent: 35 minutes.     Niel Hummer A  [facility] Pager [phone]. If 7PM-7AM, please contact night-coverage at [email or web address], password [facility] 01/19/2015, 9:17 AM  LOS: 3 days

## 2015-01-19 NOTE — Consult Note (Addendum)
WOC follow-up: Reason for Consult: Requested to re-assess right foot wounds after previous debridement by ortho service; PA at bedside during dressing change to assess wounds and discuss plan of care with patient. Wound type: Right great toe tip with full thickness wound; 90% eschar, 10% red; 1X1.2cm, dry dark red wound bed, no odor, small amt dark red drainage, no fluctuance, surrounded by generalized edema and erythremia. Right plantar foot with full thickness wound; 3X3X.3cm, 15% eschar/ 85% red and dry; small amt dark red drainage, no fluctuance, small amt dark red drainage, surrounded by dark purple-reddish macerated skin. Entire right foot with generalized edema and erythremia. Dressing procedure/placement/frequency: Continue enzymatic debridement to further assist with removal of nonviable tissue.If patient is not transferred to a SNF, then he could benefit from home health assistance after discharge for dressing changes.  Please re-consult if further assistance is needed. Thank-you,  Julien Girt MSN, [place], [place], [place], [place]

## 2015-01-19 NOTE — Clinical Social Work Note (Signed)
Clinical Social Work Assessment  Patient Details  Name: Marc [name] MRN: [id number] Date of Birth: 02/21/1940  Date of referral:  01/19/15               Reason for consult:  Facility Placement                Permission sought to share information with:  Facility Art therapist granted to share information::  Yes, Verbal Permission Granted  Name::     Marc [name]  Agency::  [facility]  Relationship::  Wife  Contact Information:  [phone]  Housing/Transportation Living arrangements for the past 2 months:  Single Family Home Source of Information:  Patient Patient Interpreter Needed:  None Criminal Activity/Legal Involvement Pertinent to Current Situation/Hospitalization:  No - Comment as needed Significant Relationships:  Spouse Lives with:  Spouse Do you feel safe going back to the place where you live?  No Need for family participation in patient care:  Yes (Comment)  Care giving concerns:  CSW received referral for possible SNF placement at time of discharge. CSW met with patient regarding PT recommendation of SNF placement at time of discharge. Patient reported that he would like to go to a skilled nursing facility before returning home given patient's current physical needs and fall risk. Patient expressed understanding of PT recommendation and are agreeable to SNF placement at time of discharge. CSW to continue to follow and assist with discharge planning needs.   Social Worker assessment / plan:  CSW spoke with patient concerning possibility of rehab at [facility] before returning home.  Employment status:  Retired Nurse, adult PT Recommendations:  [place] / Referral to community resources:  [place]  Patient/Family's Response to care:  Patient recognizes need for rehab before returning home and is agreeable to [facility] SNF. Patient reported that if [facility] is  unable to admit him, patient will return home until [facility] can admit him.   Patient/Family's Understanding of and Emotional Response to Diagnosis, Current Treatment, and Prognosis:  Patient is realistic regarding therapy needs. No questions/concerns about plan or treatment.    Emotional Assessment Appearance:  Appears stated age Attitude/Demeanor/Rapport:   (Appropriate and matter-of-fact) Affect (typically observed):  Accepting, Appropriate Orientation:  Oriented to Self, Oriented to Place, Oriented to  Time, Oriented to Situation Alcohol / Substance use:  Not Applicable Psych involvement (Current and /or in the community):  No (Comment)  Discharge Needs  Concerns to be addressed:  Care Coordination Readmission within the last 30 days:  No Current discharge risk:  None Barriers to Discharge:  Continued Medical Work up   [employer], [place] 01/19/2015, 1:05 PM

## 2015-01-19 NOTE — Progress Notes (Signed)
PATIENT ID: Marc [name]  MRN: [id number]  DOB/AGE:  [birth year] / 74 y.o.   Diabetic foot ulcer history of peripheral artery disease    PROGRESS NOTE Subjective:   Patient is alert, oriented, no Nausea, no Vomiting, yes passing gas, yes Bowel Movement. Taking PO well. Denies SOB, Chest or Calf Pain. Using Incentive Spirometer, PAS in place. Ambulate WBAT with patient walking 5 feet, Patient reports pain as mild.    Objective: Vital signs in last 24 hours: Temp:  [97.9 F (36.6 C)-99.1 F (37.3 C)] 98.7 F (37.1 C) (12/22 1352) Pulse Rate:  [51-66] 52 (12/22 1352) Resp:  [18-22] 20 (12/22 1352) BP: (116-130)/(51-68) 116/68 mmHg (12/22 1352) SpO2:  [92 %-100 %] 100 % (12/22 1352) Weight:  [91.6 kg (201 lb 15.1 oz)] 91.6 kg (201 lb 15.1 oz) (12/22 0516)    Intake/Output from previous day: I/O last 3 completed shifts: In: 2546.3 [P.O.:510; I.V.:1836.3; IV Piggyback:200] Out: [id number] [Urine:3380; Stool:1]   Intake/Output this shift: Total I/O In: 120 [P.O.:120] Out: 1200 [Urine:1200]   LABORATORY DATA:  Recent Labs  01/17/15 1620  01/18/15 2110 01/19/15 0523 01/19/15 0819 01/19/15 1151  WBC 11.8*  --   --  9.7  --   --   HGB 8.6*  --   --  9.1*  --   --   HCT 26.5*  --   --  27.7*  --   --   PLT 180  --   --  193  --   --   NA 138  --   --  143  --   --   K 5.0  --   --  4.7  --   --   CL 106  --   --  111  --   --   CO2 25  --   --  23  --   --   BUN 66*  --   --  45*  --   --   CREATININE 2.54*  --   --  2.13*  --   --   GLUCOSE 180*  --   --  80  --   --   GLUCAP  --   < > 128*  --  82 203*  CALCIUM 8.1*  --   --  8.6*  --   --   < > = values in this interval not displayed.  Examination: He patient's foot was reexamined and  The 2 x 3 cm area over the sesamoid region showed mild improvement.  The cellulitis has diminished.  The tip of the great toe has also had diminished cellulitis.  The wound nurse was there also and the dressing was changed and  redressed. Assessment:   Diabetic foot ulcer history of peripheral artery disease  ADDITIONAL DIAGNOSIS:  Diabetes, Hypertension and PAD, Gout, Tobacco Abuse  Plan: Patient is to continue with daily  Dressing changes.  The wound care nurse reviewed this with the patient also.  We both have recommended a discharged to a skilled nursing facility to help with the dressing changes as the patient has no help at home.  I also discussed possible outcomes.   If the patient's foot does not improve we may need to  consider further surgical options in the future.     Gavyn Ybarra R 01/19/2015, 3:10 PM

## 2015-01-20 DIAGNOSIS — N189 Chronic kidney disease, unspecified: Secondary | ICD-10-CM

## 2015-01-20 DIAGNOSIS — N179 Acute kidney failure, unspecified: Secondary | ICD-10-CM

## 2015-01-20 LAB — GLUCOSE, CAPILLARY
GLUCOSE-CAPILLARY: 128 mg/dL — AB (ref 65–99)
Glucose-Capillary: 61 mg/dL — ABNORMAL LOW (ref 65–99)
Glucose-Capillary: 117 mg/dL — ABNORMAL HIGH (ref 65–99)
Glucose-Capillary: 189 mg/dL — ABNORMAL HIGH (ref 65–99)
Glucose-Capillary: 97 mg/dL (ref 65–99)

## 2015-01-20 LAB — VANCOMYCIN, TROUGH: VANCOMYCIN TR: 10 ug/mL (ref 10.0–20.0)

## 2015-01-20 LAB — CBC
HCT: 28.5 % — ABNORMAL LOW (ref 39.0–52.0)
HEMOGLOBIN: 9 g/dL — AB (ref 13.0–17.0)
MCH: 31.6 pg (ref 26.0–34.0)
MCHC: 31.6 g/dL (ref 30.0–36.0)
MCV: 100 fL (ref 78.0–100.0)
PLATELETS: 212 10*3/uL (ref 150–400)
RBC: 2.85 MIL/uL — AB (ref 4.22–5.81)
RDW: 14 % (ref 11.5–15.5)
WBC: 9.7 10*3/uL (ref 4.0–10.5)

## 2015-01-20 LAB — BASIC METABOLIC PANEL
Anion gap: 10 (ref 5–15)
BUN: 36 mg/dL — AB (ref 6–20)
CHLORIDE: 112 mmol/L — AB (ref 101–111)
CO2: 21 mmol/L — ABNORMAL LOW (ref 22–32)
CREATININE: 1.9 mg/dL — AB (ref 0.61–1.24)
Calcium: 8.7 mg/dL — ABNORMAL LOW (ref 8.9–10.3)
GFR calc Af Amer: 38 mL/min — ABNORMAL LOW (ref >60)
GFR calc non Af Amer: 33 mL/min — ABNORMAL LOW (ref >60)
GLUCOSE: 60 mg/dL — AB (ref 65–99)
POTASSIUM: 4.7 mmol/L (ref 3.5–5.1)
SODIUM: 143 mmol/L (ref 135–145)

## 2015-01-20 NOTE — Progress Notes (Signed)
Pharmacy Antibiotic Follow-up Note  Marc [name] is a 74 y.o. year-old male admitted on 01/16/2015.  The patient is currently on day 5 of Zosyn and Vancomycin for R foot ulceration in setting of DM and peripheral vascular disease. S/p bedside debridement by ortho on 12/20. MRI showed edema and possible infectious sesamoiditis, otherwise normal. No evidence of osteo, infectious sesamoiditis, otherwise normal (Goal 10-15). Cultures ngtd. VT is therapeutic at 10 today. Will continue current dose. No doses were missed. SCr improved, 2.45, now down to 1.9.  Assessment/Plan: 1. Continue EI Zosyn 3.375 grams IV every 8 hours 2. Continue vancomycin 750 mg IV q24h 3. Await px micro data and narrow/deescalte antibiotics as feasible  Monitor clinical progress, c/s, renal function, abx plan/LOT Vanc levels as indicated  Temp (24hrs), Avg:98.4 F (36.9 C), Min:98 F (36.7 C), Max:98.7 F (37.1 C)   Recent Labs Lab 01/16/15 1227 01/17/15 0452 01/17/15 1620 01/19/15 0523 01/20/15 0650  WBC 16.9* 12.7* 11.8* 9.7 9.7     Recent Labs Lab 01/16/15 1227 01/17/15 0452 01/17/15 1620 01/19/15 0523 01/20/15 0650  CREATININE 2.45* 2.64* 2.54* 2.13* 1.90*   SCr ~ 1.3 in 2015  No Known Allergies  Antimicrobials this admission: Zosyn 12/19>> Vanc 12/19>>  Levels/dose changes this admission: VT 12/23: 10 on 750mg  q24h  Microbiology results: 12/19 BCx: ngtd 12/19: MRSA PCR: neg  Elicia Lamp, PharmD, BCPS Clinical Pharmacist Pager [phone] 01/20/2015 5:01 PM

## 2015-01-20 NOTE — Progress Notes (Signed)
Physical Therapy Treatment Patient Details Name: Marc [name] MRN: [id number] DOB: [birth year] Today's Date: 01/20/2015    History of Present Illness Pt is 74 y.o. male admitted with cellulitis of R LE. Bedside debridement of 2x3 cm wound at R 1st MT head. PMHx includes DM, PAD, HTN, gout, bradycardia    PT Comments    Pt OOB in recliner feeling "okay".  Applied R cast shoe to DM foot ulcer and assisted with amb a limited distance.  Very shuffled, slow gait with poor forward flex posture and delayed corrective response indicating HIGH FALL RISK. Limited amb distance.  Pt frustrated with D/C process of needing SNF and wants to go home but will not have 24/7 assist.      Follow Up Recommendations  SNF  if pt refuses will need 24/7 assist at home and [facility] PT   Equipment Recommendations       Recommendations for Other Services       Precautions / Restrictions Precautions Precautions: Fall Precaution Comments: R foot ulcer/cast shoe Restrictions Weight Bearing Restrictions: No Other Position/Activity Restrictions: No WBing restriction    Mobility  Bed Mobility               General bed mobility comments: pt OOB in recliner  Transfers Overall transfer level: Needs assistance   Transfers: Sit to/from Stand Sit to Stand: Min guard;Min assist         General transfer comment: required increased time and 25% VC's on safety with turns.  Incomplete stance with poor forward flex posture.    Ambulation/Gait Ambulation/Gait assistance: Min guard;Min assist Ambulation Distance (Feet): 20 Feet (10 feet x 2 ) Assistive device: Rolling walker (2 wheeled) Gait Pattern/deviations: Step-to pattern;Step-through pattern;Decreased step length - right;Decreased step length - left;Decreased stance time - right;Trunk flexed;Shuffle Gait velocity: very slow   General Gait Details: weak, unsteady gait with limited activity tolerance.  Each position change required a rest break.  Poor  forward flex posture and poor self corrective reaction response indicating HIGH FALL RISK.     Stairs            Wheelchair Mobility    Modified Rankin (Stroke Patients Only)       Balance                                    Cognition Arousal/Alertness: Awake/alert Behavior During Therapy: WFL for tasks assessed/performed Overall Cognitive Status: Within Functional Limits for tasks assessed                      Exercises      General Comments        Pertinent Vitals/Pain Pain Assessment: 0-10 Pain Score: 4  Pain Location: R foot Pain Descriptors / Indicators: Sore;Tender Pain Intervention(s): Monitored during session;Repositioned    Home Living                      Prior Function            PT Goals (current goals can now be found in the care plan section) Progress towards PT goals: Progressing toward goals    Frequency  Min 3X/week    PT Plan      Co-evaluation             End of Session Equipment Utilized During Treatment: Gait belt Activity Tolerance: Patient limited by fatigue Patient  left: in chair;with nursing/sitter in room;with call bell/phone within reach;with chair alarm set     Time: 1010-1038 PT Time Calculation (min) (ACUTE ONLY): 28 min  Charges:  $Gait Training: 8-22 mins $Therapeutic Activity: 8-22 mins                    G Codes:      Nathanial Rancher February 18, 2015, 12:12 PM

## 2015-01-20 NOTE — Progress Notes (Signed)
Hypoglycemic Event  CBG: 61  Treatment: 15 GM carbohydrate snack  Symptoms: None  Follow-up CBG: [id number] CBG Result:117  Possible Reasons for Event: Unknown  Comments/MD notified: Dr. Eileen Stanford

## 2015-01-20 NOTE — Progress Notes (Signed)
[facility] PROGRESS NOTE  Marc [name] [id number] DOB: 12-Feb-1940 DOA: 01/16/2015 PCP: [name], MD    Assessment/Plan: 1. Cellulitis of the right foot in the setting of gout flare and diabetic foot ulcer. X-ray of foot with soft tissue ulcer no evidence of osteomyelitis --Vancomycin and Zosyn per pharmacy from admission -OP doppler neg dvt - MR Iwith probably infectious sesamoiditis And ortho consult -ortho did bedside debridement 12-20 -continue with IV antibiotics. Continue daily dressing changes, orthopedics recommends discharge to SNF to help with dressing changes  2.Sepsis; with organ disfunction, renal failure.  - presents with leukocytosis,  elevated lactate, source likely  right foot infection. Blood culture no growth so far -Afebrile(tmax 100) with leukocytosis mildely elevated lactic, mild tachycardia acid and source -continue ivf/abx -blood culture no growth to date.   3. Diabetes. On oral agent. Related on admission likely related to recent prednisone use Continue sliding scale insulin -Hg A1c 8.0  4. Acute renal failure superimposed on chronic stage III. Baseline creatinine appears to be 1.30-1.4. 2.45> 119 -hold any nephrotoxins Continue IV fluids    5. CAD. No chest pain.  -discontinue telemetry  6. Hypertension. Somewhat soft in the emergency department -Fluids as noted above. -Will hold his home Lasix and his home lisinopril -Monitor  7. Gout. Pain partially related to gout? Reported just finished prednisone prior to admission - loaded with colchicine and provide daily dosage -Hold his home allopurinol due to cr elevation -Continue gabapentin, lower dose due to renal failure/  - uric acid 6.3  8 PA D. Status post iliac artery stent and is on Plavix -Outpatient Doppler negative for DVT -continue home meds  9 Hypoxia, likely from atelectasis, cxr +hypoventilation/atelectasis, incentive spirometer   Code Status: full  code.  Family Communication: care discussed with patient.  Disposition Plan: remain inpatient for IV antibiotics and improvement of renal function. SW consulted to help with SNF placement,.    Consultants:  Ortho, Dr [name].   Procedures:  Bedside debridement of right foot wound.   Antibiotics:  Vancomycin 01-16-2015  Zosyn 01-16-2015  HPI/Subjective: Stable overnight, complaining of foot pain   Objective: Filed Vitals:   01/19/15 2211 01/20/15 0614  BP: 119/60 119/50  Pulse: 44 96  Temp: 98.4 F (36.9 C) 98.7 F (37.1 C)  Resp: 18 16    Intake/Output Summary (Last 24 hours) at 01/20/15 1240 Last data filed at 01/20/15 1143  Gross per 24 hour  Intake 2492.5 ml  Output   3225 ml  Net -732.5 ml   Filed Weights   01/18/15 0554 01/19/15 0516 01/20/15 0548  Weight: 93.2 kg (205 lb 7.5 oz) 91.6 kg (201 lb 15.1 oz) 87.544 kg (193 lb)    Exam:   General:  Alert in no distress  Cardiovascular: S 1, S 2 RRR  Respiratory: CTA  Abdomen: Bs present, soft, nt  Musculoskeletal: Right foot with dressing.   Data Reviewed: Basic Metabolic Panel:  Recent Labs Lab 01/16/15 1227 01/17/15 0452 01/17/15 1620 01/19/15 0523 01/20/15 0650  NA 135 138 138 143 143  K 5.0 4.8 5.0 4.7 4.7  CL 100* 107 106 111 112*  CO2 23 24 25 23  21*  GLUCOSE 287* 95 180* 80 60*  BUN 72* 74* 66* 45* 36*  CREATININE 2.45* 2.64* 2.54* 2.13* 1.90*  CALCIUM 9.1 8.4* 8.1* 8.6* 8.7*   Liver Function Tests:  Recent Labs Lab 01/16/15 1227  AST 12*  ALT 6*  ALKPHOS 83  BILITOT 0.6  PROT 6.9  ALBUMIN 3.3*   No results for input(s): LIPASE, AMYLASE in the last 168 hours. No results for input(s): AMMONIA in the last 168 hours. CBC:  Recent Labs Lab 01/16/15 1227 01/17/15 0452 01/17/15 1620 01/19/15 0523 01/20/15 0650  WBC 16.9* 12.7* 11.8* 9.7 9.7  NEUTROABS 15.0*  --  9.4*  --   --   HGB 10.7* 8.7* 8.6* 9.1* 9.0*  HCT 32.9* 27.0* 26.5* 27.7* 28.5*  MCV 100.0 101.1*  102.3* 99.6 100.0  PLT 221 187 180 193 212   Cardiac Enzymes: No results for input(s): CKTOTAL, CKMB, CKMBINDEX, TROPONINI in the last 168 hours. BNP (last 3 results) No results for input(s): BNP in the last 8760 hours.  ProBNP (last 3 results) No results for input(s): PROBNP in the last 8760 hours.  CBG:  Recent Labs Lab 01/19/15 1723 01/19/15 2234 01/20/15 0807 01/20/15 0914 01/20/15 1141  GLUCAP 176* 108* 61* 117* 189*    Recent Results (from the past 240 hour(s))  Culture, blood (routine x 2)     Status: None (Preliminary result)   Collection Time: 01/16/15  1:55 PM  Result Value Ref Range Status   Specimen Description BLOOD LEFT ANTECUBITAL  Final   Special Requests BOTTLES DRAWN AEROBIC AND ANAEROBIC 5CC  Final   Culture NO GROWTH 4 DAYS  Final   Report Status PENDING  Incomplete  Blood culture (routine x 2)     Status: None (Preliminary result)   Collection Time: 01/16/15  2:00 PM  Result Value Ref Range Status   Specimen Description BLOOD LEFT FOREARM  Final   Special Requests BOTTLES DRAWN AEROBIC AND ANAEROBIC 5CC  Final   Culture NO GROWTH 4 DAYS  Final   Report Status PENDING  Incomplete     Studies: No results found.  Scheduled Meds: . clopidogrel  75 mg Oral Daily  . colchicine  0.6 mg Oral Daily  . collagenase   Topical Daily  . enoxaparin (LOVENOX) injection  40 mg Subcutaneous Q24H  . gabapentin  800 mg Oral 3 times per day  . glimepiride  4 mg Oral BID  . insulin aspart  0-15 Units Subcutaneous TID WC  . insulin aspart  0-5 Units Subcutaneous QHS  . pantoprazole  40 mg Oral Daily  . piperacillin-tazobactam (ZOSYN)  IV  3.375 g Intravenous 3 times per day  . polyethylene glycol  17 g Oral BID  . pravastatin  10 mg Oral q1800  . vancomycin  750 mg Intravenous Q24H   Continuous Infusions: . sodium chloride 75 mL/hr at 01/17/15 1534    Principal Problem:   Cellulitis Active Problems:   Hypertensive heart disease   Type 2 diabetes  mellitus with peripheral neuropathy (HCC)   Gout   Hyperlipidemia   GERD (gastroesophageal reflux disease)   CAD (coronary artery disease), native coronary artery   Acute renal failure superimposed on stage 3 chronic kidney disease (HCC)   SIRS (systemic inflammatory response syndrome) (HCC)   Acute on chronic kidney failure (HCC)   Type 2 diabetes mellitus with right diabetic foot infection ([place])    Time spent: 35 minutes.     [place] [facility] Pager [phone]. If 7PM-7AM, please contact night-coverage at [email or web address], password [facility] 01/20/2015, 12:40 PM  LOS: 4 days

## 2015-01-20 NOTE — Care Management Note (Addendum)
Case Management Note  Patient Details  Name: Marc [name] MRN: [id number] Date of Birth: Dec 30, 1940  Subjective/Objective:     Date: 01/20/15 Spoke with patient at the bedside.  Introduced self as Tourist information centre manager and explained role in discharge planning and how to be reached.  Verified patient lives in town, alone, but ex wife will be with him at discharge. Has  all the dme at home he needs , says patient. Expressed potential need for no other DME.  Verified patient anticipates to go home with family, at time of discharge and will have full-time  supervision by at this time to best of their knowledge.  Patient denied needing help with their medication.  Patient drives  to MD appointments.  Verified patient has PCP [employer].  Patient chose [facility] for [facility], PT, [place], aide and Education officer, museum.  Referral given to [facility] with [facility], soc will begin 24-48 hrs post dc.   Plan: CM will continue to follow for discharge planning and [facility] resources.                Action/Plan:   Expected Discharge Date:                  Expected Discharge Plan:  [place]  In-House Referral:     Discharge planning Services  CM Consult  Post Acute Care Choice:  Home Health Choice offered to:  Patient  DME Arranged:    DME Agency:     HH Arranged:  RN, PT, OT, Social Work [employer] Agency:  [place]  Status of Service:  Completed, signed off  Medicare Important Message Given:  Yes Date Medicare IM Given:    Medicare IM give by:    Date Additional Medicare IM Given:    Additional Medicare Important Message give by:     If discussed at [place] of Stay Meetings, dates discussed:    Additional Comments:  Zenon Mayo, RN 01/20/2015, 3:21 PM

## 2015-01-21 LAB — GLUCOSE, CAPILLARY
GLUCOSE-CAPILLARY: 83 mg/dL (ref 65–99)
GLUCOSE-CAPILLARY: 218 mg/dL — AB (ref 65–99)

## 2015-01-21 LAB — CULTURE, BLOOD (ROUTINE X 2)
CULTURE: NO GROWTH
Culture: NO GROWTH

## 2015-01-21 MED ORDER — HYDROMORPHONE HCL 1 MG/ML IJ SOLN
1.0000 mg | Freq: Once | INTRAMUSCULAR | Status: AC
  Administered 2015-01-21: 1 mg via INTRAVENOUS
  Filled 2015-01-21: qty 1

## 2015-01-21 MED ORDER — AMOXICILLIN-POT CLAVULANATE 875-125 MG PO TABS: 1.0000 | ORAL_TABLET | Freq: Two times a day (BID) | ORAL | Status: DC

## 2015-01-21 MED ORDER — COLCHICINE 0.6 MG PO TABS: 0.6000 mg | ORAL_TABLET | Freq: Every day | ORAL | Status: DC

## 2015-01-21 MED ORDER — LISINOPRIL 20 MG PO TABS: 20.0000 mg | ORAL_TABLET | Freq: Every day | ORAL | Status: DC

## 2015-01-21 MED ORDER — COLLAGENASE 250 UNIT/GM EX OINT: TOPICAL_OINTMENT | Freq: Every day | CUTANEOUS | Status: DC

## 2015-01-21 MED ORDER — OXYCODONE-ACETAMINOPHEN 5-325 MG PO TABS: 1.0000 | ORAL_TABLET | ORAL | Status: DC | PRN

## 2015-01-21 MED ORDER — PANTOPRAZOLE SODIUM 40 MG PO TBEC: 40.0000 mg | DELAYED_RELEASE_TABLET | Freq: Every day | ORAL | Status: DC

## 2015-01-21 MED ORDER — DOXYCYCLINE MONOHYDRATE 100 MG PO CAPS: 100.0000 mg | ORAL_CAPSULE | Freq: Two times a day (BID) | ORAL | Status: DC

## 2015-01-21 NOTE — Progress Notes (Signed)
Pt complains that pain medications are not effective for him. MD notified.

## 2015-01-21 NOTE — Progress Notes (Signed)
NCM spoke to pt and gave permission to contact wife, Finnick Perrilloux # [phone]. Contacted [facility] for scheduled dc home. Jonnie Finner RN CCM Case Mgmt phone [phone]

## 2015-01-21 NOTE — Discharge Summary (Signed)
Physician Discharge Summary  Marc [name] MRN: [id number] DOB/AGE: Jun 08, 1940 74 y.o.  PCP: [name], MD   Admit date: 01/16/2015 Discharge date: 01/21/2015     Principal Problem:   Cellulitis Active Problems:   Hypertensive heart disease   Type 2 diabetes mellitus with peripheral neuropathy (HCC)   Gout   Hyperlipidemia   GERD (gastroesophageal reflux disease)   CAD (coronary artery disease), native coronary artery   Acute renal failure superimposed on stage 3 chronic kidney disease (HCC)   SIRS (systemic inflammatory response syndrome) (HCC)   Acute on chronic kidney failure (HCC)   Type 2 diabetes mellitus with right diabetic foot infection ([place])    Follow-up recommendations Follow-up with PCP in 3-5 days , including all  additional recommended appointments as below Follow-up CBC, CMP in 3-5 days Declined SNF  [facility] for dressing changes      Medication List    STOP taking these medications        indomethacin 25 MG capsule  Commonly known as:  INDOCIN     predniSONE 10 MG tablet  Commonly known as:  DELTASONE      TAKE these medications        allopurinol 300 MG tablet  Commonly known as:  ZYLOPRIM  Take 300 mg by mouth daily.     amoxicillin-clavulanate 875-125 MG tablet  Commonly known as:  AUGMENTIN  Take 1 tablet by mouth 2 (two) times daily.     aspirin EC 81 MG tablet  Take 81 mg by mouth daily.     clopidogrel 75 MG tablet  Commonly known as:  PLAVIX  Take 1 tablet by mouth daily.     colchicine 0.6 MG tablet  Take 1 tablet (0.6 mg total) by mouth daily.     collagenase ointment  Commonly known as:  SANTYL  Apply topically daily.     doxycycline 100 MG capsule  Commonly known as:  MONODOX  Take 1 capsule (100 mg total) by mouth 2 (two) times daily.     furosemide 80 MG tablet  Commonly known as:  LASIX  Take 40 mg by mouth daily.     gabapentin 600 MG tablet  Commonly known as:  NEURONTIN  Take 1,200 mg by mouth  every 8 (eight) hours.     glimepiride 4 MG tablet  Commonly known as:  AMARYL  Take 4 mg by mouth 2 (two) times daily.     lisinopril 20 MG tablet  Commonly known as:  PRINIVIL,ZESTRIL  Take 1 tablet (20 mg total) by mouth daily.  Start taking on:  01/26/2015     lovastatin 40 MG tablet  Commonly known as:  MEVACOR  Take 40 mg by mouth at bedtime.     omeprazole 20 MG tablet  Commonly known as:  PRILOSEC OTC  Take 40 mg by mouth daily.     oxyCODONE-acetaminophen 5-325 MG tablet  Commonly known as:  PERCOCET/ROXICET  Take 1 tablet by mouth every 4 (four) hours as needed for moderate pain or severe pain.     pantoprazole 40 MG tablet  Commonly known as:  PROTONIX  Take 1 tablet (40 mg total) by mouth daily.         Discharge Condition: Stable  Discharge Instructions       Discharge Instructions    Diet - low sodium heart healthy    Complete by:  As directed      Increase activity slowly    Complete by:  As  directed            No Known Allergies    Disposition: 01-Home or Self Care   Consults:  Orthopedics     Significant Diagnostic Studies:  US Renal  01/17/2015  CLINICAL DATA:  Acute renal failure EXAM: RENAL / URINARY TRACT ULTRASOUND COMPLETE COMPARISON:  04/14/2014 FINDINGS: Right Kidney: Length: 11.8 cm. Normal echogenicity. No hydronephrosis. No renal calculus. Again noted a lower pole cyst measures 1.4 x 1.2 cm. On the prior exam measures 1.5 x 1 cm. Left Kidney: Length: 11.3 cm length. Echogenicity within normal limits. No mass or hydronephrosis visualized. Bladder: Appears normal for degree of bladder distention. Prostate gland measures 4.2 x 3.8 x 5.3 cm with mild indentation of urinary bladder base. IMPRESSION: 1. No hydronephrosis.  No renal calculi. 2. Stable right renal lower pole cyst measures 1.4 x 1.2 cm. 3. Mild enlarged prostate gland with indentation of urinary bladder base measures 5.3 x 4.2 cm. Electronically Signed   By: Lahoma Crocker M.D.   On: 01/17/2015 11:04   Mr Foot Right Wo Contrast  01/17/2015  CLINICAL DATA:  Progressive cellulitis with pain and redness and swelling of the right foot. Diabetic foot ulcer at the plantar aspect of the great toe. Gout. EXAM: MRI OF THE RIGHT FOREFOOT WITHOUT CONTRAST TECHNIQUE: Multiplanar, multisequence MR imaging was performed. No intravenous contrast was administered. COMPARISON:  Radiographs dated 01/16/2015 FINDINGS: There is subtle edema and slight irregularity of the medial sesamoid adjacent to the head of the first metatarsal immediately deep to the plantar ulcer. The other bones of the forefoot appear normal. There is no abscess. There is prominent subcutaneous edema on the dorsum of the foot. The tendons and muscles demonstrate no significant abnormalities. No joint effusions. IMPRESSION: Edema and irregularity of the medial sesamoid at the head of the first metatarsal just deep to the soft tissue ulcer. This could represent infectious sesamoiditis. Otherwise, normal exam. Electronically Signed   By: Lorriane Shire M.D.   On: 01/17/2015 11:58   Dg Chest Port 1 View  01/17/2015  CLINICAL DATA:  Hypoxia EXAM: PORTABLE CHEST 1 VIEW COMPARISON:  07/26/2014 FINDINGS: Hypoventilation with decreased lung volume compared with the prior study. Mild bibasilar atelectasis. Negative for heart failure or effusion. IMPRESSION: Hypoventilation with mild bibasilar atelectasis. Electronically Signed   By: Franchot Gallo M.D.   On: 01/17/2015 14:13   Dg Foot Complete Right  01/16/2015  CLINICAL DATA:  Pain over the entire foot. EXAM: RIGHT FOOT COMPLETE - 3+ VIEW COMPARISON:  None. FINDINGS: There is generalized osteopenia. There is no acute fracture or dislocation. There is mild degenerative changes of the first MTP joint. There is a soft tissue ulcer overlying the medial aspect of the first MTP joint with soft tissue emphysema. No cortical destruction or periosteal reaction. IMPRESSION: Soft  tissue ulcer along the medial aspect of the first MTP joint with mild soft tissue emphysema. No evidence of osteomyelitis of the right foot. Electronically Signed   By: Kathreen Devoid   On: 01/16/2015 13:01       Filed Weights   01/19/15 0516 01/20/15 0548 01/21/15 0544  Weight: 91.6 kg (201 lb 15.1 oz) 87.544 kg (193 lb) 91.2 kg (201 lb 1 oz)     Microbiology: Recent Results (from the past 240 hour(s))  Culture, blood (routine x 2)     Status: None   Collection Time: 01/16/15  1:55 PM  Result Value Ref Range Status   Specimen Description BLOOD LEFT  ANTECUBITAL  Final   Special Requests BOTTLES DRAWN AEROBIC AND ANAEROBIC 5CC  Final   Culture NO GROWTH 5 DAYS  Final   Report Status 01/21/2015 FINAL  Final  Blood culture (routine x 2)     Status: None   Collection Time: 01/16/15  2:00 PM  Result Value Ref Range Status   Specimen Description BLOOD LEFT FOREARM  Final   Special Requests BOTTLES DRAWN AEROBIC AND ANAEROBIC 5CC  Final   Culture NO GROWTH 5 DAYS  Final   Report Status 01/21/2015 FINAL  Final       Blood Culture    Component Value Date/Time   SDES BLOOD LEFT FOREARM 01/16/2015 1400   SPECREQUEST BOTTLES DRAWN AEROBIC AND ANAEROBIC 5CC 01/16/2015 1400   CULT NO GROWTH 5 DAYS 01/16/2015 1400   REPTSTATUS 01/21/2015 FINAL 01/16/2015 1400      Labs: Results for orders placed or performed during the hospital encounter of 01/16/15 (from the past 48 hour(s))  Glucose, capillary     Status: Abnormal   Collection Time: 01/19/15  5:23 PM  Result Value Ref Range   Glucose-Capillary 176 (H) 65 - 99 mg/dL  Glucose, capillary     Status: Abnormal   Collection Time: 01/19/15 10:34 PM  Result Value Ref Range   Glucose-Capillary 108 (H) 65 - 99 mg/dL  CBC     Status: Abnormal   Collection Time: 01/20/15  6:50 AM  Result Value Ref Range   WBC 9.7 4.0 - 10.5 K/uL   RBC 2.85 (L) 4.22 - 5.81 MIL/uL   Hemoglobin 9.0 (L) 13.0 - 17.0 g/dL   HCT 28.5 (L) 39.0 - 52.0 %   MCV  100.0 78.0 - 100.0 fL   MCH 31.6 26.0 - 34.0 pg   MCHC 31.6 30.0 - 36.0 g/dL   RDW 14.0 11.5 - 15.5 %   Platelets 212 150 - 400 K/uL  Basic metabolic panel     Status: Abnormal   Collection Time: 01/20/15  6:50 AM  Result Value Ref Range   Sodium 143 135 - 145 mmol/L   Potassium 4.7 3.5 - 5.1 mmol/L   Chloride 112 (H) 101 - 111 mmol/L   CO2 21 (L) 22 - 32 mmol/L   Glucose, Bld 60 (L) 65 - 99 mg/dL   BUN 36 (H) 6 - 20 mg/dL   Creatinine, Ser 1.90 (H) 0.61 - 1.24 mg/dL   Calcium 8.7 (L) 8.9 - 10.3 mg/dL   GFR calc non Af Amer 33 (L) >60 mL/min   GFR calc Af Amer 38 (L) >60 mL/min    Comment: (NOTE) The eGFR has been calculated using the CKD EPI equation. This calculation has not been validated in all clinical situations. eGFR's persistently <60 mL/min signify possible Chronic Kidney Disease.    Anion gap 10 5 - 15  Glucose, capillary     Status: Abnormal   Collection Time: 01/20/15  8:07 AM  Result Value Ref Range   Glucose-Capillary 61 (L) 65 - 99 mg/dL  Glucose, capillary     Status: Abnormal   Collection Time: 01/20/15  9:14 AM  Result Value Ref Range   Glucose-Capillary 117 (H) 65 - 99 mg/dL  Glucose, capillary     Status: Abnormal   Collection Time: 01/20/15 11:41 AM  Result Value Ref Range   Glucose-Capillary 189 (H) 65 - 99 mg/dL  Vancomycin, trough     Status: None   Collection Time: 01/20/15  4:05 PM  Result Value Ref Range  Vancomycin Tr 10 10.0 - 20.0 ug/mL  Glucose, capillary     Status: Abnormal   Collection Time: 01/20/15  4:10 PM  Result Value Ref Range   Glucose-Capillary 128 (H) 65 - 99 mg/dL  Glucose, capillary     Status: None   Collection Time: 01/20/15 10:00 PM  Result Value Ref Range   Glucose-Capillary 97 65 - 99 mg/dL   Comment 1 Notify RN    Comment 2 Document in Chart   Glucose, capillary     Status: None   Collection Time: 01/21/15  7:59 AM  Result Value Ref Range   Glucose-Capillary 83 65 - 99 mg/dL  Glucose, capillary     Status:  Abnormal   Collection Time: 01/21/15 11:47 AM  Result Value Ref Range   Glucose-Capillary 218 (H) 65 - 99 mg/dL     Lipid Panel  No results found for: CHOL, TRIG, HDL, CHOLHDL, VLDL, LDLCALC, LDLDIRECT   Lab Results  Component Value Date   HGBA1C 8.0* 01/16/2015   HGBA1C 6.7* 02/03/2013     Lab Results  Component Value Date   CREATININE 1.90* 01/20/2015     HPI :74 y.o. male.  HPI: Patient reports 1 week history of gout pain in his right foot. Reports PCP ordered prednisone. Which he he reports completing. 3 days ago he developed gradual worsening swelling erythema and pain to the right foot. Pain is described as sharp and tense and "spastic-like" located great toe joint. Associated symptoms include chills without subjective fever nausea without vomiting decreased oral intake. In addition he has a diabetic foot ulcer at the tip of the left great toe 1 cm in diameter that is chronic. When he was admitted to the hospital he was noted to have a 2 x 3 cm area of eschar over the medial aspect of the MTP joint he denies any specific injury, he lives alone and there is no one to check his foot on a daily basis. He has a iliac stent and a history of significant peripheral vascular disease.   HOSPITAL COURSE:  1. Cellulitis of the right foot in the setting of gout flare and diabetic foot ulcer. X-ray of foot with soft tissue ulcer no evidence of osteomyelitis --Vancomycin and Zosyn per pharmacy from admission -OP doppler neg dvt - MR Iwith probably infectious sesamoiditis And ortho consulted, Debrided at the bedside today down to potentially viable tissue -ortho did bedside debridement 12-20, Received  IV antibiotics 12/ 19-12/24 .  Continue daily dressing changes, orthopedics recommends discharge to SNF to help with dressing changes Patient declines, angry and cursing out, wants to go home  Switched to augmentin  and doxycycline   2.Sepsis; with organ disfunction, renal failure.   - presents with leukocytosis, elevated lactate, source likely right foot infection. Blood culture no growth so far -Afebrile(tmax 100) with leukocytosis mildely elevated lactic, mild tachycardia acid and source -continue ivf/abx -blood culture no growth to date.   3. Diabetes. On oral agent. Related on admission likely related to recent prednisone use Resume glipizide  -Hg A1c 8.0  4. Acute renal failure superimposed on chronic stage III. Baseline creatinine appears to be 1.30-1.4. 2.45> 119 -hold any nephrotoxins Resume lisinopril if renal fn stable next week   5. CAD. No chest pain.  -discontinue telemetry  6. Hypertension. Somewhat soft in the emergency department -Fluids as noted above. -Will hold his home Lasix and his home lisinopril until; next week -Monitor  7. Gout. Pain partially related to gout? Reported  just finished prednisone prior to admission Cont colchicine for 10 days  -Hold his home allopurinol due to cr elevation -Continue gabapentin, lower dose due to renal failure/  - uric acid 6.3  8 PA D. Status post iliac artery stent and is on Plavix -Outpatient Doppler negative for DVT -continue home meds  9 Hypoxia, likely from atelectasis, cxr +hypoventilation/atelectasis, incentive spirometer      Discharge Exam:   Blood pressure 127/49, pulse 100, temperature 98.9 F (37.2 C), temperature source Oral, resp. rate 20, height 6' 1"  (1.854 m), weight 91.2 kg (201 lb 1 oz), SpO2 95 %.   General: Alert in no distress  Cardiovascular: S 1, S 2 RRR  Respiratory: CTA  Abdomen: Bs present, soft, nt  Musculoskeletal: Right foot with dressing    Follow-up Information    Follow up with [facility].   Why:  HHRN, PT, OT, aide and social worker   Contact information:   [address] [place] [address] [phone]       Follow up with [name], MD. Schedule an appointment as soon as possible for a visit in 3  days.   Specialty:  Family Medicine   Contact information:   [place] [address] [phone]       Signed: Reyne Dumas 01/21/2015, 1:32 PM        Time spent >45 mins

## 2015-01-21 NOTE — Progress Notes (Signed)
01/21/15 Patient discharged home today, IV site removed and discharge instructions reviewed with patient and wife.

## 2015-01-31 ENCOUNTER — Other Ambulatory Visit: Payer: Self-pay | Admitting: *Deleted

## 2015-01-31 DIAGNOSIS — I7025 Atherosclerosis of native arteries of other extremities with ulceration: Secondary | ICD-10-CM

## 2015-02-01 ENCOUNTER — Ambulatory Visit (HOSPITAL_COMMUNITY)
Admission: RE | Admit: 2015-02-01 | Discharge: 2015-02-01 | Disposition: A | Discharge status: Home / Self Care | Payer: Medicare Other | Source: Ambulatory Visit | Attending: Vascular Surgery | Admitting: Vascular Surgery

## 2015-02-01 ENCOUNTER — Ambulatory Visit (INDEPENDENT_AMBULATORY_CARE_PROVIDER_SITE_OTHER): Payer: Medicare Other | Admitting: Vascular Surgery

## 2015-02-01 ENCOUNTER — Encounter: Payer: Self-pay | Admitting: Vascular Surgery

## 2015-02-01 ENCOUNTER — Other Ambulatory Visit: Payer: Self-pay

## 2015-02-01 VITALS — BP 142/80 | HR 59 | Temp 97.7°F | Resp 16 | Ht 73.0 in | Wt 190.0 lb

## 2015-02-01 DIAGNOSIS — I7025 Atherosclerosis of native arteries of other extremities with ulceration: Secondary | ICD-10-CM | POA: Diagnosis present

## 2015-02-01 DIAGNOSIS — I739 Peripheral vascular disease, unspecified: Secondary | ICD-10-CM | POA: Diagnosis not present

## 2015-02-01 DIAGNOSIS — F17200 Nicotine dependence, unspecified, uncomplicated: Secondary | ICD-10-CM | POA: Diagnosis not present

## 2015-02-01 DIAGNOSIS — I70261 Atherosclerosis of native arteries of extremities with gangrene, right leg: Secondary | ICD-10-CM

## 2015-02-01 DIAGNOSIS — I10 Essential (primary) hypertension: Secondary | ICD-10-CM | POA: Diagnosis not present

## 2015-02-01 DIAGNOSIS — E7800 Pure hypercholesterolemia, unspecified: Secondary | ICD-10-CM | POA: Diagnosis not present

## 2015-02-01 DIAGNOSIS — E119 Type 2 diabetes mellitus without complications: Secondary | ICD-10-CM | POA: Insufficient documentation

## 2015-02-01 NOTE — Progress Notes (Signed)
HISTORY AND PHYSICAL     CC:  Non healing wound Referring Provider:  [name], *  HPI: This is a 75 y.o. male who saw Dr. Scot Dock earlier this year for PAD.  At that time, he had underwent a lower extremity vascular study which showed evidence of an occlusion of his right distal femoral artery.    He was noted to have claudication, and he continues to have this and it has not changed since previous visit.  He does have a hx of bilateral iliac stenting by Dr. Gwenlyn Found in the past.   He states that the week before Christmas, he was having swelling in his right foot, which got better.  This happened twice.  Then it swelled again without relief.  He thought this was gout.  He did take anti-inflammatories without relief.   He states that he got a crack in his foot and it has progressed to a larger wound.  He states that he is diabetic.  He is having pain and redness in his right foot. He subsequently got admitted to the hospital and had IV abx.  He continues to be on a po antibiotic.  Ortho did debride his foot while in the hospital.    He is on amaryl for his DM.  He is on an ACEI for BP management.  He is on a statin and baby aspirin.    Past Medical History  Diagnosis Date  . Hypertension   . Ulcer disease   . Colon polyp   . Gout   . Bradycardia   . Hypercholesteremia   . PAD (peripheral artery disease) ([place])   . Chronic bronchitis ([place])   . Type II diabetes mellitus ([place])   . History of GI bleed 2007  . Gout   . Tobacco abuse disorder 03/10/2012  . GERD (gastroesophageal reflux disease)   . Pneumonia     denies  . ARF (acute renal failure) ([place]) 12/2014    Past Surgical History  Procedure Laterality Date  . Splenectomy  ~ 1957  . Anterior cervical decomp/discectomy fusion  ~ 2007  . Upper endoscopy w/ sclerotherapy  ~ 2007  . Polypectomy    . Colonoscopy    . Iliac artery stent Left     left common/notes (02/04/2012)  . Hernia repair  ~ 2007    UHR (02/04/2012)  .  Tonsillectomy  ~ 1947  . Lumbar laminectomy  11/14  . Total knee arthroplasty Right 02/03/2013    Procedure: TOTAL KNEE ARTHROPLASTY;  Surgeon: [name], MD;  Location: [place];  Service: Orthopedics;  Laterality: Right;  . Lower extremity angiogram N/A 02/04/2012    Procedure: LOWER EXTREMITY ANGIOGRAM;  Surgeon: Lorretta Harp, MD;  Location: [facility] CATH LAB;  Service: Cardiovascular;  Laterality: N/A;  . Percutaneous stent intervention Left 02/04/2012    Procedure: PERCUTANEOUS STENT INTERVENTION;  Surgeon: Lorretta Harp, MD;  Location: [facility] CATH LAB;  Service: Cardiovascular;  Laterality: Left;  lt ext iliac stent  . Replacement total knee Right   . Back surgery    . Anterior lat lumbar fusion Left 01/19/2014    Procedure: LATERAL INTERBODY FUSION 1 LEVEL;  Surgeon: Sinclair Ship, MD;  Location: [place];  Service: Orthopedics;  Laterality: Left;  Left lumbar 3-4 lateral interbody fusion with instrumentation, allograft    No Known Allergies  Current Outpatient Prescriptions  Medication Sig Dispense Refill  . allopurinol (ZYLOPRIM) 300 MG tablet Take 300 mg by mouth daily.    [name]  amoxicillin-clavulanate (AUGMENTIN) 875-125 MG tablet Take 1 tablet by mouth 2 (two) times daily. 10 tablet 0  . aspirin EC 81 MG tablet Take 81 mg by mouth daily.    . clopidogrel (PLAVIX) 75 MG tablet Take 1 tablet by mouth daily.    . colchicine 0.6 MG tablet Take 1 tablet (0.6 mg total) by mouth daily. 10 tablet 0  . collagenase (SANTYL) ointment Apply topically daily. 15 g 0  . doxycycline (MONODOX) 100 MG capsule Take 1 capsule (100 mg total) by mouth 2 (two) times daily. 20 capsule 0  . furosemide (LASIX) 80 MG tablet Take 40 mg by mouth daily.     [name] gabapentin (NEURONTIN) 600 MG tablet Take 1,200 mg by mouth every 8 (eight) hours.     [name] glimepiride (AMARYL) 4 MG tablet Take 4 mg by mouth 2 (two) times daily.     [name] lisinopril (PRINIVIL,ZESTRIL) 20 MG tablet Take 1 tablet (20 mg total) by mouth daily. 30  tablet 0  . lovastatin (MEVACOR) 40 MG tablet Take 40 mg by mouth at bedtime.    [name] omeprazole (PRILOSEC OTC) 20 MG tablet Take 40 mg by mouth daily.     [name] oxyCODONE-acetaminophen (PERCOCET/ROXICET) 5-325 MG tablet Take 1 tablet by mouth every 4 (four) hours as needed for moderate pain or severe pain. 30 tablet 0  . pantoprazole (PROTONIX) 40 MG tablet Take 1 tablet (40 mg total) by mouth daily. 30 tablet 0   No current facility-administered medications for this visit.    Family History  Problem Relation Age of Onset  . Colon cancer Paternal Uncle     Uncle  . Heart disease Father   . Hypertension Father   . Heart attack Father   . Heart disease Mother   . Diabetes Mother   . Hypertension Mother     Social History   Social History  . Marital Status: Legally Separated    Spouse Name: N/A  . Number of Children: 2  . Years of Education: N/A   Occupational History  . retired   . cattle farm     owner   Social History Main Topics  . Smoking status: Current Every Day Smoker -- 2.00 packs/day for 58 years    Types: Cigarettes  . Smokeless tobacco: Former Systems developer    Types: Chew  . Alcohol Use: 0.0 oz/week    0 Standard drinks or equivalent per week     Comment:  quit for 22 yr; egg nog christmas  . Drug Use: No  . Sexual Activity: No   Other Topics Concern  . Not on file   Social History Narrative   Patient lives at home with his spouse.   Caffeine Use: 2 cups daily     ROS: [x]  Positive   [ ]  Negative   [ ]  All sytems reviewed and are negative  Cardiovascular: []  chest pain/pressure []  palpitations []  SOB lying flat []  DOE [x]  pain in legs while walking [x]  pain in feet when lying flat []  hx of DVT []  hx of phlebitis [x]  swelling in legs []  varicose veins  Pulmonary: []  productive cough []  asthma []  wheezing  Neurologic: []  weakness in []  arms []  legs []  numbness in []  arms []  legs [] difficulty speaking or slurred speech []  temporary loss of vision in  one eye []  dizziness  Hematologic: []  bleeding problems [x]  problems with blood clotting easily  GI []  vomiting blood []  blood in stool  GU: []  burning with  urination []  blood in urine  Psychiatric: []  hx of major depression  Integumentary: []  rashes []  ulcers  Constitutional: []  fever []  chills   PHYSICAL EXAMINATION:  Filed Vitals:   02/01/15 1456 02/01/15 1505  BP: 158/76 142/80  Pulse: 58 59  Temp: 97.7 F (36.5 C)   Resp: 16    Body mass index is 25.07 kg/(m^2).  General:  WDWN in NAD Gait: Not observed HENT: WNL, normocephalic Pulmonary: normal non-labored breathing , without Rales, rhonchi,  wheezing Cardiac: RRR, without  Murmurs, rubs or gallops; without carotid bruits Abdomen: soft, NT, no masses Skin: without rashes Vascular Exam/Pulses: He has a 4cmx5cm on the ball of the right foot with tendon exposed.  There is eschar on the tip of the right great toe; the right foot is with edema and erythema. Extremities: non palpable pedal pulses Musculoskeletal: no muscle wasting or atrophy  Neurologic: A&O X 3; Appropriate Affect ; SENSATION: normal; MOTOR FUNCTION:  moving all extremities equally. Speech is fluent/normal   Non-Invasive Vascular Imaging:   ABI's 02/01/15: Right:  0.42 Left:  0.62  Pt meds includes: Statin:  Yes.   Beta Blocker:  No. Aspirin:  Yes.   ACEI:  Yes.   ARB:  No. Other Antiplatelet/Anticoagulant:  Yes.   Plavix   ASSESSMENT/PLAN:: 75 y.o. male with hx of PAD now with limb threatening wound on the right foot.   -will plan for aortogram on Friday 02/03/15 with possible intervention.  If unable to intervene, will plan for right leg bypass on Tuesday 02/07/15.  -Dr. Scot Dock did discuss with the pt that this is limb threatening. -pt will continue his po abx.   Leontine Locket, PA-C [facility] [phone]  Clinic MD:  Pt seen and examined in conjunction with Dr. Scot Dock  Agree with above. This patient  has a very extensive wound involving the plantar aspect of his right great toe with exposed tendon. In addition he has evidence of multilevel arterial occlusive disease and diabetes. This is clearly a limb threatening situation. He is also a smoker. I think his only chance for limb salvage would be to proceed with arteriography in order to determine if he has any options for revascularization. I have reviewed with the patient the indications for arteriography. In addition, I have reviewed the potential complications of arteriography including but not limited to: Bleeding, arterial injury, arterial thrombosis, dye action, renal insufficiency, or other unpredictable medical problems. I have explained to the patient that if we find disease amenable to angioplasty we could potentially address this at the same time. I have discussed the potential complications of angioplasty and stenting, including but not limited to: Bleeding, arterial thrombosis, arterial injury, dissection, or the need for surgical intervention.  His arteriogram is scheduled for tomorrow, 02/02/2015. I tentatively have him scheduled for a right lower extremity bypass on 02/07/2015 if he is not a candidate for an endovascular approach. If he does not have any options for revascularization he would likely require a below-the-knee amputation. The patient has made it clear to me that he he wants the most definitive procedure if possible. In addition, although he has previously had arteriography by Dr. Quay Burow patient has requested that I do his arteriogram.  Deitra Mayo, MD, FACS [facility] [phone] Office: [phone]

## 2015-02-02 ENCOUNTER — Observation Stay (HOSPITAL_COMMUNITY)
Admission: RE | Admit: 2015-02-02 | Discharge: 2015-02-03 | Disposition: A | Discharge status: Home / Self Care | Payer: Medicare Other | Source: Ambulatory Visit | Attending: Vascular Surgery | Admitting: Vascular Surgery

## 2015-02-02 ENCOUNTER — Encounter (HOSPITAL_COMMUNITY)
Admission: RE | Disposition: A | Discharge status: Home / Self Care | Payer: Self-pay | Source: Ambulatory Visit | Attending: Vascular Surgery

## 2015-02-02 ENCOUNTER — Encounter (HOSPITAL_COMMUNITY): Payer: Self-pay | Admitting: *Deleted

## 2015-02-02 DIAGNOSIS — F17210 Nicotine dependence, cigarettes, uncomplicated: Secondary | ICD-10-CM | POA: Diagnosis not present

## 2015-02-02 DIAGNOSIS — E7800 Pure hypercholesterolemia, unspecified: Secondary | ICD-10-CM | POA: Insufficient documentation

## 2015-02-02 DIAGNOSIS — T82856A Stenosis of peripheral vascular stent, initial encounter: Secondary | ICD-10-CM | POA: Insufficient documentation

## 2015-02-02 DIAGNOSIS — E119 Type 2 diabetes mellitus without complications: Secondary | ICD-10-CM | POA: Diagnosis not present

## 2015-02-02 DIAGNOSIS — Z7982 Long term (current) use of aspirin: Secondary | ICD-10-CM | POA: Insufficient documentation

## 2015-02-02 DIAGNOSIS — I10 Essential (primary) hypertension: Secondary | ICD-10-CM | POA: Diagnosis not present

## 2015-02-02 DIAGNOSIS — Z7984 Long term (current) use of oral hypoglycemic drugs: Secondary | ICD-10-CM | POA: Insufficient documentation

## 2015-02-02 DIAGNOSIS — I70261 Atherosclerosis of native arteries of extremities with gangrene, right leg: Secondary | ICD-10-CM | POA: Diagnosis not present

## 2015-02-02 DIAGNOSIS — I70269 Atherosclerosis of native arteries of extremities with gangrene, unspecified extremity: Secondary | ICD-10-CM | POA: Diagnosis present

## 2015-02-02 DIAGNOSIS — E1152 Type 2 diabetes mellitus with diabetic peripheral angiopathy with gangrene: Secondary | ICD-10-CM | POA: Insufficient documentation

## 2015-02-02 HISTORY — PX: PERIPHERAL VASCULAR CATHETERIZATION: SHX172C

## 2015-02-02 LAB — BASIC METABOLIC PANEL
Anion gap: 7 (ref 5–15)
BUN: 46 mg/dL — AB (ref 6–20)
CALCIUM: 9.1 mg/dL (ref 8.9–10.3)
CHLORIDE: 108 mmol/L (ref 101–111)
CO2: 25 mmol/L (ref 22–32)
CREATININE: 1.72 mg/dL — AB (ref 0.61–1.24)
GFR calc non Af Amer: 37 mL/min — ABNORMAL LOW (ref >60)
GFR, EST AFRICAN AMERICAN: 43 mL/min — AB (ref >60)
GLUCOSE: 186 mg/dL — AB (ref 65–99)
Potassium: 6.3 mmol/L (ref 3.5–5.1)
Sodium: 140 mmol/L (ref 135–145)

## 2015-02-02 LAB — POCT I-STAT, CHEM 8
BUN: 45 mg/dL — AB (ref 6–20)
BUN: 38 mg/dL — AB (ref 6–20)
CALCIUM ION: 1.21 mmol/L (ref 1.13–1.30)
CREATININE: 1.5 mg/dL — AB (ref 0.61–1.24)
CREATININE: 1.2 mg/dL (ref 0.61–1.24)
Calcium, Ion: 1.24 mmol/L (ref 1.13–1.30)
Chloride: 106 mmol/L (ref 101–111)
Chloride: 109 mmol/L (ref 101–111)
GLUCOSE: 183 mg/dL — AB (ref 65–99)
GLUCOSE: 127 mg/dL — AB (ref 65–99)
HEMATOCRIT: 32 % — AB (ref 39.0–52.0)
HEMATOCRIT: 31 % — AB (ref 39.0–52.0)
HEMOGLOBIN: 10.9 g/dL — AB (ref 13.0–17.0)
Hemoglobin: 10.5 g/dL — ABNORMAL LOW (ref 13.0–17.0)
Potassium: 6.2 mmol/L (ref 3.5–5.1)
Potassium: 4.7 mmol/L (ref 3.5–5.1)
Sodium: 139 mmol/L (ref 135–145)
Sodium: 142 mmol/L (ref 135–145)
TCO2: 23 mmol/L (ref 0–100)
TCO2: 24 mmol/L (ref 0–100)

## 2015-02-02 LAB — GLUCOSE, CAPILLARY
GLUCOSE-CAPILLARY: 170 mg/dL — AB (ref 65–99)
Glucose-Capillary: 46 mg/dL — ABNORMAL LOW (ref 65–99)
Glucose-Capillary: 287 mg/dL — ABNORMAL HIGH (ref 65–99)

## 2015-02-02 SURGERY — ABDOMINAL AORTOGRAM W/LOWER EXTREMITY

## 2015-02-02 MED ORDER — SODIUM CHLORIDE 0.9 % IV SOLN: 1.0000 mL/kg/h | INTRAVENOUS | Status: AC

## 2015-02-02 MED ORDER — SODIUM CHLORIDE 0.9 % IV SOLN
INTRAVENOUS | Status: DC
  Administered 2015-02-02: 11:00:00 via INTRAVENOUS

## 2015-02-02 MED ORDER — DEXTROSE 50 % IV SOLN
INTRAVENOUS | Status: DC | PRN
  Administered 2015-02-02: 1 via INTRAVENOUS

## 2015-02-02 MED ORDER — MIDAZOLAM HCL 2 MG/2ML IJ SOLN
INTRAMUSCULAR | Status: DC | PRN
  Administered 2015-02-02: 1 mg via INTRAVENOUS

## 2015-02-02 MED ORDER — AMOXICILLIN-POT CLAVULANATE 875-125 MG PO TABS
1.0000 | ORAL_TABLET | Freq: Two times a day (BID) | ORAL | Status: DC
Start: 2015-02-02 — End: 2015-02-03
  Administered 2015-02-02: 1 via ORAL
  Filled 2015-02-02: qty 1

## 2015-02-02 MED ORDER — DEXTROSE 50 % IV SOLN
INTRAVENOUS | Status: AC
  Filled 2015-02-02: qty 50

## 2015-02-02 MED ORDER — FENTANYL CITRATE (PF) 100 MCG/2ML IJ SOLN
INTRAMUSCULAR | Status: DC | PRN
  Administered 2015-02-02: 50 ug via INTRAVENOUS

## 2015-02-02 MED ORDER — CLOPIDOGREL BISULFATE 75 MG PO TABS
75.0000 mg | ORAL_TABLET | Freq: Every day | ORAL | Status: DC
  Administered 2015-02-02: 75 mg via ORAL
  Filled 2015-02-02: qty 1

## 2015-02-02 MED ORDER — PANTOPRAZOLE SODIUM 40 MG PO TBEC: 40.0000 mg | DELAYED_RELEASE_TABLET | Freq: Every day | ORAL | Status: DC

## 2015-02-02 MED ORDER — LISINOPRIL 10 MG PO TABS: 20.0000 mg | ORAL_TABLET | Freq: Every day | ORAL | Status: DC

## 2015-02-02 MED ORDER — PREDNISONE 20 MG PO TABS: 20.0000 mg | ORAL_TABLET | Freq: Every day | ORAL | Status: DC

## 2015-02-02 MED ORDER — GLIMEPIRIDE 4 MG PO TABS
4.0000 mg | ORAL_TABLET | Freq: Two times a day (BID) | ORAL | Status: DC
  Administered 2015-02-02: 4 mg via ORAL
  Filled 2015-02-02: qty 1

## 2015-02-02 MED ORDER — LIDOCAINE HCL (PF) 1 % IJ SOLN
INTRAMUSCULAR | Status: DC | PRN
  Administered 2015-02-02: 17:00:00

## 2015-02-02 MED ORDER — ACETAMINOPHEN 325 MG PO TABS: 650.0000 mg | ORAL_TABLET | ORAL | Status: DC | PRN

## 2015-02-02 MED ORDER — ALLOPURINOL 300 MG PO TABS
300.0000 mg | ORAL_TABLET | Freq: Two times a day (BID) | ORAL | Status: DC
  Administered 2015-02-02: 300 mg via ORAL
  Filled 2015-02-02: qty 1

## 2015-02-02 MED ORDER — MIDAZOLAM HCL 2 MG/2ML IJ SOLN
INTRAMUSCULAR | Status: AC
  Filled 2015-02-02: qty 2

## 2015-02-02 MED ORDER — HEPARIN (PORCINE) IN NACL 2-0.9 UNIT/ML-% IJ SOLN
INTRAMUSCULAR | Status: AC
  Filled 2015-02-02: qty 1000

## 2015-02-02 MED ORDER — LIDOCAINE HCL (PF) 1 % IJ SOLN
INTRAMUSCULAR | Status: AC
  Filled 2015-02-02: qty 30

## 2015-02-02 MED ORDER — ATORVASTATIN CALCIUM 10 MG PO TABS
10.0000 mg | ORAL_TABLET | Freq: Every day | ORAL | Status: DC
  Administered 2015-02-02: 10 mg via ORAL
  Filled 2015-02-02: qty 1

## 2015-02-02 MED ORDER — INSULIN ASPART 100 UNIT/ML ~~LOC~~ SOLN
12.0000 [IU] | Freq: Once | SUBCUTANEOUS | Status: AC
  Administered 2015-02-02: 12 [IU] via SUBCUTANEOUS

## 2015-02-02 MED ORDER — GABAPENTIN 600 MG PO TABS
1200.0000 mg | ORAL_TABLET | Freq: Two times a day (BID) | ORAL | Status: DC
  Administered 2015-02-02: 1200 mg via ORAL
  Filled 2015-02-02: qty 2

## 2015-02-02 MED ORDER — ONDANSETRON HCL 4 MG/2ML IJ SOLN: 4.0000 mg | Freq: Four times a day (QID) | INTRAMUSCULAR | Status: DC | PRN

## 2015-02-02 MED ORDER — FUROSEMIDE 80 MG PO TABS
80.0000 mg | ORAL_TABLET | Freq: Every day | ORAL | Status: DC
  Administered 2015-02-02: 80 mg via ORAL
  Filled 2015-02-02: qty 1

## 2015-02-02 MED ORDER — DEXTROSE 50 % IV SOLN
1.0000 | Freq: Once | INTRAVENOUS | Status: AC
  Administered 2015-02-02: 50 mL via INTRAVENOUS

## 2015-02-02 MED ORDER — FENTANYL CITRATE (PF) 100 MCG/2ML IJ SOLN
INTRAMUSCULAR | Status: AC
  Filled 2015-02-02: qty 2

## 2015-02-02 MED ORDER — COLLAGENASE 250 UNIT/GM EX OINT
TOPICAL_OINTMENT | Freq: Every day | CUTANEOUS | Status: DC
  Administered 2015-02-02: 23:00:00 via TOPICAL
  Filled 2015-02-02: qty 30

## 2015-02-02 MED ORDER — INSULIN ASPART 100 UNIT/ML ~~LOC~~ SOLN
SUBCUTANEOUS | Status: AC
  Filled 2015-02-02: qty 1

## 2015-02-02 MED ORDER — COLCHICINE 0.6 MG PO TABS: 0.6000 mg | ORAL_TABLET | Freq: Every day | ORAL | Status: DC

## 2015-02-02 MED ORDER — IODIXANOL 320 MG/ML IV SOLN
INTRAVENOUS | Status: DC | PRN
  Administered 2015-02-02: 100 mL via INTRA_ARTERIAL

## 2015-02-02 MED ORDER — ASPIRIN EC 81 MG PO TBEC
81.0000 mg | DELAYED_RELEASE_TABLET | Freq: Every day | ORAL | Status: DC
  Administered 2015-02-02: 81 mg via ORAL
  Filled 2015-02-02: qty 1

## 2015-02-02 MED ORDER — OXYCODONE-ACETAMINOPHEN 5-325 MG PO TABS
2.0000 | ORAL_TABLET | Freq: Three times a day (TID) | ORAL | Status: DC | PRN
  Administered 2015-02-02 – 2015-02-03 (×2): 2 via ORAL
  Filled 2015-02-02 (×2): qty 2

## 2015-02-02 SURGICAL SUPPLY — 13 items
CATH ANGIO 5F PIGTAIL 65CM (CATHETERS) ×2 IMPLANT
CATH CROSS OVER TEMPO 5F (CATHETERS) ×2 IMPLANT
CATH STRAIGHT 5FR 65CM (CATHETERS) ×2 IMPLANT
COVER PRB 48X5XTLSCP FOLD TPE (BAG) IMPLANT
COVER PROBE 5X48 (BAG) ×3
GUIDEWIRE ANGLED .035X150CM (WIRE) ×2 IMPLANT
KIT MICROINTRODUCER STIFF 5F (SHEATH) ×2 IMPLANT
KIT PV (KITS) ×3 IMPLANT
SHEATH PINNACLE 5F 10CM (SHEATH) ×2 IMPLANT
SYR MEDRAD MARK V 150ML (SYRINGE) ×3 IMPLANT
TRANSDUCER W/STOPCOCK (MISCELLANEOUS) ×3 IMPLANT
TRAY PV CATH (CUSTOM PROCEDURE TRAY) ×3 IMPLANT
WIRE HITORQ VERSACORE ST 145CM (WIRE) ×2 IMPLANT

## 2015-02-02 NOTE — Progress Notes (Signed)
Site area: left groin Site Prior to Removal:  Level  0 Pressure Applied For:  20 minutes Manual:   yes Patient Status During Pull:  stable Post Pull Site:  Level  0 Post Pull Instructions Given:  yes Post Pull Pulses Present: yes Dressing Applied:  tegaderm Bedrest begins @ [id number] Comments:

## 2015-02-02 NOTE — Interval H&P Note (Signed)
History and Physical Interval Note:  02/02/2015 3:31 PM  Marc [name]  has presented today for surgery, with the diagnosis of non healing right foot wound  The various methods of treatment have been discussed with the patient and family. After consideration of risks, benefits and other options for treatment, the patient has consented to  Procedure(s): Abdominal Aortogram w/Lower Extremity (N/A) as a surgical intervention .  The patient's history has been reviewed, patient examined, no change in status, stable for surgery.  I have reviewed the patient's chart and labs.  Questions were answered to the patient's satisfaction.     Deitra Mayo

## 2015-02-02 NOTE — Progress Notes (Signed)
Pts IVR increased to 150 cc hr per orders.  Stat EKG obtained.  Pt taken to cath lab holding area to be placed on a a monitor and receive glucose/insulin per orders while on a monitor.  Report given to Debbie  Young,R.N. Pt verbalizes understanding.

## 2015-02-02 NOTE — H&P (View-Only) (Signed)
HISTORY AND PHYSICAL     CC:  Non healing wound Referring Provider:  [name], *  HPI: This is a 75 y.o. male who saw Dr. Scot Dock earlier this year for PAD.  At that time, he had underwent a lower extremity vascular study which showed evidence of an occlusion of his right distal femoral artery.    He was noted to have claudication, and he continues to have this and it has not changed since previous visit.  He does have a hx of bilateral iliac stenting by Dr. Gwenlyn Found in the past.   He states that the week before Christmas, he was having swelling in his right foot, which got better.  This happened twice.  Then it swelled again without relief.  He thought this was gout.  He did take anti-inflammatories without relief.   He states that he got a crack in his foot and it has progressed to a larger wound.  He states that he is diabetic.  He is having pain and redness in his right foot. He subsequently got admitted to the hospital and had IV abx.  He continues to be on a po antibiotic.  Ortho did debride his foot while in the hospital.    He is on amaryl for his DM.  He is on an ACEI for BP management.  He is on a statin and baby aspirin.    Past Medical History  Diagnosis Date  . Hypertension   . Ulcer disease   . Colon polyp   . Gout   . Bradycardia   . Hypercholesteremia   . PAD (peripheral artery disease) ([place])   . Chronic bronchitis ([place])   . Type II diabetes mellitus ([place])   . History of GI bleed 2007  . Gout   . Tobacco abuse disorder 03/10/2012  . GERD (gastroesophageal reflux disease)   . Pneumonia     denies  . ARF (acute renal failure) ([place]) 12/2014    Past Surgical History  Procedure Laterality Date  . Splenectomy  ~ 1957  . Anterior cervical decomp/discectomy fusion  ~ 2007  . Upper endoscopy w/ sclerotherapy  ~ 2007  . Polypectomy    . Colonoscopy    . Iliac artery stent Left     left common/notes (02/04/2012)  . Hernia repair  ~ 2007    UHR (02/04/2012)  .  Tonsillectomy  ~ 1947  . Lumbar laminectomy  11/14  . Total knee arthroplasty Right 02/03/2013    Procedure: TOTAL KNEE ARTHROPLASTY;  Surgeon: [name], MD;  Location: [facility];  Service: Orthopedics;  Laterality: Right;  . Lower extremity angiogram N/A 02/04/2012    Procedure: LOWER EXTREMITY ANGIOGRAM;  Surgeon: Lorretta Harp, MD;  Location: [facility] CATH LAB;  Service: Cardiovascular;  Laterality: N/A;  . Percutaneous stent intervention Left 02/04/2012    Procedure: PERCUTANEOUS STENT INTERVENTION;  Surgeon: Lorretta Harp, MD;  Location: [facility] CATH LAB;  Service: Cardiovascular;  Laterality: Left;  lt ext iliac stent  . Replacement total knee Right   . Back surgery    . Anterior lat lumbar fusion Left 01/19/2014    Procedure: LATERAL INTERBODY FUSION 1 LEVEL;  Surgeon: Sinclair Ship, MD;  Location: [facility];  Service: Orthopedics;  Laterality: Left;  Left lumbar 3-4 lateral interbody fusion with instrumentation, allograft    No Known Allergies  Current Outpatient Prescriptions  Medication Sig Dispense Refill  . allopurinol (ZYLOPRIM) 300 MG tablet Take 300 mg by mouth daily.    [name]  amoxicillin-clavulanate (AUGMENTIN) 875-125 MG tablet Take 1 tablet by mouth 2 (two) times daily. 10 tablet 0  . aspirin EC 81 MG tablet Take 81 mg by mouth daily.    . clopidogrel (PLAVIX) 75 MG tablet Take 1 tablet by mouth daily.    . colchicine 0.6 MG tablet Take 1 tablet (0.6 mg total) by mouth daily. 10 tablet 0  . collagenase (SANTYL) ointment Apply topically daily. 15 g 0  . doxycycline (MONODOX) 100 MG capsule Take 1 capsule (100 mg total) by mouth 2 (two) times daily. 20 capsule 0  . furosemide (LASIX) 80 MG tablet Take 40 mg by mouth daily.     [name] gabapentin (NEURONTIN) 600 MG tablet Take 1,200 mg by mouth every 8 (eight) hours.     [name] glimepiride (AMARYL) 4 MG tablet Take 4 mg by mouth 2 (two) times daily.     [name] lisinopril (PRINIVIL,ZESTRIL) 20 MG tablet Take 1 tablet (20 mg total) by mouth daily. 30  tablet 0  . lovastatin (MEVACOR) 40 MG tablet Take 40 mg by mouth at bedtime.    [name] omeprazole (PRILOSEC OTC) 20 MG tablet Take 40 mg by mouth daily.     [name] oxyCODONE-acetaminophen (PERCOCET/ROXICET) 5-325 MG tablet Take 1 tablet by mouth every 4 (four) hours as needed for moderate pain or severe pain. 30 tablet 0  . pantoprazole (PROTONIX) 40 MG tablet Take 1 tablet (40 mg total) by mouth daily. 30 tablet 0   No current facility-administered medications for this visit.    Family History  Problem Relation Age of Onset  . Colon cancer Paternal Uncle     Uncle  . Heart disease Father   . Hypertension Father   . Heart attack Father   . Heart disease Mother   . Diabetes Mother   . Hypertension Mother     Social History   Social History  . Marital Status: Legally Separated    Spouse Name: N/A  . Number of Children: 2  . Years of Education: N/A   Occupational History  . retired   . cattle farm     owner   Social History Main Topics  . Smoking status: Current Every Day Smoker -- 2.00 packs/day for 58 years    Types: Cigarettes  . Smokeless tobacco: Former Systems developer    Types: Chew  . Alcohol Use: 0.0 oz/week    0 Standard drinks or equivalent per week     Comment:  quit for 22 yr; egg nog christmas  . Drug Use: No  . Sexual Activity: No   Other Topics Concern  . Not on file   Social History Narrative   Patient lives at home with his spouse.   Caffeine Use: 2 cups daily     ROS: [x]  Positive   [ ]  Negative   [ ]  All sytems reviewed and are negative  Cardiovascular: []  chest pain/pressure []  palpitations []  SOB lying flat []  DOE [x]  pain in legs while walking [x]  pain in feet when lying flat []  hx of DVT []  hx of phlebitis [x]  swelling in legs []  varicose veins  Pulmonary: []  productive cough []  asthma []  wheezing  Neurologic: []  weakness in []  arms []  legs []  numbness in []  arms []  legs [] difficulty speaking or slurred speech []  temporary loss of vision in  one eye []  dizziness  Hematologic: []  bleeding problems [x]  problems with blood clotting easily  GI []  vomiting blood []  blood in stool  GU: []  burning with  urination []  blood in urine  Psychiatric: []  hx of major depression  Integumentary: []  rashes []  ulcers  Constitutional: []  fever []  chills   PHYSICAL EXAMINATION:  Filed Vitals:   02/01/15 1456 02/01/15 1505  BP: 158/76 142/80  Pulse: 58 59  Temp: 97.7 F (36.5 C)   Resp: 16    Body mass index is 25.07 kg/(m^2).  General:  WDWN in NAD Gait: Not observed HENT: WNL, normocephalic Pulmonary: normal non-labored breathing , without Rales, rhonchi,  wheezing Cardiac: RRR, without  Murmurs, rubs or gallops; without carotid bruits Abdomen: soft, NT, no masses Skin: without rashes Vascular Exam/Pulses: He has a 4cmx5cm on the ball of the right foot with tendon exposed.  There is eschar on the tip of the right great toe; the right foot is with edema and erythema. Extremities: non palpable pedal pulses Musculoskeletal: no muscle wasting or atrophy  Neurologic: A&O X 3; Appropriate Affect ; SENSATION: normal; MOTOR FUNCTION:  moving all extremities equally. Speech is fluent/normal   Non-Invasive Vascular Imaging:   ABI's 02/01/15: Right:  0.42 Left:  0.62  Pt meds includes: Statin:  Yes.   Beta Blocker:  No. Aspirin:  Yes.   ACEI:  Yes.   ARB:  No. Other Antiplatelet/Anticoagulant:  Yes.   Plavix   ASSESSMENT/PLAN:: 75 y.o. male with hx of PAD now with limb threatening wound on the right foot.   -will plan for aortogram on Friday 02/03/15 with possible intervention.  If unable to intervene, will plan for right leg bypass on Tuesday 02/07/15.  -Dr. Scot Dock did discuss with the pt that this is limb threatening. -pt will continue his po abx.   Leontine Locket, PA-C [facility] [phone]  Clinic MD:  Pt seen and examined in conjunction with Dr. Scot Dock  Agree with above. This patient  has a very extensive wound involving the plantar aspect of his right great toe with exposed tendon. In addition he has evidence of multilevel arterial occlusive disease and diabetes. This is clearly a limb threatening situation. He is also a smoker. I think his only chance for limb salvage would be to proceed with arteriography in order to determine if he has any options for revascularization. I have reviewed with the patient the indications for arteriography. In addition, I have reviewed the potential complications of arteriography including but not limited to: Bleeding, arterial injury, arterial thrombosis, dye action, renal insufficiency, or other unpredictable medical problems. I have explained to the patient that if we find disease amenable to angioplasty we could potentially address this at the same time. I have discussed the potential complications of angioplasty and stenting, including but not limited to: Bleeding, arterial thrombosis, arterial injury, dissection, or the need for surgical intervention.  His arteriogram is scheduled for tomorrow, 02/02/2015. I tentatively have him scheduled for a right lower extremity bypass on 02/07/2015 if he is not a candidate for an endovascular approach. If he does not have any options for revascularization he would likely require a below-the-knee amputation. The patient has made it clear to me that he he wants the most definitive procedure if possible. In addition, although he has previously had arteriography by Dr. Quay Burow patient has requested that I do his arteriogram.  Deitra Mayo, MD, FACS Beeper [phone] Office: [phone]

## 2015-02-02 NOTE — Progress Notes (Signed)
Critical lab K+ of 6.3 received from lab, reported to [facility] cath lab.

## 2015-02-02 NOTE — Op Note (Signed)
   PATIENT: Marc [name]  MRN: [id number] DOB: [birth year]    DATE OF PROCEDURE: 02/02/2015  INDICATIONS: [name] is a 75 y.o. male who presented with an extensive wound on the plantar aspect of his right great toe with exposed tendon. He had evidence of multilevel arterial occlusive disease and presents for arteriography.  PROCEDURE:  1. Ultrasound-guided access to the left common femoral artery 2. Aortogram and bilateral iliac arteriogram 3. Selective catheterization of the right common femoral artery with right lower extremity runoff  SURGEON: Judeth Cornfield. Scot Dock, MD, FACS  ANESTHESIA: local with sedation   EBL: minimal  TECHNIQUE: The patient was taken to the peripheral vascular lab received 1 mg of Versed and 50 g of fentanyl. Both groins were prepped and draped in usual sterile fashion. Under ultrasound guidance, after the skin was anesthetized, the left common femoral artery was cannulated with a micropuncture needle and a micropuncture sheath introduced over the wire. This was exchanged for a 5 French sheath over a versa core wire. A pigtail catheter was positioned just above the aortic bifurcation and oblique iliac projection was obtained. This catheter was exchanged for a crossover catheter which was positioned into the proximal right common iliac artery. An angled Glidewire was advanced into the external iliac artery and the crossover catheter exchanged for a straight catheter. Selective right external iliac arterial was obtained with right lower extremity runoff.  FINDINGS:  1. The distal infrarenal aorta, bilateral common iliac arteries and bilateral external iliac arteries are patent. There is a stent in the left external iliac proximally which has mild in-stent stenosis. There is a stent in the distal right external iliac artery and proximal common femoral artery with mild in-stent stenosis. 2. The right deep femoral artery is patent. The superficial femoral arteries  occluded at its origin with reconstitution of the below-knee palpatory which is diseased. There is single-vessel runoff via the peroneal artery on the right. The anterior tibial and posterior tibial arteries are occluded. Both reconstitute distally in the foot.  CLINICAL NOTE: Given the extensive wound on his right great toe with severe infrainguinal arterial occlusive disease and diabetes this is clearly a limb threatening situation. I think his only option for limb salvage is an attempted right femoral to below knee or peroneal artery bypass. This has been scheduled for next Tuesday. I have reviewed the indications for lower extremity bypass. I have also reviewed the potential complications of surgery including but not limited to: wound healing problems, infection, graft thrombosis, limb loss, or other unpredictable medical problems. All the patient's questions were answered and they are agreeable to proceed. His surgery is scheduled for 02/07/15.  Deitra Mayo, MD, FACS [facility]  DATE OF DICTATION:   02/02/2015

## 2015-02-03 ENCOUNTER — Other Ambulatory Visit: Payer: Self-pay

## 2015-02-03 ENCOUNTER — Encounter (HOSPITAL_COMMUNITY): Payer: Self-pay | Admitting: Vascular Surgery

## 2015-02-03 DIAGNOSIS — I70261 Atherosclerosis of native arteries of extremities with gangrene, right leg: Secondary | ICD-10-CM | POA: Diagnosis not present

## 2015-02-03 LAB — BASIC METABOLIC PANEL
Anion gap: 6 (ref 5–15)
BUN: 38 mg/dL — ABNORMAL HIGH (ref 6–20)
CHLORIDE: 111 mmol/L (ref 101–111)
CO2: 27 mmol/L (ref 22–32)
CREATININE: 1.5 mg/dL — AB (ref 0.61–1.24)
Calcium: 9.1 mg/dL (ref 8.9–10.3)
GFR calc non Af Amer: 44 mL/min — ABNORMAL LOW (ref >60)
GFR, EST AFRICAN AMERICAN: 51 mL/min — AB (ref >60)
Glucose, Bld: 73 mg/dL (ref 65–99)
POTASSIUM: 5.1 mmol/L (ref 3.5–5.1)
Sodium: 144 mmol/L (ref 135–145)

## 2015-02-03 NOTE — Progress Notes (Signed)
Patient upset that he was not discharged yet.  He expected to leave at 0700 even though he did not have a ride.  Explained to patient that doctor would be around to check on him and get discharge paperwork ready.  Dr. Scot Dock saw pt after 0900 and pt expected to leave within 10 minutes.  Pt did not want to be reasoned with or wait for paperwork.  I tried to calm patient and called Dr. Scot Dock and PA (both in [place]). PA Aldona Bar called as I was wheeling patient out.  Printed AVS and told patient that doctors office would call with information concerning surgery on Tuesday 02/07/15.  Patient aware he is not to take plavix.  Will make attempt to call patient on cell phone to go over discharge instructions. Patient had wife drive him home in his own vehicle. Payton Emerald, RN

## 2015-02-03 NOTE — Discharge Summary (Signed)
Patient ID: Marc [name] MRN: [id number] DOB/AGE: [birth year] 75 y.o.  Admit date: 02/02/2015 Discharge date: 02/03/2015  Admission Diagnosis: non healing right foot wound  Discharge Diagnoses:  non healing right foot wound  Secondary Diagnoses: Past Medical History  Diagnosis Date  . Hypertension   . Ulcer disease   . Colon polyp   . Gout   . Bradycardia   . Hypercholesteremia   . PAD (peripheral artery disease) ([place])   . Chronic bronchitis ([place])   . Type II diabetes mellitus ([place])   . History of GI bleed 2007  . Gout   . Tobacco abuse disorder 03/10/2012  . GERD (gastroesophageal reflux disease)   . Pneumonia     denies  . ARF (acute renal failure) ([place]) 12/2014    Procedures: 02/02/2015 Surgeon(s): Angelia [name], MD Procedure(s): Abdominal Aortogram w/Lower Extremity Right  Discharged Condition: good  HPI: This is a 75 year old gentleman who was admitted with an extensive wound on his right great toe on the plantar aspect first toe. He had severe infrainguinal arterial occlusive disease and this was felt to clearly be a limb threatening problem. He underwent an arteriogram yesterday which showed a superficial femoral artery occlusion and severe tibial artery occlusive disease. His only option for revascularization would be a femoral to peroneal artery bypass graft with ray amputation of the right great toe and possible second toe. The procedure was done late and he was kept overnight for observation.  Hospital Course: the patient underwent an uneventful arteriogram and was admitted overnight for observation. The following morning he had no hematoma in the left groin where he had cannulation. The toe was inspected and there is extensive dry gangrene on the plantar aspect of the right great toe extending back fairly far on the foot. I again explained the indications for bypass and toe amputation. We discussed that even with successful bypass there is a 20% risk of  limb loss given the extent of the wound. I also discussed having to potentially place a negative pressure dressing on the wound if it is clean enough for this as he will require a fairly extensive partial foot amputation. We have also discussed the potential complications of bypass including but not limited to bleeding, wound healing problems, graft thrombosis, limb loss. All of his questions were answered and he is agreeable to proceed with surgery next Tuesday.  Consults:     Significant Diagnostic Studies:  No results found.  CBC    Component Value Date/Time   WBC 9.7 01/20/2015 0650   RBC 2.85* 01/20/2015 0650   HGB 10.5* 02/02/2015 1711   HCT 31.0* 02/02/2015 1711   PLT 212 01/20/2015 0650   MCV 100.0 01/20/2015 0650   MCH 31.6 01/20/2015 0650   MCHC 31.6 01/20/2015 0650   RDW 14.0 01/20/2015 0650   LYMPHSABS 1.7 01/17/2015 1620   MONOABS 0.5 01/17/2015 1620   EOSABS 0.2 01/17/2015 1620   BASOSABS 0.0 01/17/2015 1620    BMET    Component Value Date/Time   NA 144 02/03/2015 0402   K 5.1 02/03/2015 0402   CL 111 02/03/2015 0402   CO2 27 02/03/2015 0402   GLUCOSE 73 02/03/2015 0402   BUN 38* 02/03/2015 0402   CREATININE 1.50* 02/03/2015 0402   CALCIUM 9.1 02/03/2015 0402   GFRNONAA 44* 02/03/2015 0402   GFRAA 51* 02/03/2015 0402    COAG Lab Results  Component Value Date   INR 1.03 07/26/2014   INR 1.05 01/12/2014   INR  1.02 09/08/2013   No results found for: PTT  Disposition: 01-Home or Self Care     Medication List    ASK your doctor about these medications        allopurinol 300 MG tablet  Commonly known as:  ZYLOPRIM  Take 300 mg by mouth 2 (two) times daily.     amoxicillin-clavulanate 875-125 MG tablet  Commonly known as:  AUGMENTIN  Take 1 tablet by mouth 2 (two) times daily. 30 day course started 02/01/15     aspirin EC 81 MG tablet  Take 81 mg by mouth at bedtime.     atorvastatin 10 MG tablet  Commonly known as:  LIPITOR  Take 10 mg by  mouth at bedtime.     clopidogrel 75 MG tablet  Commonly known as:  PLAVIX  Take 75 mg by mouth at bedtime.     colchicine 0.6 MG tablet  Take 1 tablet (0.6 mg total) by mouth daily.     collagenase ointment  Commonly known as:  SANTYL  Apply topically daily.     furosemide 80 MG tablet  Commonly known as:  LASIX  Take 80 mg by mouth daily.     gabapentin 600 MG tablet  Commonly known as:  NEURONTIN  Take 1,200 mg by mouth 2 (two) times daily.     glimepiride 4 MG tablet  Commonly known as:  AMARYL  Take 4 mg by mouth 2 (two) times daily.     lisinopril 20 MG tablet  Commonly known as:  PRINIVIL,ZESTRIL  Take 1 tablet (20 mg total) by mouth daily.     oxyCODONE-acetaminophen 5-325 MG tablet  Commonly known as:  PERCOCET/ROXICET  Take 1 tablet by mouth every 4 (four) hours as needed for moderate pain or severe pain.     pantoprazole 40 MG tablet  Commonly known as:  PROTONIX  Take 1 tablet (40 mg total) by mouth daily.     potassium gluconate 595 MG Tabs tablet  Take 595 mg by mouth at bedtime as needed (leg cramps).     predniSONE 20 MG tablet  Commonly known as:  DELTASONE  Take 20 mg by mouth daily.        Signed: Deitra Mayo 02/03/2015, 9:16 AM

## 2015-02-03 NOTE — Care Management Note (Signed)
Pt pulled out IV's and demanded to leave right away per bedside nurse.  Pt left prior to CM being able to assess him for needs.  Bedside nurse stated that pt is ambulatory and she is not aware of any discharge needs.  CM attempted to contact pt to inquire of discharge needs, CM was not able to reach pt.  Pt was active with [facility], resumption orders not written prior to discharge, CM contacted agency to make aware of admit and discharge, agency will request resumption orders from primary.

## 2015-02-06 ENCOUNTER — Encounter (HOSPITAL_COMMUNITY): Payer: Self-pay | Admitting: *Deleted

## 2015-02-06 MED ORDER — CHLORHEXIDINE GLUCONATE 4 % EX LIQD: 60.0000 mL | Freq: Once | CUTANEOUS | Status: DC

## 2015-02-06 MED ORDER — DEXTROSE 5 % IV SOLN
1.5000 g | INTRAVENOUS | Status: AC
  Administered 2015-02-07 (×2): 1.5 g via INTRAVENOUS
  Filled 2015-02-06: qty 1.5

## 2015-02-06 MED ORDER — SODIUM CHLORIDE 0.9 % IV SOLN: INTRAVENOUS | Status: DC

## 2015-02-06 MED ORDER — MUPIROCIN 2 % EX OINT
1.0000 "application " | TOPICAL_OINTMENT | Freq: Once | CUTANEOUS | Status: AC
  Administered 2015-02-07: 1 via TOPICAL
  Filled 2015-02-06: qty 22

## 2015-02-06 NOTE — Progress Notes (Signed)
Pt denies cardiac history, chest pain or sob. Pt will driving himself here tomorrow, brother Abbe Amsterdam is his contact person. Instructed pt not to take evening dose of Glimepiride today. States he's not had Plavix since 01/30/15.

## 2015-02-07 ENCOUNTER — Encounter (HOSPITAL_COMMUNITY): Payer: Self-pay | Admitting: *Deleted

## 2015-02-07 ENCOUNTER — Encounter (HOSPITAL_COMMUNITY)
Admission: RE | Disposition: A | Discharge status: Home Health | Payer: Self-pay | Source: Ambulatory Visit | Attending: Vascular Surgery

## 2015-02-07 ENCOUNTER — Inpatient Hospital Stay (HOSPITAL_COMMUNITY)
Admission: RE | Admit: 2015-02-07 | Discharge: 2015-02-11 | DRG: 240 | Disposition: A | Discharge status: Home Health | Payer: Medicare Other | Source: Ambulatory Visit | Attending: Vascular Surgery | Admitting: Vascular Surgery

## 2015-02-07 ENCOUNTER — Inpatient Hospital Stay (HOSPITAL_COMMUNITY): Payer: Medicare Other | Admitting: Certified Registered Nurse Anesthetist

## 2015-02-07 ENCOUNTER — Inpatient Hospital Stay (HOSPITAL_COMMUNITY): Payer: Medicare Other

## 2015-02-07 DIAGNOSIS — Z96651 Presence of right artificial knee joint: Secondary | ICD-10-CM | POA: Diagnosis present

## 2015-02-07 DIAGNOSIS — E1152 Type 2 diabetes mellitus with diabetic peripheral angiopathy with gangrene: Principal | ICD-10-CM | POA: Diagnosis present

## 2015-02-07 DIAGNOSIS — Z9081 Acquired absence of spleen: Secondary | ICD-10-CM

## 2015-02-07 DIAGNOSIS — Z981 Arthrodesis status: Secondary | ICD-10-CM | POA: Diagnosis not present

## 2015-02-07 DIAGNOSIS — I779 Disorder of arteries and arterioles, unspecified: Secondary | ICD-10-CM | POA: Diagnosis present

## 2015-02-07 DIAGNOSIS — Z7902 Long term (current) use of antithrombotics/antiplatelets: Secondary | ICD-10-CM | POA: Diagnosis not present

## 2015-02-07 DIAGNOSIS — Z7982 Long term (current) use of aspirin: Secondary | ICD-10-CM

## 2015-02-07 DIAGNOSIS — L0390 Cellulitis, unspecified: Secondary | ICD-10-CM | POA: Diagnosis present

## 2015-02-07 DIAGNOSIS — I70261 Atherosclerosis of native arteries of extremities with gangrene, right leg: Secondary | ICD-10-CM | POA: Diagnosis present

## 2015-02-07 DIAGNOSIS — E7800 Pure hypercholesterolemia, unspecified: Secondary | ICD-10-CM | POA: Diagnosis present

## 2015-02-07 DIAGNOSIS — F17210 Nicotine dependence, cigarettes, uncomplicated: Secondary | ICD-10-CM | POA: Diagnosis present

## 2015-02-07 DIAGNOSIS — Z79899 Other long term (current) drug therapy: Secondary | ICD-10-CM

## 2015-02-07 DIAGNOSIS — Z7984 Long term (current) use of oral hypoglycemic drugs: Secondary | ICD-10-CM | POA: Diagnosis not present

## 2015-02-07 DIAGNOSIS — Z419 Encounter for procedure for purposes other than remedying health state, unspecified: Secondary | ICD-10-CM

## 2015-02-07 DIAGNOSIS — M1990 Unspecified osteoarthritis, unspecified site: Secondary | ICD-10-CM | POA: Diagnosis present

## 2015-02-07 DIAGNOSIS — K219 Gastro-esophageal reflux disease without esophagitis: Secondary | ICD-10-CM | POA: Diagnosis present

## 2015-02-07 DIAGNOSIS — I739 Peripheral vascular disease, unspecified: Secondary | ICD-10-CM | POA: Diagnosis present

## 2015-02-07 DIAGNOSIS — I10 Essential (primary) hypertension: Secondary | ICD-10-CM | POA: Diagnosis present

## 2015-02-07 DIAGNOSIS — D62 Acute posthemorrhagic anemia: Secondary | ICD-10-CM | POA: Diagnosis not present

## 2015-02-07 DIAGNOSIS — M109 Gout, unspecified: Secondary | ICD-10-CM | POA: Diagnosis present

## 2015-02-07 DIAGNOSIS — N289 Disorder of kidney and ureter, unspecified: Secondary | ICD-10-CM | POA: Diagnosis not present

## 2015-02-07 HISTORY — PX: AMPUTATION: SHX166

## 2015-02-07 HISTORY — PX: INTRAOPERATIVE ARTERIOGRAM: SHX5157

## 2015-02-07 HISTORY — PX: BYPASS GRAFT FEMORAL-PERONEAL: SHX5762

## 2015-02-07 HISTORY — PX: APPLICATION OF WOUND VAC: SHX5189

## 2015-02-07 HISTORY — DX: Polyneuropathy, unspecified: G62.9

## 2015-02-07 HISTORY — PX: ENDARTERECTOMY TIBIOPERONEAL: SHX5808

## 2015-02-07 HISTORY — PX: PATCH ANGIOPLASTY: SHX6230

## 2015-02-07 LAB — SURGICAL PCR SCREEN
MRSA, PCR: NEGATIVE
STAPHYLOCOCCUS AUREUS: NEGATIVE

## 2015-02-07 LAB — COMPREHENSIVE METABOLIC PANEL
ALBUMIN: 2.9 g/dL — AB (ref 3.5–5.0)
ALK PHOS: 84 U/L (ref 38–126)
ALT: 6 U/L — AB (ref 17–63)
AST: 11 U/L — AB (ref 15–41)
Anion gap: 8 (ref 5–15)
BUN: 41 mg/dL — AB (ref 6–20)
CALCIUM: 8.9 mg/dL (ref 8.9–10.3)
CO2: 28 mmol/L (ref 22–32)
CREATININE: 1.85 mg/dL — AB (ref 0.61–1.24)
Chloride: 108 mmol/L (ref 101–111)
GFR calc Af Amer: 39 mL/min — ABNORMAL LOW (ref >60)
GFR calc non Af Amer: 34 mL/min — ABNORMAL LOW (ref >60)
GLUCOSE: 91 mg/dL (ref 65–99)
Potassium: 5.2 mmol/L — ABNORMAL HIGH (ref 3.5–5.1)
SODIUM: 144 mmol/L (ref 135–145)
Total Bilirubin: 0.6 mg/dL (ref 0.3–1.2)
Total Protein: 7.3 g/dL (ref 6.5–8.1)

## 2015-02-07 LAB — APTT: APTT: 35 s (ref 24–37)

## 2015-02-07 LAB — GLUCOSE, CAPILLARY
GLUCOSE-CAPILLARY: 90 mg/dL (ref 65–99)
Glucose-Capillary: 125 mg/dL — ABNORMAL HIGH (ref 65–99)
Glucose-Capillary: 156 mg/dL — ABNORMAL HIGH (ref 65–99)

## 2015-02-07 LAB — CBC
HCT: 33.5 % — ABNORMAL LOW (ref 39.0–52.0)
HEMOGLOBIN: 10.7 g/dL — AB (ref 13.0–17.0)
MCH: 31.8 pg (ref 26.0–34.0)
MCHC: 31.9 g/dL (ref 30.0–36.0)
MCV: 99.7 fL (ref 78.0–100.0)
Platelets: 280 10*3/uL (ref 150–400)
RBC: 3.36 MIL/uL — AB (ref 4.22–5.81)
RDW: 14.2 % (ref 11.5–15.5)
WBC: 10.7 10*3/uL — ABNORMAL HIGH (ref 4.0–10.5)

## 2015-02-07 LAB — PROTIME-INR
INR: 1.12 (ref 0.00–1.49)
Prothrombin Time: 14.6 seconds (ref 11.6–15.2)

## 2015-02-07 SURGERY — CREATION, BYPASS, ARTERIAL, FEMORAL TO PERONEAL, USING GRAFT
Anesthesia: General | Site: Toe | Laterality: Right

## 2015-02-07 MED ORDER — ATORVASTATIN CALCIUM 10 MG PO TABS
10.0000 mg | ORAL_TABLET | Freq: Every day | ORAL | Status: DC
  Administered 2015-02-07 – 2015-02-10 (×4): 10 mg via ORAL
  Filled 2015-02-07 (×4): qty 1

## 2015-02-07 MED ORDER — PHENYLEPHRINE 40 MCG/ML (10ML) SYRINGE FOR IV PUSH (FOR BLOOD PRESSURE SUPPORT)
PREFILLED_SYRINGE | INTRAVENOUS | Status: AC
  Filled 2015-02-07: qty 50

## 2015-02-07 MED ORDER — EPHEDRINE SULFATE 50 MG/ML IJ SOLN
INTRAMUSCULAR | Status: DC | PRN
  Administered 2015-02-07: 10 mg via INTRAVENOUS
  Administered 2015-02-07: 5 mg via INTRAVENOUS
  Administered 2015-02-07: 10 mg via INTRAVENOUS
  Administered 2015-02-07 (×3): 5 mg via INTRAVENOUS

## 2015-02-07 MED ORDER — PHENYLEPHRINE HCL 10 MG/ML IJ SOLN
INTRAMUSCULAR | Status: DC | PRN
  Administered 2015-02-07 (×2): 120 ug via INTRAVENOUS
  Administered 2015-02-07: 200 ug via INTRAVENOUS
  Administered 2015-02-07: 120 ug via INTRAVENOUS

## 2015-02-07 MED ORDER — MORPHINE SULFATE (PF) 2 MG/ML IV SOLN
2.0000 mg | INTRAVENOUS | Status: DC | PRN
  Administered 2015-02-07: 4 mg via INTRAVENOUS
  Administered 2015-02-08: 2 mg via INTRAVENOUS
  Administered 2015-02-09 – 2015-02-10 (×2): 4 mg via INTRAVENOUS
  Filled 2015-02-07: qty 2
  Filled 2015-02-07 (×3): qty 1

## 2015-02-07 MED ORDER — FENTANYL CITRATE (PF) 250 MCG/5ML IJ SOLN
INTRAMUSCULAR | Status: AC
  Filled 2015-02-07: qty 5

## 2015-02-07 MED ORDER — GABAPENTIN 600 MG PO TABS
1200.0000 mg | ORAL_TABLET | Freq: Two times a day (BID) | ORAL | Status: DC
  Administered 2015-02-07 – 2015-02-10 (×6): 1200 mg via ORAL
  Filled 2015-02-07 (×6): qty 2

## 2015-02-07 MED ORDER — PANTOPRAZOLE SODIUM 40 MG PO TBEC
40.0000 mg | DELAYED_RELEASE_TABLET | Freq: Every day | ORAL | Status: DC
  Administered 2015-02-08 – 2015-02-11 (×4): 40 mg via ORAL
  Filled 2015-02-07 (×4): qty 1

## 2015-02-07 MED ORDER — DEXTROSE 5 % IV SOLN
1.5000 g | Freq: Two times a day (BID) | INTRAVENOUS | Status: AC
  Administered 2015-02-08 (×2): 1.5 g via INTRAVENOUS
  Filled 2015-02-07 (×3): qty 1.5

## 2015-02-07 MED ORDER — HYDROCODONE-ACETAMINOPHEN 7.5-325 MG PO TABS: 1.0000 | ORAL_TABLET | Freq: Once | ORAL | Status: DC | PRN

## 2015-02-07 MED ORDER — THROMBIN 20000 UNITS EX SOLR
CUTANEOUS | Status: AC
  Filled 2015-02-07: qty 20000

## 2015-02-07 MED ORDER — GUAIFENESIN-DM 100-10 MG/5ML PO SYRP: 15.0000 mL | ORAL_SOLUTION | ORAL | Status: DC | PRN

## 2015-02-07 MED ORDER — ONDANSETRON HCL 4 MG/2ML IJ SOLN
INTRAMUSCULAR | Status: DC | PRN
  Administered 2015-02-07: 4 mg via INTRAVENOUS

## 2015-02-07 MED ORDER — HEPARIN SODIUM (PORCINE) 1000 UNIT/ML IJ SOLN
INTRAMUSCULAR | Status: DC | PRN
  Administered 2015-02-07: 8000 [IU] via INTRAVENOUS
  Administered 2015-02-07: 5000 [IU] via INTRAVENOUS

## 2015-02-07 MED ORDER — SODIUM CHLORIDE 0.9 % IV SOLN
INTRAVENOUS | Status: DC
  Administered 2015-02-07: 50 mL/h via INTRAVENOUS

## 2015-02-07 MED ORDER — ACETAMINOPHEN 325 MG PO TABS: 325.0000 mg | ORAL_TABLET | ORAL | Status: DC | PRN

## 2015-02-07 MED ORDER — PROTAMINE SULFATE 10 MG/ML IV SOLN
INTRAVENOUS | Status: DC | PRN
  Administered 2015-02-07: 40 mg via INTRAVENOUS

## 2015-02-07 MED ORDER — 0.9 % SODIUM CHLORIDE (POUR BTL) OPTIME
TOPICAL | Status: DC | PRN
  Administered 2015-02-07: 1000 mL

## 2015-02-07 MED ORDER — ENOXAPARIN SODIUM 30 MG/0.3ML ~~LOC~~ SOLN
30.0000 mg | SUBCUTANEOUS | Status: DC
  Administered 2015-02-08 – 2015-02-11 (×3): 30 mg via SUBCUTANEOUS
  Filled 2015-02-07 (×3): qty 0.3

## 2015-02-07 MED ORDER — LISINOPRIL 10 MG PO TABS
20.0000 mg | ORAL_TABLET | Freq: Every day | ORAL | Status: DC
  Administered 2015-02-07 – 2015-02-10 (×4): 20 mg via ORAL
  Filled 2015-02-07 (×3): qty 2
  Filled 2015-02-07: qty 1

## 2015-02-07 MED ORDER — PHENYLEPHRINE HCL 10 MG/ML IJ SOLN
10.0000 mg | INTRAVENOUS | Status: DC | PRN
  Administered 2015-02-07: 30 ug/min via INTRAVENOUS

## 2015-02-07 MED ORDER — ASPIRIN EC 81 MG PO TBEC
81.0000 mg | DELAYED_RELEASE_TABLET | Freq: Every day | ORAL | Status: DC
  Administered 2015-02-07 – 2015-02-10 (×4): 81 mg via ORAL
  Filled 2015-02-07 (×4): qty 1

## 2015-02-07 MED ORDER — SUCCINYLCHOLINE CHLORIDE 20 MG/ML IJ SOLN
INTRAMUSCULAR | Status: DC | PRN
  Administered 2015-02-07: 100 mg via INTRAVENOUS

## 2015-02-07 MED ORDER — METOPROLOL TARTRATE 1 MG/ML IV SOLN: 2.0000 mg | INTRAVENOUS | Status: DC | PRN

## 2015-02-07 MED ORDER — PAPAVERINE HCL 30 MG/ML IJ SOLN
INTRAMUSCULAR | Status: AC
  Filled 2015-02-07: qty 2

## 2015-02-07 MED ORDER — COLCHICINE 0.6 MG PO TABS
0.6000 mg | ORAL_TABLET | Freq: Every day | ORAL | Status: DC
  Administered 2015-02-08 – 2015-02-10 (×3): 0.6 mg via ORAL
  Filled 2015-02-07 (×4): qty 1

## 2015-02-07 MED ORDER — ROCURONIUM BROMIDE 50 MG/5ML IV SOLN
INTRAVENOUS | Status: AC
  Filled 2015-02-07: qty 1

## 2015-02-07 MED ORDER — EPHEDRINE SULFATE 50 MG/ML IJ SOLN
INTRAMUSCULAR | Status: AC
  Filled 2015-02-07: qty 1

## 2015-02-07 MED ORDER — LIDOCAINE HCL (CARDIAC) 20 MG/ML IV SOLN
INTRAVENOUS | Status: AC
  Filled 2015-02-07: qty 5

## 2015-02-07 MED ORDER — PROPOFOL 10 MG/ML IV BOLUS
INTRAVENOUS | Status: DC | PRN
  Administered 2015-02-07: 100 mg via INTRAVENOUS
  Administered 2015-02-07: 30 mg via INTRAVENOUS

## 2015-02-07 MED ORDER — POTASSIUM CHLORIDE CRYS ER 20 MEQ PO TBCR: 20.0000 meq | EXTENDED_RELEASE_TABLET | Freq: Every day | ORAL | Status: DC | PRN

## 2015-02-07 MED ORDER — ACETAMINOPHEN 650 MG RE SUPP: 325.0000 mg | RECTAL | Status: DC | PRN

## 2015-02-07 MED ORDER — IPRATROPIUM-ALBUTEROL 0.5-2.5 (3) MG/3ML IN SOLN
3.0000 mL | Freq: Once | RESPIRATORY_TRACT | Status: AC
  Administered 2015-02-07: 3 mL via RESPIRATORY_TRACT
  Filled 2015-02-07: qty 3

## 2015-02-07 MED ORDER — OXYCODONE-ACETAMINOPHEN 5-325 MG PO TABS
1.0000 | ORAL_TABLET | ORAL | Status: DC | PRN
  Administered 2015-02-07: 2 via ORAL
  Administered 2015-02-08: 1 via ORAL
  Administered 2015-02-08 – 2015-02-11 (×7): 2 via ORAL
  Filled 2015-02-07: qty 2
  Filled 2015-02-07: qty 1
  Filled 2015-02-07 (×3): qty 2
  Filled 2015-02-07: qty 1
  Filled 2015-02-07 (×2): qty 2
  Filled 2015-02-07: qty 1
  Filled 2015-02-07 (×2): qty 2

## 2015-02-07 MED ORDER — LABETALOL HCL 5 MG/ML IV SOLN: 10.0000 mg | INTRAVENOUS | Status: DC | PRN

## 2015-02-07 MED ORDER — MORPHINE SULFATE (PF) 2 MG/ML IV SOLN
INTRAVENOUS | Status: AC
  Administered 2015-02-07: 4 mg via INTRAVENOUS
  Filled 2015-02-07: qty 2

## 2015-02-07 MED ORDER — MAGNESIUM SULFATE 2 GM/50ML IV SOLN
2.0000 g | Freq: Every day | INTRAVENOUS | Status: DC | PRN
  Filled 2015-02-07: qty 50

## 2015-02-07 MED ORDER — GLIMEPIRIDE 4 MG PO TABS
4.0000 mg | ORAL_TABLET | Freq: Two times a day (BID) | ORAL | Status: DC
  Administered 2015-02-07 – 2015-02-11 (×8): 4 mg via ORAL
  Filled 2015-02-07 (×8): qty 1

## 2015-02-07 MED ORDER — HYDRALAZINE HCL 20 MG/ML IJ SOLN: 5.0000 mg | INTRAMUSCULAR | Status: DC | PRN

## 2015-02-07 MED ORDER — FENTANYL CITRATE (PF) 100 MCG/2ML IJ SOLN
INTRAMUSCULAR | Status: AC
  Filled 2015-02-07: qty 2

## 2015-02-07 MED ORDER — LACTATED RINGERS IV SOLN
INTRAVENOUS | Status: DC | PRN
  Administered 2015-02-07 (×3): via INTRAVENOUS

## 2015-02-07 MED ORDER — SODIUM CHLORIDE 0.9 % IV SOLN
INTRAVENOUS | Status: DC | PRN
  Administered 2015-02-07: 07:00:00

## 2015-02-07 MED ORDER — FENTANYL CITRATE (PF) 100 MCG/2ML IJ SOLN
25.0000 ug | INTRAMUSCULAR | Status: DC | PRN
  Administered 2015-02-07 (×4): 25 ug via INTRAVENOUS

## 2015-02-07 MED ORDER — ONDANSETRON HCL 4 MG/2ML IJ SOLN: 4.0000 mg | Freq: Once | INTRAMUSCULAR | Status: DC | PRN

## 2015-02-07 MED ORDER — ONDANSETRON HCL 4 MG/2ML IJ SOLN: 4.0000 mg | Freq: Four times a day (QID) | INTRAMUSCULAR | Status: DC | PRN

## 2015-02-07 MED ORDER — PROPOFOL 10 MG/ML IV BOLUS
INTRAVENOUS | Status: AC
  Filled 2015-02-07: qty 20

## 2015-02-07 MED ORDER — ALBUMIN HUMAN 5 % IV SOLN
INTRAVENOUS | Status: DC | PRN
  Administered 2015-02-07: 13:00:00 via INTRAVENOUS

## 2015-02-07 MED ORDER — DOCUSATE SODIUM 100 MG PO CAPS
100.0000 mg | ORAL_CAPSULE | Freq: Every day | ORAL | Status: DC
  Administered 2015-02-08 – 2015-02-11 (×4): 100 mg via ORAL
  Filled 2015-02-07 (×4): qty 1

## 2015-02-07 MED ORDER — FUROSEMIDE 80 MG PO TABS
80.0000 mg | ORAL_TABLET | Freq: Every day | ORAL | Status: DC
  Administered 2015-02-09 – 2015-02-11 (×3): 80 mg via ORAL
  Filled 2015-02-07 (×4): qty 1

## 2015-02-07 MED ORDER — ROCURONIUM BROMIDE 100 MG/10ML IV SOLN
INTRAVENOUS | Status: DC | PRN
  Administered 2015-02-07: 30 mg via INTRAVENOUS
  Administered 2015-02-07 (×2): 10 mg via INTRAVENOUS

## 2015-02-07 MED ORDER — SUCCINYLCHOLINE CHLORIDE 20 MG/ML IJ SOLN
INTRAMUSCULAR | Status: AC
  Filled 2015-02-07: qty 1

## 2015-02-07 MED ORDER — ONDANSETRON HCL 4 MG/2ML IJ SOLN
INTRAMUSCULAR | Status: AC
  Filled 2015-02-07: qty 2

## 2015-02-07 MED ORDER — PHENOL 1.4 % MT LIQD: 1.0000 | OROMUCOSAL | Status: DC | PRN

## 2015-02-07 MED ORDER — IPRATROPIUM-ALBUTEROL 0.5-2.5 (3) MG/3ML IN SOLN
RESPIRATORY_TRACT | Status: AC
  Administered 2015-02-07: 3 mL via RESPIRATORY_TRACT
  Filled 2015-02-07: qty 3

## 2015-02-07 MED ORDER — SODIUM CHLORIDE 0.9 % IV SOLN
500.0000 mL | Freq: Once | INTRAVENOUS | Status: AC | PRN
  Administered 2015-02-07 (×2): 500 mL via INTRAVENOUS

## 2015-02-07 MED ORDER — ALUM & MAG HYDROXIDE-SIMETH 200-200-20 MG/5ML PO SUSP: 15.0000 mL | ORAL | Status: DC | PRN

## 2015-02-07 MED ORDER — ALLOPURINOL 300 MG PO TABS
300.0000 mg | ORAL_TABLET | Freq: Two times a day (BID) | ORAL | Status: DC
  Administered 2015-02-07 – 2015-02-11 (×8): 300 mg via ORAL
  Filled 2015-02-07 (×8): qty 1

## 2015-02-07 MED ORDER — AMOXICILLIN-POT CLAVULANATE 875-125 MG PO TABS
1.0000 | ORAL_TABLET | Freq: Two times a day (BID) | ORAL | Status: DC
  Administered 2015-02-08: 1 via ORAL
  Filled 2015-02-07 (×2): qty 1

## 2015-02-07 MED ORDER — THROMBIN 20000 UNITS EX SOLR
CUTANEOUS | Status: DC | PRN
  Administered 2015-02-07: 20 mL via TOPICAL

## 2015-02-07 MED ORDER — DEXTROSE 5 % IV SOLN
1.5000 g | INTRAVENOUS | Status: DC
  Filled 2015-02-07: qty 1.5

## 2015-02-07 MED ORDER — BACITRACIN ZINC 500 UNIT/GM EX OINT
TOPICAL_OINTMENT | CUTANEOUS | Status: AC
  Filled 2015-02-07: qty 28.35

## 2015-02-07 MED ORDER — FENTANYL CITRATE (PF) 100 MCG/2ML IJ SOLN
INTRAMUSCULAR | Status: DC | PRN
  Administered 2015-02-07 (×5): 50 ug via INTRAVENOUS

## 2015-02-07 MED ORDER — IOHEXOL 300 MG/ML  SOLN
INTRAMUSCULAR | Status: DC | PRN
  Administered 2015-02-07: 20 mL via INTRAVENOUS

## 2015-02-07 MED ORDER — PREDNISONE 20 MG PO TABS
20.0000 mg | ORAL_TABLET | Freq: Every day | ORAL | Status: DC
  Administered 2015-02-07 – 2015-02-11 (×5): 20 mg via ORAL
  Filled 2015-02-07 (×5): qty 1

## 2015-02-07 MED ORDER — CLOPIDOGREL BISULFATE 75 MG PO TABS
75.0000 mg | ORAL_TABLET | Freq: Every day | ORAL | Status: DC
  Administered 2015-02-08 – 2015-02-10 (×3): 75 mg via ORAL
  Filled 2015-02-07 (×4): qty 1

## 2015-02-07 MED ORDER — PAPAVERINE HCL 30 MG/ML IJ SOLN
INTRAMUSCULAR | Status: DC | PRN
  Administered 2015-02-07: 60 mg via INTRAVENOUS

## 2015-02-07 SURGICAL SUPPLY — 93 items
BANDAGE ELASTIC 4 VELCRO ST LF (GAUZE/BANDAGES/DRESSINGS) ×6 IMPLANT
BANDAGE ESMARK 6X9 LF (GAUZE/BANDAGES/DRESSINGS) IMPLANT
BLADE AVERAGE 25MMX9MM (BLADE)
BLADE AVERAGE 25X9 (BLADE) IMPLANT
BLADE LONG MED 31MMX9MM (MISCELLANEOUS) ×2
BLADE LONG MED 31X9 (MISCELLANEOUS) ×6 IMPLANT
BLADE SURG 10 STRL SS (BLADE) ×2 IMPLANT
BNDG CMPR 9X6 STRL LF SNTH (GAUZE/BANDAGES/DRESSINGS) ×4
BNDG CONFORM 2 STRL LF (GAUZE/BANDAGES/DRESSINGS) ×2 IMPLANT
BNDG CONFORM 3 STRL LF (GAUZE/BANDAGES/DRESSINGS) ×6 IMPLANT
BNDG ESMARK 6X9 LF (GAUZE/BANDAGES/DRESSINGS) ×6
BNDG GAUZE ELAST 4 BULKY (GAUZE/BANDAGES/DRESSINGS) ×6 IMPLANT
CANISTER SUCTION 2500CC (MISCELLANEOUS) ×6 IMPLANT
CANNULA VESSEL 3MM 2 BLNT TIP (CANNULA) ×12 IMPLANT
CATH EMB 3FR 40CM (CATHETERS) ×2 IMPLANT
CLIP TI MEDIUM 24 (CLIP) ×6 IMPLANT
CLIP TI WIDE RED SMALL 24 (CLIP) ×12 IMPLANT
COVER SURGICAL LIGHT HANDLE (MISCELLANEOUS) ×6 IMPLANT
CUFF TOURNIQUET SINGLE 18IN (TOURNIQUET CUFF) IMPLANT
CUFF TOURNIQUET SINGLE 24IN (TOURNIQUET CUFF) ×2 IMPLANT
CUFF TOURNIQUET SINGLE 34IN LL (TOURNIQUET CUFF) IMPLANT
CUFF TOURNIQUET SINGLE 44IN (TOURNIQUET CUFF) IMPLANT
DRAIN CHANNEL 15F RND FF W/TCR (WOUND CARE) ×4 IMPLANT
DRAPE EXTREMITY T 121X128X90 (DRAPE) ×6 IMPLANT
DRAPE PROXIMA HALF (DRAPES) IMPLANT
DRAPE X-RAY CASS 24X20 (DRAPES) ×2 IMPLANT
DRSG COVADERM 4X10 (GAUZE/BANDAGES/DRESSINGS) IMPLANT
DRSG COVADERM 4X8 (GAUZE/BANDAGES/DRESSINGS) IMPLANT
DRSG OPSITE 6X11 MED (GAUZE/BANDAGES/DRESSINGS) ×2 IMPLANT
DRSG VAC ATS SM SENSATRAC (GAUZE/BANDAGES/DRESSINGS) ×2 IMPLANT
ELECT REM PT RETURN 9FT ADLT (ELECTROSURGICAL) ×6
ELECTRODE REM PT RTRN 9FT ADLT (ELECTROSURGICAL) ×4 IMPLANT
EVACUATOR SILICONE 100CC (DRAIN) ×4 IMPLANT
GAUZE SPONGE 4X4 12PLY STRL (GAUZE/BANDAGES/DRESSINGS) ×6 IMPLANT
GAUZE SPONGE 4X4 16PLY XRAY LF (GAUZE/BANDAGES/DRESSINGS) ×4 IMPLANT
GLOVE BIO SURGEON STRL SZ 6.5 (GLOVE) ×7 IMPLANT
GLOVE BIO SURGEON STRL SZ7.5 (GLOVE) ×8 IMPLANT
GLOVE BIO SURGEONS STRL SZ 6.5 (GLOVE) ×7
GLOVE BIOGEL PI IND STRL 6.5 (GLOVE) IMPLANT
GLOVE BIOGEL PI IND STRL 7.0 (GLOVE) IMPLANT
GLOVE BIOGEL PI IND STRL 8 (GLOVE) ×4 IMPLANT
GLOVE BIOGEL PI INDICATOR 6.5 (GLOVE) ×12
GLOVE BIOGEL PI INDICATOR 7.0 (GLOVE) ×18
GLOVE BIOGEL PI INDICATOR 8 (GLOVE) ×2
GLOVE ECLIPSE 6.5 STRL STRAW (GLOVE) ×14 IMPLANT
GOWN STRL REUS W/ TWL LRG LVL3 (GOWN DISPOSABLE) ×12 IMPLANT
GOWN STRL REUS W/TWL LRG LVL3 (GOWN DISPOSABLE) ×66
KIT BASIN OR (CUSTOM PROCEDURE TRAY) ×6 IMPLANT
KIT ROOM TURNOVER OR (KITS) ×6 IMPLANT
LIQUID BAND (GAUZE/BANDAGES/DRESSINGS) ×2 IMPLANT
LOOP VESSEL MAXI BLUE (MISCELLANEOUS) ×2 IMPLANT
LOOP VESSEL MINI RED (MISCELLANEOUS) ×2 IMPLANT
MARKER GRAFT CORONARY BYPASS (MISCELLANEOUS) ×2 IMPLANT
NDL 18GX1X1/2 (RX/OR ONLY) (NEEDLE) IMPLANT
NEEDLE 18GX1X1/2 (RX/OR ONLY) (NEEDLE) ×6 IMPLANT
NS IRRIG 1000ML POUR BTL (IV SOLUTION) ×12 IMPLANT
PACK GENERAL/GYN (CUSTOM PROCEDURE TRAY) ×6 IMPLANT
PACK PERIPHERAL VASCULAR (CUSTOM PROCEDURE TRAY) ×6 IMPLANT
PAD ARMBOARD 7.5X6 YLW CONV (MISCELLANEOUS) ×12 IMPLANT
PAD NEG PRESSURE SENSATRAC (MISCELLANEOUS) ×2 IMPLANT
PADDING CAST COTTON 6X4 STRL (CAST SUPPLIES) IMPLANT
SET COLLECT BLD 21X3/4 12 (NEEDLE) ×2 IMPLANT
SPECIMEN JAR SMALL (MISCELLANEOUS) ×6 IMPLANT
SPONGE GAUZE 4X4 12PLY STER LF (GAUZE/BANDAGES/DRESSINGS) ×2 IMPLANT
SPONGE LAP 18X18 X RAY DECT (DISPOSABLE) ×6 IMPLANT
SPONGE SURGIFOAM ABS GEL 100 (HEMOSTASIS) ×2 IMPLANT
STAPLER VISISTAT (STAPLE) IMPLANT
STOPCOCK 4 WAY LG BORE MALE ST (IV SETS) ×2 IMPLANT
SUT ETHILON 3 0 PS 1 (SUTURE) ×12 IMPLANT
SUT PROLENE 5 0 C 1 24 (SUTURE) ×12 IMPLANT
SUT PROLENE 6 0 BV (SUTURE) ×22 IMPLANT
SUT PROLENE 7 0 BV 1 (SUTURE) IMPLANT
SUT SILK 2 0 (SUTURE) ×6
SUT SILK 2 0 SH (SUTURE) ×6 IMPLANT
SUT SILK 2-0 18XBRD TIE 12 (SUTURE) IMPLANT
SUT SILK 3 0 (SUTURE) ×12
SUT SILK 3-0 18XBRD TIE 12 (SUTURE) IMPLANT
SUT VIC AB 2-0 CTB1 (SUTURE) ×12 IMPLANT
SUT VIC AB 3-0 SH 27 (SUTURE) ×36
SUT VIC AB 3-0 SH 27X BRD (SUTURE) ×8 IMPLANT
SUT VICRYL 4-0 PS2 18IN ABS (SUTURE) ×16 IMPLANT
SWAB COLLECTION DEVICE MRSA (MISCELLANEOUS) IMPLANT
SWAB CULTURE ESWAB REG 1ML (MISCELLANEOUS) ×2 IMPLANT
SYR 3ML LL SCALE MARK (SYRINGE) ×2 IMPLANT
SYR TB 1ML LUER SLIP (SYRINGE) ×2 IMPLANT
SYRINGE 10CC LL (SYRINGE) ×2 IMPLANT
TAPE CLOTH SURG 4X10 WHT LF (GAUZE/BANDAGES/DRESSINGS) ×2 IMPLANT
TOWEL OR 17X24 6PK STRL BLUE (TOWEL DISPOSABLE) ×2 IMPLANT
TRAY FOLEY W/METER SILVER 16FR (SET/KITS/TRAYS/PACK) ×6 IMPLANT
TUBE ANAEROBIC SPECIMEN COL (MISCELLANEOUS) IMPLANT
TUBING EXTENTION W/L.L. (IV SETS) ×2 IMPLANT
UNDERPAD 30X30 INCONTINENT (UNDERPADS AND DIAPERS) ×6 IMPLANT
WATER STERILE IRR 1000ML POUR (IV SOLUTION) ×6 IMPLANT

## 2015-02-07 NOTE — Anesthesia Preprocedure Evaluation (Addendum)
Anesthesia Evaluation  Patient identified by MRN, date of birth, ID band Patient awake    Reviewed: Allergy & Precautions, H&P , NPO status , Patient's Chart, lab work & pertinent test results  History of Anesthesia Complications Negative for: history of anesthetic complications  Airway Mallampati: III  TM Distance: >3 FB Neck ROM: Full    Dental  (+) Edentulous Upper, Poor Dentition,    Pulmonary neg shortness of breath, neg sleep apnea, COPD (PFTs show mild obstructive disease), neg recent URI, Current Smoker,    breath sounds clear to auscultation       Cardiovascular hypertension, Pt. on medications + CAD and + Peripheral Vascular Disease  (-) Past MI, (-) CHF and (-) DOE (-) dysrhythmias (-) Cardiac Defibrillator  Rhythm:Regular     Neuro/Psych Back pain with both leg symptoms  Neuromuscular disease negative psych ROS   GI/Hepatic Neg liver ROS, GERD  Medicated and Controlled,  Endo/Other  diabetes, Type 2, Oral Hypoglycemic Agents  Renal/GU Renal InsufficiencyRenal disease     Musculoskeletal  (+) Arthritis , Osteoarthritis,    Abdominal   Peds  Hematology  (+) anemia ,   Anesthesia Other Findings   Reproductive/Obstetrics                            Anesthesia Physical  Anesthesia Plan  ASA: III  Anesthesia Plan: General   Post-op Pain Management:    Induction: Intravenous  Airway Management Planned: Oral ETT  Additional Equipment: None  Intra-op Plan:   Post-operative Plan: Possible Post-op intubation/ventilation  Informed Consent: I have reviewed the patients History and Physical, chart, labs and discussed the procedure including the risks, benefits and alternatives for the proposed anesthesia with the patient or authorized representative who has indicated his/her understanding and acceptance.   Dental advisory given  Plan Discussed with: CRNA and  Anesthesiologist  Anesthesia Plan Comments: (Current smoker and obstructive lung disease with chronic bronchitis, will give breathing treatment preop, possibility of post op CPAP or vent support needed)       Anesthesia Quick Evaluation

## 2015-02-07 NOTE — Transfer of Care (Signed)
Immediate Anesthesia Transfer of Care Note  Patient: Marc [name]  Procedure(s) Performed: Procedure(s): BYPASS GRAFT FEMORAL-PERONEAL Trunk WITH VEIN graft  right leg. (Right) GREAT TOE RAY AMPUTATION RIGHT  (Right)  APPLICATION OF WOUND VAC right great toe amputation site. (Right) INTRA OPERATIVE ARTERIOGRAM (Right) ENDARTERECTOMY TIBIOPERONEAL (Right) Vein PATCH ANGIOPLASTY to tibioperoneal trunk (Right)  Patient Location: PACU  Anesthesia Type:General  Level of Consciousness: awake and patient cooperative  Airway & Oxygen Therapy: Patient Spontanous Breathing and Patient connected to nasal cannula oxygen  Post-op Assessment: Report given to RN, Post -op Vital signs reviewed and stable, Patient moving all extremities X 4 and Patient able to stick tongue midline  BP 91/51.  Pt getting LR bolus. Dr Jillyn Hidden at bedside and aware.  RN is ok with this   Post vital signs: Reviewed and stable  Last Vitals:  Filed Vitals:   02/07/15 0634  BP: 149/61  Pulse: 51  Temp: 36.2 C  Resp: 18    Complications: No apparent anesthesia complications

## 2015-02-07 NOTE — H&P (View-Only) (Signed)
HISTORY AND PHYSICAL     CC:  Non healing wound Referring Provider:  [name], *  HPI: This is a 75 y.o. male who saw Dr. Scot Dock earlier this year for PAD.  At that time, he had underwent a lower extremity vascular study which showed evidence of an occlusion of his right distal femoral artery.    He was noted to have claudication, and he continues to have this and it has not changed since previous visit.  He does have a hx of bilateral iliac stenting by Dr. Gwenlyn Found in the past.   He states that the week before Christmas, he was having swelling in his right foot, which got better.  This happened twice.  Then it swelled again without relief.  He thought this was gout.  He did take anti-inflammatories without relief.   He states that he got a crack in his foot and it has progressed to a larger wound.  He states that he is diabetic.  He is having pain and redness in his right foot. He subsequently got admitted to the hospital and had IV abx.  He continues to be on a po antibiotic.  Ortho did debride his foot while in the hospital.    He is on amaryl for his DM.  He is on an ACEI for BP management.  He is on a statin and baby aspirin.    Past Medical History  Diagnosis Date  . Hypertension   . Ulcer disease   . Colon polyp   . Gout   . Bradycardia   . Hypercholesteremia   . PAD (peripheral artery disease) ([place])   . Chronic bronchitis ([place])   . Type II diabetes mellitus ([place])   . History of GI bleed 2007  . Gout   . Tobacco abuse disorder 03/10/2012  . GERD (gastroesophageal reflux disease)   . Pneumonia     denies  . ARF (acute renal failure) ([place]) 12/2014    Past Surgical History  Procedure Laterality Date  . Splenectomy  ~ 1957  . Anterior cervical decomp/discectomy fusion  ~ 2007  . Upper endoscopy w/ sclerotherapy  ~ 2007  . Polypectomy    . Colonoscopy    . Iliac artery stent Left     left common/notes (02/04/2012)  . Hernia repair  ~ 2007    UHR (02/04/2012)  .  Tonsillectomy  ~ 1947  . Lumbar laminectomy  11/14  . Total knee arthroplasty Right 02/03/2013    Procedure: TOTAL KNEE ARTHROPLASTY;  Surgeon: [name], MD;  Location: [facility];  Service: Orthopedics;  Laterality: Right;  . Lower extremity angiogram N/A 02/04/2012    Procedure: LOWER EXTREMITY ANGIOGRAM;  Surgeon: Lorretta Harp, MD;  Location[facility] CATH LAB;  Service: Cardiovascular;  Laterality: N/A;  . Percutaneous stent intervention Left 02/04/2012    Procedure: PERCUTANEOUS STENT INTERVENTION;  Surgeon: Lorretta Harp, MD;  Location: [facility] CATH LAB;  Service: Cardiovascular;  Laterality: Left;  lt ext iliac stent  . Replacement total knee Right   . Back surgery    . Anterior lat lumbar fusion Left 01/19/2014    Procedure: LATERAL INTERBODY FUSION 1 LEVEL;  Surgeon: Sinclair Ship, MD;  Location: [place];  Service: Orthopedics;  Laterality: Left;  Left lumbar 3-4 lateral interbody fusion with instrumentation, allograft    No Known Allergies  Current Outpatient Prescriptions  Medication Sig Dispense Refill  . allopurinol (ZYLOPRIM) 300 MG tablet Take 300 mg by mouth daily.    [name]  amoxicillin-clavulanate (AUGMENTIN) 875-125 MG tablet Take 1 tablet by mouth 2 (two) times daily. 10 tablet 0  . aspirin EC 81 MG tablet Take 81 mg by mouth daily.    . clopidogrel (PLAVIX) 75 MG tablet Take 1 tablet by mouth daily.    . colchicine 0.6 MG tablet Take 1 tablet (0.6 mg total) by mouth daily. 10 tablet 0  . collagenase (SANTYL) ointment Apply topically daily. 15 g 0  . doxycycline (MONODOX) 100 MG capsule Take 1 capsule (100 mg total) by mouth 2 (two) times daily. 20 capsule 0  . furosemide (LASIX) 80 MG tablet Take 40 mg by mouth daily.     [name] gabapentin (NEURONTIN) 600 MG tablet Take 1,200 mg by mouth every 8 (eight) hours.     [name] glimepiride (AMARYL) 4 MG tablet Take 4 mg by mouth 2 (two) times daily.     [name] lisinopril (PRINIVIL,ZESTRIL) 20 MG tablet Take 1 tablet (20 mg total) by mouth daily. 30  tablet 0  . lovastatin (MEVACOR) 40 MG tablet Take 40 mg by mouth at bedtime.    [name] omeprazole (PRILOSEC OTC) 20 MG tablet Take 40 mg by mouth daily.     [name] oxyCODONE-acetaminophen (PERCOCET/ROXICET) 5-325 MG tablet Take 1 tablet by mouth every 4 (four) hours as needed for moderate pain or severe pain. 30 tablet 0  . pantoprazole (PROTONIX) 40 MG tablet Take 1 tablet (40 mg total) by mouth daily. 30 tablet 0   No current facility-administered medications for this visit.    Family History  Problem Relation Age of Onset  . Colon cancer Paternal Uncle     Uncle  . Heart disease Father   . Hypertension Father   . Heart attack Father   . Heart disease Mother   . Diabetes Mother   . Hypertension Mother     Social History   Social History  . Marital Status: Legally Separated    Spouse Name: N/A  . Number of Children: 2  . Years of Education: N/A   Occupational History  . retired   . cattle farm     owner   Social History Main Topics  . Smoking status: Current Every Day Smoker -- 2.00 packs/day for 58 years    Types: Cigarettes  . Smokeless tobacco: Former Systems developer    Types: Chew  . Alcohol Use: 0.0 oz/week    0 Standard drinks or equivalent per week     Comment:  quit for 22 yr; egg nog christmas  . Drug Use: No  . Sexual Activity: No   Other Topics Concern  . Not on file   Social History Narrative   Patient lives at home with his spouse.   Caffeine Use: 2 cups daily     ROS: [x]  Positive   [ ]  Negative   [ ]  All sytems reviewed and are negative  Cardiovascular: []  chest pain/pressure []  palpitations []  SOB lying flat []  DOE [x]  pain in legs while walking [x]  pain in feet when lying flat []  hx of DVT []  hx of phlebitis [x]  swelling in legs []  varicose veins  Pulmonary: []  productive cough []  asthma []  wheezing  Neurologic: []  weakness in []  arms []  legs []  numbness in []  arms []  legs [] difficulty speaking or slurred speech []  temporary loss of vision in  one eye []  dizziness  Hematologic: []  bleeding problems [x]  problems with blood clotting easily  GI []  vomiting blood []  blood in stool  GU: []  burning with  urination []  blood in urine  Psychiatric: []  hx of major depression  Integumentary: []  rashes []  ulcers  Constitutional: []  fever []  chills   PHYSICAL EXAMINATION:  Filed Vitals:   02/01/15 1456 02/01/15 1505  BP: 158/76 142/80  Pulse: 58 59  Temp: 97.7 F (36.5 C)   Resp: 16    Body mass index is 25.07 kg/(m^2).  General:  WDWN in NAD Gait: Not observed HENT: WNL, normocephalic Pulmonary: normal non-labored breathing , without Rales, rhonchi,  wheezing Cardiac: RRR, without  Murmurs, rubs or gallops; without carotid bruits Abdomen: soft, NT, no masses Skin: without rashes Vascular Exam/Pulses: He has a 4cmx5cm on the ball of the right foot with tendon exposed.  There is eschar on the tip of the right great toe; the right foot is with edema and erythema. Extremities: non palpable pedal pulses Musculoskeletal: no muscle wasting or atrophy  Neurologic: A&O X 3; Appropriate Affect ; SENSATION: normal; MOTOR FUNCTION:  moving all extremities equally. Speech is fluent/normal   Non-Invasive Vascular Imaging:   ABI's 02/01/15: Right:  0.42 Left:  0.62  Pt meds includes: Statin:  Yes.   Beta Blocker:  No. Aspirin:  Yes.   ACEI:  Yes.   ARB:  No. Other Antiplatelet/Anticoagulant:  Yes.   Plavix   ASSESSMENT/PLAN:: 75 y.o. male with hx of PAD now with limb threatening wound on the right foot.   -will plan for aortogram on Friday 02/03/15 with possible intervention.  If unable to intervene, will plan for right leg bypass on Tuesday 02/07/15.  -Dr. Scot Dock did discuss with the pt that this is limb threatening. -pt will continue his po abx.   Leontine Locket, PA-C [facility] [phone]  Clinic MD:  Pt seen and examined in conjunction with Dr. Scot Dock  Agree with above. This patient  has a very extensive wound involving the plantar aspect of his right great toe with exposed tendon. In addition he has evidence of multilevel arterial occlusive disease and diabetes. This is clearly a limb threatening situation. He is also a smoker. I think his only chance for limb salvage would be to proceed with arteriography in order to determine if he has any options for revascularization. I have reviewed with the patient the indications for arteriography. In addition, I have reviewed the potential complications of arteriography including but not limited to: Bleeding, arterial injury, arterial thrombosis, dye action, renal insufficiency, or other unpredictable medical problems. I have explained to the patient that if we find disease amenable to angioplasty we could potentially address this at the same time. I have discussed the potential complications of angioplasty and stenting, including but not limited to: Bleeding, arterial thrombosis, arterial injury, dissection, or the need for surgical intervention.  His arteriogram is scheduled for tomorrow, 02/02/2015. I tentatively have him scheduled for a right lower extremity bypass on 02/07/2015 if he is not a candidate for an endovascular approach. If he does not have any options for revascularization he would likely require a below-the-knee amputation. The patient has made it clear to me that he he wants the most definitive procedure if possible. In addition, although he has previously had arteriography by Dr. Quay Burow patient has requested that I do his arteriogram.  Deitra Mayo, MD, FACS Beeper [phone] Office: [phone]

## 2015-02-07 NOTE — Anesthesia Procedure Notes (Signed)
Procedure Name: Intubation Date/Time: 02/07/2015 7:39 AM Performed by: Shirlyn Goltz Pre-anesthesia Checklist: Patient identified, Emergency Drugs available, Suction available and Patient being monitored Patient Re-evaluated:Patient Re-evaluated prior to inductionOxygen Delivery Method: Circle system utilized Preoxygenation: Pre-oxygenation with 100% oxygen Intubation Type: IV induction Ventilation: Mask ventilation without difficulty Laryngoscope Size: Mac and 4 Grade View: Grade I Tube type: Oral Tube size: 7.5 mm Number of attempts: 1 Airway Equipment and Method: Stylet Placement Confirmation: ETT inserted through vocal cords under direct vision,  positive ETCO2 and breath sounds checked- equal and bilateral Secured at: 23 cm Tube secured with: Tape Dental Injury: Teeth and Oropharynx as per pre-operative assessment

## 2015-02-07 NOTE — Interval H&P Note (Signed)
History and Physical Interval Note:  02/07/2015 7:25 AM  Marc [name]  has presented today for surgery, with the diagnosis of Peripheral Vascular Disease with nonhealing wound right foot  I70.234  The various methods of treatment have been discussed with the patient and family. After consideration of risks, benefits and other options for treatment, the patient has consented to  Procedure(s): BYPASS GRAFT FEMORAL-PERONEAL WITH VEIN  (Right) GREAT TOE RAY AMPUTATION RIGHT  (Right) POSSIBLE AMPUTATION 2ND TOE RIGHT (Right) POSSIBLE APPLICATION OF WOUND VAC (Right) as a surgical intervention .  The patient's history has been reviewed, patient examined, no change in status, stable for surgery.  I have reviewed the patient's chart and labs.  Questions were answered to the patient's satisfaction.     Deitra Mayo

## 2015-02-07 NOTE — Op Note (Signed)
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 Right common femoral artery to tibial peroneal trunk artery bypass with non-reverse translocated right great saphenous vein 2. Intraoperative arteriogram 3. Endarterectomy of tibial peroneal trunk with vein patch angioplasty 4.  Ray amputation of right great toe and placement of VAC  SURGEON: Christopher S. Scot Dock, MD, FACS  ASSIST: Silva Bandy, [facility]  ANESTHESIA: Gen.   EBL: per anesthesia record  INDICATIONS: Marc [name] is a 75 y.o. male who presented with an extensive wound of his right great toe and metatarsal head. He underwent an arteriogram which showed severe infrainguinal arterial occlusive disease. He had occlusion of the right superficial femoral artery at its origin with reconstitution of the tibial peroneal trunk with single-vessel runoff via the peroneal artery.  FINDINGS: intraoperative arteriogram was obtained because I was not happy with the signal just below the anastomosis. This showed some plaque with possible dissection low the anastomosis and therefore elected to explore this. This was patched with a vein patch after endarterectomy was performed.  TECHNIQUE: the patient was taken to the operating room and received a general anesthetic. The right lower extremity was prepped and draped in usual sterile fashion. A longitudinal incision was made in the right groin and through this incision the common femoral, superficial femoral, and deep femoral arteries were dissected free and controlled with vessel loops. The patient has a stent in his right distal external iliac artery but I was able to clamp below the stent. Through this incision the saphenofemoral junction was dissected free.  Through 4 additional incisions along the  medial aspect of the right leg the great saphenous vein was harvested to the mid calf. Branches were divided between clips and 3-0 silk ties. Through the distal incision the gastrocnemius muscle was retracted and the popliteal vein retracted posteriorly. This allowed access to the tibial peroneal trunk after the soleus muscle was divided. To be patent this level.  A tunnel was then created from the distal incision to the groin incision and the patient was heparinized. The great saphenous vein was ligated distally and then removed entirely from its bed. The saphenofemoral junction was clamped and the saphenous vein excised from the femoral vein. The femoral vein was oversewn with a 5-0 Prolene suture. The proximal valve was sharply excised.  The common femoral artery, deep femoral artery and superficial femoral arteries were controlled. A longitudinal arteriotomy was made in the distal common femoral artery. The vein was opened and used in a non-reverse fashion. It was sewn end-to-side to the common femoral artery using continuous 5-0 Prolene suture. A radiopaque marker was placed proximally. The inflow was tested and there was excellent inflow and the anastomosis was completed. Using a retrograde [place] valvulotome the valves were sharply excised. Excellent flow was established to the vein graft which was flushed with heparinized saline and clipped. It was then brought to the previously created tunnel after it was marked to prevent twisting. Tourniquet was placed on the thigh.  The leg was exsanguinated with an Esmarch bandage and the tourniquet inflated to 300 mmHg. Under tourniquet control, a longitudinal arteriotomy was made in the tibial peroneal trunk and the artery was patent here. I extended the arteriotomy fairly low and appeared to have good distal visualization. The vein was then sewn end-to-side  to the arteriotomy using continuous 6-0 Prolene suture. Prior to completing the anastomosis the tourniquet  was released. The artery was backbled and flushed appropriately and the anastomosis completed. Using the Doppler signal distal to the anastomosis was somewhat weak and therefore I shot an intraoperative arteriogram although I initially did not plan to given his elevated creatinine. Age 48 cc of contrast. This demonstrated some disease below the anastomosis and possible dissection. I elected to explore this area. The patient was re-heparinized. The leg was exsanguinated and the tourniquet inflated to 300 mmHg. I dissected out the peroneal artery proximally and all of the tibial peroneal trunk including the proximal posterior tibial artery. A longitudinal arteriotomy was made distal to the anastomosis and there was some dissected plaque here and therefore extended this into the anastomosis area and the side to the vein graft were tacked with 6-0 Prolene. The plaque was endarterectomized. The peroneal artery was patent distally. The vein patch was then sewn using the remaining segment of vein from the great saphenous vein. This was opened longitudinally. The vein patch was sewn using 2 continuous 6-0 Prolene sutures. Prior to completing this anastomosis the arteries were backbled and flushed properly after the tourniquet was released. The anastomosis was completed.  This point was an excellent peroneal signal with the Doppler and also a posterior tibial signal from collaterals. Hemostasis was obtained in the wounds. #15 Blake drains were placed in the groin incision and the distal incision. The groin wound was closed with 2 deep layers of 2-0 Vicryl and the skin closed with 4-0 Vicryl. The vein harvest incisions were closed with a deep layer of 3-0 Vicryl and the skin closed with 4-0 Vicryl. The liquiband was applied.  Next attention was turned to the right foot. A tennis racquet incision was made encompassing the right great toe and the dissection carried down to the metatarsal head. Periosteum was elevated. This  was divided using a Cipro cleaning saw. The entire great toe was removed. There was excellent bleeding which was controlled with electrocautery. The most posterior aspect of the incision was closed with a deep layer of 3-0 Vicryl and the skin closed with interrupted 3-0 nylon's. Negative pressure dressing was placed on the remainder of the incision of the as there was too much tension to close this. This wound measured 7 cm in length and 4 cm in width. There was a good seal at the completion. The patient tolerated the procedure well transferred to the recovery in stable condition. All needle and sponge counts were correct.  Deitra Mayo, MD, FACS [facility]  DATE OF DICTATION:   02/07/2015

## 2015-02-08 ENCOUNTER — Encounter (HOSPITAL_COMMUNITY): Payer: Self-pay | Admitting: Vascular Surgery

## 2015-02-08 ENCOUNTER — Inpatient Hospital Stay (HOSPITAL_COMMUNITY): Payer: Medicare Other

## 2015-02-08 DIAGNOSIS — I70261 Atherosclerosis of native arteries of extremities with gangrene, right leg: Secondary | ICD-10-CM

## 2015-02-08 MED ORDER — AMOXICILLIN-POT CLAVULANATE 875-125 MG PO TABS
1.0000 | ORAL_TABLET | Freq: Two times a day (BID) | ORAL | Status: DC
  Administered 2015-02-09 – 2015-02-10 (×3): 1 via ORAL
  Filled 2015-02-08 (×3): qty 1

## 2015-02-08 NOTE — Progress Notes (Addendum)
   VASCULAR SURGERY ASSESSMENT & PLAN:  * 1 Day Post-Op s/p: Right Fem-TPT Bypass with vein and TPT endarterectomy and VPA.  *  Excellent peroneal signal with doppler which is his only runoff.  * VAC with good seal. Change TTS.  * Will need discharge planner to help arrange for VAC at home.  * D/C JP's  * Transfer to 2W  * PTx consult. Ambulate. PWB right foot heel only with Darco shoe.  SUBJECTIVE: Pain well controlled.  PHYSICAL EXAM: Filed Vitals:   02/08/15 0000 02/08/15 0100 02/08/15 0200 02/08/15 0347  BP: 129/83 99/45 101/48   Pulse:      Temp:    98 F (36.7 C)  TempSrc:    Axillary  Resp: 14 11 12    Height:      Weight:      SpO2: 100% 100% 99%    JP 1 = 15 cc   JP 2 = 10 cc  VAC = 40 cc  Brisk Peroneal signal with doppler Incisions look fine  LABS: Lab Results  Component Value Date   WBC 10.7* 02/07/2015   HGB 10.7* 02/07/2015   HCT 33.5* 02/07/2015   MCV 99.7 02/07/2015   PLT 280 02/07/2015   Lab Results  Component Value Date   CREATININE 1.85* 02/07/2015   Lab Results  Component Value Date   INR 1.12 02/07/2015   CBG (last 3)   Recent Labs  02/07/15 0616 02/07/15 1436 02/07/15 1842  GLUCAP 90 125* 156*    Active Problems:   Atherosclerosis of native arteries of extremities with gangrene, right leg [facility])   Gae Gallop [id number] 02/08/2015

## 2015-02-08 NOTE — Progress Notes (Signed)
Utilization review completed. Kenai Fluegel, RN, BSN. 

## 2015-02-08 NOTE — Progress Notes (Signed)
Inpatient Rehabilitation  PT is recommending IP Rehab.  It is highly unlikely that pt's [facility] Medicare would approve and IP Rehab stay with pt's given diagnoses.  At this time, we are not recommending IP Rehab consult, however if MD would like [place] to further pursue, we would be happy to do so.  Please call if questions.  [place] Admissions Coordinator Cell [phone] Office [phone]

## 2015-02-08 NOTE — Progress Notes (Signed)
OT Cancellation Note  Patient Details Name: Marc [name] MRN: [id number] DOB: 09-08-1940   Cancelled Treatment:    Reason Eval/Treat Not Completed: Patient at procedure or test/ unavailable (in vascular)  [place], OTR/L  [phone] 02/08/2015 02/08/2015, 2:27 PM

## 2015-02-08 NOTE — Progress Notes (Signed)
Report attempted x2

## 2015-02-08 NOTE — Care Management Note (Signed)
Case Management Note Marvetta Gibbons RN, BSN Unit 2W-Case Manager [phone]  Patient Details  Name: EAIN LANSER MRN: [id number] Date of Birth: 01-09-1941  Subjective/Objective:   Pt admitted s/p: Right Fem-TPT Bypass with vein and TPT endarterectomy and VPA.  - wound VAC in place                  Action/Plan: PTA pt lived at home- was active with [facility] for HH-RN/PT/OT/SW- will need resumption orders on d/c if plan is to return home- received order for home wound VAC- have placed [facility] order form on shadow chart for signature- will f/u once signed and start process. Awaiting PT/OT evals for recommendations,   Expected Discharge Date:                  Expected Discharge Plan:  [place]  In-House Referral:     Discharge planning Services  CM Consult  Post Acute Care Choice:  Durable Medical Equipment, Home Health, Resumption of Svcs/PTA Provider Choice offered to:  Patient  DME Arranged:  Vac DME Agency:  [place] Arranged:  RN, PT, OT, Social Work [employer] Agency:  [place]  Status of Service:  In process, will continue to follow  Medicare Important Message Given:    Date Medicare IM Given:    Medicare IM give by:    Date Additional Medicare IM Given:    Additional Medicare Important Message give by:     If discussed at [place] of Stay Meetings, dates discussed:    Additional Comments:  Dawayne Patricia, RN 02/08/2015, 3:18 PM

## 2015-02-08 NOTE — Progress Notes (Addendum)
VASCULAR LAB PRELIMINARY  ARTERIAL  ABI completed: Bilaterally moderate arterial disease.    RIGHT    LEFT    PRESSURE WAVEFORM  PRESSURE WAVEFORM  BRACHIAL 133 Tri BRACHIAL 123 Tri  DP   DP    AT 73 Mono AT 71 Mono  PT 87 Mono PT 61 Mono  PER   PER    GREAT TOE  NA GREAT TOE  NA    RIGHT LEFT  ABI 0.65 0.53     Landry Mellow, RDMS, RVT  02/08/2015, 2:34 PM

## 2015-02-08 NOTE — Evaluation (Signed)
Physical Therapy Evaluation Patient Details Name: Marc [name] MRN: [id number] DOB: 1940/08/30 Today's Date: 02/08/2015   History of Present Illness  Pt is a 75 y/o M s/p Rt fem-TBT bypass, TPT endarectomy, and Rt great toe amputation. Pt's PMH includes gout, bradycardia, ACDF, Rt TKA, anterior lat lumbar fusion.  Clinical Impression  Patient is s/p above surgery resulting in functional limitations due to the deficits listed below (see PT Problem List). Marc [name] is very motivated to return to independence as soon as possible.  He has 24/7 assist available from his ex wife at d/c.  Recommending CIR to address pt's strength, mobility, and balance impairments. Patient will benefit from skilled PT to increase their independence and safety with mobility to allow discharge to the venue listed below.      Follow Up Recommendations CIR    Equipment Recommendations  Other (comment) (TBD)    Recommendations for Other Services OT consult     Precautions / Restrictions Precautions Precautions: Fall Required Braces or Orthoses: Other Brace/Splint Other Brace/Splint: Rt darco shoe Restrictions Weight Bearing Restrictions: Yes RLE Weight Bearing: Partial weight bearing RLE Partial Weight Bearing Percentage or Pounds:  (not specified in order, followed 50% this session)      Mobility  Bed Mobility Overal bed mobility: Needs Assistance Bed Mobility: Supine to Sit     Supine to sit: Min guard     General bed mobility comments: Cues for technique.  Increased time and pt relies heavily on bed rails to pull up to sitting.  Transfers Overall transfer level: Needs assistance Equipment used: Rolling walker (2 wheeled) Transfers: Sit to/from Stand Sit to Stand: Min assist         General transfer comment: Min assist to boost up to standing and to steady.  Cues for backing up to chair and reaching back for both armrests to sit.  Ambulation/Gait Ambulation/Gait assistance: Min  assist Ambulation Distance (Feet): 50 Feet Assistive device: Rolling walker (2 wheeled) Gait Pattern/deviations: Step-to pattern;Decreased stride length;Decreased stance time - right;Decreased step length - left;Decreased weight shift to right;Antalgic;Trunk flexed;Staggering left   Gait velocity interpretation: <1.8 ft/sec, indicative of risk for recurrent falls General Gait Details: Cues for positioning of RW while ambulating and for upright posture.  LOB x1 in room, min assist and pt grabs hold of bed footplate to steady.    Stairs            Wheelchair Mobility    Modified Rankin (Stroke Patients Only)       Balance Overall balance assessment: Needs assistance Sitting-balance support: Feet supported;No upper extremity supported Sitting balance-Leahy Scale: Fair     Standing balance support: Bilateral upper extremity supported;During functional activity Standing balance-Leahy Scale: Poor Standing balance comment: Relies on RW                              Pertinent Vitals/Pain Pain Assessment: 0-10 Pain Score:  (14-15/10 ) Pain Location: Rt LE Pain Descriptors / Indicators: Operative site guarding;Grimacing;Guarding;Moaning;Aching Pain Intervention(s): Limited activity within patient's tolerance;Monitored during session;Repositioned;Premedicated before session    Home Living Family/patient expects to be discharged to:: Private residence Living Arrangements: Alone Available Help at Discharge: Friend(s);Available 24 hours/day (ex wife) Type of Home: Mobile home Home Access: Stairs to enter Entrance Stairs-Rails: Left Entrance Stairs-Number of Steps: 6 Home Layout: Multi-level Home Equipment: Walker - 2 wheels;Cane - single point;Bedside commode;Walker - 4 wheels Additional Comments: 6 steps w/ Lt rail up  onto deck, 2 steps up to mud room w/o rail, then 1 step up into home w/o steps.  3 steps down to get to wood stove w/ Rt rail going down.      Prior  Function Level of Independence: Independent with assistive device(s)         Comments: cane at all times     Hand Dominance   Dominant Hand: Right    Extremity/Trunk Assessment   Upper Extremity Assessment: Defer to OT evaluation           Lower Extremity Assessment: RLE deficits/detail;LLE deficits/detail RLE Deficits / Details: limited ROM and strength as expected s/p Rt LE bypass    Cervical / Trunk Assessment: Kyphotic  Communication   Communication: No difficulties  Cognition Arousal/Alertness: Awake/alert Behavior During Therapy: WFL for tasks assessed/performed Overall Cognitive Status: Within Functional Limits for tasks assessed                      General Comments      Exercises        Assessment/Plan    PT Assessment Patient needs continued PT services  PT Diagnosis Difficulty walking;Acute pain   PT Problem List Decreased strength;Decreased range of motion;Decreased activity tolerance;Decreased balance;Decreased knowledge of use of DME;Decreased safety awareness;Decreased knowledge of precautions;Pain;Impaired sensation  PT Treatment Interventions DME instruction;Gait training;Stair training;Functional mobility training;Therapeutic activities;Therapeutic exercise;Balance training;Neuromuscular re-education;Patient/family education   PT Goals (Current goals can be found in the Care Plan section) Acute Rehab PT Goals Patient Stated Goal: to get back to independence PT Goal Formulation: With patient Time For Goal Achievement: 02/22/15 Potential to Achieve Goals: Good    Frequency Min 3X/week   Barriers to discharge Inaccessible home environment Multiple sets of stairs to enter home and inside home    Co-evaluation               End of Session Equipment Utilized During Treatment: Gait belt;Other (comment) (Rt darco shoe) Activity Tolerance: Patient limited by fatigue;Patient limited by pain Patient left: in chair;with call  bell/phone within reach Nurse Communication: Mobility status;Weight bearing status         Time: [id number] PT Time Calculation (min) (ACUTE ONLY): 40 min   Charges:   PT Evaluation $PT Eval Moderate Complexity: 1 Procedure PT Treatments $Gait Training: 8-22 mins $Therapeutic Activity: 8-22 mins   PT G Codes:       Marc [name] PT, DPT [phone] Pager: [phone] 02/08/2015, 12:57 PM

## 2015-02-09 NOTE — NC FL2 (Signed)
[place] LEVEL OF CARE SCREENING TOOL     IDENTIFICATION  Patient Name: Marc [name] Birthdate: Apr 20, 1940 Sex: male Admission Date (Current Location): 02/07/2015  [facility] and [place] Number:  Herbalist and Address:  [facility], [place] [address] [place], [place] [address]      Provider Number: [id number]  Attending Physician Name and Address:  Angelia [name], MD  Relative Name and Phone Number:       Current Level of Care: Hospital Recommended Level of Care: [place] Prior Approval Number:    Date Approved/Denied:   PASRR Number:    Discharge Plan: SNF    Current Diagnoses: Patient Active Problem List   Diagnosis Date Noted  . Atherosclerosis of native arteries of extremities with gangrene, right leg ([place]) 02/07/2015  . Atherosclerosis of native arteries of the extremities with gangrene ([place]) 02/02/2015  . Extremity atherosclerosis with gangrene ([place]) 02/02/2015  . Cellulitis 01/16/2015  . Acute renal failure superimposed on stage 3 chronic kidney disease ([place]) 01/16/2015  . SIRS (systemic inflammatory response syndrome) ([place]) 01/16/2015  . Acute on chronic kidney failure ([place])   . Type 2 diabetes mellitus with right diabetic foot infection ([place])   . CAD (coronary artery disease), native coronary artery   . Ascending aortic aneurysm ([place])   . Mass of lower lobe of left lung   . Chronic kidney disease stage 3   . Peripheral vascular disease with claudication ([place])   . Gout 02/07/2013  . Hyperlipidemia 02/07/2013  . GERD (gastroesophageal reflux disease) 02/07/2013  . Type 2 diabetes mellitus with peripheral neuropathy (H[facility])   . Spinal stenosis of lumbar region 01/14/2013  . Tobacco abuse disorder 03/10/2012  . Bradycardia:  Normal condition for this patient. Previously worked up. 02/05/2012  . Hx of peptic ulcer 05/21/2010  . Hx of adenomatous colonic polyps 05/21/2010  . Hypertensive  heart disease   . Lumbar disc disease     Orientation RESPIRATION BLADDER Height & Weight    Self, Time, Situation, Place  Normal Continent 6\' 1"  (185.4 cm) 187 lbs.  BEHAVIORAL SYMPTOMS/MOOD NEUROLOGICAL BOWEL NUTRITION STATUS      Continent Diet (cardiac)  AMBULATORY STATUS COMMUNICATION OF NEEDS Skin   Limited Assist Verbally Surgical wounds  wound vac                     Personal Care Assistance Level of Assistance  Bathing, Feeding, Dressing Bathing Assistance: Limited assistance Feeding assistance: Limited assistance Dressing Assistance: Limited assistance     Functional Limitations Info             SPECIAL CARE FACTORS FREQUENCY  PT (By licensed PT)     PT Frequency: 5/wk              Contractures      Additional Factors Info  Code Status, Allergies Code Status Info: FULL Allergies Info: NKA           Current Medications (02/09/2015):  This is the current hospital active medication list Current Facility-Administered Medications  Medication Dose Route Frequency Provider Last Rate Last Dose  . 0.9 %  sodium chloride infusion   Intravenous Continuous Alvia Grove, PA-C 50 mL/hr at 02/07/15 1826 50 mL/hr at 02/07/15 1826  . acetaminophen (TYLENOL) tablet 325-650 mg  325-650 mg Oral Q4H PRN Alvia Grove, PA-C       Or  . acetaminophen (TYLENOL) suppository 325-650 mg  325-650  mg Rectal Q4H PRN Alvia Grove, PA-C      . allopurinol (ZYLOPRIM) tablet 300 mg  300 mg Oral BID Alvia Grove, PA-C   300 mg at 02/09/15 1022  . alum & mag hydroxide-simeth (MAALOX/MYLANTA) 200-200-20 MG/5ML suspension 15-30 mL  15-30 mL Oral Q2H PRN Alvia Grove, PA-C      . amoxicillin-clavulanate (AUGMENTIN) 875-125 MG per tablet 1 tablet  1 tablet Oral BID Alvia Grove, PA-C   1 tablet at 02/09/15 1022  . aspirin EC tablet 81 mg  81 mg Oral QHS Alvia Grove, PA-C   81 mg at 02/08/15 2123  . atorvastatin (LIPITOR) tablet 10 mg  10 mg Oral QHS  Alvia Grove, PA-C   10 mg at 02/08/15 2123  . clopidogrel (PLAVIX) tablet 75 mg  75 mg Oral QHS Alvia Grove, PA-C   75 mg at 02/08/15 2123  . colchicine tablet 0.6 mg  0.6 mg Oral QHS Alvia Grove, PA-C   0.6 mg at 02/08/15 2124  . docusate sodium (COLACE) capsule 100 mg  100 mg Oral Daily Alvia Grove, PA-C   100 mg at 02/09/15 1022  . enoxaparin (LOVENOX) injection 30 mg  30 mg Subcutaneous Q24H Alvia Grove, PA-C   30 mg at 02/08/15 0844  . furosemide (LASIX) tablet 80 mg  80 mg Oral Daily Alvia Grove, PA-C   80 mg at 02/09/15 1022  . gabapentin (NEURONTIN) tablet 1,200 mg  1,200 mg Oral BID Alvia Grove, PA-C   1,200 mg at 02/09/15 1021  . glimepiride (AMARYL) tablet 4 mg  4 mg Oral BID Alvia Grove, PA-C   4 mg at 02/09/15 1022  . guaiFENesin-dextromethorphan (ROBITUSSIN DM) 100-10 MG/5ML syrup 15 mL  15 mL Oral Q4H PRN Alvia Grove, PA-C      . hydrALAZINE (APRESOLINE) injection 5 mg  5 mg Intravenous Q20 Min PRN Alvia Grove, PA-C      . labetalol (NORMODYNE,TRANDATE) injection 10 mg  10 mg Intravenous Q10 min PRN Alvia Grove, PA-C      . lisinopril (PRINIVIL,ZESTRIL) tablet 20 mg  20 mg Oral QHS Alvia Grove, PA-C   20 mg at 02/08/15 2123  . magnesium sulfate IVPB 2 g 50 mL  2 g Intravenous Daily PRN Alvia Grove, PA-C      . metoprolol (LOPRESSOR) injection 2-5 mg  2-5 mg Intravenous Q2H PRN Alvia Grove, PA-C      . morphine 2 MG/ML injection 2-5 mg  2-5 mg Intravenous Q1H PRN Alvia Grove, PA-C   4 mg at 02/09/15 1013  . ondansetron (ZOFRAN) injection 4 mg  4 mg Intravenous Q6H PRN Alvia Grove, PA-C      . oxyCODONE-acetaminophen (PERCOCET/ROXICET) 5-325 MG per tablet 1-2 tablet  1-2 tablet Oral Q4H PRN Alvia Grove, PA-C   2 tablet at 02/09/15 0900  . pantoprazole (PROTONIX) EC tablet 40 mg  40 mg Oral Daily Alvia Grove, PA-C   40 mg at 02/09/15 1021  . phenol (CHLORASEPTIC) mouth spray 1 spray  1 spray  Mouth/Throat PRN Alvia Grove, PA-C      . potassium chloride SA (K-DUR,KLOR-CON) CR tablet 20-40 mEq  20-40 mEq Oral Daily PRN Alvia Grove, PA-C      . predniSONE (DELTASONE) tablet 20 mg  20 mg Oral Daily Alvia Grove, PA-C   20 mg at 02/09/15 1022  Discharge Medications: Please see discharge summary for a list of discharge medications.  Relevant Imaging Results:  Relevant Lab Results:   Additional Information SSN: [id number]  Cranford Mon, [place]

## 2015-02-09 NOTE — Progress Notes (Signed)
VASCULAR SURGERY ADDENDUM:  He had some bleeding from the open foot wound on the right foot after the VAC was removed. I was able to cauterize this at the bedside. Dressing reapplied. Can re-apply VAC tomorrow.  Deitra Mayo, MD, [place] [phone] Office: [phone]

## 2015-02-09 NOTE — Care Management Note (Signed)
Case Management Note Marvetta Gibbons RN, BSN Unit 2W-Case Manager [phone]  Patient Details  Name: Marc [name] MRN: [id number] Date of Birth: 1940/09/21  Subjective/Objective:   Pt admitted s/p: Right Fem-TPT Bypass with vein and TPT endarterectomy and VPA.  - wound VAC in place                  Action/Plan: PTA pt lived at home- was active with [facility] for HH-RN/PT/OT/SW- will need resumption orders on d/c if plan is to return home- received order for home wound VAC- have placed [facility] order form on shadow chart for signature- will f/u once signed and start process. Awaiting PT/OT evals for recommendations,   Expected Discharge Date:                  Expected Discharge Plan:  [place]  In-House Referral:     Discharge planning Services  CM Consult  Post Acute Care Choice:  Durable Medical Equipment, Home Health, Resumption of Svcs/PTA Provider Choice offered to:  Patient  DME Arranged:  Vac DME Agency:  [place] Arranged:  RN, PT, OT, Social Work [employer] Agency:  [place]  Status of Service:  In process, will continue to follow  Medicare Important Message Given:    Date Medicare IM Given:    Medicare IM give by:    Date Additional Medicare IM Given:    Additional Medicare Important Message give by:     If discussed at [place] of Stay Meetings, dates discussed:    Additional Comments:  02/09/15- Marvetta Gibbons RN, BSN- [facility] wound VAC form signed- spoke with [place] with [facility] who came and picked up form- to follow for wound VAC needs- pt with bleeding today and VAC removed- per bedside RN plan is to try to re-apply [facility] tomorrow and still d/c with one- per PT/OT notes recommendations for CIR (pt's insurance likely will not cover) vs STSNF- CSW to f/u with pt to see if agreeable to STSNF vs home with [facility]. CM to f/u on d/c needs and plans tomorrow.   Dawayne Patricia, RN 02/09/2015, 10:51 AM

## 2015-02-09 NOTE — Progress Notes (Signed)
PT Cancellation Note  Patient Details Name: Marc [name] MRN: [id number] DOB: [birth year]   Cancelled Treatment:    Reason Eval/Treat Not Completed: Patient not medically ready Holding PT treatment today per RN and MD due to bleeding from RLE. Wound vac supposed to be replaced tomorrow so will follow up once cleared by MD.    Lacie Draft 02/09/2015, 1:47 PM  Wray Kearns, [place], DPT [phone]

## 2015-02-09 NOTE — Progress Notes (Addendum)
   VASCULAR SURGERY ASSESSMENT & PLAN:  * 2 Day Post-Op s/p: Right Fem-TPT Bypass with vein and TPT endarterectomy and VPA.  * Excellent peroneal signal with doppler which is his only runoff.  * VAC with good seal. Change today.   * Will need discharge planner to help arrange for VAC at home.  * Ambulated yesterday PWB right foot heel only with Darco shoe.  * Continue po Antibiotics for cellulitis (augmentin).   * DVT prophylaxis: Lovenox.  * Home on po ABx when VAC approved and ambulating safely.  SUBJECTIVE: No complaints.  PHYSICAL EXAM: Filed Vitals:   02/08/15 1107 02/08/15 1342 02/08/15 2041 02/09/15 0512  BP: 106/45 99/49 122/46 110/41  Pulse: 88 70 56 57  Temp: 98.2 F (36.8 C) 99 F (37.2 C) 98 F (36.7 C) 97.7 F (36.5 C)  TempSrc: Oral Oral Oral Oral  Resp: 16 16 17 18   Height:      Weight:      SpO2: 100% 99% 99% 95%   Brisk peroneal signal with doppler.  VAC with good seal.  LABS: Lab Results  Component Value Date   WBC 10.7* 02/07/2015   HGB 10.7* 02/07/2015   HCT 33.5* 02/07/2015   MCV 99.7 02/07/2015   PLT 280 02/07/2015   Lab Results  Component Value Date   CREATININE 1.85* 02/07/2015   Lab Results  Component Value Date   INR 1.12 02/07/2015   CBG (last 3)   Recent Labs  02/07/15 0616 02/07/15 1436 02/07/15 1842  GLUCAP 90 125* 156*    Active Problems:   Atherosclerosis of native arteries of extremities with gangrene, right leg [facility])  Gae Gallop Beeper: [id number] 02/09/2015

## 2015-02-09 NOTE — Evaluation (Signed)
Occupational Therapy Evaluation Patient Details Name: Marc [name] MRN: [id number] DOB: Jun 07, 1940 Today's Date: 02/09/2015    History of Present Illness Pt is a 75 y/o M s/p Rt fem-TBT bypass, TPT endarectomy, and Rt great toe amputation. Pt's PMH includes gout, bradycardia, ACDF, Rt TKA, anterior lat lumbar fusion.   Clinical Impression   Patient presenting with decreased ADL & functional mobility independence AND increased pain. Patient mod I PTA. Limited OT evaluation secondary to patient's increased bleeding from incision site. Pt required set-up/supervision for grooming tasks seated EOB and requires up to max assist for LB ADLs (sitting/lateral leans). Patient will benefit from acute OT to increase overall independence in the areas of ADLs, functional mobility, and overall safety in order to safely discharge to venue listed below. IF patient unable to discharge to CIR, recommending ST SNF prior to discharge back home.    Follow Up Recommendations  CIR;Supervision/Assistance - 24 hour    Equipment Recommendations  Other (comment) (TBD)    Recommendations for Other Services Rehab consult   Precautions / Restrictions Precautions Precautions: Fall Required Braces or Orthoses: Other Brace/Splint Other Brace/Splint: Rt darco shoe Restrictions Weight Bearing Restrictions: Yes RLE Weight Bearing: Partial weight bearing      Mobility Bed Mobility Overal bed mobility: Needs Assistance Bed Mobility: Supine to Sit;Sit to Supine     Supine to sit: Min guard Sit to supine: Min guard   General bed mobility comments: Cues for technique and management of LLE (mainly due to increased bleeding). Pt needed increased time.   Transfers General transfer comment: Did not occur during OT eval/treat secondary to increased bleeding from incision.    Balance Overall balance assessment: Needs assistance Sitting-balance support: No upper extremity supported;Feet supported Sitting  balance-Leahy Scale: Good Sitting balance - Comments: Pt able to complete grooming tasks of brushing teeth and combing/fixing hair at a distant supervision level    ADL Overall ADL's : Needs assistance/impaired Eating/Feeding: Set up;Bed level   Grooming: Set up;Supervision/safety;Sitting Grooming Details (indicate cue type and reason): EOB unsupported Upper Body Bathing: Min guard;Sitting   Lower Body Bathing: Maximal assistance;Sitting/lateral leans   Upper Body Dressing : Min guard;Sitting   Lower Body Dressing: Maximal assistance;Sitting/lateral leans     Toilet Transfer Details (indicate cue type and reason): did not occur   Toileting - Clothing Manipulation Details (indicate cue type and reason): did not occur   Tub/Shower Transfer Details (indicate cue type and reason): did not occur   General ADL Comments: No transfer occurred during this OT eval/treat secondary to increased bleeding coming from incision site. Pt also limited by increased pain. Wound nurse in/out throughout session.     Vision Vision Assessment?: No apparent visual deficits          Pertinent Vitals/Pain Pain Assessment: 0-10 Pain Score: 10-Worst pain ever ("14") Pain Location: RLE Pain Descriptors / Indicators: Operative site guarding;Grimacing;Moaning;Aching;Constant Pain Intervention(s): Limited activity within patient's tolerance;Monitored during session;Repositioned;Relaxation     Hand Dominance Right   Extremity/Trunk Assessment Upper Extremity Assessment Upper Extremity Assessment: Generalized weakness   Lower Extremity Assessment Lower Extremity Assessment: Defer to PT evaluation   Cervical / Trunk Assessment Cervical / Trunk Assessment: Kyphotic   Communication Communication Communication: No difficulties   Cognition Arousal/Alertness: Awake/alert Behavior During Therapy: WFL for tasks assessed/performed Overall Cognitive Status: Within Functional Limits for tasks assessed               Home Living Family/patient expects to be discharged to:: Private residence Living Arrangements: Alone  Available Help at Discharge: Friend(s);Available 24 hours/day (ex wife) Type of Home: Mobile home Home Access: Stairs to enter Entrance Stairs-Number of Steps: 6 Entrance Stairs-Rails: Left Home Layout: Multi-level Alternate Level Stairs-Number of Steps:  (see below)   Bathroom Shower/Tub: Tub/shower unit;Curtain   Bathroom Toilet: Standard     Home Equipment: Environmental consultant - 2 wheels;Cane - single point;Walker - 4 wheels;Shower seat   Additional Comments: 6 steps w/ Lt rail up onto deck, 2 steps up to mud room w/o rail, then 1 step up into home w/o steps.  3 steps down to get to wood stove w/ Rt rail going down.        Prior Functioning/Environment Level of Independence: Independent with assistive device(s)  Comments: cane at all times    OT Diagnosis: Generalized weakness;Acute pain   OT Problem List: Decreased strength;Decreased activity tolerance;Impaired balance (sitting and/or standing);Decreased coordination;Decreased safety awareness;Decreased knowledge of use of DME or AE;Decreased knowledge of precautions;Pain   OT Treatment/Interventions: Self-care/ADL training;Therapeutic exercise;Energy conservation;DME and/or AE instruction;Therapeutic activities;Patient/family education;Balance training    OT Goals(Current goals can be found in the care plan section) Acute Rehab OT Goals Patient Stated Goal: to get back to independence OT Goal Formulation: With patient Time For Goal Achievement: 02/23/15 Potential to Achieve Goals: Good ADL Goals Pt Will Perform Grooming: with supervision;standing Pt Will Perform Lower Body Bathing: with supervision;sit to/from stand;with adaptive equipment Pt Will Perform Lower Body Dressing: with supervision;with adaptive equipment;sit to/from stand Pt Will Transfer to Toilet: with supervision;ambulating;bedside commode Additional ADL  Goal #1: Pt will be supervision with functional mobility using LRAD  OT Frequency: Min 2X/week   Barriers to D/C: Inaccessible home environment;Decreased caregiver support  6 STE, unsure of reliability of patient's stated 24/7 at this time    End of Session Activity Tolerance: Other (comment);Patient limited by pain (and limited by increased bleeding from incision site) Patient left: in bed   Time: 0930-0959 OT Time Calculation (min): 29 min Charges:  OT General Charges $OT Visit: 1 Procedure OT Evaluation $OT Eval Moderate Complexity: 1 Procedure OT Treatments $Self Care/Home Management : 8-22 mins  Chrys Racer , MS, OTR/L, CLT Pager: [phone]  02/09/2015, 10:13 AM

## 2015-02-09 NOTE — Anesthesia Postprocedure Evaluation (Signed)
Anesthesia Post Note  Patient: Marc [name]  Procedure(s) Performed: Procedure(s) (LRB): BYPASS GRAFT FEMORAL-PERONEAL Trunk WITH VEIN graft  right leg. (Right) GREAT TOE RAY AMPUTATION RIGHT  (Right)  APPLICATION OF WOUND VAC right great toe amputation site. (Right) INTRA OPERATIVE ARTERIOGRAM (Right) ENDARTERECTOMY TIBIOPERONEAL (Right) Vein PATCH ANGIOPLASTY to tibioperoneal trunk (Right)  Patient location during evaluation: PACU Anesthesia Type: General Level of consciousness: awake Pain management: pain level controlled Vital Signs Assessment: post-procedure vital signs reviewed and stable Respiratory status: spontaneous breathing, nonlabored ventilation, respiratory function stable and patient connected to nasal cannula oxygen Cardiovascular status: blood pressure returned to baseline and stable Postop Assessment: no signs of nausea or vomiting Anesthetic complications: no                   Zenaida Deed

## 2015-02-09 NOTE — Progress Notes (Signed)
  [facility] Progress Note  Subjective  - POD #2  No complaints.  Objective Filed Vitals:   02/08/15 2041 02/09/15 0512  BP: 122/46 110/41  Pulse: 56 57  Temp: 98 F (36.7 C) 97.7 F (36.5 C)  Resp: 17 18    Intake/Output Summary (Last 24 hours) at 02/09/15 [id number] Last data filed at 02/09/15 0513  Gross per 24 hour  Intake    600 ml  Output   1360 ml  Net   -760 ml    Strong right peroneal doppler signal Right incisions healing well Right foot wound vac seal intact  Assessment/Planning: 75 y.o. male is s/p: right fem-TPT bypass with vein and TPT endarterectomy and VPA 2 Days Post-Op   Bypass is patent.  Incisions healing well. Case management consult for home [facility] WOC consult for [facility] dressing change today. PT recommending CIR, but patient prefers to go home. Will await PT and OT recommedations. Plan discharge home tomorrow.   Alvia Grove 02/09/2015 8:12 AM --  Laboratory CBC    Component Value Date/Time   WBC 10.7* 02/07/2015 0659   HGB 10.7* 02/07/2015 0659   HCT 33.5* 02/07/2015 0659   PLT 280 02/07/2015 0659    BMET    Component Value Date/Time   NA 144 02/07/2015 0659   K 5.2* 02/07/2015 0659   CL 108 02/07/2015 0659   CO2 28 02/07/2015 0659   GLUCOSE 91 02/07/2015 0659   BUN 41* 02/07/2015 0659   CREATININE 1.85* 02/07/2015 0659   CALCIUM 8.9 02/07/2015 0659   GFRNONAA 34* 02/07/2015 0659   GFRAA 39* 02/07/2015 0659    COAG Lab Results  Component Value Date   INR 1.12 02/07/2015   INR 1.03 07/26/2014   INR 1.05 01/12/2014   No results found for: PTT  Antibiotics Anti-infectives    Start     Dose/Rate Route Frequency Ordered Stop   02/09/15 1000  amoxicillin-clavulanate (AUGMENTIN) 875-125 MG per tablet 1 tablet     1 tablet Oral 2 times daily 02/08/15 1143     02/08/15 1000  amoxicillin-clavulanate (AUGMENTIN) 875-125 MG per tablet 1 tablet  Status:  Discontinued     1 tablet Oral 2 times daily 02/07/15 2022  02/08/15 1143   02/08/15 0100  cefUROXime (ZINACEF) 1.5 g in dextrose 5 % 50 mL IVPB     1.5 g 100 mL/hr over 30 Minutes Intravenous Every 12 hours 02/07/15 2022 02/08/15 1526   02/07/15 1400  cefUROXime (ZINACEF) 1.5 g in dextrose 5 % 50 mL IVPB  Status:  Discontinued     1.5 g 100 mL/hr over 30 Minutes Intravenous To Surgery 02/07/15 1349 02/07/15 2022   02/07/15 0700  cefUROXime (ZINACEF) 1.5 g in dextrose 5 % 50 mL IVPB     1.5 g 100 mL/hr over 30 Minutes Intravenous To ShortStay Surgical 02/06/15 1436 02/07/15 [place], PA-C [facility] Office: [phone] Pager: [phone] 02/09/2015 8:12 AM

## 2015-02-09 NOTE — Consult Note (Addendum)
WOC wound consult note Reason for Consult: Consult requested to change Vac dressing to right foot. Pt medicated for pain prior to procedure. Wound type: Full thickness post-op wound 5X8X.3cm, 100% red with sutured area located next to the open incision Drainage (amount, consistency, odor) Small amt bloody drainage; 50cc bloody drainage was in the cannister prior to dressing change. Dressing procedure/placement/frequency: Applied one piece black foam to 100cc cont suction.  Pt had large amt pain despite meds given earlier.  Suction reapplied and mod amt bloody drainage continued.  Called VVS PA to assess bloody output.  They requested Vac to be left on at this time.  Left to get another Vac cannister, and upon returning, Vac had increased amt bloody drainage and was pooling around the dressing.  Called PA and advised to remove Vac and apply pressure dressing until they are able to arrive and assess for further bleeding. Dr Scot Dock arrived in room to assess wound and bedside nurse present to give more pain meds. Please re-consult when Vac dressing is desired to be re-applied.  Thank-you,  Julien Girt MSN, [place], [place], [place], [place]

## 2015-02-09 NOTE — Clinical Social Work Note (Signed)
Clinical Social Work Assessment  Patient Details  Name: Marc [name] MRN: [id number] Date of Birth: [birth year]  Date of referral:  02/09/15               Reason for consult:  Facility Placement                Permission sought to share information with:  Chartered certified accountant granted to share information::  Yes, Verbal Permission Granted  Name::        Agency::  [facility]  Relationship::     Contact Information:     Housing/Transportation Living arrangements for the past 2 months:  [place] of Information:  Patient Patient Interpreter Needed:  None Criminal Activity/Legal Involvement Pertinent to Current Situation/Hospitalization:  No - Comment as needed Significant Relationships:  Spouse, Siblings Lives with:  Self Do you feel safe going back to the place where you live?  No Need for family participation in patient care:  No (Coment)  Care giving concerns:  Pt lives at home alone and is currently having issues with mobility due to foot wound/ probably need for wound vac   Facilities manager / plan:  CSW spoke with pt concerning possible need for SNF for rehab/ wound management  Employment status:  Retired Nurse, adult PT Recommendations:  [place] / Referral to community resources:  [place]  Patient/Family's Response to care:  Pt agreeable to rehab placement especially with need for wound vac and with set back from todays wound care where his foot began bleeding profusely and had to be cauterized- prefers to go to [facility] where he was recently for rehab  Patient/Family's Understanding of and Emotional Response to Diagnosis, Current Treatment, and Prognosis:  Very frustrated with lack of definite answers from medical team- he does not feel as if he knows what his discharge plan is  Emotional Assessment Appearance:  Appears stated  age Attitude/Demeanor/Rapport:    Affect (typically observed):  Accepting, Appropriate Orientation:  Oriented to Self, Oriented to Place, Oriented to  Time, Oriented to Situation Alcohol / Substance use:  Not Applicable Psych involvement (Current and /or in the community):  No (Comment)  Discharge Needs  Concerns to be addressed:  Care Coordination Readmission within the last 30 days:  Yes Current discharge risk:  Physical Impairment Barriers to Discharge:  Continued Medical Work up   Cranford Mon, LCSW 02/09/2015, 5:01 PM

## 2015-02-10 ENCOUNTER — Telehealth: Payer: Self-pay | Admitting: Vascular Surgery

## 2015-02-10 LAB — WOUND CULTURE

## 2015-02-10 LAB — BASIC METABOLIC PANEL
ANION GAP: 8 (ref 5–15)
BUN: 39 mg/dL — ABNORMAL HIGH (ref 6–20)
CALCIUM: 8.4 mg/dL — AB (ref 8.9–10.3)
CO2: 25 mmol/L (ref 22–32)
CREATININE: 1.91 mg/dL — AB (ref 0.61–1.24)
Chloride: 107 mmol/L (ref 101–111)
GFR calc Af Amer: 38 mL/min — ABNORMAL LOW (ref >60)
GFR calc non Af Amer: 33 mL/min — ABNORMAL LOW (ref >60)
GLUCOSE: 185 mg/dL — AB (ref 65–99)
Potassium: 5 mmol/L (ref 3.5–5.1)
Sodium: 140 mmol/L (ref 135–145)

## 2015-02-10 LAB — CBC
HCT: 19 % — ABNORMAL LOW (ref 39.0–52.0)
HEMATOCRIT: 28.8 % — AB (ref 39.0–52.0)
HEMOGLOBIN: 6.1 g/dL — AB (ref 13.0–17.0)
HEMOGLOBIN: 9.3 g/dL — AB (ref 13.0–17.0)
MCH: 31.4 pg (ref 26.0–34.0)
MCH: 31 pg (ref 26.0–34.0)
MCHC: 32.1 g/dL (ref 30.0–36.0)
MCHC: 32.3 g/dL (ref 30.0–36.0)
MCV: 97.9 fL (ref 78.0–100.0)
MCV: 96 fL (ref 78.0–100.0)
Platelets: 219 10*3/uL (ref 150–400)
Platelets: 241 10*3/uL (ref 150–400)
RBC: 1.94 MIL/uL — ABNORMAL LOW (ref 4.22–5.81)
RBC: 3 MIL/uL — ABNORMAL LOW (ref 4.22–5.81)
RDW: 14.3 % (ref 11.5–15.5)
RDW: 15.5 % (ref 11.5–15.5)
WBC: 10.2 10*3/uL (ref 4.0–10.5)
WBC: 11.5 10*3/uL — AB (ref 4.0–10.5)

## 2015-02-10 LAB — PREPARE RBC (CROSSMATCH)

## 2015-02-10 MED ORDER — CIPROFLOXACIN HCL 500 MG PO TABS
500.0000 mg | ORAL_TABLET | Freq: Two times a day (BID) | ORAL | Status: DC
  Administered 2015-02-10 – 2015-02-11 (×3): 500 mg via ORAL
  Filled 2015-02-10 (×3): qty 1

## 2015-02-10 MED ORDER — GABAPENTIN 600 MG PO TABS
600.0000 mg | ORAL_TABLET | Freq: Two times a day (BID) | ORAL | Status: DC
  Administered 2015-02-10 – 2015-02-11 (×2): 600 mg via ORAL
  Filled 2015-02-10 (×2): qty 1

## 2015-02-10 MED ORDER — SODIUM CHLORIDE 0.9 % IV SOLN: Freq: Once | INTRAVENOUS | Status: DC

## 2015-02-10 MED ORDER — CIPROFLOXACIN HCL 500 MG PO TABS: 500.0000 mg | ORAL_TABLET | Freq: Two times a day (BID) | ORAL | Status: DC

## 2015-02-10 MED ORDER — OXYCODONE-ACETAMINOPHEN 5-325 MG PO TABS: 1.0000 | ORAL_TABLET | ORAL | Status: DC | PRN

## 2015-02-10 NOTE — Progress Notes (Signed)
Physical Therapy Treatment Patient Details Name: Marc [name] MRN: [id number] DOB: January 17, 1941 Today's Date: 02/10/2015    History of Present Illness Pt is a 75 y/o M s/p Rt fem-TBT bypass, TPT endarectomy, and Rt great toe amputation. Pt's PMH includes gout, bradycardia, ACDF, Rt TKA, anterior lat lumbar fusion.    PT Comments    Marc [name] requires min guard>min assist for all mobility and is limited by Rt LE pain.  He will need ST SNF upon d/c to increase independence and safety.    Follow Up Recommendations  SNF;Supervision for mobility/OOB (ST SNF)     Equipment Recommendations  Other (comment) (TBD at next venue of care)    Recommendations for Other Services       Precautions / Restrictions Precautions Precautions: Fall Required Braces or Orthoses: Other Brace/Splint Other Brace/Splint: Rt darco shoe Restrictions Weight Bearing Restrictions: Yes RLE Weight Bearing: Partial weight bearing RLE Partial Weight Bearing Percentage or Pounds:  (not specified in order, followed 50% this session)    Mobility  Bed Mobility Overal bed mobility: Needs Assistance Bed Mobility: Supine to Sit;Sit to Supine     Supine to sit: Min guard Sit to supine: Min guard   General bed mobility comments: Cues for technique.  Increased time and pt relies heavily on bed rails to pull up to sitting.    Transfers Overall transfer level: Needs assistance Equipment used: Rolling walker (2 wheeled) Transfers: Sit to/from Stand Sit to Stand: Min assist         General transfer comment: Min assist to steady when standing.  Cues for backing up to [facility] and bed and reaching back and controlling descent.  Ambulation/Gait Ambulation/Gait assistance: Min guard Ambulation Distance (Feet): 10 Feet Assistive device: Rolling walker (2 wheeled) Gait Pattern/deviations: Decreased weight shift to right;Decreased stride length;Decreased stance time - right;Decreased step length - left;Step-to  pattern;Trunk flexed;Antalgic   Gait velocity interpretation: <1.8 ft/sec, indicative of risk for recurrent falls General Gait Details: Cues for PWB and utilizing Bil UE to WB through RW.  Flexed posture w/ cues to correct   Stairs            Wheelchair Mobility    Modified Rankin (Stroke Patients Only)       Balance Overall balance assessment: Needs assistance Sitting-balance support: Feet supported;No upper extremity supported Sitting balance-Leahy Scale: Fair     Standing balance support: Bilateral upper extremity supported;During functional activity Standing balance-Leahy Scale: Poor Standing balance comment: Relies on RW for support                    Cognition Arousal/Alertness: Awake/alert Behavior During Therapy: WFL for tasks assessed/performed Overall Cognitive Status: Within Functional Limits for tasks assessed                      Exercises General Exercises - Lower Extremity Ankle Circles/Pumps: AROM;Both;10 reps;Seated Long Arc Quad: AROM;Both;10 reps;Seated Hip Flexion/Marching: AROM;Both;10 reps;Seated    General Comments        Pertinent Vitals/Pain Pain Assessment: 0-10 Pain Score: 7  Pain Location: Rt groin, plantar surface of Rt foot Pain Descriptors / Indicators: Aching;Grimacing;Guarding Pain Intervention(s): Limited activity within patient's tolerance;Monitored during session;Repositioned    Home Living                      Prior Function            PT Goals (current goals can now be found in the  care plan section) Acute Rehab PT Goals Patient Stated Goal: go to rehab today PT Goal Formulation: With patient Time For Goal Achievement: 02/22/15 Potential to Achieve Goals: Good Progress towards PT goals: Progressing toward goals    Frequency  Min 3X/week    PT Plan Discharge plan needs to be updated    Co-evaluation             End of Session Equipment Utilized During Treatment: Gait  belt;Other (comment) (Rt darco shoe) Activity Tolerance: Patient limited by fatigue;Patient limited by pain Patient left: with call bell/phone within reach;in bed;with bed alarm set     Time: 1355-1420 PT Time Calculation (min) (ACUTE ONLY): 25 min  Charges:  $Gait Training: 8-22 mins $Therapeutic Exercise: 8-22 mins                    G Codes:      Marc [name] PT, [place] [id number] Pager: [phone] 02/10/2015, 3:02 PM

## 2015-02-10 NOTE — Telephone Encounter (Signed)
-----   Message from Mena Goes, RN sent at 02/10/2015  2:52 PM EST ----- Regarding: schedule   ----- Message -----    From: Alvia Grove, PA-C    Sent: 02/10/2015  11:10 AM      To: Vvs Charge Pool  S/p 1. Right common femoral artery to tibial peroneal trunk artery bypass with non-reverse translocated right great saphenous vein and ray amputation of right great toe and placement of VAC 02/07/15  F/u with [facility] in 2 weeks  Thanks  Maudie Mercury

## 2015-02-10 NOTE — Consult Note (Addendum)
WOC wound consult note Reason for Consult: Pt is followed by VVS team for plan of care. Consult requested to re-apply Vac dressing to right foot. Pt medicated for pain prior to procedure, removed after generous amt saline was applied to previous dressing. Wound type: Full thickness post-op wound 5X8X.3cm, pale red woundbed with black area near the edge where it was cauterized yesterday; refer to VVS notes. Sutured area well approximated and located next to the open incision Drainage (amount, consistency, odor) Scant amt yellow drainage, no further bleeding Dressing procedure/placement/frequency: Applied one piece black foam to 100cc cont suction. Pt had mod amt pain despite meds given earlier. Plan for bedside nurse to change Q M/W/F.  Applied ace wrap over site to protect from further injury. Please re-consult if further assistance is needed.  Thank-you,  Julien Girt MSN, [place], [place], [place], [place]

## 2015-02-10 NOTE — Progress Notes (Signed)
Pt refusing DC due to lack of cable access at the facility- he would rather go home with home health and have access to TV.  CSW informed PA- she will adjust orders  CSW informed night RNCM who is working on getting equipment  To rescheduled [facility] transport call [phone] ext [phone] then ext [phone]  CSW signing off  Domenica Reamer, [place] Worker [phone]

## 2015-02-10 NOTE — Care Management (Signed)
ED CM received message from [place] concerning change of discharge disposition. Patient is refusing to go to SNF in now requesting to go home. CM spoke with patient to discuss transitional plan home with [facility] services and wound vac, patient is amendable. Offered choice of [place] agency, patient selected [facility] service. CM placed call to [facility] regarding wound vac, awaiting return call.

## 2015-02-10 NOTE — Care Management Note (Addendum)
Case Management Note Marvetta Gibbons RN, BSN Unit 2W-Case Manager [phone]  Patient Details  Name: Marc [name] MRN: [id number] Date of Birth: [birth year]  Subjective/Objective:   Pt admitted s/p: Right Fem-TPT Bypass with vein and TPT endarterectomy and VPA.  - wound VAC in place                  Action/Plan: PTA pt lived at home- was active with [facility] for HH-RN/PT/OT/SW- will need resumption orders on d/c if plan is to return home- received order for home wound VAC- have placed [facility] order form on shadow chart for signature- will f/u once signed and start process. Awaiting PT/OT evals for recommendations,   Expected Discharge Date:    02/10/15              Expected Discharge Plan:  [place]  In-House Referral:  Clinical Social Work  Discharge planning Services  CM Consult  Post Acute Care Choice:  Durable Medical Equipment, Home Health, Resumption of Svcs/PTA Provider Choice offered to:  Patient  DME Arranged:  Vac DME Agency:  [place] Arranged:  RN, PT, OT, Social Work [employer] Agency:  [place]  Status of Service:  Completed, signed off  Medicare Important Message Given:  Yes Date Medicare IM Given:    Medicare IM give by:    Date Additional Medicare IM Given:    Additional Medicare Important Message give by:     If discussed at [place] of Stay Meetings, dates discussed:    Discharge Disposition: Skilled Facility   Additional Comments:  02/09/14- transport VAC at bedside- case and bag along with charger- will need to go with pt to SNF- bedside RN aware and to send with pt when transported.  Pt received blood transfusion and plan to d/c post transfusion.   02/10/15- Marvetta Gibbons RN, BSN - per PT/OT recommendation pt agreeable to STSNF for rehab with wound VAC- per CSW pt has chosen [facility]- and they contract with [employer] for wound VAC needs- pt will need transport VAC to d/c to SNF- call placed to [place] with [employer]-  she will bring transport  VAC to pt's room for discharge- have alerted pt's bedside RN of plan and CSW aware that transport VAC on it's way. Will let CSW know when pt ready for transport.  Have notified Jerm[place] with [facility] of change in d/c plans.   02/09/15- Marvetta Gibbons RN, BSN- [facility] wound VAC form signed- spoke with [place] with [facility] who came and picked up form- to follow for wound VAC needs- pt with bleeding today and VAC removed- per bedside RN plan is to try to re-apply [facility] tomorrow and still d/c with one- per PT/OT notes recommendations for CIR (pt's insurance likely will not cover) vs STSNF- CSW to f/u with pt to see if agreeable to STSNF vs home with [facility]. CM to f/u on d/c needs and plans tomorrow.   Dawayne Patricia, RN 02/10/2015, 12:08 PM

## 2015-02-10 NOTE — Telephone Encounter (Signed)
Spoke with pt, dpm °

## 2015-02-10 NOTE — Progress Notes (Signed)
Patient will discharge to [facility] Anticipated discharge date:02/09/14 Transportation by [facility]- called at 4:55pm  [place] signing off.  Domenica Reamer, [place] Social Worker [phone]

## 2015-02-10 NOTE — Care Management (Signed)
ED CM spoke with [employer] Rep Angelina Ok, she will be unable to get a home wound vac delivered until tomorrow morning. CM contacted Talmadge Chad RN on 2W with update, to notify physician with plan. CM need DME wound vac orders signed. In addition [employer] pad order on shadow chart will need to be signed by the Attending Physcian to process the referral.  Updated the patient he is amendable with plan.CM will follow up in the morning with HH and wound vac in the morning.

## 2015-02-10 NOTE — Progress Notes (Addendum)
Notified by case management that home wound vac cannot be delivered until tomorrow morning.  Pt updated.  On call MD Bridgett Larsson notified that patient would not be leaving tonight.  Will cont to monitor pt closely.  Claudette Stapler, RN

## 2015-02-10 NOTE — Progress Notes (Signed)
CRITICAL VALUE ALERT  Critical value received:  Hgb 6.1  Date of notification:  02/10/2015  Time of notification:  5:30  Critical value read back:yes  Nurse who received alert:  Mathews Robinsons  MD notified (1st page):  [place]  Time of first page:  5:30  MD notified (2nd page):  Time of second page:  Responding MD:  Trula Slade  Time MD responded:  5:33

## 2015-02-10 NOTE — Discharge Summary (Signed)
[facility] Discharge Summary  Marc [name] Feb 09, 1940 75 y.o. male  [id number]  Admission Date: 02/07/2015  Discharge Date: 02/10/2015  Physician: Angelia [name], MD  Admission Diagnosis: Peripheral Vascular Disease with nonhealing wound right foot  I70.234  HPI:   This is a 75 y.o. male who saw Dr. Scot Dock earlier this year for PAD. At that time, he had underwent a lower extremity vascular study which showed evidence of an occlusion of his right distal femoral artery.   He was noted to have claudication, and he continues to have this and it has not changed since previous visit. He does have a hx of bilateral iliac stenting by Dr. Gwenlyn Found in the past.   He states that the week before Christmas, he was having swelling in his right foot, which got better. This happened twice. Then it swelled again without relief. He thought this was gout. He did take anti-inflammatories without relief. He states that he got a crack in his foot and it has progressed to a larger wound. He states that he is diabetic. He is having pain and redness in his right foot. He subsequently got admitted to the hospital and had IV abx. He continues to be on a po antibiotic. Ortho did debride his foot while in the hospital.   He is on amaryl for his DM. He is on an ACEI for BP management. He is on a statin and baby aspirin.   Hospital Course:  The patient was admitted to the hospital and taken to the operating room on 02/07/2015 and underwent: 1. Right common femoral artery to tibial peroneal trunk artery bypass with non-reverse translocated right great saphenous vein 2. Intraoperative arteriogram 3. Endarterectomy of tibial peroneal trunk with vein patch angioplasty 4. 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   POD 1: The patient had an excellent peroneal  doppler signal, which was his only runoff. Incisions were healing well. His VAC seal was intact. He was transferred to the floor. He was kept on Augmentin for cellulitis. He was to ambulate partial weightbearing.   POD 2: His VAC dressing was changed today. Wound care noted moderate bleeding with the dressing change. Dr. Scot Dock used electrocautery to control the bleeding and a wet to dry dressing was placed.   POD 3: He had acute blood loss anemia with a hemoglobin of 6.1 which was a significant drop from pre-op. He did have some intraoperative blood loss and generalized oozing that is likely related to the drop. He had no oozing from his right foot amputation site and the negative pressure dressing was replaced.  He denied any melena. He was transfused 2 units of pRBCs. His follow-up hemoglobin was 9.3. His wound culture grew Serratia Marcescens that is sensitive to ciprofloxacin. His renal function was stable with creatinine of 1.9. The patient wanted to go to a skilled nursing facility temporarily after discharge. However, once the patient found out that there was no cable access at the facility, he decided he wanted to go home instead.  A VAC was arranged for discharge. He was discharged home with home health services on POD 3 in good condition.     CBC    Component Value Date/Time   WBC 11.5* 02/10/2015 1610   RBC 3.00* 02/10/2015 1610   HGB 9.3* 02/10/2015 1610   HCT 28.8* 02/10/2015 1610   PLT 241  02/10/2015 1610   MCV 96.0 02/10/2015 1610   MCH 31.0 02/10/2015 1610   MCHC 32.3 02/10/2015 1610   RDW 15.5 02/10/2015 1610   LYMPHSABS 1.7 01/17/2015 1620   MONOABS 0.5 01/17/2015 1620   EOSABS 0.2 01/17/2015 1620   BASOSABS 0.0 01/17/2015 1620    BMET    Component Value Date/Time   NA 140 02/10/2015 0352   K 5.0 02/10/2015 0352   CL 107 02/10/2015 0352   CO2 25 02/10/2015 0352   GLUCOSE 185* 02/10/2015 0352   BUN 39* 02/10/2015 0352   CREATININE 1.91* 02/10/2015 0352   CALCIUM  8.4* 02/10/2015 0352   GFRNONAA 33* 02/10/2015 0352   GFRAA 38* 02/10/2015 0352     Discharge Instructions:   The patient is discharged to home with extensive instructions on wound care and progressive ambulation.  They are instructed not to drive or perform any heavy lifting until returning to see the physician in his office.      Discharge Instructions    Call MD for:  redness, tenderness, or signs of infection (pain, swelling, bleeding, redness, odor or green/yellow discharge around incision site)    Complete by:  As directed      Call MD for:  severe or increased pain, loss or decreased feeling  in affected limb(s)    Complete by:  As directed      Call MD for:  temperature >100.5    Complete by:  As directed      Discharge wound care:    Complete by:  As directed   Wash leg incisions daily with soap and water and pat dry.   Negative pressure dressing: Continuous 100 mmHg. Dressing changes TTS     Driving Restrictions    Complete by:  As directed   No driving for 2 weeks     Increase activity slowly    Complete by:  As directed   Partial weightbearing right foot with Darco shoe.     Lifting restrictions    Complete by:  As directed   No lifting for 2 weeks     Resume previous diet    Complete by:  As directed            Discharge Diagnosis:  Peripheral Vascular Disease with nonhealing wound right foot  I70.234  Secondary Diagnosis: Patient Active Problem List   Diagnosis Date Noted  . Atherosclerosis of native arteries of extremities with gangrene, right leg ([place]) 02/07/2015  . Atherosclerosis of native arteries of the extremities with gangrene ([place]) 02/02/2015  . Extremity atherosclerosis with gangrene ([place]) 02/02/2015  . Cellulitis 01/16/2015  . Acute renal failure superimposed on stage 3 chronic kidney disease ([place]) 01/16/2015  . SIRS (systemic inflammatory response syndrome) ([place]) 01/16/2015  . Acute on chronic kidney failure ([place])   . Type 2 diabetes  mellitus with right diabetic foot infection ([place])   . CAD (coronary artery disease), native coronary artery   . Ascending aortic aneurysm ([place])   . Mass of lower lobe of left lung   . Chronic kidney disease stage 3   . Peripheral vascular disease with claudication ([place])   . Gout 02/07/2013  . Hyperlipidemia 02/07/2013  . GERD (gastroesophageal reflux disease) 02/07/2013  . Type 2 diabetes mellitus with peripheral neuropathy ([facility])   . Spinal stenosis of lumbar region 01/14/2013  . Tobacco abuse disorder 03/10/2012  . Bradycardia:  Normal condition for this patient. Previously worked up. 02/05/2012  . Hx of peptic ulcer 05/21/2010  .  Hx of adenomatous colonic polyps 05/21/2010  . Hypertensive heart disease   . Lumbar disc disease    Past Medical History  Diagnosis Date  . Hypertension   . Ulcer disease   . Colon polyp   . Gout   . Bradycardia   . Hypercholesteremia   . PAD (peripheral artery disease) ([place])   . Chronic bronchitis ([place])   . Type II diabetes mellitus ([place])   . History of GI bleed 2007  . Gout   . Tobacco abuse disorder 03/10/2012  . GERD (gastroesophageal reflux disease)   . Pneumonia     denies  . ARF (acute renal failure) ([place]) 12/2014  . Neuropathy ([place])   . Arthritis   . Anemia     low iron       Medication List    STOP taking these medications        amoxicillin-clavulanate 875-125 MG tablet  Commonly known as:  AUGMENTIN     collagenase ointment  Commonly known as:  SANTYL      TAKE these medications        allopurinol 300 MG tablet  Commonly known as:  ZYLOPRIM  Take 300 mg by mouth 2 (two) times daily.     aspirin EC 81 MG tablet  Take 81 mg by mouth at bedtime.     atorvastatin 10 MG tablet  Commonly known as:  LIPITOR  Take 10 mg by mouth at bedtime.     ciprofloxacin 500 MG tablet  Commonly known as:  CIPRO  Take 1 tablet (500 mg total) by mouth 2 (two) times daily.     clopidogrel 75 MG tablet  Commonly known as:  PLAVIX    Take 75 mg by mouth at bedtime.     colchicine 0.6 MG tablet  Take 1 tablet (0.6 mg total) by mouth daily.     furosemide 80 MG tablet  Commonly known as:  LASIX  Take 80 mg by mouth daily.     gabapentin 600 MG tablet  Commonly known as:  NEURONTIN  Take 1,200 mg by mouth 2 (two) times daily.     glimepiride 4 MG tablet  Commonly known as:  AMARYL  Take 4 mg by mouth 2 (two) times daily.     lisinopril 20 MG tablet  Commonly known as:  PRINIVIL,ZESTRIL  Take 1 tablet (20 mg total) by mouth daily.     oxyCODONE-acetaminophen 5-325 MG tablet  Commonly known as:  PERCOCET/ROXICET  Take 1 tablet by mouth every 4 (four) hours as needed for moderate pain or severe pain.     pantoprazole 40 MG tablet  Commonly known as:  PROTONIX  Take 1 tablet (40 mg total) by mouth daily.     potassium gluconate 595 MG Tabs tablet  Take 595 mg by mouth at bedtime as needed (leg cramps).     predniSONE 20 MG tablet  Commonly known as:  DELTASONE  Take 20 mg by mouth daily.        Percocet #30 No Refill  Disposition: SNF  Patient's condition: is Good  Follow up: 1. Dr. Scot Dock in 2 weeks   Virgina Jock, PA-C [facility] [phone] 02/10/2015  4:50 PM  - For VQI Registry use --- Instructions: Press F2 to tab through selections.  Delete question if not applicable.   Post-op:  Wound infection: No  Graft infection: No  Transfusion: Yes  If yes, 2 units given New Arrhythmia: No Ipsilateral amputation: No, [ ]   Minor, [ ]  BKA, [ ]  AKA Discharge patency: [x ] Primary, [ ]  Primary assisted, [ ]  Secondary, [ ]  Occluded Patency judged by: [x ] Dopper only, [ ]  Palpable graft pulse, [ ]  Palpable distal pulse, [ ]  ABI inc. > 0.15, [ ]  Duplex Discharge ABI: R 0.65, L 0.53 D/C Ambulatory Status: Ambulatory with Assistance  Complications: MI: No, [ ]  Troponin only, [ ]  EKG or Clinical CHF: No Resp failure:No, [ ]  Pneumonia, [ ]  Ventilator Chg in renal  function: No, [ ]  Inc. Cr > 0.5, [ ]  Temp. Dialysis, [ ]  Permanent dialysis Stroke: No, [ ]  Minor, [ ]  Major Return to OR: No  Reason for return to OR: [ ]  Bleeding, [ ]  Infection, [ ]  Thrombosis, [ ]  Revision  Discharge medications: Statin use:  yes ASA use:  yes Plavix use:  yes Beta blocker use: no Coumadin use: no

## 2015-02-10 NOTE — Care Management (Signed)
ED CM placed 2nd call to [facility]. Still awaiting a response.  CM attempted to update patient unable to reach patient by phone will try again. CM will continue to follow

## 2015-02-10 NOTE — Care Management Important Message (Signed)
Important Message  Patient Details  Name: Marc [name] MRN: [id number] Date of Birth: 08/24/1940   Medicare Important Message Given:  Yes    Ruth Tully Abena 02/10/2015, 11:08 AM

## 2015-02-10 NOTE — Progress Notes (Addendum)
   VASCULAR SURGERY ASSESSMENT & PLAN:  * 3 Day Post-Op s/p: Right Fem-TPT Bypass with vein and TPT endarterectomy and VPA.  * ACUTE BLOOD LOSS ANEMIA: His hemoglobin this morning is 6.1 which is a significant drop. He did have some intraoperative blood loss related to generalized oozing and this is likely related to this. He denies any melena. He is being transfused and I have ordered a follow up CBC for tomorrow. There is no evidence on exam of bleeding in the leg or from the wound.  * ID: Wound culture of amputated right great toe has grown Serratia Marcescens. This is resistant to Ancef and sensitive to Cipro.   * MILD RENAL INSUFFICIENCY: His renal function is stable. Creatinine is 1.9.  * The patient had some bleeding from the wound yesterday when the [facility] was changed. This was addressed with cautery at the bedside and a dressing was applied. Today, I would remove the dressing using sailing  And then reapply the VAC.  * Ambulate PWB right foot heel only with Darco shoe.  * Continue po Antibiotics for cellulitis (augmentin).   * DVT prophylaxis: Lovenox.  * he would like to go to a skilled nursing facility temporarily before going home until he is stronger. He can be discharged once the [facility] is approved. Will need to stay on po Cipro at the time of discharge.  SUBJECTIVE: No complaints.  PHYSICAL EXAM: Filed Vitals:   02/08/15 2041 02/09/15 0512 02/09/15 1753 02/09/15 2129  BP: 122/46 110/41 117/46 111/48  Pulse: 56 57 61 53  Temp: 98 F (36.7 C) 97.7 F (36.5 C) 98.3 F (36.8 C) 98.2 F (36.8 C)  TempSrc: Oral Oral Oral Oral  Resp: 17 18 17 18   Height:      Weight:      SpO2: 99% 95% 97% 99%   Right foot is warm and dressing is dry with no further bleeding. His groin incisions look fine.  LABS: Lab Results  Component Value Date   WBC 10.2 02/10/2015   HGB 6.1* 02/10/2015   HCT 19.0* 02/10/2015   MCV 97.9 02/10/2015   PLT 219 02/10/2015   Lab Results  Component  Value Date   CREATININE 1.91* 02/10/2015   Lab Results  Component Value Date   INR 1.12 02/07/2015   CBG (last 3)   Recent Labs  02/07/15 1436 02/07/15 1842  GLUCAP 125* 156*    Active Problems:   Atherosclerosis of native arteries of extremities with gangrene, right leg [facility])    Gae Gallop Beeper: [id number] 02/10/2015

## 2015-02-10 NOTE — Progress Notes (Signed)
Pt discharge education and instructions completed. Report called off Olivia Mackie a nurse at [facility]. Pt discharge to SNF with [facility]AR to transport him to disposition. Pt IV and telemetry removed. Pt bathed and changed into disposable gown. Pt wound vac dsg remain clean dry and intact with not active bleeding noted. Will continue to closely monitor. Francis Gaines Allena Pietila RN.

## 2015-02-11 LAB — CBC
HCT: 26.1 % — ABNORMAL LOW (ref 39.0–52.0)
HEMOGLOBIN: 8.6 g/dL — AB (ref 13.0–17.0)
MCH: 31.5 pg (ref 26.0–34.0)
MCHC: 33 g/dL (ref 30.0–36.0)
MCV: 95.6 fL (ref 78.0–100.0)
Platelets: 227 10*3/uL (ref 150–400)
RBC: 2.73 MIL/uL — ABNORMAL LOW (ref 4.22–5.81)
RDW: 15.9 % — ABNORMAL HIGH (ref 11.5–15.5)
WBC: 9.7 10*3/uL (ref 4.0–10.5)

## 2015-02-11 LAB — TYPE AND SCREEN
ABO/RH(D): O NEG
Antibody Screen: NEGATIVE
UNIT DIVISION: 0
UNIT DIVISION: 0

## 2015-02-11 LAB — BASIC METABOLIC PANEL
Anion gap: 7 (ref 5–15)
BUN: 44 mg/dL — AB (ref 6–20)
CHLORIDE: 108 mmol/L (ref 101–111)
CO2: 27 mmol/L (ref 22–32)
CREATININE: 1.72 mg/dL — AB (ref 0.61–1.24)
Calcium: 8.8 mg/dL — ABNORMAL LOW (ref 8.9–10.3)
GFR calc Af Amer: 43 mL/min — ABNORMAL LOW (ref >60)
GFR calc non Af Amer: 37 mL/min — ABNORMAL LOW (ref >60)
GLUCOSE: 169 mg/dL — AB (ref 65–99)
POTASSIUM: 4.8 mmol/L (ref 3.5–5.1)
SODIUM: 142 mmol/L (ref 135–145)

## 2015-02-11 MED ORDER — CIPROFLOXACIN HCL 500 MG PO TABS: 500.0000 mg | ORAL_TABLET | Freq: Two times a day (BID) | ORAL | Status: DC

## 2015-02-11 NOTE — Progress Notes (Addendum)
       Filed Vitals:   02/10/15 1211 02/10/15 1430 02/10/15 2003 02/11/15 0608  BP: 142/68 148/64 155/64 147/67  Pulse: 72 60 75 50  Temp: 97.8 F (36.6 C) 98.3 F (36.8 C) 97.6 F (36.4 C) 97.9 F (36.6 C)  TempSrc: Axillary Oral Oral Oral  Resp: 18 18 20 18   Height:      Weight:      SpO2: 97% 97% 97% 99%    Right foot wound vac intact Leg and foot warm to touch well perfused Incisions clean and dry Dry 4 x 4 place in right groin    S/P POD#4 Right Fem-TPT Bypass with vein and TPT endarterectomy and VPA.  Discharge to SNF and wound vac  Pending  Addendum  I have independently interviewed and examined the patient, and I agree with the physician assistant's findings.  Pt's D/C held due to [facility] arrangement delay.  Ok to D/C today  Adele Barthel, MD [facility] [place] Office: [phone] Pager: [phone]  02/11/2015, 9:26 AM

## 2015-02-11 NOTE — Progress Notes (Signed)
Patient D/C'd to home with brother.  D/C instructions reviewed with patient and brother.  Prescriptions for Cipro and Oxycodone handed to brother because patient would take them when I handed them to him. No IV present and telemetry D/C'd.

## 2015-02-13 NOTE — Progress Notes (Signed)
Post discharge- noted that pt had refused to go to SNF on 02/10/15- Pt had been arranged with [facility] for home wound VAC- and was previously active with [facility]- confirmed with Jermaine at [facility] - pt discharged home with [facility] wound VAC and [place] services. Rickie with [facility] came and picked up transport wound VAC on 02/13/15 and stated that pt was not on a [facility] home VAC.

## 2015-02-14 ENCOUNTER — Encounter (HOSPITAL_BASED_OUTPATIENT_CLINIC_OR_DEPARTMENT_OTHER): Payer: Medicare Other | Attending: Internal Medicine

## 2015-02-14 ENCOUNTER — Encounter: Payer: Self-pay | Admitting: Vascular Surgery

## 2015-02-14 DIAGNOSIS — E11621 Type 2 diabetes mellitus with foot ulcer: Secondary | ICD-10-CM | POA: Insufficient documentation

## 2015-02-14 DIAGNOSIS — I2510 Atherosclerotic heart disease of native coronary artery without angina pectoris: Secondary | ICD-10-CM | POA: Diagnosis not present

## 2015-02-14 DIAGNOSIS — E1140 Type 2 diabetes mellitus with diabetic neuropathy, unspecified: Secondary | ICD-10-CM | POA: Diagnosis not present

## 2015-02-14 DIAGNOSIS — L89610 Pressure ulcer of right heel, unstageable: Secondary | ICD-10-CM | POA: Insufficient documentation

## 2015-02-14 DIAGNOSIS — M109 Gout, unspecified: Secondary | ICD-10-CM | POA: Diagnosis not present

## 2015-02-14 DIAGNOSIS — T8189XA Other complications of procedures, not elsewhere classified, initial encounter: Secondary | ICD-10-CM | POA: Insufficient documentation

## 2015-02-14 DIAGNOSIS — I10 Essential (primary) hypertension: Secondary | ICD-10-CM | POA: Insufficient documentation

## 2015-02-14 DIAGNOSIS — L97512 Non-pressure chronic ulcer of other part of right foot with fat layer exposed: Secondary | ICD-10-CM | POA: Insufficient documentation

## 2015-02-14 DIAGNOSIS — Z89421 Acquired absence of other right toe(s): Secondary | ICD-10-CM | POA: Diagnosis not present

## 2015-02-14 DIAGNOSIS — F17210 Nicotine dependence, cigarettes, uncomplicated: Secondary | ICD-10-CM | POA: Diagnosis not present

## 2015-02-14 DIAGNOSIS — L89322 Pressure ulcer of left buttock, stage 2: Secondary | ICD-10-CM | POA: Diagnosis not present

## 2015-02-14 DIAGNOSIS — Y835 Amputation of limb(s) as the cause of abnormal reaction of the patient, or of later complication, without mention of misadventure at the time of the procedure: Secondary | ICD-10-CM | POA: Diagnosis not present

## 2015-02-14 DIAGNOSIS — E1151 Type 2 diabetes mellitus with diabetic peripheral angiopathy without gangrene: Secondary | ICD-10-CM | POA: Insufficient documentation

## 2015-02-14 DIAGNOSIS — L89612 Pressure ulcer of right heel, stage 2: Secondary | ICD-10-CM | POA: Diagnosis not present

## 2015-02-14 LAB — ANAEROBIC CULTURE

## 2015-02-21 DIAGNOSIS — E11621 Type 2 diabetes mellitus with foot ulcer: Secondary | ICD-10-CM | POA: Diagnosis not present

## 2015-02-22 ENCOUNTER — Ambulatory Visit (INDEPENDENT_AMBULATORY_CARE_PROVIDER_SITE_OTHER): Payer: Medicare Other | Admitting: Vascular Surgery

## 2015-02-22 ENCOUNTER — Encounter: Payer: Self-pay | Admitting: Vascular Surgery

## 2015-02-22 ENCOUNTER — Ambulatory Visit (HOSPITAL_COMMUNITY)
Admission: RE | Admit: 2015-02-22 | Discharge: 2015-02-22 | Disposition: A | Discharge status: Home / Self Care | Payer: Medicare Other | Source: Ambulatory Visit | Attending: Vascular Surgery | Admitting: Vascular Surgery

## 2015-02-22 VITALS — BP 128/71 | HR 71 | Temp 98.6°F | Ht 73.0 in | Wt 194.5 lb

## 2015-02-22 DIAGNOSIS — M7989 Other specified soft tissue disorders: Secondary | ICD-10-CM | POA: Insufficient documentation

## 2015-02-22 DIAGNOSIS — M79604 Pain in right leg: Secondary | ICD-10-CM | POA: Diagnosis not present

## 2015-02-22 DIAGNOSIS — I6523 Occlusion and stenosis of bilateral carotid arteries: Secondary | ICD-10-CM

## 2015-02-22 MED ORDER — OXYCODONE-ACETAMINOPHEN 5-325 MG PO TABS: 1.0000 | ORAL_TABLET | ORAL | Status: DC | PRN

## 2015-02-22 NOTE — Addendum Note (Signed)
Addended by: Mena Goes on: 02/22/2015 04:10 PM   Modules accepted: Orders

## 2015-02-22 NOTE — Progress Notes (Signed)
Patient name: Marc [name] MRN: [id number] DOB: Nov 05, 1940 Sex: male  REASON FOR VISIT: Follow up after right femoral to tibial peroneal trunk bypass with a vein graft and tibial peroneal trunk endarterectomy with vein patch angioplasty.  HPI: Marc [name] is a 75 y.o. male who had presented with an extensive wound is of his right great toe involving the metatarsal head. His arteriogram showed severe infrainguinal arterial occlusive disease. He underwent a right femoral to tibial peroneal trunk bypass with a vein graft and endarterectomy of the tibial peroneal trunk with vein patch angioplasty. In addition he had ray amputation of the right great toe and placement of a VAC. Comes in for an outpatient visit.  Today he has no specific complaints except significant right lower extremity swelling. He does continue to smoke but is trying to cut back. He does not describe any significant rest pain in his right foot. His activity is limited. Apparently his negative pressure dressing came off yesterday and this was left off.   Current Outpatient Prescriptions  Medication Sig Dispense Refill  . allopurinol (ZYLOPRIM) 300 MG tablet Take 300 mg by mouth 2 (two) times daily.     [name] amoxicillin-clavulanate (AUGMENTIN) 875-125 MG tablet Take 1 tablet by mouth 2 (two) times daily. 30 day course started 02/01/15    . aspirin EC 81 MG tablet Take 81 mg by mouth at bedtime.     [name] atorvastatin (LIPITOR) 10 MG tablet Take 10 mg by mouth at bedtime.     . ciprofloxacin (CIPRO) 500 MG tablet Take 1 tablet (500 mg total) by mouth 2 (two) times daily. 28 tablet 0  . clopidogrel (PLAVIX) 75 MG tablet Take 75 mg by mouth at bedtime.     . colchicine 0.6 MG tablet Take 1 tablet (0.6 mg total) by mouth daily. (Patient taking differently: Take 0.6 mg by mouth at bedtime. ) 10 tablet 0  . furosemide (LASIX) 80 MG tablet Take 80 mg by mouth daily.     [name] gabapentin (NEURONTIN) 600 MG tablet Take 1,200 mg by mouth 2  (two) times daily.     [name] glimepiride (AMARYL) 4 MG tablet Take 4 mg by mouth 2 (two) times daily.     [name] lisinopril (PRINIVIL,ZESTRIL) 20 MG tablet Take 1 tablet (20 mg total) by mouth daily. (Patient taking differently: Take 20 mg by mouth at bedtime. ) 30 tablet 0  . oxyCODONE-acetaminophen (PERCOCET/ROXICET) 5-325 MG tablet Take 1 tablet by mouth every 4 (four) hours as needed for moderate pain or severe pain. 30 tablet 0  . pantoprazole (PROTONIX) 40 MG tablet Take 1 tablet (40 mg total) by mouth daily. 30 tablet 0  . potassium gluconate 595 MG TABS tablet Take 595 mg by mouth at bedtime as needed (leg cramps).    . predniSONE (DELTASONE) 20 MG tablet Take 20 mg by mouth daily.     No current facility-administered medications for this visit.    REVIEW OF SYSTEMS:  [X]  denotes positive finding, [ ]  denotes negative finding Cardiac  Comments:  Chest pain or chest pressure:    Shortness of breath upon exertion:    Short of breath when lying flat:    Irregular heart rhythm:    Constitutional    Fever or chills:      PHYSICAL EXAM: Filed Vitals:   02/22/15 0947  BP: 128/71  Pulse: 71  Temp: 98.6 F (37 C)  TempSrc: Oral  Height: 6\' 1"  (1.854 m)  Weight: 194 lb  8 oz (88.225 kg)  SpO2: 99%    GENERAL: The patient is a well-nourished male, in no acute distress. The vital signs are documented above. CARDIOVASCULAR: There is a regular rate and rhythm. PULMONARY: There is good air exchange bilaterally without wheezing or rales. His right lower extremity is swollen up to the groin. 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 There are no signs of infection and the incisions look okay. The wound on his foot is open. He has a brisk dorsalis pedis, posterior tibial and anterior tibial signal with the Doppler. His graft is patent.  VENOUS DUPLEX: venous duplex does not show any evidence of right lower extremity DVT.  MEDICAL ISSUES:  STATUS POST RIGHT LOWER EXTREMITY BYPASS: he has  excellent Doppler signals in the right foot despite the swelling. I think his graft is patent. I think the only chance for this wound to heal be to continue the VAC. He has significant right lower extremity swelling and for this reason I obtained a venous duplex scan. This was negative. Discussed the importance of intermittent leg elevation and proper positioning for this.  We removed his sutures in the office today and he needs to continue his negative pressure dressing. I plan on seeing him back in 1 month. He knows to call sooner if he has problems. I have given him a prescription for pain medicine as is continuing to have incisional pain.  RIGHT LOWER EXTREMITY SWELLING: Venous duplex did not show any evidence of DVT.   Deitra Mayo [facility] [place]: [phone]

## 2015-02-28 ENCOUNTER — Encounter (HOSPITAL_COMMUNITY): Payer: Self-pay | Admitting: Emergency Medicine

## 2015-02-28 ENCOUNTER — Inpatient Hospital Stay (HOSPITAL_COMMUNITY)
Admission: EM | Admit: 2015-02-28 | Discharge: 2015-03-03 | DRG: 315 | Disposition: A | Discharge status: Home Health | Payer: Medicare Other | Attending: Internal Medicine | Admitting: Internal Medicine

## 2015-02-28 DIAGNOSIS — E860 Dehydration: Secondary | ICD-10-CM | POA: Diagnosis present

## 2015-02-28 DIAGNOSIS — Z96651 Presence of right artificial knee joint: Secondary | ICD-10-CM | POA: Diagnosis present

## 2015-02-28 DIAGNOSIS — N179 Acute kidney failure, unspecified: Secondary | ICD-10-CM | POA: Diagnosis present

## 2015-02-28 DIAGNOSIS — Z7952 Long term (current) use of systemic steroids: Secondary | ICD-10-CM

## 2015-02-28 DIAGNOSIS — Z833 Family history of diabetes mellitus: Secondary | ICD-10-CM

## 2015-02-28 DIAGNOSIS — F17210 Nicotine dependence, cigarettes, uncomplicated: Secondary | ICD-10-CM | POA: Diagnosis present

## 2015-02-28 DIAGNOSIS — E7800 Pure hypercholesterolemia, unspecified: Secondary | ICD-10-CM | POA: Diagnosis present

## 2015-02-28 DIAGNOSIS — Z79891 Long term (current) use of opiate analgesic: Secondary | ICD-10-CM

## 2015-02-28 DIAGNOSIS — T501X5A Adverse effect of loop [high-ceiling] diuretics, initial encounter: Secondary | ICD-10-CM | POA: Diagnosis present

## 2015-02-28 DIAGNOSIS — I739 Peripheral vascular disease, unspecified: Secondary | ICD-10-CM | POA: Diagnosis present

## 2015-02-28 DIAGNOSIS — D649 Anemia, unspecified: Secondary | ICD-10-CM | POA: Diagnosis present

## 2015-02-28 DIAGNOSIS — N189 Chronic kidney disease, unspecified: Secondary | ICD-10-CM

## 2015-02-28 DIAGNOSIS — I129 Hypertensive chronic kidney disease with stage 1 through stage 4 chronic kidney disease, or unspecified chronic kidney disease: Secondary | ICD-10-CM | POA: Diagnosis present

## 2015-02-28 DIAGNOSIS — E11621 Type 2 diabetes mellitus with foot ulcer: Secondary | ICD-10-CM | POA: Diagnosis not present

## 2015-02-28 DIAGNOSIS — Z7982 Long term (current) use of aspirin: Secondary | ICD-10-CM

## 2015-02-28 DIAGNOSIS — N183 Chronic kidney disease, stage 3 (moderate): Secondary | ICD-10-CM | POA: Diagnosis present

## 2015-02-28 DIAGNOSIS — I9589 Other hypotension: Secondary | ICD-10-CM

## 2015-02-28 DIAGNOSIS — Z66 Do not resuscitate: Secondary | ICD-10-CM | POA: Diagnosis present

## 2015-02-28 DIAGNOSIS — Z8249 Family history of ischemic heart disease and other diseases of the circulatory system: Secondary | ICD-10-CM

## 2015-02-28 DIAGNOSIS — S91301A Unspecified open wound, right foot, initial encounter: Secondary | ICD-10-CM

## 2015-02-28 DIAGNOSIS — E1122 Type 2 diabetes mellitus with diabetic chronic kidney disease: Secondary | ICD-10-CM | POA: Diagnosis present

## 2015-02-28 DIAGNOSIS — Z800 Family history of malignant neoplasm of digestive organs: Secondary | ICD-10-CM

## 2015-02-28 DIAGNOSIS — R42 Dizziness and giddiness: Secondary | ICD-10-CM | POA: Diagnosis not present

## 2015-02-28 DIAGNOSIS — Z720 Tobacco use: Secondary | ICD-10-CM | POA: Diagnosis present

## 2015-02-28 DIAGNOSIS — Z7902 Long term (current) use of antithrombotics/antiplatelets: Secondary | ICD-10-CM

## 2015-02-28 DIAGNOSIS — K219 Gastro-esophageal reflux disease without esophagitis: Secondary | ICD-10-CM | POA: Diagnosis present

## 2015-02-28 DIAGNOSIS — M109 Gout, unspecified: Secondary | ICD-10-CM | POA: Diagnosis present

## 2015-02-28 DIAGNOSIS — I959 Hypotension, unspecified: Secondary | ICD-10-CM | POA: Diagnosis not present

## 2015-02-28 DIAGNOSIS — E1129 Type 2 diabetes mellitus with other diabetic kidney complication: Secondary | ICD-10-CM | POA: Diagnosis present

## 2015-02-28 LAB — CBC WITH DIFFERENTIAL/PLATELET
BASOS PCT: 0 %
Basophils Absolute: 0 10*3/uL (ref 0.0–0.1)
EOS ABS: 0.2 10*3/uL (ref 0.0–0.7)
EOS PCT: 2 %
HCT: 31 % — ABNORMAL LOW (ref 39.0–52.0)
Hemoglobin: 9.6 g/dL — ABNORMAL LOW (ref 13.0–17.0)
Lymphocytes Relative: 26 %
Lymphs Abs: 2.3 10*3/uL (ref 0.7–4.0)
MCH: 31.2 pg (ref 26.0–34.0)
MCHC: 31 g/dL (ref 30.0–36.0)
MCV: 100.6 fL — ABNORMAL HIGH (ref 78.0–100.0)
MONO ABS: 0.5 10*3/uL (ref 0.1–1.0)
MONOS PCT: 5 %
NEUTROS PCT: 67 %
Neutro Abs: 5.9 10*3/uL (ref 1.7–7.7)
PLATELETS: 237 10*3/uL (ref 150–400)
RBC: 3.08 MIL/uL — ABNORMAL LOW (ref 4.22–5.81)
RDW: 17.2 % — AB (ref 11.5–15.5)
WBC: 8.8 10*3/uL (ref 4.0–10.5)

## 2015-02-28 LAB — GLUCOSE, CAPILLARY: GLUCOSE-CAPILLARY: 194 mg/dL — AB (ref 65–99)

## 2015-02-28 LAB — COMPREHENSIVE METABOLIC PANEL
ALBUMIN: 3 g/dL — AB (ref 3.5–5.0)
ALT: 7 U/L — ABNORMAL LOW (ref 17–63)
ANION GAP: 12 (ref 5–15)
AST: 13 U/L — ABNORMAL LOW (ref 15–41)
Alkaline Phosphatase: 72 U/L (ref 38–126)
BUN: 85 mg/dL — ABNORMAL HIGH (ref 6–20)
CO2: 26 mmol/L (ref 22–32)
Calcium: 8.9 mg/dL (ref 8.9–10.3)
Chloride: 105 mmol/L (ref 101–111)
Creatinine, Ser: 3.46 mg/dL — ABNORMAL HIGH (ref 0.61–1.24)
GFR calc non Af Amer: 16 mL/min — ABNORMAL LOW (ref >60)
GFR, EST AFRICAN AMERICAN: 18 mL/min — AB (ref >60)
GLUCOSE: 217 mg/dL — AB (ref 65–99)
POTASSIUM: 5 mmol/L (ref 3.5–5.1)
SODIUM: 143 mmol/L (ref 135–145)
Total Bilirubin: 0.3 mg/dL (ref 0.3–1.2)
Total Protein: 6.1 g/dL — ABNORMAL LOW (ref 6.5–8.1)

## 2015-02-28 MED ORDER — OXYCODONE-ACETAMINOPHEN 5-325 MG PO TABS
1.0000 | ORAL_TABLET | Freq: Once | ORAL | Status: AC
  Administered 2015-02-28: 1 via ORAL

## 2015-02-28 MED ORDER — OXYCODONE-ACETAMINOPHEN 5-325 MG PO TABS
ORAL_TABLET | ORAL | Status: AC
  Filled 2015-02-28: qty 1

## 2015-02-28 NOTE — ED Notes (Signed)
Pt. reports bleeding at right great toe amputation site this evening , surgery done 02/07/15 ,  advised by home health nurse to go to ER for management , denies injury .

## 2015-02-28 NOTE — ED Provider Notes (Signed)
CSN: [id number]     Arrival date & time 02/28/15  2114 History  By signing my name below, I, Marc [name], attest that this documentation has been prepared under the direction and in the presence of [name], MD. Electronically Signed: Altamease [name], ED Scribe. 03/01/2015. 3:08 AM   Chief Complaint  Patient presents with  . Post-op Problem   The history is provided by the patient. No language interpreter was used.   Marc [name] is a 75 y.o. male with history of type II DM, peripheral artery disease, HTN, and hypercholesteremia who presents to the Emergency Department complaining of bleeding at his right great toe amputation site with onset this evening after he was seen at the [facility]. Pt has the foot in a sandwich bag and notes that he has had to change the bag once because it was full. He called his home health nurse who was unable to stop the bleeding and advised him to come to the ED.  Associated symptoms include intermittent light headedness. Pt denies recent fever or SOB. The surgery was performed on 02/06/14 by Dr. Scot Dock. He is on Plavix. No history of CHF.   Past Medical History  Diagnosis Date  . Hypertension   . Ulcer disease   . Colon polyp   . Gout   . Bradycardia   . Hypercholesteremia   . PAD (peripheral artery disease) ([place])   . Chronic bronchitis ([place])   . Type II diabetes mellitus ([name])   . History of GI bleed 2007  . Gout   . Tobacco abuse disorder 03/10/2012  . GERD (gastroesophageal reflux disease)   . Pneumonia     denies  . ARF (acute renal failure) ([place]) 12/2014  . Neuropathy ([name])   . Arthritis   . Anemia     low iron   Past Surgical History  Procedure Laterality Date  . Splenectomy  ~ 1957  . Anterior cervical decomp/discectomy fusion  ~ 2007  . Upper endoscopy w/ sclerotherapy  ~ 2007  . Polypectomy    . Colonoscopy    . Iliac artery stent Left     left common/notes (02/04/2012)  . Hernia repair  ~ 2007    UHR  (02/04/2012)  . Tonsillectomy  ~ 1947  . Lumbar laminectomy  11/14  . Total knee arthroplasty Right 02/03/2013    Procedure: TOTAL KNEE ARTHROPLASTY;  Surgeon: [name], MD;  Location: [place];  Service: Orthopedics;  Laterality: Right;  . Lower extremity angiogram N/A 02/04/2012    Procedure: LOWER EXTREMITY ANGIOGRAM;  Surgeon: Lorretta Harp, MD;  Location: [facility] CATH LAB;  Service: Cardiovascular;  Laterality: N/A;  . Percutaneous stent intervention Left 02/04/2012    Procedure: PERCUTANEOUS STENT INTERVENTION;  Surgeon: Lorretta Harp, MD;  Location: [facility] CATH LAB;  Service: Cardiovascular;  Laterality: Left;  lt ext iliac stent  . Replacement total knee Right   . Back surgery    . Anterior lat lumbar fusion Left 01/19/2014    Procedure: LATERAL INTERBODY FUSION 1 LEVEL;  Surgeon: Sinclair Ship, MD;  Location: [place];  Service: Orthopedics;  Laterality: Left;  Left lumbar 3-4 lateral interbody fusion with instrumentation, allograft  . Peripheral vascular catheterization N/A 02/02/2015    Procedure: Abdominal Aortogram w/Lower Extremity;  Surgeon: Angelia [name], MD;  Location: [place] CV LAB;  Service: Cardiovascular;  Laterality: N/A;  . Eye surgery Bilateral     cataract surgery with lens implant  . Bypass  graft femoral-peroneal Right 02/07/2015    Procedure: BYPASS GRAFT FEMORAL-PERONEAL Trunk WITH VEIN graft  right leg.;  Surgeon: Angelia [name], MD;  Location: [place];  Service: Vascular;  Laterality: Right;  . Amputation Right 02/07/2015    Procedure: GREAT TOE RAY AMPUTATION RIGHT ;  Surgeon: Angelia [name], MD;  Location: [place];  Service: Vascular;  Laterality: Right;  . Application of wound vac Right 02/07/2015    Procedure:  APPLICATION OF WOUND VAC right great toe amputation site.;  Surgeon: Angelia [name], MD;  Location: [place];  Service: Vascular;  Laterality: Right;  . Intraoperative arteriogram Right 02/07/2015    Procedure: INTRA OPERATIVE  ARTERIOGRAM;  Surgeon: Angelia [name], MD;  Location: [place];  Service: Vascular;  Laterality: Right;  . Endarterectomy tibioperoneal Right 02/07/2015    Procedure: ENDARTERECTOMY TIBIOPERONEAL;  Surgeon: Angelia [name], MD;  Location: [facility] OR;  Service: Vascular;  Laterality: Right;  . Patch angioplasty Right 02/07/2015    Procedure: Vein PATCH ANGIOPLASTY to tibioperoneal trunk;  Surgeon: Angelia [name], MD;  Location[facility] OR;  Service: Vascular;  Laterality: Right;   Family History  Problem Relation Age of Onset  . Colon cancer Paternal Uncle     Uncle  . Heart disease Father   . Hypertension Father   . Heart attack Father   . Heart disease Mother   . Diabetes Mother   . Hypertension Mother    Social History  Substance Use Topics  . Smoking status: Current Every Day Smoker -- 1.50 packs/day for 58 years    Types: Cigarettes  . Smokeless tobacco: Former Systems developer    Types: Chew     Comment: Pt states he's going to use the cigarettes he has today (02/07/15) and will quit  . Alcohol Use: Yes     Comment: \    Review of Systems  Constitutional: Negative for fever.  Respiratory: Negative for shortness of breath.   Skin: Positive for wound (right foot).  Neurological: Positive for light-headedness.  All other systems reviewed and are negative.     Allergies  Review of patient's allergies indicates no known allergies.  Home Medications   Prior to Admission medications   Medication Sig Start Date End Date Taking? Authorizing Provider  allopurinol (ZYLOPRIM) 300 MG tablet Take 300 mg by mouth 2 (two) times daily.    Yes Historical Provider, MD  aspirin EC 81 MG tablet Take 81 mg by mouth at bedtime.    Yes Historical Provider, MD  atorvastatin (LIPITOR) 10 MG tablet Take 10 mg by mouth at bedtime.  11/23/14  Yes Historical Provider, MD  ciprofloxacin (CIPRO) 500 MG tablet Take 1 tablet (500 mg total) by mouth 2 (two) times daily. 02/11/15  Yes Ulyses Amor, PA-C   clopidogrel (PLAVIX) 75 MG tablet Take 75 mg by mouth at bedtime.  11/22/13  Yes Historical Provider, MD  colchicine 0.6 MG tablet Take 1 tablet (0.6 mg total) by mouth daily. Patient taking differently: Take 0.6 mg by mouth at bedtime.  01/21/15  Yes Reyne Dumas, MD  furosemide (LASIX) 80 MG tablet Take 80 mg by mouth daily.  10/20/13  Yes Historical Provider, MD  gabapentin (NEURONTIN) 600 MG tablet Take 1,200 mg by mouth 2 (two) times daily.    Yes Historical Provider, MD  glimepiride (AMARYL) 4 MG tablet Take 4 mg by mouth 2 (two) times daily.    Yes Historical Provider, MD  lisinopril (PRINIVIL,ZESTRIL) 20 MG tablet Take 1 tablet (  20 mg total) by mouth daily. Patient taking differently: Take 20 mg by mouth at bedtime.  01/26/15  Yes Reyne Dumas, MD  oxyCODONE-acetaminophen (PERCOCET/ROXICET) 5-325 MG tablet Take 1 tablet by mouth every 4 (four) hours as needed for severe pain. 02/22/15  Yes Angelia [name], MD  pantoprazole (PROTONIX) 40 MG tablet Take 1 tablet (40 mg total) by mouth daily. 01/21/15  Yes Reyne Dumas, MD  potassium gluconate 595 MG TABS tablet Take 595 mg by mouth at bedtime as needed (leg cramps).   Yes Historical Provider, MD  predniSONE (DELTASONE) 20 MG tablet Take 20 mg by mouth daily. 01/13/15  Yes Historical Provider, MD  oxyCODONE-acetaminophen (PERCOCET/ROXICET) 5-325 MG tablet Take 1 tablet by mouth every 4 (four) hours as needed for moderate pain or severe pain. Patient not taking: Reported on 03/01/2015 02/10/15   Joelene Millin A Trinh, PA-C   BP 104/70 mmHg  Pulse 97  Temp(Src) 97.7 F (36.5 C) (Oral)  SpO2 97% Physical Exam  Constitutional: He is oriented to person, place, and time. No distress.  HENT:  Head: Normocephalic and atraumatic.  Eyes: Pupils are equal, round, and reactive to light.  Cardiovascular: Normal rate, regular rhythm and normal heart sounds.   No murmur heard. 2+ radial pulses  Pulmonary/Chest: Effort normal and breath sounds normal.  No respiratory distress. He has no wheezes.  Abdominal: Soft. Bowel sounds are normal. There is no tenderness. There is no rebound.  Musculoskeletal: He exhibits no edema.  Diffuse 3+ edema of the right lower extremity to the thigh, open wound from prior amputation site of right first digit, approximately 6 cm x 3 cm, bleeding noted about the dressing, with dressing removal, mild bleeding noted but this was stopped with pressure, wound appears to have a granular base, no significant erythema, 1+ DP pulse  Neurological: He is alert and oriented to person, place, and time.  Skin: Skin is warm and dry.  Psychiatric: He has a normal mood and affect.  Nursing note and vitals reviewed.   ED Course  Procedures (including critical care time)  CRITICAL CARE Performed by: [name]   Total critical care time: 25 minutes  Critical care time was exclusive of separately billable procedures and treating other patients.  Critical care was necessary to treat or prevent imminent or life-threatening deterioration.  Critical care was time spent personally by me on the following activities: development of treatment plan with patient and/or surrogate as well as nursing, discussions with consultants, evaluation of patient's response to treatment, examination of patient, obtaining history from patient or surrogate, ordering and performing treatments and interventions, ordering and review of laboratory studies, ordering and review of radiographic studies, pulse oximetry and re-evaluation of patient's condition.  DIAGNOSTIC STUDIES: Oxygen Saturation is 97% on RA,  normal by my interpretation.    COORDINATION OF CARE: 12:06 AM Discussed treatment plan which includes lab work, IVF, and pain medication with pt at bedside and pt agreed to plan.  3:05 AM-Consult complete with Dr. Hal Hope (Hospitalist). Patient case explained and discussed. Agrees to admit patient for further evaluation and treatment.  Call ended at 3:07 AM   Labs Review Labs Reviewed  CBC WITH DIFFERENTIAL/PLATELET - Abnormal; Notable for the following:    RBC 3.08 (*)    Hemoglobin 9.6 (*)    HCT 31.0 (*)    MCV 100.6 (*)    RDW 17.2 (*)    All other components within normal limits  COMPREHENSIVE METABOLIC PANEL - Abnormal; Notable for the  following:    Glucose, Bld 217 (*)    BUN 85 (*)    Creatinine, Ser 3.46 (*)    Total Protein 6.1 (*)    Albumin 3.0 (*)    AST 13 (*)    ALT 7 (*)    GFR calc non Af Amer 16 (*)    GFR calc Af Amer 18 (*)    All other components within normal limits  URINALYSIS, ROUTINE W REFLEX MICROSCOPIC (NOT AT [facility]) - Abnormal; Notable for the following:    APPearance CLOUDY (*)    All other components within normal limits  HEMOGLOBIN AND HEMATOCRIT, BLOOD - Abnormal; Notable for the following:    Hemoglobin 8.8 (*)    HCT 28.5 (*)    All other components within normal limits  CULTURE, BLOOD (ROUTINE X 2)  CULTURE, BLOOD (ROUTINE X 2)  TROPONIN I  PROCALCITONIN  I-STAT CG4 LACTIC ACID, ED  I-STAT CG4 LACTIC ACID, ED  TYPE AND SCREEN    Imaging Review Dg Chest 2 View  03/01/2015  CLINICAL DATA:  Shortness of breath. EXAM: CHEST  2 VIEW COMPARISON:  01/26/2015 FINDINGS: The cardiomediastinal contours are unchanged, heart at the upper limits normal in size. Mild bibasilar atelectasis or scarring. Pulmonary vasculature is normal. No consolidation, pleural effusion, or pneumothorax. No acute osseous abnormalities are seen. IMPRESSION: Mild bibasilar atelectasis or scarring.  Otherwise no acute process. Electronically Signed   By: Jeb Levering M.D.   On: 03/01/2015 02:09   Dg Foot Complete Right  03/01/2015  CLINICAL DATA:  Status post great toe amputation with pain. EXAM: RIGHT FOOT COMPLETE - 3+ VIEW COMPARISON:  RIGHT foot radiograph January 16, 2015 FINDINGS: Status post interval amputation of first digit at proximal metatarsus. No destructive bony lesions. No acute fracture  deformity. No dislocation. Mid and forefoot soft tissue swelling without subcutaneous gas or radiopaque foreign bodies. Moderate plantar calcaneal spur. Minimal vascular calcifications. IMPRESSION: Status post first proximal metatarsus amputation. Soft tissue swelling. Electronically Signed   By: Elon Alas M.D.   On: 03/01/2015 02:18   I have personally reviewed and evaluated these lab results as part of my medical decision-making.   EKG Interpretation None      MDM   Final diagnoses:  AKI (acute kidney injury) ([place])  Open wound of foot, right, initial encounter  Other specified hypotension    Presents with concerns for bleeding right foot wound. Reports aggressive debridement earlier today. He is on Plavix. No active bleeding but there is notable blood on his bandaging. Wound was cleaned. Thrombin dressing was placed and it was rewrapped. Noted to be hypotensive upon arrival with a systolic blood pressure in 80s. Denies any recent infectious symptoms. He is not actively bleeding. Hemoglobin is stable. No leukocytosis. Lactate is normal. Plain films of the foot show no evidence of osteo-. Chest x-ray shows no evidence of pneumonia. Urine without evidence of UTI. He was given a total of 1.5 L of saline. Blood pressures improved to the 90s. I have reviewed his hospital records and it appears he had several episodes of borderline blood pressures in the 90s to 100s during his hospital stay. No signs or symptoms of sepsis or active bleeding. However, patient does have evidence of acute kidney injury with a creatinine of 3. This is new. Given total clinical picture, will admit for further management. Hospitalist is requesting blood cultures and procalcitonin level. This has been ordered.  I personally performed the services described in this documentation, which  was scribed in my presence. The recorded information has been reviewed and is accurate.    [name], MD 03/01/15 [phone]

## 2015-03-01 ENCOUNTER — Encounter (HOSPITAL_COMMUNITY): Payer: Self-pay

## 2015-03-01 ENCOUNTER — Emergency Department (HOSPITAL_COMMUNITY): Payer: Medicare Other

## 2015-03-01 DIAGNOSIS — I739 Peripheral vascular disease, unspecified: Secondary | ICD-10-CM | POA: Diagnosis present

## 2015-03-01 DIAGNOSIS — R42 Dizziness and giddiness: Secondary | ICD-10-CM | POA: Diagnosis present

## 2015-03-01 DIAGNOSIS — F17210 Nicotine dependence, cigarettes, uncomplicated: Secondary | ICD-10-CM | POA: Diagnosis present

## 2015-03-01 DIAGNOSIS — Z7982 Long term (current) use of aspirin: Secondary | ICD-10-CM | POA: Diagnosis not present

## 2015-03-01 DIAGNOSIS — I959 Hypotension, unspecified: Secondary | ICD-10-CM | POA: Diagnosis present

## 2015-03-01 DIAGNOSIS — E7800 Pure hypercholesterolemia, unspecified: Secondary | ICD-10-CM | POA: Diagnosis present

## 2015-03-01 DIAGNOSIS — N179 Acute kidney failure, unspecified: Secondary | ICD-10-CM | POA: Diagnosis present

## 2015-03-01 DIAGNOSIS — E1129 Type 2 diabetes mellitus with other diabetic kidney complication: Secondary | ICD-10-CM | POA: Diagnosis present

## 2015-03-01 DIAGNOSIS — Z833 Family history of diabetes mellitus: Secondary | ICD-10-CM | POA: Diagnosis not present

## 2015-03-01 DIAGNOSIS — E1122 Type 2 diabetes mellitus with diabetic chronic kidney disease: Secondary | ICD-10-CM | POA: Diagnosis present

## 2015-03-01 DIAGNOSIS — I952 Hypotension due to drugs: Secondary | ICD-10-CM | POA: Diagnosis not present

## 2015-03-01 DIAGNOSIS — Z7902 Long term (current) use of antithrombotics/antiplatelets: Secondary | ICD-10-CM | POA: Diagnosis not present

## 2015-03-01 DIAGNOSIS — K219 Gastro-esophageal reflux disease without esophagitis: Secondary | ICD-10-CM | POA: Diagnosis present

## 2015-03-01 DIAGNOSIS — Z66 Do not resuscitate: Secondary | ICD-10-CM | POA: Diagnosis present

## 2015-03-01 DIAGNOSIS — N189 Chronic kidney disease, unspecified: Secondary | ICD-10-CM

## 2015-03-01 DIAGNOSIS — Z96651 Presence of right artificial knee joint: Secondary | ICD-10-CM | POA: Diagnosis present

## 2015-03-01 DIAGNOSIS — Z8249 Family history of ischemic heart disease and other diseases of the circulatory system: Secondary | ICD-10-CM | POA: Diagnosis not present

## 2015-03-01 DIAGNOSIS — D649 Anemia, unspecified: Secondary | ICD-10-CM | POA: Diagnosis not present

## 2015-03-01 DIAGNOSIS — I129 Hypertensive chronic kidney disease with stage 1 through stage 4 chronic kidney disease, or unspecified chronic kidney disease: Secondary | ICD-10-CM | POA: Diagnosis present

## 2015-03-01 DIAGNOSIS — Z7952 Long term (current) use of systemic steroids: Secondary | ICD-10-CM | POA: Diagnosis not present

## 2015-03-01 DIAGNOSIS — Z800 Family history of malignant neoplasm of digestive organs: Secondary | ICD-10-CM | POA: Diagnosis not present

## 2015-03-01 DIAGNOSIS — M109 Gout, unspecified: Secondary | ICD-10-CM | POA: Diagnosis present

## 2015-03-01 DIAGNOSIS — E860 Dehydration: Secondary | ICD-10-CM | POA: Diagnosis present

## 2015-03-01 DIAGNOSIS — N183 Chronic kidney disease, stage 3 (moderate): Secondary | ICD-10-CM | POA: Diagnosis present

## 2015-03-01 DIAGNOSIS — Z79891 Long term (current) use of opiate analgesic: Secondary | ICD-10-CM | POA: Diagnosis not present

## 2015-03-01 DIAGNOSIS — T501X5A Adverse effect of loop [high-ceiling] diuretics, initial encounter: Secondary | ICD-10-CM | POA: Diagnosis present

## 2015-03-01 LAB — COMPREHENSIVE METABOLIC PANEL
ALT: 6 U/L — AB (ref 17–63)
ANION GAP: 7 (ref 5–15)
AST: 13 U/L — ABNORMAL LOW (ref 15–41)
Albumin: 2.6 g/dL — ABNORMAL LOW (ref 3.5–5.0)
Alkaline Phosphatase: 59 U/L (ref 38–126)
BUN: 83 mg/dL — ABNORMAL HIGH (ref 6–20)
CHLORIDE: 111 mmol/L (ref 101–111)
CO2: 23 mmol/L (ref 22–32)
CREATININE: 3.05 mg/dL — AB (ref 0.61–1.24)
Calcium: 8.3 mg/dL — ABNORMAL LOW (ref 8.9–10.3)
GFR, EST AFRICAN AMERICAN: 22 mL/min — AB (ref >60)
GFR, EST NON AFRICAN AMERICAN: 19 mL/min — AB (ref >60)
Glucose, Bld: 122 mg/dL — ABNORMAL HIGH (ref 65–99)
POTASSIUM: 4.9 mmol/L (ref 3.5–5.1)
Sodium: 141 mmol/L (ref 135–145)
Total Bilirubin: 0.3 mg/dL (ref 0.3–1.2)
Total Protein: 5.5 g/dL — ABNORMAL LOW (ref 6.5–8.1)

## 2015-03-01 LAB — URINALYSIS, ROUTINE W REFLEX MICROSCOPIC
BILIRUBIN URINE: NEGATIVE
GLUCOSE, UA: NEGATIVE mg/dL
HGB URINE DIPSTICK: NEGATIVE
KETONES UR: NEGATIVE mg/dL
Leukocytes, UA: NEGATIVE
Nitrite: NEGATIVE
PH: 5 (ref 5.0–8.0)
Protein, ur: NEGATIVE mg/dL
SPECIFIC GRAVITY, URINE: 1.01 (ref 1.005–1.030)

## 2015-03-01 LAB — HEMOGLOBIN AND HEMATOCRIT, BLOOD
HEMATOCRIT: 28.5 % — AB (ref 39.0–52.0)
Hemoglobin: 8.8 g/dL — ABNORMAL LOW (ref 13.0–17.0)

## 2015-03-01 LAB — TYPE AND SCREEN
ABO/RH(D): O NEG
ANTIBODY SCREEN: NEGATIVE

## 2015-03-01 LAB — CBC WITH DIFFERENTIAL/PLATELET
Basophils Absolute: 0 10*3/uL (ref 0.0–0.1)
Basophils Relative: 0 %
EOS ABS: 0.2 10*3/uL (ref 0.0–0.7)
Eosinophils Relative: 2 %
HCT: 24.8 % — ABNORMAL LOW (ref 39.0–52.0)
HEMOGLOBIN: 8.1 g/dL — AB (ref 13.0–17.0)
LYMPHS ABS: 2.3 10*3/uL (ref 0.7–4.0)
Lymphocytes Relative: 29 %
MCH: 32.9 pg (ref 26.0–34.0)
MCHC: 32.7 g/dL (ref 30.0–36.0)
MCV: 100.8 fL — ABNORMAL HIGH (ref 78.0–100.0)
Monocytes Absolute: 0.5 10*3/uL (ref 0.1–1.0)
Monocytes Relative: 6 %
NEUTROS PCT: 63 %
Neutro Abs: 4.9 10*3/uL (ref 1.7–7.7)
Platelets: 171 10*3/uL (ref 150–400)
RBC: 2.46 MIL/uL — AB (ref 4.22–5.81)
RDW: 17.2 % — ABNORMAL HIGH (ref 11.5–15.5)
WBC: 7.8 10*3/uL (ref 4.0–10.5)

## 2015-03-01 LAB — GLUCOSE, CAPILLARY
GLUCOSE-CAPILLARY: 126 mg/dL — AB (ref 65–99)
GLUCOSE-CAPILLARY: 112 mg/dL — AB (ref 65–99)
Glucose-Capillary: 238 mg/dL — ABNORMAL HIGH (ref 65–99)
Glucose-Capillary: 155 mg/dL — ABNORMAL HIGH (ref 65–99)
Glucose-Capillary: 139 mg/dL — ABNORMAL HIGH (ref 65–99)

## 2015-03-01 LAB — I-STAT CG4 LACTIC ACID, ED
Lactic Acid, Venous: 1.06 mmol/L (ref 0.5–2.0)
Lactic Acid, Venous: 1.46 mmol/L (ref 0.5–2.0)

## 2015-03-01 LAB — TROPONIN I: Troponin I: <0.03 ng/mL (ref <0.031)

## 2015-03-01 LAB — PROCALCITONIN: Procalcitonin: 0.12 ng/mL

## 2015-03-01 MED ORDER — ACETAMINOPHEN 650 MG RE SUPP: 650.0000 mg | Freq: Four times a day (QID) | RECTAL | Status: DC | PRN

## 2015-03-01 MED ORDER — "THROMBI-PAD 3""X3"" EX PADS"
MEDICATED_PAD | CUTANEOUS | Status: AC
  Administered 2015-03-01
  Filled 2015-03-01: qty 1

## 2015-03-01 MED ORDER — COLCHICINE 0.6 MG PO TABS
0.3000 mg | ORAL_TABLET | Freq: Every day | ORAL | Status: DC
  Administered 2015-03-01 – 2015-03-02 (×2): 0.3 mg via ORAL
  Filled 2015-03-01 (×2): qty 1

## 2015-03-01 MED ORDER — COLCHICINE 0.6 MG PO TABS: 0.6000 mg | ORAL_TABLET | Freq: Every day | ORAL | Status: DC

## 2015-03-01 MED ORDER — OXYCODONE-ACETAMINOPHEN 5-325 MG PO TABS
1.0000 | ORAL_TABLET | Freq: Once | ORAL | Status: AC
  Administered 2015-03-01: 1 via ORAL
  Filled 2015-03-01: qty 1

## 2015-03-01 MED ORDER — ONDANSETRON HCL 4 MG PO TABS: 4.0000 mg | ORAL_TABLET | Freq: Four times a day (QID) | ORAL | Status: DC | PRN

## 2015-03-01 MED ORDER — ALLOPURINOL 300 MG PO TABS
300.0000 mg | ORAL_TABLET | Freq: Two times a day (BID) | ORAL | Status: DC
  Administered 2015-03-01: 300 mg via ORAL
  Filled 2015-03-01: qty 1

## 2015-03-01 MED ORDER — SODIUM CHLORIDE 0.9 % IV BOLUS (SEPSIS)
1000.0000 mL | Freq: Once | INTRAVENOUS | Status: AC
  Administered 2015-03-01: 1000 mL via INTRAVENOUS

## 2015-03-01 MED ORDER — PREDNISONE 20 MG PO TABS
20.0000 mg | ORAL_TABLET | Freq: Every day | ORAL | Status: DC
  Administered 2015-03-02 – 2015-03-03 (×2): 20 mg via ORAL
  Filled 2015-03-01 (×2): qty 1

## 2015-03-01 MED ORDER — OXYCODONE-ACETAMINOPHEN 5-325 MG PO TABS
1.0000 | ORAL_TABLET | ORAL | Status: DC | PRN
  Administered 2015-03-02 – 2015-03-03 (×2): 1 via ORAL
  Filled 2015-03-01 (×2): qty 1

## 2015-03-01 MED ORDER — SODIUM CHLORIDE 0.9 % IV SOLN
INTRAVENOUS | Status: AC
  Administered 2015-03-01: 100 mL/h via INTRAVENOUS
  Administered 2015-03-02: 01:00:00 via INTRAVENOUS

## 2015-03-01 MED ORDER — SODIUM CHLORIDE 0.9 % IV BOLUS (SEPSIS)
500.0000 mL | Freq: Once | INTRAVENOUS | Status: AC
  Administered 2015-03-01: 500 mL via INTRAVENOUS

## 2015-03-01 MED ORDER — ALLOPURINOL 300 MG PO TABS
300.0000 mg | ORAL_TABLET | Freq: Every day | ORAL | Status: DC
  Administered 2015-03-02 – 2015-03-03 (×2): 300 mg via ORAL
  Filled 2015-03-01 (×2): qty 1

## 2015-03-01 MED ORDER — CIPROFLOXACIN IN D5W 400 MG/200ML IV SOLN
400.0000 mg | INTRAVENOUS | Status: DC
  Administered 2015-03-02 – 2015-03-03 (×2): 400 mg via INTRAVENOUS
  Filled 2015-03-01 (×2): qty 200

## 2015-03-01 MED ORDER — INSULIN ASPART 100 UNIT/ML ~~LOC~~ SOLN
0.0000 [IU] | Freq: Three times a day (TID) | SUBCUTANEOUS | Status: DC
  Administered 2015-03-01: 2 [IU] via SUBCUTANEOUS
  Administered 2015-03-01 – 2015-03-02 (×2): 3 [IU] via SUBCUTANEOUS
  Administered 2015-03-02: 5 [IU] via SUBCUTANEOUS

## 2015-03-01 MED ORDER — PREDNISONE 20 MG PO TABS: 20.0000 mg | ORAL_TABLET | Freq: Every day | ORAL | Status: DC

## 2015-03-01 MED ORDER — ATORVASTATIN CALCIUM 10 MG PO TABS
10.0000 mg | ORAL_TABLET | Freq: Every day | ORAL | Status: DC
  Administered 2015-03-01 – 2015-03-02 (×2): 10 mg via ORAL
  Filled 2015-03-01 (×2): qty 1

## 2015-03-01 MED ORDER — ONDANSETRON HCL 4 MG/2ML IJ SOLN: 4.0000 mg | Freq: Four times a day (QID) | INTRAMUSCULAR | Status: DC | PRN

## 2015-03-01 MED ORDER — HYDROCORTISONE NA SUCCINATE PF 100 MG IJ SOLR
50.0000 mg | Freq: Once | INTRAMUSCULAR | Status: AC
  Administered 2015-03-01: 50 mg via INTRAVENOUS
  Filled 2015-03-01: qty 2

## 2015-03-01 MED ORDER — CIPROFLOXACIN IN D5W 400 MG/200ML IV SOLN
400.0000 mg | Freq: Once | INTRAVENOUS | Status: AC
  Administered 2015-03-01: 400 mg via INTRAVENOUS
  Filled 2015-03-01: qty 200

## 2015-03-01 MED ORDER — ACETAMINOPHEN 325 MG PO TABS: 650.0000 mg | ORAL_TABLET | Freq: Four times a day (QID) | ORAL | Status: DC | PRN

## 2015-03-01 MED ORDER — ASPIRIN EC 81 MG PO TBEC
81.0000 mg | DELAYED_RELEASE_TABLET | Freq: Every day | ORAL | Status: DC
  Administered 2015-03-01 – 2015-03-02 (×2): 81 mg via ORAL
  Filled 2015-03-01 (×2): qty 1

## 2015-03-01 MED ORDER — PANTOPRAZOLE SODIUM 40 MG PO TBEC
40.0000 mg | DELAYED_RELEASE_TABLET | Freq: Every day | ORAL | Status: DC
  Administered 2015-03-01 – 2015-03-03 (×3): 40 mg via ORAL
  Filled 2015-03-01 (×3): qty 1

## 2015-03-01 MED ORDER — FENTANYL CITRATE (PF) 100 MCG/2ML IJ SOLN
25.0000 ug | Freq: Once | INTRAMUSCULAR | Status: AC
  Administered 2015-03-01: 25 ug via INTRAVENOUS
  Filled 2015-03-01: qty 2

## 2015-03-01 MED ORDER — CLOPIDOGREL BISULFATE 75 MG PO TABS
75.0000 mg | ORAL_TABLET | Freq: Every day | ORAL | Status: DC
  Administered 2015-03-01 – 2015-03-02 (×2): 75 mg via ORAL
  Filled 2015-03-01 (×2): qty 1

## 2015-03-01 MED ORDER — SODIUM CHLORIDE 0.9 % IV SOLN
Freq: Once | INTRAVENOUS | Status: AC
  Administered 2015-03-01: 1000 mL via INTRAVENOUS

## 2015-03-01 MED ORDER — GLIMEPIRIDE 4 MG PO TABS
4.0000 mg | ORAL_TABLET | Freq: Two times a day (BID) | ORAL | Status: DC
  Administered 2015-03-01 – 2015-03-03 (×5): 4 mg via ORAL
  Filled 2015-03-01 (×8): qty 1

## 2015-03-01 MED ORDER — GABAPENTIN 600 MG PO TABS
1200.0000 mg | ORAL_TABLET | Freq: Two times a day (BID) | ORAL | Status: DC
  Administered 2015-03-01 – 2015-03-03 (×5): 1200 mg via ORAL
  Filled 2015-03-01 (×5): qty 2

## 2015-03-01 NOTE — Care Management Note (Addendum)
Case Management Note  Patient Details  Name: MYKAIL STOKELY MRN: [id number] Date of Birth: [birth year]  Subjective/Objective:   Adm w hypotension, wound rt toe                 Action/Plan: lives at home w wife, pcp dr Arelia Sneddon, act w [facility]   Expected Discharge Date:                 Expected Discharge Plan:     In-House Referral:     Discharge planning Services     Post Acute Care Choice:    Choice offered to:     DME Arranged:    DME Agency:     HH Arranged:    [place] Agency:     Status of Service:     Medicare Important Message Given:    Date Medicare IM Given:    Medicare IM give by:    Date Additional Medicare IM Given:    Additional Medicare Important Message give by:     If discussed at [place] of Stay Meetings, dates discussed:    Additional Comments: ur review done  Lacretia Leigh, RN 03/01/2015, 8:41 AM

## 2015-03-01 NOTE — Progress Notes (Signed)
[facility]  Patient Status: Active (receiving services up to time of hospitalization)  [facility] is providing the following services: RN  If patient discharges after hours, please call [phone].   Marc [name] 03/01/2015, 11:28 AM

## 2015-03-01 NOTE — Progress Notes (Signed)
Pharmacy Antibiotic Note  Marc [name] is a 75 y.o. male admitted on 02/28/2015 with cellulitis at site of toe amputation done ~3wk ago, no evidence of osteo on Xray.  Pharmacy has been consulted for Cipro dosing (S.marcescens species on wound cx 02/07/15, sensitive to Cipro).  Pt in ARF, had ARF in 12/2014 as well.  Plan: Will begin Cipro 400mg  IV Q24H and monitor CBC, Cx, and SCr.  Height: 6\' 1"  (185.4 cm) Weight: 188 lb 11.4 oz (85.6 kg) IBW/kg (Calculated) : 79.9  Temp (24hrs), Avg:98.2 F (36.8 C), Min:97.7 F (36.5 C), Max:98.6 F (37 C)   Recent Labs Lab 02/28/15 2131 03/01/15 0022 03/01/15 0411  WBC 8.8  --   --   CREATININE 3.46*  --   --   LATICACIDVEN  --  1.06 1.46    Estimated Creatinine Clearance: 20.8 mL/min (by C-G formula based on Cr of 3.46).    No Known Allergies   Thank you for allowing pharmacy to be a part of this patient's care.  Wynona Neat, PharmD, BCPS  03/01/2015 6:07 AM

## 2015-03-01 NOTE — ED Notes (Signed)
Returned from xray

## 2015-03-01 NOTE — Progress Notes (Addendum)
eLink Pharmacist-Brief Progress Note Patient Name: Marc [name] DOB: 1940/11/08 MRN: [id number]   Date of Service  03/01/2015  HPI/Events of Note  Medications reviewed for renal dose adjustments.  eICU Interventions  Adjust allopurinol to 300 mg daily. Adjust colchicine to 0.3 mg daily.    eLink Provider: Silva Bandy, PharmD 03/01/2015, 4:00 PM

## 2015-03-01 NOTE — Progress Notes (Addendum)
   VASCULAR SURGERY ASSESSMENT & PLAN:  * This patient was admitted with hypotension. I was asked to check on his right  Great toe amputation site.  *  This is a 75 year old gentleman who presented with an extensive wound of his right great toe involving the metatarsal head. He underwent a right femoral to tibial peroneal trunk bypass with a vein graft, endarterectomy of the tibial peroneal trunk with vein patch angioplasty. 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 He has had some bleeding issues with this. He has had some swelling in the right leg and when he was seen in the office on 02/22/2015, he had a venous duplex scan which showed no evidence of DVT. He has excellent Doppler signals in his right foot with a patent bypass graft.  * Have written for dressing changes with hydrogel which should prevent the dressing from sticking which might cause further bleeding.  SUBJECTIVE: No complaints.  PHYSICAL EXAM: Filed Vitals:   03/01/15 0515 03/01/15 0530 03/01/15 0545 03/01/15 0600  BP: 110/51 129/95 88/48 104/42  Pulse: 72 73 61 64  Temp:      TempSrc:      Resp: 24 12 10 10   Height:      Weight:      SpO2: 100% 100% 97% 97%   Swelling right leg has improved. The wound is not bleeding. It appears well perfused.  LABS: Lab Results  Component Value Date   WBC 8.8 02/28/2015   HGB 8.8* 03/01/2015   HCT 28.5* 03/01/2015   MCV 100.6* 02/28/2015   PLT 237 02/28/2015   Lab Results  Component Value Date   CREATININE 3.46* 02/28/2015   Lab Results  Component Value Date   INR 1.12 02/07/2015   CBG (last 3)   Recent Labs  02/28/15 1407 03/01/15 0445  GLUCAP 194* 126*    Principal Problem:   Hypotension Active Problems:   Tobacco abuse disorder   Renal failure (ARF), acute on chronic (HCC)   Chronic anemia   Type 2 diabetes mellitus with renal complication [facility])    Gae Gallop [id number] 03/01/2015

## 2015-03-01 NOTE — H&P (Addendum)
[facility] History and Physical  Marc [name] [id number] DOB: Mar 17, 1940 DOA: 02/28/2015  Referring physician: Dr. Dina Rich. PCP: [name], MD  Specialists: Dr.Dickson. Vascular surgeon.  Chief Complaint: Bleeding from the right foot recent surgical site.  HPI: Marc [name] is a 75 y.o. male with history of peripheral vascular disease status post recent bypass surgery of the right lower extremity and first toe ray amputation presents to the ER because of increasing bleeding from the surgical site. In the ER patient had minimal bleeding and was dressed with thrombin following which bleeding was arrested. When patient was found to be hypotensive for which patient was given fluid boluses. Patient's creatinine also has worsened from baseline. Patient does not look septic. Denies any nausea vomiting or diarrhea. Patient states patient's Lasix was restarted last week after it was held for surgery. Patient's blood pressure is improved with fluids. Question has been admitted for hypotension and acute renal failure.   Review of Systems: As presented in the history of presenting illness, rest negative.  Past Medical History  Diagnosis Date  . Hypertension   . Ulcer disease   . Colon polyp   . Gout   . Bradycardia   . Hypercholesteremia   . PAD (peripheral artery disease) ([place])   . Chronic bronchitis ([place])   . Type II diabetes mellitus ([place])   . History of GI bleed 2007  . Gout   . Tobacco abuse disorder 03/10/2012  . GERD (gastroesophageal reflux disease)   . Pneumonia     denies  . ARF (acute renal failure) ([place]) 12/2014  . Neuropathy ([place])   . Arthritis   . Anemia     low iron   Past Surgical History  Procedure Laterality Date  . Splenectomy  ~ 1957  . Anterior cervical decomp/discectomy fusion  ~ 2007  . Upper endoscopy w/ sclerotherapy  ~ 2007  . Polypectomy    . Colonoscopy    . Iliac artery stent Left     left common/notes (02/04/2012)  . Hernia  repair  ~ 2007    UHR (02/04/2012)  . Tonsillectomy  ~ 1947  . Lumbar laminectomy  11/14  . Total knee arthroplasty Right 02/03/2013    Procedure: TOTAL KNEE ARTHROPLASTY;  Surgeon: [name], MD;  Location: [place];  Service: Orthopedics;  Laterality: Right;  . Lower extremity angiogram N/A 02/04/2012    Procedure: LOWER EXTREMITY ANGIOGRAM;  Surgeon: Lorretta Harp, MD;  Location: [facility] CATH LAB;  Service: Cardiovascular;  Laterality: N/A;  . Percutaneous stent intervention Left 02/04/2012    Procedure: PERCUTANEOUS STENT INTERVENTION;  Surgeon: Lorretta Harp, MD;  Location: [facility] CATH LAB;  Service: Cardiovascular;  Laterality: Left;  lt ext iliac stent  . Replacement total knee Right   . Back surgery    . Anterior lat lumbar fusion Left 01/19/2014    Procedure: LATERAL INTERBODY FUSION 1 LEVEL;  Surgeon: Sinclair Ship, MD;  Location: [place];  Service: Orthopedics;  Laterality: Left;  Left lumbar 3-4 lateral interbody fusion with instrumentation, allograft  . Peripheral vascular catheterization N/A 02/02/2015    Procedure: Abdominal Aortogram w/Lower Extremity;  Surgeon: Angelia [name], MD;  Location: [facility] CV LAB;  Service: Cardiovascular;  Laterality: N/A;  . Eye surgery Bilateral     cataract surgery with lens implant  . Bypass graft femoral-peroneal Right 02/07/2015    Procedure: BYPASS GRAFT FEMORAL-PERONEAL Trunk WITH VEIN graft  right leg.;  Surgeon: Angelia [name], MD;  [facility] OR;  Service: Vascular;  Laterality: Right;  . Amputation Right 02/07/2015    Procedure: GREAT TOE RAY AMPUTATION RIGHT ;  Surgeon: Angelia [name], MD;  Location: [place];  Service: Vascular;  Laterality: Right;  . Application of wound vac Right 02/07/2015    Procedure:  APPLICATION OF WOUND VAC right great toe amputation site.;  Surgeon: Angelia [name], MD;  Location: [place];  Service: Vascular;  Laterality: Right;  . Intraoperative arteriogram Right 02/07/2015    Procedure:  INTRA OPERATIVE ARTERIOGRAM;  Surgeon: Angelia [name], MD;  Location: [facility] OR;  Service: Vascular;  Laterality: Right;  . Endarterectomy tibioperoneal Right 02/07/2015    Procedure: ENDARTERECTOMY TIBIOPERONEAL;  Surgeon: Angelia [name], MD;  Location: [facility] OR;  Service: Vascular;  Laterality: Right;  . Patch angioplasty Right 02/07/2015    Procedure: Vein PATCH ANGIOPLASTY to tibioperoneal trunk;  Surgeon: Angelia [name], MD;  Location: [place];  Service: Vascular;  Laterality: Right;   Social History:  reports that he has been smoking Cigarettes.  He has a 87 pack-year smoking history. He has quit using smokeless tobacco. His smokeless tobacco use included Chew. He reports that he drinks alcohol. He reports that he does not use illicit drugs. Where does patient live home. Can patient participate in ADLs? Yes.  No Known Allergies  Family History:  Family History  Problem Relation Age of Onset  . Colon cancer Paternal Uncle     Uncle  . Heart disease Father   . Hypertension Father   . Heart attack Father   . Heart disease Mother   . Diabetes Mother   . Hypertension Mother       Prior to Admission medications   Medication Sig Start Date End Date Taking? Authorizing Provider  allopurinol (ZYLOPRIM) 300 MG tablet Take 300 mg by mouth 2 (two) times daily.    Yes Historical Provider, MD  aspirin EC 81 MG tablet Take 81 mg by mouth at bedtime.    Yes Historical Provider, MD  atorvastatin (LIPITOR) 10 MG tablet Take 10 mg by mouth at bedtime.  11/23/14  Yes Historical Provider, MD  ciprofloxacin (CIPRO) 500 MG tablet Take 1 tablet (500 mg total) by mouth 2 (two) times daily. 02/11/15  Yes Ulyses Amor, PA-C  clopidogrel (PLAVIX) 75 MG tablet Take 75 mg by mouth at bedtime.  11/22/13  Yes Historical Provider, MD  colchicine 0.6 MG tablet Take 1 tablet (0.6 mg total) by mouth daily. Patient taking differently: Take 0.6 mg by mouth at bedtime.  01/21/15  Yes Reyne Dumas, MD   furosemide (LASIX) 80 MG tablet Take 80 mg by mouth daily.  10/20/13  Yes Historical Provider, MD  gabapentin (NEURONTIN) 600 MG tablet Take 1,200 mg by mouth 2 (two) times daily.    Yes Historical Provider, MD  glimepiride (AMARYL) 4 MG tablet Take 4 mg by mouth 2 (two) times daily.    Yes Historical Provider, MD  lisinopril (PRINIVIL,ZESTRIL) 20 MG tablet Take 1 tablet (20 mg total) by mouth daily. Patient taking differently: Take 20 mg by mouth at bedtime.  01/26/15  Yes Reyne Dumas, MD  oxyCODONE-acetaminophen (PERCOCET/ROXICET) 5-325 MG tablet Take 1 tablet by mouth every 4 (four) hours as needed for severe pain. 02/22/15  Yes Angelia [name], MD  pantoprazole (PROTONIX) 40 MG tablet Take 1 tablet (40 mg total) by mouth daily. 01/21/15  Yes Reyne Dumas, MD  potassium gluconate 595 MG TABS tablet Take 595 mg by mouth at  bedtime as needed (leg cramps).   Yes Historical Provider, MD  predniSONE (DELTASONE) 20 MG tablet Take 20 mg by mouth daily. 01/13/15  Yes Historical Provider, MD  oxyCODONE-acetaminophen (PERCOCET/ROXICET) 5-325 MG tablet Take 1 tablet by mouth every 4 (four) hours as needed for moderate pain or severe pain. Patient not taking: Reported on 03/01/2015 02/10/15   Alvia Grove, PA-C    Physical Exam: Filed Vitals:   03/01/15 0545 03/01/15 0600 03/01/15 0752 03/01/15 0800  BP: 88/48 104/42 91/39 113/62  Pulse: 61 64 58 93  Temp:   97.1 F (36.2 C)   TempSrc:   Oral   Resp: 10 10 11 18   Height:      Weight:      SpO2: 97% 97% 99% 95%     General:  Moderately built and nourished.  Eyes: Anicteric. No pallor.  ENT: No discharge from the ears eyes nose and mouth.  Neck: No neck rigidity.  Cardiovascular: S1-S2.  Respiratory: No rhonchi or crepitations.  Abdomen: Soft nontender bowel sounds present.  Skin: Right foot has been dressed. No bleeding seen on the dressing.  Musculoskeletal: Right lower extremity edema.  Psychiatric: Appears  normal.  Neurologic: Alert awake oriented to time place and person. Moves all extremities.  Labs on Admission:  Basic Metabolic Panel:  Recent Labs Lab 02/28/15 2131  NA 143  K 5.0  CL 105  CO2 26  GLUCOSE 217*  BUN 85*  CREATININE 3.46*  CALCIUM 8.9   Liver Function Tests:  Recent Labs Lab 02/28/15 2131  AST 13*  ALT 7*  ALKPHOS 72  BILITOT 0.3  PROT 6.1*  ALBUMIN 3.0*   No results for input(s): LIPASE, AMYLASE in the last 168 hours. No results for input(s): AMMONIA in the last 168 hours. CBC:  Recent Labs Lab 02/28/15 2131 03/01/15 0150  WBC 8.8  --   NEUTROABS 5.9  --   HGB 9.6* 8.8*  HCT 31.0* 28.5*  MCV 100.6*  --   PLT 237  --    Cardiac Enzymes:  Recent Labs Lab 03/01/15 0358  TROPONINI <0.03    BNP (last 3 results) No results for input(s): BNP in the last 8760 hours.  ProBNP (last 3 results) No results for input(s): PROBNP in the last 8760 hours.  CBG:  Recent Labs Lab 02/28/15 1407 03/01/15 0445  GLUCAP 194* 126*    Radiological Exams on Admission: Dg Chest 2 View  03/01/2015  CLINICAL DATA:  Shortness of breath. EXAM: CHEST  2 VIEW COMPARISON:  01/26/2015 FINDINGS: The cardiomediastinal contours are unchanged, heart at the upper limits normal in size. Mild bibasilar atelectasis or scarring. Pulmonary vasculature is normal. No consolidation, pleural effusion, or pneumothorax. No acute osseous abnormalities are seen. IMPRESSION: Mild bibasilar atelectasis or scarring.  Otherwise no acute process. Electronically Signed   By: Jeb Levering M.D.   On: 03/01/2015 02:09   Dg Foot Complete Right  03/01/2015  CLINICAL DATA:  Status post great toe amputation with pain. EXAM: RIGHT FOOT COMPLETE - 3+ VIEW COMPARISON:  RIGHT foot radiograph January 16, 2015 FINDINGS: Status post interval amputation of first digit at proximal metatarsus. No destructive bony lesions. No acute fracture deformity. No dislocation. Mid and forefoot soft tissue  swelling without subcutaneous gas or radiopaque foreign bodies. Moderate plantar calcaneal spur. Minimal vascular calcifications. IMPRESSION: Status post first proximal metatarsus amputation. Soft tissue swelling. Electronically Signed   By: Elon Alas M.D.   On: 03/01/2015 02:18     Assessment/Plan  Principal Problem:   Hypotension Active Problems:   Tobacco abuse disorder   Renal failure (ARF), acute on chronic (HCC)   Chronic anemia   Type 2 diabetes mellitus with renal complication (HCC)   1. Hypotension - probably from dehydration. At this time will hold patient's Lasix and lisinopril. Continue with hydration and closely follow intake output and metabolic panel. Patient is septic. Blood cultures and procalcitonin levels have been ordered. 2. Acute renal failure on chronic kidney disease stage III - probably from hypotension and dehydration. Holding off Lasix and lisinopril. Postoperative poor intake output and metabolic panel. 3. Right leg bleeding from the recent operative site - bleeding has stopped at this time. I have discussed with Dr. Doren Custard on-call vascular surgeon will be seeing patient in consult. Patient is on empiric Cipro as advised by Dr. Doren Custard which will be dosed per pharmacy since patient has renal failure. 4. Diabetes mellitus type 2 - will continue home medications. 5. History of gout - continue present medications. If creatinine does not improve then may have to hold colchicine and decrease allopurinol dose. 6. Chronic anemia - follow CBC. 7. Recent peripheral vascular disease surgery for right lower extremity.  Patient's right lower extremity is any medicine as per the patient, patient has had DVT studies and Dr. Dix's office which was -2 days ago. This needs to be confirmed. Patient is on chronic steroids for low back pain. I have ordered 1 dose of IV hydrocortisone for stress dose.   DVT Prophylaxis SCDs.  Code Status: DO NOT RESUSCITATE.  Family  Communication: Discussed with patient.  Disposition Plan: Admit to inpatient.    KAKRAKANDY,ARSHAD N. [facility] Pager [phone].  If 7PM-7AM, please contact night-coverage [email or web address] Password [facility] 03/01/2015, 8:27 AM

## 2015-03-02 DIAGNOSIS — E1122 Type 2 diabetes mellitus with diabetic chronic kidney disease: Secondary | ICD-10-CM

## 2015-03-02 DIAGNOSIS — N189 Chronic kidney disease, unspecified: Secondary | ICD-10-CM

## 2015-03-02 DIAGNOSIS — N179 Acute kidney failure, unspecified: Secondary | ICD-10-CM

## 2015-03-02 DIAGNOSIS — I959 Hypotension, unspecified: Principal | ICD-10-CM

## 2015-03-02 DIAGNOSIS — N183 Chronic kidney disease, stage 3 (moderate): Secondary | ICD-10-CM

## 2015-03-02 LAB — GLUCOSE, CAPILLARY
GLUCOSE-CAPILLARY: 84 mg/dL (ref 65–99)
GLUCOSE-CAPILLARY: 203 mg/dL — AB (ref 65–99)
GLUCOSE-CAPILLARY: 214 mg/dL — AB (ref 65–99)
Glucose-Capillary: 259 mg/dL — ABNORMAL HIGH (ref 65–99)

## 2015-03-02 MED ORDER — SODIUM CHLORIDE 0.9 % IV SOLN: INTRAVENOUS | Status: DC

## 2015-03-02 MED ORDER — SODIUM CHLORIDE 0.9 % IV SOLN
INTRAVENOUS | Status: AC
Start: 2015-03-02 — End: 2015-03-02
  Administered 2015-03-02: 09:00:00 via INTRAVENOUS

## 2015-03-02 MED ORDER — SODIUM CHLORIDE 0.9 % IV BOLUS (SEPSIS)
500.0000 mL | Freq: Once | INTRAVENOUS | Status: AC
  Administered 2015-03-02: 500 mL via INTRAVENOUS

## 2015-03-02 NOTE — Progress Notes (Signed)
   VASCULAR SURGERY ASSESSMENT & PLAN:  * Right great toe amputation site healing adequately. He is already scheduled for a follow up visit with me. He will continue dressing changes with hydrogel which can be done daily.  *  His bypass graft is patent.  SUBJECTIVE: No complaints.  PHYSICAL EXAM: Filed Vitals:   03/02/15 0735 03/02/15 0800 03/02/15 1000 03/02/15 1113  BP: 125/56 113/52 108/57 117/58  Pulse: 53 51 55 54  Temp: 98.4 F (36.9 C)   97.5 F (36.4 C)  TempSrc: Oral   Oral  Resp: 11 12 14 13   Height:      Weight:      SpO2: 99% 98% 98% 96%   Palpable right popliteal pulse. Wounds right foot inspected and appears to be healing adequately. No further bleeding.   LABS: Lab Results  Component Value Date   WBC 7.8 03/01/2015   HGB 8.1* 03/01/2015   HCT 24.8* 03/01/2015   MCV 100.8* 03/01/2015   PLT 171 03/01/2015   Lab Results  Component Value Date   CREATININE 3.05* 03/01/2015   Lab Results  Component Value Date   INR 1.12 02/07/2015   CBG (last 3)   Recent Labs  03/01/15 2127 03/02/15 0737 03/02/15 1113  GLUCAP 139* 84 203*    Principal Problem:   Hypotension Active Problems:   Tobacco abuse disorder   Renal failure (ARF), acute on chronic (HCC)   Chronic anemia   Type 2 diabetes mellitus with renal complication [facility])    Gae Gallop [id number] 03/02/2015

## 2015-03-02 NOTE — Progress Notes (Signed)
[facility] PROGRESS NOTE    Progress Note   Marc [name] [id number] DOB: 12-07-1940 DOA: 02/28/2015 PCP: [name], MD   Brief Narrative:   Marc [name] is an 75 y.o. male 75 y.o. male with history of peripheral vascular disease status post recent bypass surgery of the right lower extremity and first toe ray amputation presents to the ER because of increasing bleeding from the surgical site. patient was found to be hypotensive for which patient was given fluid boluses. Patient's creatinine also has worsened from baseline  Assessment/Plan:  Hypotension: Hypotension resolved, with IV hydration. pro calcitonin < 0.5, lactic acid < 2.0. Likely medication induced, the patient to be transferred to floor.  Renal failure (ARF), acute on chronic [facility]): Likely pre-renal and due to medication (ACE-I). Hold ACE-I and lasix. Improving with conservative management. Continue IV fluid hydration recheck labs in the morning.  Right leg bleeding from the recent operative site - bleeding has stopped at this time: Appreciate vascular surgery assistance. Doppler negative for DVT.  Chronic anemia: hbg stable. Cc in the morning.  Type 2 diabetes mellitus with renal complication (HCC) Cont home regimen.    DVT Prophylaxis - Lovenox ordered.  Family Communication: none Disposition Plan: Transfer to floor, home in am. Code Status:     Code Status Orders        Start     Ordered   03/01/15 0603  Do not attempt resuscitation (DNR)   Continuous    Question Answer Comment  In the event of cardiac or respiratory ARREST Do not call a "code blue"   In the event of cardiac or respiratory ARREST Do not perform Intubation, CPR, defibrillation or ACLS   In the event of cardiac or respiratory ARREST Use medication by any route, position, wound care, and other measures to relive pain and suffering. May use oxygen, suction and manual treatment of airway obstruction as  needed for comfort.      03/01/15 0603    Code Status History    Date Active Date Inactive Code Status Order ID Comments User Context   02/07/2015  8:22 PM 02/11/2015  9:43 PM Full Code [id number]  Alvia Grove, PA-C Inpatient   02/02/2015  6:36 PM 02/03/2015  1:41 PM Full Code [id number]  Angelia [name], MD Inpatient   01/16/2015  4:32 PM 01/21/2015  6:53 PM Full Code [id number]  Radene Gunning, NP Inpatient   07/26/2014 10:44 AM 07/27/2014  3:19 AM Full Code [id number]  Sandi Mariscal, MD HOV   01/19/2014  2:42 PM 01/21/2014  4:40 PM Full Code [id number]  Justice Britain, PA-C Inpatient   09/09/2013  5:39 PM 09/10/2013  7:08 PM Full Code [id number]  Marybelle Killings, MD Inpatient   09/06/2013  1:52 PM 09/09/2013  5:39 PM DNR [id number]  Wilber Oliphant, MD Inpatient   02/03/2013  5:29 PM 02/08/2013  6:23 PM Full Code [id number]  Leighton Parody, PA-C Inpatient   12/03/2012  1:42 PM 12/07/2012  9:21 PM Full Code [id number]  Justice Britain, PA-C Inpatient    Advance Directive Documentation        Most Recent Value   Type of Advance Directive  Healthcare Power of Attorney, Living will   Pre-existing out of facility DNR order (yellow form or pink MOST form)     "MOST" Form in Place?          IV Access:    Peripheral IV   Procedures and diagnostic studies:  Dg Chest 2 View  03/01/2015  CLINICAL DATA:  Shortness of breath. EXAM: CHEST  2 VIEW COMPARISON:  01/26/2015 FINDINGS: The cardiomediastinal contours are unchanged, heart at the upper limits normal in size. Mild bibasilar atelectasis or scarring. Pulmonary vasculature is normal. No consolidation, pleural effusion, or pneumothorax. No acute osseous abnormalities are seen. IMPRESSION: Mild bibasilar atelectasis or scarring.  Otherwise no acute process. Electronically Signed   By: Jeb Levering M.D.   On: 03/01/2015 02:09   Dg Foot Complete Right  03/01/2015  CLINICAL DATA:  Status post great toe amputation with pain. EXAM: RIGHT FOOT COMPLETE - 3+  VIEW COMPARISON:  RIGHT foot radiograph January 16, 2015 FINDINGS: Status post interval amputation of first digit at proximal metatarsus. No destructive bony lesions. No acute fracture deformity. No dislocation. Mid and forefoot soft tissue swelling without subcutaneous gas or radiopaque foreign bodies. Moderate plantar calcaneal spur. Minimal vascular calcifications. IMPRESSION: Status post first proximal metatarsus amputation. Soft tissue swelling. Electronically Signed   By: Elon Alas M.D.   On: 03/01/2015 02:18     Medical Consultants:    None.  Anti-Infectives:   Anti-infectives    Start     Dose/Rate Route Frequency Ordered Stop   03/02/15 0600  ciprofloxacin (CIPRO) IVPB 400 mg     400 mg 200 mL/hr over 60 Minutes Intravenous Every 24 hours 03/01/15 0615     03/01/15 0630  ciprofloxacin (CIPRO) IVPB 400 mg     400 mg 200 mL/hr over 60 Minutes Intravenous  Once 03/01/15 0615 03/01/15 0830      Subjective:    Marc [name] feels great, wants to go home.  Objective:    Filed Vitals:   03/02/15 0500 03/02/15 0506 03/02/15 0600 03/02/15 0700  BP:   116/62   Pulse: 56  46 55  Temp:  98.5 F (36.9 C)    TempSrc:      Resp: 11  12 8   Height:      Weight:  81.1 kg (178 lb 12.7 oz)    SpO2: 97%  97% 97%    Intake/Output Summary (Last 24 hours) at 03/02/15 0734 Last data filed at 03/02/15 0600  Gross per 24 hour  Intake   3320 ml  Output   1925 ml  Net   1395 ml   Filed Weights   03/01/15 0449 03/02/15 0506  Weight: 85.6 kg (188 lb 11.4 oz) 81.1 kg (178 lb 12.7 oz)    Exam: Gen:  NAD Cardiovascular:  RRR. Chest and lungs:   CTAB Abdomen:  Abdomen soft, NT/ND, + BS Extremities:  No edema   Data Reviewed:    Labs: Basic Metabolic Panel:  Recent Labs Lab 02/28/15 2131 03/01/15 0809  NA 143 141  K 5.0 4.9  CL 105 111  CO2 26 23  GLUCOSE 217* 122*  BUN 85* 83*  CREATININE 3.46* 3.05*  CALCIUM 8.9 8.3*   GFR Estimated Creatinine  Clearance: 23.6 mL/min (by C-G formula based on Cr of 3.05). Liver Function Tests:  Recent Labs Lab 02/28/15 2131 03/01/15 0809  AST 13* 13*  ALT 7* 6*  ALKPHOS 72 59  BILITOT 0.3 0.3  PROT 6.1* 5.5*  ALBUMIN 3.0* 2.6*   No results for input(s): LIPASE, AMYLASE in the last 168 hours. No results for input(s): AMMONIA in the last 168 hours. Coagulation profile No results for input(s): INR, PROTIME in the last 168 hours.  CBC:  Recent Labs Lab 02/28/15 2131 03/01/15 0150 03/01/15 0809  WBC 8.8  --  7.8  NEUTROABS 5.9  --  4.9  HGB 9.6* 8.8* 8.1*  HCT 31.0* 28.5* 24.8*  MCV 100.6*  --  100.8*  PLT 237  --  171   Cardiac Enzymes:  Recent Labs Lab 03/01/15 0358 03/01/15 0809  TROPONINI <0.03 <0.03   BNP (last 3 results) No results for input(s): PROBNP in the last 8760 hours. CBG:  Recent Labs Lab 03/01/15 0445 03/01/15 0810 03/01/15 1247 03/01/15 1730 03/01/15 2127  GLUCAP 126* 112* 238* 155* 139*   D-Dimer: No results for input(s): DDIMER in the last 72 hours. Hgb A1c: No results for input(s): HGBA1C in the last 72 hours. Lipid Profile: No results for input(s): CHOL, HDL, LDLCALC, TRIG, CHOLHDL, LDLDIRECT in the last 72 hours. Thyroid function studies: No results for input(s): TSH, T4TOTAL, T3FREE, THYROIDAB in the last 72 hours.  Invalid input(s): FREET3 Anemia work up: No results for input(s): VITAMINB12, FOLATE, FERRITIN, TIBC, IRON, RETICCTPCT in the last 72 hours. Sepsis Labs:  Recent Labs Lab 02/28/15 2131 03/01/15 0022 03/01/15 0358 03/01/15 0411 03/01/15 0809  PROCALCITON  --   --  0.12  --   --   WBC 8.8  --   --   --  7.8  LATICACIDVEN  --  1.06  --  1.46  --    Microbiology No results found for this or any previous visit (from the past 240 hour(s)).   Medications:   . allopurinol  300 mg Oral Daily  . aspirin EC  81 mg Oral QHS  . atorvastatin  10 mg Oral QHS  . ciprofloxacin  400 mg Intravenous Q24H  . clopidogrel  75 mg  Oral QHS  . colchicine  0.3 mg Oral QHS  . gabapentin  1,200 mg Oral BID  . glimepiride  4 mg Oral BID WC  . insulin aspart  0-9 Units Subcutaneous TID WC  . pantoprazole  40 mg Oral Daily  . predniSONE  20 mg Oral Q breakfast   Continuous Infusions:   Time spent: 25 min   LOS: 1 day   Marc [name]  [facility] Pager [phone]  *Please refer to [email or web address], password [email or web address] to get updated schedule on who will round on this patient, as hospitalists switch teams weekly. If 7PM-7AM, please contact night-coverage at [email or web address], password [email or web address] for any overnight needs.  03/02/2015, 7:34 AM

## 2015-03-02 NOTE — Care Management Note (Signed)
Case Management Note  Patient Details  Name: Marc [name] MRN: [id number] Date of Birth: Mar 30, 1940  Subjective/Objective:   Adm w hypotension, wound toe                 Action/Plan: lives w wife, act w ahc   Expected Discharge Date:  03/03/15               Expected Discharge Plan:  [place]  In-House Referral:     Discharge planning Services  CM Consult  Post Acute Care Choice:  Resumption of Svcs/PTA Provider Choice offered to:     DME Arranged:    DME Agency:     HH Arranged:  RN [place] Agency:  [place]  Status of Service:     Medicare Important Message Given:    Date Medicare IM Given:    Medicare IM give by:    Date Additional Medicare IM Given:    Additional Medicare Important Message give by:     If discussed at [place] of Stay Meetings, dates discussed:    Additional Comments: alerted donna w adv homecare that maybe dc on 2-3 Lacretia Leigh, RN 03/02/2015, 9:58 AM

## 2015-03-02 NOTE — Progress Notes (Signed)
Attempted to call report and Nurse Seymour Bars is currently at bedside providing care for another patient and will return the call

## 2015-03-03 DIAGNOSIS — D649 Anemia, unspecified: Secondary | ICD-10-CM

## 2015-03-03 DIAGNOSIS — I952 Hypotension due to drugs: Secondary | ICD-10-CM

## 2015-03-03 LAB — BASIC METABOLIC PANEL
ANION GAP: 9 (ref 5–15)
BUN: 51 mg/dL — ABNORMAL HIGH (ref 6–20)
CALCIUM: 8.6 mg/dL — AB (ref 8.9–10.3)
CHLORIDE: 113 mmol/L — AB (ref 101–111)
CO2: 23 mmol/L (ref 22–32)
Creatinine, Ser: 1.87 mg/dL — ABNORMAL HIGH (ref 0.61–1.24)
GFR calc non Af Amer: 34 mL/min — ABNORMAL LOW (ref >60)
GFR, EST AFRICAN AMERICAN: 39 mL/min — AB (ref >60)
GLUCOSE: 88 mg/dL (ref 65–99)
Potassium: 4.5 mmol/L (ref 3.5–5.1)
Sodium: 145 mmol/L (ref 135–145)

## 2015-03-03 LAB — GLUCOSE, CAPILLARY: Glucose-Capillary: 93 mg/dL (ref 65–99)

## 2015-03-03 LAB — PROCALCITONIN: Procalcitonin: <0.1 ng/mL

## 2015-03-03 MED ORDER — FUROSEMIDE 80 MG PO TABS: 80.0000 mg | ORAL_TABLET | Freq: Every day | ORAL | Status: DC

## 2015-03-03 MED ORDER — LISINOPRIL 20 MG PO TABS: 20.0000 mg | ORAL_TABLET | Freq: Every day | ORAL | Status: DC

## 2015-03-03 NOTE — Progress Notes (Signed)
[facility] [place] [facility]  Subjective  - Doing well wants to go home.   Objective 123/55 47 97.4 F (36.3 C) (Axillary) 14 100%  Intake/Output Summary (Last 24 hours) at 03/03/15 0931 Last data filed at 03/03/15 0753  Gross per 24 hour  Intake    500 ml  Output   1250 ml  Net   -750 ml    Right foot first ray amputation with malodor, skin edges look healthy Graft patent  Assessment/Planning:  Dressing changed with hydrogel HH RN for dressing changes and wound check  Laurence Slate [facility] 03/03/2015 9:31 AM --  Laboratory Lab Results:  Recent Labs  02/28/15 2131 03/01/15 0150 03/01/15 0809  WBC 8.8  --  7.8  HGB 9.6* 8.8* 8.1*  HCT 31.0* 28.5* 24.8*  PLT 237  --  171   BMET  Recent Labs  03/01/15 0809 03/03/15 0552  NA 141 145  K 4.9 4.5  CL 111 113*  CO2 23 23  GLUCOSE 122* 88  BUN 83* 51*  CREATININE 3.05* 1.87*  CALCIUM 8.3* 8.6*    COAG Lab Results  Component Value Date   INR 1.12 02/07/2015   INR 1.03 07/26/2014   INR 1.05 01/12/2014   No results found for: PTT

## 2015-03-03 NOTE — Care Management Important Message (Signed)
Important Message  Patient Details  Name: Marc [name] MRN: [id number] Date of Birth: 1940-11-17   Medicare Important Message Given:  Yes    Nathen May 03/03/2015, 11:24 AM

## 2015-03-03 NOTE — Progress Notes (Signed)
Patient discharge teaching given, including activity, diet, follow-up appoints, and medications. Patient verbalized understanding of all discharge instructions. IV access was d/c'd. Vitals are stable. Skin is intact except as charted in most recent assessments. Pt to be escorted out by NT, to be driven home by family. 

## 2015-03-03 NOTE — Discharge Summary (Signed)
Physician Discharge Summary  Marc SLEMMER [id number] DOB: February 14, 1940 DOA: 02/28/2015  PCP: [name], MD  Admit date: 02/28/2015 Discharge date: 03/03/2015  Time spent: 35 minutes  Recommendations for Outpatient Follow-up:  1. Follow-up with PCP in one week. 2. Follow up vascular surgery in 2-4 weeks. 3. Check a basic metabolic panel in next appointment.   Discharge Diagnoses:  Principal Problem:   Hypotension Active Problems:   Tobacco abuse disorder   Renal failure (ARF), acute on chronic (HCC)   Chronic anemia   Type 2 diabetes mellitus with renal complication [facility])   Discharge Condition: stable  Diet recommendation: low sodium fluid restricted diet  Filed Weights   03/01/15 0449 03/02/15 0506 03/03/15 0649  Weight: 85.6 kg (188 lb 11.4 oz) 81.1 kg (178 lb 12.7 oz) 87 kg (191 lb 12.8 oz)    History of present illness:  Marc [name] 75-year-old male with past medical history of peripheral vascular disease status post recent bypass surgery on the right lower extremity and first ray amputation presents to the ED with minimal bleeding from the site. Patient was found to be hypotensive in the ED with a rising creatinine from baseline.  Hospital Course:  Hypotension: Resolved with IV fluid hydration, blood calcitonin was less than 0.5, lactic acid was less than 2.0. Likely medication induced.  Acute kidney injury: Likely prerenal due to Lasix and ACE inhibitor. These were held on admission and he was treated with IV fluid hydration. His creatinine returned close to baseline. Will resume ACE inhibitor and Lasix in 1 week.  Right leg bleeding from the recent operative site - bleeding has stopped at this time: Appreciate vascular surgery assistance, they recommended no further intervention. Doppler negative for DVT.  Chronic anemia: hbg stable.   Type 2 diabetes mellitus with renal complication (HCC) Cont home regimen.  Procedures:  CXR  Lower extremity  Doppler negative for DVT.  Consultations:  Vascular surgery  Discharge Exam: Filed Vitals:   03/02/15 2232 03/03/15 0649  BP: 125/48 123/55  Pulse: 62 47  Temp: 98.2 F (36.8 C) 97.4 F (36.3 C)  Resp: 17 14    General: A&O x3 Cardiovascular: RRR Respiratory: good air movement CTA B/L  Discharge Instructions   Discharge Instructions    Diet - low sodium heart healthy    Complete by:  As directed      Increase activity slowly    Complete by:  As directed           Current Discharge Medication List    CONTINUE these medications which have CHANGED   Details  furosemide (LASIX) 80 MG tablet Take 1 tablet (80 mg total) by mouth daily.    lisinopril (PRINIVIL,ZESTRIL) 20 MG tablet Take 1 tablet (20 mg total) by mouth daily. Qty: 30 tablet, Refills: 0      CONTINUE these medications which have NOT CHANGED   Details  allopurinol (ZYLOPRIM) 300 MG tablet Take 300 mg by mouth 2 (two) times daily.     aspirin EC 81 MG tablet Take 81 mg by mouth at bedtime.     atorvastatin (LIPITOR) 10 MG tablet Take 10 mg by mouth at bedtime.     clopidogrel (PLAVIX) 75 MG tablet Take 75 mg by mouth at bedtime.     colchicine 0.6 MG tablet Take 1 tablet (0.6 mg total) by mouth daily. Qty: 10 tablet, Refills: 0    gabapentin (NEURONTIN) 600 MG tablet Take 1,200 mg by mouth 2 (two) times daily.  glimepiride (AMARYL) 4 MG tablet Take 4 mg by mouth 2 (two) times daily.     oxyCODONE-acetaminophen (PERCOCET/ROXICET) 5-325 MG tablet Take 1 tablet by mouth every 4 (four) hours as needed for severe pain. Qty: 30 tablet, Refills: 0    pantoprazole (PROTONIX) 40 MG tablet Take 1 tablet (40 mg total) by mouth daily. Qty: 30 tablet, Refills: 0    potassium gluconate 595 MG TABS tablet Take 595 mg by mouth at bedtime as needed (leg cramps).    predniSONE (DELTASONE) 20 MG tablet Take 20 mg by mouth daily.      STOP taking these medications     ciprofloxacin (CIPRO) 500 MG tablet         No Known Allergies Follow-up Information    Follow up with [name], MD In 2 weeks.   Specialty:  Family Medicine   Why:  hospital follow up   Contact information:   [place] [address] [phone]        The results of significant diagnostics from this hospitalization (including imaging, microbiology, ancillary and laboratory) are listed below for reference.    Significant Diagnostic Studies: Dg Chest 2 View  03/01/2015  CLINICAL DATA:  Shortness of breath. EXAM: CHEST  2 VIEW COMPARISON:  01/26/2015 FINDINGS: The cardiomediastinal contours are unchanged, heart at the upper limits normal in size. Mild bibasilar atelectasis or scarring. Pulmonary vasculature is normal. No consolidation, pleural effusion, or pneumothorax. No acute osseous abnormalities are seen. IMPRESSION: Mild bibasilar atelectasis or scarring.  Otherwise no acute process. Electronically Signed   By: Jeb Levering M.D.   On: 03/01/2015 02:09   Dg Ang/ext/uni/or Right  02/07/2015  CLINICAL DATA:  Post right lower extremity arterial bypass graft EXAM: RIGHT ANG/EXT/UNI/ OR FLUOROSCOPY TIME:  Not provided COMPARISON:  None FINDINGS: A single spot angiographic image of the right lower leg is provided for review. Intra-arterial contrast has been injected from a more proximal location. Provided image demonstrates the sequela of below-knee presumed bypass graft placement. There is a minimal amount of slightly irregular poorly of contrast about the distal anastomosis which may represent a small area of extravasation. There is a single vessel runoff to right lower leg presumably via the peroneal artery though evaluation degraded due to obliquity. This vessel tapers at the level of the right lower leg. No discrete intraluminal filling defect to suggest distal embolism. Multiple surgical clips are noted about the medial and posterior aspect of the image lower thigh and upper leg with expected  scattered adjacent subcutaneous emphysema. No radiopaque foreign body. Post right total knee replacement, incompletely evaluated. IMPRESSION: Post below-knee bypass grafting as above. Electronically Signed   By: Sandi Mariscal M.D.   On: 02/07/2015 13:19   Dg Foot Complete Right  03/01/2015  CLINICAL DATA:  Status post great toe amputation with pain. EXAM: RIGHT FOOT COMPLETE - 3+ VIEW COMPARISON:  RIGHT foot radiograph January 16, 2015 FINDINGS: Status post interval amputation of first digit at proximal metatarsus. No destructive bony lesions. No acute fracture deformity. No dislocation. Mid and forefoot soft tissue swelling without subcutaneous gas or radiopaque foreign bodies. Moderate plantar calcaneal spur. Minimal vascular calcifications. IMPRESSION: Status post first proximal metatarsus amputation. Soft tissue swelling. Electronically Signed   By: Elon Alas M.D.   On: 03/01/2015 02:18    Microbiology: Recent Results (from the past 240 hour(s))  Blood culture (routine x 2)     Status: None (Preliminary result)   Collection Time: 03/01/15  3:53  AM  Result Value Ref Range Status   Specimen Description BLOOD RIGHT HAND  Final   Special Requests BOTTLES DRAWN AEROBIC AND ANAEROBIC 5ML  Final   Culture NO GROWTH 1 DAY  Final   Report Status PENDING  Incomplete  Blood culture (routine x 2)     Status: None (Preliminary result)   Collection Time: 03/01/15  3:58 AM  Result Value Ref Range Status   Specimen Description BLOOD LEFT HAND  Final   Special Requests BOTTLES DRAWN AEROBIC AND ANAEROBIC 5ML  Final   Culture NO GROWTH 1 DAY  Final   Report Status PENDING  Incomplete     Labs: Basic Metabolic Panel:  Recent Labs Lab 02/28/15 2131 03/01/15 0809 03/03/15 0552  NA 143 141 145  K 5.0 4.9 4.5  CL 105 111 113*  CO2 26 23 23   GLUCOSE 217* 122* 88  BUN 85* 83* 51*  CREATININE 3.46* 3.05* 1.87*  CALCIUM 8.9 8.3* 8.6*   Liver Function Tests:  Recent Labs Lab 02/28/15 2131  03/01/15 0809  AST 13* 13*  ALT 7* 6*  ALKPHOS 72 59  BILITOT 0.3 0.3  PROT 6.1* 5.5*  ALBUMIN 3.0* 2.6*   No results for input(s): LIPASE, AMYLASE in the last 168 hours. No results for input(s): AMMONIA in the last 168 hours. CBC:  Recent Labs Lab 02/28/15 2131 03/01/15 0150 03/01/15 0809  WBC 8.8  --  7.8  NEUTROABS 5.9  --  4.9  HGB 9.6* 8.8* 8.1*  HCT 31.0* 28.5* 24.8*  MCV 100.6*  --  100.8*  PLT 237  --  171   Cardiac Enzymes:  Recent Labs Lab 03/01/15 0358 03/01/15 0809  TROPONINI <0.03 <0.03   BNP: BNP (last 3 results) No results for input(s): BNP in the last 8760 hours.  ProBNP (last 3 results) No results for input(s): PROBNP in the last 8760 hours.  CBG:  Recent Labs Lab 03/02/15 0737 03/02/15 1113 03/02/15 1619 03/02/15 2056 03/03/15 0752  GLUCAP 84 203* 259* 214* 93     Signed:  Charlynne Cousins MD.  [facility] 03/03/2015, 10:14 AM

## 2015-03-06 LAB — CULTURE, BLOOD (ROUTINE X 2)
CULTURE: NO GROWTH
Culture: NO GROWTH

## 2015-03-07 ENCOUNTER — Encounter (HOSPITAL_BASED_OUTPATIENT_CLINIC_OR_DEPARTMENT_OTHER): Payer: Medicare Other | Attending: Internal Medicine

## 2015-03-07 DIAGNOSIS — E1140 Type 2 diabetes mellitus with diabetic neuropathy, unspecified: Secondary | ICD-10-CM | POA: Insufficient documentation

## 2015-03-07 DIAGNOSIS — L89322 Pressure ulcer of left buttock, stage 2: Secondary | ICD-10-CM | POA: Insufficient documentation

## 2015-03-07 DIAGNOSIS — T8789 Other complications of amputation stump: Secondary | ICD-10-CM | POA: Insufficient documentation

## 2015-03-07 DIAGNOSIS — M109 Gout, unspecified: Secondary | ICD-10-CM | POA: Insufficient documentation

## 2015-03-07 DIAGNOSIS — I10 Essential (primary) hypertension: Secondary | ICD-10-CM | POA: Diagnosis not present

## 2015-03-07 DIAGNOSIS — L89612 Pressure ulcer of right heel, stage 2: Secondary | ICD-10-CM | POA: Insufficient documentation

## 2015-03-07 DIAGNOSIS — E11621 Type 2 diabetes mellitus with foot ulcer: Secondary | ICD-10-CM | POA: Insufficient documentation

## 2015-03-07 DIAGNOSIS — L97512 Non-pressure chronic ulcer of other part of right foot with fat layer exposed: Secondary | ICD-10-CM | POA: Insufficient documentation

## 2015-03-07 DIAGNOSIS — Z89421 Acquired absence of other right toe(s): Secondary | ICD-10-CM | POA: Diagnosis not present

## 2015-03-07 DIAGNOSIS — I2510 Atherosclerotic heart disease of native coronary artery without angina pectoris: Secondary | ICD-10-CM | POA: Diagnosis not present

## 2015-03-07 DIAGNOSIS — Y835 Amputation of limb(s) as the cause of abnormal reaction of the patient, or of later complication, without mention of misadventure at the time of the procedure: Secondary | ICD-10-CM | POA: Insufficient documentation

## 2015-03-14 ENCOUNTER — Encounter (HOSPITAL_COMMUNITY): Payer: Medicare Other

## 2015-03-14 DIAGNOSIS — T8789 Other complications of amputation stump: Secondary | ICD-10-CM | POA: Diagnosis not present

## 2015-03-15 ENCOUNTER — Encounter: Payer: Self-pay | Admitting: Vascular Surgery

## 2015-03-15 ENCOUNTER — Ambulatory Visit (INDEPENDENT_AMBULATORY_CARE_PROVIDER_SITE_OTHER): Payer: Medicare Other | Admitting: Vascular Surgery

## 2015-03-15 VITALS — BP 120/67 | HR 85 | Temp 98.0°F | Resp 16 | Ht 73.0 in | Wt 191.0 lb

## 2015-03-15 DIAGNOSIS — I739 Peripheral vascular disease, unspecified: Secondary | ICD-10-CM

## 2015-03-15 NOTE — Progress Notes (Signed)
HISTORY AND PHYSICAL    History of Present Illness:    He was admitted to the hospital on 03/01/2015 for hypotension and we were called to re evaluate his right foot wound. This is a 75 year old gentleman who presented with an extensive wound of his right great toe involving the metatarsal head. He underwent a right femoral to tibial peroneal trunk bypass with a vein graft, endarterectomy of the tibial peroneal trunk with vein patch angioplasty. 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 He has had some bleeding issues with this. He has had some swelling in the right leg and when he was seen in the office on 02/22/2015, he had a venous duplex scan which showed no evidence of DVT. He had excellent Doppler signals in his right foot with a patent bypass graft.  * Have written for dressing changes with hydrogel which should prevent the dressing from sticking which might cause further bleeding.  He was discharged home with [facility] wound care.  He is here today for wound check.    Other medical problems include has Hypertensive heart disease; Lumbar disc disease; Hx of peptic ulcer; Hx of adenomatous colonic polyps; Bradycardia:  Normal condition for this patient. Previously worked up.; Tobacco abuse disorder; Spinal stenosis of lumbar region; Type 2 diabetes mellitus with peripheral neuropathy ([place]); Gout; Hyperlipidemia; GERD (gastroesophageal reflux disease); Peripheral vascular disease with claudication ([place]); Chronic kidney disease stage 3; Mass of lower lobe of left lung; CAD (coronary artery disease), native coronary artery; Ascending aortic aneurysm ([place]); Cellulitis; Acute renal failure superimposed on stage 3 chronic kidney disease (HCC); SIRS (systemic inflammatory response syndrome) ([place]); Acute on chronic kidney failure ([place]); Type 2 diabetes mellitus with right diabetic foot infection ([place]); Atherosclerosis of native arteries of the extremities with gangrene ([place]);  Extremity atherosclerosis with gangrene ([place]); Atherosclerosis of native arteries of extremities with gangrene, right leg ([place]); Hypotension; Renal failure (ARF), acute on chronic ([place]); Chronic anemia; and Type 2 diabetes mellitus with renal complication (HCC) on his problem list.  Past Medical History  Diagnosis Date  . Hypertension   . Ulcer disease   . Colon polyp   . Gout   . Bradycardia   . Hypercholesteremia   . PAD (peripheral artery disease) ([place])   . Chronic bronchitis ([place])   . Type II diabetes mellitus ([place])   . History of GI bleed 2007  . Gout   . Tobacco abuse disorder 03/10/2012  . GERD (gastroesophageal reflux disease)   . Pneumonia     denies  . ARF (acute renal failure) ([place]) 12/2014  . Neuropathy ([place])   . Arthritis   . Anemia     low iron    Past Surgical History  Procedure Laterality Date  . Splenectomy  ~ 1957  . Anterior cervical decomp/discectomy fusion  ~ 2007  . Upper endoscopy w/ sclerotherapy  ~ 2007  . Polypectomy    . Colonoscopy    . Iliac artery stent Left     left common/notes (02/04/2012)  . Hernia repair  ~ 2007    UHR (02/04/2012)  . Tonsillectomy  ~ 1947  . Lumbar laminectomy  11/14  . Total knee arthroplasty Right 02/03/2013    Procedure: TOTAL KNEE ARTHROPLASTY;  Surgeon: [name], MD;  Location: [place];  Service: Orthopedics;  Laterality: Right;  . Lower extremity angiogram N/A 02/04/2012    Procedure: LOWER EXTREMITY ANGIOGRAM;  Surgeon: Lorretta Harp, MD;  Location:  [place] CATH LAB;  Service: Cardiovascular;  Laterality: N/A;  . Percutaneous stent intervention Left 02/04/2012    Procedure: PERCUTANEOUS STENT INTERVENTION;  Surgeon: Lorretta Harp, MD;  [facility] C[facility] LAB;  Service: Cardiovascular;  Laterality: Left;  lt ext iliac stent  . Replacement total knee Right   . Back surgery    . Anterior lat lumbar fusion Left 01/19/2014    Procedure: LATERAL INTERBODY FUSION 1 LEVEL;  Surgeon: Sinclair Ship, MD;  Location: [place];  Service: Orthopedics;  Laterality: Left;  Left lumbar 3-4 lateral interbody fusion with instrumentation, allograft  . Peripheral vascular catheterization N/A 02/02/2015    Procedure: Abdominal Aortogram w/Lower Extremity;  Surgeon: Angelia [name], MD;  Location: [place] CV LAB;  Service: Cardiovascular;  Laterality: N/A;  . Eye surgery Bilateral     cataract surgery with lens implant  . Bypass graft femoral-peroneal Right 02/07/2015    Procedure: BYPASS GRAFT FEMORAL-PERONEAL Trunk WITH VEIN graft  right leg.;  Surgeon: Angelia [name], MD;  Location: [place];  Service: Vascular;  Laterality: Right;  . Amputation Right 02/07/2015    Procedure: GREAT TOE RAY AMPUTATION RIGHT ;  Surgeon: Angelia [name], MD;  Location: [place];  Service: Vascular;  Laterality: Right;  . Application of wound vac Right 02/07/2015    Procedure:  APPLICATION OF WOUND VAC right great toe amputation site.;  Surgeon: Angelia [name], MD;  Location: [place];  Service: Vascular;  Laterality: Right;  . Intraoperative arteriogram Right 02/07/2015    Procedure: INTRA OPERATIVE ARTERIOGRAM;  Surgeon: Angelia [name], MD;  Location: [facility] OR;  Service: Vascular;  Laterality: Right;  . Endarterectomy tibioperoneal Right 02/07/2015    Procedure: ENDARTERECTOMY TIBIOPERONEAL;  Surgeon: Angelia [name], MD;  Location: [facility] OR;  Service: Vascular;  Laterality: Right;  . Patch angioplasty Right 02/07/2015    Procedure: Vein PATCH ANGIOPLASTY to tibioperoneal trunk;  Surgeon: Angelia [name], MD;  Location: [place];  Service: Vascular;  Laterality: Right;    Social History Social History  Substance Use Topics  . Smoking status: Current Every Day Smoker -- 1.50 packs/day for 58 years    Types: Cigarettes  . Smokeless tobacco: Former Systems developer    Types: Chew     Comment: Pt states he's going to use the cigarettes he has today (02/07/15) and will quit  . Alcohol Use: Yes     Comment: \    Family  History Family History  Problem Relation Age of Onset  . Colon cancer Paternal Uncle     Uncle  . Heart disease Father   . Hypertension Father   . Heart attack Father   . Heart disease Mother   . Diabetes Mother   . Hypertension Mother     Allergies  No Known Allergies   Current Outpatient Prescriptions  Medication Sig Dispense Refill  . allopurinol (ZYLOPRIM) 300 MG tablet Take 300 mg by mouth 2 (two) times daily.     [name] aspirin EC 81 MG tablet Take 81 mg by mouth at bedtime.     [name] atorvastatin (LIPITOR) 10 MG tablet Take 10 mg by mouth at bedtime.     . clopidogrel (PLAVIX) 75 MG tablet Take 75 mg by mouth at bedtime.     . colchicine 0.6 MG tablet Take 1 tablet (0.6 mg total) by mouth daily. (Patient taking differently: Take 0.6 mg by mouth at bedtime. ) 10 tablet 0  . furosemide (LASIX) 80 MG tablet Take 1  tablet (80 mg total) by mouth daily.    [name] gabapentin (NEURONTIN) 600 MG tablet Take 1,200 mg by mouth 2 (two) times daily.     [name] glimepiride (AMARYL) 4 MG tablet Take 4 mg by mouth 2 (two) times daily.     [name] lisinopril (PRINIVIL,ZESTRIL) 20 MG tablet Take 1 tablet (20 mg total) by mouth daily. 30 tablet 0  . oxyCODONE-acetaminophen (PERCOCET/ROXICET) 5-325 MG tablet Take 1 tablet by mouth every 4 (four) hours as needed for severe pain. 30 tablet 0  . pantoprazole (PROTONIX) 40 MG tablet Take 1 tablet (40 mg total) by mouth daily. 30 tablet 0  . potassium gluconate 595 MG TABS tablet Take 595 mg by mouth at bedtime as needed (leg cramps).    . predniSONE (DELTASONE) 20 MG tablet Take 20 mg by mouth daily.     No current facility-administered medications for this visit.    ROS:   General:  No weight loss, Fever, chills  HEENT: No recent headaches, no nasal bleeding, no visual changes, no sore throat  Neurologic: No dizziness, blackouts, seizures. No recent symptoms of stroke or mini- stroke. No recent episodes of slurred speech, or temporary blindness.  Cardiac: No  recent episodes of chest pain/pressure, no shortness of breath at rest.  No shortness of breath with exertion.  Denies history of atrial fibrillation or irregular heartbeat  Vascular: No history of rest pain in feet.  No history of claudication.  No history of non-healing ulcer, No history of DVT   Pulmonary: No home oxygen, no productive cough, no hemoptysis,  No asthma or wheezing  Musculoskeletal:  [ ]  Arthritis, [ ]  Low back pain,  [ ]  Joint pain  Hematologic:No history of hypercoagulable state.  No history of easy bleeding.  No history of anemia  Gastrointestinal: No hematochezia or melena,  No gastroesophageal reflux, no trouble swallowing  Urinary: [ ]  chronic Kidney disease, [ ]  on HD - [ ]  MWF or [ ]  TTHS, [ ]  Burning with urination, [ ]  Frequent urination, [ ]  Difficulty urinating;   Skin: No rashes  Psychological: No history of anxiety,  No history of depression   Physical Examination  Filed Vitals:   03/15/15 1438  BP: 120/67  Pulse: 85  Temp: 98 F (36.7 C)  TempSrc: Oral  Resp: 16  Height: 6\' 1"  (1.854 m)  Weight: 191 lb (86.637 kg)  SpO2: 100%    Body mass index is 25.2 kg/(m^2).  General:  Alert and oriented, no acute distress HEENT: Normal Neck: No bruit or JVD Pulmonary: Clear to auscultation bilaterally Cardiac: Regular Rate and Rhythm without murmur Abdomen: Soft, non-tender, non-distended, no mass, no scars Skin: No rash, right foot open wound with yellow eschar and distal dark area/ bony prominence at second metatarsal head area.  Clear drainage and moderate to sever edema. Extremity Pulses: DP/PT doppler signals on the right LE Musculoskeletal: No deformity or edema  Neurologic: Upper and lower extremity motor 5/5 and symmetric     ASSESSMENT:  PAD S/P  PROCEDURE:  1. Right common femoral artery to tibial peroneal trunk artery bypass with non-reverse translocated right great saphenous vein 2. Intraoperative arteriogram 3. Endarterectomy of  tibial peroneal trunk with vein patch angioplasty  PLAN:  We will need to surgically debride the right foot wound to aide the healing of his foot.  He has a patent graft in the right LE with doppler signals.  We recommended that he lay down and elevate his right foot above  the level of his heart as much as possible.  He will have surgery for right foot debridement on 03/21/2015 by Dr. Scot Dock.  Theda Sers Pamelia Botto [facility] PA-C [facility] Office: [phone]  The patient was seen in conjunction with Dr. Scot Dock today.  Agree with above. I will plan to debride the wound X Tuesday. I have explained that there is a good chance I will also have to take the adjacent 2nd toe. We may or may not be able to place a negative pressure dressing. I have discussed the case with Dr.Britto and I will try to obtain deep space cultures and also bone culture if possible.  Deitra Mayo, MD, [place] [phone] Office: [phone]

## 2015-03-16 ENCOUNTER — Other Ambulatory Visit: Payer: Self-pay

## 2015-03-16 ENCOUNTER — Telehealth: Payer: Self-pay

## 2015-03-16 DIAGNOSIS — G8918 Other acute postprocedural pain: Secondary | ICD-10-CM

## 2015-03-16 MED ORDER — OXYCODONE-ACETAMINOPHEN 5-325 MG PO TABS: 1.0000 | ORAL_TABLET | ORAL | Status: DC | PRN

## 2015-03-16 NOTE — Telephone Encounter (Signed)
Pt. called to request refill on pain medication, for pain in right foot.  Reported he forgot to ask for a refill yesterday, when in office.  Discussed with Dr. Oneida Alar.  Recommended pt. could have refill of Percocet # 20, no refills.  Pt. notified of Rx available at front desk.  Verb. Understanding.

## 2015-03-17 ENCOUNTER — Encounter (HOSPITAL_COMMUNITY): Payer: Self-pay | Admitting: *Deleted

## 2015-03-17 NOTE — Progress Notes (Signed)
Mr Dommer reported that he saw his PCP for lab draws x 2 since discharge.  Techniacal problem with first draw, 2nd visit was 03/16/15, patient does not have results back.  I sent a request to Dr Arelia Sneddon office for the results.

## 2015-03-18 ENCOUNTER — Other Ambulatory Visit: Payer: Self-pay

## 2015-03-18 ENCOUNTER — Emergency Department (HOSPITAL_COMMUNITY): Payer: Medicare Other

## 2015-03-18 ENCOUNTER — Encounter (HOSPITAL_COMMUNITY): Payer: Self-pay | Admitting: Nurse Practitioner

## 2015-03-18 ENCOUNTER — Inpatient Hospital Stay (HOSPITAL_COMMUNITY)
Admission: EM | Admit: 2015-03-18 | Discharge: 2015-03-24 | DRG: 854 | Disposition: A | Discharge status: Home Health | Payer: Medicare Other | Attending: Internal Medicine | Admitting: Internal Medicine

## 2015-03-18 DIAGNOSIS — M109 Gout, unspecified: Secondary | ICD-10-CM | POA: Diagnosis present

## 2015-03-18 DIAGNOSIS — I959 Hypotension, unspecified: Secondary | ICD-10-CM | POA: Diagnosis present

## 2015-03-18 DIAGNOSIS — Z66 Do not resuscitate: Secondary | ICD-10-CM | POA: Diagnosis present

## 2015-03-18 DIAGNOSIS — Y92230 Patient room in hospital as the place of occurrence of the external cause: Secondary | ICD-10-CM | POA: Diagnosis not present

## 2015-03-18 DIAGNOSIS — M5136 Other intervertebral disc degeneration, lumbar region: Secondary | ICD-10-CM | POA: Diagnosis present

## 2015-03-18 DIAGNOSIS — E7800 Pure hypercholesterolemia, unspecified: Secondary | ICD-10-CM | POA: Diagnosis present

## 2015-03-18 DIAGNOSIS — Z7902 Long term (current) use of antithrombotics/antiplatelets: Secondary | ICD-10-CM

## 2015-03-18 DIAGNOSIS — D631 Anemia in chronic kidney disease: Secondary | ICD-10-CM | POA: Diagnosis present

## 2015-03-18 DIAGNOSIS — E1151 Type 2 diabetes mellitus with diabetic peripheral angiopathy without gangrene: Secondary | ICD-10-CM | POA: Diagnosis present

## 2015-03-18 DIAGNOSIS — L03115 Cellulitis of right lower limb: Secondary | ICD-10-CM | POA: Diagnosis present

## 2015-03-18 DIAGNOSIS — L89610 Pressure ulcer of right heel, unstageable: Secondary | ICD-10-CM | POA: Diagnosis present

## 2015-03-18 DIAGNOSIS — G8918 Other acute postprocedural pain: Secondary | ICD-10-CM

## 2015-03-18 DIAGNOSIS — K219 Gastro-esophageal reflux disease without esophagitis: Secondary | ICD-10-CM | POA: Diagnosis present

## 2015-03-18 DIAGNOSIS — T402X5A Adverse effect of other opioids, initial encounter: Secondary | ICD-10-CM | POA: Diagnosis not present

## 2015-03-18 DIAGNOSIS — Z800 Family history of malignant neoplasm of digestive organs: Secondary | ICD-10-CM

## 2015-03-18 DIAGNOSIS — K5903 Drug induced constipation: Secondary | ICD-10-CM | POA: Diagnosis not present

## 2015-03-18 DIAGNOSIS — T879 Unspecified complications of amputation stump: Secondary | ICD-10-CM | POA: Diagnosis not present

## 2015-03-18 DIAGNOSIS — M4800 Spinal stenosis, site unspecified: Secondary | ICD-10-CM | POA: Diagnosis present

## 2015-03-18 DIAGNOSIS — N183 Chronic kidney disease, stage 3 unspecified: Secondary | ICD-10-CM | POA: Diagnosis present

## 2015-03-18 DIAGNOSIS — E1142 Type 2 diabetes mellitus with diabetic polyneuropathy: Secondary | ICD-10-CM | POA: Diagnosis present

## 2015-03-18 DIAGNOSIS — E1122 Type 2 diabetes mellitus with diabetic chronic kidney disease: Secondary | ICD-10-CM | POA: Diagnosis present

## 2015-03-18 DIAGNOSIS — Z22322 Carrier or suspected carrier of Methicillin resistant Staphylococcus aureus: Secondary | ICD-10-CM

## 2015-03-18 DIAGNOSIS — Z833 Family history of diabetes mellitus: Secondary | ICD-10-CM | POA: Diagnosis not present

## 2015-03-18 DIAGNOSIS — L8990 Pressure ulcer of unspecified site, unspecified stage: Secondary | ICD-10-CM | POA: Insufficient documentation

## 2015-03-18 DIAGNOSIS — F17210 Nicotine dependence, cigarettes, uncomplicated: Secondary | ICD-10-CM | POA: Diagnosis present

## 2015-03-18 DIAGNOSIS — Z7952 Long term (current) use of systemic steroids: Secondary | ICD-10-CM

## 2015-03-18 DIAGNOSIS — Z96651 Presence of right artificial knee joint: Secondary | ICD-10-CM | POA: Diagnosis present

## 2015-03-18 DIAGNOSIS — M519 Unspecified thoracic, thoracolumbar and lumbosacral intervertebral disc disorder: Secondary | ICD-10-CM | POA: Diagnosis present

## 2015-03-18 DIAGNOSIS — I2510 Atherosclerotic heart disease of native coronary artery without angina pectoris: Secondary | ICD-10-CM | POA: Diagnosis present

## 2015-03-18 DIAGNOSIS — D649 Anemia, unspecified: Secondary | ICD-10-CM | POA: Diagnosis present

## 2015-03-18 DIAGNOSIS — Z7984 Long term (current) use of oral hypoglycemic drugs: Secondary | ICD-10-CM

## 2015-03-18 DIAGNOSIS — A419 Sepsis, unspecified organism: Principal | ICD-10-CM | POA: Diagnosis present

## 2015-03-18 DIAGNOSIS — R6520 Severe sepsis without septic shock: Secondary | ICD-10-CM | POA: Diagnosis present

## 2015-03-18 DIAGNOSIS — Z7982 Long term (current) use of aspirin: Secondary | ICD-10-CM | POA: Diagnosis not present

## 2015-03-18 DIAGNOSIS — I130 Hypertensive heart and chronic kidney disease with heart failure and stage 1 through stage 4 chronic kidney disease, or unspecified chronic kidney disease: Secondary | ICD-10-CM | POA: Diagnosis present

## 2015-03-18 DIAGNOSIS — I9589 Other hypotension: Secondary | ICD-10-CM | POA: Diagnosis not present

## 2015-03-18 DIAGNOSIS — Z8249 Family history of ischemic heart disease and other diseases of the circulatory system: Secondary | ICD-10-CM

## 2015-03-18 DIAGNOSIS — J449 Chronic obstructive pulmonary disease, unspecified: Secondary | ICD-10-CM | POA: Diagnosis present

## 2015-03-18 DIAGNOSIS — I5032 Chronic diastolic (congestive) heart failure: Secondary | ICD-10-CM | POA: Diagnosis present

## 2015-03-18 DIAGNOSIS — L0390 Cellulitis, unspecified: Secondary | ICD-10-CM | POA: Diagnosis present

## 2015-03-18 LAB — CBC WITH DIFFERENTIAL/PLATELET
Basophils Absolute: 0 10*3/uL (ref 0.0–0.1)
Basophils Relative: 0 %
EOS PCT: 0 %
Eosinophils Absolute: 0 10*3/uL (ref 0.0–0.7)
HEMATOCRIT: 28.3 % — AB (ref 39.0–52.0)
Hemoglobin: 8.9 g/dL — ABNORMAL LOW (ref 13.0–17.0)
LYMPHS ABS: 1 10*3/uL (ref 0.7–4.0)
LYMPHS PCT: 7 %
MCH: 31.1 pg (ref 26.0–34.0)
MCHC: 31.4 g/dL (ref 30.0–36.0)
MCV: 99 fL (ref 78.0–100.0)
MONO ABS: 0.2 10*3/uL (ref 0.1–1.0)
MONOS PCT: 2 %
NEUTROS ABS: 13.1 10*3/uL — AB (ref 1.7–7.7)
Neutrophils Relative %: 91 %
PLATELETS: 276 10*3/uL (ref 150–400)
RBC: 2.86 MIL/uL — ABNORMAL LOW (ref 4.22–5.81)
RDW: 16 % — AB (ref 11.5–15.5)
WBC: 14.3 10*3/uL — ABNORMAL HIGH (ref 4.0–10.5)

## 2015-03-18 LAB — COMPREHENSIVE METABOLIC PANEL
ALK PHOS: 68 U/L (ref 38–126)
ALT: 6 U/L — AB (ref 17–63)
AST: 15 U/L (ref 15–41)
Albumin: 3.1 g/dL — ABNORMAL LOW (ref 3.5–5.0)
Anion gap: 13 (ref 5–15)
BUN: 30 mg/dL — AB (ref 6–20)
CALCIUM: 9.2 mg/dL (ref 8.9–10.3)
CO2: 18 mmol/L — AB (ref 22–32)
CREATININE: 1.75 mg/dL — AB (ref 0.61–1.24)
Chloride: 107 mmol/L (ref 101–111)
GFR calc non Af Amer: 36 mL/min — ABNORMAL LOW (ref >60)
GFR, EST AFRICAN AMERICAN: 42 mL/min — AB (ref >60)
GLUCOSE: 309 mg/dL — AB (ref 65–99)
Potassium: 4.7 mmol/L (ref 3.5–5.1)
SODIUM: 138 mmol/L (ref 135–145)
Total Bilirubin: 0.2 mg/dL — ABNORMAL LOW (ref 0.3–1.2)
Total Protein: 6.6 g/dL (ref 6.5–8.1)

## 2015-03-18 LAB — URINE MICROSCOPIC-ADD ON

## 2015-03-18 LAB — URINALYSIS, ROUTINE W REFLEX MICROSCOPIC
Bilirubin Urine: NEGATIVE
GLUCOSE, UA: 500 mg/dL — AB
HGB URINE DIPSTICK: NEGATIVE
KETONES UR: NEGATIVE mg/dL
Leukocytes, UA: NEGATIVE
Nitrite: NEGATIVE
PH: 5.5 (ref 5.0–8.0)
PROTEIN: 30 mg/dL — AB
Specific Gravity, Urine: 1.018 (ref 1.005–1.030)

## 2015-03-18 LAB — I-STAT CG4 LACTIC ACID, ED: Lactic Acid, Venous: 2.26 mmol/L (ref 0.5–2.0)

## 2015-03-18 LAB — I-STAT TROPONIN, ED: Troponin i, poc: 0.01 ng/mL (ref 0.00–0.08)

## 2015-03-18 MED ORDER — SODIUM CHLORIDE 0.9 % IV SOLN
1000.0000 mL | Freq: Once | INTRAVENOUS | Status: AC
  Administered 2015-03-18: 1000 mL via INTRAVENOUS

## 2015-03-18 MED ORDER — VANCOMYCIN HCL 10 G IV SOLR
1250.0000 mg | Freq: Once | INTRAVENOUS | Status: AC
  Administered 2015-03-18: 1250 mg via INTRAVENOUS
  Filled 2015-03-18: qty 1250

## 2015-03-18 MED ORDER — MORPHINE SULFATE (PF) 4 MG/ML IV SOLN
6.0000 mg | Freq: Once | INTRAVENOUS | Status: AC
  Administered 2015-03-18: 6 mg via INTRAVENOUS
  Filled 2015-03-18: qty 2

## 2015-03-18 MED ORDER — SODIUM CHLORIDE 0.9 % IV SOLN
1000.0000 mL | INTRAVENOUS | Status: DC
  Administered 2015-03-18: 1000 mL via INTRAVENOUS

## 2015-03-18 MED ORDER — ACETAMINOPHEN 325 MG PO TABS
325.0000 mg | ORAL_TABLET | Freq: Once | ORAL | Status: AC
  Administered 2015-03-18: 325 mg via ORAL
  Filled 2015-03-18: qty 1

## 2015-03-18 MED ORDER — PIPERACILLIN-TAZOBACTAM 3.375 G IVPB 30 MIN
3.3750 g | Freq: Once | INTRAVENOUS | Status: AC
  Administered 2015-03-18: 3.375 g via INTRAVENOUS
  Filled 2015-03-18: qty 50

## 2015-03-18 MED ORDER — IBUPROFEN 200 MG PO TABS
600.0000 mg | ORAL_TABLET | Freq: Once | ORAL | Status: AC
  Administered 2015-03-18: 600 mg via ORAL
  Filled 2015-03-18: qty 1

## 2015-03-18 NOTE — ED Notes (Addendum)
Pt had a toe amputation in January that has not been healing and he is scheduled for surgery this coming week. Today he began to have elevated BP,fevers, lethargy at home. . Wife gave him 1000mg  tylenol around 1730 today for fever, he remains febrile now. He is A&O, breathing easily

## 2015-03-18 NOTE — ED Notes (Signed)
Due to scheduled upcoming surgery on Tuesday and recent acetaminophen administration, will hold additional medications at this time and continue to monitor the patient while waiting for exam room.

## 2015-03-18 NOTE — H&P (Signed)
History and Physical  Patient Name: Marc [name]     [id number]    DOB: Nov 17, 1940    DOA: 03/18/2015 Referring physician: Jola Schmidt, MD PCP: [name], MD      Chief Complaint: Leg pain and fever  HPI: Marc [name] is a 75 y.o. male with a past medical history significant for PVD s/p R great toe amp and recent R fem-pop bypass, HTN, DM, and CAD who presents with fever and leg pain.  Patient admitted in early January for right toe gangrene, had R great toe amputation and R fem-pop bypass at that time.  Two weeks ago, readmitted with hypotension, resolved with fluids, suspected to be medication induced not septic.  Now, since this morning, the patient has had redness and swelling and warmth up his right leg, as well as fever and malaise/sluggishness.  The fever did not respond to acetaminophen and the pain and redness persisted and so he came to the ER.  There had been no increase in discharge from the wound.  He denied fever, chest congestion, sore throat, dysuria, hematuria, abdominal pain.  In the ED, he was febrile to 102.25F, tachycardic, and hypotensive to 80/40. K4.7, creatinine 1.7 (at baseline), WBC 14.2, lactate 2.26. A chest x-ray was normal. Urinalysis showed rare bacteria.       Review of Systems:  All other systems negative except as just noted or noted in the history of present illness.  No Known Allergies  Prior to Admission medications   Medication Sig Start Date End Date Taking? Authorizing Provider  acetaminophen (TYLENOL) 500 MG tablet Take 1,000 mg by mouth every 6 (six) hours as needed for fever.   Yes Historical Provider, MD  allopurinol (ZYLOPRIM) 300 MG tablet Take 300 mg by mouth 2 (two) times daily.    Yes Historical Provider, MD  aspirin EC 81 MG tablet Take 81 mg by mouth at bedtime.    Yes Historical Provider, MD  atorvastatin (LIPITOR) 10 MG tablet Take 10 mg by mouth at bedtime.  11/23/14  Yes Historical Provider, MD  colchicine  0.6 MG tablet Take 1 tablet (0.6 mg total) by mouth daily. Patient taking differently: Take 0.6 mg by mouth at bedtime.  01/21/15  Yes Reyne Dumas, MD  collagenase (SANTYL) ointment Apply 1 application topically every other day.   Yes Historical Provider, MD  furosemide (LASIX) 80 MG tablet Take 1 tablet (80 mg total) by mouth daily. 03/08/15  Yes Charlynne Cousins, MD  gabapentin (NEURONTIN) 600 MG tablet Take 1,200 mg by mouth 2 (two) times daily.    Yes Historical Provider, MD  glimepiride (AMARYL) 4 MG tablet Take 4 mg by mouth 2 (two) times daily.    Yes Historical Provider, MD  lisinopril (PRINIVIL,ZESTRIL) 20 MG tablet Take 1 tablet (20 mg total) by mouth daily. 03/07/15  Yes Charlynne Cousins, MD  oxyCODONE-acetaminophen (PERCOCET/ROXICET) 5-325 MG tablet Take 1 tablet by mouth every 4 (four) hours as needed for severe pain. 03/16/15  Yes Elam Dutch, MD  pantoprazole (PROTONIX) 40 MG tablet Take 1 tablet (40 mg total) by mouth daily. 01/21/15  Yes Reyne Dumas, MD  potassium gluconate 595 MG TABS tablet Take 595 mg by mouth at bedtime.    Yes Historical Provider, MD  predniSONE (DELTASONE) 10 MG tablet Take 20 mg by mouth daily with breakfast.   Yes Historical Provider, MD  clopidogrel (PLAVIX) 75 MG tablet Take 75 mg by mouth at bedtime.  11/22/13   Historical Provider,  MD    Past Medical History  Diagnosis Date  . Hypertension   . Ulcer disease   . Colon polyp   . Gout   . Bradycardia   . Hypercholesteremia   . PAD (peripheral artery disease) ([place])   . Chronic bronchitis ([place])   . Type II diabetes mellitus ([place])   . History of GI bleed 2007  . Gout   . Tobacco abuse disorder 03/10/2012  . GERD (gastroesophageal reflux disease)   . Pneumonia     denies  . ARF (acute renal failure) ([place]) 12/2014  . Neuropathy ([place])   . Arthritis   . Anemia     low iron  . Depression     occassional.  Situational  . History of blood transfusion     Past Surgical History    Procedure Laterality Date  . Splenectomy  ~ 1957  . Anterior cervical decomp/discectomy fusion  ~ 2007  . Upper endoscopy w/ sclerotherapy  ~ 2007  . Polypectomy    . Colonoscopy    . Iliac artery stent Left     left common/notes (02/04/2012)  . Hernia repair  ~ 2007    UHR (02/04/2012)  . Tonsillectomy  ~ 1947  . Lumbar laminectomy  11/14  . Total knee arthroplasty Right 02/03/2013    Procedure: TOTAL KNEE ARTHROPLASTY;  Surgeon: [name], MD;  Location: [place];  Service: Orthopedics;  Laterality: Right;  . Lower extremity angiogram N/A 02/04/2012    Procedure: LOWER EXTREMITY ANGIOGRAM;  Surgeon: Lorretta Harp, MD;  Location: [facility] CATH LAB;  Service: Cardiovascular;  Laterality: N/A;  . Percutaneous stent intervention Left 02/04/2012    Procedure: PERCUTANEOUS STENT INTERVENTION;  Surgeon: Lorretta Harp, MD;  Location[facility] C[facility] LAB;  Service: Cardiovascular;  Laterality: Left;  lt ext iliac stent  . Replacement total knee Right   . Back surgery    . Anterior lat lumbar fusion Left 01/19/2014    Procedure: LATERAL INTERBODY FUSION 1 LEVEL;  Surgeon: Sinclair Ship, MD;  Location: [place];  Service: Orthopedics;  Laterality: Left;  Left lumbar 3-4 lateral interbody fusion with instrumentation, allograft  . Peripheral vascular catheterization N/A 02/02/2015    Procedure: Abdominal Aortogram w/Lower Extremity;  Surgeon: Angelia [name], MD;  Location: [place] CV LAB;  Service: Cardiovascular;  Laterality: N/A;  . Eye surgery Bilateral     cataract surgery with lens implant  . Bypass graft femoral-peroneal Right 02/07/2015    Procedure: BYPASS GRAFT FEMORAL-PERONEAL Trunk WITH VEIN graft  right leg.;  Surgeon: Angelia [name], MD;  Location: [place];  Service: Vascular;  Laterality: Right;  . Amputation Right 02/07/2015    Procedure: GREAT TOE RAY AMPUTATION RIGHT ;  Surgeon: Angelia [name], MD;  Location: [place];  Service: Vascular;  Laterality: Right;  . Application  of wound vac Right 02/07/2015    Procedure:  APPLICATION OF WOUND VAC right great toe amputation site.;  Surgeon: Angelia [name], MD;  Location: [place];  Service: Vascular;  Laterality: Right;  . Intraoperative arteriogram Right 02/07/2015    Procedure: INTRA OPERATIVE ARTERIOGRAM;  Surgeon: Angelia [name], MD;  Location[facility] OR;  Service: Vascular;  Laterality: Right;  . Endarterectomy tibioperoneal Right 02/07/2015    Procedure: ENDARTERECTOMY TIBIOPERONEAL;  Surgeon: Angelia [name], MD;  Location: [facility] OR;  Service: Vascular;  Laterality: Right;  . Patch angioplasty Right 02/07/2015    Procedure: Vein PATCH ANGIOPLASTY to tibioperoneal trunk;  Surgeon: Judeth Cornfield  Scot Dock, MD;  Location: [facility] OR;  Service: Vascular;  Laterality: Right;    Family history: family history includes Colon cancer in his paternal uncle; Diabetes in his mother; Heart attack in his father; Heart disease in his father and mother; Hypertension in his father and mother.  Social History: Patient lives by himself.  He smokes.  He is able to ambulate with a cane.       Physical Exam: BP 88/52 mmHg  Pulse 60  Temp(Src) 102.8 F (39.3 C) (Oral)  Resp 15  SpO2 99% General appearance: Well-developed, adult male, sleeping but rousable, tired and in no acute distress.   Eyes: Anicteric, conjunctiva pink, lids and lashes normal.     ENT: No nasal deformity, discharge, or epistaxis.  Oral mucosa dry.   Lymph: No cervical or supraclavicular lymphadenopathy. Skin: Warm and flushed.  The right lower extremity is warm and swollen to the knee, with redness and edema compared to the left.  There is no purulent drainage or odor. Cardiac: RRR, nl S1-S2, no murmurs appreciated.  Capillary refill is brisk.  JVP not visible.  Radial pulses 2+ and symmetric.  DP pulses not palpable to me. Respiratory: Normal respiratory rate and rhythm.  CTAB without rales or wheezes. Abdomen: Abdomen soft without rigidity.  No TTP. No  ascites, distension.   MSK: No deformities or effusions. Neuro: sleepy and sluggish but oriented to situation, time and place.  Responding to questions appropriately.  Speech is fluent.  Moves all extremities equally and with normal coordination and strength.  Cranial nerves grossly normal.    Psych: Behavior appropriate.  Affect normal.  No evidence of aural or visual hallucinations or delusions.       Labs on Admission:  The metabolic panel shows normal sodium, potassium, slightly low bicarbonate. The renal function is at baseline, the BUN/creatinine ratio is elevated. Albumin is low. Transaminases and bilirubin are normal. Lactic acid level is 2.26. The troponin is normal. The urinalysis is essentially normal.. The complete blood count shows leukocytosis, anemia with increased RDW, unchanged from last month, and normal platelets.   Radiological Exams on Admission: Personally reviewed: Dg Chest 2 View 03/18/2015 IMPRESSION: Mild bibasilar scarring versus atelectasis. Otherwise no active disease in the chest.    Dg Foot Complete Right 03/18/2015 Soft tissue swelling.  Great toe amputation   EKG: Independently reviewed. Sinus rhythm, no ST-T wave changes, rate 88, QTC normal.  ABIs of the right lower extremity recently show patent bypass graft.    Assessment/Plan 1. Sepsis from LLE cellulitis:  This is new.  Suspected source cellulitis, flu doubted. Organism unknown. Patient meets criteria given tachycardia, tachypnea, fever, leukocytosis, and evidence of organ dysfunction (elevated lactate and hypotension).  Blood and urine cultures drawn.  Lactate exceeds 2 mmol/L and repeat ordered within 6 hours.  MAP > 65 mmHg at present.   -Piperacillin-tazobactam and van, renally dosed -30 ml/kg bolus given in ED, will repeat lactic acid -Follow blood and urine culture data -Admit to Stepdown -Vital signs every one hour for the first 4 hours -Acetaminophen for fever   2.  Hypotension:  This is new.  The patient takes daily prednisone (see below).  Hypotension from sepsis, resolved, or possibly some secondary adrenal insufficiency.   -Check AM cortisol -If hypotension recurs, will stop prednisone and start HC 50 q8  3. Chronic diastolic CHF:  No recent echocardiogram. -Hold furosemide, no evidence of exacerbation  4. HTN:  Hypotensive at admission -Hold lisinopril and furosemide for now  5. IDDM:  -Continue gabapentin -Sliding scale corrections -Hold glimepiride  6. Gout:  -Continue daily colchicine -Reduce allopurinol to 100 daily  7. Low back pain, spinal stenosis:  The patient reports being on prednisone 20 mg daily for his low back pain.  Consideration should be given to tapering off this medication. -Continue prednisone 20 mg daily for low back pain for now  8. Anemia, unspecified type: Suspect chronic disease. -Check B12, folate, iron stores      DVT PPx: SCDs Diet: NPO per VVS Consultants: Vascular Code Status: NO NOT RESUSCITATE Family Communication: None present  Medical decision making: What exists of the patient's previous chart was reviewed in depth and the case was discussed with Dr. Venora Maples. Patient seen 11:35 PM on 03/18/2015.  Disposition Plan:  I recommend admission to step down given hypotension in the ER initially, which was fluid responsive, but in the context of his chronic comorbidities makes him high risk overnight for hemodynamic compromise.  Anticipate antibiotic treatment overnight, evaluation by Vascular surgery tomorrow.      Edwin Dada [facility] Pager [phone]

## 2015-03-18 NOTE — ED Provider Notes (Signed)
CSN: [id number]     Arrival date & time 03/18/15  1831 History   First MD Initiated Contact with Patient 03/18/15 1903     Chief Complaint  Patient presents with  . Wound Infection      HPI Patient is a 75 year old gentleman who underwent right femoral to tibial peroneal trunk bypass with a vein graft, endarterectomy of the tibial peroneal trunk with vein patch angioplasty as well as ray amputation of the right great toe and placement of a VAC on 02/07/2015.  He presents emergency department today because of increasing redness and swelling of his right lower extremity and fever to as high as 103 at home.  They called the doctor's recommend that he come to the emergency department.  He is scheduled for debridement of his right foot and likely amputation of his second toe by Dr. Doren Custard of vascular surgery on 03/21/2015.  He denies chest pain or shortness breath.  Denies cough change from baseline.  Denies abdominal pain.  Reports some nausea without vomiting or diarrhea.  Wife reports that the redness and swelling of his right lower extremity worsened over the past 24 hours.  Family is concerned about the possibility of worsening infection in the right foot.    Past Medical History  Diagnosis Date  . Hypertension   . Ulcer disease   . Colon polyp   . Gout   . Bradycardia   . Hypercholesteremia   . PAD (peripheral artery disease) ([place])   . Chronic bronchitis ([place])   . Type II diabetes mellitus ([place])   . History of GI bleed 2007  . Gout   . Tobacco abuse disorder 03/10/2012  . GERD (gastroesophageal reflux disease)   . Pneumonia     denies  . ARF (acute renal failure) ([place]) 12/2014  . Neuropathy ([place])   . Arthritis   . Anemia     low iron  . Depression     occassional.  Situational  . History of blood transfusion    Past Surgical History  Procedure Laterality Date  . Splenectomy  ~ 1957  . Anterior cervical decomp/discectomy fusion  ~ 2007  . Upper endoscopy w/ sclerotherapy  ~  2007  . Polypectomy    . Colonoscopy    . Iliac artery stent Left     left common/notes (02/04/2012)  . Hernia repair  ~ 2007    UHR (02/04/2012)  . Tonsillectomy  ~ 1947  . Lumbar laminectomy  11/14  . Total knee arthroplasty Right 02/03/2013    Procedure: TOTAL KNEE ARTHROPLASTY;  Surgeon: [name], MD;  Location: [place];  Service: Orthopedics;  Laterality: Right;  . Lower extremity angiogram N/A 02/04/2012    Procedure: LOWER EXTREMITY ANGIOGRAM;  Surgeon: Lorretta Harp, MD;  Location: [facility] CATH LAB;  Service: Cardiovascular;  Laterality: N/A;  . Percutaneous stent intervention Left 02/04/2012    Procedure: PERCUTANEOUS STENT INTERVENTION;  Surgeon: Lorretta Harp, MD;  Location: [facility] CATH LAB;  Service: Cardiovascular;  Laterality: Left;  lt ext iliac stent  . Replacement total knee Right   . Back surgery    . Anterior lat lumbar fusion Left 01/19/2014    Procedure: LATERAL INTERBODY FUSION 1 LEVEL;  Surgeon: Sinclair Ship, MD;  Location: [place];  Service: Orthopedics;  Laterality: Left;  Left lumbar 3-4 lateral interbody fusion with instrumentation, allograft  . Peripheral vascular catheterization N/A 02/02/2015    Procedure: Abdominal Aortogram w/Lower Extremity;  Surgeon: Angelia [name], MD;  Location: [place] CV LAB;  Service: Cardiovascular;  Laterality: N/A;  . Eye surgery Bilateral     cataract surgery with lens implant  . Bypass graft femoral-peroneal Right 02/07/2015    Procedure: BYPASS GRAFT FEMORAL-PERONEAL Trunk WITH VEIN graft  right leg.;  Surgeon: Angelia [name], MD;  Location: [place];  Service: Vascular;  Laterality: Right;  . Amputation Right 02/07/2015    Procedure: GREAT TOE RAY AMPUTATION RIGHT ;  Surgeon: Angelia [name], MD;  Location: [place];  Service: Vascular;  Laterality: Right;  . Application of wound vac Right 02/07/2015    Procedure:  APPLICATION OF WOUND VAC right great toe amputation site.;  Surgeon: Angelia [name], MD;   Location: [place];  Service: Vascular;  Laterality: Right;  . Intraoperative arteriogram Right 02/07/2015    Procedure: INTRA OPERATIVE ARTERIOGRAM;  Surgeon: Angelia [name], MD;  Location: [place];  Service: Vascular;  Laterality: Right;  . Endarterectomy tibioperoneal Right 02/07/2015    Procedure: ENDARTERECTOMY TIBIOPERONEAL;  Surgeon: Angelia [name], MD;  Location: [facility] OR;  Service: Vascular;  Laterality: Right;  . Patch angioplasty Right 02/07/2015    Procedure: Vein PATCH ANGIOPLASTY to tibioperoneal trunk;  Surgeon: Angelia [name], MD;  Location: [facility] OR;  Service: Vascular;  Laterality: Right;   Family History  Problem Relation Age of Onset  . Colon cancer Paternal Uncle     Uncle  . Heart disease Father   . Hypertension Father   . Heart attack Father   . Heart disease Mother   . Diabetes Mother   . Hypertension Mother    Social History  Substance Use Topics  . Smoking status: Current Every Day Smoker -- 1.50 packs/day for 58 years    Types: Cigarettes  . Smokeless tobacco: Former Systems developer    Types: Chew     Comment: Pt states he's going to use the cigarettes he has today (02/07/15) and will quit  . Alcohol Use: No    Review of Systems  All other systems reviewed and are negative.     Allergies  Review of patient's allergies indicates no known allergies.  Home Medications   Prior to Admission medications   Medication Sig Start Date End Date Taking? Authorizing Provider  acetaminophen (TYLENOL) 500 MG tablet Take 1,000 mg by mouth every 6 (six) hours as needed for fever.   Yes Historical Provider, MD  allopurinol (ZYLOPRIM) 300 MG tablet Take 300 mg by mouth 2 (two) times daily.    Yes Historical Provider, MD  aspirin EC 81 MG tablet Take 81 mg by mouth at bedtime.    Yes Historical Provider, MD  atorvastatin (LIPITOR) 10 MG tablet Take 10 mg by mouth at bedtime.  11/23/14  Yes Historical Provider, MD  colchicine 0.6 MG tablet Take 1 tablet (0.6 mg total)  by mouth daily. Patient taking differently: Take 0.6 mg by mouth at bedtime.  01/21/15  Yes Reyne Dumas, MD  collagenase (SANTYL) ointment Apply 1 application topically every other day.   Yes Historical Provider, MD  furosemide (LASIX) 80 MG tablet Take 1 tablet (80 mg total) by mouth daily. 03/08/15  Yes Charlynne Cousins, MD  gabapentin (NEURONTIN) 600 MG tablet Take 1,200 mg by mouth 2 (two) times daily.    Yes Historical Provider, MD  glimepiride (AMARYL) 4 MG tablet Take 4 mg by mouth 2 (two) times daily.    Yes Historical Provider, MD  lisinopril (PRINIVIL,ZESTRIL) 20 MG tablet Take 1 tablet (20 mg total)  by mouth daily. 03/07/15  Yes Charlynne Cousins, MD  oxyCODONE-acetaminophen (PERCOCET/ROXICET) 5-325 MG tablet Take 1 tablet by mouth every 4 (four) hours as needed for severe pain. 03/16/15  Yes Elam Dutch, MD  pantoprazole (PROTONIX) 40 MG tablet Take 1 tablet (40 mg total) by mouth daily. 01/21/15  Yes Reyne Dumas, MD  potassium gluconate 595 MG TABS tablet Take 595 mg by mouth at bedtime.    Yes Historical Provider, MD  predniSONE (DELTASONE) 10 MG tablet Take 20 mg by mouth daily with breakfast.   Yes Historical Provider, MD  clopidogrel (PLAVIX) 75 MG tablet Take 75 mg by mouth at bedtime.  11/22/13   Historical Provider, MD   BP 123/66 mmHg  Pulse 93  Temp(Src) 102.9 F (39.4 C) (Oral)  Resp 19  SpO2 100% Physical Exam  ED Course  Procedures (including critical care time)  CRITICAL CARE Performed by: Hoy Morn Total critical care time: 35 minutes Critical care time was exclusive of separately billable procedures and treating other patients. Critical care was necessary to treat or prevent imminent or life-threatening deterioration. Critical care was time spent personally by me on the following activities: development of treatment plan with patient and/or surrogate as well as nursing, discussions with consultants, evaluation of patient's response to treatment,  examination of patient, obtaining history from patient or surrogate, ordering and performing treatments and interventions, ordering and review of laboratory studies, ordering and review of radiographic studies, pulse oximetry and re-evaluation of patient's condition.    Labs Review Labs Reviewed  COMPREHENSIVE METABOLIC PANEL - Abnormal; Notable for the following:    CO2 18 (*)    Glucose, Bld 309 (*)    BUN 30 (*)    Creatinine, Ser 1.75 (*)    Albumin 3.1 (*)    ALT 6 (*)    Total Bilirubin 0.2 (*)    GFR calc non Af Amer 36 (*)    GFR calc Af Amer 42 (*)    All other components within normal limits  CBC WITH DIFFERENTIAL/PLATELET - Abnormal; Notable for the following:    WBC 14.3 (*)    RBC 2.86 (*)    Hemoglobin 8.9 (*)    HCT 28.3 (*)    RDW 16.0 (*)    Neutro Abs 13.1 (*)    All other components within normal limits  URINALYSIS, ROUTINE W REFLEX MICROSCOPIC (NOT AT [facility]) - Abnormal; Notable for the following:    APPearance CLOUDY (*)    Glucose, UA 500 (*)    Protein, ur 30 (*)    All other components within normal limits  URINE MICROSCOPIC-ADD ON - Abnormal; Notable for the following:    Squamous Epithelial / LPF 0-5 (*)    Bacteria, UA RARE (*)    All other components within normal limits  I-STAT CG4 LACTIC ACID, ED - Abnormal; Notable for the following:    Lactic Acid, Venous 2.26 (*)    All other components within normal limits  URINE CULTURE  CULTURE, BLOOD (ROUTINE X 2)  CULTURE, BLOOD (ROUTINE X 2)  I-STAT TROPOININ, ED  I-STAT CG4 LACTIC ACID, ED  I-STAT CG4 LACTIC ACID, ED    Imaging Review Dg Chest 2 View  03/18/2015  CLINICAL DATA:  Sepsis, fever, cough. EXAM: CHEST  2 VIEW COMPARISON:  03/01/2015 chest radiograph. FINDINGS: Stable cardiomediastinal silhouette with normal heart size. No pneumothorax. No pleural effusion. Mild scarring versus atelectasis at both lung bases. No pulmonary edema or acute consolidative airspace disease. IMPRESSION:  Mild  bibasilar scarring versus atelectasis. Otherwise no active disease in the chest. Electronically Signed   By: Ilona Sorrel M.D.   On: 03/18/2015 20:43   Dg Foot Complete Right  03/18/2015  CLINICAL DATA:  75 year old male with toe amputation in January. Patient presenting with fever and right lower extremity swelling. EXAM: RIGHT FOOT COMPLETE - 3+ VIEW COMPARISON:  Radiograph dated 03/01/2015 FINDINGS: There is stable appearing proximal transmetatarsal amputation of the first digit. There is no acute fracture or dislocation. No erosive changes or evidence of periosteal reaction. The bones are osteopenic. There are degenerative changes of the hindfoot and ankle joint. There is diffuse soft tissue swelling of the midfoot and forefoot. No soft tissue gas or radiopaque foreign object identified. Dressing noted over the foot. IMPRESSION: Stable appearing amputation of the proximal aspect of the first metatarsal. No acute osseous pathology. Diffuse soft tissue swelling of the foot. Electronically Signed   By: Anner Crete M.D.   On: 03/18/2015 20:47   I have personally reviewed and evaluated these images and lab results as part of my medical decision-making.   EKG Interpretation   Date/Time:  Saturday March 18 2015 19:33:47 EST Ventricular Rate:  88 PR Interval:  199 QRS Duration: 113 QT Interval:  347 QTC Calculation: 420 R Axis:   -67 Text Interpretation:  Sinus rhythm Incomplete left bundle branch block  Inferior infarct, acute (RCA) Anterior Q waves, possibly due to ILBBB  Probable RV involvement, suggest recording right precordial leads No  significant change was found Confirmed by Editha Bridgeforth  MD, Lennette Bihari ([id number]) on  03/18/2015 7:39:44 PM      MDM   Final diagnoses:  Cellulitis of right lower extremity  Sepsis, due to unspecified organism ([place])    Pt with fever, sepsis and RLE cellulitis. vanc and zosyn. Mild hypotension responding to IVFs at this time. Lactate elevated. Spoke with Dr  Bridgett Larsson who requests NPO after midnight and will see in the am. Will need stepdown admission at this time. Pt and family updated.     Jola Schmidt, MD 03/18/15 2249

## 2015-03-19 DIAGNOSIS — M4806 Spinal stenosis, lumbar region: Secondary | ICD-10-CM

## 2015-03-19 DIAGNOSIS — A419 Sepsis, unspecified organism: Secondary | ICD-10-CM | POA: Diagnosis present

## 2015-03-19 DIAGNOSIS — L03115 Cellulitis of right lower limb: Secondary | ICD-10-CM | POA: Diagnosis present

## 2015-03-19 DIAGNOSIS — I5032 Chronic diastolic (congestive) heart failure: Secondary | ICD-10-CM | POA: Diagnosis present

## 2015-03-19 LAB — COMPREHENSIVE METABOLIC PANEL
ALK PHOS: 55 U/L (ref 38–126)
ALT: 7 U/L — AB (ref 17–63)
AST: 16 U/L (ref 15–41)
Albumin: 2.5 g/dL — ABNORMAL LOW (ref 3.5–5.0)
Anion gap: 7 (ref 5–15)
BUN: 26 mg/dL — AB (ref 6–20)
CHLORIDE: 115 mmol/L — AB (ref 101–111)
CO2: 20 mmol/L — ABNORMAL LOW (ref 22–32)
Calcium: 8.1 mg/dL — ABNORMAL LOW (ref 8.9–10.3)
Creatinine, Ser: 1.58 mg/dL — ABNORMAL HIGH (ref 0.61–1.24)
GFR calc Af Amer: 48 mL/min — ABNORMAL LOW (ref >60)
GFR, EST NON AFRICAN AMERICAN: 41 mL/min — AB (ref >60)
Glucose, Bld: 92 mg/dL (ref 65–99)
POTASSIUM: 4.8 mmol/L (ref 3.5–5.1)
Sodium: 142 mmol/L (ref 135–145)
Total Bilirubin: 0.4 mg/dL (ref 0.3–1.2)
Total Protein: 5.6 g/dL — ABNORMAL LOW (ref 6.5–8.1)

## 2015-03-19 LAB — CBC
HCT: 26.1 % — ABNORMAL LOW (ref 39.0–52.0)
HEMOGLOBIN: 8 g/dL — AB (ref 13.0–17.0)
MCH: 30.9 pg (ref 26.0–34.0)
MCHC: 30.7 g/dL (ref 30.0–36.0)
MCV: 100.8 fL — AB (ref 78.0–100.0)
PLATELETS: 224 10*3/uL (ref 150–400)
RBC: 2.59 MIL/uL — AB (ref 4.22–5.81)
RDW: 16.5 % — ABNORMAL HIGH (ref 11.5–15.5)
WBC: 13.5 10*3/uL — AB (ref 4.0–10.5)

## 2015-03-19 LAB — CBG MONITORING, ED
GLUCOSE-CAPILLARY: 89 mg/dL (ref 65–99)
GLUCOSE-CAPILLARY: 68 mg/dL (ref 65–99)
GLUCOSE-CAPILLARY: 76 mg/dL (ref 65–99)
GLUCOSE-CAPILLARY: 112 mg/dL — AB (ref 65–99)
GLUCOSE-CAPILLARY: 147 mg/dL — AB (ref 65–99)

## 2015-03-19 LAB — MRSA PCR SCREENING: MRSA BY PCR: POSITIVE — AB

## 2015-03-19 LAB — GLUCOSE, CAPILLARY
Glucose-Capillary: 235 mg/dL — ABNORMAL HIGH (ref 65–99)
Glucose-Capillary: 304 mg/dL — ABNORMAL HIGH (ref 65–99)

## 2015-03-19 LAB — VITAMIN B12: VITAMIN B 12: 436 pg/mL (ref 180–914)

## 2015-03-19 LAB — LACTIC ACID, PLASMA
Lactic Acid, Venous: 1.5 mmol/L (ref 0.5–2.0)
Lactic Acid, Venous: 1.5 mmol/L (ref 0.5–2.0)

## 2015-03-19 LAB — FERRITIN: Ferritin: 176 ng/mL (ref 24–336)

## 2015-03-19 LAB — PREALBUMIN: Prealbumin: 22.7 mg/dL (ref 18–38)

## 2015-03-19 LAB — I-STAT CG4 LACTIC ACID, ED: LACTIC ACID, VENOUS: 1.31 mmol/L (ref 0.5–2.0)

## 2015-03-19 LAB — CORTISOL-AM, BLOOD: CORTISOL - AM: 16.7 ug/dL (ref 6.7–22.6)

## 2015-03-19 MED ORDER — PREDNISONE 20 MG PO TABS
20.0000 mg | ORAL_TABLET | Freq: Every day | ORAL | Status: DC
  Administered 2015-03-19 – 2015-03-21 (×3): 20 mg via ORAL
  Filled 2015-03-19 (×3): qty 1

## 2015-03-19 MED ORDER — PANTOPRAZOLE SODIUM 40 MG PO TBEC
40.0000 mg | DELAYED_RELEASE_TABLET | Freq: Every day | ORAL | Status: DC
  Administered 2015-03-19 – 2015-03-24 (×6): 40 mg via ORAL
  Filled 2015-03-19 (×6): qty 1

## 2015-03-19 MED ORDER — ACETAMINOPHEN 325 MG PO TABS
650.0000 mg | ORAL_TABLET | Freq: Four times a day (QID) | ORAL | Status: DC | PRN
  Administered 2015-03-19: 650 mg via ORAL
  Filled 2015-03-19: qty 2

## 2015-03-19 MED ORDER — SODIUM CHLORIDE 0.9 % IV SOLN
INTRAVENOUS | Status: DC
Start: 2015-03-19 — End: 2015-03-19
  Administered 2015-03-19: 06:00:00 via INTRAVENOUS

## 2015-03-19 MED ORDER — SODIUM CHLORIDE 0.9% FLUSH
3.0000 mL | Freq: Two times a day (BID) | INTRAVENOUS | Status: DC
  Administered 2015-03-19 – 2015-03-23 (×7): 3 mL via INTRAVENOUS

## 2015-03-19 MED ORDER — DEXTROSE-NACL 5-0.9 % IV SOLN
INTRAVENOUS | Status: DC
  Administered 2015-03-19 (×2): via INTRAVENOUS

## 2015-03-19 MED ORDER — ATORVASTATIN CALCIUM 10 MG PO TABS
10.0000 mg | ORAL_TABLET | Freq: Every day | ORAL | Status: DC
  Administered 2015-03-19 – 2015-03-23 (×5): 10 mg via ORAL
  Filled 2015-03-19 (×5): qty 1

## 2015-03-19 MED ORDER — MUPIROCIN 2 % EX OINT
1.0000 "application " | TOPICAL_OINTMENT | Freq: Two times a day (BID) | CUTANEOUS | Status: AC
  Administered 2015-03-19 – 2015-03-24 (×10): 1 via NASAL
  Filled 2015-03-19 (×3): qty 22

## 2015-03-19 MED ORDER — OXYCODONE-ACETAMINOPHEN 5-325 MG PO TABS
1.0000 | ORAL_TABLET | ORAL | Status: DC | PRN
  Administered 2015-03-19 – 2015-03-23 (×9): 1 via ORAL
  Filled 2015-03-19 (×9): qty 1

## 2015-03-19 MED ORDER — ACETAMINOPHEN 650 MG RE SUPP: 650.0000 mg | Freq: Four times a day (QID) | RECTAL | Status: DC | PRN

## 2015-03-19 MED ORDER — SODIUM CHLORIDE 0.9 % IV SOLN
INTRAVENOUS | Status: DC
  Administered 2015-03-19: 23:00:00 via INTRAVENOUS

## 2015-03-19 MED ORDER — SODIUM CHLORIDE 0.9 % IV SOLN: Freq: Once | INTRAVENOUS | Status: DC

## 2015-03-19 MED ORDER — PIPERACILLIN-TAZOBACTAM 3.375 G IVPB
3.3750 g | Freq: Three times a day (TID) | INTRAVENOUS | Status: DC
  Administered 2015-03-19 – 2015-03-24 (×15): 3.375 g via INTRAVENOUS
  Filled 2015-03-19 (×20): qty 50

## 2015-03-19 MED ORDER — GABAPENTIN 300 MG PO CAPS
1200.0000 mg | ORAL_CAPSULE | Freq: Two times a day (BID) | ORAL | Status: DC
  Administered 2015-03-19 – 2015-03-24 (×11): 1200 mg via ORAL
  Filled 2015-03-19 (×7): qty 4
  Filled 2015-03-19: qty 12
  Filled 2015-03-19 (×3): qty 4

## 2015-03-19 MED ORDER — ALLOPURINOL 100 MG PO TABS
100.0000 mg | ORAL_TABLET | Freq: Every day | ORAL | Status: DC
  Administered 2015-03-19 – 2015-03-24 (×6): 100 mg via ORAL
  Filled 2015-03-19 (×6): qty 1

## 2015-03-19 MED ORDER — MORPHINE SULFATE (PF) 2 MG/ML IV SOLN
2.0000 mg | INTRAVENOUS | Status: DC | PRN
  Administered 2015-03-21 – 2015-03-23 (×2): 2 mg via INTRAVENOUS
  Filled 2015-03-19 (×2): qty 1

## 2015-03-19 MED ORDER — CHLORHEXIDINE GLUCONATE CLOTH 2 % EX PADS
6.0000 | MEDICATED_PAD | Freq: Every day | CUTANEOUS | Status: AC
  Administered 2015-03-20 – 2015-03-24 (×5): 6 via TOPICAL

## 2015-03-19 MED ORDER — VANCOMYCIN HCL IN DEXTROSE 1-5 GM/200ML-% IV SOLN
1000.0000 mg | INTRAVENOUS | Status: DC
  Administered 2015-03-19 – 2015-03-23 (×5): 1000 mg via INTRAVENOUS
  Filled 2015-03-19 (×7): qty 200

## 2015-03-19 MED ORDER — HEPARIN SODIUM (PORCINE) 5000 UNIT/ML IJ SOLN
5000.0000 [IU] | Freq: Three times a day (TID) | INTRAMUSCULAR | Status: DC
  Administered 2015-03-19 – 2015-03-24 (×13): 5000 [IU] via SUBCUTANEOUS
  Filled 2015-03-19 (×13): qty 1

## 2015-03-19 MED ORDER — COLCHICINE 0.6 MG PO TABS
0.6000 mg | ORAL_TABLET | Freq: Every day | ORAL | Status: DC
  Administered 2015-03-19 – 2015-03-23 (×5): 0.6 mg via ORAL
  Filled 2015-03-19 (×5): qty 1

## 2015-03-19 MED ORDER — INSULIN ASPART 100 UNIT/ML ~~LOC~~ SOLN
0.0000 [IU] | SUBCUTANEOUS | Status: DC
  Administered 2015-03-19: 3 [IU] via SUBCUTANEOUS
  Administered 2015-03-19: 1 [IU] via SUBCUTANEOUS
  Administered 2015-03-20: 5 [IU] via SUBCUTANEOUS
  Administered 2015-03-20: 2 [IU] via SUBCUTANEOUS
  Administered 2015-03-20: 7 [IU] via SUBCUTANEOUS

## 2015-03-19 NOTE — Progress Notes (Signed)
Pharmacy Antibiotic Note  Marc [name] is a 75 y.o. male admitted on 03/18/2015 with sepsis and cellulitis.  Pharmacy has been consulted for Vancomycin/Zosyn dosing.  Plan: -Vancomycin 1000 mg IV q24h -Zosyn 3.375G IV q8h to be infused over 4 hours -Trend WBC, temp, renal function -Drug levels as indicated   Temp (24hrs), Avg:102.9 F (39.4 C), Min:102.8 F (39.3 C), Max:102.9 F (39.4 C)   Recent Labs Lab 03/18/15 1855 03/18/15 1925 03/19/15 0053  WBC 14.3*  --   --   CREATININE 1.75*  --   --   LATICACIDVEN  --  2.26* 1.31    Estimated Creatinine Clearance: 41.2 mL/min (by C-G formula based on Cr of 1.75).    No Known Allergies  Narda Bonds 03/19/2015 5:09 AM

## 2015-03-19 NOTE — Progress Notes (Signed)
   Daily Progress Note  Assessment/Planning: s/p R CFA to TPT bypass, EA TPT w/ VPA, R 1st great toe by Dr. Scot Dock, reportedly sepsis with ascending cellulitis   Don't seem any signs of ascending cellulitis in the R leg currently, so need for guillotine amputation  R foot looks clean with out obvious pus or fluctuance so I don't think the patient needs to go to OR immediate for foot exploration  Dr. Scot Dock will check on the patient tomorrow  Pt being admitted to Hospitalist service for IV abx  Subjective    Sleepy, not too clear when his foot got worse  Objective Filed Vitals:   03/19/15 0445 03/19/15 0500 03/19/15 0515 03/19/15 0700  BP: 114/53 120/55 117/48 131/58  Pulse: 67 70 62 74  Temp:      TempSrc:      Resp: 19 18 19 22   SpO2: 97% 97% 98% 96%    Intake/Output Summary (Last 24 hours) at 03/19/15 0730 Last data filed at 03/19/15 0533  Gross per 24 hour  Intake      0 ml  Output    500 ml  Net   -500 ml    VASC  Clean amputation wound bed with devasc fat, slightly  exposed metatarsal, no fluctuance, no pus on probing, no obvious ascending erythema   Laboratory CBC    Component Value Date/Time   WBC 13.5* 03/19/2015 0545   HGB 8.0* 03/19/2015 0545   HCT 26.1* 03/19/2015 0545   PLT 224 03/19/2015 0545    BMET    Component Value Date/Time   NA 142 03/19/2015 0545   K 4.8 03/19/2015 0545   CL 115* 03/19/2015 0545   CO2 20* 03/19/2015 0545   GLUCOSE 92 03/19/2015 0545   BUN 26* 03/19/2015 0545   CREATININE 1.58* 03/19/2015 0545   CALCIUM 8.1* 03/19/2015 0545   GFRNONAA 41* 03/19/2015 0545   GFRAA 48* 03/19/2015 0545    Adele Barthel, MD [facility]: [phone] Pager: [phone]  03/19/2015, 7:30 AM

## 2015-03-19 NOTE — Progress Notes (Signed)
[facility] PROGRESS NOTE  Marc [name] [id number] DOB: 10-07-1940 DOA: 03/18/2015 PCP: [name], MD Admit HPI / Brief Narrative: Marc [name] is a 75 y.o.WM PMHx Depression, HTN, Bradycardia, CAD native artery PAD, PVD S/P Rt great toe amp and recent Rt fem-pop bypass, DM Type 2  Who presents with fever and leg pain.  Patient admitted in early January for right toe gangrene, had R great toe amputation and R fem-pop bypass at that time. Two weeks ago, readmitted with hypotension, resolved with fluids, suspected to be medication induced not septic.  Now, since this morning, the patient has had redness and swelling and warmth up his right leg, as well as fever and malaise/sluggishness. The fever did not respond to acetaminophen and the pain and redness persisted and so he came to the ER. There had been no increase in discharge from the wound. He denied fever, chest congestion, sore throat, dysuria, hematuria, abdominal pain.  In the ED, he was febrile to 102.18F, tachycardic, and hypotensive to 80/40. K4.7, creatinine 1.7 (at baseline), WBC 14.2, lactate 2.26. A chest x-ray was normal. Urinalysis showed rare bacteria.    HPI/Subjective: 2/19 A/O 4, NAD    Assessment/Plan: Sepsis unspecified organism/ RLE cellulitis:  -Suspected source cellulitis, flu doubted. Organism unknown. Patient meets criteria given tachycardia, tachypnea, fever, leukocytosis, and evidence of organ dysfunction (elevated lactate and hypotension)..  -Piperacillin-tazobactam and Vancomycin, renally dosed -30 ml/kg bolus given in ED, will repeat lactic acid; repeat lactic acid WNL -Acetaminophen for fever -Per vascular surgery no need for emergent/urgent surgery. Conservative management for now -Wound care of feet per vascular surgery/WOC  Hypotension:  -Resolving with hydration though still soft  -Continue normal saline 125 ml/hr -Adrenal insufficiency? Takes chronic  prednisone for back pain. -Check AM cortisol -If hypotension recurs, will stop prednisone and start HC 50 q8  Chronic diastolic CHF:  -No recent echocardiogram. Echocardiogram pending -Hold furosemide -Strict in and out -Daily weight -Transfuse for hemoglobin<8  HTN:  -Patient currently borderline hypotensive  -Hold all BP medication   IDDM:  -Continue gabapentin 1200 mg BID -Continue sensitive SSI  -Hold glimepiride  Gout:  -Continue daily colchicine -Reduce allopurinol to 100 daily -Uric acid pending  Low back pain, spinal stenosis:  -Chronically on Prednisone 20 mg daily for his low back pain.  -Recommend patient taper this medication off as outpatient   Anemia, unspecified type: -Suspect chronic disease. -Check B12, folate, iron stores   Code Status: DO NOT RESUSCITATE Family Communication: None Disposition Plan: Resolution sepsis   Consultants: Dr.Brian Starlyn Skeans vascular surgery   Procedures: 2/18 x-ray right foot;Stable appearing amputation of the proximal aspect of the first metatarsal. No acute osseous pathology   Cultures 2/18 blood left AC/right wrist NGTD 2/18 urine pending 2/19 MRSA by PCR positive   Antibiotics: Zosyn 2/18>> Vancomycin 2/18>>   DVT prophylaxis Subcutaneous heparin    Objective: Filed Vitals:   03/19/15 1745 03/19/15 1800 03/19/15 1900 03/19/15 2000  BP: 77/63 114/62 112/62 124/63  Pulse: 62 55 51 59  Temp:      TempSrc:      Resp:  15 14 18   Height:      Weight:      SpO2: 100% 100% 97% 99%    Intake/Output Summary (Last 24 hours) at 03/19/15 2021 Last data filed at 03/19/15 1900  Gross per 24 hour  Intake 1189.58 ml  Output    950 ml  Net 239.58 ml   Filed Weights   03/19/15 1700  Weight: 90.8 kg (200 lb 2.8 oz)     Exam: General: A/O 4, NAD, No acute respiratory distress Eyes: Negative headache, double vision,negative scleral hemorrhage ENT: Negative Runny nose, negative gingival  bleeding, Neck:  Negative scars, masses, torticollis, lymphadenopathy, JVD Lungs: Clear to auscultation bilaterally without wheezes or crackles Cardiovascular: Regular rate and rhythm without murmur gallop or rub normal S1 and S2 Abdomen:negative abdominal pain, negative dysphagia, nondistended, positive soft, bowel sounds, no rebound, no ascites, no appreciable mass Extremities: No significant cyanosis, clubbing. Right foot covered and clean bandage over surgical site first metatarsal, negative discharge. Right heel ulceration covered and clean, negative discharge Psychiatric:  Negative depression, negative anxiety, negative fatigue, negative mania  Neurologic:  Cranial nerves II through XII intact, tongue/uvula midline, all extremities muscle strength 5/5, sensation intact throughout, negative dysarthria, negative expressive aphasia, negative receptive aphasia.     Data Reviewed: Basic Metabolic Panel:  Recent Labs Lab 03/18/15 1855 03/19/15 0545  NA 138 142  K 4.7 4.8  CL 107 115*  CO2 18* 20*  GLUCOSE 309* 92  BUN 30* 26*  CREATININE 1.75* 1.58*  CALCIUM 9.2 8.1*   Liver Function Tests:  Recent Labs Lab 03/18/15 1855 03/19/15 0545  AST 15 16  ALT 6* 7*  ALKPHOS 68 55  BILITOT 0.2* 0.4  PROT 6.6 5.6*  ALBUMIN 3.1* 2.5*   No results for input(s): LIPASE, AMYLASE in the last 168 hours. No results for input(s): AMMONIA in the last 168 hours. CBC:  Recent Labs Lab 03/18/15 1855 03/19/15 0545  WBC 14.3* 13.5*  NEUTROABS 13.1*  --   HGB 8.9* 8.0*  HCT 28.3* 26.1*  MCV 99.0 100.8*  PLT 276 224   Cardiac Enzymes: No results for input(s): CKTOTAL, CKMB, CKMBINDEX, TROPONINI in the last 168 hours. BNP (last 3 results) No results for input(s): BNP in the last 8760 hours.  ProBNP (last 3 results) No results for input(s): PROBNP in the last 8760 hours.  CBG:  Recent Labs Lab 03/19/15 1005 03/19/15 1044 03/19/15 1235 03/19/15 1543 03/19/15 1953  GLUCAP 68  76 112* 147* 235*    Recent Results (from the past 240 hour(s))  Blood culture (routine x 2)     Status: None (Preliminary result)   Collection Time: 03/18/15  7:30 PM  Result Value Ref Range Status   Specimen Description BLOOD LEFT ANTECUBITAL  Final   Special Requests BOTTLES DRAWN AEROBIC AND ANAEROBIC 5CC  Final   Culture NO GROWTH < 24 HOURS  Final   Report Status PENDING  Incomplete  Blood culture (routine x 2)     Status: None (Preliminary result)   Collection Time: 03/18/15  7:40 PM  Result Value Ref Range Status   Specimen Description BLOOD RIGHT WRIST  Final   Special Requests BOTTLES DRAWN AEROBIC AND ANAEROBIC 5CC  Final   Culture NO GROWTH < 24 HOURS  Final   Report Status PENDING  Incomplete  MRSA PCR Screening     Status: Abnormal   Collection Time: 03/19/15  6:01 PM  Result Value Ref Range Status   MRSA by PCR POSITIVE (A) NEGATIVE Final    Comment:        The GeneXpert MRSA Assay (FDA approved for NASAL specimens only), is one component of a comprehensive MRSA colonization surveillance program. It is not intended to diagnose MRSA infection nor to guide or monitor treatment for MRSA infections. RESULT CALLED TO, READ BACK BY AND VERIFIED WITH: SNOW,S RN 03/19/15 1938 Marc [name]  Studies: Dg Chest 2 View  03/18/2015  CLINICAL DATA:  Sepsis, fever, cough. EXAM: CHEST  2 VIEW COMPARISON:  03/01/2015 chest radiograph. FINDINGS: Stable cardiomediastinal silhouette with normal heart size. No pneumothorax. No pleural effusion. Mild scarring versus atelectasis at both lung bases. No pulmonary edema or acute consolidative airspace disease. IMPRESSION: Mild bibasilar scarring versus atelectasis. Otherwise no active disease in the chest. Electronically Signed   By: Ilona Sorrel M.D.   On: 03/18/2015 20:43   Dg Foot Complete Right  03/18/2015  CLINICAL DATA:  75 year old male with toe amputation in January. Patient presenting with fever and right lower extremity  swelling. EXAM: RIGHT FOOT COMPLETE - 3+ VIEW COMPARISON:  Radiograph dated 03/01/2015 FINDINGS: There is stable appearing proximal transmetatarsal amputation of the first digit. There is no acute fracture or dislocation. No erosive changes or evidence of periosteal reaction. The bones are osteopenic. There are degenerative changes of the hindfoot and ankle joint. There is diffuse soft tissue swelling of the midfoot and forefoot. No soft tissue gas or radiopaque foreign object identified. Dressing noted over the foot. IMPRESSION: Stable appearing amputation of the proximal aspect of the first metatarsal. No acute osseous pathology. Diffuse soft tissue swelling of the foot. Electronically Signed   By: Anner Crete M.D.   On: 03/18/2015 20:47    Scheduled Meds: . sodium chloride   Intravenous Once  . allopurinol  100 mg Oral Daily  . atorvastatin  10 mg Oral QHS  . [START ON 03/20/2015] Chlorhexidine Gluconate Cloth  6 each Topical Q0600  . colchicine  0.6 mg Oral QHS  . gabapentin  1,200 mg Oral BID  . insulin aspart  0-9 Units Subcutaneous 6 times per day  . mupirocin ointment  1 application Nasal BID  . pantoprazole  40 mg Oral Daily  . piperacillin-tazobactam (ZOSYN)  IV  3.375 g Intravenous 3 times per day  . predniSONE  20 mg Oral Q breakfast  . sodium chloride flush  3 mL Intravenous Q12H  . vancomycin  1,000 mg Intravenous Q24H   Continuous Infusions: . sodium chloride      Principal Problem:   Sepsis ([place]) Active Problems:   Lumbar disc disease   Type 2 diabetes mellitus with peripheral neuropathy (HCC)   Chronic kidney disease stage 3   Cellulitis   Hypotension   Sepsis, unspecified organism (HCC)   Cellulitis of right lower extremity   Chronic diastolic CHF (congestive heart failure) ([place])    Time spent: 40 minutes    Marc [name], [facility] Pager [phone]. If 7PM-7AM, please contact night-coverage at [email or web address], password [facility] 03/19/2015, 8:21 PM   LOS: 1 day    Care during the described time interval was provided by me .  I have reviewed this patient's available data, including medical history, events of note, physical examination, and all test results as part of my evaluation. I have personally reviewed and interpreted all radiology studies.   Dia Crawford, MD [phone] Pager

## 2015-03-19 NOTE — ED Notes (Signed)
Admitting at bedside 

## 2015-03-19 NOTE — ED Notes (Signed)
Pt given a urinal.

## 2015-03-19 NOTE — ED Notes (Signed)
Paged admitting physician with noted wheezing and fever

## 2015-03-19 NOTE — ED Notes (Signed)
Transporting pt to [place] via stretcher w/telemetry.

## 2015-03-19 NOTE — ED Notes (Signed)
Spouse leaving at this time.  

## 2015-03-19 NOTE — ED Notes (Signed)
CBG was 89

## 2015-03-19 NOTE — ED Notes (Signed)
CBG 112.  

## 2015-03-20 ENCOUNTER — Telehealth: Payer: Self-pay | Admitting: *Deleted

## 2015-03-20 DIAGNOSIS — L8990 Pressure ulcer of unspecified site, unspecified stage: Secondary | ICD-10-CM | POA: Insufficient documentation

## 2015-03-20 LAB — CBC WITH DIFFERENTIAL/PLATELET
Basophils Absolute: 0 10*3/uL (ref 0.0–0.1)
Basophils Relative: 0 %
EOS PCT: 1 %
Eosinophils Absolute: 0.1 10*3/uL (ref 0.0–0.7)
HCT: 22 % — ABNORMAL LOW (ref 39.0–52.0)
HEMOGLOBIN: 7.1 g/dL — AB (ref 13.0–17.0)
LYMPHS PCT: 11 %
Lymphs Abs: 0.9 10*3/uL (ref 0.7–4.0)
MCH: 32.6 pg (ref 26.0–34.0)
MCHC: 32.3 g/dL (ref 30.0–36.0)
MCV: 100.9 fL — AB (ref 78.0–100.0)
MONO ABS: 0.3 10*3/uL (ref 0.1–1.0)
Monocytes Relative: 3 %
NEUTROS PCT: 85 %
Neutro Abs: 7.1 10*3/uL (ref 1.7–7.7)
PLATELETS: 196 10*3/uL (ref 150–400)
RBC: 2.18 MIL/uL — AB (ref 4.22–5.81)
RDW: 16.6 % — ABNORMAL HIGH (ref 11.5–15.5)
WBC: 8.4 10*3/uL (ref 4.0–10.5)

## 2015-03-20 LAB — GLUCOSE, CAPILLARY
GLUCOSE-CAPILLARY: 102 mg/dL — AB (ref 65–99)
Glucose-Capillary: 162 mg/dL — ABNORMAL HIGH (ref 65–99)
Glucose-Capillary: 236 mg/dL — ABNORMAL HIGH (ref 65–99)
Glucose-Capillary: 257 mg/dL — ABNORMAL HIGH (ref 65–99)
Glucose-Capillary: 276 mg/dL — ABNORMAL HIGH (ref 65–99)
Glucose-Capillary: 239 mg/dL — ABNORMAL HIGH (ref 65–99)

## 2015-03-20 LAB — COMPREHENSIVE METABOLIC PANEL
ALT: 7 U/L — AB (ref 17–63)
ANION GAP: 11 (ref 5–15)
AST: 12 U/L — ABNORMAL LOW (ref 15–41)
Albumin: 2.2 g/dL — ABNORMAL LOW (ref 3.5–5.0)
Alkaline Phosphatase: 46 U/L (ref 38–126)
BUN: 29 mg/dL — ABNORMAL HIGH (ref 6–20)
CHLORIDE: 113 mmol/L — AB (ref 101–111)
CO2: 18 mmol/L — AB (ref 22–32)
CREATININE: 1.71 mg/dL — AB (ref 0.61–1.24)
Calcium: 8.3 mg/dL — ABNORMAL LOW (ref 8.9–10.3)
GFR, EST AFRICAN AMERICAN: 43 mL/min — AB (ref >60)
GFR, EST NON AFRICAN AMERICAN: 37 mL/min — AB (ref >60)
Glucose, Bld: 165 mg/dL — ABNORMAL HIGH (ref 65–99)
POTASSIUM: 4.6 mmol/L (ref 3.5–5.1)
SODIUM: 142 mmol/L (ref 135–145)
Total Bilirubin: 0.3 mg/dL (ref 0.3–1.2)
Total Protein: 5.3 g/dL — ABNORMAL LOW (ref 6.5–8.1)

## 2015-03-20 LAB — HEMOGLOBIN A1C
Hgb A1c MFr Bld: 7.6 % — ABNORMAL HIGH (ref 4.8–5.6)
Mean Plasma Glucose: 171 mg/dL

## 2015-03-20 LAB — URIC ACID: URIC ACID, SERUM: 3 mg/dL — AB (ref 4.4–7.6)

## 2015-03-20 LAB — URINE CULTURE: Culture: NO GROWTH

## 2015-03-20 LAB — MAGNESIUM: MAGNESIUM: 1.9 mg/dL (ref 1.7–2.4)

## 2015-03-20 LAB — FOLATE RBC
Folate, Hemolysate: 380.8 ng/mL
Folate, RBC: 1620 ng/mL (ref >498)
HEMATOCRIT: 23.5 % — AB (ref 37.5–51.0)

## 2015-03-20 LAB — PREPARE RBC (CROSSMATCH)

## 2015-03-20 MED ORDER — DEXTROSE 5 % IV SOLN
1.5000 g | INTRAVENOUS | Status: AC
  Administered 2015-03-21: 1.5 g via INTRAVENOUS

## 2015-03-20 MED ORDER — SODIUM CHLORIDE 0.9 % IV SOLN
INTRAVENOUS | Status: DC
  Administered 2015-03-21: 12:00:00 via INTRAVENOUS

## 2015-03-20 MED ORDER — BISACODYL 10 MG RE SUPP
10.0000 mg | Freq: Once | RECTAL | Status: DC
  Filled 2015-03-20: qty 1

## 2015-03-20 MED ORDER — INSULIN ASPART 100 UNIT/ML ~~LOC~~ SOLN
0.0000 [IU] | Freq: Three times a day (TID) | SUBCUTANEOUS | Status: DC
  Administered 2015-03-20 – 2015-03-21 (×2): 8 [IU] via SUBCUTANEOUS
  Administered 2015-03-21: 1 [IU] via SUBCUTANEOUS
  Administered 2015-03-22: 8 [IU] via SUBCUTANEOUS
  Administered 2015-03-22: 3 [IU] via SUBCUTANEOUS
  Administered 2015-03-22: 8 [IU] via SUBCUTANEOUS
  Administered 2015-03-23: 11 [IU] via SUBCUTANEOUS
  Administered 2015-03-23: 3 [IU] via SUBCUTANEOUS
  Administered 2015-03-23: 8 [IU] via SUBCUTANEOUS
  Administered 2015-03-24: 2 [IU] via SUBCUTANEOUS

## 2015-03-20 MED ORDER — DOCUSATE SODIUM 100 MG PO CAPS
100.0000 mg | ORAL_CAPSULE | Freq: Two times a day (BID) | ORAL | Status: DC
  Administered 2015-03-20 – 2015-03-24 (×9): 100 mg via ORAL
  Filled 2015-03-20 (×9): qty 1

## 2015-03-20 MED ORDER — CHLORHEXIDINE GLUCONATE CLOTH 2 % EX PADS: 6.0000 | MEDICATED_PAD | Freq: Once | CUTANEOUS | Status: DC

## 2015-03-20 MED ORDER — COLLAGENASE 250 UNIT/GM EX OINT
TOPICAL_OINTMENT | Freq: Every day | CUTANEOUS | Status: DC
  Administered 2015-03-20: 1 via TOPICAL
  Administered 2015-03-21 – 2015-03-22 (×2): via TOPICAL
  Filled 2015-03-20: qty 30

## 2015-03-20 MED ORDER — INSULIN ASPART 100 UNIT/ML ~~LOC~~ SOLN
0.0000 [IU] | Freq: Every day | SUBCUTANEOUS | Status: DC
  Administered 2015-03-20: 2 [IU] via SUBCUTANEOUS
  Administered 2015-03-21: 5 [IU] via SUBCUTANEOUS
  Administered 2015-03-23: 2 [IU] via SUBCUTANEOUS

## 2015-03-20 MED ORDER — SENNA 8.6 MG PO TABS
2.0000 | ORAL_TABLET | Freq: Every day | ORAL | Status: DC
  Administered 2015-03-20 – 2015-03-23 (×4): 17.2 mg via ORAL
  Filled 2015-03-20 (×4): qty 2

## 2015-03-20 MED ORDER — SODIUM CHLORIDE 0.9 % IV SOLN
Freq: Once | INTRAVENOUS | Status: AC
  Administered 2015-03-20: 09:00:00 via INTRAVENOUS

## 2015-03-20 MED ORDER — POLYETHYLENE GLYCOL 3350 17 G PO PACK
17.0000 g | PACK | Freq: Every day | ORAL | Status: DC
  Administered 2015-03-20 – 2015-03-23 (×3): 17 g via ORAL
  Filled 2015-03-20 (×3): qty 1

## 2015-03-20 NOTE — Progress Notes (Signed)
[facility] PROGRESS NOTE  Marc [name] [id number] DOB: 01/26/1941 DOA: 03/18/2015 PCP: Marc Downing, MD  Brief Summary  Marc [name] is a 75 y.o.WM PMHx Depression, HTN, Bradycardia, CAD native artery PAD, PVD S/P Rt great toe amp and recent Rt fem-pop bypass, DM Type 2 who presents with fever and leg pain.  Patient admitted in early January for right toe gangrene, had R great toe amputation and R fem-pop bypass at that time. Two weeks ago, readmitted with hypotension, resolved with fluids, suspected to be medication-induced not septic.  On the morning of admission, the patient developed redness and swelling and warmth of his right leg, as well as fever and malaise/sluggishness. The fever did not respond to acetaminophen and the pain and redness persisted and so he came to the ER. In the ED, he was febrile to 102.61F, tachycardic, and hypotensive to 80/40. K4.7, creatinine 1.7 (at baseline), WBC 14.2, lactate 2.26. A chest x-ray was normal. Urinalysis showed rare bacteria.    Assessment/Plan  Sepsis unspecified organism but likely due to RLE cellulitis, tachycardia, tachypnea, fever, leukocytosis, and evidence of organ dysfunction (elevated lactate and hypotension). - Continue Piperacillin-tazobactam and Vancomycin, renally dosed -  Acetaminophen for fever -  Wound care of feet per vascular surgery/WOC -  Will likely need long course of antibiotics because he may have seeded his recent bypass.   Hypotension, improving -d/c normal saline - cortisol 16.7 -continue prednisone  Chronic diastolic CHF:  -No recent echocardiogram. Echocardiogram pending -continue to hold furosemide -Strict in and out -Daily weight  HTN:  -Patient currently borderline hypotensive  -Hold all BP medication   IDDM:  -Continue gabapentin 1200 mg BID -Continue sensitive SSI  -Hold glimepiride  Gout:  -Continue daily colchicine -Reduce allopurinol to 100 daily -Uric  acid pending  Low back pain, spinal stenosis:  -Chronically on Prednisone 20 mg daily for his low back pain.  -Recommend patient taper this medication off as outpatient   Acute on chronic anemia, no obvious blood loss, ferritin, b12, and TSH wnl -Suspect chronic disease. -Check B12, folate, iron stores -  Transfuse 1 unit PRBC and repeat H&H  Chronic kidney disease stage 3, baseline creatinine 1.5 due to diabetes -  Minimize nephrotoxins and renally dose medications, creatinine near baseline  Leukocytosis, resolved with antibiotics  Constipation, likely narcotic induced, start Colace, senna, MiraLAX with a bisacodyl suppository today  Diet:  Diabetic/healthy heart Access:  PIV IVF:  Off Proph:  Heparin  Code Status: DO NOT RESUSCITATE Family Communication: Patient alone Disposition Plan: Pending improvement in right lower extremity cellulitis, blood pressure   Consultants:  Vascular surgery  Procedures:  Chest x-ray  Right foot x-ray  Cultures 2/18 blood left AC/right wrist NGTD 2/18 urine neg 2/19 MRSA by PCR positive   Antibiotics: Zosyn 2/18>> Vancomycin 2/18>>  HPI/Subjective:  Continues to have pain and swelling of his right lower extremity. Ongoing chills but these are less frequent. Denies lightheadedness, shortness of breath, chest pain, nausea, vomiting, diarrhea. He has not had a bowel movement in several days.  Objective: Filed Vitals:   03/20/15 0920 03/20/15 1000 03/20/15 1100 03/20/15 1200  BP: 100/77 108/54 126/52 103/59  Pulse: 58 51 55 66  Temp: 98.3 F (36.8 C)   98 F (36.7 C)  TempSrc: Axillary   Oral  Resp: 16 12 17 17   Height:      Weight:      SpO2: 99% 97% 97% 98%    Intake/Output Summary (Last 24 hours) at  03/20/15 1400 Last data filed at 03/20/15 1300  Gross per 24 hour  Intake 4589.66 ml  Output   1400 ml  Net 3189.66 ml   Filed Weights   03/19/15 1700 03/20/15 0500  Weight: 90.8 kg (200 lb 2.8 oz) 92.3 kg (203  lb 7.8 oz)   Body mass index is 26.85 kg/(m^2).  Exam:   General:  Obese male, No acute distress  HEENT:  NCAT, MMM  Cardiovascular:  RRR, nl S1, S2 no mrg, 2+ pulses, warm extremities  Respiratory:  CTAB, no increased WOB  Abdomen:   NABS, soft, NT/ND  MSK:   Normal tone and bulk, right lower extremity with well demarcated line of erythema extending craniocaudal along the medial thigh from the groin to the knee, then laterally along the patella with areas distal and posterior with bright pink erythema.  Bradycardia stereo appears to surround the new 5-6 cm incision along the medial aspect of his thigh from his recent bypass surgery.  There is some honey crust over the incision.  2+ pitting edema and TTP along medial knee where more brightly erythematous.  2cm pressure ulcer over the right distal heel.    Neuro:  Grossly intact  Data Reviewed: Basic Metabolic Panel:  Recent Labs Lab 03/18/15 1855 03/19/15 0545 03/20/15 0329  NA 138 142 142  K 4.7 4.8 4.6  CL 107 115* 113*  CO2 18* 20* 18*  GLUCOSE 309* 92 165*  BUN 30* 26* 29*  CREATININE 1.75* 1.58* 1.71*  CALCIUM 9.2 8.1* 8.3*  MG  --   --  1.9   Liver Function Tests:  Recent Labs Lab 03/18/15 1855 03/19/15 0545 03/20/15 0329  AST 15 16 12*  ALT 6* 7* 7*  ALKPHOS 68 55 46  BILITOT 0.2* 0.4 0.3  PROT 6.6 5.6* 5.3*  ALBUMIN 3.1* 2.5* 2.2*   No results for input(s): LIPASE, AMYLASE in the last 168 hours. No results for input(s): AMMONIA in the last 168 hours. CBC:  Recent Labs Lab 03/18/15 1855 03/19/15 0545 03/20/15 0329  WBC 14.3* 13.5* 8.4  NEUTROABS 13.1*  --  7.1  HGB 8.9* 8.0* 7.1*  HCT 28.3* 26.1* 22.0*  MCV 99.0 100.8* 100.9*  PLT 276 224 196    Recent Results (from the past 240 hour(s))  Blood culture (routine x 2)     Status: None (Preliminary result)   Collection Time: 03/18/15  7:30 PM  Result Value Ref Range Status   Specimen Description BLOOD LEFT ANTECUBITAL  Final   Special  Requests BOTTLES DRAWN AEROBIC AND ANAEROBIC 5CC  Final   Culture NO GROWTH 2 DAYS  Final   Report Status PENDING  Incomplete  Blood culture (routine x 2)     Status: None (Preliminary result)   Collection Time: 03/18/15  7:40 PM  Result Value Ref Range Status   Specimen Description BLOOD RIGHT WRIST  Final   Special Requests BOTTLES DRAWN AEROBIC AND ANAEROBIC 5CC  Final   Culture NO GROWTH 2 DAYS  Final   Report Status PENDING  Incomplete  Urine culture     Status: None   Collection Time: 03/18/15  9:17 PM  Result Value Ref Range Status   Specimen Description URINE, CLEAN CATCH  Final   Special Requests NONE  Final   Culture NO GROWTH 1 DAY  Final   Report Status 03/20/2015 FINAL  Final  MRSA PCR Screening     Status: Abnormal   Collection Time: 03/19/15  6:01 PM  Result  Value Ref Range Status   MRSA by PCR POSITIVE (A) NEGATIVE Final    Comment:        The GeneXpert MRSA Assay (FDA approved for NASAL specimens only), is one component of a comprehensive MRSA colonization surveillance program. It is not intended to diagnose MRSA infection nor to guide or monitor treatment for MRSA infections. RESULT CALLED TO, READ BACK BY AND VERIFIED WITH: SNOW,S RN 03/19/15 1938 [place]      Studies: Dg Chest 2 View  03/18/2015  CLINICAL DATA:  Sepsis, fever, cough. EXAM: CHEST  2 VIEW COMPARISON:  03/01/2015 chest radiograph. FINDINGS: Stable cardiomediastinal silhouette with normal heart size. No pneumothorax. No pleural effusion. Mild scarring versus atelectasis at both lung bases. No pulmonary edema or acute consolidative airspace disease. IMPRESSION: Mild bibasilar scarring versus atelectasis. Otherwise no active disease in the chest. Electronically Signed   By: Ilona Sorrel M.D.   On: 03/18/2015 20:43   Dg Foot Complete Right  03/18/2015  CLINICAL DATA:  75 year old male with toe amputation in January. Patient presenting with fever and right lower extremity swelling. EXAM: RIGHT  FOOT COMPLETE - 3+ VIEW COMPARISON:  Radiograph dated 03/01/2015 FINDINGS: There is stable appearing proximal transmetatarsal amputation of the first digit. There is no acute fracture or dislocation. No erosive changes or evidence of periosteal reaction. The bones are osteopenic. There are degenerative changes of the hindfoot and ankle joint. There is diffuse soft tissue swelling of the midfoot and forefoot. No soft tissue gas or radiopaque foreign object identified. Dressing noted over the foot. IMPRESSION: Stable appearing amputation of the proximal aspect of the first metatarsal. No acute osseous pathology. Diffuse soft tissue swelling of the foot. Electronically Signed   By: Anner Crete M.D.   On: 03/18/2015 20:47    Scheduled Meds: . sodium chloride   Intravenous Once  . allopurinol  100 mg Oral Daily  . atorvastatin  10 mg Oral QHS  . bisacodyl  10 mg Rectal Once  . Chlorhexidine Gluconate Cloth  6 each Topical Q0600  . colchicine  0.6 mg Oral QHS  . collagenase   Topical Daily  . docusate sodium  100 mg Oral BID  . gabapentin  1,200 mg Oral BID  . heparin subcutaneous  5,000 Units Subcutaneous 3 times per day  . insulin aspart  0-9 Units Subcutaneous 6 times per day  . mupirocin ointment  1 application Nasal BID  . pantoprazole  40 mg Oral Daily  . piperacillin-tazobactam (ZOSYN)  IV  3.375 g Intravenous 3 times per day  . polyethylene glycol  17 g Oral Daily  . predniSONE  20 mg Oral Q breakfast  . senna  2 tablet Oral QHS  . sodium chloride flush  3 mL Intravenous Q12H  . vancomycin  1,000 mg Intravenous Q24H   Continuous Infusions: . sodium chloride 125 mL/hr at 03/20/15 1200    Principal Problem:   Sepsis ([place]) Active Problems:   Lumbar disc disease   Type 2 diabetes mellitus with peripheral neuropathy (HCC)   Chronic kidney disease stage 3   Cellulitis   Hypotension   Sepsis, unspecified organism (HCC)   Cellulitis of right lower extremity   Chronic diastolic  CHF (congestive heart failure) ([place])   Pressure ulcer    Time spent: 30 min    Leeroy Lovings, [facility] Pager [phone]. If 7PM-7AM, please contact night-coverage at [email or web address], password [facility] 03/20/2015, 2:00 PM  LOS: 2 days

## 2015-03-20 NOTE — Telephone Encounter (Signed)
LMTCB on brother's phone because Mr. Dozier voice mailbox is full.     Message:   Time of arrival to [facility] for tomorrow's surgery has been changed to 10:30am.

## 2015-03-20 NOTE — Care Management Note (Signed)
Case Management Note  Patient Details  Name: Marc [name] MRN: [id number] Date of Birth: [birth year]  Subjective/Objective:     Adm w sepsis              Action/Plan:lives w fam, act w [facility] for hhrn and hhpt   Expected Discharge Date:                  Expected Discharge Plan:  [place]  In-House Referral:     Discharge planning Services  CM Consult  Post Acute Care Choice:  Resumption of Svcs/PTA Provider Choice offered to:     DME Arranged:    DME Agency:     HH Arranged:  RN, PT [place] Agency:  [facility]  Status of Service:     Medicare Important Message Given:    Date Medicare IM Given:    Medicare IM give by:    Date Additional Medicare IM Given:    Additional Medicare Important Message give by:     If discussed at [place] of Stay Meetings, dates discussed:    Additional Comments: alerted tiffany w [facility] of pt's adm.  Lacretia Leigh, RN 03/20/2015, 11:46 AM

## 2015-03-20 NOTE — Consult Note (Addendum)
WOC wound consult note Reason for Consult: Consult requested for right foot and heel. Pt is followed by VVS team for assessment and plan of care and has been using Santyl for chemical debridement of nonviable tissue prior to admission. Wound type: Right anterior foot with full thickness wound; 7X4X.2cm, 70% yellow, interspersed with 30% red. Small amt yellow drainage, no odor.   Right heel with unstageable pressure injury; 1.5X1.5cm, 90% slough/eschar, 10% red, small amt yellow drainage, no odor. Pressure Ulcer POA: Yes Dressing procedure/placement/frequency: Float right heel to reduce pressure. Continue present plan of care with Santyl for chemical debridement of nonviable tissue to right foot and right heel.  Discussed with patient and he verbalized understanding. Please consult VVS team for further questions regarding plan of care.  Please re-consult if further assistance is needed.  Thank-you,  Julien Girt MSN, [place], [place], [place], [place]

## 2015-03-20 NOTE — Progress Notes (Signed)
Inpatient Diabetes Program Recommendations  AACE/ADA: New Consensus Statement on Inpatient Glycemic Control (2015)  Target Ranges:  Prepandial:   less than 140 mg/dL      Peak postprandial:   less than 180 mg/dL (1-2 hours)      Critically ill patients:  140 - 180 mg/dL   Review of Glycemic Control:  Results for FARHAN, LEHN (MRN [id number]) as of 03/20/2015 10:47  Ref. Range 03/19/2015 10:05 03/19/2015 10:44 03/19/2015 12:35 03/19/2015 15:43 03/19/2015 19:53 03/19/2015 23:33 03/20/2015 03:28  Glucose-Capillary Latest Ref Range: 65-99 mg/dL 68 76 112 (H) 147 (H) 235 (H) 304 (H) 162 (H)   Diabetes history: Type 2 diabetes Outpatient Diabetes medications: Amaryl 4 mg bid Current orders for Inpatient glycemic control:  Novolog sensitive  q 4 hours  Inpatient Diabetes Program Recommendations:    Please consider changing Novolog correction to moderate tid with meals and HS (instead of q 4 hours).  Thanks, Adah Perl, RN, BC-ADM Inpatient Diabetes Coordinator Pager [phone] (8a-5p)

## 2015-03-21 ENCOUNTER — Encounter (HOSPITAL_COMMUNITY)
Admission: EM | Disposition: A | Discharge status: Home Health | Payer: Self-pay | Source: Home / Self Care | Attending: Internal Medicine

## 2015-03-21 ENCOUNTER — Encounter (HOSPITAL_COMMUNITY): Payer: Self-pay | Admitting: Certified Registered"

## 2015-03-21 ENCOUNTER — Inpatient Hospital Stay (HOSPITAL_COMMUNITY): Payer: Medicare Other | Admitting: Certified Registered"

## 2015-03-21 ENCOUNTER — Ambulatory Visit (HOSPITAL_COMMUNITY): Admission: RE | Admit: 2015-03-21 | Payer: Medicare Other | Source: Ambulatory Visit | Admitting: Vascular Surgery

## 2015-03-21 DIAGNOSIS — T879 Unspecified complications of amputation stump: Secondary | ICD-10-CM

## 2015-03-21 HISTORY — DX: Major depressive disorder, single episode, unspecified: F32.9

## 2015-03-21 HISTORY — PX: AMPUTATION TOE: SHX6595

## 2015-03-21 HISTORY — PX: WOUND DEBRIDEMENT: SHX247

## 2015-03-21 HISTORY — DX: Depression, unspecified: F32.A

## 2015-03-21 HISTORY — PX: APPLICATION OF WOUND VAC: SHX5189

## 2015-03-21 LAB — CBC
HCT: 28.2 % — ABNORMAL LOW (ref 39.0–52.0)
Hemoglobin: 8.6 g/dL — ABNORMAL LOW (ref 13.0–17.0)
MCH: 30.2 pg (ref 26.0–34.0)
MCHC: 30.5 g/dL (ref 30.0–36.0)
MCV: 98.9 fL (ref 78.0–100.0)
PLATELETS: 211 10*3/uL (ref 150–400)
RBC: 2.85 MIL/uL — AB (ref 4.22–5.81)
RDW: 17 % — AB (ref 11.5–15.5)
WBC: 6.5 10*3/uL (ref 4.0–10.5)

## 2015-03-21 LAB — GLUCOSE, CAPILLARY
GLUCOSE-CAPILLARY: 174 mg/dL — AB (ref 65–99)
Glucose-Capillary: 123 mg/dL — ABNORMAL HIGH (ref 65–99)
Glucose-Capillary: 151 mg/dL — ABNORMAL HIGH (ref 65–99)
Glucose-Capillary: 276 mg/dL — ABNORMAL HIGH (ref 65–99)
Glucose-Capillary: 311 mg/dL — ABNORMAL HIGH (ref 65–99)

## 2015-03-21 LAB — BASIC METABOLIC PANEL
ANION GAP: 7 (ref 5–15)
BUN: 30 mg/dL — ABNORMAL HIGH (ref 6–20)
CHLORIDE: 115 mmol/L — AB (ref 101–111)
CO2: 19 mmol/L — ABNORMAL LOW (ref 22–32)
CREATININE: 1.78 mg/dL — AB (ref 0.61–1.24)
Calcium: 8.8 mg/dL — ABNORMAL LOW (ref 8.9–10.3)
GFR calc non Af Amer: 36 mL/min — ABNORMAL LOW (ref >60)
GFR, EST AFRICAN AMERICAN: 41 mL/min — AB (ref >60)
Glucose, Bld: 126 mg/dL — ABNORMAL HIGH (ref 65–99)
Potassium: 4.8 mmol/L (ref 3.5–5.1)
SODIUM: 141 mmol/L (ref 135–145)

## 2015-03-21 LAB — SURGICAL PCR SCREEN
MRSA, PCR: NEGATIVE
STAPHYLOCOCCUS AUREUS: NEGATIVE

## 2015-03-21 LAB — PREPARE RBC (CROSSMATCH)

## 2015-03-21 SURGERY — DEBRIDEMENT, WOUND
Anesthesia: General | Site: Toe | Laterality: Right

## 2015-03-21 MED ORDER — LIDOCAINE HCL (CARDIAC) 20 MG/ML IV SOLN
INTRAVENOUS | Status: DC | PRN
  Administered 2015-03-21: 50 mg via INTRAVENOUS

## 2015-03-21 MED ORDER — DEXTROSE 5 % IV SOLN
INTRAVENOUS | Status: AC
  Filled 2015-03-21: qty 1.5

## 2015-03-21 MED ORDER — MEPERIDINE HCL 25 MG/ML IJ SOLN: 6.2500 mg | INTRAMUSCULAR | Status: DC | PRN

## 2015-03-21 MED ORDER — PROPOFOL 10 MG/ML IV BOLUS
INTRAVENOUS | Status: AC
  Filled 2015-03-21: qty 20

## 2015-03-21 MED ORDER — SODIUM CHLORIDE 0.9 % IV SOLN: Freq: Once | INTRAVENOUS | Status: DC

## 2015-03-21 MED ORDER — PROPOFOL 10 MG/ML IV BOLUS
INTRAVENOUS | Status: DC | PRN
  Administered 2015-03-21: 120 mg via INTRAVENOUS

## 2015-03-21 MED ORDER — 0.9 % SODIUM CHLORIDE (POUR BTL) OPTIME
TOPICAL | Status: DC | PRN
  Administered 2015-03-21: 1000 mL

## 2015-03-21 MED ORDER — MIDAZOLAM HCL 2 MG/2ML IJ SOLN
INTRAMUSCULAR | Status: AC
  Filled 2015-03-21: qty 2

## 2015-03-21 MED ORDER — FUROSEMIDE 80 MG PO TABS
80.0000 mg | ORAL_TABLET | Freq: Every day | ORAL | Status: DC
  Administered 2015-03-22 – 2015-03-24 (×3): 80 mg via ORAL
  Filled 2015-03-21 (×3): qty 1

## 2015-03-21 MED ORDER — MIDAZOLAM HCL 2 MG/2ML IJ SOLN: 0.5000 mg | Freq: Once | INTRAMUSCULAR | Status: DC | PRN

## 2015-03-21 MED ORDER — PREDNISONE 20 MG PO TABS
20.0000 mg | ORAL_TABLET | Freq: Every day | ORAL | Status: DC
  Administered 2015-03-24: 20 mg via ORAL
  Filled 2015-03-21: qty 1

## 2015-03-21 MED ORDER — FENTANYL CITRATE (PF) 100 MCG/2ML IJ SOLN
25.0000 ug | INTRAMUSCULAR | Status: DC | PRN
  Administered 2015-03-21 (×2): 50 ug via INTRAVENOUS

## 2015-03-21 MED ORDER — ONDANSETRON HCL 4 MG/2ML IJ SOLN
INTRAMUSCULAR | Status: DC | PRN
  Administered 2015-03-21: 4 mg via INTRAVENOUS

## 2015-03-21 MED ORDER — PREDNISONE 50 MG PO TABS
60.0000 mg | ORAL_TABLET | Freq: Every day | ORAL | Status: AC
  Administered 2015-03-22 – 2015-03-23 (×2): 60 mg via ORAL
  Filled 2015-03-21 (×2): qty 1

## 2015-03-21 MED ORDER — ONDANSETRON HCL 4 MG/2ML IJ SOLN
INTRAMUSCULAR | Status: AC
  Filled 2015-03-21: qty 2

## 2015-03-21 MED ORDER — METHYLPREDNISOLONE SODIUM SUCC 40 MG IJ SOLR
40.0000 mg | Freq: Once | INTRAMUSCULAR | Status: AC
  Administered 2015-03-21: 40 mg via INTRAVENOUS
  Filled 2015-03-21: qty 1

## 2015-03-21 MED ORDER — FENTANYL CITRATE (PF) 250 MCG/5ML IJ SOLN
INTRAMUSCULAR | Status: AC
  Filled 2015-03-21: qty 5

## 2015-03-21 MED ORDER — GLYCOPYRROLATE 0.2 MG/ML IJ SOLN
INTRAMUSCULAR | Status: DC | PRN
  Administered 2015-03-21: 0.2 mg via INTRAVENOUS

## 2015-03-21 MED ORDER — FENTANYL CITRATE (PF) 100 MCG/2ML IJ SOLN
INTRAMUSCULAR | Status: AC
  Filled 2015-03-21: qty 2

## 2015-03-21 MED ORDER — LIDOCAINE HCL (CARDIAC) 20 MG/ML IV SOLN
INTRAVENOUS | Status: AC
  Filled 2015-03-21: qty 5

## 2015-03-21 MED ORDER — PROMETHAZINE HCL 25 MG/ML IJ SOLN: 6.2500 mg | INTRAMUSCULAR | Status: DC | PRN

## 2015-03-21 MED ORDER — MIDAZOLAM HCL 5 MG/5ML IJ SOLN
INTRAMUSCULAR | Status: DC | PRN
  Administered 2015-03-21: 2 mg via INTRAVENOUS

## 2015-03-21 SURGICAL SUPPLY — 34 items
BLADE LONG MED 31MMX9MM (MISCELLANEOUS) ×1
BLADE LONG MED 31X9 (MISCELLANEOUS) ×1 IMPLANT
CANISTER SUCTION 2500CC (MISCELLANEOUS) ×4 IMPLANT
CANISTER WOUND CARE 500ML ATS (WOUND CARE) ×2 IMPLANT
CLIP TI MEDIUM 6 (CLIP) ×2 IMPLANT
CONT SPECI 4OZ STER CLIK (MISCELLANEOUS) ×2 IMPLANT
COVER SURGICAL LIGHT HANDLE (MISCELLANEOUS) ×4 IMPLANT
DRAPE ORTHO SPLIT 77X108 STRL (DRAPES) ×4
DRAPE PROXIMA HALF (DRAPES) ×4 IMPLANT
DRAPE SURG ORHT 6 SPLT 77X108 (DRAPES) IMPLANT
DRSG VAC ATS MED SENSATRAC (GAUZE/BANDAGES/DRESSINGS) ×2 IMPLANT
ELECT REM PT RETURN 9FT ADLT (ELECTROSURGICAL) ×4
ELECTRODE REM PT RTRN 9FT ADLT (ELECTROSURGICAL) ×2 IMPLANT
GLOVE BIO SURGEON STRL SZ 6.5 (GLOVE) ×1 IMPLANT
GLOVE BIO SURGEON STRL SZ7.5 (GLOVE) ×4 IMPLANT
GLOVE BIO SURGEONS STRL SZ 6.5 (GLOVE) ×1
GLOVE BIOGEL PI IND STRL 6.5 (GLOVE) IMPLANT
GLOVE BIOGEL PI IND STRL 7.0 (GLOVE) IMPLANT
GLOVE BIOGEL PI IND STRL 8 (GLOVE) ×2 IMPLANT
GLOVE BIOGEL PI INDICATOR 6.5 (GLOVE) ×4
GLOVE BIOGEL PI INDICATOR 7.0 (GLOVE) ×2
GLOVE BIOGEL PI INDICATOR 8 (GLOVE) ×2
GOWN STRL REUS W/ TWL LRG LVL3 (GOWN DISPOSABLE) ×6 IMPLANT
GOWN STRL REUS W/TWL LRG LVL3 (GOWN DISPOSABLE) ×12
KIT BASIN OR (CUSTOM PROCEDURE TRAY) ×4 IMPLANT
KIT ROOM TURNOVER OR (KITS) ×4 IMPLANT
NS IRRIG 1000ML POUR BTL (IV SOLUTION) ×4 IMPLANT
PACK GENERAL/GYN (CUSTOM PROCEDURE TRAY) ×2 IMPLANT
PAD ARMBOARD 7.5X6 YLW CONV (MISCELLANEOUS) ×8 IMPLANT
SUT VIC AB 3-0 SH 27 (SUTURE) ×4
SUT VIC AB 3-0 SH 27X BRD (SUTURE) IMPLANT
SWAB COLLECTION DEVICE MRSA (MISCELLANEOUS) ×2 IMPLANT
SWAB CULTURE ESWAB REG 1ML (MISCELLANEOUS) ×2 IMPLANT
WATER STERILE IRR 1000ML POUR (IV SOLUTION) ×4 IMPLANT

## 2015-03-21 NOTE — Progress Notes (Signed)
Pt became very upset, raising his voice with staff. Wishes to know plan of care regarding R foot surgery? Dr. Bridgett Larsson paged earlier this morning around 0830. Currently in surgery, asked to talk to patient as soon as possible. Pt states Dr. Doren Custard has been taking care of his R foot in past. Attempting to get in touch with Dr. Doren Custard.

## 2015-03-21 NOTE — Progress Notes (Signed)
Spoke with Dr. Doren Custard. Pt is on schedule to have surgery today. Awaiting new orders now.

## 2015-03-21 NOTE — Interval H&P Note (Signed)
History and Physical Interval Note:  03/21/2015 11:30 AM  Marc [name]  has presented today for surgery, with the diagnosis of Nonhealing Wound Right Foot  T81.89D  The various methods of treatment have been discussed with the patient and family. After consideration of risks, benefits and other options for treatment, the patient has consented to  Procedure(s): DEBRIDEMENT WOUND of RIGHT FOOT (Right) as a surgical intervention .  The patient's history has been reviewed, patient examined, no change in status, stable for surgery.  I have reviewed the patient's chart and labs.  Questions were answered to the patient's satisfaction.     Deitra Mayo

## 2015-03-21 NOTE — Anesthesia Procedure Notes (Signed)
Procedure Name: Intubation Date/Time: 03/21/2015 12:05 PM Performed by: Melina Copa, Jarvis Sawa R Pre-anesthesia Checklist: Patient identified, Emergency Drugs available, Suction available, Patient being monitored and Timeout performed Patient Re-evaluated:Patient Re-evaluated prior to inductionOxygen Delivery Method: Circle system utilized Preoxygenation: Pre-oxygenation with 100% oxygen Intubation Type: IV induction Ventilation: Mask ventilation without difficulty LMA: LMA inserted LMA Size: 5.0 Number of attempts: 1 Placement Confirmation: positive ETCO2 and breath sounds checked- equal and bilateral Tube secured with: Tape Dental Injury: Teeth and Oropharynx as per pre-operative assessment

## 2015-03-21 NOTE — Anesthesia Postprocedure Evaluation (Signed)
Anesthesia Post Note  Patient: Marc [name]  Procedure(s) Performed: Procedure(s) (LRB): DEBRIDEMENT WOUND of RIGHT FOOT (Right) APPLICATION OF WOUND VAC RIGHT FOOT (Right) AMPUTATION TOE-RIGHT SECOND TOE (Right)  Patient location during evaluation: PACU Anesthesia Type: General Level of consciousness: awake and alert, oriented and patient cooperative Pain management: pain level controlled Vital Signs Assessment: post-procedure vital signs reviewed and stable Respiratory status: spontaneous breathing, nonlabored ventilation, respiratory function stable and patient connected to nasal cannula oxygen Cardiovascular status: blood pressure returned to baseline and stable Postop Assessment: no signs of nausea or vomiting Anesthetic complications: no    Last Vitals:  Filed Vitals:   03/21/15 1345 03/21/15 1355  BP: 137/68 133/56  Pulse: 43 39  Temp:  36.6 C  Resp: 10 11    Last Pain:  Filed Vitals:   03/21/15 1356  PainSc: Asleep                 Noraa Pickeral,E. Jaydenn Boccio

## 2015-03-21 NOTE — Op Note (Signed)
    NAME: Marc [name]   MRN: [id number] DOB: 01-31-1940    DATE OF OPERATION: 03/21/2015  PREOP DIAGNOSIS: Nonhealing open right great toe amputation  POSTOP DIAGNOSIS: Same  PROCEDURE:  1. Excisional debridement of right great toe amputation site 2. Ray amputation of right second toe 3. Placement of VAC  SURGEON: Judeth Cornfield. Scot Dock, MD, FACS  ASSIST: None  ANESTHESIA: Gen.   EBL: Minimal  INDICATIONS: Marc [name] is a 75 y.o. male Who has undergone right lower extremity revascularization and ray amputation of the right great toe. He had significant devitalized tissue at the open amputation site and this could not be debrided at the bedside. He is brought to the operating room for excisional debridement and possible ray amputation of the right second toe.  FINDINGS: The devitalized tissue extended to the Second metatarsal bone And for this reason ray amputation of the right second toe was performed.  TECHNIQUE: The patient was taken to the operating room And received a general anesthetic. The right foot was prepped and draped in usual sterile fashion. Excisional debridement was done of the open right great toe amputation site involving tendon, fascia, skin, and subcutaneous tissue. This extended down to the metatarsal head of the second toe and for this reason I felt it was necessary to do a ray amputation of the right second toe. The periosteum was elevated and the bone divided using a CD4 saw. The tissue appeared well perfused and hemostasis was obtained using electrocautery. There was a deep That did not have purulent material but that tract in the deep foot. This was irrigated with saline. Once hemostasis was obtained I elected to place a negative pressure dressing. A strip of sponge was placed into the deep space and then a second piece covered the open wound. Dressing was applied and it was a good seal. This was connected to suction -125 cm the patient tolerated the  procedure well and was transferred to the recovery room in stable condition. All needle and sponge counts were correct.  Deitra Mayo, MD, FACS [facility]  DATE OF DICTATION:   03/21/2015

## 2015-03-21 NOTE — Progress Notes (Signed)
[facility] PROGRESS NOTE  Marc [name] [id number] DOB: Apr 17, 1940 DOA: 03/18/2015 PCP: [name], MD  Brief Summary  Marc [name] is a 75 y.o.WM PMHx Depression, HTN, Bradycardia, CAD native artery PAD, PVD S/P Rt great toe amp and recent Rt fem-pop bypass, DM Type 2 who presents with fever and leg pain.  Patient admitted in early January for right toe gangrene, had R great toe amputation and R fem-pop bypass at that time. Two weeks ago, readmitted with hypotension, resolved with fluids, suspected to be medication-induced not septic.  On the morning of admission, the patient developed redness and swelling and warmth of his right leg, as well as fever and malaise/sluggishness. The fever did not respond to acetaminophen and the pain and redness persisted and so he came to the ER. In the ED, he was febrile to 102.32F, tachycardic, and hypotensive to 80/40. K4.7, creatinine 1.7 (at baseline), WBC 14.2, lactate 2.26. A chest x-ray was normal. Urinalysis showed rare bacteria.    Assessment/Plan  Sepsis unspecified organism but likely due to RLE cellulitis, tachycardia, tachypnea, fever, leukocytosis, and evidence of organ dysfunction (elevated lactate and hypotension). Cellulitis looks markedly improved - Continue Piperacillin-tazobactam and Vancomycin, renally dosed -  Acetaminophen for fever -  Will likely need long course of antibiotics because he may have seeded his recent bypass.   Nonhealing open right great toe amputation -  Appreciate vascular surgery assistance -  Underwent excisional debridement of the right great toe with ray amputation of the right second toe on 2/21 -  Wound vac -  F/u wound culture  Hypotension, improving - cortisol 16.7 -continue prednisone but increase to three times daily dose for three days, then resume 20mg  daily thereafter   Chronic diastolic CHF:  -Strict in and out:  Net positive last few days -Daily weight:  Increasing  daily -resume furosemide  HTN:  -Patient currently borderline hypotensive  -Hold all BP medication   IDDM:  -Continue gabapentin 1200 mg BID -Continue sensitive SSI  -Hold glimepiride  Gout:  -Continue daily colchicine -Reduced allopurinol to 100 daily -Uric acid 3   Low back pain, spinal stenosis:  -Chronically on Prednisone 20 mg daily for his low back pain.  -Recommend patient taper this medication off as quickly as possible as likely interfering with wound healing  Acute on chronic anemia, no obvious blood loss, ferritin, b12, and TSH wnl -  Transfused 1 unit PRBC on 2/20  Chronic kidney disease stage 3, baseline creatinine 1.5 due to diabetes, trending up slightly  -  Minimize nephrotoxins and renally dose medications, creatinine near baseline  Leukocytosis, resolved with antibiotics  Constipation, likely narcotic induced, start Colace, senna, MiraLAX with a bisacodyl suppository today  Diet:  Diabetic/healthy heart Access:  PIV IVF:  Off Proph:  Heparin  Code Status: DO NOT RESUSCITATE Family Communication: Patient alone Disposition Plan: Pending improvement in right lower extremity cellulitis, blood pressure   Consultants:  Vascular surgery, Dr. Deitra Mayo  Procedures:  Chest x-ray  Right foot x-ray  Excisional debridement of right great toe amputation site with ray amputation of right second toe and wound vac placement on 2/21 by Dr. Rosalia Hammers  Cultures 2/18 blood left AC/right wrist NGTD 2/18 urine neg 2/19 MRSA by PCR positive   Antibiotics: Zosyn 2/18>> Vancomycin 2/18>>  HPI/Subjective:  Continues to have pain and swelling of his right lower extremity but somewhat improved today.  Chills are decreasing.  Had a large BM overnight.  Objective: Filed Vitals:  03/21/15 1345 03/21/15 1355 03/21/15 1431 03/21/15 1445  BP: 137/68 133/56 118/54 120/51  Pulse: 43 39  42  Temp:  97.9 F (36.6 C)    TempSrc:      Resp: 10  11  11   Height:      Weight:      SpO2: 100% 97%  97%    Intake/Output Summary (Last 24 hours) at 03/21/15 1614 Last data filed at 03/21/15 1355  Gross per 24 hour  Intake    850 ml  Output   1225 ml  Net   -375 ml   Filed Weights   03/19/15 1700 03/20/15 0500 03/21/15 0500  Weight: 90.8 kg (200 lb 2.8 oz) 92.3 kg (203 lb 7.8 oz) 94 kg (207 lb 3.7 oz)   Body mass index is 27.35 kg/(m^2).  Exam:   General:  Obese male, No acute distress  HEENT:  NCAT, MMM  Cardiovascular:  RRR, nl S1, S2 no mrg, 2+ pulses, warm extremities  Respiratory:  CTAB, no increased WOB  Abdomen:   NABS, soft, NT/ND  MSK:   Normal tone and bulk, right lower extremity with markedly decreased degree of erythema (less pink) since  Yesterday and erythema starting to recede from previously drawn line that runs craniocaudal along the medial thigh from the groin to the knee, then laterally along the patella with areas distal and posterior with erythema.  Foot amputation site now has a gangrenous odor with dusky discoloration particularly in the distal portion.   2+ pitting edema bilaterally.  2cm pressure ulcer over the right distal heel.    Data Reviewed: Basic Metabolic Panel:  Recent Labs Lab 03/18/15 1855 03/19/15 0545 03/20/15 0329 03/21/15 0814  NA 138 142 142 141  K 4.7 4.8 4.6 4.8  CL 107 115* 113* 115*  CO2 18* 20* 18* 19*  GLUCOSE 309* 92 165* 126*  BUN 30* 26* 29* 30*  CREATININE 1.75* 1.58* 1.71* 1.78*  CALCIUM 9.2 8.1* 8.3* 8.8*  MG  --   --  1.9  --    Liver Function Tests:  Recent Labs Lab 03/18/15 1855 03/19/15 0545 03/20/15 0329  AST 15 16 12*  ALT 6* 7* 7*  ALKPHOS 68 55 46  BILITOT 0.2* 0.4 0.3  PROT 6.6 5.6* 5.3*  ALBUMIN 3.1* 2.5* 2.2*   No results for input(s): LIPASE, AMYLASE in the last 168 hours. No results for input(s): AMMONIA in the last 168 hours. CBC:  Recent Labs Lab 03/18/15 1855 03/19/15 0545 03/19/15 1905 03/20/15 0329 03/21/15 0814  WBC  14.3* 13.5*  --  8.4 6.5  NEUTROABS 13.1*  --   --  7.1  --   HGB 8.9* 8.0*  --  7.1* 8.6*  HCT 28.3* 26.1* 23.5* 22.0* 28.2*  MCV 99.0 100.8*  --  100.9* 98.9  PLT 276 224  --  196 211    Recent Results (from the past 240 hour(s))  Blood culture (routine x 2)     Status: None (Preliminary result)   Collection Time: 03/18/15  7:30 PM  Result Value Ref Range Status   Specimen Description BLOOD LEFT ANTECUBITAL  Final   Special Requests BOTTLES DRAWN AEROBIC AND ANAEROBIC 5CC  Final   Culture NO GROWTH 3 DAYS  Final   Report Status PENDING  Incomplete  Blood culture (routine x 2)     Status: None (Preliminary result)   Collection Time: 03/18/15  7:40 PM  Result Value Ref Range Status   Specimen Description BLOOD  RIGHT WRIST  Final   Special Requests BOTTLES DRAWN AEROBIC AND ANAEROBIC 5CC  Final   Culture NO GROWTH 3 DAYS  Final   Report Status PENDING  Incomplete  Urine culture     Status: None   Collection Time: 03/18/15  9:17 PM  Result Value Ref Range Status   Specimen Description URINE, CLEAN CATCH  Final   Special Requests NONE  Final   Culture NO GROWTH 1 DAY  Final   Report Status 03/20/2015 FINAL  Final  MRSA PCR Screening     Status: Abnormal   Collection Time: 03/19/15  6:01 PM  Result Value Ref Range Status   MRSA by PCR POSITIVE (A) NEGATIVE Final    Comment:        The GeneXpert MRSA Assay (FDA approved for NASAL specimens only), is one component of a comprehensive MRSA colonization surveillance program. It is not intended to diagnose MRSA infection nor to guide or monitor treatment for MRSA infections. RESULT CALLED TO, READ BACK BY AND VERIFIED WITH: SNOW,S RN 03/19/15 1938 [place]   Surgical pcr screen     Status: None   Collection Time: 03/21/15 10:00 AM  Result Value Ref Range Status   MRSA, PCR NEGATIVE NEGATIVE Final   Staphylococcus aureus NEGATIVE NEGATIVE Final    Comment:        The Xpert SA Assay (FDA approved for NASAL specimens in  patients over 40 years of age), is one component of a comprehensive surveillance program.  Test performance has been validated by [facility] for patients greater than or equal to 68 year old. It is not intended to diagnose infection nor to guide or monitor treatment.      Studies: No results found.  Scheduled Meds: . allopurinol  100 mg Oral Daily  . atorvastatin  10 mg Oral QHS  . bisacodyl  10 mg Rectal Once  . Chlorhexidine Gluconate Cloth  6 each Topical Q0600  . colchicine  0.6 mg Oral QHS  . collagenase   Topical Daily  . docusate sodium  100 mg Oral BID  . gabapentin  1,200 mg Oral BID  . heparin subcutaneous  5,000 Units Subcutaneous 3 times per day  . insulin aspart  0-15 Units Subcutaneous TID WC  . insulin aspart  0-5 Units Subcutaneous QHS  . mupirocin ointment  1 application Nasal BID  . pantoprazole  40 mg Oral Daily  . piperacillin-tazobactam (ZOSYN)  IV  3.375 g Intravenous 3 times per day  . polyethylene glycol  17 g Oral Daily  . predniSONE  20 mg Oral Q breakfast  . senna  2 tablet Oral QHS  . sodium chloride flush  3 mL Intravenous Q12H  . vancomycin  1,000 mg Intravenous Q24H   Continuous Infusions:    Principal Problem:   Sepsis ([place]) Active Problems:   Lumbar disc disease   Type 2 diabetes mellitus with peripheral neuropathy (HCC)   Chronic kidney disease stage 3   Cellulitis   Hypotension   Sepsis, unspecified organism ([place])   Cellulitis of right lower extremity   Chronic diastolic CHF (congestive heart failure) ([place])   Pressure ulcer    Time spent: 30 min    Janmarie Smoot, [place] [facility] Pager [phone]. If 7PM-7AM, please contact night-coverage at [email or web address], password [place] 03/21/2015, 4:14 PM  LOS: 3 days

## 2015-03-21 NOTE — Care Management Important Message (Signed)
Important Message  Patient Details  Name: Marc [name] MRN: [id number] Date of Birth: 09/19/1940   Medicare Important Message Given:  Yes    Lacretia Leigh, RN 03/21/2015, 9:54 AM

## 2015-03-21 NOTE — H&P (View-Only) (Signed)
   Daily Progress Note  Assessment/Planning: s/p R CFA to TPT bypass, EA TPT w/ VPA, R 1st great toe by Dr. Scot Dock, reportedly sepsis with ascending cellulitis   Don't seem any signs of ascending cellulitis in the R leg currently, so need for guillotine amputation  R foot looks clean with out obvious pus or fluctuance so I don't think the patient needs to go to OR immediate for foot exploration  Dr. Scot Dock will check on the patient tomorrow  Pt being admitted to Hospitalist service for IV abx  Subjective    Sleepy, not too clear when his foot got worse  Objective Filed Vitals:   03/19/15 0445 03/19/15 0500 03/19/15 0515 03/19/15 0700  BP: 114/53 120/55 117/48 131/58  Pulse: 67 70 62 74  Temp:      TempSrc:      Resp: 19 18 19 22   SpO2: 97% 97% 98% 96%    Intake/Output Summary (Last 24 hours) at 03/19/15 0730 Last data filed at 03/19/15 0533  Gross per 24 hour  Intake      0 ml  Output    500 ml  Net   -500 ml    VASC  Clean amputation wound bed with devasc fat, slightly  exposed metatarsal, no fluctuance, no pus on probing, no obvious ascending erythema   Laboratory CBC    Component Value Date/Time   WBC 13.5* 03/19/2015 0545   HGB 8.0* 03/19/2015 0545   HCT 26.1* 03/19/2015 0545   PLT 224 03/19/2015 0545    BMET    Component Value Date/Time   NA 142 03/19/2015 0545   K 4.8 03/19/2015 0545   CL 115* 03/19/2015 0545   CO2 20* 03/19/2015 0545   GLUCOSE 92 03/19/2015 0545   BUN 26* 03/19/2015 0545   CREATININE 1.58* 03/19/2015 0545   CALCIUM 8.1* 03/19/2015 0545   GFRNONAA 41* 03/19/2015 0545   GFRAA 48* 03/19/2015 0545    Adele Barthel, MD [facility] [place]: [phone] Pager: [phone]  03/19/2015, 7:30 AM

## 2015-03-21 NOTE — Transfer of Care (Signed)
Immediate Anesthesia Transfer of Care Note  Patient: Marc [name]  Procedure(s) Performed: Procedure(s): DEBRIDEMENT WOUND of RIGHT FOOT (Right) APPLICATION OF WOUND VAC (Right) AMPUTATION TOE-RIGHT SECOND TOE (Right)  Patient Location: PACU  Anesthesia Type:General  Level of Consciousness: awake, oriented and patient cooperative  Airway & Oxygen Therapy: Patient Spontanous Breathing and Patient connected to nasal cannula oxygen  Post-op Assessment: Report given to RN, Post -op Vital signs reviewed and stable and Patient moving all extremities  Post vital signs: Reviewed and stable  Last Vitals:  Filed Vitals:   03/21/15 0800 03/21/15 1306  BP: 138/58   Pulse: 31 48  Temp:  36.3 C  Resp: 11 15    Complications: No apparent anesthesia complications

## 2015-03-21 NOTE — Anesthesia Preprocedure Evaluation (Addendum)
Anesthesia Evaluation  Patient identified by MRN, date of birth, ID band Patient awake    Reviewed: Allergy & Precautions, NPO status , Patient's Chart, lab work & pertinent test results  History of Anesthesia Complications Negative for: history of anesthetic complications  Airway Mallampati: II  TM Distance: >3 FB Neck ROM: Full    Dental  (+) Edentulous Upper, Missing, Dental Advisory Given, Poor Dentition   Pulmonary COPD, Current Smoker,    breath sounds clear to auscultation       Cardiovascular hypertension, Pt. on medications + CAD and + Peripheral Vascular Disease (iliac stent, fem-peroneal)   Rhythm:Regular Rate:Normal  '13 Stress: NL LV Function; NL Wall Motion   Neuro/Psych    GI/Hepatic Neg liver ROS, GERD  Medicated and Controlled,  Endo/Other  diabetes (glu 151), Oral Hypoglycemic Agents  Renal/GU Renal InsufficiencyRenal disease (creat 1.78)     Musculoskeletal   Abdominal   Peds  Hematology  (+) Blood dyscrasia (Hb 8.6), ,   Anesthesia Other Findings   Reproductive/Obstetrics                           Anesthesia Physical Anesthesia Plan  ASA: III  Anesthesia Plan: General   Post-op Pain Management:    Induction: Intravenous  Airway Management Planned: LMA  Additional Equipment:   Intra-op Plan:   Post-operative Plan:   Informed Consent: I have reviewed the patients History and Physical, chart, labs and discussed the procedure including the risks, benefits and alternatives for the proposed anesthesia with the patient or authorized representative who has indicated his/her understanding and acceptance.   Dental advisory given  Plan Discussed with: CRNA and Surgeon  Anesthesia Plan Comments: (Plan routine monitors, GA- LMA OK)        Anesthesia Quick Evaluation

## 2015-03-22 ENCOUNTER — Encounter (HOSPITAL_COMMUNITY): Payer: Medicare Other

## 2015-03-22 ENCOUNTER — Ambulatory Visit: Payer: Medicare Other | Admitting: Vascular Surgery

## 2015-03-22 ENCOUNTER — Encounter (HOSPITAL_COMMUNITY): Payer: Self-pay | Admitting: Vascular Surgery

## 2015-03-22 LAB — BASIC METABOLIC PANEL
ANION GAP: 5 (ref 5–15)
BUN: 30 mg/dL — ABNORMAL HIGH (ref 6–20)
CO2: 20 mmol/L — AB (ref 22–32)
Calcium: 8.6 mg/dL — ABNORMAL LOW (ref 8.9–10.3)
Chloride: 114 mmol/L — ABNORMAL HIGH (ref 101–111)
Creatinine, Ser: 1.68 mg/dL — ABNORMAL HIGH (ref 0.61–1.24)
GFR calc non Af Amer: 38 mL/min — ABNORMAL LOW (ref >60)
GFR, EST AFRICAN AMERICAN: 44 mL/min — AB (ref >60)
GLUCOSE: 203 mg/dL — AB (ref 65–99)
POTASSIUM: 5 mmol/L (ref 3.5–5.1)
Sodium: 139 mmol/L (ref 135–145)

## 2015-03-22 LAB — CBC
HEMATOCRIT: 26.6 % — AB (ref 39.0–52.0)
HEMOGLOBIN: 8.6 g/dL — AB (ref 13.0–17.0)
MCH: 32.1 pg (ref 26.0–34.0)
MCHC: 32.3 g/dL (ref 30.0–36.0)
MCV: 99.3 fL (ref 78.0–100.0)
Platelets: 212 10*3/uL (ref 150–400)
RBC: 2.68 MIL/uL — ABNORMAL LOW (ref 4.22–5.81)
RDW: 16.8 % — ABNORMAL HIGH (ref 11.5–15.5)
WBC: 6 10*3/uL (ref 4.0–10.5)

## 2015-03-22 LAB — GLUCOSE, CAPILLARY
Glucose-Capillary: 168 mg/dL — ABNORMAL HIGH (ref 65–99)
Glucose-Capillary: 230 mg/dL — ABNORMAL HIGH (ref 65–99)
Glucose-Capillary: 283 mg/dL — ABNORMAL HIGH (ref 65–99)

## 2015-03-22 LAB — VANCOMYCIN, TROUGH: Vancomycin Tr: 14 ug/mL (ref 10.0–20.0)

## 2015-03-22 MED ORDER — GLIMEPIRIDE 4 MG PO TABS: 4.0000 mg | ORAL_TABLET | Freq: Two times a day (BID) | ORAL | Status: DC

## 2015-03-22 MED ORDER — GLIMEPIRIDE 2 MG PO TABS
2.0000 mg | ORAL_TABLET | Freq: Two times a day (BID) | ORAL | Status: DC
  Administered 2015-03-22 – 2015-03-24 (×4): 2 mg via ORAL
  Filled 2015-03-22 (×5): qty 1

## 2015-03-22 NOTE — Progress Notes (Signed)
ANTIBIOTIC CONSULT NOTE - INITIAL  Pharmacy Consult for Vancomycin Indication: Sepsis  No Known Allergies  Patient Measurements: Height: 6\' 1"  (185.4 cm) Weight: 209 lb 7 oz (95 kg) IBW/kg (Calculated) : 79.9 Adjusted Body Weight:    Vital Signs: Temp: 98.5 F (36.9 C) (02/22 2118) Temp Source: Oral (02/22 2118) BP: 126/52 mmHg (02/22 2118) Pulse Rate: 42 (02/22 2118) Intake/Output from previous day: 02/21 0701 - 02/22 0700 In: 900 [P.O.:200; I.V.:400; IV Piggyback:300] Out: 1000 [Urine:900; Drains:50; Blood:50] Intake/Output from this shift: Total I/O In: 0  Out: 300 [Urine:300]  Labs:  Recent Labs  03/20/15 0329 03/21/15 0814 03/22/15 0636  WBC 8.4 6.5 6.0  HGB 7.1* 8.6* 8.6*  PLT 196 211 212  CREATININE 1.71* 1.78* 1.68*   Estimated Creatinine Clearance: 42.9 mL/min (by C-G formula based on Cr of 1.68).  Recent Labs  03/22/15 2025  [place] 14     Microbiology:   Medical History: Past Medical History  Diagnosis Date  . Hypertension   . Ulcer disease   . Colon polyp   . Gout   . Bradycardia   . Hypercholesteremia   . PAD (peripheral artery disease) ([place])   . Chronic bronchitis ([place])   . Type II diabetes mellitus ([place])   . History of GI bleed 2007  . Gout   . Tobacco abuse disorder 03/10/2012  . GERD (gastroesophageal reflux disease)   . Pneumonia     denies  . ARF (acute renal failure) ([place]) 12/2014  . Neuropathy ([place])   . Arthritis   . Anemia     low iron  . Depression     occassional.  Situational  . History of blood transfusion     Assessment:  Marc [name] is a 75 y.o. male admitted on 03/18/2015 with sepsis and cellulitis.  Pharmacy has been consulted for Vancomycin/Zosyn dosing.  Infectious Disease: Day #4 of abx for sepsis from LLE cellulitis. Afeb, WBC 6 S/p debridement of great toe and amputation of second toe 2/21, may have seeded recent bypass, will get ID involved if staph  2/22 VT 14 (drawn 1 hr late)  Zosyn  2/18 >> Vancomycin 2/18 >>  Blood cx 2/18 >>ngtd Urine cx 2/18 >>ngF 2/21 wound r foot >>GPC  Goal of Therapy:  Vancomycin trough level 15-20 mcg/ml  Plan:  Continue Vancomycin 1g IV q24h    Fernando Stoiber S. Alford Highland, PharmD, BCPS Clinical Staff Pharmacist Pager [phone]  Eilene Ghazi Stillinger 03/22/2015,10:05 PM

## 2015-03-22 NOTE — Progress Notes (Signed)
NURSING PROGRESS NOTE  Marc [name] [id number] Transfer Data: 03/22/2015 7:03 PM Attending Provider: Cherene Altes, MD Marc [name] OLIVER, MD Code Status: DNR  Marc [name] is a 75 y.o. male patient transferred from [place]  -No acute distress noted.  -No complaints of shortness of breath.  -No complaints of chest pain.     Blood pressure 128/52, pulse 41, temperature 98.5 F (36.9 C), temperature source Oral, resp. rate 16, height 6\' 1"  (1.854 m), weight 95 kg (209 lb 7 oz), SpO2 99 %.   IV Fluids:  IV in place, SL.  Allergies:  Review of patient's allergies indicates no known allergies.  Past Medical History:   has a past medical history of Hypertension; Ulcer disease; Colon polyp; Gout; Bradycardia; Hypercholesteremia; PAD (peripheral artery disease) ([place]); Chronic bronchitis ([place]); Type II diabetes mellitus ([place]); History of GI bleed (2007); Gout; Tobacco abuse disorder (03/10/2012); GERD (gastroesophageal reflux disease); Pneumonia; ARF (acute renal failure) ([place]) (12/2014); Neuropathy ([place]); Arthritis; Anemia; Depression; and History of blood transfusion.  Past Surgical History:   has past surgical history that includes Splenectomy (~ 1957); Anterior cervical decomp/discectomy fusion (~ 2007); Upper endoscopy w/ sclerotherapy (~ 2007); Polypectomy; Colonoscopy; Iliac artery stent (Left); Hernia repair (~ 2007); Tonsillectomy (~ 1947); Lumbar laminectomy (11/14); Total knee arthroplasty (Right, 02/03/2013); lower extremity angiogram (N/A, 02/04/2012); percutaneous stent intervention (Left, 02/04/2012); Replacement total knee (Right); Back surgery; Anterior lat lumbar fusion (Left, 01/19/2014); Cardiac catheterization (N/A, 02/02/2015); Eye surgery (Bilateral); Bypass graft femoral-peroneal (Right, 02/07/2015); Amputation (Right, 02/07/2015); Application if wound vac (Right, 02/07/2015); Intraoperative arteriogram (Right, 02/07/2015); Endarterectomy tibioperoneal (Right, 02/07/2015);  Patch angioplasty (Right, 02/07/2015); Wound debridement (Right, 03/21/2015); Application if wound vac (Right, 03/21/2015); and Amputation toe (Right, 03/21/2015).  Social History:   reports that he has been smoking Cigarettes.  He has a 87 pack-year smoking history. He has quit using smokeless tobacco. His smokeless tobacco use included Chew. He reports that he does not drink alcohol or use illicit drugs.  Patient/Family orientated to room. Information packet given to patient/family. Admission inpatient armband information verified with patient/family to include name and date of birth and placed on patient arm. Side rails up x 2, fall assessment and education completed with patient/family. Patient/family able to verbalize understanding of risk associated with falls and verbalized understanding to call for assistance before getting out of bed. Call light within reach. Patient/family able to voice and demonstrate understanding of unit orientation instructions.    Will continue to evaluate and treat per MD orders.

## 2015-03-22 NOTE — Progress Notes (Signed)
[facility] TEAM 1 - Stepdown/ICU TEAM PROGRESS NOTE  Marc [name] [id number] DOB: [birth year] DOA: 03/18/2015 PCP: [name], MD  Admit HPI / Brief Narrative: 75 y.o.M Hx Depression, HTN, Bradycardia, CAD, PAD, PVD S/P Rt great toe amp and recent Rt fem-pop bypass, and DM 2 who presented with fever and leg pain. Patient admitted in early January for right toe gangrene, had R great toe amputation and R fem-pop bypass at that time. Two weeks prior to this admit pt was readmitted with hypotension, resolved with fluids, suspected to be medication-induced not septic. On the morning of this admission the patient developed redness and swelling and warmth of his right leg, as well as fever and malaise/sluggishness. The fever did not respond to acetaminophen and the pain and redness persisted and so he came to the ER. In the ED, he was febrile to 102.60F, tachycardic, and hypotensive to 80/40. K4.7, creatinine 1.7 (at baseline), WBC 14.2, lactate 2.26. A chest x-ray was normal. Urinalysis showed rare bacteria.   HPI/Subjective: The patient is resting comfortably in bed.  He has no new complaints.  He denies chest pain fevers chills nausea vomiting or abdominal pain.  Assessment/Plan:  Sepsis due to RLE cellulitis -continue Piperacillin-tazobactam and Vancomycin for now -will likely need long course of antibiotics because he may have seeded his recent bypass - need to await speciation and sensitivities of his wound cx to determine specific abx and route to be administered - if this proved to be Staph, will need to consider ID input prior to d/c    Nonhealing open right great toe amputation -Care per Vasc Surgery, who has cleared for d/c home from their standpoint w/ home VAC tx  -Underwent excisional debridement of the right great toe with ray amputation of the right second toe on 2/21  Hypotension -cortisol 16.7 - continue prednisone but increase to three times daily dose for  three days, then resume 20mg  daily thereafter, with plan for slow taper to off   Chronic diastolic CHF  -no evidence of volume overload at this time   HTN  -not an active issue at this time   DM  -CBG quite variable, but at times as high as 300 - resume home amaryl in preparation for d/c home   Gout  -Continue daily colchicine - Reduced allopurinol to 100 daily - Uric acid 3 - denies acute flair   Low back pain, spinal stenosis  -Chronically on Prednisone 20 mg daily for his low back pain - Recommend patient taper this medication off as quickly as possible as likely interfering with wound healing  Acute on chronic anemia no obvious blood loss, ferritin, b12, and TSH wnl -transfused 1 unit PRBC on 2/20  Chronic kidney disease stage 3 baseline creatinine 1.5 - crt appears to be slowly improving - recheck in AM   Constipation likely narcotic induced, start Colace, senna, MiraLAX with a bisacodyl suppository today  MRSA screen +  Code Status: FULL Family Communication: no family present at time of exam Disposition Plan: transfer to med bed - await results of wound cx to determine abx dosing and length of course (may require ID input)  Consultants: Vasc Surgery   Procedures: 2/21 - excisional debridement of right great toe amputation site with ray amputation of right second toe and wound vac placement  Antibiotics: Zosyn 2/18 > Vancomycin 2/18 >  DVT prophylaxis: SQ heparin   Objective: Blood pressure 131/53, pulse 48, temperature 98.3 F (36.8 C), temperature source Oral, resp.  rate 21, height 6\' 1"  (1.854 m), weight 94.2 kg (207 lb 10.8 oz), SpO2 95 %.  Intake/Output Summary (Last 24 hours) at 03/22/15 1044 Last data filed at 03/22/15 [id number]  Gross per 24 hour  Intake    850 ml  Output    700 ml  Net    150 ml   Exam: General: No acute respiratory distress Lungs: Clear to auscultation bilaterally without wheezes or crackles Cardiovascular: Regular rate and  rhythm without murmur gallop or rub normal S1 and S2 Abdomen: Nontender, nondistended, soft, bowel sounds positive, no rebound, no ascites, no appreciable mass Extremities: No significant cyanosis, clubbing, or edema bilateral lower extremities - VAC in place on R foot   Data Reviewed:  Basic Metabolic Panel:  Recent Labs Lab 03/18/15 1855 03/19/15 0545 03/20/15 0329 03/21/15 0814 03/22/15 0636  NA 138 142 142 141 139  K 4.7 4.8 4.6 4.8 5.0  CL 107 115* 113* 115* 114*  CO2 18* 20* 18* 19* 20*  GLUCOSE 309* 92 165* 126* 203*  BUN 30* 26* 29* 30* 30*  CREATININE 1.75* 1.58* 1.71* 1.78* 1.68*  CALCIUM 9.2 8.1* 8.3* 8.8* 8.6*  MG  --   --  1.9  --   --     CBC:  Recent Labs Lab 03/18/15 1855 03/19/15 0545 03/19/15 1905 03/20/15 0329 03/21/15 0814 03/22/15 0636  WBC 14.3* 13.5*  --  8.4 6.5 6.0  NEUTROABS 13.1*  --   --  7.1  --   --   HGB 8.9* 8.0*  --  7.1* 8.6* 8.6*  HCT 28.3* 26.1* 23.5* 22.0* 28.2* 26.6*  MCV 99.0 100.8*  --  100.9* 98.9 99.3  PLT 276 224  --  196 211 212    Liver Function Tests:  Recent Labs Lab 03/18/15 1855 03/19/15 0545 03/20/15 0329  AST 15 16 12*  ALT 6* 7* 7*  ALKPHOS 68 55 46  BILITOT 0.2* 0.4 0.3  PROT 6.6 5.6* 5.3*  ALBUMIN 3.1* 2.5* 2.2*   CBG:  Recent Labs Lab 03/21/15 1107 03/21/15 1314 03/21/15 1641 03/21/15 2138 03/22/15 0819  GLUCAP 151* 174* 276* 311* 168*    Recent Results (from the past 240 hour(s))  Blood culture (routine x 2)     Status: None (Preliminary result)   Collection Time: 03/18/15  7:30 PM  Result Value Ref Range Status   Specimen Description BLOOD LEFT ANTECUBITAL  Final   Special Requests BOTTLES DRAWN AEROBIC AND ANAEROBIC 5CC  Final   Culture NO GROWTH 3 DAYS  Final   Report Status PENDING  Incomplete  Blood culture (routine x 2)     Status: None (Preliminary result)   Collection Time: 03/18/15  7:40 PM  Result Value Ref Range Status   Specimen Description BLOOD RIGHT WRIST  Final    Special Requests BOTTLES DRAWN AEROBIC AND ANAEROBIC 5CC  Final   Culture NO GROWTH 3 DAYS  Final   Report Status PENDING  Incomplete  Urine culture     Status: None   Collection Time: 03/18/15  9:17 PM  Result Value Ref Range Status   Specimen Description URINE, CLEAN CATCH  Final   Special Requests NONE  Final   Culture NO GROWTH 1 DAY  Final   Report Status 03/20/2015 FINAL  Final  MRSA PCR Screening     Status: Abnormal   Collection Time: 03/19/15  6:01 PM  Result Value Ref Range Status   MRSA by PCR POSITIVE (A) NEGATIVE Final  Comment:        The GeneXpert MRSA Assay (FDA approved for NASAL specimens only), is one component of a comprehensive MRSA colonization surveillance program. It is not intended to diagnose MRSA infection nor to guide or monitor treatment for MRSA infections. RESULT CALLED TO, READ BACK BY AND VERIFIED WITH: SNOW,S RN 03/19/15 1938 [place]   Surgical pcr screen     Status: None   Collection Time: 03/21/15 10:00 AM  Result Value Ref Range Status   MRSA, PCR NEGATIVE NEGATIVE Final   Staphylococcus aureus NEGATIVE NEGATIVE Final    Comment:        The Xpert SA Assay (FDA approved for NASAL specimens in patients over 73 years of age), is one component of a comprehensive surveillance program.  Test performance has been validated by [facility] for patients greater than or equal to 4 year old. It is not intended to diagnose infection nor to guide or monitor treatment.   Wound culture     Status: None (Preliminary result)   Collection Time: 03/21/15 12:23 PM  Result Value Ref Range Status   Specimen Description WOUND RIGHT FOOT  Final   Special Requests NONE  Final   Gram Stain   Final    ABUNDANT WBC PRESENT,BOTH PMN AND MONONUCLEAR NO SQUAMOUS EPITHELIAL CELLS SEEN ABUNDANT GRAM POSITIVE COCCI IN PAIRS Performed at [facility]    Culture PENDING  Incomplete   Report Status PENDING  Incomplete     Studies:   Recent  x-ray studies have been reviewed in detail by the Attending Physician  Scheduled Meds:  Scheduled Meds: . allopurinol  100 mg Oral Daily  . atorvastatin  10 mg Oral QHS  . bisacodyl  10 mg Rectal Once  . Chlorhexidine Gluconate Cloth  6 each Topical Q0600  . colchicine  0.6 mg Oral QHS  . collagenase   Topical Daily  . docusate sodium  100 mg Oral BID  . furosemide  80 mg Oral Daily  . gabapentin  1,200 mg Oral BID  . heparin subcutaneous  5,000 Units Subcutaneous 3 times per day  . insulin aspart  0-15 Units Subcutaneous TID WC  . insulin aspart  0-5 Units Subcutaneous QHS  . mupirocin ointment  1 application Nasal BID  . pantoprazole  40 mg Oral Daily  . piperacillin-tazobactam (ZOSYN)  IV  3.375 g Intravenous 3 times per day  . polyethylene glycol  17 g Oral Daily  . [START ON 03/24/2015] predniSONE  20 mg Oral Q breakfast  . predniSONE  60 mg Oral Q breakfast  . senna  2 tablet Oral QHS  . sodium chloride flush  3 mL Intravenous Q12H  . vancomycin  1,000 mg Intravenous Q24H    Time spent on care of this patient: 35 mins   Maelynn Moroney T , MD   [facility] Office  [phone] Pager - Text Page per Shea Evans as per below:  On-Call/Text Page:      [email or web address]      password [email or web address]  If 7PM-7AM, please contact night-coverage [email or web address] Password [facility] 03/22/2015, 10:44 AM   LOS: 4 days

## 2015-03-22 NOTE — Progress Notes (Signed)
Pt transferred to [place]. NAD VSS No c/o pain at this time.

## 2015-03-22 NOTE — Progress Notes (Signed)
Pharmacy Antibiotic Note  Marc [name] is a 75 y.o. male admitted on 03/18/2015 with cellulitis.  Pharmacy has been consulted for vancomycin and zosyn dosing.  Day #4 of abx for sepsis from LLE cellulitis. Afeb, WBC 6 S/p debridement of great toe and amputation of second toe 2/21.  Plan: This patient's current antibiotics will be continued without adjustments.  Extended duration of abx expected, check vancomycin trough tonight.  Height: 6\' 1"  (185.4 cm) Weight: 207 lb 10.8 oz (94.2 kg) IBW/kg (Calculated) : 79.9  Temp (24hrs), Avg:98 F (36.7 C), Min:97.4 F (36.3 C), Max:98.3 F (36.8 C)   Recent Labs Lab 03/18/15 1855 03/18/15 1925 03/19/15 0053 03/19/15 0545 03/19/15 0802 03/20/15 0329 03/21/15 0814 03/22/15 0636  WBC 14.3*  --   --  13.5*  --  8.4 6.5 6.0  CREATININE 1.75*  --   --  1.58*  --  1.71* 1.78* 1.68*  LATICACIDVEN  --  2.26* 1.31 1.5 1.5  --   --   --     Estimated Creatinine Clearance: 42.9 mL/min (by C-G formula based on Cr of 1.68).    No Known Allergies  Antimicrobials this admission: Zosyn 2/18 >> Vancomycin 2/18 >>  Dose adjustments this admission:   Microbiology results: Blood cx 2/18 >>ngtd Urine cx 2/18 >>ngF 2/21 wound r foot >>GPC  Thank you for allowing pharmacy to be a part of this patient's care.  Erin Hearing PharmD., BCPS Clinical Pharmacist Pager [phone] 03/22/2015 12:12 PM

## 2015-03-22 NOTE — Progress Notes (Signed)
Inpatient Diabetes Program Recommendations  AACE/ADA: New Consensus Statement on Inpatient Glycemic Control (2015)  Target Ranges:  Prepandial:   less than 140 mg/dL      Peak postprandial:   less than 180 mg/dL (1-2 hours)      Critically ill patients:  140 - 180 mg/dL   Review of Glycemic ControlResults for Marc [name], Marc [name] (MRN [id number]) as of 03/22/2015 11:43  Ref. Range 03/21/2015 11:07 03/21/2015 13:14 03/21/2015 16:41 03/21/2015 21:38 03/22/2015 08:19  Glucose-Capillary Latest Ref Range: 65-99 mg/dL 151 (H) 174 (H) 276 (H) 311 (H) 168 (H)   Diabetes history: Type 2 diabetes Outpatient Diabetes medications: Amaryl 4 mg bid,  Current orders for Inpatient glycemic control: Amaryl 4 mg bid, Novolog moderate tid with meals and HS  Inpatient Diabetes Program Recommendations:    Note that patient is on PO Prednisone.  Amaryl restarted today by MD.  If post-prandial CBG's remain greater than 180 mg/dL, may consider adding Novolog meal coverage 4 units tid with meals while on Prednisone.  Thanks, Adah Perl, RN, BC-ADM Inpatient Diabetes Coordinator Pager [phone] (8a-5p)

## 2015-03-22 NOTE — Progress Notes (Signed)
   VASCULAR SURGERY ASSESSMENT & PLAN:  * 1 Day Post-Op s/p: Excisional debridement of right great toe amputation site and ray amputation of right second toe with placement of VAC.  *  ID: Gram stain yesterday showed abundant gram-positive cocci. The patient is on IV Zosyn and vancomycin.  * VAC change on Tuesdays Thursdays and Saturdays.  * From my standpoint, he can go home on po antibiotics once the [facility] is approved.  SUBJECTIVE: No complaints.  PHYSICAL EXAM: Filed Vitals:   03/22/15 0000 03/22/15 0341 03/22/15 0400 03/22/15 0500  BP: 114/52  89/52   Pulse: 41  35   Temp: 98.2 F (36.8 C) 97.9 F (36.6 C)    TempSrc: Oral Oral    Resp: 11  10   Height:      Weight:    207 lb 10.8 oz (94.2 kg)  SpO2: 97%  98%    VAC If good seal.  LABS: Lab Results  Component Value Date   WBC 6.0 03/22/2015   HGB 8.6* 03/22/2015   HCT 26.6* 03/22/2015   MCV 99.3 03/22/2015   PLT 212 03/22/2015   CBG (last 3)   Recent Labs  03/21/15 1314 03/21/15 1641 03/21/15 2138  GLUCAP 174* 276* 311*    Principal Problem:   Sepsis ([place]) Active Problems:   Lumbar disc disease   Type 2 diabetes mellitus with peripheral neuropathy (HCC)   Chronic kidney disease stage 3   Cellulitis   Hypotension   Sepsis, unspecified organism (HCC)   Cellulitis of right lower extremity   Chronic diastolic CHF (congestive heart failure) ([place])   Pressure ulcer   Gae Gallop Beeper: [id number] 03/22/2015

## 2015-03-22 NOTE — Care Management Note (Addendum)
Case Management Note  Patient Details  Name: Marc [name] MRN: [id number] Date of Birth: Apr 04, 1940  Subjective/Objective:         Pt adm w sepsis, debridemnt 2-21 w another toe removed           Action/Plan: lives w fam, pcp dr Arelia Sneddon   Expected Discharge Date:                  Expected Discharge Plan:  [place]  In-House Referral:     Discharge planning Services  CM Consult  Post Acute Care Choice:  Resumption of Svcs/PTA Provider Choice offered to:     DME Arranged:  Vac DME Agency:  [facility]  HH Arranged:  RN, PT Coffee Agency:  [facility]  Status of Service:     Medicare Important Message Given:  Yes Date Medicare IM Given:    Medicare IM give by:    Date Additional Medicare IM Given:    Additional Medicare Important Message give by:     If discussed at [place] of Stay Meetings, dates discussed:    Additional Comments:act w [facility] for hhrn, recent vac but had been removed and taken out of home. New vac form filled out and faxed to [facility].  Pt hopes for dc today. Will await approval and del of vac and md to decide is stable for dc.  [facility] called and vac approved. They will deliver to room at 3pm today 2-22  Percell Locus 03/22/2015, 9:32 AM

## 2015-03-23 DIAGNOSIS — I5032 Chronic diastolic (congestive) heart failure: Secondary | ICD-10-CM

## 2015-03-23 DIAGNOSIS — I9589 Other hypotension: Secondary | ICD-10-CM

## 2015-03-23 DIAGNOSIS — L8990 Pressure ulcer of unspecified site, unspecified stage: Secondary | ICD-10-CM

## 2015-03-23 DIAGNOSIS — L03115 Cellulitis of right lower limb: Secondary | ICD-10-CM

## 2015-03-23 DIAGNOSIS — A419 Sepsis, unspecified organism: Principal | ICD-10-CM

## 2015-03-23 DIAGNOSIS — E1142 Type 2 diabetes mellitus with diabetic polyneuropathy: Secondary | ICD-10-CM

## 2015-03-23 DIAGNOSIS — M5136 Other intervertebral disc degeneration, lumbar region: Secondary | ICD-10-CM

## 2015-03-23 LAB — CULTURE, BLOOD (ROUTINE X 2)
CULTURE: NO GROWTH
CULTURE: NO GROWTH

## 2015-03-23 LAB — COMPREHENSIVE METABOLIC PANEL
ALK PHOS: 48 U/L (ref 38–126)
ALT: 7 U/L — ABNORMAL LOW (ref 17–63)
ANION GAP: 8 (ref 5–15)
AST: 12 U/L — ABNORMAL LOW (ref 15–41)
Albumin: 2.2 g/dL — ABNORMAL LOW (ref 3.5–5.0)
BUN: 33 mg/dL — ABNORMAL HIGH (ref 6–20)
CALCIUM: 9 mg/dL (ref 8.9–10.3)
CHLORIDE: 112 mmol/L — AB (ref 101–111)
CO2: 22 mmol/L (ref 22–32)
Creatinine, Ser: 1.63 mg/dL — ABNORMAL HIGH (ref 0.61–1.24)
GFR calc non Af Amer: 40 mL/min — ABNORMAL LOW (ref >60)
GFR, EST AFRICAN AMERICAN: 46 mL/min — AB (ref >60)
Glucose, Bld: 191 mg/dL — ABNORMAL HIGH (ref 65–99)
Potassium: 4.6 mmol/L (ref 3.5–5.1)
SODIUM: 142 mmol/L (ref 135–145)
Total Bilirubin: 0.3 mg/dL (ref 0.3–1.2)
Total Protein: 5.7 g/dL — ABNORMAL LOW (ref 6.5–8.1)

## 2015-03-23 LAB — GLUCOSE, CAPILLARY
GLUCOSE-CAPILLARY: 173 mg/dL — AB (ref 65–99)
GLUCOSE-CAPILLARY: 303 mg/dL — AB (ref 65–99)
GLUCOSE-CAPILLARY: 228 mg/dL — AB (ref 65–99)
Glucose-Capillary: 153 mg/dL — ABNORMAL HIGH (ref 65–99)
Glucose-Capillary: 255 mg/dL — ABNORMAL HIGH (ref 65–99)

## 2015-03-23 LAB — TYPE AND SCREEN
ABO/RH(D): O NEG
ANTIBODY SCREEN: NEGATIVE
UNIT DIVISION: 0
UNIT DIVISION: 0

## 2015-03-23 LAB — CBC
HCT: 29.9 % — ABNORMAL LOW (ref 39.0–52.0)
HEMOGLOBIN: 9.5 g/dL — AB (ref 13.0–17.0)
MCH: 30.4 pg (ref 26.0–34.0)
MCHC: 31.8 g/dL (ref 30.0–36.0)
MCV: 95.8 fL (ref 78.0–100.0)
Platelets: 227 10*3/uL (ref 150–400)
RBC: 3.12 MIL/uL — AB (ref 4.22–5.81)
RDW: 15.9 % — ABNORMAL HIGH (ref 11.5–15.5)
WBC: 6 10*3/uL (ref 4.0–10.5)

## 2015-03-23 MED ORDER — ALPRAZOLAM 0.5 MG PO TABS
0.5000 mg | ORAL_TABLET | Freq: Three times a day (TID) | ORAL | Status: DC | PRN
  Administered 2015-03-23: 0.5 mg via ORAL
  Filled 2015-03-23: qty 1

## 2015-03-23 NOTE — Progress Notes (Addendum)
  [facility] Progress Note   Will come to evaluate patient's wound when WOC is changing VAC dressing.   Addendum S/p  1. Excisional debridement of right great toe amputation site 2. Ray amputation of right second toe POD 2  Wound observed with WOC RN this am. Wound bed clean with beefy red granulation tissue and good bleeding. Foam was packed in deep space. Home VAC approved yesterday. Wound cultures growing G+ cocci in pairs. Awaiting sensitivities. Ok to d/c home from vascular standpoint with po abx and home VAC.   Virgina Jock, PA-C [facility] Office: [phone] Pager: [phone] 03/23/2015 8:12 AM  Agree with above.  Deitra Mayo, MD, [place] [phone] Office: [phone]

## 2015-03-23 NOTE — Evaluation (Signed)
Occupational Therapy Evaluation Patient Details Name: Marc [name] MRN: [id number] DOB: 10-04-1940 Today's Date: 03/23/2015    History of Present Illness Pt is a 75 y/o M s/p debridement/amputation Right great toe and ray amputation 2nd toe with placement of vac.  Pt's PMH includes gout, bradycardia, ACDF, Rt TKA, anterior lat lumbar fusion, Rt fem-TBT bypass, TPT endarectomy, and Rt great toe amputation..   Clinical Impression   Pt admitted as above currently presenting with deficits impacting his ability to perform daily activities and functional mobility/transfers (Please see OT problem list below). Will follow acutely for OT to assist in maximizing independence with ADL's prior to anticipated d/c home with PRN assist from caregiver/ex-wife per pt report.     Follow Up Recommendations  No OT follow up;Supervision - Intermittent    Equipment Recommendations  None recommended by OT    Recommendations for Other Services       Precautions / Restrictions Precautions Precautions: Fall Restrictions Weight Bearing Restrictions: Yes RLE Weight Bearing: Weight bearing as tolerated      Mobility Bed Mobility Overal bed mobility: Modified Independent             General bed mobility comments: Increased time - no hands on assist  Transfers Overall transfer level: Needs assistance Equipment used: Rolling walker (2 wheeled) Transfers: Sit to/from [facility] Sit to Stand: Min guard Stand pivot transfers: Min guard       General transfer comment: Initial sit to stand was min guard assist followed by supervision only on second try. VC's for RW safety    Balance                                            ADL Overall ADL's : Needs assistance/impaired Eating/Feeding: Independent;Sitting;Bed level   Grooming: Wash/dry hands;Wash/dry face;Standing;Supervision/safety   Upper Body Bathing: Set up;Sitting   Lower Body Bathing: Min  guard;Sit to/from stand;Sitting/lateral leans   Upper Body Dressing : Set up;Sitting;Modified independent   Lower Body Dressing: Sit to/from stand;Minimal assistance   Toilet Transfer: Min guard;BSC;RW;Ambulation   Toileting- Clothing Manipulation and Hygiene: Supervision/safety;Sitting/lateral lean       Functional mobility during ADLs: Rolling walker;Min guard General ADL Comments: Pt was assessed by OT followed by ADL retraining session for bed mobility, toileting and functional mobility/transfers, and grooming standing at sink. Pt should benfit from acute OT followed by PRN intermittent assist at d/c.     Vision  No change from baseline. Does not wear glasses   Perception     Praxis      Pertinent Vitals/Pain Pain Assessment: 0-10 Pain Score: 8  Pain Location: Foot "It's 8 and going down, I had medicine" Pain Descriptors / Indicators: Constant Pain Intervention(s): Limited activity within patient's tolerance;Monitored during session;Premedicated before session     Hand Dominance Right   Extremity/Trunk Assessment Upper Extremity Assessment Upper Extremity Assessment: Overall WFL for tasks assessed   Lower Extremity Assessment Lower Extremity Assessment: Defer to PT evaluation       Communication Communication Communication: No difficulties   Cognition Arousal/Alertness: Awake/alert Behavior During Therapy: WFL for tasks assessed/performed Overall Cognitive Status: Within Functional Limits for tasks assessed                     General Comments       Exercises       Shoulder Instructions  Home Living Family/patient expects to be discharged to:: Private residence Living Arrangements: Spouse/significant other (Ex wife is caregiver) Available Help at Discharge: Friend(s);Available 24 hours/day Type of Home: Mobile home Home Access: Stairs to enter Entrance Stairs-Number of Steps: 6 Entrance Stairs-Rails: Left Home Layout:  Multi-level Alternate Level Stairs-Number of Steps: See note below under Additional Comments   Bathroom Shower/Tub: Tub/shower unit;Curtain   Bathroom Toilet: Standard     Home Equipment: Environmental consultant - 2 wheels;Cane - single point;Walker - 4 wheels;Shower seat;Adaptive equipment Adaptive Equipment: Reacher;Sock aid Additional Comments: 6 steps w/ Lt rail up onto deck, 2 steps up to mud room w/o rail, then 1 step up into home w/o rail.  3 steps down to get to wood stove w/ Rt rail going down.        Prior Functioning/Environment Level of Independence: Independent with assistive device(s)        Comments: Using RW most of the time; Mod I for ADL's per pt report    OT Diagnosis: Generalized weakness;Acute pain   OT Problem List: Decreased knowledge of use of DME or AE;Pain   OT Treatment/Interventions: Self-care/ADL training;DME and/or AE instruction;Patient/family education;Therapeutic activities    OT Goals(Current goals can be found in the care plan section) Acute Rehab OT Goals Patient Stated Goal: Go home asap Time For Goal Achievement: 04/06/15 Potential to Achieve Goals: Good ADL Goals Pt Will Perform Lower Body Dressing: with caregiver independent in assisting;with adaptive equipment;sit to/from stand;with modified independence Pt Will Transfer to Toilet: with modified independence;ambulating;bedside commode Pt Will Perform Toileting - Clothing Manipulation and hygiene: with modified independence;sitting/lateral leans;sit to/from stand Pt Will Perform Tub/Shower Transfer: Tub transfer;with supervision;with caregiver independent in assisting;ambulating;shower seat;rolling walker  OT Frequency: Min 2X/week   Barriers to D/C:            Co-evaluation              End of Session Equipment Utilized During Treatment: Gait belt;Rolling walker;Other (comment) (Wound vac R LE) Nurse Communication: Mobility status;Other (comment) (Pt sitting up EOB visiting w/ friend. Pt  also urinated on bathroom floor when sitting on commode - cleaning staff notified. )  Activity Tolerance: Patient tolerated treatment well Patient left: in bed;with call bell/phone within reach;with family/visitor present   Time: 0920-1001 OT Time Calculation (min): 41 min Charges:  OT General Charges $OT Visit: 1 Procedure OT Evaluation $OT Eval Moderate Complexity: 1 Procedure OT Treatments $Self Care/Home Management : 23-37 mins (24 min) G-Codes:    Josephine Igo Dixon, OTR/L 03/23/2015, 10:16 AM

## 2015-03-23 NOTE — Consult Note (Signed)
WOC wound consult note Reason for Consult: Consult requested for Vac dressing change.  VVS PA at bedside for first post-op dressing change. Wound type: Full thickness to right foot Measurement: 5X11X2cm with tunneling in the middle of the wound towards 3:00 o'clock to 4 cm Wound bed: Beefy red with small amt pink drainage Periwound: Intact skin surrounding Dressing procedure/placement/frequency: Pt medicated for pain prior to the procedure.  Tolerated with mod amt pain.  Applied 2 pieces black foam to cont suction at 180mm.  Plan for bedside nurse to change Q Tues/Thurs/Sat. Please re-consult if further assistance is needed.  Thank-you,  Julien Girt MSN, [place], [place], [place], [place]

## 2015-03-23 NOTE — Care Management Note (Signed)
Case Management Note  Patient Details  Name: Marc [name] MRN: [id number] Date of Birth: July 25, 1940  Subjective/Objective:                 Spoke with patient in the room. He is active w/ [facility].  Wound VAC and supplies are in room in a box. Patient lives at home and is cared for by his ex wife whom he referred to as his wife during our conversation. Patient states that he has a walker and does not need any further DME, also denies any barriers to getting his medications after discharge. Patient states that he is going to drive after discharge, despite the fact that he will have a wound vac on his right foot. Patient's ex wife does drive as well. Patient irritated with questions and assistance in making DC plan.    Action/Plan: Approved for [place] 2/2 readmissions. Nia S. CMA and [facility] notified of [facility] approval. Will precede with [place] RN for wound management.  Expected Discharge Date:                  Expected Discharge Plan:  [place]  In-House Referral:     Discharge planning Services  CM Consult  Post Acute Care Choice:  Resumption of Svcs/PTA Provider, Home Health Choice offered to:  Patient  DME Arranged:  Vac DME Agency:  [facility]  HH Arranged:  RN [place] Agency:  [facility] (Approved for [employer])  Status of Service:  Completed, signed off  Medicare Important Message Given:  Yes Date Medicare IM Given:    Medicare IM give by:    Date Additional Medicare IM Given:    Additional Medicare Important Message give by:     If discussed at [place] of Stay Meetings, dates discussed:    Additional Comments:  Carles Collet, RN 03/23/2015, 2:19 PM

## 2015-03-23 NOTE — Evaluation (Signed)
Physical Therapy Evaluation Patient Details Name: Marc [name] MRN: [id number] DOB: 08/10/1940 Today's Date: 03/23/2015   History of Present Illness  Pt is a 75 y/o M s/p debridement/amputation Right great toe and ray amputation 2nd toe with placement of vac.  Pt's PMH includes gout, bradycardia, ACDF, Rt TKA, anterior lat lumbar fusion, Rt fem-TBT bypass, TPT endarectomy, and Rt great toe amputation..  Clinical Impression  Pt doing well with mobility and no further PT needed.  Ready for dc from PT standpoint.      Follow Up Recommendations No PT follow up    Equipment Recommendations  None recommended by PT    Recommendations for Other Services       Precautions / Restrictions Precautions Precautions: Fall Required Braces or Orthoses: Other Brace/Splint Other Brace/Splint: Pt with Darco shoe for rt foot Restrictions Weight Bearing Restrictions: Yes RLE Weight Bearing: Weight bearing as tolerated      Mobility  Bed Mobility Overal bed mobility: Modified Independent             General bed mobility comments: Increased time - no hands on assist  Transfers Overall transfer level: Modified independent Equipment used: Rolling walker (2 wheeled) Transfers: Sit to/from Stand Sit to Stand: Modified independent (Device/Increase time) Stand pivot transfers: Min guard       General transfer comment: Initial sit to stand was min guard assist followed by supervision only on second try. VC's for RW safety  Ambulation/Gait Ambulation/Gait assistance: Modified independent (Device/Increase time) Ambulation Distance (Feet): 220 Feet Assistive device: Rolling walker (2 wheeled) Gait Pattern/deviations: Step-through pattern;Decreased stance time - right;Decreased step length - left Gait velocity: decr Gait velocity interpretation: Below normal speed for age/gender General Gait Details: Steady gait using walker  Stairs            Wheelchair Mobility    Modified  [place] (Stroke Patients Only)       Balance Overall balance assessment: Needs assistance Sitting-balance support: No upper extremity supported;Feet supported Sitting balance-Leahy Scale: Good     Standing balance support: No upper extremity supported;During functional activity Standing balance-Leahy Scale: Fair                               Pertinent Vitals/Pain Pain Assessment: 0-10 Pain Score: 4  Pain Location: rt foot Pain Descriptors / Indicators: Constant Pain Intervention(s): Limited activity within patient's tolerance;Monitored during session    Home Living Family/patient expects to be discharged to:: Private residence Living Arrangements: Spouse/significant other (Ex wife is caregiver) Available Help at Discharge: Friend(s);Available 24 hours/day Type of Home: Mobile home Home Access: Stairs to enter Entrance Stairs-Rails: Left Entrance Stairs-Number of Steps: 6 Home Layout: Multi-level Home Equipment: Walker - 2 wheels;Cane - single point;Walker - 4 wheels;Shower seat;Adaptive equipment Additional Comments: 6 steps w/ Lt rail up onto deck, 2 steps up to mud room w/o rail, then 1 step up into home w/o rail.  3 steps down to get to wood stove w/ Rt rail going down.      Prior Function Level of Independence: Independent with assistive device(s)         Comments: Using RW most of the time; Mod I for ADL's per pt report     Hand Dominance   Dominant Hand: Right    Extremity/Trunk Assessment   Upper Extremity Assessment: Defer to OT evaluation           Lower Extremity Assessment: Overall WFL for tasks  assessed         Communication   Communication: No difficulties  Cognition Arousal/Alertness: Awake/alert Behavior During Therapy: WFL for tasks assessed/performed Overall Cognitive Status: Within Functional Limits for tasks assessed                      General Comments      Exercises        Assessment/Plan    PT  Assessment Patent does not need any further PT services  PT Diagnosis Difficulty walking   PT Problem List    PT Treatment Interventions     PT Goals (Current goals can be found in the Care Plan section) Acute Rehab PT Goals Patient Stated Goal: Go home asap PT Goal Formulation: All assessment and education complete, DC therapy    Frequency     Barriers to discharge        Co-evaluation               End of Session   Activity Tolerance: Patient tolerated treatment well Patient left: in bed;with call bell/phone within reach;with bed alarm set (sitting EOB) Nurse Communication: Mobility status         Time: [id number] PT Time Calculation (min) (ACUTE ONLY): 20 min   Charges:   PT Evaluation $PT Eval Moderate Complexity: 1 Procedure     PT G Codes:        Kepler Mccabe 2015/04/16, 12:49 PM [facility] PT [phone]

## 2015-03-23 NOTE — Progress Notes (Signed)
PROGRESS NOTE  CHIKA WEB [id number] DOB: 11/09/1940 DOA: 03/18/2015 PCP: [name], MD  Admit HPI / Brief Narrative: 75 y.o.M Hx Depression, HTN, Bradycardia, CAD, PAD, PVD S/P Rt great toe amp and recent Rt fem-pop bypass, and DM 2 who presented with fever and leg pain. Patient admitted in early January for right toe gangrene, had R great toe amputation and R fem-pop bypass at that time. Two weeks prior to this admit pt was readmitted with hypotension, resolved with fluids, suspected to be medication-induced not septic. On the morning of this admission the patient developed redness and swelling and warmth of his right leg, as well as fever and malaise/sluggishness. The fever did not respond to acetaminophen and the pain and redness persisted and so he came to the ER. In the ED, he was febrile to 102.59F, tachycardic, and hypotensive to 80/40. K4.7, creatinine 1.7 (at baseline), WBC 14.2, lactate 2.26. A chest x-ray was normal. Urinalysis showed rare bacteria.   HPI/Subjective: Patient was irritated, was concerned about staying in the hospital. I explained to him we need to know the sensitivity of the bacteria which causing as cellulitis/Sepsis  Assessment/Plan:  Sepsis due to RLE cellulitis -Continue Piperacillin-tazobactam and Vancomycin for now -will likely need long course of antibiotics because he may have seeded his recent bypass - need to await speciation and sensitivities of his wound cx to determine specific abx and route to be administered - if this proved to be Staph, will need to consider ID input prior to d/c    Nonhealing open right great toe amputation -Care per Vasc Surgery, who has cleared for d/c home from their standpoint w/ home VAC tx  -Underwent excisional debridement of the right great toe with ray amputation of the right second toe on 2/21. -Patient will go home on wound VAC  Hypotension -cortisol 16.7 - continue prednisone but increase to  three times daily dose for three days, then resume 20mg  daily thereafter, with plan for slow taper to off   Chronic diastolic CHF  -no evidence of volume overload at this time   HTN  -not an active issue at this time   DM  -CBG quite variable, but at times as high as 300 - resume home amaryl in preparation for d/c home   Gout  -Continue daily colchicine - Reduced allopurinol to 100 daily - Uric acid 3 - denies acute flair   Low back pain, spinal stenosis  -Chronically on Prednisone 20 mg daily for his low back pain - Recommend patient taper this medication off as quickly as possible as likely interfering with wound healing  Acute on chronic anemia no obvious blood loss, ferritin, b12, and TSH wnl -transfused 1 unit PRBC on 2/20  Chronic kidney disease stage 3 baseline creatinine 1.5 - crt appears to be slowly improving - recheck in AM   Constipation Likely opioid induced constipation, start Colace, senna, MiraLAX.  MRSA screen +  Code Status: FULL Family Communication: no family present at time of exam Disposition Plan:  (may require ID input)  Consultants: Vasc Surgery   Procedures: 2/21 - excisional debridement of right great toe amputation site with ray amputation of right second toe and wound vac placement  Antibiotics: Zosyn 2/18 > Vancomycin 2/18 >  DVT prophylaxis: SQ heparin   Objective: Blood pressure 166/56, pulse 38, temperature 97.8 F (36.6 C), temperature source Oral, resp. rate 15, height 6\' 1"  (1.854 m), weight 95.3 kg (210 lb 1.6 oz), SpO2 100 %.  Intake/Output Summary (Last 24 hours) at 03/23/15 1456 Last data filed at 03/23/15 1309  Gross per 24 hour  Intake    340 ml  Output   3525 ml  Net  -3185 ml   Exam: General: No acute respiratory distress Lungs: Clear to auscultation bilaterally without wheezes or crackles Cardiovascular: Regular rate and rhythm without murmur gallop or rub normal S1 and S2 Abdomen: Nontender, nondistended,  soft, bowel sounds positive, no rebound, no ascites, no appreciable mass Extremities: No significant cyanosis, clubbing, or edema bilateral lower extremities - VAC in place on R foot   Data Reviewed:  Basic Metabolic Panel:  Recent Labs Lab 03/19/15 0545 03/20/15 0329 03/21/15 0814 03/22/15 0636 03/23/15 0613  NA 142 142 141 139 142  K 4.8 4.6 4.8 5.0 4.6  CL 115* 113* 115* 114* 112*  CO2 20* 18* 19* 20* 22  GLUCOSE 92 165* 126* 203* 191*  BUN 26* 29* 30* 30* 33*  CREATININE 1.58* 1.71* 1.78* 1.68* 1.63*  CALCIUM 8.1* 8.3* 8.8* 8.6* 9.0  MG  --  1.9  --   --   --     CBC:  Recent Labs Lab 03/18/15 1855 03/19/15 0545 03/19/15 1905 03/20/15 0329 03/21/15 0814 03/22/15 0636 03/23/15 0613  WBC 14.3* 13.5*  --  8.4 6.5 6.0 6.0  NEUTROABS 13.1*  --   --  7.1  --   --   --   HGB 8.9* 8.0*  --  7.1* 8.6* 8.6* 9.5*  HCT 28.3* 26.1* 23.5* 22.0* 28.2* 26.6* 29.9*  MCV 99.0 100.8*  --  100.9* 98.9 99.3 95.8  PLT 276 224  --  196 211 212 227    Liver Function Tests:  Recent Labs Lab 03/18/15 1855 03/19/15 0545 03/20/15 0329 03/23/15 0613  AST 15 16 12* 12*  ALT 6* 7* 7* 7*  ALKPHOS 68 55 46 48  BILITOT 0.2* 0.4 0.3 0.3  PROT 6.6 5.6* 5.3* 5.7*  ALBUMIN 3.1* 2.5* 2.2* 2.2*   CBG:  Recent Labs Lab 03/22/15 1206 03/22/15 1713 03/22/15 2114 03/23/15 0758 03/23/15 1258  GLUCAP 230* 283* 173* 153* 255*    Recent Results (from the past 240 hour(s))  Blood culture (routine x 2)     Status: None   Collection Time: 03/18/15  7:30 PM  Result Value Ref Range Status   Specimen Description BLOOD LEFT ANTECUBITAL  Final   Special Requests BOTTLES DRAWN AEROBIC AND ANAEROBIC 5CC  Final   Culture NO GROWTH 5 DAYS  Final   Report Status 03/23/2015 FINAL  Final  Blood culture (routine x 2)     Status: None   Collection Time: 03/18/15  7:40 PM  Result Value Ref Range Status   Specimen Description BLOOD RIGHT WRIST  Final   Special Requests BOTTLES DRAWN AEROBIC AND  ANAEROBIC 5CC  Final   Culture NO GROWTH 5 DAYS  Final   Report Status 03/23/2015 FINAL  Final  Urine culture     Status: None   Collection Time: 03/18/15  9:17 PM  Result Value Ref Range Status   Specimen Description URINE, CLEAN CATCH  Final   Special Requests NONE  Final   Culture NO GROWTH 1 DAY  Final   Report Status 03/20/2015 FINAL  Final  MRSA PCR Screening     Status: Abnormal   Collection Time: 03/19/15  6:01 PM  Result Value Ref Range Status   MRSA by PCR POSITIVE (A) NEGATIVE Final    Comment:  The GeneXpert MRSA Assay (FDA approved for NASAL specimens only), is one component of a comprehensive MRSA colonization surveillance program. It is not intended to diagnose MRSA infection nor to guide or monitor treatment for MRSA infections. RESULT CALLED TO, READ BACK BY AND VERIFIED WITH: SNOW,S RN 03/19/15 1938 [place]   Surgical pcr screen     Status: None   Collection Time: 03/21/15 10:00 AM  Result Value Ref Range Status   MRSA, PCR NEGATIVE NEGATIVE Final   Staphylococcus aureus NEGATIVE NEGATIVE Final    Comment:        The Xpert SA Assay (FDA approved for NASAL specimens in patients over 45 years of age), is one component of a comprehensive surveillance program.  Test performance has been validated by [facility] for patients greater than or equal to 23 year old. It is not intended to diagnose infection nor to guide or monitor treatment.   Anaerobic culture     Status: None (Preliminary result)   Collection Time: 03/21/15 12:23 PM  Result Value Ref Range Status   Specimen Description WOUND RIGHT FOOT  Final   Special Requests NONE  Final   Gram Stain PENDING  Incomplete   Culture   Final    NO ANAEROBES ISOLATED; CULTURE IN PROGRESS FOR 5 DAYS Performed at [facility]    Report Status PENDING  Incomplete  Wound culture     Status: None (Preliminary result)   Collection Time: 03/21/15 12:23 PM  Result Value Ref Range Status    Specimen Description WOUND RIGHT FOOT  Final   Special Requests NONE  Final   Gram Stain   Final    ABUNDANT WBC PRESENT,BOTH PMN AND MONONUCLEAR NO SQUAMOUS EPITHELIAL CELLS SEEN ABUNDANT GRAM POSITIVE COCCI IN PAIRS Performed at [facility]    Culture   Final    Culture reincubated for better growth Performed at [facility]    Report Status PENDING  Incomplete     Studies:   Recent x-ray studies have been reviewed in detail by the Attending Physician  Scheduled Meds:  Scheduled Meds: . allopurinol  100 mg Oral Daily  . atorvastatin  10 mg Oral QHS  . bisacodyl  10 mg Rectal Once  . Chlorhexidine Gluconate Cloth  6 each Topical Q0600  . colchicine  0.6 mg Oral QHS  . docusate sodium  100 mg Oral BID  . furosemide  80 mg Oral Daily  . gabapentin  1,200 mg Oral BID  . glimepiride  2 mg Oral BID WC  . heparin subcutaneous  5,000 Units Subcutaneous 3 times per day  . insulin aspart  0-15 Units Subcutaneous TID WC  . insulin aspart  0-5 Units Subcutaneous QHS  . mupirocin ointment  1 application Nasal BID  . pantoprazole  40 mg Oral Daily  . piperacillin-tazobactam (ZOSYN)  IV  3.375 g Intravenous 3 times per day  . polyethylene glycol  17 g Oral Daily  . [START ON 03/24/2015] predniSONE  20 mg Oral Q breakfast  . senna  2 tablet Oral QHS  . sodium chloride flush  3 mL Intravenous Q12H  . vancomycin  1,000 mg Intravenous Q24H    Time spent on care of this patient: 35 mins   Verlee Monte A , MD   [facility] Office  [phone] Pager - Text Page per Shea Evans as per below:  On-Call/Text Page:      [email or web address]      password [email or web address]  If 7PM-7AM, please contact  night-coverage [email or web address] Password [email or web address] 03/23/2015, 2:56 PM   LOS: 5 days

## 2015-03-24 ENCOUNTER — Telehealth: Payer: Self-pay | Admitting: Vascular Surgery

## 2015-03-24 LAB — GLUCOSE, CAPILLARY: Glucose-Capillary: 121 mg/dL — ABNORMAL HIGH (ref 65–99)

## 2015-03-24 MED ORDER — HYDRALAZINE HCL 20 MG/ML IJ SOLN
5.0000 mg | Freq: Once | INTRAMUSCULAR | Status: AC
  Administered 2015-03-24: 5 mg via INTRAVENOUS
  Filled 2015-03-24: qty 1

## 2015-03-24 MED ORDER — CEPHALEXIN 500 MG PO CAPS
500.0000 mg | ORAL_CAPSULE | Freq: Three times a day (TID) | ORAL | Status: DC
Start: 2015-03-24 — End: 2015-04-07

## 2015-03-24 MED ORDER — OXYCODONE-ACETAMINOPHEN 5-325 MG PO TABS: 1.0000 | ORAL_TABLET | ORAL | Status: DC | PRN

## 2015-03-24 MED ORDER — DOXYCYCLINE HYCLATE 100 MG PO TABS: 100.0000 mg | ORAL_TABLET | Freq: Two times a day (BID) | ORAL | Status: DC

## 2015-03-24 NOTE — Discharge Summary (Signed)
Physician Discharge Summary  Marc [name] [id number] DOB: 09-24-1940 DOA: 03/18/2015  PCP: Marc Downing, MD  Admit date: 03/18/2015 Discharge date: 03/24/2015  Time spent: 40 minutes  Recommendations for Outpatient Follow-up:  1. Follow up with the primary care physician within R week. 2. Follow-up with Dr. Scot [name] in 2 weeks. 3. Check CBC and BMP in 1 week.  Discharge Diagnoses:  Principal Problem:   Sepsis ([place]) Active Problems:   Lumbar disc disease   Type 2 diabetes mellitus with peripheral neuropathy (HCC)   Chronic kidney disease stage 3   Cellulitis   Hypotension   Sepsis, unspecified organism (HCC)   Cellulitis of right lower extremity   Chronic diastolic CHF (congestive heart failure) (HCC)   Pressure ulcer   Discharge Condition: Stable  Diet recommendation: Heart healthy/carbohydrate modified diet  Filed Weights   03/22/15 1851 03/23/15 0744 03/24/15 0648  Weight: 95 kg (209 lb 7 oz) 95.3 kg (210 lb 1.6 oz) 95.6 kg (210 lb 12.2 oz)    History of present illness:  Marc [name] is a 75 y.o. male with a past medical history significant for PVD s/p R great toe amp and recent R fem-pop bypass, HTN, DM, and CAD who presents with fever and leg pain.  Patient admitted in early January for right toe gangrene, had R great toe amputation and R fem-pop bypass at that time. Two weeks ago, readmitted with hypotension, resolved with fluids, suspected to be medication induced not septic.  Now, since this morning, the patient has had redness and swelling and warmth up his right leg, as well as fever and malaise/sluggishness. The fever did not respond to acetaminophen and the pain and redness persisted and so he came to the ER. There had been no increase in discharge from the wound. He denied fever, chest congestion, sore throat, dysuria, hematuria, abdominal pain.  In the ED, he was febrile to 102.92F, tachycardic, and hypotensive to 80/40. K4.7, creatinine  1.7 (at baseline), WBC 14.2, lactate 2.26. A chest x-ray was normal. Urinalysis showed rare bacteria.   Hospital Course:   Sepsis due to RLE cellulitis Seen by vascular surgery on admission, status post I&D and placement of wound VAC. Sepsis present on admission with temperature of 102.9 and WBC 14.2 and lactic acid of 2.26. Sepsis physiology resolved after initiation of antibiotics. He was on broad-spectrum antibiotics, Zosyn and vancomycin while he was in the hospital. Discharge on doxycycline and Keflex for 7 more days.  Nonhealing open right great toe amputation -Care per Vasc Surgery, who has cleared for d/c home from their standpoint w/ home VAC tx  -Underwent excisional debridement of the right great toe with ray amputation of the right second toe on 2/21. -Patient will go home on wound VAC  Hypotension -cortisol 16.7 - continue prednisone but increase to three times daily dose for three days, then resume 20mg  daily thereafter. -This is slowly tapered off, on discharge did not need steroids  Chronic diastolic CHF  -no evidence of volume overload at this time, home medications restarted at the time of discharge.   HTN  -not an active issue at this time   DM  -CBG quite variable, but at times as high as 300 - resume home amaryl in preparation for d/c home   Gout  -Continue daily colchicine - Reduced allopurinol to 100 daily - Uric acid 3 - denies acute flair   Low back pain, spinal stenosis  -Chronically on Prednisone 20 mg daily for his low back  pain - Recommend patient taper this medication off as quickly as possible as likely interfering with wound healing  Acute on chronic anemia no obvious blood loss, ferritin, b12, and TSH wnl -transfused 1 unit PRBC on 2/20. Cannot rule out anemia secondary to CKD.  Chronic kidney disease stage 3 baseline creatinine 1.5, creatinine slowly improved back to baseline at 1.6.  Constipation Likely opioid induced constipation,  start Colace, senna, MiraLAX, had a bowel movement prior to discharge.[name]  MRSA screen +  Procedures: PROCEDURE:  1. Excisional debridement of right great toe amputation site 2. Ray amputation of right second toe 3. Placement of VAC  Consultations:  Vascular surgery  Discharge Exam: Filed Vitals:   03/24/15 0525 03/24/15 0648  BP: 183/65 187/67  Pulse: 49 44  Temp: 98 F (36.7 C)   Resp: 18    General: Alert and awake, oriented x3, not in any acute distress. HEENT: anicteric sclera, pupils reactive to light and accommodation, EOMI CVS: S1-S2 clear, no murmur rubs or gallops Chest: clear to auscultation bilaterally, no wheezing, rales or rhonchi Abdomen: soft nontender, nondistended, normal bowel sounds, no organomegaly Extremities: no cyanosis, clubbing or edema noted bilaterally Neuro: Cranial nerves II-XII intact, no focal neurological deficits  Discharge Instructions   Discharge Instructions    Diet - low sodium heart healthy    Complete by:  As directed      Increase activity slowly    Complete by:  As directed           Current Discharge Medication List    START taking these medications   Details  cephALEXin (KEFLEX) 500 MG capsule Take 1 capsule (500 mg total) by mouth 3 (three) times daily. Qty: 21 capsule, Refills: 0    doxycycline (VIBRA-TABS) 100 MG tablet Take 1 tablet (100 mg total) by mouth 2 (two) times daily. Qty: 14 tablet, Refills: 0      CONTINUE these medications which have CHANGED   Details  oxyCODONE-acetaminophen (PERCOCET/ROXICET) 5-325 MG tablet Take 1 tablet by mouth every 4 (four) hours as needed for severe pain. Qty: 20 tablet, Refills: 0   Associated Diagnoses: Post-op pain      CONTINUE these medications which have NOT CHANGED   Details  acetaminophen (TYLENOL) 500 MG tablet Take 1,000 mg by mouth every 6 (six) hours as needed for fever.    allopurinol (ZYLOPRIM) 300 MG tablet Take 300 mg by mouth 2 (two) times daily.      aspirin EC 81 MG tablet Take 81 mg by mouth at bedtime.     atorvastatin (LIPITOR) 10 MG tablet Take 10 mg by mouth at bedtime.     colchicine 0.6 MG tablet Take 1 tablet (0.6 mg total) by mouth daily. Qty: 10 tablet, Refills: 0    collagenase (SANTYL) ointment Apply 1 application topically every other day.    furosemide (LASIX) 80 MG tablet Take 1 tablet (80 mg total) by mouth daily.    gabapentin (NEURONTIN) 600 MG tablet Take 1,200 mg by mouth 2 (two) times daily.     glimepiride (AMARYL) 4 MG tablet Take 4 mg by mouth 2 (two) times daily.     lisinopril (PRINIVIL,ZESTRIL) 20 MG tablet Take 1 tablet (20 mg total) by mouth daily. Qty: 30 tablet, Refills: 0    pantoprazole (PROTONIX) 40 MG tablet Take 1 tablet (40 mg total) by mouth daily. Qty: 30 tablet, Refills: 0    potassium gluconate 595 MG TABS tablet Take 595 mg by mouth at bedtime.  predniSONE (DELTASONE) 10 MG tablet Take 20 mg by mouth daily with breakfast.    clopidogrel (PLAVIX) 75 MG tablet Take 75 mg by mouth at bedtime.        No Known Allergies Follow-up Information    Follow up with Deitra Mayo, MD In 2 weeks.   Specialties:  Vascular Surgery, Cardiology   Why:  Our office will call you to arrange an appointment (sent)   Contact information:   [place] [address] [phone]       Follow up with [facility].   Why:  [place] RN. Approved for [place]. Home health will call you day of discharge to set up first home health appointment.   Contact information:   [address] [place] [address] [phone]        The results of significant diagnostics from this hospitalization (including imaging, microbiology, ancillary and laboratory) are listed below for reference.    Significant Diagnostic Studies: Dg Chest 2 View  03/18/2015  CLINICAL DATA:  Sepsis, fever, cough. EXAM: CHEST  2 VIEW COMPARISON:  03/01/2015 chest radiograph. FINDINGS: Stable  cardiomediastinal silhouette with normal heart size. No pneumothorax. No pleural effusion. Mild scarring versus atelectasis at both lung bases. No pulmonary edema or acute consolidative airspace disease. IMPRESSION: Mild bibasilar scarring versus atelectasis. Otherwise no active disease in the chest. Electronically Signed   By: Ilona Sorrel M.D.   On: 03/18/2015 20:43   Dg Chest 2 View  03/01/2015  CLINICAL DATA:  Shortness of breath. EXAM: CHEST  2 VIEW COMPARISON:  01/26/2015 FINDINGS: The cardiomediastinal contours are unchanged, heart at the upper limits normal in size. Mild bibasilar atelectasis or scarring. Pulmonary vasculature is normal. No consolidation, pleural effusion, or pneumothorax. No acute osseous abnormalities are seen. IMPRESSION: Mild bibasilar atelectasis or scarring.  Otherwise no acute process. Electronically Signed   By: Jeb Levering M.D.   On: 03/01/2015 02:09   Dg Foot Complete Right  03/18/2015  CLINICAL DATA:  75 year old male with toe amputation in January. Patient presenting with fever and right lower extremity swelling. EXAM: RIGHT FOOT COMPLETE - 3+ VIEW COMPARISON:  Radiograph dated 03/01/2015 FINDINGS: There is stable appearing proximal transmetatarsal amputation of the first digit. There is no acute fracture or dislocation. No erosive changes or evidence of periosteal reaction. The bones are osteopenic. There are degenerative changes of the hindfoot and ankle joint. There is diffuse soft tissue swelling of the midfoot and forefoot. No soft tissue gas or radiopaque foreign object identified. Dressing noted over the foot. IMPRESSION: Stable appearing amputation of the proximal aspect of the first metatarsal. No acute osseous pathology. Diffuse soft tissue swelling of the foot. Electronically Signed   By: Anner Crete M.D.   On: 03/18/2015 20:47   Dg Foot Complete Right  03/01/2015  CLINICAL DATA:  Status post great toe amputation with pain. EXAM: RIGHT FOOT COMPLETE -  3+ VIEW COMPARISON:  RIGHT foot radiograph January 16, 2015 FINDINGS: Status post interval amputation of first digit at proximal metatarsus. No destructive bony lesions. No acute fracture deformity. No dislocation. Mid and forefoot soft tissue swelling without subcutaneous gas or radiopaque foreign bodies. Moderate plantar calcaneal spur. Minimal vascular calcifications. IMPRESSION: Status post first proximal metatarsus amputation. Soft tissue swelling. Electronically Signed   By: Elon Alas M.D.   On: 03/01/2015 02:18    Microbiology: Recent Results (from the past 240 hour(s))  Blood culture (routine x 2)     Status: None  Collection Time: 03/18/15  7:30 PM  Result Value Ref Range Status   Specimen Description BLOOD LEFT ANTECUBITAL  Final   Special Requests BOTTLES DRAWN AEROBIC AND ANAEROBIC 5CC  Final   Culture NO GROWTH 5 DAYS  Final   Report Status 03/23/2015 FINAL  Final  Blood culture (routine x 2)     Status: None   Collection Time: 03/18/15  7:40 PM  Result Value Ref Range Status   Specimen Description BLOOD RIGHT WRIST  Final   Special Requests BOTTLES DRAWN AEROBIC AND ANAEROBIC 5CC  Final   Culture NO GROWTH 5 DAYS  Final   Report Status 03/23/2015 FINAL  Final  Urine culture     Status: None   Collection Time: 03/18/15  9:17 PM  Result Value Ref Range Status   Specimen Description URINE, CLEAN CATCH  Final   Special Requests NONE  Final   Culture NO GROWTH 1 DAY  Final   Report Status 03/20/2015 FINAL  Final  MRSA PCR Screening     Status: Abnormal   Collection Time: 03/19/15  6:01 PM  Result Value Ref Range Status   MRSA by PCR POSITIVE (A) NEGATIVE Final    Comment:        The GeneXpert MRSA Assay (FDA approved for NASAL specimens only), is one component of a comprehensive MRSA colonization surveillance program. It is not intended to diagnose MRSA infection nor to guide or monitor treatment for MRSA infections. RESULT CALLED TO, READ BACK BY AND  VERIFIED WITH: SNOW,S RN 03/19/15 1938 [place]   Surgical pcr screen     Status: None   Collection Time: 03/21/15 10:00 AM  Result Value Ref Range Status   MRSA, PCR NEGATIVE NEGATIVE Final   Staphylococcus aureus NEGATIVE NEGATIVE Final    Comment:        The Xpert SA Assay (FDA approved for NASAL specimens in patients over 82 years of age), is one component of a comprehensive surveillance program.  Test performance has been validated by [facility] for patients greater than or equal to 20 year old. It is not intended to diagnose infection nor to guide or monitor treatment.   Anaerobic culture     Status: None (Preliminary result)   Collection Time: 03/21/15 12:23 PM  Result Value Ref Range Status   Specimen Description WOUND RIGHT FOOT  Final   Special Requests NONE  Final   Gram Stain PENDING  Incomplete   Culture   Final    NO ANAEROBES ISOLATED; CULTURE IN PROGRESS FOR 5 DAYS Performed at [facility]    Report Status PENDING  Incomplete  Wound culture     Status: None (Preliminary result)   Collection Time: 03/21/15 12:23 PM  Result Value Ref Range Status   Specimen Description WOUND RIGHT FOOT  Final   Special Requests NONE  Final   Gram Stain   Final    ABUNDANT WBC PRESENT,BOTH PMN AND MONONUCLEAR NO SQUAMOUS EPITHELIAL CELLS SEEN ABUNDANT GRAM POSITIVE COCCI IN PAIRS Performed at [facility]    Culture   Final    MODERATE STAPHYLOCOCCUS AUREUS Note: RIFAMPIN AND GENTAMICIN SHOULD NOT BE USED AS SINGLE DRUGS FOR TREATMENT OF STAPH INFECTIONS. Performed at [facility]    Report Status PENDING  Incomplete     Labs: Basic Metabolic Panel:  Recent Labs Lab 03/19/15 0545 03/20/15 0329 03/21/15 0814 03/22/15 0636 03/23/15 0613  NA 142 142 141 139 142  K 4.8 4.6 4.8 5.0 4.6  CL 115* 113* 115* 114* 112*  CO2 20* 18* 19* 20* 22  GLUCOSE 92 165* 126* 203* 191*  BUN 26* 29* 30* 30* 33*  CREATININE 1.58* 1.71* 1.78* 1.68*  1.63*  CALCIUM 8.1* 8.3* 8.8* 8.6* 9.0  MG  --  1.9  --   --   --    Liver Function Tests:  Recent Labs Lab 03/18/15 1855 03/19/15 0545 03/20/15 0329 03/23/15 0613  AST 15 16 12* 12*  ALT 6* 7* 7* 7*  ALKPHOS 68 55 46 48  BILITOT 0.2* 0.4 0.3 0.3  PROT 6.6 5.6* 5.3* 5.7*  ALBUMIN 3.1* 2.5* 2.2* 2.2*   No results for input(s): LIPASE, AMYLASE in the last 168 hours. No results for input(s): AMMONIA in the last 168 hours. CBC:  Recent Labs Lab 03/18/15 1855 03/19/15 0545 03/19/15 1905 03/20/15 0329 03/21/15 0814 03/22/15 0636 03/23/15 0613  WBC 14.3* 13.5*  --  8.4 6.5 6.0 6.0  NEUTROABS 13.1*  --   --  7.1  --   --   --   HGB 8.9* 8.0*  --  7.1* 8.6* 8.6* 9.5*  HCT 28.3* 26.1* 23.5* 22.0* 28.2* 26.6* 29.9*  MCV 99.0 100.8*  --  100.9* 98.9 99.3 95.8  PLT 276 224  --  196 211 212 227   Cardiac Enzymes: No results for input(s): CKTOTAL, CKMB, CKMBINDEX, TROPONINI in the last 168 hours. BNP: BNP (last 3 results) No results for input(s): BNP in the last 8760 hours.  ProBNP (last 3 results) No results for input(s): PROBNP in the last 8760 hours.  CBG:  Recent Labs Lab 03/23/15 0758 03/23/15 1258 03/23/15 1707 03/23/15 2151 03/24/15 0810  GLUCAP 153* 255* 303* 228* 121*       Signed:  Verlee Monte A MD.  [facility] 03/24/2015, 10:44 AM

## 2015-03-24 NOTE — Care Management Important Message (Signed)
Important Message  Patient Details  Name: Marc [name] MRN: [id number] Date of Birth: 12-09-1940   Medicare Important Message Given:  Yes    Carles Collet, RN 03/24/2015, 11:07 AMImportant Message  Patient Details  Name: Marc [name] MRN: [id number] Date of Birth: 1940-04-29   Medicare Important Message Given:  Yes    Carles Collet, RN 03/24/2015, 11:06 AM

## 2015-03-24 NOTE — Telephone Encounter (Signed)
-----   Message from Mena Goes, RN sent at 03/23/2015  9:51 AM EST ----- Regarding: schedule   ----- Message -----    From: Alvia Grove, PA-C    Sent: 03/23/2015   9:21 AM      To: [facility]vs Charge Pool  S/p  1. Excisional debridement of right great toe amputation site 2. Ray amputation of right second toe 03/21/15  F/u with Dr. Scot Dock in 2 weeks  Thanks Maudie Mercury

## 2015-03-24 NOTE — Progress Notes (Signed)
Nsg Discharge Note  Admit Date:  03/18/2015 Discharge date: 03/24/2015   Marc [name] to be D/C'd Home with home health per MD order.  AVS completed.  Copy for chart, and copy for patient signed, and dated. Patient/caregiver able to verbalize understanding.  Discharge Medication:   Medication List    TAKE these medications        acetaminophen 500 MG tablet  Commonly known as:  TYLENOL  Take 1,000 mg by mouth every 6 (six) hours as needed for fever.     allopurinol 300 MG tablet  Commonly known as:  ZYLOPRIM  Take 300 mg by mouth 2 (two) times daily.     aspirin EC 81 MG tablet  Take 81 mg by mouth at bedtime.     atorvastatin 10 MG tablet  Commonly known as:  LIPITOR  Take 10 mg by mouth at bedtime.     cephALEXin 500 MG capsule  Commonly known as:  KEFLEX  Take 1 capsule (500 mg total) by mouth 3 (three) times daily.     clopidogrel 75 MG tablet  Commonly known as:  PLAVIX  Take 75 mg by mouth at bedtime.     colchicine 0.6 MG tablet  Take 1 tablet (0.6 mg total) by mouth daily.     doxycycline 100 MG tablet  Commonly known as:  VIBRA-TABS  Take 1 tablet (100 mg total) by mouth 2 (two) times daily.     furosemide 80 MG tablet  Commonly known as:  LASIX  Take 1 tablet (80 mg total) by mouth daily.     gabapentin 600 MG tablet  Commonly known as:  NEURONTIN  Take 1,200 mg by mouth 2 (two) times daily.     glimepiride 4 MG tablet  Commonly known as:  AMARYL  Take 4 mg by mouth 2 (two) times daily.     lisinopril 20 MG tablet  Commonly known as:  PRINIVIL,ZESTRIL  Take 1 tablet (20 mg total) by mouth daily.     oxyCODONE-acetaminophen 5-325 MG tablet  Commonly known as:  PERCOCET/ROXICET  Take 1 tablet by mouth every 4 (four) hours as needed for severe pain.     pantoprazole 40 MG tablet  Commonly known as:  PROTONIX  Take 1 tablet (40 mg total) by mouth daily.     potassium gluconate 595 (99 K) MG Tabs tablet  Take 595 mg by mouth at bedtime.      predniSONE 10 MG tablet  Commonly known as:  DELTASONE  Take 20 mg by mouth daily with breakfast.     SANTYL ointment  Generic drug:  collagenase  Apply 1 application topically every other day.        Discharge Assessment: Filed Vitals:   03/24/15 0525 03/24/15 0648  BP: 183/65 187/67  Pulse: 49 44  Temp: 98 F (36.7 C)   Resp: 18    Skin clean, dry and intact without evidence of skin break down, no evidence of skin tears noted. IV catheter discontinued intact. Site without signs and symptoms of complications - no redness or edema noted at insertion site, patient denies c/o pain - only slight tenderness at site.  Dressing with slight pressure applied.  D/c Instructions-Education: Discharge instructions given to patient/family with verbalized understanding. D/c education completed with patient/family including follow up instructions, medication list, d/c activities limitations if indicated, with other d/c instructions as indicated by MD - patient able to verbalize understanding, all questions fully answered. New wound vac applied, all supplies  packed with patient. Also, very hard to assess skin, patient very unpleasant at times and often refuses.  Patient instructed to return to ED, call 911, or call MD for any changes in condition.  Patient escorted via [place], and D/C home via private auto.  Dayle Points, RN 03/24/2015 12:37 PM

## 2015-03-24 NOTE — Telephone Encounter (Signed)
LM for pt re appt, dpm °

## 2015-03-25 LAB — WOUND CULTURE

## 2015-03-27 LAB — ANAEROBIC CULTURE

## 2015-03-28 DIAGNOSIS — L89612 Pressure ulcer of right heel, stage 2: Secondary | ICD-10-CM | POA: Diagnosis not present

## 2015-03-28 DIAGNOSIS — E1140 Type 2 diabetes mellitus with diabetic neuropathy, unspecified: Secondary | ICD-10-CM | POA: Diagnosis not present

## 2015-03-28 DIAGNOSIS — E11621 Type 2 diabetes mellitus with foot ulcer: Secondary | ICD-10-CM | POA: Diagnosis not present

## 2015-03-28 DIAGNOSIS — L97512 Non-pressure chronic ulcer of other part of right foot with fat layer exposed: Secondary | ICD-10-CM | POA: Diagnosis not present

## 2015-03-28 DIAGNOSIS — L89322 Pressure ulcer of left buttock, stage 2: Secondary | ICD-10-CM | POA: Diagnosis not present

## 2015-03-28 DIAGNOSIS — Y835 Amputation of limb(s) as the cause of abnormal reaction of the patient, or of later complication, without mention of misadventure at the time of the procedure: Secondary | ICD-10-CM | POA: Diagnosis not present

## 2015-03-28 DIAGNOSIS — M109 Gout, unspecified: Secondary | ICD-10-CM | POA: Diagnosis not present

## 2015-03-28 DIAGNOSIS — Z89421 Acquired absence of other right toe(s): Secondary | ICD-10-CM | POA: Diagnosis not present

## 2015-03-28 DIAGNOSIS — I10 Essential (primary) hypertension: Secondary | ICD-10-CM | POA: Diagnosis not present

## 2015-03-28 DIAGNOSIS — T8789 Other complications of amputation stump: Secondary | ICD-10-CM | POA: Diagnosis not present

## 2015-03-28 DIAGNOSIS — I2510 Atherosclerotic heart disease of native coronary artery without angina pectoris: Secondary | ICD-10-CM | POA: Diagnosis not present

## 2015-03-29 ENCOUNTER — Encounter (HOSPITAL_BASED_OUTPATIENT_CLINIC_OR_DEPARTMENT_OTHER): Payer: Medicare Other | Attending: Surgery

## 2015-03-29 DIAGNOSIS — B9562 Methicillin resistant Staphylococcus aureus infection as the cause of diseases classified elsewhere: Secondary | ICD-10-CM | POA: Insufficient documentation

## 2015-03-29 DIAGNOSIS — Z89411 Acquired absence of right great toe: Secondary | ICD-10-CM | POA: Insufficient documentation

## 2015-03-29 DIAGNOSIS — Y835 Amputation of limb(s) as the cause of abnormal reaction of the patient, or of later complication, without mention of misadventure at the time of the procedure: Secondary | ICD-10-CM | POA: Diagnosis not present

## 2015-03-29 DIAGNOSIS — L97512 Non-pressure chronic ulcer of other part of right foot with fat layer exposed: Secondary | ICD-10-CM | POA: Insufficient documentation

## 2015-03-29 DIAGNOSIS — L89323 Pressure ulcer of left buttock, stage 3: Secondary | ICD-10-CM | POA: Diagnosis not present

## 2015-03-29 DIAGNOSIS — E1151 Type 2 diabetes mellitus with diabetic peripheral angiopathy without gangrene: Secondary | ICD-10-CM | POA: Diagnosis not present

## 2015-03-29 DIAGNOSIS — L89610 Pressure ulcer of right heel, unstageable: Secondary | ICD-10-CM | POA: Insufficient documentation

## 2015-03-29 DIAGNOSIS — T8189XA Other complications of procedures, not elsewhere classified, initial encounter: Secondary | ICD-10-CM | POA: Insufficient documentation

## 2015-03-29 DIAGNOSIS — I10 Essential (primary) hypertension: Secondary | ICD-10-CM | POA: Insufficient documentation

## 2015-03-29 DIAGNOSIS — F17218 Nicotine dependence, cigarettes, with other nicotine-induced disorders: Secondary | ICD-10-CM | POA: Insufficient documentation

## 2015-03-29 DIAGNOSIS — E11621 Type 2 diabetes mellitus with foot ulcer: Secondary | ICD-10-CM | POA: Diagnosis not present

## 2015-03-29 DIAGNOSIS — M109 Gout, unspecified: Secondary | ICD-10-CM | POA: Insufficient documentation

## 2015-03-29 DIAGNOSIS — I2510 Atherosclerotic heart disease of native coronary artery without angina pectoris: Secondary | ICD-10-CM | POA: Insufficient documentation

## 2015-03-29 LAB — GLUCOSE, CAPILLARY
GLUCOSE-CAPILLARY: 201 mg/dL — AB (ref 65–99)
Glucose-Capillary: 163 mg/dL — ABNORMAL HIGH (ref 65–99)

## 2015-03-30 DIAGNOSIS — E11621 Type 2 diabetes mellitus with foot ulcer: Secondary | ICD-10-CM | POA: Diagnosis not present

## 2015-03-30 LAB — GLUCOSE, CAPILLARY
GLUCOSE-CAPILLARY: 182 mg/dL — AB (ref 65–99)
Glucose-Capillary: 130 mg/dL — ABNORMAL HIGH (ref 65–99)

## 2015-03-31 DIAGNOSIS — E11621 Type 2 diabetes mellitus with foot ulcer: Secondary | ICD-10-CM | POA: Diagnosis not present

## 2015-03-31 LAB — GLUCOSE, CAPILLARY
GLUCOSE-CAPILLARY: 167 mg/dL — AB (ref 65–99)
Glucose-Capillary: 173 mg/dL — ABNORMAL HIGH (ref 65–99)

## 2015-04-03 ENCOUNTER — Encounter: Payer: Self-pay | Admitting: Vascular Surgery

## 2015-04-03 DIAGNOSIS — E11621 Type 2 diabetes mellitus with foot ulcer: Secondary | ICD-10-CM | POA: Diagnosis not present

## 2015-04-03 LAB — GLUCOSE, CAPILLARY
Glucose-Capillary: 178 mg/dL — ABNORMAL HIGH (ref 65–99)
Glucose-Capillary: 198 mg/dL — ABNORMAL HIGH (ref 65–99)

## 2015-04-04 ENCOUNTER — Telehealth: Payer: Self-pay | Admitting: Vascular Surgery

## 2015-04-04 DIAGNOSIS — E11621 Type 2 diabetes mellitus with foot ulcer: Secondary | ICD-10-CM | POA: Diagnosis not present

## 2015-04-04 LAB — GLUCOSE, CAPILLARY
GLUCOSE-CAPILLARY: 248 mg/dL — AB (ref 65–99)
GLUCOSE-CAPILLARY: 243 mg/dL — AB (ref 65–99)

## 2015-04-04 NOTE — Telephone Encounter (Signed)
Patient called on 04/03/15 and LM with appt desk that he has a conflict for Friday and needed to r.s/  When I called back this morning at 10:21am, he did not answer and his VM is full. I have cancelled the Friday appt and will wait for his return call to r.s. dpm

## 2015-04-05 DIAGNOSIS — E11621 Type 2 diabetes mellitus with foot ulcer: Secondary | ICD-10-CM | POA: Diagnosis not present

## 2015-04-05 LAB — GLUCOSE, CAPILLARY
GLUCOSE-CAPILLARY: 270 mg/dL — AB (ref 65–99)
GLUCOSE-CAPILLARY: 239 mg/dL — AB (ref 65–99)

## 2015-04-06 DIAGNOSIS — E11621 Type 2 diabetes mellitus with foot ulcer: Secondary | ICD-10-CM | POA: Diagnosis not present

## 2015-04-07 ENCOUNTER — Telehealth: Payer: Self-pay

## 2015-04-07 ENCOUNTER — Encounter: Payer: Medicare Other | Admitting: Vascular Surgery

## 2015-04-07 ENCOUNTER — Ambulatory Visit (INDEPENDENT_AMBULATORY_CARE_PROVIDER_SITE_OTHER): Payer: Medicare Other | Admitting: Family

## 2015-04-07 ENCOUNTER — Encounter: Payer: Self-pay | Admitting: Family

## 2015-04-07 VITALS — BP 164/88 | HR 89 | Temp 97.4°F | Resp 16 | Ht 73.0 in | Wt 179.0 lb

## 2015-04-07 DIAGNOSIS — G8918 Other acute postprocedural pain: Secondary | ICD-10-CM

## 2015-04-07 DIAGNOSIS — Z720 Tobacco use: Secondary | ICD-10-CM

## 2015-04-07 DIAGNOSIS — Z89411 Acquired absence of right great toe: Secondary | ICD-10-CM

## 2015-04-07 DIAGNOSIS — I779 Disorder of arteries and arterioles, unspecified: Secondary | ICD-10-CM

## 2015-04-07 DIAGNOSIS — E11621 Type 2 diabetes mellitus with foot ulcer: Secondary | ICD-10-CM | POA: Diagnosis not present

## 2015-04-07 DIAGNOSIS — S98111A Complete traumatic amputation of right great toe, initial encounter: Secondary | ICD-10-CM

## 2015-04-07 DIAGNOSIS — Z95828 Presence of other vascular implants and grafts: Secondary | ICD-10-CM

## 2015-04-07 DIAGNOSIS — F17200 Nicotine dependence, unspecified, uncomplicated: Secondary | ICD-10-CM

## 2015-04-07 LAB — GLUCOSE, CAPILLARY
GLUCOSE-CAPILLARY: 215 mg/dL — AB (ref 65–99)
Glucose-Capillary: 193 mg/dL — ABNORMAL HIGH (ref 65–99)
Glucose-Capillary: 168 mg/dL — ABNORMAL HIGH (ref 65–99)
Glucose-Capillary: 236 mg/dL — ABNORMAL HIGH (ref 65–99)

## 2015-04-07 MED ORDER — OXYCODONE-ACETAMINOPHEN 5-325 MG PO TABS: 1.0000 | ORAL_TABLET | ORAL | Status: DC | PRN

## 2015-04-07 NOTE — Patient Instructions (Signed)
Peripheral Vascular Disease Peripheral vascular disease (PVD) is a disease of the blood vessels that are not part of your heart and brain. A simple term for PVD is poor circulation. In most cases, PVD narrows the blood vessels that carry blood from your heart to the rest of your body. This can result in a decreased supply of blood to your arms, legs, and internal organs, like your stomach or kidneys. However, it most often affects a person's lower legs and feet. There are two types of PVD.  Organic PVD. This is the more common type. It is caused by damage to the structure of blood vessels.  Functional PVD. This is caused by conditions that make blood vessels contract and tighten (spasm). Without treatment, PVD tends to get worse over time. PVD can also lead to acute ischemic limb. This is when an arm or limb suddenly has trouble getting enough blood. This is a medical emergency. CAUSES Each type of PVD has many different causes. The most common cause of PVD is buildup of a fatty material (plaque) inside of your arteries (atherosclerosis). Small amounts of plaque can break off from the walls of the blood vessels and become lodged in a smaller artery. This blocks blood flow and can cause acute ischemic limb. Other common causes of PVD include:  Blood clots that form inside of blood vessels.  Injuries to blood vessels.  Diseases that cause inflammation of blood vessels or cause blood vessel spasms.  Health behaviors and health history that increase your risk of developing PVD. RISK FACTORS  You may have a greater risk of PVD if you:  Have a family history of PVD.  Have certain medical conditions, including:  High cholesterol.  Diabetes.  High blood pressure (hypertension).  Coronary heart disease.  Past problems with blood clots.  Past injury, such as burns or a broken bone. These may have damaged blood vessels in your limbs.  Buerger disease. This is caused by inflamed blood  vessels in your hands and feet.  Some forms of arthritis.  Rare birth defects that affect the arteries in your legs.  Use tobacco.  Do not get enough exercise.  Are obese.  Are age 50 or older. SIGNS AND SYMPTOMS  PVD may cause many different symptoms. Your symptoms depend on what part of your body is not getting enough blood. Some common signs and symptoms include:  Cramps in your lower legs. This may be a symptom of poor leg circulation (claudication).  Pain and weakness in your legs while you are physically active that goes away when you rest (intermittent claudication).  Leg pain when at rest.  Leg numbness, tingling, or weakness.  Coldness in a leg or foot, especially when compared with the other leg.  Skin or hair changes. These can include:  Hair loss.  Shiny skin.  Pale or bluish skin.  Thick toenails.  Inability to get or maintain an erection (erectile dysfunction). People with PVD are more prone to developing ulcers and sores on their toes, feet, or legs. These may take longer than normal to heal. DIAGNOSIS Your health care provider may diagnose PVD from your signs and symptoms. The health care provider will also do a physical exam. You may have tests to find out what is causing your PVD and determine its severity. Tests may include:  Blood pressure recordings from your arms and legs and measurements of the strength of your pulses (pulse volume recordings).  Imaging studies using sound waves to take pictures of   the blood flow through your blood vessels (Doppler ultrasound).  Injecting a dye into your blood vessels before having imaging studies using:  X-rays (angiogram or arteriogram).  Computer-generated X-rays (CT angiogram).  A powerful electromagnetic field and a computer (magnetic resonance angiogram or MRA). TREATMENT Treatment for PVD depends on the cause of your condition and the severity of your symptoms. It also depends on your age. Underlying  causes need to be treated and controlled. These include long-lasting (chronic) conditions, such as diabetes, high cholesterol, and high blood pressure. You may need to first try making lifestyle changes and taking medicines. Surgery may be needed if these do not work. Lifestyle changes may include:  Quitting smoking.  Exercising regularly.  Following a low-fat, low-cholesterol diet. Medicines may include:  Blood thinners to prevent blood clots.  Medicines to improve blood flow.  Medicines to improve your blood cholesterol levels. Surgical procedures may include:  A procedure that uses an inflated balloon to open a blocked artery and improve blood flow (angioplasty).  A procedure to put in a tube (stent) to keep a blocked artery open (stent implant).  Surgery to reroute blood flow around a blocked artery (peripheral bypass surgery).  Surgery to remove dead tissue from an infected wound on the affected limb.  Amputation. This is surgical removal of the affected limb. This may be necessary in cases of acute ischemic limb that are not improved through medical or surgical treatments. HOME CARE INSTRUCTIONS  Take medicines only as directed by your health care provider.  Do not use any tobacco products, including cigarettes, chewing tobacco, or electronic cigarettes. If you need help quitting, ask your health care provider.  Lose weight if you are overweight, and maintain a healthy weight as directed by your health care provider.  Eat a diet that is low in fat and cholesterol. If you need help, ask your health care provider.  Exercise regularly. Ask your health care provider to suggest some good activities for you.  Use compression stockings or other mechanical devices as directed by your health care provider.  Take good care of your feet.  Wear comfortable shoes that fit well.  Check your feet often for any cuts or sores. SEEK MEDICAL CARE IF:  You have cramps in your legs  while walking.  You have leg pain when you are at rest.  You have coldness in a leg or foot.  Your skin changes.  You have erectile dysfunction.  You have cuts or sores on your feet that are not healing. SEEK IMMEDIATE MEDICAL CARE IF:  Your arm or leg turns cold and blue.  Your arms or legs become red, warm, swollen, painful, or numb.  You have chest pain or trouble breathing.  You suddenly have weakness in your face, arm, or leg.  You become very confused or lose the ability to speak.  You suddenly have a very bad headache or lose your vision.   This information is not intended to replace advice given to you by your health care provider. Make sure you discuss any questions you have with your health care provider.   Document Released: 02/22/2004 Document Revised: 02/04/2014 Document Reviewed: 06/24/2013 Elsevier Interactive Patient Education 2016 [employer] Inc.  

## 2015-04-07 NOTE — Progress Notes (Signed)
Postoperative Visit   History of Present Illness  Marc [name] is a 75 y.o. male patient of Dr. Scot Dock who was admitted to the hospital on 03/01/2015 for hypotension and we were called to re evaluate his right foot wound. This is a 75 year old gentleman who presented with an extensive wound of his right great toe involving the metatarsal head. He underwent a right femoral to tibial peroneal trunk bypass with a vein graft, endarterectomy of the tibial peroneal trunk with vein patch angioplasty. 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 He has had some bleeding issues with this. He has had some swelling in the right leg and when he was seen in the office on 02/22/2015, he had a venous duplex scan which showed no evidence of DVT. He had excellent Doppler signals in his right foot with a patent bypass graft.  He is s/p right foot wound debridement on 03/21/2015 by Dr. Scot Dock.  Dr. Scot Dock wrote for dressing changes with hydrogel which should prevent the dressing from sticking which might cause further bleeding. He was discharged home with  [facility] wound care. Pt states he attends the wound care center at [facility] for the hyperbaric chamber.   He is here today for prescription analgesic refill, c/o pain at toe amputation site with dressing changes.  Oxycodone 5/325 was prescribed for him on 03/24/15 by [facility], [facility], one tablet po every 4 hours prn pain, disp #20.  Pt states he has no pain in his right foot now and for the most part has no pain in his right foot, but does have severe pain during and after right foot dressing is changed by [facility] nurse from [facility]. Pt states he has some Hydrogel but this is not used on his right foot ray amputation site dressing changes by [facility] nurse. A wound vac dressing is used with a wound vac. Pt states he has been taking one tablet oxycodone/APAP about 30-60 minutes before the dressing change.  He was taking a 7 day course of doxycycline  prescribed on 03/24/15.   He is taking a stool softener daily and is not having constipation with use of oxycodone.  He denies fever or chills.   Review of records: his A1C on 03/19/15 was 7.6 He continues to smoke.  Other medical problems include has Hypertensive heart disease; Lumbar disc disease; Hx of peptic ulcer; Hx of adenomatous colonic polyps; Bradycardia: Normal condition for this patient. Previously worked up.; Tobacco abuse disorder; Spinal stenosis of lumbar region; Type 2 diabetes mellitus with peripheral neuropathy ([place]); Gout; Hyperlipidemia; GERD (gastroesophageal reflux disease); Peripheral vascular disease with claudication ([place]); Chronic kidney disease stage 3; Mass of lower lobe of left lung; CAD (coronary artery disease), native coronary artery; Ascending aortic aneurysm ([place]); Cellulitis; Acute renal failure superimposed on stage 3 chronic kidney disease (HCC); SIRS (systemic inflammatory response syndrome) ([place]); Acute on chronic kidney failure ([place]); Type 2 diabetes mellitus with right diabetic foot infection ([place]); Atherosclerosis of native arteries of the extremities with gangrene ([place]); Extremity atherosclerosis with gangrene ([place]); Atherosclerosis of native arteries of extremities with gangrene, right leg ([place]); Hypotension; Renal failure (ARF), acute on chronic ([place]); Chronic anemia; and Type 2 diabetes mellitus with renal complication (HCC) on his problem list.   The patient's right foot wound has improved with the use of wound vac dressing changes and wound care at the wound care clinic at [facility]..     For VQI Use Only  PRE-ADM LIVING:  Home  AMB STATUS: Ambulatory with Assistance (cane)  Physical Examination  Filed Vitals:   04/07/15 1221 04/07/15 1224  BP: 166/90 164/88  Pulse: 88 89  Temp: 97.4 F (36.3 C)   TempSrc: Oral   Resp: 16   Height: 6\' 1"  (1.854 m)   Weight: 179 lb (81.194 kg)   SpO2: 98%    Body mass index is 23.62 kg/(m^2).   Right foot  ray amputation site has viable appearing red tissue, bone, and fascia, is moist, no drainage, no foul odor, no signs of necrosis.  Bilateral pedal pulses with strong DP and PT Doppler signals.   Medical Decision Making  YASH LASSMAN is a 75 y.o. male who presents s/p right foot wound debridement on 03/21/2015,  right femoral to tibial peroneal trunk bypass with a vein graft, endarterectomy of the tibial peroneal trunk with vein patch angioplasty. 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   The patient's right foot Ray amputation site has viable appearing tissue, no signs of necrosis.  Continue wound care at [facility] and wound vac by [facility] nurse, but [place] needs to use Hydrogel on the wound to minimize pt pain with dressing change, and as prescribed by Dr. Scot Dock on 03/15/15.  I reordered oxycodone/APAP 5/325, 1 tablet po every 4 hours prn pain, disp #20, 0 refills.   His atherosclerotic risk factors include almost in control DM and continued smoking.   Follow up on 04/13/15 as already scheduled with Dr. Scot Dock.   Lexii Walsh, Sharmon Leyden, RN, MSN, FNP-C [facility] Office: [phone]  04/07/2015, 12:39 PM  Clinic MD: Bridgett Larsson

## 2015-04-07 NOTE — Telephone Encounter (Signed)
Pt's wife called to inquire about refilling antibiotic and refill on pain medication.  Denied any signs of infection of right great toe amp site.  Reported pt. is undergoing Hyperbaric Oxygen Treatment at the [facility].  Stated he was in a lot of pain last night, and has increased c/o pain with dressing changes.  Reported he will be at [facility] until 11:30 AM today.  Advised of appt. @ 12:15 PM today with NP; agrees with plan.

## 2015-04-10 ENCOUNTER — Encounter: Payer: Self-pay | Admitting: Vascular Surgery

## 2015-04-10 DIAGNOSIS — E11621 Type 2 diabetes mellitus with foot ulcer: Secondary | ICD-10-CM | POA: Diagnosis not present

## 2015-04-11 DIAGNOSIS — E11621 Type 2 diabetes mellitus with foot ulcer: Secondary | ICD-10-CM | POA: Diagnosis not present

## 2015-04-11 LAB — GLUCOSE, CAPILLARY
Glucose-Capillary: 207 mg/dL — ABNORMAL HIGH (ref 65–99)
Glucose-Capillary: 252 mg/dL — ABNORMAL HIGH (ref 65–99)

## 2015-04-12 DIAGNOSIS — E11621 Type 2 diabetes mellitus with foot ulcer: Secondary | ICD-10-CM | POA: Diagnosis not present

## 2015-04-12 LAB — GLUCOSE, CAPILLARY
GLUCOSE-CAPILLARY: 341 mg/dL — AB (ref 65–99)
GLUCOSE-CAPILLARY: 353 mg/dL — AB (ref 65–99)
Glucose-Capillary: 294 mg/dL — ABNORMAL HIGH (ref 65–99)
Glucose-Capillary: 331 mg/dL — ABNORMAL HIGH (ref 65–99)

## 2015-04-13 ENCOUNTER — Encounter (HOSPITAL_COMMUNITY): Payer: Self-pay | Admitting: Internal Medicine

## 2015-04-13 ENCOUNTER — Encounter: Payer: Medicare Other | Admitting: Vascular Surgery

## 2015-04-13 ENCOUNTER — Inpatient Hospital Stay (HOSPITAL_COMMUNITY)
Admission: AD | Admit: 2015-04-13 | Discharge: 2015-04-18 | DRG: 467 | Disposition: A | Discharge status: Home Health | Payer: Medicare Other | Source: Ambulatory Visit | Attending: Family Medicine | Admitting: Family Medicine

## 2015-04-13 ENCOUNTER — Other Ambulatory Visit: Payer: Self-pay | Admitting: Orthopedic Surgery

## 2015-04-13 DIAGNOSIS — N179 Acute kidney failure, unspecified: Secondary | ICD-10-CM

## 2015-04-13 DIAGNOSIS — Z833 Family history of diabetes mellitus: Secondary | ICD-10-CM | POA: Diagnosis not present

## 2015-04-13 DIAGNOSIS — D62 Acute posthemorrhagic anemia: Secondary | ICD-10-CM | POA: Diagnosis not present

## 2015-04-13 DIAGNOSIS — I878 Other specified disorders of veins: Secondary | ICD-10-CM | POA: Diagnosis present

## 2015-04-13 DIAGNOSIS — K219 Gastro-esophageal reflux disease without esophagitis: Secondary | ICD-10-CM | POA: Diagnosis present

## 2015-04-13 DIAGNOSIS — Z6835 Body mass index (BMI) 35.0-35.9, adult: Secondary | ICD-10-CM

## 2015-04-13 DIAGNOSIS — Z8614 Personal history of Methicillin resistant Staphylococcus aureus infection: Secondary | ICD-10-CM | POA: Diagnosis not present

## 2015-04-13 DIAGNOSIS — Y831 Surgical operation with implant of artificial internal device as the cause of abnormal reaction of the patient, or of later complication, without mention of misadventure at the time of the procedure: Secondary | ICD-10-CM | POA: Diagnosis present

## 2015-04-13 DIAGNOSIS — Z89421 Acquired absence of other right toe(s): Secondary | ICD-10-CM

## 2015-04-13 DIAGNOSIS — Z89411 Acquired absence of right great toe: Secondary | ICD-10-CM

## 2015-04-13 DIAGNOSIS — I5032 Chronic diastolic (congestive) heart failure: Secondary | ICD-10-CM | POA: Diagnosis present

## 2015-04-13 DIAGNOSIS — Z95820 Peripheral vascular angioplasty status with implants and grafts: Secondary | ICD-10-CM | POA: Diagnosis not present

## 2015-04-13 DIAGNOSIS — E1152 Type 2 diabetes mellitus with diabetic peripheral angiopathy with gangrene: Secondary | ICD-10-CM | POA: Diagnosis present

## 2015-04-13 DIAGNOSIS — Z8711 Personal history of peptic ulcer disease: Secondary | ICD-10-CM | POA: Diagnosis not present

## 2015-04-13 DIAGNOSIS — Y838 Other surgical procedures as the cause of abnormal reaction of the patient, or of later complication, without mention of misadventure at the time of the procedure: Secondary | ICD-10-CM | POA: Diagnosis not present

## 2015-04-13 DIAGNOSIS — E1142 Type 2 diabetes mellitus with diabetic polyneuropathy: Secondary | ICD-10-CM | POA: Diagnosis present

## 2015-04-13 DIAGNOSIS — M009 Pyogenic arthritis, unspecified: Secondary | ICD-10-CM | POA: Diagnosis present

## 2015-04-13 DIAGNOSIS — L97519 Non-pressure chronic ulcer of other part of right foot with unspecified severity: Secondary | ICD-10-CM | POA: Diagnosis present

## 2015-04-13 DIAGNOSIS — M4806 Spinal stenosis, lumbar region: Secondary | ICD-10-CM | POA: Diagnosis present

## 2015-04-13 DIAGNOSIS — T8459XA Infection and inflammatory reaction due to other internal joint prosthesis, initial encounter: Secondary | ICD-10-CM

## 2015-04-13 DIAGNOSIS — I2510 Atherosclerotic heart disease of native coronary artery without angina pectoris: Secondary | ICD-10-CM | POA: Diagnosis not present

## 2015-04-13 DIAGNOSIS — Z7984 Long term (current) use of oral hypoglycemic drugs: Secondary | ICD-10-CM | POA: Diagnosis not present

## 2015-04-13 DIAGNOSIS — Z79899 Other long term (current) drug therapy: Secondary | ICD-10-CM | POA: Diagnosis not present

## 2015-04-13 DIAGNOSIS — Z66 Do not resuscitate: Secondary | ICD-10-CM | POA: Diagnosis present

## 2015-04-13 DIAGNOSIS — E860 Dehydration: Secondary | ICD-10-CM | POA: Diagnosis present

## 2015-04-13 DIAGNOSIS — Z96659 Presence of unspecified artificial knee joint: Secondary | ICD-10-CM

## 2015-04-13 DIAGNOSIS — T8453XA Infection and inflammatory reaction due to internal right knee prosthesis, initial encounter: Secondary | ICD-10-CM | POA: Diagnosis present

## 2015-04-13 DIAGNOSIS — F329 Major depressive disorder, single episode, unspecified: Secondary | ICD-10-CM | POA: Diagnosis present

## 2015-04-13 DIAGNOSIS — N183 Chronic kidney disease, stage 3 unspecified: Secondary | ICD-10-CM | POA: Diagnosis present

## 2015-04-13 DIAGNOSIS — B9689 Other specified bacterial agents as the cause of diseases classified elsewhere: Secondary | ICD-10-CM | POA: Diagnosis not present

## 2015-04-13 DIAGNOSIS — T560X1S Toxic effect of lead and its compounds, accidental (unintentional), sequela: Secondary | ICD-10-CM | POA: Diagnosis not present

## 2015-04-13 DIAGNOSIS — I130 Hypertensive heart and chronic kidney disease with heart failure and stage 1 through stage 4 chronic kidney disease, or unspecified chronic kidney disease: Secondary | ICD-10-CM | POA: Diagnosis present

## 2015-04-13 DIAGNOSIS — R911 Solitary pulmonary nodule: Secondary | ICD-10-CM | POA: Diagnosis present

## 2015-04-13 DIAGNOSIS — M109 Gout, unspecified: Secondary | ICD-10-CM | POA: Diagnosis present

## 2015-04-13 DIAGNOSIS — Z981 Arthrodesis status: Secondary | ICD-10-CM | POA: Diagnosis not present

## 2015-04-13 DIAGNOSIS — E875 Hyperkalemia: Secondary | ICD-10-CM | POA: Diagnosis not present

## 2015-04-13 DIAGNOSIS — I739 Peripheral vascular disease, unspecified: Secondary | ICD-10-CM | POA: Diagnosis present

## 2015-04-13 DIAGNOSIS — T8450XD Infection and inflammatory reaction due to unspecified internal joint prosthesis, subsequent encounter: Secondary | ICD-10-CM | POA: Diagnosis not present

## 2015-04-13 DIAGNOSIS — Z7952 Long term (current) use of systemic steroids: Secondary | ICD-10-CM

## 2015-04-13 DIAGNOSIS — E785 Hyperlipidemia, unspecified: Secondary | ICD-10-CM | POA: Diagnosis present

## 2015-04-13 DIAGNOSIS — Z9081 Acquired absence of spleen: Secondary | ICD-10-CM

## 2015-04-13 DIAGNOSIS — Z800 Family history of malignant neoplasm of digestive organs: Secondary | ICD-10-CM

## 2015-04-13 DIAGNOSIS — F17210 Nicotine dependence, cigarettes, uncomplicated: Secondary | ICD-10-CM | POA: Diagnosis present

## 2015-04-13 DIAGNOSIS — Z7982 Long term (current) use of aspirin: Secondary | ICD-10-CM | POA: Diagnosis not present

## 2015-04-13 DIAGNOSIS — G8918 Other acute postprocedural pain: Secondary | ICD-10-CM

## 2015-04-13 DIAGNOSIS — Z8249 Family history of ischemic heart disease and other diseases of the circulatory system: Secondary | ICD-10-CM

## 2015-04-13 DIAGNOSIS — L089 Local infection of the skin and subcutaneous tissue, unspecified: Secondary | ICD-10-CM | POA: Diagnosis present

## 2015-04-13 DIAGNOSIS — M00861 Arthritis due to other bacteria, right knee: Secondary | ICD-10-CM | POA: Diagnosis not present

## 2015-04-13 DIAGNOSIS — T8453XD Infection and inflammatory reaction due to internal right knee prosthesis, subsequent encounter: Secondary | ICD-10-CM | POA: Diagnosis not present

## 2015-04-13 DIAGNOSIS — M48061 Spinal stenosis, lumbar region without neurogenic claudication: Secondary | ICD-10-CM | POA: Diagnosis present

## 2015-04-13 DIAGNOSIS — I712 Thoracic aortic aneurysm, without rupture: Secondary | ICD-10-CM | POA: Diagnosis not present

## 2015-04-13 DIAGNOSIS — Z7902 Long term (current) use of antithrombotics/antiplatelets: Secondary | ICD-10-CM

## 2015-04-13 DIAGNOSIS — E1122 Type 2 diabetes mellitus with diabetic chronic kidney disease: Secondary | ICD-10-CM | POA: Diagnosis present

## 2015-04-13 DIAGNOSIS — M25561 Pain in right knee: Secondary | ICD-10-CM | POA: Diagnosis present

## 2015-04-13 DIAGNOSIS — Z720 Tobacco use: Secondary | ICD-10-CM | POA: Diagnosis present

## 2015-04-13 DIAGNOSIS — I7121 Aneurysm of the ascending aorta, without rupture: Secondary | ICD-10-CM | POA: Diagnosis present

## 2015-04-13 HISTORY — DX: Chronic kidney disease, stage 3 unspecified: N18.30

## 2015-04-13 HISTORY — DX: Chronic kidney disease, stage 3 (moderate): N18.3

## 2015-04-13 LAB — BASIC METABOLIC PANEL
Anion gap: 14 (ref 5–15)
BUN: 47 mg/dL — AB (ref 6–20)
CO2: 28 mmol/L (ref 22–32)
CREATININE: 2.15 mg/dL — AB (ref 0.61–1.24)
Calcium: 9.8 mg/dL (ref 8.9–10.3)
Chloride: 95 mmol/L — ABNORMAL LOW (ref 101–111)
GFR calc Af Amer: 33 mL/min — ABNORMAL LOW (ref >60)
GFR, EST NON AFRICAN AMERICAN: 28 mL/min — AB (ref >60)
GLUCOSE: 276 mg/dL — AB (ref 65–99)
POTASSIUM: 4.3 mmol/L (ref 3.5–5.1)
SODIUM: 137 mmol/L (ref 135–145)

## 2015-04-13 LAB — CBC WITH DIFFERENTIAL/PLATELET
BASOS ABS: 0 10*3/uL (ref 0.0–0.1)
BASOS PCT: 0 %
EOS ABS: 0.1 10*3/uL (ref 0.0–0.7)
Eosinophils Relative: 1 %
HCT: 35 % — ABNORMAL LOW (ref 39.0–52.0)
Hemoglobin: 11.2 g/dL — ABNORMAL LOW (ref 13.0–17.0)
Lymphocytes Relative: 22 %
Lymphs Abs: 2.3 10*3/uL (ref 0.7–4.0)
MCH: 30.9 pg (ref 26.0–34.0)
MCHC: 32 g/dL (ref 30.0–36.0)
MCV: 96.7 fL (ref 78.0–100.0)
Monocytes Absolute: 0.5 10*3/uL (ref 0.1–1.0)
Monocytes Relative: 5 %
NEUTROS ABS: 7.7 10*3/uL (ref 1.7–7.7)
NEUTROS PCT: 72 %
PLATELETS: 277 10*3/uL (ref 150–400)
RBC: 3.62 MIL/uL — AB (ref 4.22–5.81)
RDW: 14.8 % (ref 11.5–15.5)
WBC: 10.6 10*3/uL — AB (ref 4.0–10.5)

## 2015-04-13 LAB — PROTIME-INR
INR: 1.04 (ref 0.00–1.49)
Prothrombin Time: 13.8 seconds (ref 11.6–15.2)

## 2015-04-13 LAB — APTT: aPTT: 34 seconds (ref 24–37)

## 2015-04-13 LAB — PROCALCITONIN: Procalcitonin: 0.13 ng/mL

## 2015-04-13 LAB — LACTIC ACID, PLASMA: LACTIC ACID, VENOUS: 1.7 mmol/L (ref 0.5–2.0)

## 2015-04-13 LAB — GLUCOSE, CAPILLARY
GLUCOSE-CAPILLARY: 282 mg/dL — AB (ref 65–99)
Glucose-Capillary: 185 mg/dL — ABNORMAL HIGH (ref 65–99)

## 2015-04-13 MED ORDER — VANCOMYCIN HCL 10 G IV SOLR
1250.0000 mg | INTRAVENOUS | Status: DC
  Administered 2015-04-14 – 2015-04-18 (×5): 1250 mg via INTRAVENOUS
  Filled 2015-04-13 (×5): qty 1250

## 2015-04-13 MED ORDER — ONDANSETRON HCL 4 MG/2ML IJ SOLN: 4.0000 mg | Freq: Three times a day (TID) | INTRAMUSCULAR | Status: DC | PRN

## 2015-04-13 MED ORDER — NICOTINE 21 MG/24HR TD PT24
21.0000 mg | MEDICATED_PATCH | Freq: Every day | TRANSDERMAL | Status: DC
  Filled 2015-04-13 (×2): qty 1

## 2015-04-13 MED ORDER — ACETAMINOPHEN 650 MG RE SUPP: 650.0000 mg | Freq: Four times a day (QID) | RECTAL | Status: DC | PRN

## 2015-04-13 MED ORDER — COLLAGENASE 250 UNIT/GM EX OINT
1.0000 "application " | TOPICAL_OINTMENT | CUTANEOUS | Status: DC
  Administered 2015-04-15 – 2015-04-17 (×2): 1 via TOPICAL
  Filled 2015-04-13 (×2): qty 30

## 2015-04-13 MED ORDER — ACETAMINOPHEN 325 MG PO TABS: 650.0000 mg | ORAL_TABLET | Freq: Four times a day (QID) | ORAL | Status: DC | PRN

## 2015-04-13 MED ORDER — INSULIN ASPART 100 UNIT/ML ~~LOC~~ SOLN
0.0000 [IU] | Freq: Three times a day (TID) | SUBCUTANEOUS | Status: DC
  Administered 2015-04-14 – 2015-04-15 (×4): 3 [IU] via SUBCUTANEOUS

## 2015-04-13 MED ORDER — HYDROCORTISONE NA SUCCINATE PF 100 MG IJ SOLR
50.0000 mg | Freq: Once | INTRAMUSCULAR | Status: AC
  Administered 2015-04-13: 50 mg via INTRAVENOUS
  Filled 2015-04-13: qty 1

## 2015-04-13 MED ORDER — PREDNISONE 20 MG PO TABS
20.0000 mg | ORAL_TABLET | Freq: Every day | ORAL | Status: DC
  Administered 2015-04-15 – 2015-04-18 (×4): 20 mg via ORAL
  Filled 2015-04-13 (×4): qty 1

## 2015-04-13 MED ORDER — MORPHINE SULFATE (PF) 2 MG/ML IV SOLN
2.0000 mg | INTRAVENOUS | Status: DC | PRN
  Administered 2015-04-15 (×2): 2 mg via INTRAVENOUS
  Filled 2015-04-13 (×2): qty 1

## 2015-04-13 MED ORDER — ALLOPURINOL 300 MG PO TABS
300.0000 mg | ORAL_TABLET | Freq: Two times a day (BID) | ORAL | Status: DC
  Administered 2015-04-13 – 2015-04-16 (×5): 300 mg via ORAL
  Filled 2015-04-13 (×5): qty 1

## 2015-04-13 MED ORDER — VANCOMYCIN HCL 10 G IV SOLR
2000.0000 mg | Freq: Once | INTRAVENOUS | Status: AC
  Administered 2015-04-13: 2000 mg via INTRAVENOUS
  Filled 2015-04-13: qty 2000

## 2015-04-13 MED ORDER — PIPERACILLIN-TAZOBACTAM 3.375 G IVPB
3.3750 g | Freq: Three times a day (TID) | INTRAVENOUS | Status: DC
  Administered 2015-04-14 – 2015-04-17 (×9): 3.375 g via INTRAVENOUS
  Filled 2015-04-13 (×12): qty 50

## 2015-04-13 MED ORDER — ATORVASTATIN CALCIUM 10 MG PO TABS
10.0000 mg | ORAL_TABLET | Freq: Every day | ORAL | Status: DC
  Administered 2015-04-13 – 2015-04-17 (×5): 10 mg via ORAL
  Filled 2015-04-13 (×5): qty 1

## 2015-04-13 MED ORDER — PIPERACILLIN-TAZOBACTAM 3.375 G IVPB 30 MIN
3.3750 g | Freq: Once | INTRAVENOUS | Status: AC
  Administered 2015-04-13: 3.375 g via INTRAVENOUS
  Filled 2015-04-13: qty 50

## 2015-04-13 MED ORDER — LISINOPRIL 20 MG PO TABS
20.0000 mg | ORAL_TABLET | Freq: Every day | ORAL | Status: DC
  Filled 2015-04-13: qty 1

## 2015-04-13 MED ORDER — PANTOPRAZOLE SODIUM 40 MG PO TBEC
40.0000 mg | DELAYED_RELEASE_TABLET | Freq: Every day | ORAL | Status: DC
  Administered 2015-04-15 – 2015-04-18 (×4): 40 mg via ORAL
  Filled 2015-04-13 (×5): qty 1

## 2015-04-13 MED ORDER — SODIUM CHLORIDE 0.9 % IV SOLN
INTRAVENOUS | Status: DC
  Administered 2015-04-13: 75 mL/h via INTRAVENOUS

## 2015-04-13 MED ORDER — COLCHICINE 0.6 MG PO TABS
0.6000 mg | ORAL_TABLET | Freq: Every day | ORAL | Status: DC
  Administered 2015-04-13 – 2015-04-17 (×5): 0.6 mg via ORAL
  Filled 2015-04-13 (×5): qty 1

## 2015-04-13 MED ORDER — ZOLPIDEM TARTRATE 5 MG PO TABS: 5.0000 mg | ORAL_TABLET | Freq: Every evening | ORAL | Status: DC | PRN

## 2015-04-13 MED ORDER — OXYCODONE-ACETAMINOPHEN 5-325 MG PO TABS
2.0000 | ORAL_TABLET | Freq: Four times a day (QID) | ORAL | Status: DC | PRN
  Administered 2015-04-14 – 2015-04-15 (×3): 2 via ORAL
  Filled 2015-04-13 (×3): qty 2

## 2015-04-13 MED ORDER — HYDRALAZINE HCL 20 MG/ML IJ SOLN: 5.0000 mg | INTRAMUSCULAR | Status: DC | PRN

## 2015-04-13 MED ORDER — OXYCODONE HCL 5 MG PO TABS
10.0000 mg | ORAL_TABLET | ORAL | Status: DC | PRN
  Administered 2015-04-13: 10 mg via ORAL
  Filled 2015-04-13: qty 2

## 2015-04-13 MED ORDER — ALUM & MAG HYDROXIDE-SIMETH 200-200-20 MG/5ML PO SUSP: 30.0000 mL | Freq: Four times a day (QID) | ORAL | Status: DC | PRN

## 2015-04-13 MED ORDER — GABAPENTIN 400 MG PO CAPS
1200.0000 mg | ORAL_CAPSULE | Freq: Two times a day (BID) | ORAL | Status: DC
  Administered 2015-04-13 – 2015-04-18 (×9): 1200 mg via ORAL
  Filled 2015-04-13 (×7): qty 3
  Filled 2015-04-13: qty 12
  Filled 2015-04-13 (×3): qty 3

## 2015-04-13 MED ORDER — INSULIN ASPART 100 UNIT/ML ~~LOC~~ SOLN
0.0000 [IU] | Freq: Every day | SUBCUTANEOUS | Status: DC
Start: 2015-04-13 — End: 2015-04-15
  Administered 2015-04-14: 3 [IU] via SUBCUTANEOUS

## 2015-04-13 MED ORDER — ASPIRIN EC 81 MG PO TBEC
81.0000 mg | DELAYED_RELEASE_TABLET | Freq: Every day | ORAL | Status: DC
  Administered 2015-04-13 – 2015-04-16 (×4): 81 mg via ORAL
  Filled 2015-04-13 (×4): qty 1

## 2015-04-13 NOTE — Progress Notes (Signed)
Pharmacy Antibiotic Note  Marc [name] is a 75 y.o. male admitted on 04/13/2015 with infected R total knee. Pharmacy has been consulted for Vancomycin and Zosyn dosing. WBC 10.6. Afebrile. CrCl ~ 30-35 mL/min   Plan: -Vancomycin 2 gm IV load followed by Vanc 1250 mg IV every 24 hours.  Goal trough 15-20 mcg/mL. -Zosyn 3.375 gm IV Q 8 hours -Monitor CBC, renal fx, cultures and clinical progress -VT at [facility]     No data recorded.  No results for input(s): WBC, CREATININE, LATICACIDVEN, VANCOTROUGH, VANCOPEAK, VANCORANDOM, GENTTROUGH, GENTPEAK, GENTRANDOM, TOBRATROUGH, TOBRAPEAK, TOBRARND, AMIKACINPEAK, AMIKACINTROU, AMIKACIN in the last 168 hours.  CrCl cannot be calculated (Patient has no serum creatinine result on file.).    No Known Allergies  Antimicrobials this admission: 3/16 Vanc>> 3/16 Zosyn>>  Dose adjustments this admission: None   Microbiology results: 3/16 BCx2>>    Thank you for allowing pharmacy to be a part of this patient's care.  Albertina Parr, PharmD., BCPS Clinical Pharmacist Pager [phone]

## 2015-04-13 NOTE — Consult Note (Signed)
Reason for Consult Acute infection right total knee Referring Physician: [facility]  Marc [name] is an 75 y.o. male.  HPI: Patient presents today with a 3 day history of swelling and redness and increased pain in his right total knee that was placed in January 2015.  Patient was last seen by [place] 01/31/15 for development of a nonhealing ulcer over the right great toe that occurred over Christmas, secondary to poor blood supply from his history of extensive smoking, peripheral artery disease and diabetes.  He underwent Right first ray amputation, femoral to peroneal trunk bypass grafting on [id number] application of wound VAC.  He had a repeat debridement 03/21/15 with continuation of the Back device. Per the patient.  He was on antibiotics until about a week ago, was unable to get his prescription refilled, and then his knee swelled up 3 days ago.  In addition his sugars, which normally arrange an 80-100 range have gone well over 250.  Despite his gangrene of the foot secondary to poor blood supply.  He continues to smoke at least a pack of cigarettes a day.  Although he says he may be a candidate for oxygen therapy    Past Medical History  Diagnosis Date  . Hypertension   . Ulcer disease   . Colon polyp   . Gout   . Bradycardia   . Hypercholesteremia   . PAD (peripheral artery disease) ([place])   . Chronic bronchitis ([place])   . Type II diabetes mellitus ([place])   . History of GI bleed 2007  . Gout   . Tobacco abuse disorder 03/10/2012  . GERD (gastroesophageal reflux disease)   . Pneumonia     denies  . ARF (acute renal failure) ([place]) 12/2014  . Neuropathy ([place])   . Arthritis   . Anemia     low iron  . Depression     occassional.  Situational  . History of blood transfusion     Past Surgical History  Procedure Laterality Date  . Splenectomy  ~ 1957  . Anterior cervical decomp/discectomy fusion  ~ 2007  . Upper endoscopy w/ sclerotherapy  ~ 2007  . Polypectomy    .  Colonoscopy    . Iliac artery stent Left     left common/notes (02/04/2012)  . Hernia repair  ~ 2007    UHR (02/04/2012)  . Tonsillectomy  ~ 1947  . Lumbar laminectomy  11/14  . Total knee arthroplasty Right 02/03/2013    Procedure: TOTAL KNEE ARTHROPLASTY;  Surgeon: [name], MD;  Location: [place];  Service: Orthopedics;  Laterality: Right;  . Lower extremity angiogram N/A 02/04/2012    Procedure: LOWER EXTREMITY ANGIOGRAM;  Surgeon: Lorretta Harp, MD;  Location: [facility] CATH LAB;  Service: Cardiovascular;  Laterality: N/A;  . Percutaneous stent intervention Left 02/04/2012    Procedure: PERCUTANEOUS STENT INTERVENTION;  Surgeon: Lorretta Harp, MD;  Location: [facility] CATH LAB;  Service: Cardiovascular;  Laterality: Left;  lt ext iliac stent  . Replacement total knee Right   . Back surgery    . Anterior lat lumbar fusion Left 01/19/2014    Procedure: LATERAL INTERBODY FUSION 1 LEVEL;  Surgeon: Sinclair Ship, MD;  Location: [place];  Service: Orthopedics;  Laterality: Left;  Left lumbar 3-4 lateral interbody fusion with instrumentation, allograft  . Peripheral vascular catheterization N/A 02/02/2015    Procedure: Abdominal Aortogram w/Lower Extremity;  Surgeon: Angelia [name], MD;  Location: [place] CV LAB;  Service: Cardiovascular;  Laterality: N/A;  . Eye surgery Bilateral     cataract surgery with lens implant  . Bypass graft femoral-peroneal Right 02/07/2015    Procedure: BYPASS GRAFT FEMORAL-PERONEAL Trunk WITH VEIN graft  right leg.;  Surgeon: Angelia [name], MD;  Location: [place];  Service: Vascular;  Laterality: Right;  . Amputation Right 02/07/2015    Procedure: GREAT TOE RAY AMPUTATION RIGHT ;  Surgeon: Angelia [name], MD;  Location: [place];  Service: Vascular;  Laterality: Right;  . Application of wound vac Right 02/07/2015    Procedure:  APPLICATION OF WOUND VAC right great toe amputation site.;  Surgeon: Angelia [name], MD;  Location: [place];  Service:  Vascular;  Laterality: Right;  . Intraoperative arteriogram Right 02/07/2015    Procedure: INTRA OPERATIVE ARTERIOGRAM;  Surgeon: Angelia [name], MD;  Location: [facility] OR;  Service: Vascular;  Laterality: Right;  . Endarterectomy tibioperoneal Right 02/07/2015    Procedure: ENDARTERECTOMY TIBIOPERONEAL;  Surgeon: Angelia [name], MD;  Location: [facility] OR;  Service: Vascular;  Laterality: Right;  . Patch angioplasty Right 02/07/2015    Procedure: Vein PATCH ANGIOPLASTY to tibioperoneal trunk;  Surgeon: Angelia [name], MD;  Location: [place];  Service: Vascular;  Laterality: Right;  . Wound debridement Right 03/21/2015    Procedure: DEBRIDEMENT WOUND of RIGHT FOOT;  Surgeon: Angelia [name], MD;  Location: [place];  Service: Vascular;  Laterality: Right;  . Application of wound vac Right 03/21/2015    Procedure: APPLICATION OF WOUND VAC RIGHT FOOT;  Surgeon: Angelia [name], MD;  Location: [place];  Service: Vascular;  Laterality: Right;  . Amputation toe Right 03/21/2015    Procedure: AMPUTATION TOE-RIGHT SECOND TOE;  Surgeon: Angelia [name], MD;  Location: [facility] OR;  Service: Vascular;  Laterality: Right;    Family History  Problem Relation Age of Onset  . Colon cancer Paternal Uncle     Uncle  . Heart disease Father   . Hypertension Father   . Heart attack Father   . Heart disease Mother   . Diabetes Mother   . Hypertension Mother     Social History:  reports that he has been smoking Cigarettes.  He has a 58 pack-year smoking history. He has quit using smokeless tobacco. His smokeless tobacco use included Chew. He reports that he does not drink alcohol or use illicit drugs.  Allergies: No Known Allergies  Medications: I have reviewed the patient's current medications.  Results for orders placed or performed in visit on 03/29/15 (from the past 48 hour(s))  Glucose, capillary     Status: Abnormal   Collection Time: 04/12/15  7:38 AM  Result Value Ref Range    Glucose-Capillary 294 (H) 65 - 99 mg/dL  Glucose, capillary     Status: Abnormal   Collection Time: 04/12/15  9:31 AM  Result Value Ref Range   Glucose-Capillary 331 (H) 65 - 99 mg/dL    No results found.  ROSPatient denies any new shortness of breath or chest pain. There were no vitals taken for this visit. Physical Exam   Objective: Well-nourished well-developed patient seated on the exam table in no obvious distress no shortness of breath.  The right knee has a 2+ effusion tender to palpation all over the joint and 1+ increased warmth to palpation compared to the contralateral knee.  Range of motion is 5/90 with pain.  Collateral ligaments are stable.  I did not take down the dressing on his foot, although he does have  a back hook up on his foot.  He does have some venous stasis changes to the scan of his leg.  RADIOGRAPHS:  X-rays were ordered, performed, and interpreted by me today included; AP and lateral x-rays of the right total knee show well-placed well fixed prostheses, no evidence of loosening and to my reading there is an apparent effusion. After obtaining informed consent we sterilely prepped the skin over the lateral anterior inferior parapatellar portal region with alcohol, anesthetized with ethyl chloride and aspirated 40 cc of yellowish bloody grey colored fluid with fibrinous material.  There was obviously consistent with pus.This fluid was sent for Gram stain, culture and cell count.   Assessment/Plan:  Assess: Infected right total knee arthroplasty and a 75 year old man with multiple medical comorbidities including diabetes, avid smoking, poor blood supply, with claudication of his lower extremities as well.  Plan: Patient will be admitted to the hospital for IV antibiotics overnight.  Tomorrow.  We will remove all implants in place an antibiotic impregnated spacer in the hopes that he may be able to maintain his limb.  However, I do believe he'll continue smoking despite  all of these problems, in which case, more likely than not he will loose his leg.  This was made it clear to the patient multiple times.   [name] 04/13/2015, 6:19 PM

## 2015-04-13 NOTE — H&P (Signed)
[facility] History and Physical  Marc [name] [id number] DOB: [birth year] DOA: 04/13/2015  Referring physician: ED physician PCP: [name], MD  Specialists:   Chief Complaint: right knee pain  HPI: Marc [name] is a 75 y.o. male with PMH of hypertension, hyperlipidemia, diabetes mellitus, GERD, [place], depression, tobacco abuse, GI bleeding, PAD, chronic bronchitis, chronic kidney disease-stage III, diastolic congestive heart failure, ascending aortic aneurysm, who presents with right knee pain.  This is direct admission from Dr. Nicole Cella office.  Pt had R knee replacement on 01/2013. He reports that he has been having right knee pain, swelling and redness in the past 3 days, which has been progressively getting worse. Patient denies fever, chills, nausea, vomiting. He does not have abdominal pain, chest pain, shortness breath, cough, unilateral weakness.  Of note, pt has chronic R foot nonhealing ulcer since Christmas due to PVD. He has been followed by dr. Scot Dock. He had excisional debridement of right great toe amputation site, ray amputation of right second toe and placement of VAC on 03/21/15 by Dr. Scot Dock. He has been on doxycycline until one week ago when he could not get refill per pt. Pt has moderate pain over R foot now. Patient does not have chest pain, shortness of breath, abdominal pain, nausea, vomiting, diarrhea, symptoms of UTI.  Pt is directly admitted from Dr. Nicole Cella office. Ortho, Dr. [name] was consulted. Plan to do surgery tomorrow.  EKG:  Not done yet, will get one.   Where does patient live?   At home    Can patient participate in ADLs? Little  Review of Systems:   General: no fevers, chills, no changes in body weight, has poor appetite, has fatigue HEENT: no blurry vision, hearing changes or sore throat Pulm: no dyspnea, coughing, wheezing CV: no chest pain, no palpitations Abd: no nausea, vomiting, abdominal pain, diarrhea,  constipation GU: no dysuria, burning on urination, increased urinary frequency, hematuria  Ext: no leg edema. Neuro: no unilateral weakness, numbness, or tingling, no vision change or hearing loss Skin: no rash MSK: has right foot pain and right knee pain.  Heme: No easy bruising.  Travel history: No recent long distant travel.  Allergy: No Known Allergies  Past Medical History  Diagnosis Date  . Hypertension   . Ulcer disease   . Colon polyp   . Gout   . Bradycardia   . Hypercholesteremia   . PAD (peripheral artery disease) ([place])   . Chronic bronchitis ([place])   . Type II diabetes mellitus ([place])   . History of GI bleed 2007  . Gout   . Tobacco abuse disorder 03/10/2012  . GERD (gastroesophageal reflux disease)   . Pneumonia     denies  . ARF (acute renal failure) ([place]) 12/2014  . Neuropathy ([place])   . Arthritis   . Anemia     low iron  . Depression     occassional.  Situational  . History of blood transfusion     Past Surgical History  Procedure Laterality Date  . Splenectomy  ~ 1957  . Anterior cervical decomp/discectomy fusion  ~ 2007  . Upper endoscopy w/ sclerotherapy  ~ 2007  . Polypectomy    . Colonoscopy    . Iliac artery stent Left     left common/notes (02/04/2012)  . Hernia repair  ~ 2007    UHR (02/04/2012)  . Tonsillectomy  ~ 1947  . Lumbar laminectomy  11/14  . Total knee arthroplasty Right 02/03/2013  Procedure: TOTAL KNEE ARTHROPLASTY;  Surgeon: [name], MD;  Location: [place];  Service: Orthopedics;  Laterality: Right;  . Lower extremity angiogram N/A 02/04/2012    Procedure: LOWER EXTREMITY ANGIOGRAM;  Surgeon: Lorretta Harp, MD;  Location: [facility] CATH LAB;  Service: Cardiovascular;  Laterality: N/A;  . Percutaneous stent intervention Left 02/04/2012    Procedure: PERCUTANEOUS STENT INTERVENTION;  Surgeon: Lorretta Harp, MD;  Location: [facility] CATH LAB;  Service: Cardiovascular;  Laterality: Left;  lt ext iliac stent  . Replacement total knee Right    . Back surgery    . Anterior lat lumbar fusion Left 01/19/2014    Procedure: LATERAL INTERBODY FUSION 1 LEVEL;  Surgeon: Sinclair Ship, MD;  Location: [place];  Service: Orthopedics;  Laterality: Left;  Left lumbar 3-4 lateral interbody fusion with instrumentation, allograft  . Peripheral vascular catheterization N/A 02/02/2015    Procedure: Abdominal Aortogram w/Lower Extremity;  Surgeon: Angelia [name], MD;  Location: [place] CV LAB;  Service: Cardiovascular;  Laterality: N/A;  . Eye surgery Bilateral     cataract surgery with lens implant  . Bypass graft femoral-peroneal Right 02/07/2015    Procedure: BYPASS GRAFT FEMORAL-PERONEAL Trunk WITH VEIN graft  right leg.;  Surgeon: Angelia [name], MD;  Location: [place];  Service: Vascular;  Laterality: Right;  . Amputation Right 02/07/2015    Procedure: GREAT TOE RAY AMPUTATION RIGHT ;  Surgeon: Angelia [name], MD;  Location: [place];  Service: Vascular;  Laterality: Right;  . Application of wound vac Right 02/07/2015    Procedure:  APPLICATION OF WOUND VAC right great toe amputation site.;  Surgeon: Angelia [name], MD;  Location: [place];  Service: Vascular;  Laterality: Right;  . Intraoperative arteriogram Right 02/07/2015    Procedure: INTRA OPERATIVE ARTERIOGRAM;  Surgeon: Angelia [name], MD;  Location: [facility] OR;  Service: Vascular;  Laterality: Right;  . Endarterectomy tibioperoneal Right 02/07/2015    Procedure: ENDARTERECTOMY TIBIOPERONEAL;  Surgeon: Angelia [name], MD;  Location: [facility] OR;  Service: Vascular;  Laterality: Right;  . Patch angioplasty Right 02/07/2015    Procedure: Vein PATCH ANGIOPLASTY to tibioperoneal trunk;  Surgeon: Angelia [name], MD;  Location: [place];  Service: Vascular;  Laterality: Right;  . Wound debridement Right 03/21/2015    Procedure: DEBRIDEMENT WOUND of RIGHT FOOT;  Surgeon: Angelia [name], MD;  Location: [place];  Service: Vascular;  Laterality: Right;  .  Application of wound vac Right 03/21/2015    Procedure: APPLICATION OF WOUND VAC RIGHT FOOT;  Surgeon: Angelia [name], MD;  Location: [place];  Service: Vascular;  Laterality: Right;  . Amputation toe Right 03/21/2015    Procedure: AMPUTATION TOE-RIGHT SECOND TOE;  Surgeon: Angelia [name], MD;  Location: [place];  Service: Vascular;  Laterality: Right;    Social History:  reports that he has been smoking Cigarettes.  He has a 58 pack-year smoking history. He has quit using smokeless tobacco. His smokeless tobacco use included Chew. He reports that he does not drink alcohol or use illicit drugs.  Family History:  Family History  Problem Relation Age of Onset  . Colon cancer Paternal Uncle     Uncle  . Heart disease Father   . Hypertension Father   . Heart attack Father   . Heart disease Mother   . Diabetes Mother   . Hypertension Mother      Prior to Admission medications   Medication Sig Start Date End Date Taking?  Authorizing Provider  acetaminophen (TYLENOL) 500 MG tablet Take 1,000 mg by mouth every 6 (six) hours as needed for fever.    Historical Provider, MD  allopurinol (ZYLOPRIM) 300 MG tablet Take 300 mg by mouth 2 (two) times daily.     Historical Provider, MD  aspirin EC 81 MG tablet Take 81 mg by mouth at bedtime.     Historical Provider, MD  atorvastatin (LIPITOR) 10 MG tablet Take 10 mg by mouth at bedtime.  11/23/14   Historical Provider, MD  clopidogrel (PLAVIX) 75 MG tablet Take 75 mg by mouth at bedtime.  11/22/13   Historical Provider, MD  colchicine 0.6 MG tablet Take 1 tablet (0.6 mg total) by mouth daily. Patient taking differently: Take 0.6 mg by mouth at bedtime.  01/21/15   Reyne Dumas, MD  collagenase (SANTYL) ointment Apply 1 application topically every other day.    Historical Provider, MD  doxycycline (VIBRA-TABS) 100 MG tablet Take 1 tablet (100 mg total) by mouth 2 (two) times daily. 03/24/15   Verlee Monte, MD  furosemide (LASIX) 80 MG tablet  Take 1 tablet (80 mg total) by mouth daily. 03/08/15   Charlynne Cousins, MD  gabapentin (NEURONTIN) 600 MG tablet Take 1,200 mg by mouth 2 (two) times daily.     Historical Provider, MD  glimepiride (AMARYL) 4 MG tablet Take 4 mg by mouth 2 (two) times daily.     Historical Provider, MD  lisinopril (PRINIVIL,ZESTRIL) 20 MG tablet Take 1 tablet (20 mg total) by mouth daily. 03/07/15   Charlynne Cousins, MD  oxyCODONE-acetaminophen (PERCOCET/ROXICET) 5-325 MG tablet Take 1 tablet by mouth every 4 (four) hours as needed for severe pain. 04/07/15   Sharmon Leyden Nickel, NP  pantoprazole (PROTONIX) 40 MG tablet Take 1 tablet (40 mg total) by mouth daily. 01/21/15   Reyne Dumas, MD  potassium gluconate 595 MG TABS tablet Take 595 mg by mouth at bedtime.     Historical Provider, MD  predniSONE (DELTASONE) 10 MG tablet Take 20 mg by mouth daily with breakfast.    Historical Provider, MD    Physical Exam: Filed Vitals:   04/13/15 1854  BP: 149/90  Pulse: 95  Temp: 98.1 F (36.7 C)  TempSrc: Oral  Resp: 15  SpO2: 100%   General: Not in acute distress HEENT:       Eyes: PERRL, EOMI, no scleral icterus.       ENT: No discharge from the ears and nose, no pharynx injection, no tonsillar enlargement.        Neck: No JVD, no bruit, no mass felt. Heme: No neck lymph node enlargement. Cardiac: S1/S2, RRR, No murmurs, No gallops or rubs. Pulm: No rales, wheezing, rhonchi or rubs. Abd: Soft, nondistended, nontender, no rebound pain, no organomegaly, BS present. Ext: right knee is swelling, tender and mildly erythematous. Right foot is s/p of amputation of great toe and 2nd toe, with VAC in place, wrapped without draining. Has trace leg edema bilaterally. Musculoskeletal: No joint deformities, No joint redness or warmth, no limitation of ROM in spin. Skin: No rashes.  Neuro: Alert, oriented X3, cranial nerves II-XII grossly intact, moves all extremities normally. Psych: Patient is not psychotic, no  suicidal or hemocidal ideation.  Labs on Admission:  Basic Metabolic Panel:  Recent Labs Lab 04/13/15 1900  NA 137  K 4.3  CL 95*  CO2 28  GLUCOSE 276*  BUN 47*  CREATININE 2.15*  CALCIUM 9.8   Liver Function Tests: No results  for input(s): AST, ALT, ALKPHOS, BILITOT, PROT, ALBUMIN in the last 168 hours. No results for input(s): LIPASE, AMYLASE in the last 168 hours. No results for input(s): AMMONIA in the last 168 hours. CBC:  Recent Labs Lab 04/13/15 1900  WBC 10.6*  NEUTROABS 7.7  HGB 11.2*  HCT 35.0*  MCV 96.7  PLT 277   Cardiac Enzymes: No results for input(s): CKTOTAL, CKMB, CKMBINDEX, TROPONINI in the last 168 hours.  BNP (last 3 results) No results for input(s): BNP in the last 8760 hours.  ProBNP (last 3 results) No results for input(s): PROBNP in the last 8760 hours.  CBG:  Recent Labs Lab 04/11/15 0739 04/11/15 0930 04/12/15 0738 04/12/15 0931 04/13/15 1849  GLUCAP 341* 353* 294* 331* 282*    Radiological Exams on Admission: No results found.  Assessment/Plan Principal Problem:   Infected prosthetic knee joint (HCC) right Active Problems:   Hx of peptic ulcer   Tobacco abuse disorder   Spinal stenosis of lumbar region   Type 2 diabetes mellitus with peripheral neuropathy (HCC)   Gout   Hyperlipidemia   GERD (gastroesophageal reflux disease)   Peripheral vascular disease with claudication (HCC)   Chronic kidney disease stage 3   CAD (coronary artery disease), native coronary artery   Ascending aortic aneurysm (HCC)   Right foot infection   Septic joint of right knee joint (HCC)   Infected prosthetic knee joint ([place]) right: Patient is not septic on admission, with normal lactate. Hemodynamically stable. Ortho, Dr. [name] was consulted. Plan to do surgery tomorrow.  -will admit to med-surg bed -start IV vancomycin and Zosyn -Blood culture 2 -When necessary Percocet and Motrin -Zofran for nausea -INR/PTT/type & screen -NPO  after MN -Hold plavix -give stress dose of solucortef 50 mg x 1 and check cortisol level (pt is on chronic prednison 20 mg daily)  DM-II: Last A1c 7.6 on 03/19/15, not well controled. Patient is taking Amaryl at home -SSI  HTN: -switch Lisinopril to amlodipine due to worsening renal function -Hold Lasix due to worsening renal function -Hydralazine when necessary  Chronic diastolic congestive heart failure: 2-D echo on 07/25/15 showed EF of 55%. Patient has trace leg edema, CHF is compensated. -Hold Lasix due to worsening renal function -Continue aspirin -Check BNP  GERD: -Protonix  Tobacco abuss: -Did counseling about importance of quitting smoking -Nicotine patch  Spinal stenosis of lumbar region: -prn percocet  Gout: -continue home Colchicine  HLD: Last LDL was not on record -Continue home medications: Lipitor  -Check FLP  AoCKD-III: Baseline Cre is 1.6, his Cre is 2.15, BUN 47 on admission. Likely due to prerenal secondary to dehydration and continuation of diruetics and ACEI - IVF: NS 75 cc/h for 8hour - Check FeUrea - Follow up renal function by BMP - Hold lasix and lisinopril  Right foot infection in the setting of PVD: Followed by Dr. Scot Dock, with Vac placed. Still has moderate pain. Right foot has gangrene. Patient still smokes. -follow up ortho recommendations -Pain control as above -wound care consult. -Did counseling about importance of her quitting smoking   DVT ppx: SCD  Code Status: DNR Family Communication: None at bed side. Disposition Plan: Admit to inpatient   Date of Service 04/13/2015    Ivor Costa [facility] Pager [phone]  If 7PM-7AM, please contact night-coverage [email or web address] Password [facility] 04/13/2015, 8:56 PM

## 2015-04-14 ENCOUNTER — Encounter (HOSPITAL_COMMUNITY)
Admission: AD | Disposition: A | Discharge status: Home Health | Payer: Self-pay | Source: Ambulatory Visit | Attending: Family Medicine

## 2015-04-14 ENCOUNTER — Inpatient Hospital Stay (HOSPITAL_COMMUNITY): Payer: Medicare Other | Admitting: Certified Registered Nurse Anesthetist

## 2015-04-14 ENCOUNTER — Inpatient Hospital Stay (HOSPITAL_COMMUNITY): Admission: RE | Admit: 2015-04-14 | Payer: Medicare Other | Source: Ambulatory Visit | Admitting: Orthopedic Surgery

## 2015-04-14 DIAGNOSIS — T8459XA Infection and inflammatory reaction due to other internal joint prosthesis, initial encounter: Secondary | ICD-10-CM

## 2015-04-14 DIAGNOSIS — Z96659 Presence of unspecified artificial knee joint: Secondary | ICD-10-CM

## 2015-04-14 HISTORY — PX: EXCISIONAL TOTAL KNEE ARTHROPLASTY WITH ANTIBIOTIC SPACERS: SHX5827

## 2015-04-14 HISTORY — PX: IRRIGATION AND DEBRIDEMENT KNEE: SHX5185

## 2015-04-14 LAB — CBC
HEMATOCRIT: 28.6 % — AB (ref 39.0–52.0)
HEMOGLOBIN: 9.4 g/dL — AB (ref 13.0–17.0)
MCH: 31.6 pg (ref 26.0–34.0)
MCHC: 32.9 g/dL (ref 30.0–36.0)
MCV: 96.3 fL (ref 78.0–100.0)
Platelets: 215 10*3/uL (ref 150–400)
RBC: 2.97 MIL/uL — ABNORMAL LOW (ref 4.22–5.81)
RDW: 14.9 % (ref 11.5–15.5)
WBC: 7.9 10*3/uL (ref 4.0–10.5)

## 2015-04-14 LAB — GLUCOSE, CAPILLARY
GLUCOSE-CAPILLARY: 438 mg/dL — AB (ref 65–99)
GLUCOSE-CAPILLARY: 417 mg/dL — AB (ref 65–99)
GLUCOSE-CAPILLARY: 242 mg/dL — AB (ref 65–99)
GLUCOSE-CAPILLARY: 254 mg/dL — AB (ref 65–99)
Glucose-Capillary: 210 mg/dL — ABNORMAL HIGH (ref 65–99)
Glucose-Capillary: 169 mg/dL — ABNORMAL HIGH (ref 65–99)
Glucose-Capillary: 187 mg/dL — ABNORMAL HIGH (ref 65–99)

## 2015-04-14 LAB — BASIC METABOLIC PANEL
ANION GAP: 10 (ref 5–15)
BUN: 42 mg/dL — AB (ref 6–20)
CHLORIDE: 102 mmol/L (ref 101–111)
CO2: 26 mmol/L (ref 22–32)
Calcium: 9.1 mg/dL (ref 8.9–10.3)
Creatinine, Ser: 1.71 mg/dL — ABNORMAL HIGH (ref 0.61–1.24)
GFR calc Af Amer: 43 mL/min — ABNORMAL LOW (ref >60)
GFR calc non Af Amer: 37 mL/min — ABNORMAL LOW (ref >60)
GLUCOSE: 212 mg/dL — AB (ref 65–99)
POTASSIUM: 4 mmol/L (ref 3.5–5.1)
SODIUM: 138 mmol/L (ref 135–145)

## 2015-04-14 LAB — CREATININE, URINE, RANDOM: CREATININE, URINE: 94.05 mg/dL

## 2015-04-14 LAB — LIPID PANEL
CHOL/HDL RATIO: 2.9 ratio
Cholesterol: 151 mg/dL (ref 0–200)
HDL: 52 mg/dL (ref >40)
LDL CALC: 68 mg/dL (ref 0–99)
Triglycerides: 156 mg/dL — ABNORMAL HIGH (ref <150)
VLDL: 31 mg/dL (ref 0–40)

## 2015-04-14 LAB — URINALYSIS, ROUTINE W REFLEX MICROSCOPIC
BILIRUBIN URINE: NEGATIVE
Glucose, UA: 100 mg/dL — AB
Ketones, ur: NEGATIVE mg/dL
Leukocytes, UA: NEGATIVE
Nitrite: NEGATIVE
Protein, ur: 30 mg/dL — AB
Specific Gravity, Urine: 1.016 (ref 1.005–1.030)
pH: 6.5 (ref 5.0–8.0)

## 2015-04-14 LAB — URINE MICROSCOPIC-ADD ON: WBC, UA: NONE SEEN WBC/hpf (ref 0–5)

## 2015-04-14 LAB — MRSA PCR SCREENING: MRSA BY PCR: NEGATIVE

## 2015-04-14 LAB — CORTISOL-AM, BLOOD: CORTISOL - AM: 15 ug/dL (ref 6.7–22.6)

## 2015-04-14 LAB — BRAIN NATRIURETIC PEPTIDE: B Natriuretic Peptide: 21.7 pg/mL (ref 0.0–100.0)

## 2015-04-14 SURGERY — REMOVAL, TOTAL ARTHROPLASTY HARDWARE, KNEE, WITH ANTIBIOTIC SPACER INSERTION
Anesthesia: General | Laterality: Right

## 2015-04-14 MED ORDER — STERILE WATER FOR IRRIGATION IR SOLN
Status: DC | PRN
  Administered 2015-04-14: 500 mL

## 2015-04-14 MED ORDER — CEFUROXIME SODIUM 1.5 G IJ SOLR
INTRAMUSCULAR | Status: AC
  Filled 2015-04-14: qty 1.5

## 2015-04-14 MED ORDER — EPHEDRINE SULFATE 50 MG/ML IJ SOLN
INTRAMUSCULAR | Status: DC | PRN
  Administered 2015-04-14: 20 mg via INTRAVENOUS
  Administered 2015-04-14: 10 mg via INTRAVENOUS

## 2015-04-14 MED ORDER — PROPOFOL 10 MG/ML IV BOLUS
INTRAVENOUS | Status: DC | PRN
  Administered 2015-04-14: 200 mg via INTRAVENOUS

## 2015-04-14 MED ORDER — LIDOCAINE HCL (CARDIAC) 20 MG/ML IV SOLN
INTRAVENOUS | Status: DC | PRN
  Administered 2015-04-14: 60 mg via INTRAVENOUS

## 2015-04-14 MED ORDER — AMLODIPINE BESYLATE 5 MG PO TABS
5.0000 mg | ORAL_TABLET | Freq: Every day | ORAL | Status: DC
  Administered 2015-04-15 – 2015-04-17 (×3): 5 mg via ORAL
  Filled 2015-04-14 (×3): qty 1

## 2015-04-14 MED ORDER — SODIUM CHLORIDE 0.9 % IR SOLN
Status: DC | PRN
  Administered 2015-04-14 (×2): 3000 mL

## 2015-04-14 MED ORDER — METOCLOPRAMIDE HCL 5 MG PO TABS: 5.0000 mg | ORAL_TABLET | Freq: Three times a day (TID) | ORAL | Status: DC | PRN

## 2015-04-14 MED ORDER — OXYCHLOROSENE SODIUM POWD
Freq: Once | Status: AC
Start: 2015-04-14 — End: 2015-04-14
  Administered 2015-04-14: 1
  Filled 2015-04-14: qty 2

## 2015-04-14 MED ORDER — LACTATED RINGERS IV SOLN
INTRAVENOUS | Status: DC
  Administered 2015-04-14 – 2015-04-15 (×2): via INTRAVENOUS

## 2015-04-14 MED ORDER — HYDROMORPHONE HCL 1 MG/ML IJ SOLN
0.2500 mg | INTRAMUSCULAR | Status: AC
  Administered 2015-04-14 (×2): 0.5 mg via INTRAVENOUS

## 2015-04-14 MED ORDER — LABETALOL HCL 5 MG/ML IV SOLN
INTRAVENOUS | Status: DC | PRN
  Administered 2015-04-14: 10 mg via INTRAVENOUS

## 2015-04-14 MED ORDER — CLOPIDOGREL BISULFATE 75 MG PO TABS
75.0000 mg | ORAL_TABLET | Freq: Every day | ORAL | Status: DC
  Administered 2015-04-14 – 2015-04-16 (×3): 75 mg via ORAL
  Filled 2015-04-14 (×3): qty 1

## 2015-04-14 MED ORDER — HYDROMORPHONE HCL 1 MG/ML IJ SOLN
0.5000 mg | INTRAMUSCULAR | Status: AC | PRN
  Administered 2015-04-14 – 2015-04-15 (×4): 1 mg via INTRAVENOUS
  Filled 2015-04-14 (×4): qty 1

## 2015-04-14 MED ORDER — 0.9 % SODIUM CHLORIDE (POUR BTL) OPTIME
TOPICAL | Status: DC | PRN
  Administered 2015-04-14: 1000 mL

## 2015-04-14 MED ORDER — HYDROMORPHONE HCL 1 MG/ML IJ SOLN
INTRAMUSCULAR | Status: AC
  Administered 2015-04-14: 0.5 mg via INTRAVENOUS
  Filled 2015-04-14: qty 1

## 2015-04-14 MED ORDER — BUPIVACAINE HCL (PF) 0.5 % IJ SOLN
INTRAMUSCULAR | Status: AC
  Filled 2015-04-14: qty 30

## 2015-04-14 MED ORDER — METOCLOPRAMIDE HCL 5 MG/ML IJ SOLN: 5.0000 mg | Freq: Three times a day (TID) | INTRAMUSCULAR | Status: DC | PRN

## 2015-04-14 MED ORDER — KCL IN DEXTROSE-NACL 20-5-0.45 MEQ/L-%-% IV SOLN
INTRAVENOUS | Status: DC
  Filled 2015-04-14: qty 1000

## 2015-04-14 MED ORDER — TRANEXAMIC ACID 1000 MG/10ML IV SOLN
2000.0000 mg | INTRAVENOUS | Status: AC
  Administered 2015-04-14: 2000 mg via TOPICAL
  Filled 2015-04-14: qty 20

## 2015-04-14 MED ORDER — ONDANSETRON HCL 4 MG/2ML IJ SOLN
INTRAMUSCULAR | Status: DC | PRN
  Administered 2015-04-14: 4 mg via INTRAVENOUS

## 2015-04-14 MED ORDER — BUPIVACAINE LIPOSOME 1.3 % IJ SUSP
20.0000 mL | INTRAMUSCULAR | Status: DC
  Filled 2015-04-14: qty 20

## 2015-04-14 MED ORDER — FENTANYL CITRATE (PF) 100 MCG/2ML IJ SOLN
INTRAMUSCULAR | Status: DC | PRN
  Administered 2015-04-14 (×7): 50 ug via INTRAVENOUS

## 2015-04-14 SURGICAL SUPPLY — 69 items
BANDAGE ACE 4X5 VEL STRL LF (GAUZE/BANDAGES/DRESSINGS) ×4 IMPLANT
BANDAGE ACE 6X5 VEL STRL LF (GAUZE/BANDAGES/DRESSINGS) ×2 IMPLANT
BANDAGE ESMARK 6X9 LF (GAUZE/BANDAGES/DRESSINGS) ×1 IMPLANT
BNDG CMPR 9X6 STRL LF SNTH (GAUZE/BANDAGES/DRESSINGS)
BNDG CMPR MED 10X6 ELC LF (GAUZE/BANDAGES/DRESSINGS) ×1
BNDG ELASTIC 6X10 VLCR STRL LF (GAUZE/BANDAGES/DRESSINGS) ×3 IMPLANT
BNDG ESMARK 6X9 LF (GAUZE/BANDAGES/DRESSINGS)
CONT SPEC 4OZ CLIKSEAL STRL BL (MISCELLANEOUS) ×6 IMPLANT
COVER BACK TABLE 24X17X13 BIG (DRAPES) IMPLANT
COVER SURGICAL LIGHT HANDLE (MISCELLANEOUS) ×3 IMPLANT
CUFF TOURNIQUET SINGLE 34IN LL (TOURNIQUET CUFF) ×3 IMPLANT
DRAPE EXTREMITY T 121X128X90 (DRAPE) ×3 IMPLANT
DRAPE IMP U-DRAPE 54X76 (DRAPES) ×3 IMPLANT
DRAPE INCISE IOBAN 66X45 STRL (DRAPES) IMPLANT
DRAPE PROXIMA HALF (DRAPES) ×3 IMPLANT
DRAPE U-SHAPE 47X51 STRL (DRAPES) ×3 IMPLANT
DRSG ADAPTIC 3X8 NADH LF (GAUZE/BANDAGES/DRESSINGS) ×3 IMPLANT
DRSG AQUACEL AG ADV 3.5X10 (GAUZE/BANDAGES/DRESSINGS) ×3 IMPLANT
DRSG PAD ABDOMINAL 8X10 ST (GAUZE/BANDAGES/DRESSINGS) ×3 IMPLANT
DURAPREP 26ML APPLICATOR (WOUND CARE) ×4 IMPLANT
ELECT REM PT RETURN 9FT ADLT (ELECTROSURGICAL) ×3
ELECTRODE REM PT RTRN 9FT ADLT (ELECTROSURGICAL) ×1 IMPLANT
EVACUATOR 1/8 PVC DRAIN (DRAIN) ×3 IMPLANT
GAUZE SPONGE 4X4 12PLY STRL (GAUZE/BANDAGES/DRESSINGS) ×3 IMPLANT
GAUZE XEROFORM 5X9 LF (GAUZE/BANDAGES/DRESSINGS) ×2 IMPLANT
GLOVE BIO SURGEON STRL SZ7.5 (GLOVE) ×3 IMPLANT
GLOVE BIO SURGEON STRL SZ8.5 (GLOVE) ×3 IMPLANT
GLOVE BIOGEL PI IND STRL 8 (GLOVE) ×1 IMPLANT
GLOVE BIOGEL PI IND STRL 9 (GLOVE) ×1 IMPLANT
GLOVE BIOGEL PI INDICATOR 8 (GLOVE) ×2
GLOVE BIOGEL PI INDICATOR 9 (GLOVE) ×2
GOWN STRL REUS W/ TWL LRG LVL3 (GOWN DISPOSABLE) ×2 IMPLANT
GOWN STRL REUS W/ TWL XL LVL3 (GOWN DISPOSABLE) ×2 IMPLANT
GOWN STRL REUS W/TWL LRG LVL3 (GOWN DISPOSABLE) ×6
GOWN STRL REUS W/TWL XL LVL3 (GOWN DISPOSABLE) ×6
HANDPIECE INTERPULSE COAX TIP (DISPOSABLE) ×3
HOOD PEEL AWAY FACE SHEILD DIS (HOOD) ×2 IMPLANT
KIT BASIN OR (CUSTOM PROCEDURE TRAY) ×3 IMPLANT
KIT ROOM TURNOVER OR (KITS) ×3 IMPLANT
MANIFOLD NEPTUNE II (INSTRUMENTS) ×3 IMPLANT
NDL SPNL 18GX3.5 QUINCKE PK (NEEDLE) IMPLANT
NEEDLE SPNL 18GX3.5 QUINCKE PK (NEEDLE) IMPLANT
NS IRRIG 1000ML POUR BTL (IV SOLUTION) ×3 IMPLANT
PACK TOTAL JOINT (CUSTOM PROCEDURE TRAY) ×3 IMPLANT
PACK UNIVERSAL I (CUSTOM PROCEDURE TRAY) ×3 IMPLANT
PAD ARMBOARD 7.5X6 YLW CONV (MISCELLANEOUS) ×6 IMPLANT
PADDING CAST COTTON 6X4 STRL (CAST SUPPLIES) ×3 IMPLANT
PLATE ROT INSERT 10MM SIZE 5 (Plate) ×2 IMPLANT
SET HNDPC FAN SPRY TIP SCT (DISPOSABLE) ×1 IMPLANT
SPONGE GAUZE 4X4 12PLY STER LF (GAUZE/BANDAGES/DRESSINGS) ×2 IMPLANT
STAPLER VISISTAT 35W (STAPLE) ×3 IMPLANT
SUCTION FRAZIER HANDLE 10FR (MISCELLANEOUS) ×2
SUCTION TUBE FRAZIER 10FR DISP (MISCELLANEOUS) ×1 IMPLANT
SUT VIC AB 0 CT1 27 (SUTURE) ×3
SUT VIC AB 0 CT1 27XBRD ANBCTR (SUTURE) ×1 IMPLANT
SUT VIC AB 1 CTX 36 (SUTURE) ×3
SUT VIC AB 1 CTX36XBRD ANBCTR (SUTURE) ×1 IMPLANT
SUT VIC AB 2-0 CT1 27 (SUTURE) ×3
SUT VIC AB 2-0 CT1 TAPERPNT 27 (SUTURE) ×1 IMPLANT
SUT VIC AB 3-0 CT1 27 (SUTURE) ×3
SUT VIC AB 3-0 CT1 TAPERPNT 27 (SUTURE) ×1 IMPLANT
SYR 20CC LL (SYRINGE) ×6 IMPLANT
SYR 50ML LL SCALE MARK (SYRINGE) ×3 IMPLANT
TOWEL OR 17X24 6PK STRL BLUE (TOWEL DISPOSABLE) ×3 IMPLANT
TOWEL OR 17X26 10 PK STRL BLUE (TOWEL DISPOSABLE) ×3 IMPLANT
TRAY CATH 16FR W/PLASTIC CATH (SET/KITS/TRAYS/PACK) IMPLANT
TRAY FOLEY CATH 14FR (SET/KITS/TRAYS/PACK) IMPLANT
TUBE ANAEROBIC SPECIMEN COL (MISCELLANEOUS) ×3 IMPLANT
WATER STERILE IRR 1000ML POUR (IV SOLUTION) ×9 IMPLANT

## 2015-04-14 NOTE — Progress Notes (Signed)
Marc [name] [id number] DOB: 1940-06-29 DOA: 04/13/2015 PCP: [name], MD  Brief narrative: 75 y/o ? 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 Francene Finders doxycycline until one week ago when he could not get refill R Fem-POP bypass Htn DM ty II CAD Prior spinal stenosis Gout  AoCD CKD stg 2-3 LLL Lung nodule Chr Diastolic HF last EF Asc Aortic aneursym  Admitted with infection and was taken to OR for removal of infected hardware  Past medical history-As per Problem list Chart reviewed as below- Reviewed as above  Consultants:  ORtho [name]  Procedures:   Arthoscopic wash-out of R knee 3/17  Antibiotics:  Vancomycin 3/16  ZOsyn 3/16   Subjective   Pain 7/10, throbbing in knee Hungry Tells me ? Will need AKA   Objective    Interim History:   Telemetry: nad   Objective: Filed Vitals:   04/14/15 1221 04/14/15 1236 04/14/15 1251 04/14/15 1300  BP: 137/69 130/76 139/89 149/82  Pulse: 71 72 72 75  Temp:    97.5 F (36.4 C)  TempSrc:      Resp: 9 9 11 16   Weight:      SpO2: 100% 100% 100% 100%    Intake/Output Summary (Last 24 hours) at 04/14/15 1352 Last data filed at 04/14/15 1130  Gross per 24 hour  Intake   1590 ml  Output    850 ml  Net    740 ml    Exam:  General: alert flat  Cardiovascular: s1 s2 no m/r/g Respiratory: clear no added sound Abdomen: soft nt nd no rebound Skin wound covered Neuro intact  Data Reviewed: Basic Metabolic Panel:  Recent Labs Lab 04/13/15 1900 04/14/15 0652  NA 137 138  K 4.3 4.0  CL 95* 102  CO2 28 26  GLUCOSE 276* 212*  BUN 47* 42*  CREATININE 2.15* 1.71*  CALCIUM 9.8 9.1   Liver Function Tests: No results for input(s): AST, ALT, ALKPHOS, BILITOT, PROT, ALBUMIN in  the last 168 hours. No results for input(s): LIPASE, AMYLASE in the last 168 hours. No results for input(s): AMMONIA in the last 168 hours. CBC:  Recent Labs Lab 04/13/15 1900 04/14/15 0652  WBC 10.6* 7.9  NEUTROABS 7.7  --   HGB 11.2* 9.4*  HCT 35.0* 28.6*  MCV 96.7 96.3  PLT 277 215   Cardiac Enzymes: No results for input(s): CKTOTAL, CKMB, CKMBINDEX, TROPONINI in the last 168 hours. BNP: Invalid input(s): POCBNP CBG:  Recent Labs Lab 04/13/15 1849 04/13/15 2140 04/14/15 0631 04/14/15 0914 04/14/15 1139  GLUCAP 282* 185* 210* 169* 187*    Recent Results (from the past 240 hour(s))  MRSA PCR Screening     Status: None   Collection Time: 04/13/15 11:18 PM  Result Value Ref Range Status   MRSA by PCR NEGATIVE NEGATIVE Final    Comment:        The GeneXpert MRSA Assay (FDA approved for NASAL specimens only), is one component of a comprehensive MRSA colonization surveillance program. It is not intended to diagnose MRSA infection nor to guide or monitor treatment for MRSA infections.      Studies:              All Imaging reviewed and is as per above notation   Scheduled Meds: .  allopurinol  300 mg Oral BID  . amLODipine  5 mg Oral Daily  . aspirin EC  81 mg Oral QHS  . atorvastatin  10 mg Oral QHS  . clopidogrel  75 mg Oral QHS  . colchicine  0.6 mg Oral QHS  . collagenase  1 application Topical QODAY  . gabapentin  1,200 mg Oral BID  . insulin aspart  0-5 Units Subcutaneous QHS  . insulin aspart  0-9 Units Subcutaneous TID WC  . nicotine  21 mg Transdermal QHS  . pantoprazole  40 mg Oral Daily  . piperacillin-tazobactam (ZOSYN)  IV  3.375 g Intravenous Q8H  . predniSONE  20 mg Oral Q breakfast  . vancomycin  1,250 mg Intravenous Q24H   Continuous Infusions: . dextrose 5 % and 0.45 % NaCl with KCl 20 mEq/L    . lactated ringers 10 mL/hr at 04/14/15 0904     Assessment/Plan:  1. Complicated RLE wound in setting prior Fem-pops/p  Wash-out-Continue IV Vanc and zosyn for now-follow up Cultures from wash-out 3/17 to narrow.  Further dispo to Ortho-Started Dilaudid 1-2 q3prn severe breakthrough pain.  reviist in am. If eating well in am saline lock iv.  contasa 81, plavix 75 2. DM tyII-last A1c 7.6 on 03/19/15 cbg 187-242-cont SSI with sensitive coverage.  Hold lantus for now and adjust if prn in am.  Stop stress dose steroids.  At home on Amaryl 4 3 times a day-continue gabapentin 1200 twice a day for diabetic neuropathy 3. Spinal stenosis-on chr prednisone-will enquie in am if for COPD/Stenosis-need to taper to see if can promote healing 4. 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 Acute kidney injury-monitor Bme tin am.  ON admit Creat 2.15--->1.71 on 3/17.  PTA lasix 80 bid, Lisinopril 20 mg held 6. Gout-continue colchicine 0.6 daily, allopurinol 300 twice a day-may need to adjust for renal function 7. History of hypokalemia probably secondary to Lasix-potassium on hold 8. Hyperlipidemia-continue 10 mg daily at bedtime defer to PCP regarding dosage change given significant ASCVD   No family present Inpatient pending resolution Monitor on MedSurg    Verneita Griffes, MD  [facility] Pager [phone] 04/14/2015, 1:52 PM    LOS: 1 day

## 2015-04-14 NOTE — Interval H&P Note (Signed)
History and Physical Interval Note:  04/14/2015 9:24 AM  Marc [name]  has presented today for surgery, with the diagnosis of INFECTED RIGHT TOTAL KNEE  The various methods of treatment have been discussed with the patient and family. After consideration of risks, benefits and other options for treatment, the patient has consented to  Procedure(s): RIGHT KNEE [address] (Right) IRRIGATION AND DEBRIDEMENT KNEE (Right) as a surgical intervention .  The patient's history has been reviewed, patient examined, no change in status, stable for surgery.  I have reviewed the patient's chart and labs.  Questions were answered to the patient's satisfaction.     [name]

## 2015-04-14 NOTE — Progress Notes (Signed)
PT Cancellation Note  Patient Details Name: Marc [name] MRN: [id number] DOB: 1940-06-05   Cancelled Treatment:    Reason Eval/Treat Not Completed: Patient at procedure or test/unavailable;Patient not medically ready (To OR)   Duncan Dull 04/14/2015, 9:49 AM  Alben Deeds, PT DPT  [phone]

## 2015-04-14 NOTE — Consult Note (Addendum)
WOC consult requested for right foot wound.  Ortho service is now following for assessment and plan of care and pt is going to the OR today.  Please refer to their service for further questions. Please re-consult if further assistance is needed.  Thank-you,  Julien Girt MSN, [place], [place], [place], [place]

## 2015-04-14 NOTE — Anesthesia Procedure Notes (Signed)
Procedure Name: LMA Insertion Date/Time: 04/14/2015 9:47 AM Performed by: Salli Quarry Romesha Scherer Pre-anesthesia Checklist: Patient identified, Emergency Drugs available, Suction available and Patient being monitored Patient Re-evaluated:Patient Re-evaluated prior to inductionOxygen Delivery Method: Circle system utilized Preoxygenation: Pre-oxygenation with 100% oxygen Intubation Type: IV induction LMA: LMA inserted LMA Size: 5.0 Number of attempts: 1 Placement Confirmation: positive ETCO2 and breath sounds checked- equal and bilateral Tube secured with: Tape Dental Injury: Teeth and Oropharynx as per pre-operative assessment

## 2015-04-14 NOTE — Anesthesia Preprocedure Evaluation (Addendum)
Anesthesia Evaluation    Reviewed: Allergy & Precautions, NPO status , Patient's Chart, lab work & pertinent test results  History of Anesthesia Complications Negative for: history of anesthetic complications  Airway Mallampati: II  TM Distance: >3 FB Neck ROM: Full    Dental  (+) Edentulous Upper, Missing, Dental Advisory Given, Poor Dentition   Pulmonary COPD, Current Smoker,    breath sounds clear to auscultation       Cardiovascular hypertension, Pt. on medications + CAD and + Peripheral Vascular Disease (iliac stent, fem-peroneal)   Rhythm:Regular Rate:Normal  '13 Stress: NL LV Function; NL Wall Motion   Neuro/Psych PSYCHIATRIC DISORDERS Depression    GI/Hepatic Neg liver ROS, GERD  Medicated and Controlled,  Endo/Other  diabetes, Oral Hypoglycemic Agents  Renal/GU Renal InsufficiencyRenal disease (creat 1.78)     Musculoskeletal   Abdominal   Peds  Hematology  (+) Blood dyscrasia (Hb 8.6), ,   Anesthesia Other Findings   Reproductive/Obstetrics                            Anesthesia Physical  Anesthesia Plan  ASA: III  Anesthesia Plan: General   Post-op Pain Management:    Induction: Intravenous  Airway Management Planned: LMA  Additional Equipment:   Intra-op Plan:   Post-operative Plan:   Informed Consent: I have reviewed the patients History and Physical, chart, labs and discussed the procedure including the risks, benefits and alternatives for the proposed anesthesia with the patient or authorized representative who has indicated his/her understanding and acceptance.   Dental advisory given  Plan Discussed with: CRNA and Surgeon  Anesthesia Plan Comments:         Anesthesia Quick Evaluation

## 2015-04-14 NOTE — Progress Notes (Signed)
Orthopedic Tech Progress Note Patient Details:  Marc [name] 04-05-1940 [id number]  Patient ID: Marc [name], male   DOB: April 26, 1940, 75 y.o.   MRN: [id number]   [name] 04/14/2015, 3:00 PMTrapeze bar

## 2015-04-14 NOTE — Progress Notes (Signed)
Subjective: Marc [name] reports recurrent swelling and pain in his right total knee that was aspirated yielding 40 mL of purulent material yesterday. Patient is scheduled for irrigation debridement today. 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 He is presently on Zosyn and vancomycin without any adverse reaction.  Objective: Vital signs in last 24 hours: Temp:  [97.8 F (36.6 C)-99.3 F (37.4 C)] 97.8 F (36.6 C) (03/17 0501) Pulse Rate:  [56-95] 56 (03/17 0501) Resp:  [15-18] 18 (03/17 0501) BP: (137-149)/(66-90) 139/66 mmHg (03/17 0501) SpO2:  [98 %-100 %] 98 % (03/17 0501) Weight:  [81.421 kg (179 lb 8 oz)] 81.421 kg (179 lb 8 oz) (03/17 0501)  Intake/Output from previous day: 03/16 0701 - 03/17 0700 In: 790 [P.O.:240; IV Piggyback:550] Out: 600 [Urine:600] Intake/Output this shift:     Recent Labs  04/13/15 1900  HGB 11.2*    Recent Labs  04/13/15 1900  WBC 10.6*  RBC 3.62*  HCT 35.0*  PLT 277    Recent Labs  04/13/15 1900  NA 137  K 4.3  CL 95*  CO2 28  BUN 47*  CREATININE 2.15*  GLUCOSE 276*  CALCIUM 9.8    Recent Labs  04/13/15 1900  INR 1.04    Objective: The patient is a 2+ effusion in his right knee which is warm and tender to palpation. He continues to have venous stasis to the calf region the previous incisions for his bypass do not have any new swelling or erythema. The back is intact on his right foot.  Assessment/Plan: Assessment: Infected right total knee arthroplasty in a 75 year old man with multiple comorbidities of most concern is his recent femoral artery bypass in his right thigh and leg with the distal anastomosis very close to his total knee.  Plan: Situation was discussed with Dr. Joylene Igo, vascular surgery, and there is a significant concern for damage to the  anastomosis if the knee is dislocated which is a requirement for removal of the prostheses. If the anastomosis is damaged he will require above-knee amputation at that point. This was discussed at length with the patient. The plan today will be for an open arthrotomy with irrigation and debridement of all tissues if the tibial bearing can be removed without too much difficulty this will be accomplished to aid in the irrigation and debridement. We will avoid dislocating the tibiofemoral joint and we will try to avoid usage of a tourniquet if possible. Afterwards the patient will receive IV antibiotics and we will attempt to suppress the infection going forward. If this fails the final step will be above-knee amputation. Patient understands that this is not a routine treatment for an infected total knee but routine treatment would probably result in much to his most recent anastomosis and loss of his leg.  Marc [name] 04/14/2015, 7:45 AM

## 2015-04-14 NOTE — H&P (View-Only) (Signed)
Subjective: Marc [name] reports recurrent swelling and pain in his right total knee that was aspirated yielding 40 mL of purulent material yesterday. Patient is scheduled for irrigation debridement today. 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 He is presently on Zosyn and vancomycin without any adverse reaction.  Objective: Vital signs in last 24 hours: Temp:  [97.8 F (36.6 C)-99.3 F (37.4 C)] 97.8 F (36.6 C) (03/17 0501) Pulse Rate:  [56-95] 56 (03/17 0501) Resp:  [15-18] 18 (03/17 0501) BP: (137-149)/(66-90) 139/66 mmHg (03/17 0501) SpO2:  [98 %-100 %] 98 % (03/17 0501) Weight:  [81.421 kg (179 lb 8 oz)] 81.421 kg (179 lb 8 oz) (03/17 0501)  Intake/Output from previous day: 03/16 0701 - 03/17 0700 In: 790 [P.O.:240; IV Piggyback:550] Out: 600 [Urine:600] Intake/Output this shift:     Recent Labs  04/13/15 1900  HGB 11.2*    Recent Labs  04/13/15 1900  WBC 10.6*  RBC 3.62*  HCT 35.0*  PLT 277    Recent Labs  04/13/15 1900  NA 137  K 4.3  CL 95*  CO2 28  BUN 47*  CREATININE 2.15*  GLUCOSE 276*  CALCIUM 9.8    Recent Labs  04/13/15 1900  INR 1.04    Objective: The patient is a 2+ effusion in his right knee which is warm and tender to palpation. He continues to have venous stasis to the calf region the previous incisions for his bypass do not have any new swelling or erythema. The back is intact on his right foot.  Assessment/Plan: Assessment: Infected right total knee arthroplasty in a 74 year old man with multiple comorbidities of most concern is his recent femoral artery bypass in his right thigh and leg with the distal anastomosis very close to his total knee.  Plan: Situation was discussed with Dr. Joylene Igo, vascular surgery, and there is a significant concern for damage to the  anastomosis if the knee is dislocated which is a requirement for removal of the prostheses. If the anastomosis is damaged he will require above-knee amputation at that point. This was discussed at length with the patient. The plan today will be for an open arthrotomy with irrigation and debridement of all tissues if the tibial bearing can be removed without too much difficulty this will be accomplished to aid in the irrigation and debridement. We will avoid dislocating the tibiofemoral joint and we will try to avoid usage of a tourniquet if possible. Afterwards the patient will receive IV antibiotics and we will attempt to suppress the infection going forward. If this fails the final step will be above-knee amputation. Patient understands that this is not a routine treatment for an infected total knee but routine treatment would probably result in much to his most recent anastomosis and loss of his leg.  Marc [name] J 04/14/2015, 7:45 AM

## 2015-04-14 NOTE — Progress Notes (Signed)
OT Cancellation Note  Patient Details Name: Marc [name] MRN: [id number] DOB: 1940-08-17   Cancelled Treatment:    Reason Eval/Treat Not Completed: Patient at procedure or test/ unavailable (OR for wound debridement).  Redmond Baseman, OTR/L Pager: [phone] 04/14/2015, 9:46 AM

## 2015-04-14 NOTE — Transfer of Care (Signed)
Immediate Anesthesia Transfer of Care Note  Patient: Marc [name]  Procedure(s) Performed: Procedure(s): RIGHT KNEE [place] OUT AND PLACEMENT OF DRAINS (Right) IRRIGATION AND DEBRIDEMENT KNEE (Right)  Patient Location: PACU  Anesthesia Type:General  Level of Consciousness: awake, alert  and patient cooperative  Airway & Oxygen Therapy: Patient Spontanous Breathing and Patient connected to face mask oxygen  Post-op Assessment: Report given to RN, Post -op Vital signs reviewed and stable and Patient moving all extremities  Post vital signs: Reviewed and stable  Last Vitals:  Filed Vitals:   04/14/15 0501 04/14/15 0830  BP: 139/66 131/77  Pulse: 56   Temp: 36.6 C 36.9 C  Resp: 18 17    Complications: No apparent anesthesia complications

## 2015-04-14 NOTE — Op Note (Signed)
DATE OF PROCEDURE: 10/09/2012  PREOPERATIVE DIAGNOSIS: Infected Right Total Knee  Estimated body mass index is 35.23 kg/(m^2) as calculated from the following:  Height as of this encounter: 6' 2.5" (1.892 m).  Weight as of this encounter: 126.1 kg (278 lb).  POSTOPERATIVE DIAGNOSIS: Infected Right Total Knee  PROCEDURE: Radical irrigation and debridement of infected total knee arthroplasty with revision of DePuy tibial component rotating platform #5, 10 mm bearing  SURGEON: Helen Cuff J  ASSISTANT: Eric K. Barton Dubois (present throughout entire procedure and necessary for timely completion of the procedure)  ANESTHESIA: GET  DRAINS: foley, 2 medium hemovac in knee joint. TOURNIQUET TIME: none  COMPLICATIONS: None SPECIMENS: Infected Synovial fluid for Gram stain and culture.  INDICATIONS FOR PROCEDURE: Patient is status post total knee arthroplasty 2015, did well until for 5 days ago when he had spontaneous swelling erythema and pain in his total knee. His past medical history is complex and that he has severe peripheral vascular disease and poorly controlled diabetes.he presented to our office in January with necrosis of his right great toe was evaluated by his vascular team with Dr. Doren Custard, had bypass surgery with the hookup just behind the knee at the trifurcation of the vessels on 02/07/2015. He also had right first ray amputation for the dry gangrene, had a re-debridement in February with application of a VAC. He was on doxycycline until about 11 days ago when his prescription ran out and he says he was unable to get a refill. A week later is 1 the right total knee became swollen and tender and inflamed. He presented to our office yesterday we aspirated 40 mL of yellow and grayish purulent material that has been sent off for Gram stain and culture. He also has a long history of tobacco abuse smoking at least one pack of cigarettes a day, his social situation is such that he lives by himself  although his ex-wife does help him with care some. Ideally we would be doing removal of implants and placement of a PMMA spacer but with his recent vascular surgery and anastomosis so close to his implants there would be great risk of damage to the anastomosis if we attempted to remove the tibial implant. Because of thiswe are going to do irrigation and debridement with exchange of the tibial implant bearing but leaving the metal implants where they are unless they are loose. By x-ray they are not loose.he risks and benefits of surgery been discussed at length with the patient. if this fails he will probably require above-knee amputation.    DESCRIPTION OF PROCEDURE: The patient identified by armband, and taken to the operating room, appropriate anesthetic  monitors were attached General endotracheal anesthesia induced with  the patient in supine position, Foley catheter was inserted. Tourniquet  applied high to the operative thigh, but never used throughout the procedure. Lateral post and foot positioner  applied to the table, the lower extremity was then prepped and draped  in usual sterile fashion from the ankle to the tourniquet. Time-out procedure was performed.  We began the operation, with the knee flexed to 110 by making the anterior midline incision starting at handbreadth above the patella going over the patella 1 cm medial to and  6 cm distal to the tibial tubercle, reproducing the old incision. The skin and subcutaneous tissue were noninflamed. It was not until we incised the medial border of the quadriceps tendon that we encountered bloody purulent material from the joint. The medial parapatellar arthrotomy was  taken down along the medial aspect of the patellar tendon and extended to just medial to the tibial tubercle. All this was accomplished with the knee flexed to minimize bleeding. We remove synovium from the medial side of the wound and also from the area just behind the patellar  tendon. The knee was then placed extension the patella everted and then flexed again allowing [place] to complete a fairly radical synovectomy, medially, laterally, superiorly.we then externally rotated the tibia and this allowed [place] to partially subluxed the tibia from beneath the femur and remove the tibial bearing. We then continued our synovectomy posteriorly. Overall the infection looked to be relatively early there was little if any fibrinous material and primarily bloody. All material. The wound was then sequentiallyirrigated with pulse lavage, hydrogen peroxide, and Clorpactin for 3 full cycles  Consuming 6 L of normal saline pulse lavage. The knee was then placed in full extension with a lamina spreader to make sure there were no active bleeders.satisfied with the irrigation and debridement the knee was once again hyperflexed allowing placement of a new Depuy Sigma #5 10 mm tibial bearing.we then took the knee through range of motion make sure there is no subluxation.  Medium Hemovacs are placed into the joint from a superior lateral incision in the skin. With the knee flexed with then closed the parapatellar arthrotomy with running #1 Vicryl suture and the skin and subcutaneous tissue with running 3-0 Vicryl suture. A dressing of Xerofoam 4 x 4 dressing sponges web roll and Ace wrap and a small knee immobilizer were then placed. The patient was awakened extubated and taken to the recovery room.  Frederik Pear J  10/09/2012, 2:57 PM

## 2015-04-15 DIAGNOSIS — Z8614 Personal history of Methicillin resistant Staphylococcus aureus infection: Secondary | ICD-10-CM

## 2015-04-15 DIAGNOSIS — Z89411 Acquired absence of right great toe: Secondary | ICD-10-CM

## 2015-04-15 DIAGNOSIS — S91301A Unspecified open wound, right foot, initial encounter: Secondary | ICD-10-CM

## 2015-04-15 LAB — GLUCOSE, CAPILLARY
GLUCOSE-CAPILLARY: 243 mg/dL — AB (ref 65–99)
GLUCOSE-CAPILLARY: 215 mg/dL — AB (ref 65–99)
GLUCOSE-CAPILLARY: 402 mg/dL — AB (ref 65–99)
GLUCOSE-CAPILLARY: 349 mg/dL — AB (ref 65–99)

## 2015-04-15 LAB — BASIC METABOLIC PANEL
ANION GAP: 10 (ref 5–15)
BUN: 33 mg/dL — ABNORMAL HIGH (ref 6–20)
CALCIUM: 8.5 mg/dL — AB (ref 8.9–10.3)
CO2: 24 mmol/L (ref 22–32)
Chloride: 104 mmol/L (ref 101–111)
Creatinine, Ser: 1.7 mg/dL — ABNORMAL HIGH (ref 0.61–1.24)
GFR, EST AFRICAN AMERICAN: 44 mL/min — AB (ref >60)
GFR, EST NON AFRICAN AMERICAN: 38 mL/min — AB (ref >60)
Glucose, Bld: 237 mg/dL — ABNORMAL HIGH (ref 65–99)
POTASSIUM: 5 mmol/L (ref 3.5–5.1)
Sodium: 138 mmol/L (ref 135–145)

## 2015-04-15 LAB — UREA NITROGEN, URINE: Urea Nitrogen, Ur: 586 mg/dL

## 2015-04-15 MED ORDER — INSULIN DETEMIR 100 UNIT/ML ~~LOC~~ SOLN
7.0000 [IU] | Freq: Every day | SUBCUTANEOUS | Status: DC
  Administered 2015-04-15 – 2015-04-17 (×3): 7 [IU] via SUBCUTANEOUS
  Filled 2015-04-15 (×4): qty 0.07

## 2015-04-15 MED ORDER — INSULIN ASPART 100 UNIT/ML ~~LOC~~ SOLN: 3.0000 [IU] | Freq: Three times a day (TID) | SUBCUTANEOUS | Status: DC

## 2015-04-15 MED ORDER — OXYCODONE-ACETAMINOPHEN 5-325 MG PO TABS
2.0000 | ORAL_TABLET | ORAL | Status: DC
  Administered 2015-04-15 – 2015-04-18 (×14): 2 via ORAL
  Filled 2015-04-15 (×14): qty 2

## 2015-04-15 MED ORDER — HYDROMORPHONE HCL 1 MG/ML IJ SOLN
0.5000 mg | INTRAMUSCULAR | Status: DC | PRN
  Administered 2015-04-15 – 2015-04-16 (×3): 1 mg via INTRAVENOUS
  Filled 2015-04-15 (×3): qty 1

## 2015-04-15 MED ORDER — SODIUM CHLORIDE 0.9% FLUSH
10.0000 mL | INTRAVENOUS | Status: DC | PRN
  Administered 2015-04-16 – 2015-04-18 (×2): 10 mL
  Filled 2015-04-15 (×2): qty 40

## 2015-04-15 MED ORDER — INSULIN ASPART 100 UNIT/ML ~~LOC~~ SOLN: 0.0000 [IU] | Freq: Three times a day (TID) | SUBCUTANEOUS | Status: DC

## 2015-04-15 MED ORDER — INSULIN ASPART 100 UNIT/ML ~~LOC~~ SOLN
0.0000 [IU] | Freq: Three times a day (TID) | SUBCUTANEOUS | Status: DC
  Administered 2015-04-15: 15 [IU] via SUBCUTANEOUS
  Administered 2015-04-16: 3 [IU] via SUBCUTANEOUS
  Administered 2015-04-16: 8 [IU] via SUBCUTANEOUS
  Administered 2015-04-16: 5 [IU] via SUBCUTANEOUS
  Administered 2015-04-17: 11 [IU] via SUBCUTANEOUS
  Administered 2015-04-17 (×2): 3 [IU] via SUBCUTANEOUS
  Administered 2015-04-18 (×2): 2 [IU] via SUBCUTANEOUS

## 2015-04-15 MED ORDER — POLYETHYLENE GLYCOL 3350 17 G PO PACK
17.0000 g | PACK | Freq: Every day | ORAL | Status: DC
  Administered 2015-04-15 – 2015-04-18 (×4): 17 g via ORAL
  Filled 2015-04-15 (×3): qty 1

## 2015-04-15 MED ORDER — SENNOSIDES-DOCUSATE SODIUM 8.6-50 MG PO TABS
1.0000 | ORAL_TABLET | Freq: Every day | ORAL | Status: DC
  Administered 2015-04-15 – 2015-04-17 (×3): 1 via ORAL
  Filled 2015-04-15 (×3): qty 1

## 2015-04-15 MED ORDER — INSULIN ASPART 100 UNIT/ML ~~LOC~~ SOLN
3.0000 [IU] | Freq: Three times a day (TID) | SUBCUTANEOUS | Status: DC
  Administered 2015-04-16 – 2015-04-18 (×8): 3 [IU] via SUBCUTANEOUS

## 2015-04-15 NOTE — Progress Notes (Signed)
PATIENT ID: Marc [name]  MRN: [id number]  DOB/AGE:  [birth year] / 75 y.o.  1 Day Post-Op Procedure(s) (LRB): RIGHT KNEE WASH OUT AND PLACEMENT OF DRAINS (Right) IRRIGATION AND DEBRIDEMENT KNEE (Right)    PROGRESS NOTE Subjective: Patient is alert, oriented, no Nausea, no Vomiting, yes passing gas. Taking PO well. Denies SOB, Chest or Calf Pain. Using Incentive Spirometer, PAS in place. Ambulate WBAT with knee immobilizer, No CPM Patient reports pain as 7/10 .    Objective: Vital signs in last 24 hours: Filed Vitals:   04/14/15 1300 04/14/15 2118 04/15/15 0218 04/15/15 0546  BP: 149/82 121/84 120/71 125/74  Pulse: 75 104 88 81  Temp: 97.5 F (36.4 C) 98.4 F (36.9 C) 99.1 F (37.3 C) 98.4 F (36.9 C)  TempSrc:  Oral Oral Oral  Resp: 16 18 18 18   Weight:    83.779 kg (184 lb 11.2 oz)  SpO2: 100% 92% 99% 100%      Intake/Output from previous day: I/O last 3 completed shifts: In: 1593.3 [I.V.:1593.3] Out: [id number] [Urine:901; Drains:630; Blood:250]   Intake/Output this shift:     LABORATORY DATA:  Recent Labs  04/13/15 1900  04/14/15 0652  04/14/15 1645 04/14/15 2115 04/15/15 0627  WBC 10.6*  --  7.9  --   --   --   --   HGB 11.2*  --  9.4*  --   --   --   --   HCT 35.0*  --  28.6*  --   --   --   --   PLT 277  --  215  --   --   --   --   NA 137  --  138  --   --   --   --   K 4.3  --  4.0  --   --   --   --   CL 95*  --  102  --   --   --   --   CO2 28  --  26  --   --   --   --   BUN 47*  --  42*  --   --   --   --   CREATININE 2.15*  --  1.71*  --   --   --   --   GLUCOSE 276*  --  212*  --   --   --   --   GLUCAP  --   < >  --   < > 242* 254* 243*  INR 1.04  --   --   --   --   --   --   CALCIUM 9.8  --  9.1  --   --   --   --   < > = values in this interval not displayed.  Examination: Neurologically intact Neurovascular intact Sensation intact distally Dorsiflexion/Plantar flexion intact Incision: moderate drainage No cellulitis  present Compartment soft}  Assessment:   1 Day Post-Op Procedure(s) (LRB): RIGHT KNEE WASH OUT AND PLACEMENT OF DRAINS (Right) IRRIGATION AND DEBRIDEMENT KNEE (Right) ADDITIONAL DIAGNOSIS: Expected Acute Blood Loss Anemia, Diabetes, Hypertension, Renal Insufficiency Chronic and Gout and tobacco abuse disorder  Plan: PT/OT WBAT, No CPM.  Pt to remain in knee immobilizer with therapy DVT Prophylaxis:  SCDx72hrs, ASA 325 mg BID x 2 weeks DISCHARGE PLAN: Home DISCHARGE NEEDS: HHPT, HHRN, Walker, 3-in-1 comode seat and IV Antibiotics  Consult Infectious Disease     Johntavius Shepard R 04/15/2015,  8:29 AM

## 2015-04-15 NOTE — Progress Notes (Signed)
Pt with incontinent episode x1. While cleaning, nursing noticed what appeared to be a scabbed area on pts sacrum. Pt stated that it had been there since christmas and was improving. Area blanchable, no break in pts skin at this time. Pt educated on the importance of turning q2h and staying dry. Pt refusing to be turned every 2 hours. Pt did agree to a condom catheter to help maintain skin integrity. Upon further researching through pts chart, this RN noticed that previous RN's had documented the same area as a stage 2 pressure ulcer in February 2017. Nursing will continue to monitor.

## 2015-04-15 NOTE — Progress Notes (Signed)
Marc [name] [id number] DOB: 1940/03/25 DOA: 04/13/2015 PCP: [name], MD  Brief narrative: 75 y/o ? 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 Lung nodule Chr Diastolic HF last EF Asc Aortic aneursym  Admitted with infection and was taken to OR for removal of infected hardware  Past medical history-As per Problem list Chart reviewed as below- Reviewed as above  Consultants:  ORtho [name]  Procedures:   Arthoscopic wash-out of R knee 3/17  Antibiotics:  Vancomycin 3/16  ZOsyn 3/16   Subjective   Pain 11/10 Upset today about prospect of RLE amputation Many questions about cause Poor insight into smoking's direct role regarding wound healing   Objective    Interim History:   Telemetry: nad   Objective: Filed Vitals:   04/14/15 1300 04/14/15 2118 04/15/15 0218 04/15/15 0546  BP: 149/82 121/84 120/71 125/74  Pulse: 75 104 88 81  Temp: 97.5 F (36.4 C) 98.4 F (36.9 C) 99.1 F (37.3 C) 98.4 F (36.9 C)  TempSrc:  Oral Oral Oral  Resp: 16 18 18 18   Weight:    83.779 kg (184 lb 11.2 oz)  SpO2: 100% 92% 99% 100%    Intake/Output Summary (Last 24 hours) at 04/15/15 1726 Last data filed at 04/15/15 [id number]  Gross per 24 hour  Intake 793.33 ml  Output    931 ml  Net -137.67 ml    Exam:  General: alert flat  Cardiovascular: s1 s2 no m/r/g Respiratory: clear no added sound Abdomen: soft nt nd no rebound Skin wound covered Neuro intact  Data Reviewed: Basic Metabolic Panel:  Recent Labs Lab 04/13/15 1900 04/14/15 0652 04/15/15 0540  NA 137 138 138  K 4.3 4.0 5.0  CL 95* 102 104  CO2 28  26 24   GLUCOSE 276* 212* 237*  BUN 47* 42* 33*  CREATININE 2.15* 1.71* 1.70*  CALCIUM 9.8 9.1 8.5*   Liver Function Tests: No results for input(s): AST, ALT, ALKPHOS, BILITOT, PROT, ALBUMIN in the last 168 hours. No results for input(s): LIPASE, AMYLASE in the last 168 hours. No results for input(s): AMMONIA in the last 168 hours. CBC:  Recent Labs Lab 04/13/15 1900 04/14/15 0652  WBC 10.6* 7.9  NEUTROABS 7.7  --   HGB 11.2* 9.4*  HCT 35.0* 28.6*  MCV 96.7 96.3  PLT 277 215   Cardiac Enzymes: No results for input(s): CKTOTAL, CKMB, CKMBINDEX, TROPONINI in the last 168 hours. BNP: Invalid input(s): POCBNP CBG:  Recent Labs Lab 04/14/15 1645 04/14/15 2115 04/15/15 0627 04/15/15 1224 04/15/15 1715  GLUCAP 242* 254* 243* 215* 402*    Recent Results (from the past 240 hour(s))  Culture, blood (Routine X 2) w Reflex to ID Panel     Status: None (Preliminary result)   Collection Time: 04/13/15  9:11 PM  Result Value Ref Range Status   Specimen Description BLOOD LEFT HAND  Final   Special Requests BOTTLES DRAWN AEROBIC AND ANAEROBIC 5CC  Final   Culture NO GROWTH 2 DAYS  Final   Report Status PENDING  Incomplete  Culture, blood (Routine X 2) w Reflex to ID Panel     Status: None (  Preliminary result)   Collection Time: 04/13/15  9:27 PM  Result Value Ref Range Status   Specimen Description BLOOD RIGHT WRIST  Final   Special Requests IN PEDIATRIC BOTTLE 2CC  Final   Culture NO GROWTH 2 DAYS  Final   Report Status PENDING  Incomplete  MRSA PCR Screening     Status: None   Collection Time: 04/13/15 11:18 PM  Result Value Ref Range Status   MRSA by PCR NEGATIVE NEGATIVE Final    Comment:        The GeneXpert MRSA Assay (FDA approved for NASAL specimens only), is one component of a comprehensive MRSA colonization surveillance program. It is not intended to diagnose MRSA infection nor to guide or monitor treatment for MRSA infections.   Body fluid culture      Status: None (Preliminary result)   Collection Time: 04/14/15 10:14 AM  Result Value Ref Range Status   Specimen Description FLUID RIGHT KNEE  Final   Special Requests NONE  Final   Gram Stain   Final    MODERATE WBC PRESENT, PREDOMINANTLY PMN NO ORGANISMS SEEN    Culture NO GROWTH < 24 HOURS  Final   Report Status PENDING  Incomplete     Studies:              All Imaging reviewed and is as per above notation   Scheduled Meds: . allopurinol  300 mg Oral BID  . amLODipine  5 mg Oral Daily  . aspirin EC  81 mg Oral QHS  . atorvastatin  10 mg Oral QHS  . clopidogrel  75 mg Oral QHS  . colchicine  0.6 mg Oral QHS  . collagenase  1 application Topical QODAY  . gabapentin  1,200 mg Oral BID  . insulin aspart  0-5 Units Subcutaneous QHS  . insulin aspart  0-9 Units Subcutaneous TID WC  . nicotine  21 mg Transdermal QHS  . oxyCODONE-acetaminophen  2 tablet Oral Q4H  . pantoprazole  40 mg Oral Daily  . piperacillin-tazobactam (ZOSYN)  IV  3.375 g Intravenous Q8H  . predniSONE  20 mg Oral Q breakfast  . vancomycin  1,250 mg Intravenous Q24H   Continuous Infusions: . dextrose 5 % and 0.45 % NaCl with KCl 20 mEq/L 100 mL/hr at 04/14/15 2219  . lactated ringers 10 mL/hr at 04/14/15 0904     Assessment/Plan:  1. Complicated RLE wound in setting prior Fem-pops/p Wash-out-Continue IV Vanc and zosyn -Appreicate input from ID Dr. Baxter Flattery.  PICC order placed-follow up Cultures from wash-out 3/17 to narrow.  Further dispo to Ortho-Started Dilaudid 1-2 q3prn severe breakthrough pain--Changed PRN to scheduled q 4 hourly percocet 3.18.17.  saline lock iv.  Cont asa 81, plavix 75 2. DM tyII-last A1c 7.6 on 03/19/15 cbg 215-402.  Patient eating [place] fries-cont SSI with sensitive coverage.  start lantus 5U on 3.18.  D/c D5 with Potassium peri-op fluids.  Stopped stress dose steroids 3/17-->prednisone home dose 20.  At home on Amaryl 4 3 times a day-continue gabapentin 1200 twice a day for diabetic  neuropathy 3. Spinal stenosis-on chr prednisone-Stenosis-need to taper to see if can promote healing moving forward 4. 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 Acute kidney injury-monitor Bmet in am.  On admit Creat 2.15--->1.71 on 3/17.  PTA lasix 80 bid, Lisinopril 20 mg held--stable and improving 6. Gout-continue colchicine 0.6 daily, allopurinol 300 twice a day-may need to adjust for renal function 7. History  of hypokalemia probably secondary to Lasix-potassium on hold, d/c D5 with K 8. Hyperlipidemia-continue 10 mg daily at bedtime defer to PCP regarding dosage change given significant ASCVD   No family present Inpatient pending resolution and ID work-up Monitor on MedSurg    Verneita Griffes, MD  [facility] Pager [phone] 04/15/2015, 5:26 PM    LOS: 2 days

## 2015-04-15 NOTE — Consult Note (Addendum)
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 Active Problems:   Hx of peptic ulcer   Tobacco abuse disorder   Spinal stenosis of lumbar region   Type 2 diabetes mellitus with peripheral neuropathy (HCC)   Gout   Hyperlipidemia   GERD (gastroesophageal reflux disease)   Peripheral vascular disease with claudication (HCC)   Chronic kidney disease stage 3   CAD (coronary artery disease), native coronary artery   Ascending aortic aneurysm (HCC)   Acute renal failure superimposed on stage 3 chronic kidney disease (HCC)   Right foot infection   Septic joint of right knee joint (HCC)   Infection of total knee replacement (HCC)    HPI: Marc [name] is a 75 y.o. male with type 2 DM, severe PVD, CKD3, CAD, smoker who has hx of right TKA 2015, most recently had vascular surgery of right femoral to tibial peroneal trunk bypass with vein graft, endarectomy of tibial peroneal trunk with vein patch angioplasty and  first ray amputation and placement of wound vac for right great toe dry gangrene on 1/10. He had post operative swelling of right leg and poor wound healing of right foot wound thus on 2/21 underwent I x D, plus 2nd toe ray amputation and wound vac placement. OR cultures grew MRSA and he was discharged on doxycycline for 2 wk which he completed in early March. Thus while off of abtx, he started to have swelling , erythema, and pain to right prosthetic knee join starting on 3/12, he went to Dr. [name] for evaluation as an outpatient and underwent arthrocentesis which was concerning for infection given cloudy fluid. (unclear of cell coutn and culture results). Initially discussed 2 staged revision though the anastomosis of bypass is near his knee that it would likely be  compromised if doing 2 staged revision. He was admitted on 3/17 for I x D, exchange of tibial bearing components. OR note suggests early infection,as does clinical history.  The patient reports still having significant leg pain from knee down.   Past Medical History  Diagnosis Date  . Hypertension   . Ulcer disease   . Colon polyp   . Gout   . Bradycardia   . Hypercholesteremia   . PAD (peripheral artery disease) ([place])   . Chronic bronchitis ([place])   . Type II diabetes mellitus ([place])   . History of GI bleed 2007  . Gout   . Tobacco abuse disorder 03/10/2012  . GERD (gastroesophageal reflux disease)   . Pneumonia     denies  . ARF (acute renal failure) ([place]) 12/2014  . Neuropathy ([place])   . Arthritis   . Anemia     low iron  . Depression     occassional.  Situational  . History of blood transfusion   . CKD (chronic kidney disease), stage III     Allergies: No Known Allergies   MEDICATIONS: . allopurinol  300 mg Oral BID  . amLODipine  5 mg Oral Daily  . aspirin EC  81 mg Oral QHS  . atorvastatin  10 mg Oral QHS  . clopidogrel  75 mg Oral QHS  . colchicine  0.6 mg Oral QHS  .  collagenase  1 application Topical QODAY  . gabapentin  1,200 mg Oral BID  . insulin aspart  0-5 Units Subcutaneous QHS  . insulin aspart  0-9 Units Subcutaneous TID WC  . nicotine  21 mg Transdermal QHS  . pantoprazole  40 mg Oral Daily  . piperacillin-tazobactam (ZOSYN)  IV  3.375 g Intravenous Q8H  . predniSONE  20 mg Oral Q breakfast  . vancomycin  1,250 mg Intravenous Q24H    Social History  Substance Use Topics  . Smoking status: Current Every Day Smoker -- 1.00 packs/day for 58 years    Types: Cigarettes  . Smokeless tobacco: Former Systems developer    Types: Chew     Comment: Pt states he's going to use the cigarettes he has today (02/07/15) and will quit  . Alcohol Use: No    Family History  Problem Relation Age of Onset  . Colon cancer Paternal Uncle     Uncle  . Heart disease Father     . Hypertension Father   . Heart attack Father   . Heart disease Mother   . Diabetes Mother   . Hypertension Mother    Review of Systems  Constitutional: Negative for fever, chills, diaphoresis, activity change, appetite change, fatigue and unexpected weight change.  HENT: Negative for congestion, sore throat, rhinorrhea, sneezing, trouble swallowing and sinus pressure.  Eyes: Negative for photophobia and visual disturbance.  Respiratory: Negative for cough, chest tightness, shortness of breath, wheezing and stridor.  Cardiovascular: Negative for chest pain, palpitations and leg swelling.  Gastrointestinal: Negative for nausea, vomiting, abdominal pain, diarrhea, constipation, blood in stool, abdominal distention and anal bleeding.  Genitourinary: Negative for dysuria, hematuria, flank pain and difficulty urinating.  Musculoskeletal: + right knee pain and right foot pain Skin: Negative for color change, pallor, rash and wound.  Neurological: Negative for dizziness, tremors, weakness and light-headedness.  Hematological: Negative for adenopathy. Does not bruise/bleed easily.  Psychiatric/Behavioral: Negative for behavioral problems, confusion, sleep disturbance, dysphoric mood, decreased concentration and agitation.     OBJECTIVE: Temp:  [97.5 F (36.4 C)-99.1 F (37.3 C)] 98.4 F (36.9 C) (03/18 0546) Pulse Rate:  [75-104] 81 (03/18 0546) Resp:  [16-18] 18 (03/18 0546) BP: (120-149)/(71-84) 125/74 mmHg (03/18 0546) SpO2:  [92 %-100 %] 100 % (03/18 0546) Weight:  [184 lb 11.2 oz (83.779 kg)] 184 lb 11.2 oz (83.779 kg) (03/18 0546) Physical Exam  Constitutional: He is oriented to person, place, and time. He appears well-developed and well-nourished. No distress.  HENT:  Mouth/Throat: Oropharynx is clear and moist. No oropharyngeal exudate. Poor dentition Cardiovascular: Normal rate, regular rhythm and normal heart sounds. Exam reveals no gallop and no friction rub.  No murmur  heard.  Pulmonary/Chest: Effort normal and breath sounds normal. No respiratory distress. He has no wheezes.  Abdominal: Soft. Bowel sounds are normal. He exhibits no distension. There is no tenderness.  Lymphadenopathy:  He has no cervical adenopathy.  Ext: right knee wrapped with accordion drain in place. Right foot has wound vac in place. Toes are warm. Skin: Skin is warm and dry. No rash noted. No erythema. Onychomycosis of right foot toenails Psychiatric: He has a normal mood and affect. His behavior is normal.     LABS: Results for orders placed or performed during the hospital encounter of 04/13/15 (from the past 48 hour(s))  Type and screen [facility] tomorrow     Status: None   Collection Time: 04/13/15  6:49 PM  Result Value Ref  Range   ABO/RH(D) O NEG    Antibody Screen NEG    Sample Expiration 04/16/2015   Glucose, capillary     Status: Abnormal   Collection Time: 04/13/15  6:49 PM  Result Value Ref Range   Glucose-Capillary 282 (H) 65 - 99 mg/dL   Comment 1 Notify RN   CBC with Differential/Platelet     Status: Abnormal   Collection Time: 04/13/15  7:00 PM  Result Value Ref Range   WBC 10.6 (H) 4.0 - 10.5 K/uL   RBC 3.62 (L) 4.22 - 5.81 MIL/uL   Hemoglobin 11.2 (L) 13.0 - 17.0 g/dL   HCT 35.0 (L) 39.0 - 52.0 %   MCV 96.7 78.0 - 100.0 fL   MCH 30.9 26.0 - 34.0 pg   MCHC 32.0 30.0 - 36.0 g/dL   RDW 14.8 11.5 - 15.5 %   Platelets 277 150 - 400 K/uL   Neutrophils Relative % 72 %   Neutro Abs 7.7 1.7 - 7.7 K/uL   Lymphocytes Relative 22 %   Lymphs Abs 2.3 0.7 - 4.0 K/uL   Monocytes Relative 5 %   Monocytes Absolute 0.5 0.1 - 1.0 K/uL   Eosinophils Relative 1 %   Eosinophils Absolute 0.1 0.0 - 0.7 K/uL   Basophils Relative 0 %   Basophils Absolute 0.0 0.0 - 0.1 K/uL  Basic metabolic panel     Status: Abnormal   Collection Time: 04/13/15  7:00 PM  Result Value Ref Range   Sodium 137 135 - 145 mmol/L   Potassium 4.3 3.5 - 5.1 mmol/L    Chloride 95 (L) 101 - 111 mmol/L   CO2 28 22 - 32 mmol/L   Glucose, Bld 276 (H) 65 - 99 mg/dL   BUN 47 (H) 6 - 20 mg/dL   Creatinine, Ser 2.15 (H) 0.61 - 1.24 mg/dL   Calcium 9.8 8.9 - 10.3 mg/dL   GFR calc non Af Amer 28 (L) >60 mL/min   GFR calc Af Amer 33 (L) >60 mL/min    Comment: (NOTE) The eGFR has been calculated using the CKD EPI equation. This calculation has not been validated in all clinical situations. eGFR's persistently <60 mL/min signify possible Chronic Kidney Disease.    Anion gap 14 5 - 15  APTT     Status: None   Collection Time: 04/13/15  7:00 PM  Result Value Ref Range   aPTT 34 24 - 37 seconds  Protime-INR     Status: None   Collection Time: 04/13/15  7:00 PM  Result Value Ref Range   Prothrombin Time 13.8 11.6 - 15.2 seconds   INR 1.04 0.00 - 1.49  Procalcitonin     Status: None   Collection Time: 04/13/15  8:51 PM  Result Value Ref Range   Procalcitonin 0.13 ng/mL    Comment:        Interpretation: PCT (Procalcitonin) <= 0.5 ng/mL: Systemic infection (sepsis) is not likely. Local bacterial infection is possible. (NOTE)         ICU PCT Algorithm               Non ICU PCT Algorithm    ----------------------------     ------------------------------         PCT < 0.25 ng/mL                 PCT < 0.1 ng/mL     Stopping of antibiotics            Stopping  of antibiotics       strongly encouraged.               strongly encouraged.    ----------------------------     ------------------------------       PCT level decrease by               PCT < 0.25 ng/mL       >= 80% from peak PCT       OR PCT 0.25 - 0.5 ng/mL          Stopping of antibiotics                                             encouraged.     Stopping of antibiotics           encouraged.    ----------------------------     ------------------------------       PCT level decrease by              PCT >= 0.25 ng/mL       < 80% from peak PCT        AND PCT >= 0.5 ng/mL            Continuin g  antibiotics                                              encouraged.       Continuing antibiotics            encouraged.    ----------------------------     ------------------------------     PCT level increase compared          PCT > 0.5 ng/mL         with peak PCT AND          PCT >= 0.5 ng/mL             Escalation of antibiotics                                          strongly encouraged.      Escalation of antibiotics        strongly encouraged.   Culture, blood (Routine X 2) w Reflex to ID Panel     Status: None (Preliminary result)   Collection Time: 04/13/15  9:11 PM  Result Value Ref Range   Specimen Description BLOOD LEFT HAND    Special Requests BOTTLES DRAWN AEROBIC AND ANAEROBIC 5CC    Culture NO GROWTH < 24 HOURS    Report Status PENDING   Culture, blood (Routine X 2) w Reflex to ID Panel     Status: None (Preliminary result)   Collection Time: 04/13/15  9:27 PM  Result Value Ref Range   Specimen Description BLOOD RIGHT WRIST    Special Requests IN PEDIATRIC BOTTLE 2CC    Culture NO GROWTH < 24 HOURS    Report Status PENDING   Glucose, capillary     Status: Abnormal   Collection Time: 04/13/15  9:40 PM  Result Value Ref Range   Glucose-Capillary 185 (H) 65 - 99 mg/dL  Lactic acid, plasma  Status: None   Collection Time: 04/13/15 10:43 PM  Result Value Ref Range   Lactic Acid, Venous 1.7 0.5 - 2.0 mmol/L  MRSA PCR Screening     Status: None   Collection Time: 04/13/15 11:18 PM  Result Value Ref Range   MRSA by PCR NEGATIVE NEGATIVE    Comment:        The GeneXpert MRSA Assay (FDA approved for NASAL specimens only), is one component of a comprehensive MRSA colonization surveillance program. It is not intended to diagnose MRSA infection nor to guide or monitor treatment for MRSA infections.   Urinalysis, Routine w reflex microscopic (not at [facility])     Status: Abnormal   Collection Time: 04/14/15 12:17 AM  Result Value Ref Range   Color, Urine YELLOW  YELLOW   APPearance CLEAR CLEAR   Specific Gravity, Urine 1.016 1.005 - 1.030   pH 6.5 5.0 - 8.0   Glucose, UA 100 (A) NEGATIVE mg/dL   Hgb urine dipstick TRACE (A) NEGATIVE   Bilirubin Urine NEGATIVE NEGATIVE   Ketones, ur NEGATIVE NEGATIVE mg/dL   Protein, ur 30 (A) NEGATIVE mg/dL   Nitrite NEGATIVE NEGATIVE   Leukocytes, UA NEGATIVE NEGATIVE  Creatinine, urine, random     Status: None   Collection Time: 04/14/15 12:17 AM  Result Value Ref Range   Creatinine, Urine 94.05 mg/dL  Urine microscopic-add on     Status: Abnormal   Collection Time: 04/14/15 12:17 AM  Result Value Ref Range   Squamous Epithelial / LPF 0-5 (A) NONE SEEN   WBC, UA NONE SEEN 0 - 5 WBC/hpf   RBC / HPF 0-5 0 - 5 RBC/hpf   Bacteria, UA RARE (A) NONE SEEN   Casts HYALINE CASTS (A) NEGATIVE  Urea nitrogen, urine     Status: None   Collection Time: 04/14/15 12:18 AM  Result Value Ref Range   Urea Nitrogen, Ur 586 Not Estab. mg/dL    Comment: (NOTE) Performed At: [facility] [place], [place] [id number] Lindon Romp MD [id number]   Glucose, capillary     Status: Abnormal   Collection Time: 04/14/15  6:31 AM  Result Value Ref Range   Glucose-Capillary 210 (H) 65 - 99 mg/dL  Cortisol-am, blood     Status: None   Collection Time: 04/14/15  6:52 AM  Result Value Ref Range   Cortisol - AM 15.0 6.7 - 22.6 ug/dL  Brain natriuretic peptide     Status: None   Collection Time: 04/14/15  6:52 AM  Result Value Ref Range   B Natriuretic Peptide 21.7 0.0 - 100.0 pg/mL  Lipid panel     Status: Abnormal   Collection Time: 04/14/15  6:52 AM  Result Value Ref Range   Cholesterol 151 0 - 200 mg/dL   Triglycerides 156 (H) <150 mg/dL   HDL 52 >40 mg/dL   Total CHOL/HDL Ratio 2.9 RATIO   VLDL 31 0 - 40 mg/dL   LDL Cholesterol 68 0 - 99 mg/dL    Comment:        Total Cholesterol/HDL:CHD Risk Coronary Heart Disease Risk Table                     Men   Women  1/2 Average Risk   3.4    3.3  Average Risk       5.0   4.4  2 X Average Risk   9.6   7.1  3 X Average Risk  23.4   11.0        Use the calculated Patient Ratio above and the CHD Risk Table to determine the patient's CHD Risk.        ATP III CLASSIFICATION (LDL):  <100     mg/dL   Optimal  100-129  mg/dL   Near or Above                    Optimal  130-159  mg/dL   Borderline  160-189  mg/dL   High  >190     mg/dL   Very High   Basic metabolic panel     Status: Abnormal   Collection Time: 04/14/15  6:52 AM  Result Value Ref Range   Sodium 138 135 - 145 mmol/L   Potassium 4.0 3.5 - 5.1 mmol/L   Chloride 102 101 - 111 mmol/L   CO2 26 22 - 32 mmol/L   Glucose, Bld 212 (H) 65 - 99 mg/dL   BUN 42 (H) 6 - 20 mg/dL   Creatinine, Ser 1.71 (H) 0.61 - 1.24 mg/dL   Calcium 9.1 8.9 - 10.3 mg/dL   GFR calc non Af Amer 37 (L) >60 mL/min   GFR calc Af Amer 43 (L) >60 mL/min    Comment: (NOTE) The eGFR has been calculated using the CKD EPI equation. This calculation has not been validated in all clinical situations. eGFR's persistently <60 mL/min signify possible Chronic Kidney Disease.    Anion gap 10 5 - 15  CBC     Status: Abnormal   Collection Time: 04/14/15  6:52 AM  Result Value Ref Range   WBC 7.9 4.0 - 10.5 K/uL   RBC 2.97 (L) 4.22 - 5.81 MIL/uL   Hemoglobin 9.4 (L) 13.0 - 17.0 g/dL   HCT 28.6 (L) 39.0 - 52.0 %   MCV 96.3 78.0 - 100.0 fL   MCH 31.6 26.0 - 34.0 pg   MCHC 32.9 30.0 - 36.0 g/dL   RDW 14.9 11.5 - 15.5 %   Platelets 215 150 - 400 K/uL  Glucose, capillary     Status: Abnormal   Collection Time: 04/14/15  9:14 AM  Result Value Ref Range   Glucose-Capillary 169 (H) 65 - 99 mg/dL  Body fluid culture     Status: None (Preliminary result)   Collection Time: 04/14/15 10:14 AM  Result Value Ref Range   Specimen Description FLUID RIGHT KNEE    Special Requests NONE    Gram Stain      MODERATE WBC PRESENT, PREDOMINANTLY PMN NO ORGANISMS SEEN    Culture NO GROWTH < 24 HOURS    Report  Status PENDING   Glucose, capillary     Status: Abnormal   Collection Time: 04/14/15 11:39 AM  Result Value Ref Range   Glucose-Capillary 187 (H) 65 - 99 mg/dL  Glucose, capillary     Status: Abnormal   Collection Time: 04/14/15  4:45 PM  Result Value Ref Range   Glucose-Capillary 242 (H) 65 - 99 mg/dL   Comment 1 Notify RN   Glucose, capillary     Status: Abnormal   Collection Time: 04/14/15  9:15 PM  Result Value Ref Range   Glucose-Capillary 254 (H) 65 - 99 mg/dL  Basic metabolic panel     Status: Abnormal   Collection Time: 04/15/15  5:40 AM  Result Value Ref Range   Sodium 138 135 - 145 mmol/L   Potassium 5.0 3.5 - 5.1 mmol/L  Comment: DELTA CHECK NOTED   Chloride 104 101 - 111 mmol/L   CO2 24 22 - 32 mmol/L   Glucose, Bld 237 (H) 65 - 99 mg/dL   BUN 33 (H) 6 - 20 mg/dL   Creatinine, Ser 1.70 (H) 0.61 - 1.24 mg/dL   Calcium 8.5 (L) 8.9 - 10.3 mg/dL   GFR calc non Af Amer 38 (L) >60 mL/min   GFR calc Af Amer 44 (L) >60 mL/min    Comment: (NOTE) The eGFR has been calculated using the CKD EPI equation. This calculation has not been validated in all clinical situations. eGFR's persistently <60 mL/min signify possible Chronic Kidney Disease.    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 Develops early PJI of right knee shortly after finishing oral abtx for aforementioned wound infection. Currently on vanco and piptazo.  - will plan to treat with 6 wk of IV abtx then oral abx for 4.5 months to  complete 6 month course of treatment for early PJI. 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 Vancomycin would also treat  Will arrange for follow up in the ID office and home health orders. Will need picc line.

## 2015-04-15 NOTE — Progress Notes (Signed)
OT Cancellation Note  Patient Details Name: EH WAVER MRN: [id number] DOB: Oct 02, 1940   Cancelled Treatment:    Reason Eval/Treat Not Completed: Pain limiting ability to participate. On OT arrival, pt stated he was not moving if it hurt his leg. Educated pt on importance of mobilizing after surgery for overall wellbeing and to avoid further complications. Pt became belligerent with OT and screamed "I'm not getting out of this bed until I'm juiced up!" Pt became increasing agitated and continued to yell at therapist. Will attempt to evaluate pt at a later time when schedule allows.  Redmond Baseman, OTR/L Pager: [phone] 04/15/2015, 3:25 PM

## 2015-04-15 NOTE — Care Management (Signed)
Utilization review completed. Keelia Graybill, RN Case Manager [phone]. 

## 2015-04-15 NOTE — Progress Notes (Signed)
PT Cancellation Note  Patient Details Name: MELBERN CEFALO MRN: [id number] DOB: 31-Aug-1940   Cancelled Treatment:    Reason Eval/Treat Not Completed: Pain limiting ability to participate.  Attempted evaluation.  Patient required increased encouragement to attempt PT.  Working with patient on scooting toward edge of bed.  Patient then reports pain too great and refused further treatment.  Patient stating "we are not doing this until I get enough pain medicine to knock me out!".  Discussed with patient and nursing that nursing had given morphine 20 minutes prior to PT session and that was the maximum pain medicine he could have. Patient continued to refused further PT.  Patient quite belligerent during session.     Shanna Cisco 04/15/2015, 12:40 PM

## 2015-04-15 NOTE — Progress Notes (Signed)
Peripherally Inserted Central Catheter/Midline Placement  The IV Nurse has discussed with the patient and/or persons authorized to consent for the patient, the purpose of this procedure and the potential benefits and risks involved with this procedure.  The benefits include less needle sticks, lab draws from the catheter and patient may be discharged home with the catheter.  Risks include, but not limited to, infection, bleeding, blood clot (thrombus formation), and puncture of an artery; nerve damage and irregular heat beat.  Alternatives to this procedure were also discussed.  PICC/Midline Placement Documentation  PICC Single Lumen [id number] PICC Right Basilic 40 cm 0 cm (Active)  Indication for Insertion or Continuance of Line Home intravenous therapies (PICC only) 04/15/2015  4:25 PM  Exposed Catheter (cm) 0 cm 04/15/2015  4:25 PM  Site Assessment Clean;Dry;Intact 04/15/2015  4:25 PM  Line Status Flushed;Saline locked;Blood return noted 04/15/2015  4:25 PM  Dressing Type Transparent 04/15/2015  4:25 PM  Dressing Status Clean;Dry;Intact;Antimicrobial disc in place 04/15/2015  4:25 PM  Line Care Connections checked and tightened 04/15/2015  4:25 PM  Line Adjustment (NICU/IV Team Only) No 04/15/2015  4:25 PM  Dressing Intervention New dressing 04/15/2015  4:25 PM  Dressing Change Due 04/22/15 04/15/2015  4:25 PM       Rolena Infante 04/15/2015, 4:26 PM

## 2015-04-16 LAB — GLUCOSE, CAPILLARY
GLUCOSE-CAPILLARY: 224 mg/dL — AB (ref 65–99)
Glucose-Capillary: 157 mg/dL — ABNORMAL HIGH (ref 65–99)
Glucose-Capillary: 285 mg/dL — ABNORMAL HIGH (ref 65–99)
Glucose-Capillary: 251 mg/dL — ABNORMAL HIGH (ref 65–99)

## 2015-04-16 LAB — CBC WITH DIFFERENTIAL/PLATELET
Basophils Absolute: 0 10*3/uL (ref 0.0–0.1)
Basophils Relative: 0 %
EOS ABS: 0.1 10*3/uL (ref 0.0–0.7)
EOS PCT: 1 %
HCT: 22.2 % — ABNORMAL LOW (ref 39.0–52.0)
Hemoglobin: 7.1 g/dL — ABNORMAL LOW (ref 13.0–17.0)
LYMPHS ABS: 1.5 10*3/uL (ref 0.7–4.0)
LYMPHS PCT: 21 %
MCH: 31.1 pg (ref 26.0–34.0)
MCHC: 32 g/dL (ref 30.0–36.0)
MCV: 97.4 fL (ref 78.0–100.0)
MONO ABS: 0.4 10*3/uL (ref 0.1–1.0)
Monocytes Relative: 5 %
Neutro Abs: 5.5 10*3/uL (ref 1.7–7.7)
Neutrophils Relative %: 73 %
PLATELETS: 213 10*3/uL (ref 150–400)
RBC: 2.28 MIL/uL — ABNORMAL LOW (ref 4.22–5.81)
RDW: 14.9 % (ref 11.5–15.5)
WBC: 7.5 10*3/uL (ref 4.0–10.5)

## 2015-04-16 LAB — PREPARE RBC (CROSSMATCH)

## 2015-04-16 LAB — C-REACTIVE PROTEIN: CRP: 12 mg/dL — ABNORMAL HIGH (ref <1.0)

## 2015-04-16 LAB — SEDIMENTATION RATE: SED RATE: 119 mm/h — AB (ref 0–16)

## 2015-04-16 MED ORDER — SODIUM CHLORIDE 0.9 % IV SOLN: Freq: Once | INTRAVENOUS | Status: DC

## 2015-04-16 MED ORDER — FLEET ENEMA 7-19 GM/118ML RE ENEM
1.0000 | ENEMA | Freq: Once | RECTAL | Status: AC
  Administered 2015-04-16: 1 via RECTAL
  Filled 2015-04-16: qty 1

## 2015-04-16 NOTE — Progress Notes (Signed)
Pt complained that he was could not find his phone , I and NT searched the room and did not find his phone. Recording to pt, it was in a red case. I called OR to see if pt left his phon down there. Sharil from OR clarified that there was no phone left from pt. I notified charge nurse and  I passed the information to night RN as well. Will continue to follow up and search around.

## 2015-04-16 NOTE — Evaluation (Signed)
Physical Therapy Evaluation Patient Details Name: Marc [name] MRN: [id number] DOB: Oct 06, 1940 Today's Date: 04/16/2015   History of Present Illness  Admitted for right knee washout and drain placement due to infection, s/p TKR (2012).  Patient also recently underwent femoral artery bypass and excisional debridement of right great toe amputation site, ray amputation of right second toe and placement of VAC on 03/21/15 by Dr. Scot Dock.   PMH significant for:  Diabetes, Hypertension, Renal Insufficiency Chronic and Gout and tobacco abuse disorder  Clinical Impression  Patient is s/p above surgery resulting in the deficits listed below (see PT Problem List). Patient will benefit from skilled PT to increase his independence and safety with mobility  to allow discharge to home (pt states he does not want SNF). Pt is very concerned about the posibility of an amputation.  He also states he will do things on his own timetable.      Follow Up Recommendations Home health PT;Supervision for mobility/OOB    Equipment Recommendations  None recommended by PT    Recommendations for Other Services       Precautions / Restrictions Precautions Precautions: Fall Required Braces or Orthoses: Knee Immobilizer - Right Knee Immobilizer - Right: On at all times (No CPM, do not remove for PT) Other Brace/Splint: Pt with Darco shoe for rt foot Restrictions Weight Bearing Restrictions: Yes RLE Weight Bearing: Weight bearing as tolerated      Mobility  Bed Mobility Overal bed mobility: Needs Assistance Bed Mobility: Supine to Sit     Supine to sit: Mod assist     General bed mobility comments: Needs someone to hold RLE throughout transition to sitting  Transfers Overall transfer level: Needs assistance Equipment used: Rolling walker (2 wheeled) Transfers: Sit to/from Stand Sit to Stand: Min assist         General transfer comment: Needed physical assist to get RLE out in front of him when  sitting  Ambulation/Gait Ambulation/Gait assistance: Min guard Ambulation Distance (Feet): 5 Feet Assistive device: Rolling walker (2 wheeled) Gait Pattern/deviations: Step-to pattern;Decreased stride length Gait velocity: greatly decreased Gait velocity interpretation: Below normal speed for age/gender General Gait Details: no balance losses with mobility  Stairs            Wheelchair Mobility    Modified Rankin (Stroke Patients Only)       Balance Overall balance assessment: Needs assistance             Standing balance comment: Relied heavily on RW with both UE's                             Pertinent Vitals/Pain Pain Assessment: 0-10 Pain Score: 4  Pain Location: R knee Pain Descriptors / Indicators: Burning;Sharp Pain Intervention(s): Limited activity within patient's tolerance;Premedicated before session;RN gave pain meds during session;Monitored during session    [place] expects to be discharged to:: Private residence Living Arrangements: Spouse/significant other Available Help at Discharge: Family Type of Home: Mobile home Home Access: Stairs to enter Entrance Stairs-Rails: Left Entrance Stairs-Number of Steps: [place]: [place] - 2 wheels;Cane - single point;Walker - 4 wheels;Shower seat;Adaptive equipment Additional Comments: 6 steps w/ Lt rail up onto deck, 2 steps up to mud room w/o rail, then 1 step up into home w/o rail.  3 steps down to get to wood stove w/ Rt rail going down.      Prior Function Level of Independence:  Independent with assistive device(s)         Comments: Using RW most of the time; Mod I for ADL's per pt report     Hand Dominance   Dominant Hand: Right    Extremity/Trunk Assessment   Upper Extremity Assessment: Defer to OT evaluation           Lower Extremity Assessment: RLE deficits/detail RLE Deficits / Details: Pt immobilized at knee.  Limited ROM/strength  throughout due to pain       Communication   Communication: No difficulties  Cognition Arousal/Alertness: Awake/alert Behavior During Therapy: WFL for tasks assessed/performed Overall Cognitive Status: Within Functional Limits for tasks assessed                      General Comments General comments (skin integrity, edema, etc.): Pt states he is going to do things on his schedule. Was agreeable to get up as he states he knows he has to prove he can walk before they will let him go home.  Pt with multiple complaints regarding his medical care over the last several months and feels he is in the condition he is in now due to poor medical care.     Exercises        Assessment/Plan    PT Assessment Patient needs continued PT services  PT Diagnosis Difficulty walking   PT Problem List Decreased strength;Decreased activity tolerance;Decreased balance;Decreased mobility;Pain  PT Treatment Interventions Gait training;Stair training;Therapeutic exercise;Patient/family education   PT Goals (Current goals can be found in the Care Plan section) Acute Rehab PT Goals Patient Stated Goal: To go home Time For Goal Achievement: 04/23/15 Potential to Achieve Goals: Good    Frequency Min 5X/week   Barriers to discharge        Co-evaluation               End of Session Equipment Utilized During Treatment: Gait belt;Right knee immobilizer (Darco shoe on right, tennis show on left) Activity Tolerance: Patient tolerated treatment well Patient left: in chair;with call bell/phone within reach;with chair alarm set Nurse Communication: Mobility status;Precautions (Need for KI at all times)         Time: 0916-1010 (PA in room for about 10 minutes and PT stepped out) PT Time Calculation (min) (ACUTE ONLY): 54 min   Charges:   PT Evaluation $PT Eval Moderate Complexity: 1 Procedure PT Treatments $Gait Training: 23-37 mins   PT G Codes:        Melvern Banker 04/16/2015,  10:17 AM  Lavonia Dana, PT  [phone] 04/16/2015

## 2015-04-16 NOTE — Progress Notes (Signed)
Pharmacy Antibiotic Note  Marc [name] is a 75 y.o. male admitted on 04/13/2015 with infected R total knee. Pharmacy has been consulted for Vancomycin and Zosyn dosing. WBC 10.6. Afebrile. CrCl ~ 30-35 mL/min   Plan: -Vancomycin 1250 mg IV q24h.  Goal trough 15-20 mcg/mL. -Zosyn 3.375 gm IV Q 8 hours -Monitor CBC, renal fx, cultures and clinical progress -was unable to get trough tonight as planned d/t patient receiving 2 units of blood and not having acceptable acces. Will follow for ability to order a trough tomorrow since he is to be on vancomycin long term   Weight: 193 lb 3.2 oz (87.635 kg)  Temp (24hrs), Avg:98 F (36.7 C), Min:97.5 F (36.4 C), Max:98.4 F (36.9 C)   Recent Labs Lab 04/13/15 1900 04/13/15 2243 04/14/15 0652 04/15/15 0540 04/16/15 0455  WBC 10.6*  --  7.9  --  7.5  CREATININE 2.15*  --  1.71* 1.70*  --   LATICACIDVEN  --  1.7  --   --   --     Estimated Creatinine Clearance: 42.4 mL/min (by C-G formula based on Cr of 1.7).    No Known Allergies  Antimicrobials this admission: Vanc 3/16 >> Zosyn 3/16 >>  Dose adjustments this admission: None   Microbiology results: 3/16 BCx2: ngtd 3/17 R knee fluid: ngtd 3/16 MRSA PCR: neg  Thank you for allowing pharmacy to be a part of this patient's care.  Erlinda Solinger D. Sahaana Weitman, PharmD, BCPS Clinical Pharmacist Pager: [phone] 04/16/2015 6:29 PM

## 2015-04-16 NOTE — Care Management Important Message (Signed)
Important Message  Patient Details  Name: Marc [name] MRN: [id number] Date of Birth: Jun 18, 1940   Medicare Important Message Given:  Yes    Apolonio Schneiders, RN 04/16/2015, 9:37 AM

## 2015-04-16 NOTE — Progress Notes (Signed)
OT Cancellation Note  Patient Details Name: MATVEY SANTILLANA MRN: [id number] DOB: 14-Apr-1940   Cancelled Treatment:    Reason Eval/Treat Not Completed: Fatigue/lethargy limiting ability to participate. Pt refused to participate in therapy today, but requested OT to come back tomorrow to go over ADLs. Pt just received pain medication and was unable to keep eyes open without verbal and tactile cues. Pt asked therapist, "Did you see that rat that just ran across here?" and upon leaving pt yelled "Do y'all have a lost and found department around here" with eyes closed. RN notified of pt's behaviors.   Redmond Baseman, OTR/L Pager: [phone] 04/16/2015, 2:14 PM

## 2015-04-16 NOTE — Care Management Note (Signed)
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 He is attempting to get up with PT. Reports he plans to discharge home with appropriate. CM will continue to follow. Unable to assess further at this time.   Expected Discharge Date:                  Expected Discharge Plan:  [place]  In-House Referral:     Discharge planning Services  CM Consult  Post Acute Care Choice:    Choice offered to:     DME Arranged:    DME Agency:     HH Arranged:    [place] Agency:     Status of Service:  In process, will continue to follow  Medicare Important Message Given:  Yes Date Medicare IM Given:    Medicare IM give by:    Date Additional Medicare IM Given:    Additional Medicare Important Message give by:     If discussed at [place] of Stay Meetings, dates discussed:    Additional Comments:  Apolonio Schneiders, RN 04/16/2015, 9:57 AM

## 2015-04-16 NOTE — Progress Notes (Signed)
Pt was on [facility] and complained that he could not have a bowel movement. He request an edema. I notified th e on call MD to request an order. Report gave to Night RN about pt's request.

## 2015-04-16 NOTE — Progress Notes (Signed)
PATIENT ID: Marc [name]  MRN: [id number]  DOB/AGE:  76-Aug-1942 / 76 y.o.  2 Days Post-Op Procedure(s) (LRB): RIGHT KNEE WASH OUT AND PLACEMENT OF DRAINS (Right) IRRIGATION AND DEBRIDEMENT KNEE (Right)    PROGRESS NOTE Subjective: Patient is alert, oriented, no Nausea, no Vomiting, yes passing gas. Taking PO well. Denies SOB, Chest or Calf Pain. Using Incentive Spirometer, PAS in place. Ambulate WBAT with knee immobilizer,  Patient reports pain as severe, but better than yesterday.    Objective: Vital signs in last 24 hours: Filed Vitals:   04/15/15 0218 04/15/15 0546 04/15/15 2033 04/16/15 0539  BP: 120/71 125/74 105/66 136/73  Pulse: 88 81 66 65  Temp: 99.1 F (37.3 C) 98.4 F (36.9 C) 98.4 F (36.9 C) 97.5 F (36.4 C)  TempSrc: Oral Oral Oral Oral  Resp: 18 18 18 18   Weight:  83.779 kg (184 lb 11.2 oz)  87.635 kg (193 lb 3.2 oz)  SpO2: 99% 100% 100% 100%      Intake/Output from previous day: I/O last 3 completed shifts: In: 1513.3 [P.O.:720; I.V.:793.3] Out: 2910 [Urine:2250; Drains:660]   Intake/Output this shift:     LABORATORY DATA:  Recent Labs  04/13/15 1900  04/14/15 0652  04/15/15 0540  04/15/15 1715 04/15/15 2106 04/16/15 0455 04/16/15 0640  WBC 10.6*  --  7.9  --   --   --   --   --  7.5  --   HGB 11.2*  --  9.4*  --   --   --   --   --  7.1*  --   HCT 35.0*  --  28.6*  --   --   --   --   --  22.2*  --   PLT 277  --  215  --   --   --   --   --  213  --   NA 137  --  138  --  138  --   --   --   --   --   K 4.3  --  4.0  --  5.0  --   --   --   --   --   CL 95*  --  102  --  104  --   --   --   --   --   CO2 28  --  26  --  24  --   --   --   --   --   BUN 47*  --  42*  --  33*  --   --   --   --   --   CREATININE 2.15*  --  1.71*  --  1.70*  --   --   --   --   --   GLUCOSE 276*  --  212*  --  237*  --   --   --   --   --   GLUCAP  --   < >  --   < >  --   < > 402* 349*  --  224*  INR 1.04  --   --   --   --   --   --   --   --   --    CALCIUM 9.8  --  9.1  --  8.5*  --   --   --   --   --   < > = values in this interval not  displayed.  Examination: Neurologically intact Neurovascular intact Dorsiflexion/Plantar flexion intact Incision: moderate drainage and Dressing was removed and there was no active drainage.  Rederessed with 4x4's and ace bandage. No cellulitis present Compartment soft} Pt does feel fatigued.  Assessment:   2 Days Post-Op Procedure(s) (LRB): RIGHT KNEE [place] OUT AND PLACEMENT OF DRAINS (Right) IRRIGATION AND DEBRIDEMENT KNEE (Right) ADDITIONAL DIAGNOSIS: Expected Acute Blood Loss Anemia,  Hypertension, Renal Insufficiency Chronic and Gout and tobacco abuse disorder  Plan: PT/OT WBAT with knee immobilizer DVT Prophylaxis:  SCDx72hrs, ASA 325 mg BID x 2 weeks DISCHARGE PLAN: Home DISCHARGE NEEDS: HHPT, HHRN, Walker, 3-in-1 comode seat and IV Antibiotics  Plan to transfuse 2 units as his Hgb was 7.1.  He also mentions that at his last stay he needed a transfusion.     Rahim Astorga R 04/16/2015, 10:04 AM

## 2015-04-16 NOTE — Progress Notes (Signed)
Marc [name] [id number] DOB: 1940-08-15 DOA: 04/13/2015 PCP: [name], MD  Brief narrative: 75 y/o ? 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 Lung nodule Chr Diastolic HF last EF Asc Aortic aneursym  Admitted with infection and was taken to OR for removal of infected hardware Transfused 3.18 Cut elbow 3.18  Past medical history-As per Problem list Chart reviewed as below- Reviewed as above  Consultants:  ORtho Rowan  Procedures:   Arthoscopic wash-out of R knee 3/17  Antibiotics:  Vancomycin 3/16  ZOsyn 3/16   Subjective   Pain much better Cut R elbow yesterday and still oozing No cp no n/v NOw stating interested in SNF   Objective    Interim History:   Telemetry: nad   Objective: Filed Vitals:   04/15/15 2033 04/16/15 0539 04/16/15 1525 04/16/15 1550  BP: 105/66 136/73 143/68 154/71  Pulse: 66 65 80 97  Temp: 98.4 F (36.9 C) 97.5 F (36.4 C) 97.7 F (36.5 C) 98.2 F (36.8 C)  TempSrc: Oral Oral Oral Oral  Resp: 18 18 14    Weight:  87.635 kg (193 lb 3.2 oz)    SpO2: 100% 100% 96%     Intake/Output Summary (Last 24 hours) at 04/16/15 1711 Last data filed at 04/16/15 1525  Gross per 24 hour  Intake     30 ml  Output   1030 ml  Net  -1000 ml    Exam:  General: alert flat  Cardiovascular: s1 s2 no m/r/g Respiratory: clear no added sound Skin tear to R elbow---oozing Abdomen: soft nt nd no rebound Skin wound covered Neuro intact  Data Reviewed: Basic Metabolic Panel:  Recent Labs Lab 04/13/15 1900 04/14/15 0652 04/15/15 0540  NA 137 138 138  K 4.3 4.0 5.0  CL 95*  102 104  CO2 28 26 24   GLUCOSE 276* 212* 237*  BUN 47* 42* 33*  CREATININE 2.15* 1.71* 1.70*  CALCIUM 9.8 9.1 8.5*   Liver Function Tests: No results for input(s): AST, ALT, ALKPHOS, BILITOT, PROT, ALBUMIN in the last 168 hours. No results for input(s): LIPASE, AMYLASE in the last 168 hours. No results for input(s): AMMONIA in the last 168 hours. CBC:  Recent Labs Lab 04/13/15 1900 04/14/15 0652 04/16/15 0455  WBC 10.6* 7.9 7.5  NEUTROABS 7.7  --  5.5  HGB 11.2* 9.4* 7.1*  HCT 35.0* 28.6* 22.2*  MCV 96.7 96.3 97.4  PLT 277 215 213   Cardiac Enzymes: No results for input(s): CKTOTAL, CKMB, CKMBINDEX, TROPONINI in the last 168 hours. BNP: Invalid input(s): POCBNP CBG:  Recent Labs Lab 04/15/15 1715 04/15/15 2106 04/16/15 0640 04/16/15 1154 04/16/15 1641  GLUCAP 402* 349* 224* 157* 285*    Recent Results (from the past 240 hour(s))  Culture, blood (Routine X 2) w Reflex to ID Panel     Status: None (Preliminary result)   Collection Time: 04/13/15  9:11 PM  Result Value Ref Range Status   Specimen Description BLOOD LEFT HAND  Final   Special Requests BOTTLES DRAWN AEROBIC AND ANAEROBIC 5CC  Final   Culture NO GROWTH 3 DAYS  Final   Report Status  PENDING  Incomplete  Culture, blood (Routine X 2) w Reflex to ID Panel     Status: None (Preliminary result)   Collection Time: 04/13/15  9:27 PM  Result Value Ref Range Status   Specimen Description BLOOD RIGHT WRIST  Final   Special Requests IN PEDIATRIC BOTTLE 2CC  Final   Culture NO GROWTH 3 DAYS  Final   Report Status PENDING  Incomplete  MRSA PCR Screening     Status: None   Collection Time: 04/13/15 11:18 PM  Result Value Ref Range Status   MRSA by PCR NEGATIVE NEGATIVE Final    Comment:        The GeneXpert MRSA Assay (FDA approved for NASAL specimens only), is one component of a comprehensive MRSA colonization surveillance program. It is not intended to diagnose MRSA infection nor to guide or monitor  treatment for MRSA infections.   Body fluid culture     Status: None (Preliminary result)   Collection Time: 04/14/15 10:14 AM  Result Value Ref Range Status   Specimen Description FLUID RIGHT KNEE  Final   Special Requests NONE  Final   Gram Stain   Final    MODERATE WBC PRESENT, PREDOMINANTLY PMN NO ORGANISMS SEEN    Culture NO GROWTH 2 DAYS  Final   Report Status PENDING  Incomplete     Studies:              All Imaging reviewed and is as per above notation   Scheduled Meds: . sodium chloride   Intravenous Once  . amLODipine  5 mg Oral Daily  . aspirin EC  81 mg Oral QHS  . atorvastatin  10 mg Oral QHS  . clopidogrel  75 mg Oral QHS  . colchicine  0.6 mg Oral QHS  . collagenase  1 application Topical QODAY  . gabapentin  1,200 mg Oral BID  . insulin aspart  0-15 Units Subcutaneous TID WC  . insulin aspart  3 Units Subcutaneous TID WC  . insulin detemir  7 Units Subcutaneous QHS  . nicotine  21 mg Transdermal QHS  . oxyCODONE-acetaminophen  2 tablet Oral Q4H  . pantoprazole  40 mg Oral Daily  . piperacillin-tazobactam (ZOSYN)  IV  3.375 g Intravenous Q8H  . polyethylene glycol  17 g Oral Daily  . predniSONE  20 mg Oral Q breakfast  . senna-docusate  1 tablet Oral QHS  . vancomycin  1,250 mg Intravenous Q24H   Continuous Infusions: . lactated ringers 20 mL/hr at 04/15/15 1829     Assessment/Plan:  1. Complicated RLE wound in setting prior Fem-pops/p Wash-out-Continue IV Vanc and zosyn -Appreicate input from ID Dr. Baxter Flattery.  PICC order placed-follow up Cultures from wash-out 3/17 to narrow--NGTD so far from Blood cult x 2 3.16, INtra-op cultures 3.17--Per ID + Ortho-Started Dilaudid 1-2 q3prn severe breakthrough pain--Changed PRN to scheduled q 4 hourly percocet 3.18.17.  saline lock iv.  Cont asa 81, plavix 75.  Ambulated to chair today and pain under better control 3.19--?Amputation?? Ortho to address. 2. R elbow skin tear-Wrapped with pressure dressing.  Nursing to  assess this pm  3. ABLA-secondary to Anemia of blood loss as well as Skin tear R elbow.  Is on ASa and Plavix.  Transfused 3.19.17 2 U Prbc- R leg not swollen nor painful and drainage from R leg in Hemovac marginal  4. DM tyII-last A1c 7.6 on 03/19/15 cbg 157-->285 .  Patient non-compliant on diet.  started lantus 7U on 3.18.  5. Spinal stenosis-on chr prednisone-Stenosis-need to taper to see if can promote healing moving forward as per PCP 6. 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 Acute kidney injury-monitor Bmet in am.  On admit Creat 2.15--->1.71 on 3/17.  PTA lasix 80 bid, Lisinopril 20 mg held--stable and improving 8. Gout-continue colchicine 0.6 daily, allopurinol 300 twice a da-stable 9. History of hypokalemia probably secondary to Lasix-potassium on hold, d/c D5 with K 10. Hyperlipidemia-continue 10 mg daily at bedtime defer to PCP regarding dosage change given significant ASCVD   No family present Inpatient pending resolution and ID work-up Monitor on MedSurg    Verneita Griffes, MD  [facility] Pager [phone] 04/16/2015, 5:11 PM    LOS: 3 days

## 2015-04-17 ENCOUNTER — Encounter (HOSPITAL_COMMUNITY): Payer: Self-pay | Admitting: Orthopedic Surgery

## 2015-04-17 DIAGNOSIS — B9689 Other specified bacterial agents as the cause of diseases classified elsewhere: Secondary | ICD-10-CM

## 2015-04-17 DIAGNOSIS — I712 Thoracic aortic aneurysm, without rupture: Secondary | ICD-10-CM

## 2015-04-17 DIAGNOSIS — I2510 Atherosclerotic heart disease of native coronary artery without angina pectoris: Secondary | ICD-10-CM

## 2015-04-17 DIAGNOSIS — S91309A Unspecified open wound, unspecified foot, initial encounter: Secondary | ICD-10-CM

## 2015-04-17 DIAGNOSIS — X58XXXA Exposure to other specified factors, initial encounter: Secondary | ICD-10-CM

## 2015-04-17 DIAGNOSIS — T8453XD Infection and inflammatory reaction due to internal right knee prosthesis, subsequent encounter: Secondary | ICD-10-CM

## 2015-04-17 DIAGNOSIS — T8450XD Infection and inflammatory reaction due to unspecified internal joint prosthesis, subsequent encounter: Secondary | ICD-10-CM

## 2015-04-17 DIAGNOSIS — M00861 Arthritis due to other bacteria, right knee: Secondary | ICD-10-CM

## 2015-04-17 DIAGNOSIS — F17200 Nicotine dependence, unspecified, uncomplicated: Secondary | ICD-10-CM

## 2015-04-17 DIAGNOSIS — Y838 Other surgical procedures as the cause of abnormal reaction of the patient, or of later complication, without mention of misadventure at the time of the procedure: Secondary | ICD-10-CM

## 2015-04-17 LAB — CBC
HEMATOCRIT: 24.6 % — AB (ref 39.0–52.0)
Hemoglobin: 7.9 g/dL — ABNORMAL LOW (ref 13.0–17.0)
MCH: 30 pg (ref 26.0–34.0)
MCHC: 32.1 g/dL (ref 30.0–36.0)
MCV: 93.5 fL (ref 78.0–100.0)
Platelets: 212 10*3/uL (ref 150–400)
RBC: 2.63 MIL/uL — ABNORMAL LOW (ref 4.22–5.81)
RDW: 17.7 % — AB (ref 11.5–15.5)
WBC: 7.5 10*3/uL (ref 4.0–10.5)

## 2015-04-17 LAB — COMPREHENSIVE METABOLIC PANEL
ALBUMIN: 2.3 g/dL — AB (ref 3.5–5.0)
ALK PHOS: 48 U/L (ref 38–126)
ALT: 7 U/L — AB (ref 17–63)
AST: 10 U/L — ABNORMAL LOW (ref 15–41)
Anion gap: 9 (ref 5–15)
BILIRUBIN TOTAL: 0.4 mg/dL (ref 0.3–1.2)
BUN: 32 mg/dL — ABNORMAL HIGH (ref 6–20)
CALCIUM: 8.9 mg/dL (ref 8.9–10.3)
CO2: 28 mmol/L (ref 22–32)
CREATININE: 1.63 mg/dL — AB (ref 0.61–1.24)
Chloride: 104 mmol/L (ref 101–111)
GFR calc non Af Amer: 40 mL/min — ABNORMAL LOW (ref >60)
GFR, EST AFRICAN AMERICAN: 46 mL/min — AB (ref >60)
GLUCOSE: 213 mg/dL — AB (ref 65–99)
Potassium: 4.6 mmol/L (ref 3.5–5.1)
SODIUM: 141 mmol/L (ref 135–145)
TOTAL PROTEIN: 5.5 g/dL — AB (ref 6.5–8.1)

## 2015-04-17 LAB — CBC WITH DIFFERENTIAL/PLATELET
Basophils Absolute: 0 10*3/uL (ref 0.0–0.1)
Basophils Relative: 0 %
EOS ABS: 0.1 10*3/uL (ref 0.0–0.7)
Eosinophils Relative: 1 %
HEMATOCRIT: 22.3 % — AB (ref 39.0–52.0)
HEMOGLOBIN: 7.1 g/dL — AB (ref 13.0–17.0)
LYMPHS ABS: 2 10*3/uL (ref 0.7–4.0)
Lymphocytes Relative: 24 %
MCH: 30.2 pg (ref 26.0–34.0)
MCHC: 31.8 g/dL (ref 30.0–36.0)
MCV: 94.9 fL (ref 78.0–100.0)
MONOS PCT: 3 %
Monocytes Absolute: 0.3 10*3/uL (ref 0.1–1.0)
NEUTROS ABS: 5.9 10*3/uL (ref 1.7–7.7)
NEUTROS PCT: 72 %
Platelets: 203 10*3/uL (ref 150–400)
RBC: 2.35 MIL/uL — AB (ref 4.22–5.81)
RDW: 16.3 % — ABNORMAL HIGH (ref 11.5–15.5)
WBC: 8.2 10*3/uL (ref 4.0–10.5)

## 2015-04-17 LAB — TYPE AND SCREEN
ABO/RH(D): O NEG
ANTIBODY SCREEN: NEGATIVE
UNIT DIVISION: 0
Unit division: 0

## 2015-04-17 LAB — PREPARE RBC (CROSSMATCH)

## 2015-04-17 LAB — GLUCOSE, CAPILLARY
GLUCOSE-CAPILLARY: 191 mg/dL — AB (ref 65–99)
Glucose-Capillary: 152 mg/dL — ABNORMAL HIGH (ref 65–99)
Glucose-Capillary: 309 mg/dL — ABNORMAL HIGH (ref 65–99)

## 2015-04-17 LAB — VANCOMYCIN, TROUGH: Vancomycin Tr: 17 ug/mL (ref 10.0–20.0)

## 2015-04-17 MED ORDER — TIZANIDINE HCL 2 MG PO CAPS: 2.0000 mg | ORAL_CAPSULE | Freq: Three times a day (TID) | ORAL | Status: DC

## 2015-04-17 MED ORDER — SODIUM CHLORIDE 0.9 % IV SOLN: Freq: Once | INTRAVENOUS | Status: DC

## 2015-04-17 MED ORDER — ASPIRIN EC 325 MG PO TBEC
325.0000 mg | DELAYED_RELEASE_TABLET | Freq: Two times a day (BID) | ORAL | Status: DC
  Administered 2015-04-17 – 2015-04-18 (×3): 325 mg via ORAL
  Filled 2015-04-17 (×3): qty 1

## 2015-04-17 MED ORDER — ASPIRIN EC 325 MG PO TBEC: 325.0000 mg | DELAYED_RELEASE_TABLET | Freq: Two times a day (BID) | ORAL | Status: DC

## 2015-04-17 MED ORDER — OXYCODONE-ACETAMINOPHEN 5-325 MG PO TABS: 1.0000 | ORAL_TABLET | ORAL | Status: DC | PRN

## 2015-04-17 MED ORDER — SODIUM CHLORIDE 0.9 % IV SOLN
Freq: Once | INTRAVENOUS | Status: AC
  Administered 2015-04-17: 10 mL/h via INTRAVENOUS

## 2015-04-17 MED ORDER — CEFTRIAXONE SODIUM 2 G IJ SOLR
2.0000 g | INTRAMUSCULAR | Status: DC
  Administered 2015-04-17 – 2015-04-18 (×2): 2 g via INTRAVENOUS
  Filled 2015-04-17 (×2): qty 2

## 2015-04-17 MED ORDER — AMLODIPINE BESYLATE 10 MG PO TABS
10.0000 mg | ORAL_TABLET | Freq: Every day | ORAL | Status: DC
  Administered 2015-04-17 – 2015-04-18 (×2): 10 mg via ORAL
  Filled 2015-04-17 (×2): qty 1

## 2015-04-17 NOTE — Progress Notes (Addendum)
PATIENT ID: Marc [name]  MRN: [id number]  DOB/AGE:  [birth year] / 75 y.o.  3 Days Post-Op Procedure(s) (LRB): RIGHT KNEE WASH OUT AND PLACEMENT OF DRAINS (Right) IRRIGATION AND DEBRIDEMENT KNEE (Right)    PROGRESS NOTE Subjective: Patient is alert, oriented, no Nausea, no Vomiting, yes passing gas. Taking PO well. Denies SOB, Chest or Calf Pain. Using Incentive Spirometer, PAS in place. Ambulate WBAT with pt walking 5 ft with therapy,  Patient reports pain as moderate.  Objective: Vital signs in last 24 hours: Filed Vitals:   04/16/15 2042 04/17/15 0255 04/17/15 0330 04/17/15 0617  BP: 145/72 161/67 139/59 170/67  Pulse: 58 57 86 71  Temp: 97.5 F (36.4 C) 98.6 F (37 C) 97.9 F (36.6 C) 97.7 F (36.5 C)  TempSrc: Oral Oral Oral Oral  Resp: 16 16 16 16   Weight:      SpO2: 99% 96% 98% 99%      Intake/Output from previous day: I/O last 3 completed shifts: In: 1060 [P.O.:720; Blood:340] Out: 2280 [Urine:2250; Drains:30]   Intake/Output this shift:     LABORATORY DATA:  Recent Labs  04/15/15 0540  04/16/15 0455  04/16/15 1641 04/16/15 2109 04/17/15 0140 04/17/15 0614  WBC  --   --  7.5  --   --   --  8.2  --   HGB  --   --  7.1*  --   --   --  7.1*  --   HCT  --   --  22.2*  --   --   --  22.3*  --   PLT  --   --  213  --   --   --  203  --   NA 138  --   --   --   --   --  141  --   K 5.0  --   --   --   --   --  4.6  --   CL 104  --   --   --   --   --  104  --   CO2 24  --   --   --   --   --  28  --   BUN 33*  --   --   --   --   --  32*  --   CREATININE 1.70*  --   --   --   --   --  1.63*  --   GLUCOSE 237*  --   --   --   --   --  213*  --   GLUCAP  --   < >  --   < > 285* 251*  --  152*  CALCIUM 8.5*  --   --   --   --   --  8.9  --   < > = values in this interval not displayed.  Examination: Neurologically intact Neurovascular intact Intact pulses distally Dorsiflexion/Plantar flexion intact Incision: dressing C/D/I Compartment soft} Blood  and plasma separated in drain indicating minimal recent drainage, drain pulled without difficulty.  Assessment:   3 Days Post-Op Procedure(s) (LRB): RIGHT KNEE [place] OUT AND PLACEMENT OF DRAINS (Right) IRRIGATION AND DEBRIDEMENT KNEE (Right) ADDITIONAL DIAGNOSIS: Expected Acute Blood Loss Anemia, Hypertension, Renal Insufficiency Chronic and Gout and tobacco abuse disorder  Plan: PT/OT WBAT, no CPM DVT Prophylaxis:  SCDx72hrs, ASA 325 mg BID x 2 weeks DISCHARGE PLAN: Home, thinking about SNF DISCHARGE NEEDS: HHPT, HHRN,  Walker, 3-in-1 comode seat and IV Antibiotics     PHILLIPS, ERIC R 04/17/2015, 7:36 AM

## 2015-04-17 NOTE — Care Management Note (Signed)
Case Management Note  Patient Details  Name: SALBADOR SHEHAN MRN: [id number] Date of Birth: [birth year]  Subjective/Objective:   75 yr old male s/p right knee washout, I & D.               Action/Plan:  Case manager had spoken with patient on 04/15/15 concerning home health and DME needs. Patient states he is active with [facility] and will continue to work with them. Patient states he has rolling walker and 3in1 at home already, his ex-wife will assist him at discharge. Case manager contacted Pietro Cassis, [facility] to confirm that patient is on their list for [place].   Expected Discharge Date:    04/18/15              Expected Discharge Plan: resumption of care with [facility]  In-House Referral:     Discharge planning Services  CM Consult  Post Acute Care Choice:    Choice offered to:     DME Arranged:   NA DME Agency:     HH Arranged:   RN PT , IV antibiotics HH Agency:   [facility]  Status of Service:  Completed Medicare Important Message Given:  Yes Date Medicare IM Given:    Medicare IM give by:    Date Additional Medicare IM Given:    Additional Medicare Important Message give by:     If discussed at [place] of Stay Meetings, dates discussed:    Additional Comments:  Ninfa Meeker, RN 04/17/2015, 9:51 AM

## 2015-04-17 NOTE — Progress Notes (Signed)
Marc [name] [id number] DOB: [birth year] DOA: 04/13/2015 PCP: [name], MD  Brief narrative: 75 y/o ? 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 LLL Lung nodule Chr Diastolic HF last EF Asc Aortic aneursym  Admitted with infection and was taken to OR for removal of infected hardware Transfused 3.18 Cut elbow 3.18  Past medical history-As per Problem list Chart reviewed as below- Reviewed as above  Consultants:  ORtho [name]  Procedures:   Arthoscopic wash-out of R knee 3/17  PICC line placed this admission  Antibiotics:  Vancomycin 3/16  ZOsyn 3/16   Subjective  Feels fair Doing better overall in terms of pain and requesting to see a vascular surgeon Wonders whether he will have amputation Eating and drinking better Pain is about 4 or 5 out of 10 and reasonably controlled Passing stool    Objective    Interim History:   Telemetry: nad   Objective: Filed Vitals:   04/17/15 0255 04/17/15 0330 04/17/15 0617 04/17/15 1551  BP: 161/67 139/59 170/67 149/81  Pulse: 57 86 71 56  Temp: 98.6 F (37 C) 97.9 F (36.6 C) 97.7 F (36.5 C) 98.2 F (36.8 C)  TempSrc: Oral Oral Oral   Resp: 16 16 16 18   Weight:      SpO2: 96% 98% 99% 98%    Intake/Output Summary (Last 24 hours) at 04/17/15 1806 Last data filed at 04/17/15 0800  Gross per 24 hour  Intake    605 ml  Output   1250 ml  Net   -645 ml    Exam:  General: alert Cardiovascular: s1 s2 no m/r/g Respiratory: clear no added sound Skin tear to R elbow---oozing Has slowed down from right elbow Abdomen: soft nt nd no rebound Skin  wound covered Neuro intact  Data Reviewed: Basic Metabolic Panel:  Recent Labs Lab 04/13/15 1900 04/14/15 0652 04/15/15 0540 04/17/15 0140  NA 137 138 138 141  K 4.3 4.0 5.0 4.6  CL 95* 102 104 104  CO2 28 26 24 28   GLUCOSE 276* 212* 237* 213*  BUN 47* 42* 33* 32*  CREATININE 2.15* 1.71* 1.70* 1.63*  CALCIUM 9.8 9.1 8.5* 8.9   Liver Function Tests:  Recent Labs Lab 04/17/15 0140  AST 10*  ALT 7*  ALKPHOS 48  BILITOT 0.4  PROT 5.5*  ALBUMIN 2.3*   No results for input(s): LIPASE, AMYLASE in the last 168 hours. No results for input(s): AMMONIA in the last 168 hours. CBC:  Recent Labs Lab 04/13/15 1900 04/14/15 0652 04/16/15 0455 04/17/15 0140 04/17/15 1615  WBC 10.6* 7.9 7.5 8.2 7.5  NEUTROABS 7.7  --  5.5 5.9  --   HGB 11.2* 9.4* 7.1* 7.1* 7.9*  HCT 35.0* 28.6* 22.2* 22.3* 24.6*  MCV 96.7 96.3 97.4 94.9 93.5  PLT 277 215 213 203 212   Cardiac Enzymes: No results for input(s): CKTOTAL, CKMB, CKMBINDEX, TROPONINI in the last 168 hours. BNP: Invalid input(s): POCBNP CBG:  Recent Labs Lab 04/16/15 1641 04/16/15 2109 04/17/15 0614 04/17/15 1133 04/17/15 1640  GLUCAP 285* 251* 152* 191* 309*    Recent Results (from the past 240 hour(s))  Culture, blood (Routine X 2) w Reflex to ID Panel     Status: None (Preliminary result)   Collection Time: 04/13/15  9:11 PM  Result Value Ref Range Status   Specimen Description BLOOD LEFT HAND  Final   Special Requests BOTTLES DRAWN AEROBIC AND ANAEROBIC 5CC  Final   Culture NO GROWTH 4 DAYS  Final   Report Status PENDING  Incomplete  Culture, blood (Routine X 2) w Reflex to ID Panel     Status: None (Preliminary result)   Collection Time: 04/13/15  9:27 PM  Result Value Ref Range Status   Specimen Description BLOOD RIGHT WRIST  Final   Special Requests IN PEDIATRIC BOTTLE 2CC  Final   Culture NO GROWTH 4 DAYS  Final   Report Status PENDING  Incomplete  MRSA PCR Screening     Status: None   Collection  Time: 04/13/15 11:18 PM  Result Value Ref Range Status   MRSA by PCR NEGATIVE NEGATIVE Final    Comment:        The GeneXpert MRSA Assay (FDA approved for NASAL specimens only), is one component of a comprehensive MRSA colonization surveillance program. It is not intended to diagnose MRSA infection nor to guide or monitor treatment for MRSA infections.   Body fluid culture     Status: None (Preliminary result)   Collection Time: 04/14/15 10:14 AM  Result Value Ref Range Status   Specimen Description FLUID RIGHT KNEE  Final   Special Requests NONE  Final   Gram Stain   Final    MODERATE WBC PRESENT, PREDOMINANTLY PMN NO ORGANISMS SEEN    Culture NO GROWTH 3 DAYS  Final   Report Status PENDING  Incomplete     Studies:              All Imaging reviewed and is as per above notation   Scheduled Meds: . sodium chloride   Intravenous Once  . sodium chloride   Intravenous Once  . amLODipine  5 mg Oral Daily  . aspirin EC  325 mg Oral BID  . atorvastatin  10 mg Oral QHS  . cefTRIAXone (ROCEPHIN)  IV  2 g Intravenous Q24H  . clopidogrel  75 mg Oral QHS  . colchicine  0.6 mg Oral QHS  . collagenase  1 application Topical QODAY  . gabapentin  1,200 mg Oral BID  . insulin aspart  0-15 Units Subcutaneous TID WC  . insulin aspart  3 Units Subcutaneous TID WC  . insulin detemir  7 Units Subcutaneous QHS  . nicotine  21 mg Transdermal QHS  . oxyCODONE-acetaminophen  2 tablet Oral Q4H  . pantoprazole  40 mg Oral Daily  . polyethylene glycol  17 g Oral Daily  . predniSONE  20 mg Oral Q breakfast  . senna-docusate  1 tablet Oral QHS  . vancomycin  1,250 mg Intravenous Q24H   Continuous Infusions: . lactated ringers 20 mL/hr at 04/15/15 1829     Assessment/Plan:  1. Complicated RLE wound in setting prior Fem-pops/p Wash-out-Continue IV Vanc and zosyn -Appreicate input from ID Dr. Baxter Flattery.  PICC order placed-follow up Cultures from wash-out 3/17 to narrow--NGTD so far from Blood  cult x 2 3.16, INtra-op cultures 3.17--Per ID can discharge on vancomycin and Zosyn for 6 weeks via right upper extremity PICC line. Discontinued IV pain meds 3/20 discharge home on scheduled percocet . saline lock iv.    Ambulating somewhat--?Amputation eventually--will discuss with Dr. Recruitment consultant in a.m. further plan  agree amputation 2. R elbow skin tear-Wrapped with pressure dressing.  Monitor while on high-dose aspirin 3. Hyperkalemia-resolving-currently potassium 4.6 4. ABLA-secondary to Anemia of blood loss as well as Skin tear R elbow.  Is on ASa and Plavix at home. Seems like he would benefit from aspirin 325 twice a day for 2 weeks for DVT prophylaxis however may also benefit from Plavix with regards to prior complicated surgery from femoropopliteal  bypass-Will discuss with vascular surgery overall plan of care a.m. 3/21-he is at high risk for bleeding on high-dose aspirin and Plavix. Transfused 3.19.17 2 U Prbc, transfused 1 unit PRBC 3/20- R leg not swollen nor painful and drainage from R leg in Hemovac marginal  5. DM tyII-last A1c 7.6 on 03/19/15 cbg 152-309 .  Patient non-compliant on diet.  Increased lantus 7U--12 units on 3.20.   6. Spinal stenosis-on chr prednisone-Stenosis. No tapering required 7. 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 Acute kidney injury-monitor Bmet in am.  On admit Creat 2.15--->1.71 on 3/17.  PTA lasix 80 bid, Lisinopril 20 mg held--stable and improving creatinine 1.7-1.3 currently.   9. Gout-continue colchicine 0.6 daily, allopurinol 300 twice a da-stable 10. Hypertension-poorly controlled, continue higher dose of amlodipine at 10 mg dose 11. History of hypokalemia probably secondary to Lasix-potassium on hold, d/c D5 with K 12. Hyperlipidemia-continue 10 mg daily at bedtime defer to PCP regarding dosage change given significant ASCVD   No family present Will discuss with vascular surgeon  tomorrow overall plan and disposition Hesitant to place on aspirin 650 for DVT prophylaxis given her drop in hemoglobin and need to determine if Plavix is necessary in a setting of femoropopliteal bypass and continued smoking-very high risk for right lower extremity BKA  Potential discharge a.m. 3/21 after discussion with vascular regarding plans   Verneita Griffes, MD  [facility] Pager [phone] 04/17/2015, 6:06 PM    LOS: 4 days

## 2015-04-17 NOTE — Evaluation (Signed)
Occupational Therapy Evaluation Patient Details Name: Marc [name] MRN: [id number] DOB: Apr 18, 1940 Today's Date: 04/17/2015    History of Present Illness Admitted for right knee washout and drain placement due to infection, s/p TKR (2012).  Patient also recently underwent femoral artery bypass and excisional debridement of right great toe amputation site, ray amputation of right second toe and placement of VAC on 03/21/15 by Dr. Scot Dock.   PMH significant for:  Diabetes, Hypertension, Renal Insufficiency Chronic and Gout and tobacco abuse disorder   Clinical Impression   This 75 yo male admitted with above presents to acute OT with deficits below affecting his ability to perform basic ADLs as he was accustomed to with just his foot issues pta. He will benefit from acute OT with follow up [place] to get back to a Mod I level.    Follow Up Recommendations  Home health OT;Supervision - Intermittent (with prn A with basic ADLs to make easier and safer)    Equipment Recommendations  None recommended by OT       Precautions / Restrictions Precautions Precautions: Fall Required Braces or Orthoses: Knee Immobilizer - Right Knee Immobilizer - Right: On at all times (no CPM, do NOT remove for PT) Other Brace/Splint: Pt with Darco shoe for rt foot Restrictions Weight Bearing Restrictions: No RLE Weight Bearing: Weight bearing as tolerated      Mobility Bed Mobility Overal bed mobility: Needs Assistance Bed Mobility: Supine to Sit     Supine to sit: Min guard     General bed mobility comments: Able to manage his own leg today  Transfers Overall transfer level: Needs assistance Equipment used: Rolling walker (2 wheeled) Transfers: Sit to/from Stand Sit to Stand: Supervision                   ADL Overall ADL's : Needs assistance/impaired Eating/Feeding: Independent;Sitting   Grooming: Supervision/safety;Standing   Upper Body Bathing: Set up;Sitting   Lower Body  Bathing: Moderate assistance (S sit<>stand)   Upper Body Dressing : Set up;Sitting   Lower Body Dressing: Moderate assistance (S sit<>stand) Lower Body Dressing Details (indicate cue type and reason): We dicussed the need for some sort of clothing for his LB that would go on over his KI (ie: sweat or athletic pants, shorts)--he verbalized understanding Toilet Transfer: Supervision/safety;Ambulation;RW;BSC (over toilet)   Toileting- Clothing Manipulation and Hygiene: Supervision/safety;Sit to/from stand     Tub/Shower Transfer Details (indicate cue type and reason): We dicussed that we would try the tub transfer tomorrow to see if there is a way to safely use the tub seat he already has for showering. He was made aware that he has to keep his KI on at all times and that he has to make sure that it does not get wet. He reported that pta he was showering with foot amputaitons by covering foot with ziploc bag and taping it up. I told him this would work for his leg too, but he would probably need to use a trash bag to be long enough to cover entire KI. I did tell him that if he sponge baths that he can take Ki off as long as he keeps knee straight and then immediately put KI back on--telling him that it would be best if his ex-wife helped with this.                   Pertinent Vitals/Pain Pain Assessment: 0-10 Pain Score: 4  Pain Location: right knee Pain Descriptors /  Indicators: Aching;Sore Pain Intervention(s): Limited activity within patient's tolerance;Repositioned;Monitored during session     Hand Dominance Right   Extremity/Trunk Assessment Upper Extremity Assessment Upper Extremity Assessment: Overall WFL for tasks assessed           Communication Communication Communication: No difficulties   Cognition Arousal/Alertness: Awake/alert Behavior During Therapy: Agitated (when we wanted him to do stairs a safer way) Overall Cognitive Status:  (decreased safety awareness  with use of RW and stairs)                                Home Living Family/patient expects to be discharged to:: Private residence Living Arrangements: Spouse/significant other (ex-wife) Available Help at Discharge: Family;Available PRN/intermittently Type of Home: Mobile home Home Access: Stairs to enter Entrance Stairs-Number of Steps: 6 Entrance Stairs-Rails: Left Home Layout: Multi-level     Bathroom Shower/Tub: Tub/shower unit;Curtain Shower/tub characteristics: Architectural technologist: Standard     Home Equipment: Environmental consultant - 2 wheels;Cane - single point;Walker - 4 wheels;Shower seat;Adaptive equipment Adaptive Equipment: Reacher;Sock aid Additional Comments: 6 steps w/ Lt rail up onto deck, 2 steps up to mud room w/o rail, then 1 step up into home w/o rail.  3 steps down to get to wood stove w/ Rt rail going down.        Prior Functioning/Environment Level of Independence: Independent with assistive device(s)        Comments: Using RW most of the time; Mod I for ADL's per pt report    OT Diagnosis: Generalized weakness;Acute pain   OT Problem List: Decreased strength;Decreased range of motion;Impaired balance (sitting and/or standing);Pain;Decreased safety awareness;Decreased knowledge of use of DME or AE   OT Treatment/Interventions: Self-care/ADL training;Patient/family education;Balance training;DME and/or AE instruction    OT Goals(Current goals can be found in the care plan section) Acute Rehab OT Goals Patient Stated Goal: To go home OT Goal Formulation: With patient Time For Goal Achievement: 04/24/15 Potential to Achieve Goals: Good  OT Frequency: Min 2X/week           Co-evaluation PT/OT/SLP Co-Evaluation/Treatment: Yes Reason for Co-Treatment: For patient/therapist safety   OT goals addressed during session: ADL's and self-care;Strengthening/ROM      End of Session Equipment Utilized During Treatment: Gait belt;Rolling walker;Right  knee immobilizer (wound vac RLE) Nurse Communication:  (Pt asking about who will address his right foot)  Activity Tolerance: Patient tolerated treatment well Patient left: in chair;with call bell/phone within reach;with chair alarm set   Time: [id number] OT Time Calculation (min): 35 min Charges:  OT General Charges $OT Visit: 1 Procedure OT Treatments $Self Care/Home Management : 8-22 mins  Almon Register [id number] 04/17/2015, 12:38 PM

## 2015-04-17 NOTE — Progress Notes (Signed)
Day shift RN acknowledged an order for an enema placed by the MD that the patient requested during her shift. Day shift RN did not administer enema per patient request. Night shift charge RN administered enema. Patient had complaints that he asked day shift RN all day about receiving an enema but that the day shift nurse would not administer it to him. Patient had a bowel movement. Nursing will continue to monitor.

## 2015-04-17 NOTE — Progress Notes (Signed)
[facility] for Infectious Disease   Reason for visit: Follow up on PJI  Interval History: on broad spectrum antibiotics.  Afebrile.  No chills.  Continued pain in knee. Asks why no one has evaluated his foot.  Asks if he would have become infected if he had stayed on antibiotics.   Physical Exam: Constitutional:  Filed Vitals:   04/17/15 0330 04/17/15 0617  BP: 139/59 170/67  Pulse: 86 71  Temp: 97.9 F (36.6 C) 97.7 F (36.5 C)  Resp: 16 16   patient appears in NAD Respiratory: Normal respiratory effort; CTA B Cardiovascular: RRR MS: right leg wrapped.  VAC tube noted.   Review of Systems: Constitutional: negative for fevers and chills Gastrointestinal: negative for diarrhea  Lab Results  Component Value Date   WBC 8.2 04/17/2015   HGB 7.1* 04/17/2015   HCT 22.3* 04/17/2015   MCV 94.9 04/17/2015   PLT 203 04/17/2015    Lab Results  Component Value Date   CREATININE 1.63* 04/17/2015   BUN 32* 04/17/2015   NA 141 04/17/2015   K 4.6 04/17/2015   CL 104 04/17/2015   CO2 28 04/17/2015    Lab Results  Component Value Date   ALT 7* 04/17/2015   AST 10* 04/17/2015   ALKPHOS 48 04/17/2015     Microbiology: Recent Results (from the past 240 hour(s))  Culture, blood (Routine X 2) w Reflex to ID Panel     Status: None (Preliminary result)   Collection Time: 04/13/15  9:11 PM  Result Value Ref Range Status   Specimen Description BLOOD LEFT HAND  Final   Special Requests BOTTLES DRAWN AEROBIC AND ANAEROBIC 5CC  Final   Culture NO GROWTH 3 DAYS  Final   Report Status PENDING  Incomplete  Culture, blood (Routine X 2) w Reflex to ID Panel     Status: None (Preliminary result)   Collection Time: 04/13/15  9:27 PM  Result Value Ref Range Status   Specimen Description BLOOD RIGHT WRIST  Final   Special Requests IN PEDIATRIC BOTTLE 2CC  Final   Culture NO GROWTH 3 DAYS  Final   Report Status PENDING  Incomplete  MRSA PCR Screening     Status: None   Collection  Time: 04/13/15 11:18 PM  Result Value Ref Range Status   MRSA by PCR NEGATIVE NEGATIVE Final    Comment:        The GeneXpert MRSA Assay (FDA approved for NASAL specimens only), is one component of a comprehensive MRSA colonization surveillance program. It is not intended to diagnose MRSA infection nor to guide or monitor treatment for MRSA infections.   Body fluid culture     Status: None (Preliminary result)   Collection Time: 04/14/15 10:14 AM  Result Value Ref Range Status   Specimen Description FLUID RIGHT KNEE  Final   Special Requests NONE  Final   Gram Stain   Final    MODERATE WBC PRESENT, PREDOMINANTLY PMN NO ORGANISMS SEEN    Culture NO GROWTH 3 DAYS  Final   Report Status PENDING  Incomplete    Impression/Plan:  1. 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  Gram stain with WBCs, no organisms.   Treat with vancomycin and ceftriaxone 2 grams daily for 6 weeks through April 27th per home health protocol to include twice weekly bmp, vancomycin trough We will arrange follow up in [facility] in 4-5 weeks  2. Foot wound - antibiotics as above.  Poor wound healing overall with continued smoking.    I will sign off, please call with questions. thanks

## 2015-04-17 NOTE — Progress Notes (Signed)
Pharmacy Antibiotic Note  Marc [name] is a 75 y.o. male admitted on 04/13/2015 with infected R total knee. Pharmacy has been consulted for Vancomycin dosing.    Trough is therapeutic at 17 this evening (~25 hr level), will continue current dosing  Plan: -Continue vancomycin 1250 mg IV q24h.  Goal trough 15-20 mcg/mL. -Ceftriaxone 2 grams q 24 hour per ID -Monitor CBC, renal fx, cultures and clinical progress - Plan is to treat with vancomycin and ceftriaxone 2 grams daily for 6 weeks through April 27th     Weight: 193 lb 3.2 oz (87.635 kg)  Temp (24hrs), Avg:98.1 F (36.7 C), Min:97.7 F (36.5 C), Max:98.6 F (37 C)   Recent Labs Lab 04/13/15 1900 04/13/15 2243 04/14/15 0652 04/15/15 0540 04/16/15 0455 04/17/15 0140 04/17/15 1615 04/17/15 1952  WBC 10.6*  --  7.9  --  7.5 8.2 7.5  --   CREATININE 2.15*  --  1.71* 1.70*  --  1.63*  --   --   LATICACIDVEN  --  1.7  --   --   --   --   --   --   VANCOTROUGH  --   --   --   --   --   --   --  17    Estimated Creatinine Clearance: 44.3 mL/min (by C-G formula based on Cr of 1.63).    No Known Allergies  Antimicrobials this admission: Vanc 3/16 >> Zosyn 3/16 >>3/20 Rocephin 3/20>>  Dose adjustments this admission: None   Microbiology results: 3/16 BCx2: ngtd 3/17 R knee fluid: ngtd 3/16 MRSA PCR: neg  Thank you for allowing [place] to participate in this patients care. Jens Som, PharmD Pager: [phone]  04/17/2015 8:49 PM

## 2015-04-17 NOTE — Progress Notes (Signed)
Physical Therapy Treatment Patient Details Name: Marc [name] MRN: [id number] DOB: [birth year] Today's Date: 04/17/2015    History of Present Illness Admitted for right knee washout and drain placement due to infection, s/p TKR (2012).  Patient also recently underwent femoral artery bypass and excisional debridement of right great toe amputation site, ray amputation of right second toe and placement of VAC on 03/21/15 by Dr. Scot Dock.   PMH significant for:  Diabetes, Hypertension, Renal Insufficiency Chronic and Gout and tobacco abuse disorder    PT Comments    Patient is progressing toward mobility goals. Pt would benefit from stair training again. Continue to progress as tolerated.   Follow Up Recommendations  Home health PT;Supervision for mobility/OOB     Equipment Recommendations  None recommended by PT    Recommendations for Other Services       Precautions / Restrictions Precautions Precautions: Fall Required Braces or Orthoses: Knee Immobilizer - Right Knee Immobilizer - Right: On at all times Other Brace/Splint: Pt with Darco shoe for rt foot Restrictions Weight Bearing Restrictions: No RLE Weight Bearing: Weight bearing as tolerated    Mobility  Bed Mobility Overal bed mobility: Needs Assistance Bed Mobility: Supine to Sit     Supine to sit: Min guard     General bed mobility comments: no physical assist needed; cues not to hold breathe and for technique; HOB elevated (pt reported he sleeps in lift chair at home) and use of bedrail  Transfers Overall transfer level: Needs assistance Equipment used: Rolling walker (2 wheeled) Transfers: Sit to/from Stand Sit to Stand: Supervision         General transfer comment: cues for hand placement and technique  Ambulation/Gait Ambulation/Gait assistance: +2 safety/equipment;Supervision (+2 for equipment management) Ambulation Distance (Feet): 80 Feet Assistive device: Rolling walker (2 wheeled) Gait  Pattern/deviations: Step-through pattern;Decreased stance time - right;Decreased step length - left;Trunk flexed     General Gait Details: cues for posture and position of RW; no unsteadiness   Stairs Stairs: Yes Stairs assistance: Min guard Stair Management: One rail Left;Forwards;Sideways Number of Stairs: 3 General stair comments: attempted to educate pt on safest technique; pt became very agitated and impulsive; therapist educated on sequencing and technique with use of hand rail; pt refused to attempt stairs backward using RW; pt cannot use both hand rails at same time at home  Wheelchair Mobility    Modified Rankin (Stroke Patients Only)       Balance Overall balance assessment: Needs assistance Sitting-balance support: No upper extremity supported;Feet supported Sitting balance-Leahy Scale: Good     Standing balance support: Bilateral upper extremity supported Standing balance-Leahy Scale: Fair                      Cognition Arousal/Alertness: Awake/alert Behavior During Therapy: Agitated Overall Cognitive Status: Within Functional Limits for tasks assessed                      Exercises      General Comments        Pertinent Vitals/Pain Pain Assessment: 0-10 Pain Score: 4  Pain Location: R knee Pain Descriptors / Indicators: Aching;Sore Pain Intervention(s): Limited activity within patient's tolerance;Monitored during session;Premedicated before session;Repositioned    Home Living Family/patient expects to be discharged to:: Private residence Living Arrangements: Spouse/significant other (ex-wife) Available Help at Discharge: Family;Available PRN/intermittently Type of Home: Mobile home Home Access: Stairs to enter Entrance Stairs-Rails: Left Home Layout: Multi-level Home Equipment: Gilford Rile - 2  wheels;Cane - single point;Walker - 4 wheels;Shower seat;Adaptive equipment Additional Comments: 6 steps w/ Lt rail up onto deck, 2 steps up to  mud room w/o rail, then 1 step up into home w/o rail.  3 steps down to get to wood stove w/ Rt rail going down.      Prior Function Level of Independence: Independent with assistive device(s)      Comments: Using RW most of the time; Mod I for ADL's per pt report   PT Goals (current goals can now be found in the care plan section) Acute Rehab PT Goals Patient Stated Goal: go home Progress towards PT goals: Progressing toward goals    Frequency  Min 5X/week    PT Plan Current plan remains appropriate    Co-evaluation   Reason for Co-Treatment: For patient/therapist safety PT goals addressed during session: Mobility/safety with mobility;Proper use of DME OT goals addressed during session: ADL's and self-care;Strengthening/ROM     End of Session Equipment Utilized During Treatment: Gait belt;Right knee immobilizer Activity Tolerance: Patient tolerated treatment well Patient left: in chair;with call bell/phone within reach;with chair alarm set     Time: [id number] PT Time Calculation (min) (ACUTE ONLY): 34 min  Charges:  $Gait Training: 8-22 mins                    G Codes:      Salina April, PTA Pager: [phone]   04/17/2015, 12:56 PM

## 2015-04-18 DIAGNOSIS — T560X1S Toxic effect of lead and its compounds, accidental (unintentional), sequela: Secondary | ICD-10-CM

## 2015-04-18 DIAGNOSIS — M1A10X0 Lead-induced chronic gout, unspecified site, without tophus (tophi): Secondary | ICD-10-CM

## 2015-04-18 LAB — RENAL FUNCTION PANEL
ANION GAP: 10 (ref 5–15)
Albumin: 2.3 g/dL — ABNORMAL LOW (ref 3.5–5.0)
BUN: 29 mg/dL — AB (ref 6–20)
CHLORIDE: 105 mmol/L (ref 101–111)
CO2: 28 mmol/L (ref 22–32)
Calcium: 9.3 mg/dL (ref 8.9–10.3)
Creatinine, Ser: 1.28 mg/dL — ABNORMAL HIGH (ref 0.61–1.24)
GFR calc Af Amer: >60 mL/min (ref >60)
GFR calc non Af Amer: 53 mL/min — ABNORMAL LOW (ref >60)
GLUCOSE: 164 mg/dL — AB (ref 65–99)
PHOSPHORUS: 3 mg/dL (ref 2.5–4.6)
POTASSIUM: 4.3 mmol/L (ref 3.5–5.1)
Sodium: 143 mmol/L (ref 135–145)

## 2015-04-18 LAB — CBC WITH DIFFERENTIAL/PLATELET
BASOS PCT: 0 %
Basophils Absolute: 0 10*3/uL (ref 0.0–0.1)
Eosinophils Absolute: 0.2 10*3/uL (ref 0.0–0.7)
Eosinophils Relative: 3 %
HEMATOCRIT: 25.6 % — AB (ref 39.0–52.0)
HEMOGLOBIN: 8.3 g/dL — AB (ref 13.0–17.0)
LYMPHS ABS: 2.6 10*3/uL (ref 0.7–4.0)
LYMPHS PCT: 36 %
MCH: 30.1 pg (ref 26.0–34.0)
MCHC: 32.4 g/dL (ref 30.0–36.0)
MCV: 92.8 fL (ref 78.0–100.0)
MONO ABS: 0.3 10*3/uL (ref 0.1–1.0)
MONOS PCT: 4 %
NEUTROS ABS: 4.3 10*3/uL (ref 1.7–7.7)
NEUTROS PCT: 57 %
Platelets: 203 10*3/uL (ref 150–400)
RBC: 2.76 MIL/uL — ABNORMAL LOW (ref 4.22–5.81)
RDW: 16.8 % — AB (ref 11.5–15.5)
WBC: 7.4 10*3/uL (ref 4.0–10.5)

## 2015-04-18 LAB — BODY FLUID CULTURE: Culture: NO GROWTH

## 2015-04-18 LAB — CULTURE, BLOOD (ROUTINE X 2)
Culture: NO GROWTH
Culture: NO GROWTH

## 2015-04-18 LAB — GLUCOSE, CAPILLARY
GLUCOSE-CAPILLARY: 275 mg/dL — AB (ref 65–99)
Glucose-Capillary: 147 mg/dL — ABNORMAL HIGH (ref 65–99)
Glucose-Capillary: 141 mg/dL — ABNORMAL HIGH (ref 65–99)

## 2015-04-18 MED ORDER — FUROSEMIDE 80 MG PO TABS
40.0000 mg | ORAL_TABLET | Freq: Every day | ORAL | Status: DC
Start: 2015-04-18 — End: 2015-10-25

## 2015-04-18 MED ORDER — HEPARIN SOD (PORK) LOCK FLUSH 100 UNIT/ML IV SOLN
250.0000 [IU] | INTRAVENOUS | Status: AC | PRN
  Administered 2015-04-18: 250 [IU]

## 2015-04-18 MED ORDER — AMLODIPINE BESYLATE 10 MG PO TABS: 10.0000 mg | ORAL_TABLET | Freq: Every day | ORAL | Status: DC

## 2015-04-18 MED ORDER — DEXTROSE 5 % IV SOLN: 2.0000 g | INTRAVENOUS | Status: DC

## 2015-04-18 MED ORDER — SODIUM CHLORIDE 0.9 % IV SOLN: 10.0000 mL | Freq: Once | INTRAVENOUS | Status: DC

## 2015-04-18 MED ORDER — VANCOMYCIN HCL 10 G IV SOLR: 1250.0000 mg | INTRAVENOUS | Status: DC

## 2015-04-18 NOTE — Progress Notes (Signed)
  [facility] Progress Note  Subjective   Worried about losing his leg.   Objective Filed Vitals:   04/18/15 0202 04/18/15 0458  BP: 160/62 151/61  Pulse: 44 55  Temp: 98.2 F (36.8 C) 98 F (36.7 C)  Resp:  17    Intake/Output Summary (Last 24 hours) at 04/18/15 1147 Last data filed at 04/18/15 [id number]  Gross per 24 hour  Intake   1415 ml  Output   1000 ml  Net    415 ml   Right 1st and 2nd toe amputation site is clean and healing nicely with beefy red granulation tissue. Mild bleeding at tissue bed. No exposed tendon or bone.   Right heel with bleeding from small ulcer.    Assessment/Planning: 75 y.o. male s/p right great toe and 2nd toe ray amputations, right femoral to tibial peroneal trunk bypass.   Right foot amputation site is healing well. Can discontinue VAC and start wet to dry (with NS) dressings twice daily. Apply wet to dry dressing to right heel twice daily. Wrap with kerlix.   For d/c home with home health today.  F/u in 2 weeks with Dr. Scot Dock.   Alvia Grove 04/18/2015 11:47 AM --  Laboratory CBC    Component Value Date/Time   WBC 7.4 04/18/2015 0632   HGB 8.3* 04/18/2015 0632   HCT 25.6* 04/18/2015 0632   HCT 23.5* 03/19/2015 1905   PLT 203 04/18/2015 0632    BMET    Component Value Date/Time   NA 143 04/18/2015 0632   K 4.3 04/18/2015 0632   CL 105 04/18/2015 0632   CO2 28 04/18/2015 0632   GLUCOSE 164* 04/18/2015 0632   BUN 29* 04/18/2015 0632   CREATININE 1.28* 04/18/2015 0632   CALCIUM 9.3 04/18/2015 0632   GFRNONAA 53* 04/18/2015 0632   GFRAA >60 04/18/2015 0632    COAG Lab Results  Component Value Date   INR 1.04 04/13/2015   INR 1.12 02/07/2015   INR 1.03 07/26/2014   No results found for: PTT  Antibiotics Anti-infectives    Start     Dose/Rate Route Frequency Ordered Stop   04/17/15 1300  cefTRIAXone (ROCEPHIN) 2 g in dextrose 5 % 50 mL IVPB     2 g 100 mL/hr over 30 Minutes Intravenous Every  24 hours 04/17/15 1209     04/14/15 1800  vancomycin (VANCOCIN) 1,250 mg in sodium chloride 0.9 % 250 mL IVPB     1,250 mg 166.7 mL/hr over 90 Minutes Intravenous Every 24 hours 04/13/15 2158     04/14/15 0100  piperacillin-tazobactam (ZOSYN) IVPB 3.375 g  Status:  Discontinued     3.375 g 12.5 mL/hr over 240 Minutes Intravenous Every 8 hours 04/13/15 2158 04/17/15 1209   04/13/15 1800  vancomycin (VANCOCIN) 2,000 mg in sodium chloride 0.9 % 500 mL IVPB     2,000 mg 250 mL/hr over 120 Minutes Intravenous  Once 04/13/15 1759 04/13/15 2059   04/13/15 1800  piperacillin-tazobactam (ZOSYN) IVPB 3.375 g     3.375 g 100 mL/hr over 30 Minutes Intravenous  Once 04/13/15 1759 04/13/15 1928       Virgina Jock, PA-C [facility] Office: [phone] Pager: [phone] 04/18/2015 11:47 AM

## 2015-04-18 NOTE — Progress Notes (Addendum)
Occupational Therapy Treatment Patient Details Name: Marc [name] MRN: [id number] DOB: 10/09/1940 Today's Date: 04/18/2015    History of present illness Admitted for right knee washout and drain placement due to infection, s/p TKR (2012).  Patient also recently underwent femoral artery bypass and excisional debridement of right great toe amputation site, ray amputation of right second toe and placement of VAC on 03/21/15 by Dr. Scot Dock.   PMH significant for:  Diabetes, Hypertension, Renal Insufficiency Chronic and Gout and tobacco abuse disorder   OT comments  This 75 yo male admitted with above presents to acute OT with deficits below affecting his ability to be totally independent/Mod I at this time; he is making progress with basic ADLs. He reports he will have A at home. He will continue to benefit from OT  Follow Up Recommendations  Home health OT;Supervision - Intermittent (with prn A with basic LB ADLs)    Equipment Recommendations  None recommended by OT       Precautions / Restrictions Precautions Precautions: Fall Required Braces or Orthoses: Knee Immobilizer - Right Knee Immobilizer - Right: On at all times Other Brace/Splint: Pt with Darco shoe for rt foot Restrictions Weight Bearing Restrictions: No RLE Weight Bearing: Weight bearing as tolerated       Mobility Bed Mobility Overal bed mobility: Modified Independent Bed Mobility: Supine to Sit     Supine to sit: HOB elevated;Modified independent (Device/Increase time) (use of rail)     General bed mobility comments: Up in chair upon arrival  Transfers Overall transfer level: Needs assistance Equipment used: Rolling walker (2 wheeled) Transfers: Sit to/from Stand Sit to Stand: Supervision         General transfer comment: X 2 from recliner chair; supervision for safety; carry over of safe hand placement and technique; assist with management of lines/drains    Balance Overall balance assessment: Needs  assistance Sitting-balance support: No upper extremity supported;Feet supported Sitting balance-Leahy Scale: Good     Standing balance support: During functional activity;Single extremity supported Standing balance-Leahy Scale: Fair                     ADL Overall ADL's : Needs assistance/impaired                                 Tub/ Shower Transfer: Tub transfer;Supervision/safety;Ambulation;Rolling walker;3 in 1 Tub/Shower Transfer Details (indicate cue type and reason): He is aware that he has to keep his knee and KI dry if he is going to try and take a shower   General ADL Comments: Pt able to don his left shoe while seated EOB after I handed it to him. He is not able to don his [place] at this time due to his [place], but reports who is with him at home can don is Darco shoe for him. He said he has thought about what he will wear at home to accomodated the [place] (I had recommended shorts or sweat/atheltic pants yesterday).                Cognition   Behavior During Therapy: Agitated (over world news and that he is not discharged yet) Overall Cognitive Status: Within Functional Limits for tasks assessed  Pertinent Vitals/ Pain       Pain Assessment: No/denies pain Pain Intervention(s): Monitored during session         Frequency Min 2X/week     Progress Toward Goals  OT Goals(current goals can now be found in the care plan section)  Progress towards OT goals: Progressing toward goals  Acute Rehab OT Goals Patient Stated Goal: go home  Plan Discharge plan remains appropriate    Co-evaluation      Reason for Co-Treatment: For patient/therapist safety (multiple lines) PT goals addressed during session: Mobility/safety with mobility;Proper use of DME OT goals addressed during session: ADL's and self-care;Strengthening/ROM      End of Session Equipment Utilized During Treatment: Gait belt;Rolling  walker;Right knee immobilizer (Darco shoe for RLE)   Activity Tolerance Patient tolerated treatment well   Patient Left in chair;with call bell/phone within reach;with chair alarm set   Nurse Communication          Time: [id number] OT Time Calculation (min): 28 min  Charges: OT General Charges $OT Visit: 1 Procedure OT Treatments $Self Care/Home Management : 8-22 mins  Almon Register [id number] 04/18/2015, 4:17 PM

## 2015-04-18 NOTE — Discharge Summary (Addendum)
Physician Discharge Summary  Marc [name] [id number] DOB: 1940/11/30 DOA: 04/13/2015  PCP: [name], MD  Admit date: 04/13/2015 Discharge date: 04/18/2015  Time spent: 45 minutes  Recommendations for Outpatient Follow-up:  Med changes on d/c home Order on Discharge  - amLODipine (NORVASC) 10 MG tablet  -Lasix dose cut back to 40 mg daily from 80 -Continue potassium replacement as well -Lisinopril discontinue does not take at home - cefTRIAXone 2 g in dextrose 5 % 50 mL - sodium chloride 0.9 % infusion -->End date 4/27 - vancomycin 1,250 mg in sodium chloride 0.9 % 250 mL --->end date 4/27  Script given for - oxyCODONE -Continue tizanidine 2 mg 3 times a day  1. Recommend screening HbA1c in about 4-6 weeks to rule out need for insulin initiation-he is on pioglitazone as well as Amaryl 4 mg twice a day at home 2. Recommend down titration to discontinuation off of prednisone as an outpatient to promote wound healing 3. Patient has been recommended by multiple providers and multiple times to quit smoking and states that "I will quit on my own" he is also been offered a nicotine patch and cessation counseling 4. He is at VERY high risk for amputation and understands--he should follow with Dr. 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 Active Problems:   Hx of peptic ulcer   Tobacco abuse disorder   Spinal stenosis of lumbar region   Type 2 diabetes mellitus with peripheral neuropathy (HCC)   Gout   Hyperlipidemia   GERD (gastroesophageal reflux disease)   Peripheral vascular disease with claudication (HCC)   Chronic kidney disease stage 3   CAD (coronary artery disease), native coronary artery   Ascending aortic aneurysm (HCC)   Acute renal failure superimposed on stage 3 chronic kidney disease (HCC)   Right foot infection   Septic joint of right knee joint (HCC)   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 LLL Lung nodule Chr Diastolic HF last EF Asc Aortic aneursym  Admitted with infection and was taken to OR for removal of infected hardware Transfused 3.18 Cut elbow 3.18   Hospital Course:    1. Complicated RLE wound in setting prior Fem-pops/p Wash-out-Continue IV Vanc and zosyn -Appreicate input from ID Dr. Baxter Flattery.  PICC order placed-follow up Cultures from wash-out 3/17 to narrow--NGTD so far from Blood cult x 2 3.16, INtra-op cultures 3.17--Per ID can discharge on vancomycin and Zosyn for 6 weeks via right upper extremity PICC line. Discontinued IV pain meds 3/20 discharge home on scheduled percocet . saline lock iv.    Ambulating somewhat--?Amputation eventually--will need close OP follow up 2. R elbow skin tear-Wrapped with pressure dressing.  Monitor while on high-dose aspirin 3. Hyperkalemia-resolving-currently potassium  4.6 4. ABLA-secondary to Anemia of blood loss as well as Skin tear R elbow.  Is on ASA and Plavix at home. Seems like he would benefit from aspirin 325 twice a day for 2 weeks for DVT prophylaxis.  D/w Dr Oneida Alar 3/21-from Vascular perspective, no need for Plavix and can discontinue. Transfused 3.19.17 2 U Prbc, transfused 1 unit PRBC 3/20- Hg stable at 8.3 5. DM tyII-last A1c 7.6 on 03/19/15 cbg  147-141 .  Patient non-compliant on diet.  Increased lantus 7U--12 units on 3.20.  On d/c home was sent home on Pioglitazone 30 daily, Amaryl 4wice a day--Needs OP follow up 6. Spinal stenosis-on chr prednisone-Stenosis. No tapering required at present however we'll need to be addressed moving forward as an outpatient 7. 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 Acute kidney injury-monitor Bmet in am.  On admit Creat 2.15--->1.71 on 3/17.  PTA lasix 80 bid, Lisinopril 20 mg held--stable and improving creatinine 1.7-1.3--->1.28 currently. On discharge started back on 40 of Lasix by mouth and lisinopril he was noncompliant with therefore we discontinued this  9. Gout-continue colchicine 0.6 daily, allopurinol 300 twice a da-stable 10. Hypertension-poorly controlled, continue higher dose of amlodipine at 10 mg dose 11. History of hypokalemia probably secondary to Lasix-potassium on hold, during hospital stay and transiently was elevated-will need to continue replacement as an outpatient 12. Hyperlipidemia-continue 10 mg daily at bedtime defer to PCP regarding dosage change given significant ASCVD   Procedures:  Patient underwent infected right total knee radical irrigation and debridement 3/17  Consultations:  Ortho  ID  VVS  Discharge Exam: Filed Vitals:   04/18/15 0202 04/18/15 0458  BP: 160/62 151/61  Pulse: 44 55  Temp: 98.2 F (36.8 C) 98 F (36.7 C)  Resp:  17   Alert pleasant oriented and in NAD General: Doing fair no other issues Cardiovascular: S1-S2 no murmur rub or gallop Respiratory: Clinically clear no added sound  Discharge Instructions   Discharge Instructions    Weight bearing as tolerated    Complete by:  As directed           Current Discharge Medication List    START taking these medications   Details  amLODipine (NORVASC) 10 MG tablet Take 1 tablet (10 mg total) by mouth daily. Qty: 30  tablet, Refills: 0    cefTRIAXone 2 g in dextrose 5 % 50 mL Inject 2 g into the vein daily. Qty: 2 g, Refills: 0    sodium chloride 0.9 % infusion Inject 10 mLs into the vein once. Qty: 10 mL, Refills: 0    tizanidine (ZANAFLEX) 2 MG capsule Take 1 capsule (2 mg total) by mouth 3 (three) times daily. Qty: 60 capsule, Refills: 0    vancomycin 1,250 mg in sodium chloride 0.9 % 250 mL Inject 1,250 mg into the vein daily. Qty: 1250 mg, Refills: 0      CONTINUE these medications which have CHANGED   Details  aspirin EC 325 MG tablet Take 1 tablet (325 mg total) by mouth 2 (two) times daily. Qty: 30 tablet, Refills: 0    furosemide (LASIX) 80 MG tablet Take 0.5 tablets (40 mg total) by mouth daily.    oxyCODONE-acetaminophen (ROXICET) 5-325 MG tablet Take 1 tablet by mouth every 4 (four) hours as needed. Qty: 60 tablet, Refills: 0      CONTINUE these medications which have NOT CHANGED   Details  acetaminophen (TYLENOL) 500 MG tablet Take 1,000 mg  by mouth every 6 (six) hours as needed for fever.    atorvastatin (LIPITOR) 10 MG tablet Take 10 mg by mouth at bedtime.     citalopram (CELEXA) 20 MG tablet Take 20 mg by mouth daily.    colchicine 0.6 MG tablet Take 1 tablet (0.6 mg total) by mouth daily. Qty: 10 tablet, Refills: 0    collagenase (SANTYL) ointment Apply 1 application topically every other day.    gabapentin (NEURONTIN) 600 MG tablet Take 1,200 mg by mouth 2 (two) times daily.     glimepiride (AMARYL) 4 MG tablet Take 4 mg by mouth 2 (two) times daily.     pioglitazone (ACTOS) 30 MG tablet Take 30 mg by mouth daily.    predniSONE (DELTASONE) 10 MG tablet Take 10 mg by mouth daily with breakfast.     allopurinol (ZYLOPRIM) 300 MG tablet Take 300 mg by mouth 2 (two) times daily.     potassium gluconate 595 MG TABS tablet Take 595 mg by mouth at bedtime.       STOP taking these medications     clopidogrel (PLAVIX) 75 MG tablet      lisinopril  (PRINIVIL,ZESTRIL) 20 MG tablet      pantoprazole (PROTONIX) 40 MG tablet        No Known Allergies Follow-up Information    Follow up with [name], MD In 2 weeks.   Specialty:  Orthopedic Surgery   Contact information:   [place] [address] [phone]        The results of significant diagnostics from this hospitalization (including imaging, microbiology, ancillary and laboratory) are listed below for reference.    Significant Diagnostic Studies: No results found.  Microbiology: Recent Results (from the past 240 hour(s))  Culture, blood (Routine X 2) w Reflex to ID Panel     Status: None (Preliminary result)   Collection Time: 04/13/15  9:11 PM  Result Value Ref Range Status   Specimen Description BLOOD LEFT HAND  Final   Special Requests BOTTLES DRAWN AEROBIC AND ANAEROBIC 5CC  Final   Culture NO GROWTH 4 DAYS  Final   Report Status PENDING  Incomplete  Culture, blood (Routine X 2) w Reflex to ID Panel     Status: None (Preliminary result)   Collection Time: 04/13/15  9:27 PM  Result Value Ref Range Status   Specimen Description BLOOD RIGHT WRIST  Final   Special Requests IN PEDIATRIC BOTTLE 2CC  Final   Culture NO GROWTH 4 DAYS  Final   Report Status PENDING  Incomplete  MRSA PCR Screening     Status: None   Collection Time: 04/13/15 11:18 PM  Result Value Ref Range Status   MRSA by PCR NEGATIVE NEGATIVE Final    Comment:        The GeneXpert MRSA Assay (FDA approved for NASAL specimens only), is one component of a comprehensive MRSA colonization surveillance program. It is not intended to diagnose MRSA infection nor to guide or monitor treatment for MRSA infections.   Body fluid culture     Status: None (Preliminary result)   Collection Time: 04/14/15 10:14 AM  Result Value Ref Range Status   Specimen Description FLUID RIGHT KNEE  Final   Special Requests NONE  Final   Gram Stain   Final    MODERATE WBC PRESENT, PREDOMINANTLY  PMN NO ORGANISMS SEEN    Culture NO GROWTH 3 DAYS  Final   Report Status PENDING  Incomplete  Labs: Basic Metabolic Panel:  Recent Labs Lab 04/13/15 1900 04/14/15 0652 04/15/15 0540 04/17/15 0140 04/18/15 0632  NA 137 138 138 141 143  K 4.3 4.0 5.0 4.6 4.3  CL 95* 102 104 104 105  CO2 28 26 24 28 28   GLUCOSE 276* 212* 237* 213* 164*  BUN 47* 42* 33* 32* 29*  CREATININE 2.15* 1.71* 1.70* 1.63* 1.28*  CALCIUM 9.8 9.1 8.5* 8.9 9.3  PHOS  --   --   --   --  3.0   Liver Function Tests:  Recent Labs Lab 04/17/15 0140 04/18/15 0632  AST 10*  --   ALT 7*  --   ALKPHOS 48  --   BILITOT 0.4  --   PROT 5.5*  --   ALBUMIN 2.3* 2.3*   No results for input(s): LIPASE, AMYLASE in the last 168 hours. No results for input(s): AMMONIA in the last 168 hours. CBC:  Recent Labs Lab 04/13/15 1900 04/14/15 0652 04/16/15 0455 04/17/15 0140 04/17/15 1615 04/18/15 0632  WBC 10.6* 7.9 7.5 8.2 7.5 7.4  NEUTROABS 7.7  --  5.5 5.9  --  4.3  HGB 11.2* 9.4* 7.1* 7.1* 7.9* 8.3*  HCT 35.0* 28.6* 22.2* 22.3* 24.6* 25.6*  MCV 96.7 96.3 97.4 94.9 93.5 92.8  PLT 277 215 213 203 212 203   Cardiac Enzymes: No results for input(s): CKTOTAL, CKMB, CKMBINDEX, TROPONINI in the last 168 hours. BNP: BNP (last 3 results)  Recent Labs  04/14/15 0652  BNP 21.7    ProBNP (last 3 results) No results for input(s): PROBNP in the last 8760 hours.  CBG:  Recent Labs Lab 04/17/15 1133 04/17/15 1640 04/18/15 0049 04/18/15 0656 04/18/15 1142  GLUCAP 191* 309* 275* 147* 141*       Signed:  Nita Sells MD   [facility] 04/18/2015, 12:23 PM

## 2015-04-18 NOTE — Progress Notes (Signed)
Physical Therapy Treatment Patient Details Name: Marc [name] MRN: [id number] DOB: 09-16-1940 Today's Date: 04/18/2015    History of Present Illness Admitted for right knee washout and drain placement due to infection, s/p TKR (2012).  Patient also recently underwent femoral artery bypass and excisional debridement of right great toe amputation site, ray amputation of right second toe and placement of VAC on 03/21/15 by Dr. Scot Dock.   PMH significant for:  Diabetes, Hypertension, Renal Insufficiency Chronic and Gout and tobacco abuse disorder    PT Comments    Patient continues to progress toward PT goals and needs assistance mainly with management of lines/drains. Current plan remains appropriate.   Follow Up Recommendations  Home health PT;Supervision for mobility/OOB     Equipment Recommendations  None recommended by PT    Recommendations for Other Services       Precautions / Restrictions Precautions Precautions: Fall Required Braces or Orthoses: Knee Immobilizer - Right Knee Immobilizer - Right: On at all times Other Brace/Splint: Pt with Darco shoe for rt foot Restrictions Weight Bearing Restrictions: No RLE Weight Bearing: Weight bearing as tolerated    Mobility  Bed Mobility               General bed mobility comments: Up in chair upon arrival  Transfers Overall transfer level: Needs assistance Equipment used: Rolling walker (2 wheeled) Transfers: Sit to/from Stand Sit to Stand: Supervision         General transfer comment: X 2 from recliner chair; supervision for safety; carry over of safe hand placement and technique; assist with management of lines/drains  Ambulation/Gait Ambulation/Gait assistance: Min guard;+2 safety/equipment Ambulation Distance (Feet): 100 Feet Assistive device: Rolling walker (2 wheeled) Gait Pattern/deviations: Step-through pattern;Decreased stance time - right;Decreased step length - left;Decreased stride length;Trunk  flexed     General Gait Details: cues for posture and position of RW; steady gait    Stairs Stairs: Yes Stairs assistance: +2 safety/equipment Stair Management: One rail Left;Forwards;Sideways Number of Stairs: 2 General stair comments: pt with less impulsivity during stair training this session; educated pt on sequencing and technique with pt following command inconsistently but with no unsteadiness; assist for management of lines/drains   Wheelchair Mobility    Modified Rankin (Stroke Patients Only)       Balance Overall balance assessment: Needs assistance Sitting-balance support: No upper extremity supported;Feet supported Sitting balance-Leahy Scale: Good     Standing balance support: During functional activity;Single extremity supported Standing balance-Leahy Scale: Fair                      Cognition Arousal/Alertness: Awake/alert Behavior During Therapy: Agitated Overall Cognitive Status: Within Functional Limits for tasks assessed                      Exercises      General Comments General comments (skin integrity, edema, etc.): pt very addiment about not having physical assistance and continues to be easily agitated but with less impulsivity throughout session      Pertinent Vitals/Pain Pain Assessment: No/denies pain Pain Intervention(s): Monitored during session    Home Living                      Prior Function            PT Goals (current goals can now be found in the care plan section) Acute Rehab PT Goals Patient Stated Goal: go home Progress towards PT goals: Progressing  toward goals    Frequency  Min 5X/week    PT Plan Current plan remains appropriate    Co-evaluation   Reason for Co-Treatment: For patient/therapist safety PT goals addressed during session: Mobility/safety with mobility;Proper use of DME       End of Session Equipment Utilized During Treatment: Gait belt;Right knee  immobilizer Activity Tolerance: Patient tolerated treatment well Patient left: in chair;with call bell/phone within reach;with chair alarm set     Time: [id number] PT Time Calculation (min) (ACUTE ONLY): 24 min  Charges:  $Gait Training: 8-22 mins                    G Codes:      Salina April, PTA Pager: [phone]   04/18/2015, 3:03 PM

## 2015-04-18 NOTE — Anesthesia Postprocedure Evaluation (Signed)
Anesthesia Post Note  Patient: Marc [name]  Procedure(s) Performed: Procedure(s) (LRB): RIGHT KNEE [place] OUT AND PLACEMENT OF DRAINS (Right) IRRIGATION AND DEBRIDEMENT KNEE (Right)  Patient location during evaluation: PACU Anesthesia Type: General Level of consciousness: awake and alert and patient cooperative Pain management: pain level controlled Vital Signs Assessment: post-procedure vital signs reviewed and stable Respiratory status: spontaneous breathing and respiratory function stable Cardiovascular status: stable Anesthetic complications: no    Last Vitals:  Filed Vitals:   04/18/15 0202 04/18/15 0458  BP: 160/62 151/61  Pulse: 44 55  Temp: 36.8 C 36.7 C  Resp:  17    Last Pain:  Filed Vitals:   04/18/15 0636  PainSc: [place]

## 2015-04-18 NOTE — Progress Notes (Signed)
Patient ID: AURTHUR WATER, male   DOB: 1940/11/05, 75 y.o.   MRN: [id number] Subjective: Patient reports overall pain is diminished in his right lower extremity, specifically his knee that underwent irrigation and debridement, with revision of polyethylene liner. Drains were removed yesterday from the total knee and there has not been a reaccumulation of fluid. 4 days ago.patient was able walk 80 feet with physical therapy.  Objective: Fluid aspirated from the knee5 days agohad 50,000 white cells, but so far no growth.  This fluid was taken when he was off of antibiotics for 10 days because he was unable to fill his prescription for doxycycline.  I did remove his knee immobilizer today, there is dried blood at the drain site.  The dressing was removed and the wound is clean and dry.  There has been no reaccumulation of fluid in the knee.peripheral white count this morning was normal and his glucose was 164. The VAC dressing is intact.  When I looked at the back site under the dressing..  The skin was pink and there is no obvious purulence.  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  We're unable to remove all of his implants because to do so would have damaged his recent vascular reconstruction with the hook up at vesselsright behind the knee.  Plan: Continue IV antibiotics for 6 weeks per infectious diseases, after that, I would put him on doxycycline for the rest of his life. If we can keep his infection suppressed.  He will probably be able to keep his leg for a prolonged period of time.  If not, he is a candidate for above-knee amputation and he understands this. I would continue the knee immobilizer until I see him back in the office in a few weeks.  I would encourage  Placement in a short term nursing facility since he has very little support at home,  and will undoubtedly resume smoking as soon as he gets home.

## 2015-04-20 LAB — TYPE AND SCREEN
ABO/RH(D): O NEG
Antibody Screen: NEGATIVE
UNIT DIVISION: 0
UNIT DIVISION: 0
Unit division: 0

## 2015-05-16 ENCOUNTER — Encounter (HOSPITAL_BASED_OUTPATIENT_CLINIC_OR_DEPARTMENT_OTHER): Payer: Medicare Other | Attending: Surgery

## 2015-05-16 ENCOUNTER — Telehealth: Payer: Self-pay | Admitting: *Deleted

## 2015-05-16 DIAGNOSIS — I10 Essential (primary) hypertension: Secondary | ICD-10-CM | POA: Insufficient documentation

## 2015-05-16 DIAGNOSIS — L97512 Non-pressure chronic ulcer of other part of right foot with fat layer exposed: Secondary | ICD-10-CM | POA: Insufficient documentation

## 2015-05-16 DIAGNOSIS — Z96651 Presence of right artificial knee joint: Secondary | ICD-10-CM | POA: Insufficient documentation

## 2015-05-16 DIAGNOSIS — F17210 Nicotine dependence, cigarettes, uncomplicated: Secondary | ICD-10-CM | POA: Insufficient documentation

## 2015-05-16 DIAGNOSIS — Z89411 Acquired absence of right great toe: Secondary | ICD-10-CM | POA: Insufficient documentation

## 2015-05-16 DIAGNOSIS — E11621 Type 2 diabetes mellitus with foot ulcer: Secondary | ICD-10-CM | POA: Diagnosis present

## 2015-05-16 DIAGNOSIS — Z794 Long term (current) use of insulin: Secondary | ICD-10-CM | POA: Diagnosis not present

## 2015-05-16 DIAGNOSIS — I2510 Atherosclerotic heart disease of native coronary artery without angina pectoris: Secondary | ICD-10-CM | POA: Insufficient documentation

## 2015-05-16 DIAGNOSIS — E1140 Type 2 diabetes mellitus with diabetic neuropathy, unspecified: Secondary | ICD-10-CM | POA: Diagnosis not present

## 2015-05-16 NOTE — Telephone Encounter (Signed)
[facility] Pharmacist does not know of any drug related problems.

## 2015-05-16 NOTE — Telephone Encounter (Signed)
-----   Message from [name], MD sent at 05/16/2015  9:18 AM EDT ----- He has significant eosinophils on his antibiotics.  Can you find out from [facility] if he has any issues like rash, itching or anything unusual?  thanks

## 2015-05-24 DIAGNOSIS — E11621 Type 2 diabetes mellitus with foot ulcer: Secondary | ICD-10-CM | POA: Diagnosis not present

## 2015-05-31 ENCOUNTER — Encounter (HOSPITAL_BASED_OUTPATIENT_CLINIC_OR_DEPARTMENT_OTHER): Payer: Medicare Other | Attending: Surgery

## 2015-05-31 DIAGNOSIS — Z89411 Acquired absence of right great toe: Secondary | ICD-10-CM | POA: Diagnosis not present

## 2015-05-31 DIAGNOSIS — E11621 Type 2 diabetes mellitus with foot ulcer: Secondary | ICD-10-CM | POA: Diagnosis present

## 2015-05-31 DIAGNOSIS — I10 Essential (primary) hypertension: Secondary | ICD-10-CM | POA: Insufficient documentation

## 2015-05-31 DIAGNOSIS — I2510 Atherosclerotic heart disease of native coronary artery without angina pectoris: Secondary | ICD-10-CM | POA: Insufficient documentation

## 2015-05-31 DIAGNOSIS — E1140 Type 2 diabetes mellitus with diabetic neuropathy, unspecified: Secondary | ICD-10-CM | POA: Diagnosis not present

## 2015-05-31 DIAGNOSIS — Z8631 Personal history of diabetic foot ulcer: Secondary | ICD-10-CM | POA: Insufficient documentation

## 2015-06-01 ENCOUNTER — Encounter: Payer: Self-pay | Admitting: Internal Medicine

## 2015-06-01 ENCOUNTER — Ambulatory Visit (INDEPENDENT_AMBULATORY_CARE_PROVIDER_SITE_OTHER): Payer: Medicare Other | Admitting: Internal Medicine

## 2015-06-01 ENCOUNTER — Telehealth: Payer: Self-pay

## 2015-06-01 VITALS — BP 190/64 | HR 58 | Temp 97.6°F | Wt 198.0 lb

## 2015-06-01 DIAGNOSIS — T8459XD Infection and inflammatory reaction due to other internal joint prosthesis, subsequent encounter: Secondary | ICD-10-CM

## 2015-06-01 DIAGNOSIS — Z96659 Presence of unspecified artificial knee joint: Principal | ICD-10-CM

## 2015-06-01 DIAGNOSIS — T8450XD Infection and inflammatory reaction due to unspecified internal joint prosthesis, subsequent encounter: Secondary | ICD-10-CM

## 2015-06-01 DIAGNOSIS — Z720 Tobacco use: Secondary | ICD-10-CM | POA: Diagnosis not present

## 2015-06-01 DIAGNOSIS — L089 Local infection of the skin and subcutaneous tissue, unspecified: Secondary | ICD-10-CM

## 2015-06-01 MED ORDER — CEFUROXIME AXETIL 500 MG PO TABS
500.0000 mg | ORAL_TABLET | Freq: Two times a day (BID) | ORAL | Status: DC
Start: 2015-06-01 — End: 2015-09-14

## 2015-06-01 MED ORDER — DOXYCYCLINE HYCLATE 100 MG PO TABS: 100.0000 mg | ORAL_TABLET | Freq: Two times a day (BID) | ORAL | Status: DC

## 2015-06-01 NOTE — Assessment & Plan Note (Signed)
Improving.  Labs reassuring.  I will transition him to oral antibiotics and rtc 2 months.  [facility] to pull out picc line.

## 2015-06-01 NOTE — Assessment & Plan Note (Signed)
Healing well.

## 2015-06-01 NOTE — Telephone Encounter (Signed)
Called [facility] Hawking at [facility] and gave Dr. Linus Salmons verbal order to pull PICC today. Rodman Key, LPN

## 2015-06-01 NOTE — Progress Notes (Signed)
   Subjective:    Patient ID: Marc [name], male    DOB: 07/30/1940, 75 y.o.   MRN: [id number]  HPI Here for follow up of PJI.  Marc [name] is a 75 y.o. male with type 2 DM, severe PVD, CKD3, CAD, smoker who has hx of right TKA 2015, most recently had vascular surgery of right femoral to tibial peroneal trunk bypass with vein graft, endarectomy of tibial peroneal trunk with vein patch angioplasty and first ray amputation and placement of wound vac for right great toe dry gangrene on 1/10. He had post operative swelling of right leg and poor wound healing of right foot wound thus on 2/21 underwent I x D, plus 2nd toe ray amputation and wound vac placement. OR cultures grew MRSA and he was discharged on doxycycline for 2 wk which he completed in early March. Thus while off of abtx, he started to have swelling , erythema, and pain to right prosthetic knee join starting on 3/12, he went to Dr. [name] for evaluation as an outpatient and underwent arthrocentesis which was concerning for infection given cloudy fluid. (unclear of cell coutn and culture results). Initially discussed 2 staged revision though the anastomosis of bypass is near his knee that it would likely be compromised if doing 2 staged revision. He was admitted on 3/17 for I x D, exchange of tibial bearing components. OR note suggests early infection,as does clinical history.  He was started on vancomycin and ceftriaxone for projected 6 weeks through 4/27.  Culture remained negative.  Initial CRP of 12 and ESR of 119.  Since then his ESR has reduced to 29 and CRP to 9.0.  CRP though has fluctuated a bit, has been higher from labs from [place] when [place] labs earlier in the course the CRP was normal.  He also had some increase in eosinophilia that resolved and he had remained asymptomatic with no itching, no other issues.  Foot doing well and no longer needs wound care appts.  Knee hurts but no warmth.     Review of Systems    Constitutional: Negative for fever and chills.  Gastrointestinal: Negative for diarrhea.  Skin: Negative for rash.  Neurological: Negative for dizziness.       Objective:   Physical Exam  Constitutional: He appears well-developed and well-nourished. No distress.  Eyes: No scleral icterus.  Cardiovascular: Normal rate, regular rhythm and normal heart sounds.   Neurological: He is alert.  Skin: No rash noted.   Social History   Social History  . Marital Status: Legally Separated    Spouse Name: N/A  . Number of Children: 2  . Years of Education: N/A   Occupational History  . retired   . cattle farm     owner   Social History Main Topics  . Smoking status: Current Every Day Smoker -- 1.00 packs/day for 58 years    Types: Cigarettes  . Smokeless tobacco: Former Systems developer    Types: Chew     Comment: Pt states he's going to use the cigarettes he has today (02/07/15) and will quit  . Alcohol Use: No  . Drug Use: No  . Sexual Activity: No   Other Topics Concern  . Not on file   Social History Narrative   Patient lives at home with his spouse.   Caffeine Use: 2 cups daily         Assessment & Plan:

## 2015-06-01 NOTE — Assessment & Plan Note (Signed)
Discussed smoking cessation importance

## 2015-06-23 ENCOUNTER — Encounter: Payer: Self-pay | Admitting: Internal Medicine

## 2015-06-28 ENCOUNTER — Telehealth: Payer: Self-pay

## 2015-06-28 DIAGNOSIS — M7989 Other specified soft tissue disorders: Secondary | ICD-10-CM

## 2015-06-28 NOTE — Telephone Encounter (Signed)
Phone call from pt.  Reported that he has had continued swelling of the right foot and lower leg, up to the knee; c/o worsening over past week.  Stated that the right leg is approx. 2 x larger than the left leg.  Stated he elevates the right leg on a "special pillow", but does not see any improvement after elevating.  Stated he has pain in the right knee; reported this has been ongoing since his total knee replacement.  Denied any redness, warmth, or tenderness of the right leg.  Reported the toe amp sites have "healed-over".  Denied fever/ chills.  Reported he saw Dr. [name] recently, and was advised that the swelling was related to his circulation.  Will discuss with MD re: needed vasc. studies, and will call pt. tomorrow.  Agreed.

## 2015-06-29 NOTE — Telephone Encounter (Signed)
Discussed pt's symptoms with Dr. Oneida Alar.  Recommended to schedule for right LE Venous Duplex to r/o DVT, and office visit.  Will contact pt. with appt. information.

## 2015-06-29 NOTE — Telephone Encounter (Signed)
No available spots in the lab until 6/26, no available spots w/ the NP until Monday, no available spots with [facility] on 6/2. Spoke w/ Arbie Cookey and we agreed to sch appt w/ NP on 6/5 and she will order the test afterwards if needed.  Spoke to pt to sch appt for 6/5 at 8:45.

## 2015-06-30 ENCOUNTER — Encounter: Payer: Self-pay | Admitting: Family

## 2015-07-03 ENCOUNTER — Ambulatory Visit: Payer: Medicare Other | Admitting: Family

## 2015-07-04 ENCOUNTER — Ambulatory Visit (HOSPITAL_COMMUNITY)
Admission: RE | Admit: 2015-07-04 | Discharge: 2015-07-04 | Disposition: A | Discharge status: Home / Self Care | Payer: Medicare Other | Source: Ambulatory Visit | Attending: Family | Admitting: Family

## 2015-07-04 ENCOUNTER — Other Ambulatory Visit: Payer: Self-pay

## 2015-07-04 ENCOUNTER — Encounter: Payer: Self-pay | Admitting: Family

## 2015-07-04 ENCOUNTER — Ambulatory Visit (INDEPENDENT_AMBULATORY_CARE_PROVIDER_SITE_OTHER): Payer: Medicare Other | Admitting: Family

## 2015-07-04 VITALS — BP 129/83 | HR 103 | Temp 98.2°F | Ht 73.0 in | Wt 202.3 lb

## 2015-07-04 DIAGNOSIS — N183 Chronic kidney disease, stage 3 (moderate): Secondary | ICD-10-CM | POA: Insufficient documentation

## 2015-07-04 DIAGNOSIS — F17200 Nicotine dependence, unspecified, uncomplicated: Secondary | ICD-10-CM

## 2015-07-04 DIAGNOSIS — Z720 Tobacco use: Secondary | ICD-10-CM | POA: Insufficient documentation

## 2015-07-04 DIAGNOSIS — K219 Gastro-esophageal reflux disease without esophagitis: Secondary | ICD-10-CM | POA: Diagnosis not present

## 2015-07-04 DIAGNOSIS — Z89411 Acquired absence of right great toe: Secondary | ICD-10-CM | POA: Diagnosis not present

## 2015-07-04 DIAGNOSIS — Z95828 Presence of other vascular implants and grafts: Secondary | ICD-10-CM

## 2015-07-04 DIAGNOSIS — E1122 Type 2 diabetes mellitus with diabetic chronic kidney disease: Secondary | ICD-10-CM | POA: Diagnosis not present

## 2015-07-04 DIAGNOSIS — I1310 Hypertensive heart and chronic kidney disease without heart failure, with stage 1 through stage 4 chronic kidney disease, or unspecified chronic kidney disease: Secondary | ICD-10-CM | POA: Insufficient documentation

## 2015-07-04 DIAGNOSIS — M7989 Other specified soft tissue disorders: Secondary | ICD-10-CM | POA: Diagnosis not present

## 2015-07-04 DIAGNOSIS — E1151 Type 2 diabetes mellitus with diabetic peripheral angiopathy without gangrene: Secondary | ICD-10-CM

## 2015-07-04 DIAGNOSIS — S98111A Complete traumatic amputation of right great toe, initial encounter: Secondary | ICD-10-CM

## 2015-07-04 DIAGNOSIS — F329 Major depressive disorder, single episode, unspecified: Secondary | ICD-10-CM | POA: Diagnosis not present

## 2015-07-04 DIAGNOSIS — E7800 Pure hypercholesterolemia, unspecified: Secondary | ICD-10-CM | POA: Insufficient documentation

## 2015-07-04 DIAGNOSIS — I779 Disorder of arteries and arterioles, unspecified: Secondary | ICD-10-CM

## 2015-07-04 NOTE — Progress Notes (Signed)
[facility] [place]   CC: Follow up peripheral artery occlusive disease  History of Present Illness Marc [name] is a 75 y.o. male  patient of Dr. Scot Dock who was admitted to the hospital on 03/01/2015 for hypotension and we were called to re evaluate his right foot wound. This is a 75 year old gentleman who presented with an extensive wound of his right great toe involving the metatarsal head. He underwent a right femoral to tibial peroneal trunk bypass with a vein graft, endarterectomy of the tibial peroneal trunk with vein patch angioplasty on 02/07/15. 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 He has had some bleeding issues with this. He has had some swelling in the right leg and when he was seen in the office on 02/22/2015, he had a venous duplex scan which showed no evidence of DVT. He had excellent Doppler signals in his right foot with a patent bypass graft.  He is s/p right foot excisional debridement of right great toe amputation and second toe Ray amputation on 03/21/2015 by Dr. Scot Dock.  Pt returns today with c/o swelling in right lower leg and foot that started about 2 weeks ago. He denies fever or chills, states he has been elevating his right leg. He has 2+ pitting edema in right lower leg and right foot. His right foot amputation sites are well healed. Pt does not complain of pain in his right leg or foot.   On 04/14/15 he underwent radical irrigation and debridement of infected total knee arthroplasty with revision of DePuy tibial component rotating platform #5, 10 mm bearing by Dr. [name].  Up until 2 weeks ago he was receiving IV antibiotics, is now on oral antibiotics.   Other medical problems include has Hypertensive heart disease; Lumbar disc disease; Hx of peptic ulcer; Hx of adenomatous colonic polyps; Bradycardia: Normal condition for this patient. Previously worked up.; Tobacco abuse disorder; Spinal stenosis of  lumbar region; Type 2 diabetes mellitus with peripheral neuropathy ([place]); Gout; Hyperlipidemia; GERD (gastroesophageal reflux disease); Peripheral vascular disease with claudication ([place]); Chronic kidney disease stage 3; Mass of lower lobe of left lung; CAD (coronary artery disease), native coronary artery; Ascending aortic aneurysm ([place]); Cellulitis; Acute renal failure superimposed on stage 3 chronic kidney disease (HCC); SIRS (systemic inflammatory response syndrome) ([place]); Acute on chronic kidney failure ([place]); Type 2 diabetes mellitus with right diabetic foot infection ([place]); Atherosclerosis of native arteries of the extremities with gangrene ([place]); Extremity atherosclerosis with gangrene ([place]); Atherosclerosis of native arteries of extremities with gangrene, right leg ([place]); Hypotension; Renal failure (ARF), acute on chronic ([place]); Chronic anemia; and Type 2 diabetes mellitus with renal complication (HCC) on his problem list.   Pt Diabetic: Yes, states his last A1C was 6.8, his A1C on 03/19/15 was 7.6 (improved) Pt smoker: smoker  (1.5 ppd x started at age 36 yrs)  Pt meds include: Statin :Yes ASA: Yes Other anticoagulants/antiplatelets: Plavix   Past Medical History  Diagnosis Date  . Hypertension   . Ulcer disease   . Colon polyp   . Gout   . Bradycardia   . Hypercholesteremia   . PAD (peripheral artery disease) ([place])   . Chronic bronchitis ([place])   . Type II diabetes mellitus ([employer])   . History of GI bleed 2007  . Gout   . Tobacco abuse disorder 03/10/2012  . GERD (gastroesophageal reflux disease)   . Pneumonia     denies  . ARF (acute  renal failure) ([place]) 12/2014  . Neuropathy ([place])   . Arthritis   . Anemia     low iron  . Depression     occassional.  Situational  . History of blood transfusion   . CKD (chronic kidney disease), stage III     Social History Social History  Substance Use Topics  . Smoking status: Current Every Day Smoker -- 1.00 packs/day for 58 years     Types: Cigarettes  . Smokeless tobacco: Former Systems developer    Types: Chew     Comment: Pt states he's going to use the cigarettes he has today (02/07/15) and will quit  . Alcohol Use: No    Family History Family History  Problem Relation Age of Onset  . Colon cancer Paternal Uncle     Uncle  . Heart disease Father   . Hypertension Father   . Heart attack Father   . Heart disease Mother   . Diabetes Mother   . Hypertension Mother     Past Surgical History  Procedure Laterality Date  . Splenectomy  ~ 1957  . Anterior cervical decomp/discectomy fusion  ~ 2007  . Upper endoscopy w/ sclerotherapy  ~ 2007  . Polypectomy    . Colonoscopy    . Iliac artery stent Left     left common/notes (02/04/2012)  . Hernia repair  ~ 2007    UHR (02/04/2012)  . Tonsillectomy  ~ 1947  . Lumbar laminectomy  11/14  . Total knee arthroplasty Right 02/03/2013    Procedure: TOTAL KNEE ARTHROPLASTY;  Surgeon: [name], MD;  Location: [place];  Service: Orthopedics;  Laterality: Right;  . Lower extremity angiogram N/A 02/04/2012    Procedure: LOWER EXTREMITY ANGIOGRAM;  Surgeon: Lorretta Harp, MD;  Location: [facility] CATH LAB;  Service: Cardiovascular;  Laterality: N/A;  . Percutaneous stent intervention Left 02/04/2012    Procedure: PERCUTANEOUS STENT INTERVENTION;  Surgeon: Lorretta Harp, MD;  Location: [facility] CATH LAB;  Service: Cardiovascular;  Laterality: Left;  lt ext iliac stent  . Replacement total knee Right   . Back surgery    . Anterior lat lumbar fusion Left 01/19/2014    Procedure: LATERAL INTERBODY FUSION 1 LEVEL;  Surgeon: Sinclair Ship, MD;  Location: [place];  Service: Orthopedics;  Laterality: Left;  Left lumbar 3-4 lateral interbody fusion with instrumentation, allograft  . Peripheral vascular catheterization N/A 02/02/2015    Procedure: Abdominal Aortogram w/Lower Extremity;  Surgeon: Angelia [name], MD;  Location: [place] CV LAB;  Service: Cardiovascular;  Laterality: N/A;  . Eye  surgery Bilateral     cataract surgery with lens implant  . Bypass graft femoral-peroneal Right 02/07/2015    Procedure: BYPASS GRAFT FEMORAL-PERONEAL Trunk WITH VEIN graft  right leg.;  Surgeon: Angelia [name], MD;  Location: [place];  Service: Vascular;  Laterality: Right;  . Amputation Right 02/07/2015    Procedure: GREAT TOE RAY AMPUTATION RIGHT ;  Surgeon: Angelia [name], MD;  Location: [place];  Service: Vascular;  Laterality: Right;  . Application of wound vac Right 02/07/2015    Procedure:  APPLICATION OF WOUND VAC right great toe amputation site.;  Surgeon: Angelia [name], MD;  Location: [place];  Service: Vascular;  Laterality: Right;  . Intraoperative arteriogram Right 02/07/2015    Procedure: INTRA OPERATIVE ARTERIOGRAM;  Surgeon: Angelia [name], MD;  Location: [place];  Service: Vascular;  Laterality: Right;  . Endarterectomy tibioperoneal Right 02/07/2015    Procedure: ENDARTERECTOMY  TIBIOPERONEAL;  Surgeon: Angelia [name], MD;  [facility];  Service: Vascular;  Laterality: Right;  . Patch angioplasty Right 02/07/2015    Procedure: Vein PATCH ANGIOPLASTY to tibioperoneal trunk;  Surgeon: Angelia [name], MD;  Location: [place];  Service: Vascular;  Laterality: Right;  . Wound debridement Right 03/21/2015    Procedure: DEBRIDEMENT WOUND of RIGHT FOOT;  Surgeon: Angelia [name], MD;  Location: [place];  Service: Vascular;  Laterality: Right;  . Application of wound vac Right 03/21/2015    Procedure: APPLICATION OF WOUND VAC RIGHT FOOT;  Surgeon: Angelia [name], MD;  Location: [place];  Service: Vascular;  Laterality: Right;  . Amputation toe Right 03/21/2015    Procedure: AMPUTATION TOE-RIGHT SECOND TOE;  Surgeon: Angelia [name], MD;  Location: [place];  Service: Vascular;  Laterality: Right;  . Excisional total knee arthroplasty with antibiotic spacers Right 04/14/2015    Procedure: RIGHT KNEE [place];  Surgeon: Frederik Pear, MD;  Location: [place];  Service: Orthopedics;  Laterality: Right;  . Irrigation and debridement knee Right 04/14/2015    Procedure: IRRIGATION AND DEBRIDEMENT KNEE;  Surgeon: Frederik Pear, MD;  Location: [place];  Service: Orthopedics;  Laterality: Right;    No Known Allergies  Current Outpatient Prescriptions  Medication Sig Dispense Refill  . acetaminophen (TYLENOL) 500 MG tablet Take 1,000 mg by mouth every 6 (six) hours as needed for fever. Reported on 06/01/2015    . allopurinol (ZYLOPRIM) 300 MG tablet Take 300 mg by mouth 2 (two) times daily.     [name] amLODipine (NORVASC) 10 MG tablet Take 1 tablet (10 mg total) by mouth daily. 30 tablet 0  . aspirin EC 325 MG tablet Take 1 tablet (325 mg total) by mouth 2 (two) times daily. 30 tablet 0  . atorvastatin (LIPITOR) 10 MG tablet Take 10 mg by mouth at bedtime.     . cefUROXime (CEFTIN) 500 MG tablet Take 1 tablet (500 mg total) by mouth 2 (two) times daily with a meal. 60 tablet 4  . citalopram (CELEXA) 20 MG tablet Take 20 mg by mouth daily.    . colchicine 0.6 MG tablet Take 1 tablet (0.6 mg total) by mouth daily. (Patient taking differently: Take 0.6 mg by mouth at bedtime. ) 10 tablet 0  . doxycycline (VIBRA-TABS) 100 MG tablet Take 1 tablet (100 mg total) by mouth 2 (two) times daily. 60 tablet 4  . furosemide (LASIX) 80 MG tablet Take 0.5 tablets (40 mg total) by mouth daily.    [name] gabapentin (NEURONTIN) 600 MG tablet Take 1,200 mg by mouth 2 (two) times daily.     [name] glimepiride (AMARYL) 4 MG tablet Take 4 mg by mouth 2 (two) times daily.     [name] oxyCODONE-acetaminophen (ROXICET) 5-325 MG tablet Take 1 tablet by mouth every 4 (four) hours as needed. 60 tablet 0  . pioglitazone (ACTOS) 30 MG tablet Take 30 mg by mouth daily.    . potassium gluconate 595 MG TABS tablet Take 595 mg by mouth at bedtime.     . predniSONE (DELTASONE) 10 MG tablet Take 10 mg by mouth daily with breakfast.     . collagenase (SANTYL) ointment Apply 1 application  topically every other day. Reported on 07/04/2015    . sodium chloride 0.9 % infusion Inject 10 mLs into the vein once. (Patient not taking: Reported on 06/01/2015) 10 mL 0  . tizanidine (ZANAFLEX) 2 MG capsule Take 1  capsule (2 mg total) by mouth 3 (three) times daily. (Patient not taking: Reported on 07/04/2015) 60 capsule 0   No current facility-administered medications for this visit.   Facility-Administered Medications Ordered in Other Visits  Medication Dose Route Frequency Provider Last Rate Last Dose  . 0.9 %  sodium chloride infusion   Intravenous Continuous Angelia [name], MD      . Chlorhexidine Gluconate Cloth 2 % PADS 6 each  6 each Topical Once Angelia [name], MD        ROS: See HPI for pertinent positives and negatives.   Physical Examination  Filed Vitals:   07/04/15 1332  BP: 129/83  Pulse: 103  Temp: 98.2 F (36.8 C)  TempSrc: Oral  Height: 6\' 1"  (1.854 m)  Weight: 202 lb 4.8 oz (91.763 kg)  SpO2: 95%   Body mass index is 26.7 kg/(m^2).  General: A&O x 3, WDWN, male, afebrile. Gait: using cane, slow and deliberate Eyes: PERRLA. Pulmonary: Respirations are non labored Cardiac: regular rhythm, slightly tachycardic at 102     Aorta is not palpable. Radial pulses: are 2+ palpable and =                           VASCULAR EXAM: Extremities without ischemic changes, without Gangrene; without open wounds. Right lower leg and foot with 2+ pitting edema. Right foot great toe and second toe amputation sites are well healed.                                                                                                           LE Pulses Right Left       FEMORAL  not palpable  not palpable        POPLITEAL  not palpable   not palpable       POSTERIOR TIBIAL  not palpable, brisk Doppler signal   not palpable, audible Doppler signal        DORSALIS PEDIS      ANTERIOR TIBIAL not palpable, brisk Doppler signal  not palpable, audible Doppler signal     Abdomen: soft, NT, no palpable masses. Skin: no rashes, no ulcers Musculoskeletal: no muscle wasting or atrophy. See Extremities.   Neurologic: A&O X 3; Appropriate Affect, sensation is normal,  MOTOR FUNCTION:  moving all extremities equally, motor strength 5/5 throughout except 4/5 right LE. Speech is fluent/normal.  CN 2-12 are grossly intact.    Non-Invasive Vascular Imaging: DATE: 07/04/2015  Right Lower Extremity Venous Duplex: No evidence of DVT. Interstitial fluid in the right calf.   ASSESSMENT: Marc [name] is a 75 y.o. male who presents with: a 2 week history of right lower leg and foot swelling.  He is s/p  ght femoral to tibial peroneal trunk bypass with a vein graft, endarterectomy of the tibial peroneal trunk with vein patch angioplasty on 02/07/15. 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 He is also s/p radical irrigation and debridement of infected total knee arthroplasty with revision of DePuy tibial component rotating platform #5, 10 mm bearing by Dr. [name] on 04/14/15.  Up until 2 weeks ago he was receiving IV antibiotics, is now on oral antibiotics.  Pt has no fever or chills. His right foot amputation sites are well healed. He has brisk Doppler signals at his right DP and PT pulses, he has no signs of ischemia in his right foot/leg. He has 2+ pitting edema in his right lower leg and foot which he states started about 2 weeks ago. The last vascular procedure to his right leg was over 3 months ago and the swelling in his right lower leg started 2 weeks ago. He states that he has been elevating his right leg but it does not appear that he is elevating adequately to minimize venous insufficiency. Venous Duplex today indicates no evidence of DVT in the right leg, does indicate interstitial fluid in the right calf.  It  appears that pt is not elevating his right leg adequately and he was instructed how to accomplish this: elevate his right foot above his right knee, right knee slightly flexed, and right foot above his heart overnight and for 20 minutes, 3-4 times/day.  His atherosclerotic risk factors include recently in control DM, continued heavy smoking, and stage 3 CKD.  The patient was counseled re smoking cessation and given several free resources re smoking cessation.  Face to face time with patient was 25 minutes. Over 50% of this time was spent on counseling and coordination of care.   PLAN:  Based on the patient's vascular studies and examination, pt will return to clinic in 2 weeks for right lower leg evaluation by Dr. Scot Dock.  I discussed in depth with the patient the nature of atherosclerosis, and emphasized the importance of maximal medical management including strict control of blood pressure, blood glucose, and lipid levels, obtaining regular exercise, and cessation of smoking.  The patient is aware that without maximal medical management the underlying atherosclerotic disease process will progress, limiting the benefit of any interventions.  The patient was given information about PAD including signs, symptoms, treatment, what symptoms should prompt the patient to seek immediate medical care, and risk reduction measures to take.  Clemon Chambers, RN, MSN, FNP-C [facility] Phone: [phone]  Clinic MD: Trula Slade on call  07/04/2015 1:52 PM

## 2015-07-10 ENCOUNTER — Encounter: Payer: Self-pay | Admitting: Vascular Surgery

## 2015-07-19 ENCOUNTER — Encounter: Payer: Self-pay | Admitting: Vascular Surgery

## 2015-07-19 ENCOUNTER — Ambulatory Visit (INDEPENDENT_AMBULATORY_CARE_PROVIDER_SITE_OTHER): Payer: Medicare Other | Admitting: Vascular Surgery

## 2015-07-19 VITALS — BP 177/76 | HR 57 | Temp 97.9°F | Resp 18 | Ht 74.0 in | Wt 199.0 lb

## 2015-07-19 DIAGNOSIS — I70261 Atherosclerosis of native arteries of extremities with gangrene, right leg: Secondary | ICD-10-CM

## 2015-07-19 NOTE — Progress Notes (Signed)
Filed Vitals:   07/19/15 0959 07/19/15 1002  BP: 174/75 177/76  Pulse: 57 57  Temp: 97.9 F (36.6 C)   Resp: 18   Height: 6\' 2"  (1.88 m)   Weight: 199 lb (90.266 kg)   SpO2: 98%

## 2015-07-19 NOTE — Progress Notes (Signed)
[facility] [place]  Patient name: Marc [name] MRN: [id number] DOB: March 05, 1940 Sex: male  REASON FOR VISIT: follow up of bypass right lower extremity and right great toe amputation site  HPI: Marc [name] is a 75 y.o. male who had undergone right lower extremity revascularization and ray amputation of the right great toe.  (he had a right common femoral artery to tibial peroneal trunk bypass with a vein graft and also endarterectomy of the tibial peroneal trunk with vein patch angioplasty on 02/07/2015.) He required debridement in the operating room on 03/21/2015. Did excisional debridement of the right great toe with Raney amputation of the right second toe and placement of a negative pressure dressing. He later underwent orthopedic surgery in the right lower extremity for washout of the right knee.   Dr. [name] has been following the right knee. He is on doxycycline. He denies fever or chills.  His only complaint is some swelling in the right leg. He denies significant rest pain.  Past Medical History  Diagnosis Date  . Hypertension   . Ulcer disease   . Colon polyp   . Gout   . Bradycardia   . Hypercholesteremia   . PAD (peripheral artery disease) ([place])   . Chronic bronchitis ([place])   . Type II diabetes mellitus ([place])   . History of GI bleed 2007  . Gout   . Tobacco abuse disorder 03/10/2012  . GERD (gastroesophageal reflux disease)   . Pneumonia     denies  . ARF (acute renal failure) ([place]) 12/2014  . Neuropathy ([place])   . Arthritis   . Anemia     low iron  . Depression     occassional.  Situational  . History of blood transfusion   . CKD (chronic kidney disease), stage III     Family History  Problem Relation Age of Onset  . Colon cancer Paternal Uncle     Uncle  . Heart disease Father   . Hypertension Father   . Heart attack Father   . Heart disease Mother   . Diabetes Mother   . Hypertension Mother     SOCIAL HISTORY: Social  History  Substance Use Topics  . Smoking status: Current Every Day Smoker -- 1.00 packs/day for 58 years    Types: Cigarettes  . Smokeless tobacco: Former Systems developer    Types: Chew     Comment: Pt states he's going to use the cigarettes he has today (02/07/15) and will quit  . Alcohol Use: No    No Known Allergies  Current Outpatient Prescriptions  Medication Sig Dispense Refill  . acetaminophen (TYLENOL) 500 MG tablet Take 1,000 mg by mouth every 6 (six) hours as needed for fever. Reported on 06/01/2015    . allopurinol (ZYLOPRIM) 300 MG tablet Take 300 mg by mouth 2 (two) times daily.     [name] amLODipine (NORVASC) 10 MG tablet Take 1 tablet (10 mg total) by mouth daily. 30 tablet 0  . aspirin EC 325 MG tablet Take 1 tablet (325 mg total) by mouth 2 (two) times daily. 30 tablet 0  . atorvastatin (LIPITOR) 10 MG tablet Take 10 mg by mouth at bedtime.     . cefUROXime (CEFTIN) 500 MG tablet Take 1 tablet (500 mg total) by mouth 2 (two) times daily with a meal. 60 tablet 4  . citalopram (CELEXA) 20 MG tablet Take 20 mg by mouth daily.    . colchicine 0.6 MG tablet Take  1 tablet (0.6 mg total) by mouth daily. (Patient taking differently: Take 0.6 mg by mouth at bedtime. ) 10 tablet 0  . doxycycline (VIBRA-TABS) 100 MG tablet Take 1 tablet (100 mg total) by mouth 2 (two) times daily. 60 tablet 4  . furosemide (LASIX) 80 MG tablet Take 0.5 tablets (40 mg total) by mouth daily.    [name] gabapentin (NEURONTIN) 600 MG tablet Take 1,200 mg by mouth 2 (two) times daily.     [name] glimepiride (AMARYL) 4 MG tablet Take 4 mg by mouth 2 (two) times daily.     . pioglitazone (ACTOS) 30 MG tablet Take 30 mg by mouth daily.    . potassium gluconate 595 MG TABS tablet Take 595 mg by mouth at bedtime.     . predniSONE (DELTASONE) 10 MG tablet Take 10 mg by mouth daily with breakfast.     . collagenase (SANTYL) ointment Apply 1 application topically every other day. Reported on 07/19/2015    . oxyCODONE-acetaminophen (ROXICET)  5-325 MG tablet Take 1 tablet by mouth every 4 (four) hours as needed. (Patient not taking: Reported on 07/19/2015) 60 tablet 0  . sodium chloride 0.9 % infusion Inject 10 mLs into the vein once. (Patient not taking: Reported on 07/19/2015) 10 mL 0  . tizanidine (ZANAFLEX) 2 MG capsule Take 1 capsule (2 mg total) by mouth 3 (three) times daily. (Patient not taking: Reported on 07/19/2015) 60 capsule 0   No current facility-administered medications for this visit.   Facility-Administered Medications Ordered in Other Visits  Medication Dose Route Frequency Provider Last Rate Last Dose  . 0.9 %  sodium chloride infusion   Intravenous Continuous Angelia [name], MD      . Chlorhexidine Gluconate Cloth 2 % PADS 6 each  6 each Topical Once Angelia [name], MD        REVIEW OF SYSTEMS:  [X]  denotes positive finding, [ ]  denotes negative finding Cardiac  Comments:  Chest pain or chest pressure:    Shortness of breath upon exertion:    Short of breath when lying flat:    Irregular heart rhythm:        Vascular    Pain in calf, thigh, or hip brought on by ambulation:    Pain in feet at night that wakes you up from your sleep:     Blood clot in your veins:    Leg swelling:         Pulmonary    Oxygen at home:    Productive cough:     Wheezing:         Neurologic    Sudden weakness in arms or legs:     Sudden numbness in arms or legs:     Sudden onset of difficulty speaking or slurred speech:    Temporary loss of vision in one eye:     Problems with dizziness:         Gastrointestinal    Blood in stool:     Vomited blood:         Genitourinary    Burning when urinating:     Blood in urine:        Psychiatric    Major depression:         Hematologic    Bleeding problems:    Problems with blood clotting too easily:        Skin    Rashes or ulcers:        Constitutional  Fever or chills:      PHYSICAL EXAM: Filed Vitals:   07/19/15 0959 07/19/15 1002  BP:  174/75 177/76  Pulse: 57 57  Temp: 97.9 F (36.6 C)   Resp: 18   Height: 6\' 2"  (1.88 m)   Weight: 199 lb (90.266 kg)   SpO2: 98%     GENERAL: The patient is a well-nourished male, in no acute distress. The vital signs are documented above. CARDIAC: There is a regular rate and rhythm.  VASCULAR: he has a palpable femoral pulse and popliteal pulse on the right. He has a biphasic posterior tibial signal with the Doppler and a monophasic dorsalis pedis signal on the right. PULMONARY: There is good air exchange bilaterally without wheezing or rales. ABDOMEN: Soft and non-tender with normal pitched bowel sounds.  MUSCULOSKELETAL: the amputation site on his right foot has healed. NEUROLOGIC: No focal weakness or paresthesias are detected. SKIN: There are no ulcers or rashes noted. PSYCHIATRIC: The patient has a normal affect.  MEDICAL ISSUES:  STATUS POST RIGHT LOWER EXTREMITY REVASCULARIZATION AND AMPUTATION OF THE RIGHT FIRST AND SECOND TOES: His bypass graft is working well and the amputation site on his foot has healed. He does have some right leg swelling and we've discussed the importance of intermittent leg elevation for this. I'll see him back in 3 months for a follow up of his bypass graft. He'll get a duplex of the graft and ABIs at that time. He is to call sooner if he has problems.  HYPERTENSION: The patient's initial blood pressure today was elevated. We repeated this and this was still elevated. We have encouraged the patient to follow up with their primary care physician for management of their blood pressure.  Deitra Mayo [facility] [place] [phone]

## 2015-07-26 ENCOUNTER — Other Ambulatory Visit: Payer: Self-pay | Admitting: Cardiothoracic Surgery

## 2015-07-26 DIAGNOSIS — R918 Other nonspecific abnormal finding of lung field: Secondary | ICD-10-CM

## 2015-08-02 ENCOUNTER — Ambulatory Visit: Payer: Medicare Other | Admitting: Internal Medicine

## 2015-08-09 ENCOUNTER — Ambulatory Visit: Payer: Medicare Other | Admitting: Internal Medicine

## 2015-08-14 ENCOUNTER — Encounter: Payer: Self-pay | Admitting: Cardiothoracic Surgery

## 2015-08-14 ENCOUNTER — Ambulatory Visit (INDEPENDENT_AMBULATORY_CARE_PROVIDER_SITE_OTHER): Payer: Medicare Other | Admitting: Cardiothoracic Surgery

## 2015-08-14 ENCOUNTER — Ambulatory Visit
Admission: RE | Admit: 2015-08-14 | Discharge: 2015-08-14 | Disposition: A | Discharge status: Home / Self Care | Payer: Medicare Other | Source: Ambulatory Visit | Attending: Cardiothoracic Surgery | Admitting: Cardiothoracic Surgery

## 2015-08-14 VITALS — BP 140/70 | HR 72 | Resp 16 | Ht 74.0 in | Wt 200.0 lb

## 2015-08-14 DIAGNOSIS — I712 Thoracic aortic aneurysm, without rupture, unspecified: Secondary | ICD-10-CM

## 2015-08-14 DIAGNOSIS — D381 Neoplasm of uncertain behavior of trachea, bronchus and lung: Secondary | ICD-10-CM

## 2015-08-14 DIAGNOSIS — R918 Other nonspecific abnormal finding of lung field: Secondary | ICD-10-CM

## 2015-08-14 NOTE — Progress Notes (Signed)
[address]       [place] [address]             [place] Record [id number] Date of Birth: 1940/08/02  Referring: [name], * Primary Care: [name], MD  Chief Complaint:    Chief Complaint  Patient presents with  . Lung Lesion    8 month f/u on left lung lesion and dilated ascending aorta with Chest CT  . Thoracic Aortic Aneurysm    History of Present Illness:    Marc [name] 75 y.o. male is seen in the office  8 months ago for abnormal screening CT scan of the chest. The patient has a long history of smoking since age 89 up to 2 packs a day for more than 30 years. He has known peripheral vascular disease, history of abnormal EKG but no known acute myocardial infarction. He is limited in his physical ability because of chronic back next in knee pain, status post back surgery 3 in right knee replacement. He has known peripheral vascular disease having stents placed in right and left iliac arteries by Dr. Gwenlyn Found. He complains of chronic leg pain bilateral and numbness in his right foot.  Since last seen in the office in November 2016 8 months ago the patient has been hospital 84 right leg and foot ischemic problems. He had an angioplasty on the right leg and toe amputation, then subsequent vascular bypass to the right leg developed infections in his feet and ultimately had his right artificial knee joint drain because of infection.  He continues to have problems with his right leg as far as ambulation and edema .   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      NEEDLE BIOPSY  OF LEFT LUNG LESION HAS BEEN DONEHAS BEEN DONE _  Diagnosis Lung, needle/core biopsy(ies), left lower lobe - BENIGN  LUNG PARENCHYMA WITH MILD INFLAMMATION AND INTERSTITIAL FIBROSIS. - THERE IS NO EVIDENCE OF GRANULOMATA OR MALIGNANCY. Enid Cutter MD     Current Activity/ Functional Status:  Patient is independent with mobility/ambulation, transfers, ADL's, IADL's. patient does have difficulty with ambulation frequently uses a cane when out in public because of leg weakness right greater than left. He notes that he is not as active as he has been over the years, stays at home and watches TV a lot   Zubrod Score: At the time of surgery this patient's most appropriate activity status/level should be described as: []     0    Normal activity, no symptoms [x]     1    Restricted in physical strenuous activity but ambulatory, able to do out light work []     2    Ambulatory and capable of self care, unable to do work activities, up and about               >50 % of waking hours                              []     3    Only limited self care, in bed greater than 50% of  waking hours []     4    Completely disabled, no self care, confined to bed or chair []     5    Moribund   Past Medical History  Diagnosis Date  . Hypertension   . Ulcer disease   . Colon polyp   . Gout   . Bradycardia   . Hypercholesteremia   . PAD (peripheral artery disease) ([place])   . Chronic bronchitis ([place])   . Type II diabetes mellitus ([place])   . History of GI bleed 2007  . Gout   . Tobacco abuse disorder 03/10/2012  . GERD (gastroesophageal reflux disease)   . Pneumonia     denies  . ARF (acute renal failure) ([facility]) 12/2014  . Neuropathy ([place])   . Arthritis   . Anemia     low iron  . Depression     occassional.  Situational  . History of blood transfusion   . CKD (chronic kidney disease), stage III     Past Surgical History  Procedure Laterality Date  . Splenectomy  ~ 1957  . Anterior cervical decomp/discectomy fusion  ~ 2007  . Upper endoscopy w/ sclerotherapy  ~ 2007  . Polypectomy    . Colonoscopy    . Iliac artery  stent Left     left common/notes (02/04/2012)  . Hernia repair  ~ 2007    UHR (02/04/2012)  . Tonsillectomy  ~ 1947  . Lumbar laminectomy  11/14  . Total knee arthroplasty Right 02/03/2013    Procedure: TOTAL KNEE ARTHROPLASTY;  Surgeon: [name], MD;  Location: [facility];  Service: Orthopedics;  Laterality: Right;  . Lower extremity angiogram N/A 02/04/2012    Procedure: LOWER EXTREMITY ANGIOGRAM;  Surgeon: Lorretta Harp, MD;  Location: [facility] CATH LAB;  Service: Cardiovascular;  Laterality: N/A;  . Percutaneous stent intervention Left 02/04/2012    Procedure: PERCUTANEOUS STENT INTERVENTION;  Surgeon: Lorretta Harp, MD;  Location: [facility] CATH LAB;  Service: Cardiovascular;  Laterality: Left;  lt ext iliac stent  . Replacement total knee Right   . Back surgery    . Anterior lat lumbar fusion Left 01/19/2014    Procedure: LATERAL INTERBODY FUSION 1 LEVEL;  Surgeon: Sinclair Ship, MD;  Location: [facility];  Service: Orthopedics;  Laterality: Left;  Left lumbar 3-4 lateral interbody fusion with instrumentation, allograft  . Peripheral vascular catheterization N/A 02/02/2015    Procedure: Abdominal Aortogram w/Lower Extremity;  Surgeon: Angelia [name], MD;  Location: [facility] CV LAB;  Service: Cardiovascular;  Laterality: N/A;  . Eye surgery Bilateral     cataract surgery with lens implant  . Bypass graft femoral-peroneal Right 02/07/2015    Procedure: BYPASS GRAFT FEMORAL-PERONEAL Trunk WITH VEIN graft  right leg.;  Surgeon: Angelia [name], MD;  Location: [facility];  Service: Vascular;  Laterality: Right;  . Amputation Right 02/07/2015    Procedure: GREAT TOE RAY AMPUTATION RIGHT ;  Surgeon: Angelia [name], MD;  Location: [facility];  Service: Vascular;  Laterality: Right;  . Application of wound vac Right 02/07/2015    Procedure:  APPLICATION OF WOUND VAC right great toe amputation site.;  Surgeon: Angelia [name], MD;  Location: [facility];  Service: Vascular;  Laterality: Right;  .  Intraoperative arteriogram Right 02/07/2015    Procedure: INTRA OPERATIVE ARTERIOGRAM;  Surgeon: Angelia [name], MD;  Location: [facility];  Service: Vascular;  Laterality: Right;  . Endarterectomy tibioperoneal Right 02/07/2015    Procedure: ENDARTERECTOMY TIBIOPERONEAL;  Surgeon: Angelia [name], MD;  Location: [facility] OR;  Service: Vascular;  Laterality: Right;  . Patch angioplasty Right 02/07/2015    Procedure: Vein PATCH ANGIOPLASTY to tibioperoneal trunk;  Surgeon: Angelia [name], MD;  Location: [facility];  Service: Vascular;  Laterality: Right;  . Wound debridement Right 03/21/2015    Procedure: DEBRIDEMENT WOUND of RIGHT FOOT;  Surgeon: Angelia [name], MD;  Location: [facility];  Service: Vascular;  Laterality: Right;  . Application of wound vac Right 03/21/2015    Procedure: APPLICATION OF WOUND VAC RIGHT FOOT;  Surgeon: Angelia [name], MD;  Location: [facility];  Service: Vascular;  Laterality: Right;  . Amputation toe Right 03/21/2015    Procedure: AMPUTATION TOE-RIGHT SECOND TOE;  Surgeon: Angelia [name], MD;  Location: [facility];  Service: Vascular;  Laterality: Right;  . Excisional total knee arthroplasty with antibiotic spacers Right 04/14/2015    Procedure: RIGHT KNEE [place];  Surgeon: Frederik Pear, MD;  Location: [facility];  Service: Orthopedics;  Laterality: Right;  . Irrigation and debridement knee Right 04/14/2015    Procedure: IRRIGATION AND DEBRIDEMENT KNEE;  Surgeon: Frederik Pear, MD;  Location: [facility];  Service: Orthopedics;  Laterality: Right;    Family History  Problem Relation Age of Onset  . Colon cancer Paternal Uncle     Uncle  . Heart disease Father   . Hypertension Father   . Heart attack Father   . Heart disease Mother   . Diabetes Mother   . Hypertension Mother     Social History   Social History  . Marital Status: Legally Separated    Spouse Name: N/A  . Number of Children: 2  . Years of Education: N/A   Occupational  History  . retired   . cattle farm     owner   Social History Main Topics  . Smoking status: Current Every Day Smoker -- 1.00 packs/day for 58 years    Types: Cigarettes  . Smokeless tobacco: Former Systems developer    Types: Chew     Comment: Pt states he's going to use the cigarettes he has today (02/07/15) and will quit  . Alcohol Use: No  . Drug Use: No  . Sexual Activity: No   Other Topics Concern  . Not on file   Social History Narrative   Patient lives at home with his spouse.   Caffeine Use: 2 cups daily    History  Smoking status  . Current Every Day Smoker -- 1.00 packs/day for 58 years  . Types: Cigarettes  Smokeless tobacco  . Former Systems developer  . Types: Chew    Comment: Pt states he's going to use the cigarettes he has today (02/07/15) and will quit    History  Alcohol Use No     No Known Allergies  Current Outpatient Prescriptions  Medication Sig Dispense Refill  . acetaminophen (TYLENOL) 500 MG tablet Take 1,000 mg by mouth every 6 (six) hours as needed for fever. Reported on 06/01/2015    . allopurinol (ZYLOPRIM) 300 MG tablet Take 300 mg by mouth 2 (two) times daily.     [name] amLODipine (NORVASC) 10 MG tablet Take 1 tablet (10 mg total) by mouth daily. 30 tablet 0  . aspirin EC 325 MG tablet Take 1 tablet (325 mg total) by mouth 2 (two) times daily. 30 tablet 0  . atorvastatin (LIPITOR) 10 MG tablet Take 10 mg by mouth at bedtime.     [name]  cefUROXime (CEFTIN) 500 MG tablet Take 1 tablet (500 mg total) by mouth 2 (two) times daily with a meal. 60 tablet 4  . citalopram (CELEXA) 20 MG tablet Take 20 mg by mouth daily.    . colchicine 0.6 MG tablet Take 1 tablet (0.6 mg total) by mouth daily. (Patient taking differently: Take 0.6 mg by mouth at bedtime. ) 10 tablet 0  . collagenase (SANTYL) ointment Apply 1 application topically every other day. Reported on 07/19/2015    . doxycycline (VIBRA-TABS) 100 MG tablet Take 1 tablet (100 mg total) by mouth 2 (two) times daily. 60 tablet 4    . furosemide (LASIX) 80 MG tablet Take 0.5 tablets (40 mg total) by mouth daily.    [name] gabapentin (NEURONTIN) 600 MG tablet Take 1,200 mg by mouth 2 (two) times daily.     [name] glimepiride (AMARYL) 4 MG tablet Take 4 mg by mouth 2 (two) times daily.     [name] oxyCODONE-acetaminophen (ROXICET) 5-325 MG tablet Take 1 tablet by mouth every 4 (four) hours as needed. (Patient not taking: Reported on 07/19/2015) 60 tablet 0  . pioglitazone (ACTOS) 30 MG tablet Take 30 mg by mouth daily.    . potassium gluconate 595 MG TABS tablet Take 595 mg by mouth at bedtime.     . predniSONE (DELTASONE) 10 MG tablet Take 10 mg by mouth daily with breakfast.     . sodium chloride 0.9 % infusion Inject 10 mLs into the vein once. (Patient not taking: Reported on 07/19/2015) 10 mL 0  . tizanidine (ZANAFLEX) 2 MG capsule Take 1 capsule (2 mg total) by mouth 3 (three) times daily. (Patient not taking: Reported on 07/19/2015) 60 capsule 0   No current facility-administered medications for this visit.   Facility-Administered Medications Ordered in Other Visits  Medication Dose Route Frequency Provider Last Rate Last Dose  . 0.9 %  sodium chloride infusion   Intravenous Continuous Angelia [name], MD      . Chlorhexidine Gluconate Cloth 2 % PADS 6 each  6 each Topical Once Angelia [name], MD         Review of Systems:     Cardiac Review of Systems: Y or N  Chest Pain [ n   ]  Resting SOB [  n ] Exertional SOB  Blue.Reese  ]  Orthopnea [ n ]   Pedal Edema [ y  ]    Palpitations [ n ] Syncope  [ n ]   Presyncope [  n ]  General Review of Systems: [Y] = yes [  ]=no Constitional: recent weight change [ n ];  Wt loss over the last 3 months [   ] anorexia [  ]; fatigue [ y ]; nausea [n  ]; night sweats [  ]; fever [ n ]; or chills [n  ];          Dental: poor dentition[  ]; Last Dentist visit:   Eye : blurred vision [  n]; diplopia [ n  ]; vision changes [ n ];  Amaurosis fugax[  n]; Resp: cough [ n ];  wheezing[n  ];   hemoptysis[ n ]; shortness of breath[ y ]; paroxysmal nocturnal dyspnea[ n ]; dyspnea on exertion[ y ]; or orthopnea[  ];  GI:  gallstones[  ], vomiting[ n ];  dysphagia[  ]; melena[  ];  hematochezia [  ]; heartburn[  ];   Hx of  Colonoscopy[  ]; GU: kidney stones [  ];  hematuria[n  ];   dysuria [  ];  nocturia[  ];  history of     obstruction [  ]; urinary frequency [  ]             Skin: rash, swelling[  ];, hair loss[ y ];  peripheral edema[y  ];  or itching[  ]; Musculosketetal: myalgias[y  ];  joint swelling[y  ];  joint erythema[  ];  joint pain[y  ];  back pain[y  ];  Heme/Lymph: bruising[ y ];  bleeding[ n ];  anemia[ n ];  Neuro: TIA[n  ];  headaches[  ];  stroke[  ];  vertigo[  ];  seizures[ n ];   paresthesias[y  ];  difficulty walking[y  ];  Psych:depression[  ]; anxiety[  ];  Endocrine: diabetes[n  ];  thyroid dysfunction[n  ];  Immunizations: Flu up to date [ y ]; Pneumococcal up to date Blue.Reese  ];  Other:  Physical Exam: Ht 6\' 2"  (1.88 m)  Wt 200 lb (90.719 kg)  BMI 25.67 kg/m2  SpO2   PHYSICAL EXAMINATION: General appearance: alert, cooperative and appears older than stated age Head: Normocephalic, without obvious abnormality, atraumatic Neck: no adenopathy, no carotid bruit, no JVD, supple, symmetrical, trachea midline and thyroid not enlarged, symmetric, no tenderness/mass/nodules Lymph nodes: Cervical, supraclavicular, and axillary nodes normal. Resp: clear to auscultation bilaterally Back: symmetric, no curvature. ROM normal. No CVA tenderness. Cardio: regular rate and rhythm, S1, S2 normal, no murmur, click, rub or gallop GI: soft, non-tender; bowel sounds normal; no masses,  no organomegaly Extremities: Patient has large compression dressing on his right leg, right leg is more swollen than the left even with this compression stockings, he notes that this is been going on for months Neurologic: Gait: Patient has trouble ambulating especially with right leg  weakness  Diagnostic Studies & Laboratory data:     Recent Radiology Findings:  Ct Chest Wo Contrast  08/14/2015  CLINICAL DATA:  Follow-up lung mass. Negative biopsy left lung 2016. EXAM: CT CHEST WITHOUT CONTRAST TECHNIQUE: Multidetector CT imaging of the chest was performed following the standard protocol without IV contrast. COMPARISON:  CT chest dated 12/12/2014 and CT lung biopsy dated 07/26/2014. FINDINGS: Mediastinum/Lymph Nodes: Scattered atherosclerotic changes of the thoracic aorta. Stable ascending thoracic aortic aneurysm measuring 4.2 cm, unchanged. Heart size is normal. Coronary artery calcifications noted. No pericardial effusion. No masses or enlarged lymph nodes seen within the mediastinum or perihilar regions. Esophagus is unremarkable. Trachea and central bronchi are unremarkable. Lungs/Pleura: Rounded pleural based mass/consolidation within the posterior medial left lower lobe is stable, measuring 3 x 1.4 cm. Lungs are otherwise clear. No pleural effusion. Upper abdomen: Limited images of the upper abdomen are unremarkable. Musculoskeletal: Mild degenerative change again noted throughout the kyphotic thoracic spine. No acute or suspicious osseous finding. Superficial soft tissues are unremarkable. IMPRESSION: 1. Stable appearance of the rounded pleural based mass/consolidation within the posterior-medial portion of the left lower lobe, again measuring 3 x 1.4 cm, previously biopsied with benign pathology result. This has now been shown to be stable for greater than 1 year. As indicated on the previous CT, this is most likely rounded atelectasis. Would consider an additional follow-up chest CT in 12 months to ensure 2 year stability. 2. Additional 5 mm subpleural nodule in the right lower lobe (series 4, image 93) is also stable compared to the previous exams suggesting benignity. This can also be assessed for 2 year stability in 12 months. 3. Stable aneurysm  of the ascending thoracic aorta  measuring 4.2 cm. Recommend semi-annual imaging followup by CTA or MRA and referral to cardiothoracic surgery if not already obtained. This recommendation follows 2010 ACCF/AHA/AATS/ACR/ASA/SCA/SCAI/SIR/STS/SVM Guidelines for the Diagnosis and Management of Patients With Thoracic Aortic Disease. Circulation. 2010; 121: [id number] 4. Aortic atherosclerosis. Electronically Signed   By: Franki Cabot M.D.   On: 08/14/2015 14:51   Ct Chest Wo Contrast  12/12/2014  CLINICAL DATA:  Follow-up left lower lobe mass, negative biopsy, smoker EXAM: CT CHEST WITHOUT CONTRAST TECHNIQUE: Multidetector CT imaging of the chest was performed following the standard protocol without IV contrast. COMPARISON:  PET-CT dated 07/19/2014. Lung cancer screening chest CT dated 07/11/2014. FINDINGS: Mediastinum/Nodes: Heart is normal in size. No pericardial effusion. Coronary atherosclerosis. Atherosclerotic calcifications of the aortic arch. 4.2 cm ascending thoracic aortic aneurysm, unchanged. Small mediastinal lymph nodes which do not meet pathologic CT size criteria. Visualized thyroid is unremarkable. Lungs/Pleura: 1.6 x 3.0 cm rounded subpleural opacity in the posteromedial left lower lobe (series 4/ image 41), previously 1.3 x 3.0 cm, unchanged. Additional 5 mm subpleural nodule in the right lower lobe (series 4/image 46), unchanged. Underlying mild paraseptal emphysematous changes. No focal consolidation. Calcified pleural plaques (series 3/ image 22). No pleural effusion or pneumothorax. Upper abdomen: Visualized upper abdomen is notable for vascular calcifications. Musculoskeletal: Degenerative changes of the visualized thoracolumbar spine. IMPRESSION: 1.6 x 3.0 cm rounded subpleural opacities in the posteromedial left lower lobe, unchanged. Given the benign biopsy results, the lack of hypermetabolism on PET, and the associated asbestos related pleural disease, this overall appearance remains compatible with rounded atelectasis.  Consider follow-up CT chest in 6-12 months. 4.2 cm ascending thoracic aortic aneurysm, unchanged. Recommend semi-annual imaging followup by CTA or MRA and referral to cardiothoracic surgery if not already obtained. This recommendation follows 2010 ACCF/AHA/AATS/ACR/ASA/SCA/SCAI/SIR/STS/SVM Guidelines for the Diagnosis and Management of Patients With Thoracic Aortic Disease. Circulation. 2010; 121: [id number] Electronically Signed   By: Julian Hy M.D.   On: 12/12/2014 15:07    Nm Pet Image Initial (pi) Skull Base To Thigh  07/19/2014   CLINICAL DATA:  Initial treatment strategy for left lower lobe lung nodule.  EXAM: NUCLEAR MEDICINE PET SKULL BASE TO THIGH  TECHNIQUE: 9.8 mCi F-18 FDG was injected intravenously. Full-ring PET imaging was performed from the skull base to thigh after the radiotracer. CT data was obtained and used for attenuation correction and anatomic localization.  FASTING BLOOD GLUCOSE:  Value: 163 mg/dl  COMPARISON:  Screening chest CT on 07/11/2014  FINDINGS: NECK  No hypermetabolic lymph nodes in the neck.  CHEST  No hypermetabolic mediastinal or hilar nodes. Pleural-based nodular opacity in the posterior left lower lobe measures 1.5 x 2.6 cm, which is stable in size. This shows low-grade metabolic activity with SUV max of 2.4.  A 5 mm pleural-based nodule is all seen in the posterior right lower lobe on image 58/series 7 which is stable and shows no hypermetabolic activity although it is too small to characterize by PET.  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 Mild emphysema again demonstrated.  ABDOMEN/PELVIS  No abnormal hypermetabolic activity within the liver, pancreas, adrenal glands, or spleen. No hypermetabolic lymph nodes in the abdomen or pelvis.  Mildly enlarged prostate seen with mass effect on bladder base.  SKELETON  No focal hypermetabolic activity to suggest skeletal metastasis.  IMPRESSION: 1.5 x 2.6 cm pleural-based nodule in posterior left lower lobe  shows low-grade metabolic activity, which is nonspecific. Differential considerations include low-grade malignancy,  rounded atelectasis, and inflammatory or infectious process.  Nonspecific 5 mm pleural-based nodule in posterior right lower lobe shows no metabolic activity, but is too small to characterize by PET. Continued attention on follow-up CT recommended.  No evidence of thoracic nodal metastases or distant metastatic disease.  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 Recommend semi-annual imaging followup by CTA or MRA and referral to cardiothoracic surgery if not already obtained. This recommendation follows 2010 ACCF/AHA/AATS/ACR/ASA/SCA/SCAI/SIR/STS/SVM Guidelines for the Diagnosis and Management of Patients With Thoracic Aortic Disease. Circulation. 2010; 121SP:[id number].   Electronically Signed   By: Earle Gell M.D.   On: 07/19/2014 10:11   Ct Chest Lung Ca Screen Low Dose W/o Cm  07/11/2014   CLINICAL DATA:  75 year old male current smoker with 90 pack-year history of smoking. Lung cancer screening examination.  EXAM: CT CHEST WITHOUT CONTRAST  TECHNIQUE: Multidetector CT imaging of the chest was performed following the standard protocol without IV contrast.  COMPARISON:  No priors.  FINDINGS: Mediastinum/Lymph Nodes: Heart size is normal. There is no significant pericardial fluid, thickening or pericardial calcification. No pathologically enlarged mediastinal or hilar lymph nodes. There is atherosclerosis of the thoracic aorta, the great vessels of the mediastinum and the coronary arteries, including calcified atherosclerotic plaque in the left main, left anterior descending, left circumflex and right coronary arteries. In addition, there is mild aneurysmal dilatation (4.5 cm) of the ascending thoracic aorta. Calcifications of the aortic valve. Please note that accurate exclusion of hilar adenopathy is limited on noncontrast CT scans. Esophagus is unremarkable in appearance. Saber  sheath trachea. No axillary lymphadenopathy.  Lungs/Pleura: In the posterior aspect of the left lower lobe (image 181 of series 3) there is a pleural-based nodular opacity which has a volume derived mean diameter of approximately 23.8 mm. Tiny subpleural nodule also noted in the periphery of the right lower lobe (image 280 of series 3) as well, with a mean diameter of 6.9 mm. No acute consolidative airspace disease. No pleural effusions. 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 Mild diffuse bronchial wall thickening with mild centrilobular and paraseptal emphysema.  Upper Abdomen: Atherosclerosis.  Otherwise, unremarkable.  Musculoskeletal/Soft Tissues: There are no aggressive appearing lytic or blastic lesions noted in the visualized portions of the skeleton.  IMPRESSION: 1. Lung-RADS Category 4BS, suspicious. Specifically, there is a large pleural based nodule in the posterior left lower lobe which has a volume derived mean diameter of approximately 23.8 mm. Given the apparent asbestos related pleural disease, this could simply represent an area of rounded atelectasis, however, the possibility of a malignant bronchogenic neoplasm, or early malignant mesothelioma is not excluded, and further evaluation with PET-CT is recommended at this time. 2. The "S" modifier above refers to potentially clinically significant non lung cancer related findings. Specifically, Atherosclerosis, including left main and 3 vessel coronary artery disease. Assessment for potential risk factor modification, dietary therapy or pharmacologic therapy may be warranted, if clinically indicated. 3. In addition, there is mild aneurysmal dilatation (4.5 cm in diameter) of the ascending thoracic aorta. Ascending thoracic aortic aneurysm. Recommend semi-annual imaging followup by CTA or MRA and referral to cardiothoracic surgery if not already obtained. This recommendation follows 2010  ACCF/AHA/AATS/ACR/ASA/SCA/SCAI/SIR/STS/SVM Guidelines for the Diagnosis and Management of Patients With Thoracic Aortic Disease. Circulation. 2010; 121: [id number]. 4. Mild diffuse bronchial wall thickening with mild centrilobular and paraseptal emphysema; imaging findings suggestive of underlying COPD. These results were called by telephone at the time of interpretation on 07/11/2014  at 9:58 am to Dr. Claris Gower, who verbally acknowledged these results.   Electronically Signed   By: Vinnie Langton M.D.   On: 07/11/2014 10:05     I have independently reviewed the above radiology studies  and reviewed the findings with the patient.   Recent Lab Findings: Lab Results  Component Value Date   WBC 7.4 04/18/2015   HGB 8.3* 04/18/2015   HCT 25.6* 04/18/2015   PLT 203 04/18/2015   GLUCOSE 164* 04/18/2015   CHOL 151 04/14/2015   TRIG 156* 04/14/2015   HDL 52 04/14/2015   LDLCALC 68 04/14/2015   ALT 7* 04/17/2015   AST 10* 04/17/2015   NA 143 04/18/2015   K 4.3 04/18/2015   CL 105 04/18/2015   CREATININE 1.28* 04/18/2015   BUN 29* 04/18/2015   CO2 28 04/18/2015   TSH 1.239 01/16/2015   INR 1.04 04/13/2015   HGBA1C 7.6* 03/19/2015   Aortic Size Index=     4.5    /Body surface area is 2.18 meters squared. = 2.11  < 2.75 cm/m2      4% risk per year 2.75 to 4.25          8% risk per year > 4.25 cm/m2    20% risk per year  lOWER EXTREMITIY vASCULAR STUDY: Summary: Evidence of 50-99% stenosis of the right mid femoral artery. 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 Recanalized flow to the right popliteal artery. Occlusion of the right ATA (known occlusion). Near occlusion of the right PTA. Monophasic flow in the right peroneal artery.  Other specific details can be found in the table(s) above. Prepared and Electronically Authenticated by  Curt Jews 2016-06-27T16:38:16   Assessment / Plan:   Stable left plural base opacity left lower lobe, previously  biopsied negative has not changed since previous scan  8  months ago. The patient does have a history of asbestos exposure in the past.   Ascending thoracic aortic aneurysm-mild aneurysmal dilatation (4.5 cm in diameter) of the ascending thoracic aorta-patient has no murmur of aortic insufficiency  or aortic stenosis unchanged since the previous scan  Atherosclerosis, including left main and 3 vessel coronary artery disease - the patient denies any definite anginal symptoms but does have known left main and three-vessel coronary artery disease on a nonspecific CT scan based on calcium only. He is followed by Dr. Wynonia Lawman cardiology  He is followed in the vascular surgery office following foot infection and bypass surgery in the right leg.   I will see the patient back 8 months with repeat ct of chest no contrast to follow up on left lung lesion and dilated ascending aorta.  I discussed with the patient various techniques to help stop smoking, he has no intention of stopping   [place], [address] Office [phone]   Beeper [phone]  08/14/2015 3:13 PM

## 2015-08-23 ENCOUNTER — Encounter (HOSPITAL_COMMUNITY): Payer: Medicare Other

## 2015-08-23 ENCOUNTER — Ambulatory Visit: Payer: Medicare Other | Admitting: Vascular Surgery

## 2015-08-25 ENCOUNTER — Encounter: Payer: Self-pay | Admitting: Vascular Surgery

## 2015-08-30 ENCOUNTER — Ambulatory Visit (INDEPENDENT_AMBULATORY_CARE_PROVIDER_SITE_OTHER): Payer: Medicare Other | Admitting: Vascular Surgery

## 2015-08-30 ENCOUNTER — Ambulatory Visit (HOSPITAL_COMMUNITY)
Admission: RE | Admit: 2015-08-30 | Discharge: 2015-08-30 | Disposition: A | Discharge status: Home / Self Care | Payer: Medicare Other | Source: Ambulatory Visit | Attending: Vascular Surgery | Admitting: Vascular Surgery

## 2015-08-30 ENCOUNTER — Encounter: Payer: Self-pay | Admitting: Vascular Surgery

## 2015-08-30 VITALS — BP 119/71 | HR 64 | Temp 97.6°F | Resp 20 | Ht 74.0 in | Wt 201.0 lb

## 2015-08-30 DIAGNOSIS — K219 Gastro-esophageal reflux disease without esophagitis: Secondary | ICD-10-CM | POA: Insufficient documentation

## 2015-08-30 DIAGNOSIS — I739 Peripheral vascular disease, unspecified: Secondary | ICD-10-CM | POA: Diagnosis not present

## 2015-08-30 DIAGNOSIS — I129 Hypertensive chronic kidney disease with stage 1 through stage 4 chronic kidney disease, or unspecified chronic kidney disease: Secondary | ICD-10-CM | POA: Diagnosis not present

## 2015-08-30 DIAGNOSIS — R938 Abnormal findings on diagnostic imaging of other specified body structures: Secondary | ICD-10-CM | POA: Diagnosis not present

## 2015-08-30 DIAGNOSIS — Z720 Tobacco use: Secondary | ICD-10-CM | POA: Insufficient documentation

## 2015-08-30 DIAGNOSIS — E1142 Type 2 diabetes mellitus with diabetic polyneuropathy: Secondary | ICD-10-CM | POA: Insufficient documentation

## 2015-08-30 DIAGNOSIS — N189 Chronic kidney disease, unspecified: Secondary | ICD-10-CM | POA: Insufficient documentation

## 2015-08-30 DIAGNOSIS — F329 Major depressive disorder, single episode, unspecified: Secondary | ICD-10-CM | POA: Diagnosis not present

## 2015-08-30 DIAGNOSIS — E7800 Pure hypercholesterolemia, unspecified: Secondary | ICD-10-CM | POA: Diagnosis not present

## 2015-08-30 DIAGNOSIS — E1122 Type 2 diabetes mellitus with diabetic chronic kidney disease: Secondary | ICD-10-CM | POA: Insufficient documentation

## 2015-08-30 DIAGNOSIS — R0989 Other specified symptoms and signs involving the circulatory and respiratory systems: Secondary | ICD-10-CM | POA: Diagnosis present

## 2015-08-30 DIAGNOSIS — I70261 Atherosclerosis of native arteries of extremities with gangrene, right leg: Secondary | ICD-10-CM | POA: Diagnosis not present

## 2015-08-30 NOTE — Progress Notes (Signed)
[facility] [place]  Patient name: Marc [name] MRN: [id number] DOB: [birth year] Sex: male  REASON FOR VISIT: Follow up of right lower extremity bypass and right great toe amputation.  HPI: Marc [name] is a 75 y.o. male Who I last saw on 07/19/2015. He had a right common femoral artery to tibial peroneal trunk bypass with a vein graft on 02/07/2015. He said when he had ray amputation of the right first and second toes. When I saw him back in June the toe amputation site had healed. He had been having some right leg swelling. He is also being followed by Dr. [name] with a possible right knee infection. He comes in for a follow up visit.  He has no specific complaints. The swelling in the right leg has improved somewhat. He denies significant claudication in the right leg or rest pain. He denies fever or chills.  Past Medical History:  Diagnosis Date  . Anemia    low iron  . ARF (acute renal failure) ([place]) 12/2014  . Arthritis   . Bradycardia   . Chronic bronchitis ([place])   . CKD (chronic kidney disease), stage III   . Colon polyp   . Depression    occassional.  Situational  . GERD (gastroesophageal reflux disease)   . Gout   . Gout   . History of blood transfusion   . History of GI bleed 2007  . Hypercholesteremia   . Hypertension   . Neuropathy ([place])   . PAD (peripheral artery disease) ([place])   . Pneumonia    denies  . Tobacco abuse disorder 03/10/2012  . Type II diabetes mellitus ([place])   . Ulcer disease     Family History  Problem Relation Age of Onset  . Colon cancer Paternal Uncle     Uncle  . Heart disease Father   . Hypertension Father   . Heart attack Father   . Heart disease Mother   . Diabetes Mother   . Hypertension Mother     SOCIAL HISTORY: Social History  Substance Use Topics  . Smoking status: Current Every Day Smoker    Packs/day: 1.00    Years: 58.00    Types: Cigarettes  . Smokeless tobacco: Former Systems developer    Types:  Chew     Comment: Pt states he's going to use the cigarettes he has today (02/07/15) and will quit  . Alcohol use No    No Known Allergies  Current Outpatient Prescriptions  Medication Sig Dispense Refill  . acetaminophen (TYLENOL) 500 MG tablet Take 1,000 mg by mouth every 6 (six) hours as needed for fever. Reported on 06/01/2015    . allopurinol (ZYLOPRIM) 300 MG tablet Take 300 mg by mouth 2 (two) times daily.     [name] aspirin EC 325 MG tablet Take 1 tablet (325 mg total) by mouth 2 (two) times daily. 30 tablet 0  . atorvastatin (LIPITOR) 10 MG tablet Take 10 mg by mouth at bedtime.     . cefUROXime (CEFTIN) 500 MG tablet Take 1 tablet (500 mg total) by mouth 2 (two) times daily with a meal. 60 tablet 4  . citalopram (CELEXA) 20 MG tablet Take 20 mg by mouth daily.    . colchicine 0.6 MG tablet Take 1 tablet (0.6 mg total) by mouth daily. (Patient taking differently: Take 0.6 mg by mouth at bedtime. ) 10 tablet 0  . doxycycline (VIBRA-TABS) 100 MG tablet Take 1 tablet (100 mg total) by  mouth 2 (two) times daily. 60 tablet 4  . furosemide (LASIX) 80 MG tablet Take 0.5 tablets (40 mg total) by mouth daily.    [name] gabapentin (NEURONTIN) 600 MG tablet Take 1,200 mg by mouth 2 (two) times daily.     [name] glimepiride (AMARYL) 4 MG tablet Take 4 mg by mouth 2 (two) times daily.     . pioglitazone (ACTOS) 30 MG tablet Take 30 mg by mouth daily.    . potassium gluconate 595 MG TABS tablet Take 595 mg by mouth at bedtime.     . predniSONE (DELTASONE) 10 MG tablet Take 10 mg by mouth daily with breakfast.     . amLODipine (NORVASC) 10 MG tablet Take 1 tablet (10 mg total) by mouth daily. (Patient not taking: Reported on 08/30/2015) 30 tablet 0   No current facility-administered medications for this visit.    Facility-Administered Medications Ordered in Other Visits  Medication Dose Route Frequency Provider Last Rate Last Dose  . 0.9 %  sodium chloride infusion   Intravenous Continuous Angelia [name],  MD      . Chlorhexidine Gluconate Cloth 2 % PADS 6 each  6 each Topical Once Angelia [name], MD        REVIEW OF SYSTEMS:  [X]  denotes positive finding, [ ]  denotes negative finding Cardiac  Comments:  Chest pain or chest pressure:    Shortness of breath upon exertion:    Short of breath when lying flat:    Irregular heart rhythm:        Vascular    Pain in calf, thigh, or hip brought on by ambulation:    Pain in feet at night that wakes you up from your sleep:     Blood clot in your veins:    Leg swelling:         Pulmonary    Oxygen at home:    Productive cough:     Wheezing:         Neurologic    Sudden weakness in arms or legs:     Sudden numbness in arms or legs:     Sudden onset of difficulty speaking or slurred speech:    Temporary loss of vision in one eye:     Problems with dizziness:         Gastrointestinal    Blood in stool:     Vomited blood:         Genitourinary    Burning when urinating:     Blood in urine:        Psychiatric    Major depression:         Hematologic    Bleeding problems:    Problems with blood clotting too easily:        Skin    Rashes or ulcers:        Constitutional    Fever or chills:      PHYSICAL EXAM: Vitals:   08/30/15 1156  BP: 119/71  Pulse: 64  Resp: 20  Temp: 97.6 F (36.4 C)  TempSrc: Oral  SpO2: 98%  Weight: 201 lb (91.2 kg)  Height: 6\' 2"  (1.88 m)    GENERAL: The patient is a well-nourished male, in no acute distress. The vital signs are documented above. CARDIAC: There is a regular rate and rhythm.  VASCULAR: He has a palpable femoral and popliteal pulse in the right leg. He has biphasic Doppler signals in the right foot as documented below. He has  moderate right lower extremity swelling. PULMONARY: There is good air exchange bilaterally without wheezing or rales. ABDOMEN: Soft and non-tender with normal pitched bowel sounds.  MUSCULOSKELETAL: His right foot has healed nicely. NEUROLOGIC:  No focal weakness or paresthesias are detected. SKIN: There are no ulcers or rashes noted. PSYCHIATRIC: The patient has a normal affect.  DATA:   LOWER EXTREMITY ARTERIAL DOPPLER STUDY: I have independently interpreted his lower extremity arterial Doppler study.  On the right side, which is the site of his bypass, he has a biphasic posterior tibial signal and dorsalis pedis signal. ABI on the right is 100%.  On the left side, he has a monophasic dorsalis pedis signal and posterior tibial signal. ABI on the left is 59%.  MEDICAL ISSUES:  STATUS POST FEMORAL-TIBIAL BYPASS RIGHT LOWER EXTREMITY: He has biphasic Doppler signals in the right foot with a normal ABI. His bypass graft is functioning well. I have ordered ABIs and a graft duplex in November of this year and we'll continue to follow his graft closely. I've encouraged him to stay as active as possible. He is on aspirin and is on a statin.  Deitra Mayo [facility] [phone]

## 2015-09-13 ENCOUNTER — Ambulatory Visit: Payer: Medicare Other | Admitting: Internal Medicine

## 2015-09-14 ENCOUNTER — Encounter: Payer: Self-pay | Admitting: Internal Medicine

## 2015-09-14 ENCOUNTER — Ambulatory Visit (INDEPENDENT_AMBULATORY_CARE_PROVIDER_SITE_OTHER): Payer: Medicare Other | Admitting: Internal Medicine

## 2015-09-14 VITALS — BP 102/66 | HR 88 | Temp 98.0°F | Ht 73.0 in | Wt 202.0 lb

## 2015-09-14 DIAGNOSIS — Z96659 Presence of unspecified artificial knee joint: Principal | ICD-10-CM

## 2015-09-14 DIAGNOSIS — Z792 Long term (current) use of antibiotics: Secondary | ICD-10-CM | POA: Diagnosis not present

## 2015-09-14 DIAGNOSIS — T8459XD Infection and inflammatory reaction due to other internal joint prosthesis, subsequent encounter: Secondary | ICD-10-CM

## 2015-09-14 DIAGNOSIS — T8450XD Infection and inflammatory reaction due to unspecified internal joint prosthesis, subsequent encounter: Secondary | ICD-10-CM

## 2015-09-14 LAB — COMPLETE METABOLIC PANEL WITH GFR
ALT: 4 U/L — ABNORMAL LOW (ref 9–46)
AST: 10 U/L (ref 10–35)
Albumin: 3.7 g/dL (ref 3.6–5.1)
Alkaline Phosphatase: 84 U/L (ref 40–115)
BUN: 76 mg/dL — ABNORMAL HIGH (ref 7–25)
CALCIUM: 9.4 mg/dL (ref 8.6–10.3)
CHLORIDE: 109 mmol/L (ref 98–110)
CO2: 20 mmol/L (ref 20–31)
CREATININE: 2.21 mg/dL — AB (ref 0.70–1.18)
GFR, EST AFRICAN AMERICAN: 32 mL/min — AB (ref >=60)
GFR, EST NON AFRICAN AMERICAN: 28 mL/min — AB (ref >=60)
Glucose, Bld: 141 mg/dL — ABNORMAL HIGH (ref 65–99)
POTASSIUM: 5.9 mmol/L — AB (ref 3.5–5.3)
Sodium: 141 mmol/L (ref 135–146)
Total Bilirubin: 0.3 mg/dL (ref 0.2–1.2)
Total Protein: 6.8 g/dL (ref 6.1–8.1)

## 2015-09-14 LAB — CBC WITH DIFFERENTIAL/PLATELET
Basophils Absolute: 0 cells/uL (ref 0–200)
Basophils Relative: 0 %
EOS ABS: 300 {cells}/uL (ref 15–500)
Eosinophils Relative: 3 %
HEMATOCRIT: 38 % — AB (ref 38.5–50.0)
Hemoglobin: 12.5 g/dL — ABNORMAL LOW (ref 13.2–17.1)
LYMPHS PCT: 17 %
Lymphs Abs: 1700 cells/uL (ref 850–3900)
MCH: 31.9 pg (ref 27.0–33.0)
MCHC: 32.9 g/dL (ref 32.0–36.0)
MCV: 96.9 fL (ref 80.0–100.0)
MONO ABS: 400 {cells}/uL (ref 200–950)
MONOS PCT: 4 %
MPV: 10.1 fL (ref 7.5–12.5)
NEUTROS PCT: 76 %
Neutro Abs: 7600 cells/uL (ref 1500–7800)
PLATELETS: 239 10*3/uL (ref 140–400)
RBC: 3.92 MIL/uL — ABNORMAL LOW (ref 4.20–5.80)
RDW: 16.8 % — AB (ref 11.0–15.0)
WBC: 10 10*3/uL (ref 3.8–10.8)

## 2015-09-14 LAB — SEDIMENTATION RATE: SED RATE: 34 mm/h — AB (ref 0–20)

## 2015-09-14 LAB — C-REACTIVE PROTEIN: CRP: 0.8 mg/dL — ABNORMAL HIGH (ref <0.60)

## 2015-09-14 MED ORDER — DOXYCYCLINE HYCLATE 100 MG PO TABS: 100.0000 mg | ORAL_TABLET | Freq: Two times a day (BID) | ORAL | 11 refills | Status: DC

## 2015-09-14 NOTE — Progress Notes (Signed)
   Subjective:    Patient ID: Marc [name], male    DOB: 05/23/1940, 75 y.o.   MRN: [id number]  HPI Here for follow up of PJI.  Marc [name] is a 75 y.o. male with type 2 DM, severe PVD, CKD3, CAD, smoker who has hx of right TKA 2015, most recently had vascular surgery of right femoral to tibial peroneal trunk bypass with vein graft, endarectomy of tibial peroneal trunk with vein patch angioplasty and first ray amputation and placement of wound vac for right great toe dry gangrene on 1/10. He had post operative swelling of right leg and poor wound healing of right foot wound thus on 2/21 underwent I x D, plus 2nd toe ray amputation and wound vac placement. OR cultures grew MRSA and he was discharged on doxycycline for 2 wk which he completed in early March. Thus while off of abtx, he started to have swelling , erythema, and pain to right prosthetic knee join starting on 3/12, he went to Dr. [name] for evaluation as an outpatient and underwent arthrocentesis which was concerning for infection given cloudy fluid. (unclear of cell coutn and culture results). Initially discussed 2 staged revision though the anastomosis of bypass is near his knee that it would likely be compromised if doing 2 staged revision. He was admitted on 3/17 for I x D, exchange of tibial bearing components. OR note suggests early infection,as does clinical history.  He was started on vancomycin and ceftriaxone for projected 6 weeks through 4/27.  Culture remained negative.  Initial CRP of 12 and ESR of 119.  Since then his ESR has reduced to 29 and CRP to 9.0.  CRP though has fluctuated a bit, has been higher from labs from [place] when [place] labs earlier in the course the CRP was normal.  He also had some increase in eosinophilia that resolved and he had remained asymptomatic with no itching, no other issues.  Foot doing well and no longer needs wound care appts.   He has been on oral therapy since his last visit and is  continuing to be followed by Dr. [name].      Review of Systems  Constitutional: Negative for chills and fever.  Gastrointestinal: Negative for diarrhea.  Skin: Negative for rash.  Neurological: Negative for dizziness.       Objective:   Physical Exam  Constitutional: He appears well-developed and well-nourished. No distress.  Eyes: No scleral icterus.  Cardiovascular: Normal rate, regular rhythm and normal heart sounds.   Neurological: He is alert.  Skin: No rash noted.   Social History   Social History  . Marital status: Legally Separated    Spouse name: N/A  . Number of children: 2  . Years of education: N/A   Occupational History  . retired   . cattle farm     owner   Social History Main Topics  . Smoking status: Current Every Day Smoker    Packs/day: 1.00    Years: 58.00    Types: Cigarettes  . Smokeless tobacco: Former Systems developer    Types: Chew  . Alcohol use No  . Drug use: No  . Sexual activity: No   Other Topics Concern  . Not on file   Social History Narrative   Patient lives at home with his spouse.   Caffeine Use: 2 cups daily         Assessment & Plan:

## 2015-09-14 NOTE — Assessment & Plan Note (Signed)
Will monitor cmp and cbc to be sure no side effects.  Counseled on sunscreen or sun avoidance, sitting up when taking it for 30 minutes.

## 2015-09-14 NOTE — Assessment & Plan Note (Signed)
I will have him continue doxycycline indefinitely.  ESR, CRP today and rtc 6 months.

## 2015-10-19 ENCOUNTER — Telehealth: Payer: Self-pay

## 2015-10-19 DIAGNOSIS — Z96659 Presence of unspecified artificial knee joint: Principal | ICD-10-CM

## 2015-10-19 DIAGNOSIS — T8459XS Infection and inflammatory reaction due to other internal joint prosthesis, sequela: Secondary | ICD-10-CM

## 2015-10-19 MED ORDER — DOXYCYCLINE HYCLATE 100 MG PO TABS: 100.0000 mg | ORAL_TABLET | Freq: Two times a day (BID) | ORAL | 3 refills | Status: DC

## 2015-10-19 NOTE — Telephone Encounter (Signed)
Patient calling for 90 day supply of doxycycline to mail order .

## 2015-10-23 ENCOUNTER — Telehealth: Payer: Self-pay | Admitting: *Deleted

## 2015-10-23 DIAGNOSIS — Z96659 Presence of unspecified artificial knee joint: Principal | ICD-10-CM

## 2015-10-23 DIAGNOSIS — T8459XD Infection and inflammatory reaction due to other internal joint prosthesis, subsequent encounter: Secondary | ICD-10-CM

## 2015-10-23 MED ORDER — DOXYCYCLINE MONOHYDRATE 100 MG PO CAPS: 100.0000 mg | ORAL_CAPSULE | Freq: Two times a day (BID) | ORAL | 3 refills | Status: DC

## 2015-10-23 NOTE — Telephone Encounter (Signed)
Co-pay for doxycycline hyclate capsules too high for the patient to afford, $248/3 months.  Patient asking if there is an alternative medication that he can take since he needs to be on the medication indefinitely.  RN consulted with pharmacist and found that doxycycline monohyrate capsules were only $94.59 for a 90-day supply.  Change to doxycycline monohydrate capsules phoned rx to [facility] for the pt.  Pt informed of the price change and was happy with the price.

## 2015-10-24 NOTE — Telephone Encounter (Signed)
Thanks

## 2015-10-25 ENCOUNTER — Emergency Department (HOSPITAL_COMMUNITY)
Admission: EM | Admit: 2015-10-25 | Discharge: 2015-10-25 | Disposition: A | Discharge status: Home / Self Care | Payer: Medicare Other | Attending: Emergency Medicine | Admitting: Emergency Medicine

## 2015-10-25 ENCOUNTER — Emergency Department (HOSPITAL_BASED_OUTPATIENT_CLINIC_OR_DEPARTMENT_OTHER)
Admit: 2015-10-25 | Discharge: 2015-10-25 | Disposition: A | Discharge status: Home / Self Care | Payer: Medicare Other | Attending: Emergency Medicine | Admitting: Emergency Medicine

## 2015-10-25 ENCOUNTER — Encounter (HOSPITAL_COMMUNITY): Payer: Self-pay | Admitting: Emergency Medicine

## 2015-10-25 ENCOUNTER — Encounter: Payer: Self-pay | Admitting: Vascular Surgery

## 2015-10-25 DIAGNOSIS — I2510 Atherosclerotic heart disease of native coronary artery without angina pectoris: Secondary | ICD-10-CM | POA: Insufficient documentation

## 2015-10-25 DIAGNOSIS — Z7984 Long term (current) use of oral hypoglycemic drugs: Secondary | ICD-10-CM | POA: Diagnosis not present

## 2015-10-25 DIAGNOSIS — M79661 Pain in right lower leg: Secondary | ICD-10-CM | POA: Diagnosis present

## 2015-10-25 DIAGNOSIS — E1122 Type 2 diabetes mellitus with diabetic chronic kidney disease: Secondary | ICD-10-CM | POA: Insufficient documentation

## 2015-10-25 DIAGNOSIS — F17210 Nicotine dependence, cigarettes, uncomplicated: Secondary | ICD-10-CM | POA: Diagnosis not present

## 2015-10-25 DIAGNOSIS — N183 Chronic kidney disease, stage 3 (moderate): Secondary | ICD-10-CM | POA: Insufficient documentation

## 2015-10-25 DIAGNOSIS — Z955 Presence of coronary angioplasty implant and graft: Secondary | ICD-10-CM | POA: Diagnosis not present

## 2015-10-25 DIAGNOSIS — L03115 Cellulitis of right lower limb: Secondary | ICD-10-CM | POA: Diagnosis not present

## 2015-10-25 DIAGNOSIS — I5032 Chronic diastolic (congestive) heart failure: Secondary | ICD-10-CM | POA: Diagnosis not present

## 2015-10-25 DIAGNOSIS — I130 Hypertensive heart and chronic kidney disease with heart failure and stage 1 through stage 4 chronic kidney disease, or unspecified chronic kidney disease: Secondary | ICD-10-CM | POA: Insufficient documentation

## 2015-10-25 DIAGNOSIS — R609 Edema, unspecified: Secondary | ICD-10-CM | POA: Diagnosis not present

## 2015-10-25 DIAGNOSIS — Z79899 Other long term (current) drug therapy: Secondary | ICD-10-CM | POA: Diagnosis not present

## 2015-10-25 DIAGNOSIS — Z7982 Long term (current) use of aspirin: Secondary | ICD-10-CM | POA: Insufficient documentation

## 2015-10-25 DIAGNOSIS — M7989 Other specified soft tissue disorders: Secondary | ICD-10-CM

## 2015-10-25 LAB — I-STAT CHEM 8, ED
BUN: 55 mg/dL — ABNORMAL HIGH (ref 6–20)
CALCIUM ION: 1.32 mmol/L (ref 1.15–1.40)
Chloride: 114 mmol/L — ABNORMAL HIGH (ref 101–111)
Creatinine, Ser: 1.8 mg/dL — ABNORMAL HIGH (ref 0.61–1.24)
GLUCOSE: 133 mg/dL — AB (ref 65–99)
HCT: 32 % — ABNORMAL LOW (ref 39.0–52.0)
HEMOGLOBIN: 10.9 g/dL — AB (ref 13.0–17.0)
Potassium: 5.5 mmol/L — ABNORMAL HIGH (ref 3.5–5.1)
Sodium: 140 mmol/L (ref 135–145)
TCO2: 21 mmol/L (ref 0–100)

## 2015-10-25 MED ORDER — FUROSEMIDE 80 MG PO TABS: 80.0000 mg | ORAL_TABLET | Freq: Every day | ORAL | 0 refills | Status: DC

## 2015-10-25 MED ORDER — CEPHALEXIN 500 MG PO CAPS: 500.0000 mg | ORAL_CAPSULE | Freq: Three times a day (TID) | ORAL | 0 refills | Status: AC

## 2015-10-25 NOTE — Progress Notes (Signed)
*  Preliminary Results* Bilateral lower extremity venous duplex completed. Bilateral lower extremities are negative for deep vein thrombosis. There is no evidence of Baker's cyst bilaterally.  10/25/2015 12:37 PM Maudry Mayhew, BS, RVT, RDCS, RDMS

## 2015-10-25 NOTE — ED Triage Notes (Signed)
Pt reports bilateral ankle edema, Hx diabetes. Obvious redness on right ankle. painful to touch per pt. alert and oriented x 4.

## 2015-10-25 NOTE — ED Provider Notes (Signed)
[facility] DEPT Provider Note   CSN: [id number] Arrival date & time: 10/25/15  0907     History   Chief Complaint Chief Complaint  Patient presents with  . Leg Swelling    bilateral     HPI JOSON GAYHEART is a 75 y.o. male.  The history is provided by the patient.  Leg Pain   This is a recurrent problem. The current episode started yesterday. The problem occurs constantly. The problem has been gradually worsening. The pain is present in the right lower leg. The pain is moderate. Associated symptoms comments: Swelling, pain redness. The symptoms are aggravated by activity. He has tried nothing for the symptoms. There has been no history of extremity trauma.    Past Medical History:  Diagnosis Date  . Anemia    low iron  . ARF (acute renal failure) ([place]) 12/2014  . Arthritis   . Bradycardia   . Chronic bronchitis ([place])   . CKD (chronic kidney disease), stage III   . Colon polyp   . Depression    occassional.  Situational  . GERD (gastroesophageal reflux disease)   . Gout   . Gout   . History of blood transfusion   . History of GI bleed 2007  . Hypercholesteremia   . Hypertension   . Neuropathy ([place])   . PAD (peripheral artery disease) ([place])   . Pneumonia    denies  . Tobacco abuse disorder 03/10/2012  . Type II diabetes mellitus ([place])   . Ulcer disease     Patient Active Problem List   Diagnosis Date Noted  . Antibiotic long-term use 09/14/2015  . 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 Right foot infection 04/13/2015  . Pressure ulcer 03/20/2015  . Cellulitis of right lower extremity   . Chronic diastolic CHF (congestive heart failure) ([place])   . Chronic anemia 03/01/2015  . Type 2 diabetes mellitus with renal complication ([place]) 99991111  . Atherosclerosis of native arteries of extremities with gangrene, right leg ([place]) 02/07/2015  . Type 2 diabetes mellitus with right diabetic foot infection ([place])   . CAD (coronary artery  disease), native coronary artery   . Ascending aortic aneurysm ([place])   . Mass of lower lobe of left lung   . Chronic kidney disease stage 3   . Peripheral vascular disease with claudication ([place])   . Gout 02/07/2013  . Hyperlipidemia 02/07/2013  . GERD (gastroesophageal reflux disease) 02/07/2013  . Type 2 diabetes mellitus with peripheral neuropathy ([facility])   . Spinal stenosis of lumbar region 01/14/2013  . Tobacco abuse disorder 03/10/2012  . Bradycardia:  Normal condition for this patient. Previously worked up. 02/05/2012  . Hx of peptic ulcer 05/21/2010  . Hx of adenomatous colonic polyps 05/21/2010  . Hypertensive heart disease   . Lumbar disc disease     Past Surgical History:  Procedure Laterality Date  . AMPUTATION Right 02/07/2015   Procedure: GREAT TOE RAY AMPUTATION RIGHT ;  Surgeon: Angelia [name], MD;  Location: [facility];  Service: Vascular;  Laterality: Right;  . AMPUTATION TOE Right 03/21/2015   Procedure: AMPUTATION TOE-RIGHT SECOND TOE;  Surgeon: Angelia [name], MD;  Location: [facility];  Service: Vascular;  Laterality: Right;  . ANTERIOR CERVICAL DECOMP/DISCECTOMY FUSION  ~ 2007  . ANTERIOR LAT LUMBAR FUSION Left 01/19/2014   Procedure: LATERAL INTERBODY FUSION 1 LEVEL;  Surgeon: Sinclair Ship, MD;  Location: [facility];  Service: Orthopedics;  Laterality: Left;  Left lumbar 3-4  lateral interbody fusion with instrumentation, allograft  . APPLICATION OF WOUND VAC Right 02/07/2015   Procedure:  APPLICATION OF WOUND VAC right great toe amputation site.;  Surgeon: Angelia [name], MD;  Location: [facility];  Service: Vascular;  Laterality: Right;  . APPLICATION OF WOUND VAC Right 03/21/2015   Procedure: APPLICATION OF WOUND VAC RIGHT FOOT;  Surgeon: Angelia [name], MD;  Location: [facility];  Service: Vascular;  Laterality: Right;  . BACK SURGERY    . BYPASS GRAFT FEMORAL-PERONEAL Right 02/07/2015   Procedure: BYPASS GRAFT FEMORAL-PERONEAL Trunk WITH VEIN graft   right leg.;  Surgeon: Angelia [name], MD;  Location: [facility];  Service: Vascular;  Laterality: Right;  . COLONOSCOPY    . ENDARTERECTOMY TIBIOPERONEAL Right 02/07/2015   Procedure: ENDARTERECTOMY TIBIOPERONEAL;  Surgeon: Angelia [name], MD;  Location: [facility];  Service: Vascular;  Laterality: Right;  . EXCISIONAL TOTAL KNEE ARTHROPLASTY WITH ANTIBIOTIC SPACERS Right 04/14/2015   Procedure: RIGHT KNEE [place] OUT AND PLACEMENT OF DRAINS;  Surgeon: Frederik Pear, MD;  Location: [facility];  Service: Orthopedics;  Laterality: Right;  . EYE SURGERY Bilateral    cataract surgery with lens implant  . HERNIA REPAIR  ~ 2007   UHR (02/04/2012)  . ILIAC ARTERY STENT Left    left common/notes (02/04/2012)  . INTRAOPERATIVE ARTERIOGRAM Right 02/07/2015   Procedure: INTRA OPERATIVE ARTERIOGRAM;  Surgeon: Angelia [name], MD;  Location: [facility];  Service: Vascular;  Laterality: Right;  . IRRIGATION AND DEBRIDEMENT KNEE Right 04/14/2015   Procedure: IRRIGATION AND DEBRIDEMENT KNEE;  Surgeon: Frederik Pear, MD;  Location: [facility];  Service: Orthopedics;  Laterality: Right;  . LOWER EXTREMITY ANGIOGRAM N/A 02/04/2012   Procedure: LOWER EXTREMITY ANGIOGRAM;  Surgeon: Lorretta Harp, MD;  Location: [facility] C[facility];  Service: Cardiovascular;  Laterality: N/A;  . LUMBAR LAMINECTOMY  11/14  . PATCH ANGIOPLASTY Right 02/07/2015   Procedure: Vein PATCH ANGIOPLASTY to tibioperoneal trunk;  Surgeon: Angelia [name], MD;  Location: [facility];  Service: Vascular;  Laterality: Right;  . PERCUTANEOUS STENT INTERVENTION Left 02/04/2012   Procedure: PERCUTANEOUS STENT INTERVENTION;  Surgeon: Lorretta Harp, MD;  Location: [facility] CATH LAB;  Service: Cardiovascular;  Laterality: Left;  lt ext iliac stent  . PERIPHERAL VASCULAR CATHETERIZATION N/A 02/02/2015   Procedure: Abdominal Aortogram w/Lower Extremity;  Surgeon: Angelia [name], MD;  Location: [facility] CV LAB;  Service: Cardiovascular;  Laterality: N/A;  . POLYPECTOMY    .  REPLACEMENT TOTAL KNEE Right   . SPLENECTOMY  ~ 1957  . TONSILLECTOMY  ~ 1947  . TOTAL KNEE ARTHROPLASTY Right 02/03/2013   Procedure: TOTAL KNEE ARTHROPLASTY;  Surgeon: [name], MD;  Location: [facility];  Service: Orthopedics;  Laterality: Right;  . UPPER ENDOSCOPY W/ SCLEROTHERAPY  ~ 2007  . WOUND DEBRIDEMENT Right 03/21/2015   Procedure: DEBRIDEMENT WOUND of RIGHT FOOT;  Surgeon: Angelia [name], MD;  Location: [facility];  Service: Vascular;  Laterality: Right;       Home Medications    Prior to Admission medications   Medication Sig Start Date End Date Taking? Authorizing Provider  allopurinol (ZYLOPRIM) 300 MG tablet Take 300 mg by mouth at bedtime.    Yes Historical Provider, MD  aspirin EC 325 MG tablet Take 1 tablet (325 mg total) by mouth 2 (two) times daily. 04/17/15  Yes Leighton Parody, PA-C  atorvastatin (LIPITOR) 10 MG tablet Take 10 mg by mouth at bedtime.  11/23/14  Yes Historical Provider, MD  clopidogrel (PLAVIX) 75 MG tablet Take 75 mg by mouth every other day.   Yes Historical Provider, MD  docusate sodium (COLACE) 100 MG capsule Take 100 mg by mouth 2 (two) times daily.   Yes Historical Provider, MD  furosemide (LASIX) 80 MG tablet Take 0.5 tablets (40 mg total) by mouth daily. Patient taking differently: Take 80 mg by mouth daily as needed for fluid.  04/18/15  Yes Nita Sells, MD  gabapentin (NEURONTIN) 600 MG tablet Take 1,200 mg by mouth 2 (two) times daily.    Yes Historical Provider, MD  glimepiride (AMARYL) 4 MG tablet Take 4 mg by mouth 2 (two) times daily.    Yes Historical Provider, MD  indomethacin (INDOCIN) 25 MG capsule Take 50 mg by mouth 2 (two) times daily as needed for mild pain.   Yes Historical Provider, MD  lisinopril (PRINIVIL,ZESTRIL) 20 MG tablet Take 20 mg by mouth daily. 08/29/15  Yes Historical Provider, MD  lovastatin (MEVACOR) 40 MG tablet Take 40 mg by mouth at bedtime.   Yes Historical Provider, MD  oxyCODONE-acetaminophen  (PERCOCET/ROXICET) 5-325 MG tablet Take 1 tablet by mouth 2 (two) times daily as needed for moderate pain.  08/25/15  Yes Historical Provider, MD  pantoprazole (PROTONIX) 40 MG tablet Take 40 mg by mouth daily. 08/29/15  Yes Historical Provider, MD  pioglitazone (ACTOS) 30 MG tablet Take 30 mg by mouth daily. 04/13/15  Yes Historical Provider, MD  predniSONE (DELTASONE) 10 MG tablet Take 20 mg by mouth daily with breakfast.    Yes Historical Provider, MD  doxycycline (MONODOX) 100 MG capsule Take 1 capsule (100 mg total) by mouth 2 (two) times daily. 10/23/15   [name], MD    Family History Family History  Problem Relation Age of Onset  . Colon cancer Paternal Uncle     Uncle  . Heart disease Father   . Hypertension Father   . Heart attack Father   . Heart disease Mother   . Diabetes Mother   . Hypertension Mother     Social History Social History  Substance Use Topics  . Smoking status: Current Every Day Smoker    Packs/day: 1.00    Years: 58.00    Types: Cigarettes  . Smokeless tobacco: Former Systems developer    Types: Chew  . Alcohol use No     Allergies   Review of patient's allergies indicates no known allergies.   Review of Systems Review of Systems  All other systems reviewed and are negative.    Physical Exam Updated Vital Signs BP 125/55   Pulse 63   Temp 98 F (36.7 C) (Oral)   Resp 18   Ht 6\' 1"  (1.854 m)   Wt 206 lb (93.4 kg)   SpO2 99%   BMI 27.18 kg/m   Physical Exam  Constitutional: He is oriented to person, place, and time. He appears well-developed and well-nourished. No distress.  HENT:  Head: Normocephalic and atraumatic.  Eyes: Conjunctivae are normal.  Neck: Neck supple. No tracheal deviation present.  Cardiovascular: Normal rate and regular rhythm.   Pulmonary/Chest: Effort normal. No respiratory distress.  Abdominal: Soft. He exhibits no distension.  Musculoskeletal:  B/l 4+ pitting edema bilaterally with erythema and warmth of right  lower leg distal to knee with good cap refill  Neurological: He is alert and oriented to person, place, and time.  Skin: Skin is warm and dry.  Psychiatric: He has a normal mood and affect.     ED  Treatments / Results  Labs (all labs ordered are listed, but only abnormal results are displayed) Labs Reviewed  I-STAT CHEM 8, ED - Abnormal; Notable for the following:       Result Value   Potassium 5.5 (*)    Chloride 114 (*)    BUN 55 (*)    Creatinine, Ser 1.80 (*)    Glucose, Bld 133 (*)    Hemoglobin 10.9 (*)    HCT 32.0 (*)    All other components within normal limits    EKG  EKG Interpretation None       Radiology No results found.  Procedures Procedures (including critical care time)  Medications Ordered in ED Medications - No data to display   Initial Impression / Assessment and Plan / ED Course  I have reviewed the triage vital signs and the nursing notes.  Pertinent labs & imaging results that were available during my care of the patient were reviewed by me and considered in my medical decision making (see chart for details).  Clinical Course    75 y.o. male with h/o DM, vasculopathy and CKD with chronic edema presents with worsening b/l LE swelling R>L with redness and pain to RLE below knee. Has h/o peripheral edema and is on intermittent lasix for swelling. Dopplers negative for DVT today, Renal function improved from comparison 2 months ago, will start back on lasix and f/u with PCP in next 2 days for recheck. Pt is on doxycycline so will broaden with keflex for better strep coverage. Possible this represents venous stasis dermatitis. No signs of systemic infection currently. Plan to follow up with PCP as needed and return precautions discussed for worsening or new concerning symptoms.   Final Clinical Impressions(s) / ED Diagnoses   Final diagnoses:  Peripheral edema  Cellulitis of right lower extremity    New Prescriptions New Prescriptions   No  medications on file     Leo Grosser, MD 10/25/15 1744

## 2015-10-30 ENCOUNTER — Other Ambulatory Visit: Payer: Self-pay | Admitting: *Deleted

## 2015-10-30 DIAGNOSIS — I739 Peripheral vascular disease, unspecified: Secondary | ICD-10-CM

## 2015-10-30 DIAGNOSIS — Z48812 Encounter for surgical aftercare following surgery on the circulatory system: Secondary | ICD-10-CM

## 2015-11-01 ENCOUNTER — Ambulatory Visit (INDEPENDENT_AMBULATORY_CARE_PROVIDER_SITE_OTHER)
Admission: RE | Admit: 2015-11-01 | Discharge: 2015-11-01 | Disposition: A | Discharge status: Home / Self Care | Payer: Medicare Other | Source: Ambulatory Visit | Attending: Vascular Surgery | Admitting: Vascular Surgery

## 2015-11-01 ENCOUNTER — Ambulatory Visit (INDEPENDENT_AMBULATORY_CARE_PROVIDER_SITE_OTHER): Payer: Medicare Other | Admitting: Vascular Surgery

## 2015-11-01 ENCOUNTER — Ambulatory Visit (HOSPITAL_COMMUNITY)
Admission: RE | Admit: 2015-11-01 | Discharge: 2015-11-01 | Disposition: A | Discharge status: Home / Self Care | Payer: Medicare Other | Source: Ambulatory Visit | Attending: Vascular Surgery | Admitting: Vascular Surgery

## 2015-11-01 ENCOUNTER — Encounter: Payer: Self-pay | Admitting: Vascular Surgery

## 2015-11-01 VITALS — BP 107/70 | HR 107 | Temp 97.2°F | Resp 20 | Ht 73.0 in | Wt 200.0 lb

## 2015-11-01 DIAGNOSIS — I739 Peripheral vascular disease, unspecified: Secondary | ICD-10-CM | POA: Diagnosis present

## 2015-11-01 DIAGNOSIS — Z48812 Encounter for surgical aftercare following surgery on the circulatory system: Secondary | ICD-10-CM

## 2015-11-01 LAB — VAS US ABI WITH/WO TBI
RIGHT POST TIB DIST SYS: 53 cm/s
RPOPDPSV: -76 cm/s
RPOPPPSV: 123 cm/s
RSFPPSV: 51 cm/s

## 2015-11-01 NOTE — Progress Notes (Signed)
HISTORY AND PHYSICAL  CC:  F/u bypass graft Referring Provider:  [name], *  HPI: This is a 75 y.o. 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  His amputation site has healed as well as all of his incisions.  His main complaint today is swelling in the right leg.  He states that he was in the ER at [facility] last week for swelling of his leg.  He states that "it was so big he thought it was [place] bust".  He was given Lasix, which has now dropped his blood pressure.  He says that he spoke to his medical doctor last evening and he was told to stop taking his Lisinopril    He is followed by Dr. [name] for possible right knee infection.  He states he was taken off the abx and that's when he got significant swelling in his leg.  He has since been placed back on abx.  He has an appointment with Dr. Elmyra Ricks later this month about his knee. He states that he takes two oxycodone a day and this was prescribed by his PCP.     He takes a daily aspirin and is on Plavix.  He was taking lisinopril, but has discontinued this due to the drop in blood pressure.  He is on oral agents for his diabetes.  He is on a statin for cholesterol management.   Past Medical History:  Diagnosis Date  . Anemia    low iron  . ARF (acute renal failure) ([place]) 12/2014  . Arthritis   . Bradycardia   . Chronic bronchitis ([place])   . CKD (chronic kidney disease), stage III   . Colon polyp   . Depression    occassional.  Situational  . GERD (gastroesophageal reflux disease)   . Gout   . Gout   . History of blood transfusion   . History of GI bleed 2007  . Hypercholesteremia   . Hypertension   . Neuropathy ([place])   . PAD (peripheral artery disease) ([place])   . Pneumonia    denies  . Tobacco abuse disorder 03/10/2012  . Type II diabetes mellitus ([place])   . Ulcer disease     Past Surgical History:  Procedure Laterality Date  .  AMPUTATION Right 02/07/2015   Procedure: GREAT TOE RAY AMPUTATION RIGHT ;  Surgeon: Angelia [name], MD;  Location: [place];  Service: Vascular;  Laterality: Right;  . AMPUTATION TOE Right 03/21/2015   Procedure: AMPUTATION TOE-RIGHT SECOND TOE;  Surgeon: Angelia [name], MD;  Location: [place];  Service: Vascular;  Laterality: Right;  . ANTERIOR CERVICAL DECOMP/DISCECTOMY FUSION  ~ 2007  . ANTERIOR LAT LUMBAR FUSION Left 01/19/2014   Procedure: LATERAL INTERBODY FUSION 1 LEVEL;  Surgeon: Sinclair Ship, MD;  Location: [place];  Service: Orthopedics;  Laterality: Left;  Left lumbar 3-4 lateral interbody fusion with instrumentation, allograft  . APPLICATION OF WOUND VAC Right 02/07/2015   Procedure:  APPLICATION OF WOUND VAC right great toe amputation site.;  Surgeon: Angelia [name], MD;  Location: [place];  Service: Vascular;  Laterality: Right;  . APPLICATION OF WOUND VAC Right 03/21/2015   Procedure: APPLICATION OF WOUND VAC RIGHT FOOT;  Surgeon: Angelia [name], MD;  Location: [place];  Service: Vascular;  Laterality: Right;  . BACK SURGERY    . BYPASS GRAFT FEMORAL-PERONEAL Right 02/07/2015   Procedure:  BYPASS GRAFT FEMORAL-PERONEAL Trunk WITH VEIN graft  right leg.;  Surgeon: Angelia [name], MD;  Location: [place];  Service: Vascular;  Laterality: Right;  . COLONOSCOPY    . ENDARTERECTOMY TIBIOPERONEAL Right 02/07/2015   Procedure: ENDARTERECTOMY TIBIOPERONEAL;  Surgeon: Angelia [name], MD;  Location: [place];  Service: Vascular;  Laterality: Right;  . EXCISIONAL TOTAL KNEE ARTHROPLASTY WITH ANTIBIOTIC SPACERS Right 04/14/2015   Procedure: RIGHT KNEE [place] OUT AND PLACEMENT OF DRAINS;  Surgeon: Frederik Pear, MD;  Location: [place];  Service: Orthopedics;  Laterality: Right;  . EYE SURGERY Bilateral    cataract surgery with lens implant  . HERNIA REPAIR  ~ 2007   UHR (02/04/2012)  . ILIAC ARTERY STENT Left    left common/notes (02/04/2012)  . INTRAOPERATIVE ARTERIOGRAM  Right 02/07/2015   Procedure: INTRA OPERATIVE ARTERIOGRAM;  Surgeon: Angelia [name], MD;  Location: [place];  Service: Vascular;  Laterality: Right;  . IRRIGATION AND DEBRIDEMENT KNEE Right 04/14/2015   Procedure: IRRIGATION AND DEBRIDEMENT KNEE;  Surgeon: Frederik Pear, MD;  Location: [place];  Service: Orthopedics;  Laterality: Right;  . LOWER EXTREMITY ANGIOGRAM N/A 02/04/2012   Procedure: LOWER EXTREMITY ANGIOGRAM;  Surgeon: Lorretta Harp, MD;  Location[facility] C[facility] LAB;  Service: Cardiovascular;  Laterality: N/A;  . LUMBAR LAMINECTOMY  11/14  . PATCH ANGIOPLASTY Right 02/07/2015   Procedure: Vein PATCH ANGIOPLASTY to tibioperoneal trunk;  Surgeon: Angelia [name], MD;  Location: [place];  Service: Vascular;  Laterality: Right;  . PERCUTANEOUS STENT INTERVENTION Left 02/04/2012   Procedure: PERCUTANEOUS STENT INTERVENTION;  Surgeon: Lorretta Harp, MD;  Location: [facility] CATH LAB;  Service: Cardiovascular;  Laterality: Left;  lt ext iliac stent  . PERIPHERAL VASCULAR CATHETERIZATION N/A 02/02/2015   Procedure: Abdominal Aortogram w/Lower Extremity;  Surgeon: Angelia [name], MD;  Location: [place] [facility] LAB;  Service: Cardiovascular;  Laterality: N/A;  . POLYPECTOMY    . REPLACEMENT TOTAL KNEE Right   . SPLENECTOMY  ~ 1957  . TONSILLECTOMY  ~ 1947  . TOTAL KNEE ARTHROPLASTY Right 02/03/2013   Procedure: TOTAL KNEE ARTHROPLASTY;  Surgeon: [name], MD;  Location: [place];  Service: Orthopedics;  Laterality: Right;  . UPPER ENDOSCOPY W/ SCLEROTHERAPY  ~ 2007  . WOUND DEBRIDEMENT Right 03/21/2015   Procedure: DEBRIDEMENT WOUND of RIGHT FOOT;  Surgeon: Angelia [name], MD;  Location: [facility] OR;  Service: Vascular;  Laterality: Right;    No Known Allergies  Current Outpatient Prescriptions  Medication Sig Dispense Refill  . allopurinol (ZYLOPRIM) 300 MG tablet Take 300 mg by mouth at bedtime.     [name] aspirin EC 325 MG tablet Take 1 tablet (325 mg total) by mouth 2 (two) times daily. 30  tablet 0  . cephALEXin (KEFLEX) 500 MG capsule Take 1 capsule (500 mg total) by mouth 3 (three) times daily. 21 capsule 0  . clopidogrel (PLAVIX) 75 MG tablet Take 75 mg by mouth every other day.    . docusate sodium (COLACE) 100 MG capsule Take 100 mg by mouth 2 (two) times daily.    [name] doxycycline (MONODOX) 100 MG capsule Take 1 capsule (100 mg total) by mouth 2 (two) times daily. 180 capsule 3  . gabapentin (NEURONTIN) 600 MG tablet Take 1,200 mg by mouth 2 (two) times daily.     [name] glimepiride (AMARYL) 4 MG tablet Take 4 mg by mouth 2 (two) times daily.     . indomethacin (INDOCIN) 25 MG capsule Take 50 mg by  mouth 2 (two) times daily as needed for mild pain.    [name] lisinopril (PRINIVIL,ZESTRIL) 20 MG tablet Take 20 mg by mouth daily.    [name] lovastatin (MEVACOR) 40 MG tablet Take 40 mg by mouth at bedtime.    [name] oxyCODONE-acetaminophen (PERCOCET/ROXICET) 5-325 MG tablet Take 1 tablet by mouth 2 (two) times daily as needed for moderate pain.     . pantoprazole (PROTONIX) 40 MG tablet Take 40 mg by mouth daily.    . pioglitazone (ACTOS) 30 MG tablet Take 30 mg by mouth daily.    . predniSONE (DELTASONE) 10 MG tablet Take 20 mg by mouth daily with breakfast.     . furosemide (LASIX) 80 MG tablet Take 1 tablet (80 mg total) by mouth daily. 5 tablet 0   No current facility-administered medications for this visit.    Facility-Administered Medications Ordered in Other Visits  Medication Dose Route Frequency Provider Last Rate Last Dose  . 0.9 %  sodium chloride infusion   Intravenous Continuous Angelia [name], MD      . Chlorhexidine Gluconate Cloth 2 % PADS 6 each  6 each Topical Once Angelia [name], MD        Family History  Problem Relation Age of Onset  . Colon cancer Paternal Uncle     Uncle  . Heart disease Father   . Hypertension Father   . Heart attack Father   . Heart disease Mother   . Diabetes Mother   . Hypertension Mother     Social History   Social History  .  Marital status: Legally Separated    Spouse name: N/A  . Number of children: 2  . Years of education: N/A   Occupational History  . retired   . cattle farm     owner   Social History Main Topics  . Smoking status: Current Every Day Smoker    Packs/day: 1.00    Years: 58.00    Types: Cigarettes  . Smokeless tobacco: Former Systems developer    Types: Chew  . Alcohol use No  . Drug use: No  . Sexual activity: No   Other Topics Concern  . Not on file   Social History Narrative   Patient lives at home with his spouse.   Caffeine Use: 2 cups daily   REVIEW OF SYSTEMS:   [X]  denotes positive finding, [ ]  denotes negative finding Cardiac  Comments:  Chest pain or chest pressure:    Shortness of breath upon exertion:    Short of breath when lying flat:    Irregular heart rhythm:        Vascular    Pain in calf, thigh, or hip brought on by ambulation:    Pain in feet at night that wakes you up from your sleep:     Blood clot in your veins:    Leg swelling:  x       Pulmonary    Oxygen at home:    Productive cough:     Wheezing:         Neurologic    Sudden weakness in arms or legs:     Sudden numbness in arms or legs:     Sudden onset of difficulty speaking or slurred speech:    Temporary loss of vision in one eye:     Problems with dizziness:         Gastrointestinal    Blood in stool:     Vomited blood:  Genitourinary    Burning when urinating:     Blood in urine:        Psychiatric    Major depression:         Hematologic    Bleeding problems:    Problems with blood clotting too easily:        Skin    Rashes or ulcers:        Constitutional    Fever or chills:      PHYSICAL EXAMINATION:  Vitals:   11/01/15 1442  BP: 107/70  Pulse: (!) 107  Resp: 20  Temp: 97.2 F (36.2 C)   Body mass index is 26.39 kg/m.  General:  WDWN in NAD; vital signs documented above Gait: slow and with a cane HENT: WNL, normocephalic Pulmonary: normal non-labored  breathing , without Rales, rhonchi,  wheezing Cardiac: regular HR, without  Murmurs, rubs or gallops; without carotid bruits Abdomen: soft, NT, no masses Skin: without rashes Vascular Exam/Pulses:  Right Left  Radial 2+ (normal) 2+ (normal)  Femoral 2+ (normal) 2+ (normal)  Popliteal 2+ (normal) Unable to palpate  DP Brisk monophasic doppler signal Monophasic doppler signal  PT Brisk doppler signal Monophasic doppler signal   Extremities: without ischemic changes, without Gangrene , without cellulitis; without open wounds; well healed scars on right leg.  Well healed ray amp site.  + swelling right leg Musculoskeletal: no muscle wasting or atrophy  Neurologic: A&O X 3;  No focal weakness or paresthesias are detected Psychiatric:  The pt has Normal affect.   Non-Invasive Vascular Imaging:   ABI's 11/01/15: Right:  1.02 Left:  0.71  Previous ABI's 08/30/15: Right.  1.02 Left:  0.59  Lower extremity arterial duplex 11/01/15: Patent right leg bypass graft with no evidence of stenosis noted, however, no flow in the mid bypass graft.  No internal narrowing noted within the bypass graft or anastomosis.   Pt meds includes: Statin:  Yes.   Beta Blocker:  No. Aspirin:  Yes.   ACEI:  No. ARB:  No. Other Antiplatelet/Anticoagulant:  No.   ASSESSMENT/PLAN:: 75 y.o. male s/p right femoral to tibial bypass in January 2017 by Dr. Scot Dock.  -pt is doing well as far as the bypass graft goes-he has had significant swelling in the right lower leg and was in the ER last week and given diuretic.  He has since been taken off his ACEI due to low blood pressure.  -arterial duplex today says there is no flow in the mid portion of the graft, however, he has brisk monophasic doppler signals right DP/PT and Dr. Scot Dock is able to palpate a popliteal pulse.  -he has a f/u appointment with Dr. Elmyra Ricks about his right knee pain at the end of the month.  He is on abx for his right knee. -will have the pt  return in 3 months for repeat ABI's and arterial duplex.  He will call sooner if he needs [place]. - we discussed smoking cessation today-pt is not really interested in stopping at this time. -continue statin/aspirin  Leontine Locket, PA-C [facility] [phone]  Clinic MD:  Pt seen and examined in conjunction with Dr. Scot Dock  I have interviewed the patient and examined the patient. I agree with the findings by the PA. I reviewed the duplex scan with the technologist. There was diastolic flow in the graft above the area that could not be well visualized. In addition he has a palpable popliteal pulse and brisk Doppler signals in the right  foot. So I think his graft is patent. I'll see him back in 3 months with a follow up duplex and ABIs. If he may require reoperative surgery on his knee then we may need to consider arteriography to evaluate the graft. However based on his exam today and the graft is patent.  Gae Gallop, MD [phone]

## 2016-01-02 ENCOUNTER — Emergency Department (HOSPITAL_COMMUNITY)
Admission: EM | Admit: 2016-01-02 | Discharge: 2016-01-02 | Disposition: A | Discharge status: Home / Self Care | Payer: Medicare Other | Attending: Emergency Medicine | Admitting: Emergency Medicine

## 2016-01-02 ENCOUNTER — Other Ambulatory Visit: Payer: Self-pay | Admitting: Family Medicine

## 2016-01-02 ENCOUNTER — Encounter (HOSPITAL_COMMUNITY): Payer: Self-pay

## 2016-01-02 DIAGNOSIS — F17210 Nicotine dependence, cigarettes, uncomplicated: Secondary | ICD-10-CM | POA: Insufficient documentation

## 2016-01-02 DIAGNOSIS — Z7984 Long term (current) use of oral hypoglycemic drugs: Secondary | ICD-10-CM | POA: Insufficient documentation

## 2016-01-02 DIAGNOSIS — Z7982 Long term (current) use of aspirin: Secondary | ICD-10-CM | POA: Insufficient documentation

## 2016-01-02 DIAGNOSIS — Z96651 Presence of right artificial knee joint: Secondary | ICD-10-CM | POA: Insufficient documentation

## 2016-01-02 DIAGNOSIS — I2510 Atherosclerotic heart disease of native coronary artery without angina pectoris: Secondary | ICD-10-CM | POA: Diagnosis not present

## 2016-01-02 DIAGNOSIS — I5032 Chronic diastolic (congestive) heart failure: Secondary | ICD-10-CM | POA: Insufficient documentation

## 2016-01-02 DIAGNOSIS — E1140 Type 2 diabetes mellitus with diabetic neuropathy, unspecified: Secondary | ICD-10-CM | POA: Insufficient documentation

## 2016-01-02 DIAGNOSIS — M545 Low back pain: Secondary | ICD-10-CM | POA: Diagnosis present

## 2016-01-02 DIAGNOSIS — N179 Acute kidney failure, unspecified: Secondary | ICD-10-CM

## 2016-01-02 DIAGNOSIS — N183 Chronic kidney disease, stage 3 (moderate): Secondary | ICD-10-CM | POA: Diagnosis not present

## 2016-01-02 DIAGNOSIS — I10 Essential (primary) hypertension: Secondary | ICD-10-CM

## 2016-01-02 DIAGNOSIS — M5441 Lumbago with sciatica, right side: Secondary | ICD-10-CM | POA: Diagnosis not present

## 2016-01-02 DIAGNOSIS — I130 Hypertensive heart and chronic kidney disease with heart failure and stage 1 through stage 4 chronic kidney disease, or unspecified chronic kidney disease: Secondary | ICD-10-CM | POA: Insufficient documentation

## 2016-01-02 DIAGNOSIS — N189 Chronic kidney disease, unspecified: Principal | ICD-10-CM

## 2016-01-02 DIAGNOSIS — R609 Edema, unspecified: Secondary | ICD-10-CM | POA: Diagnosis not present

## 2016-01-02 LAB — ACETAMINOPHEN LEVEL

## 2016-01-02 LAB — COMPREHENSIVE METABOLIC PANEL
ALT: 7 U/L — ABNORMAL LOW (ref 17–63)
ANION GAP: 11 (ref 5–15)
AST: 27 U/L (ref 15–41)
Albumin: 3.9 g/dL (ref 3.5–5.0)
Alkaline Phosphatase: 76 U/L (ref 38–126)
BILIRUBIN TOTAL: 0.8 mg/dL (ref 0.3–1.2)
BUN: 40 mg/dL — ABNORMAL HIGH (ref 6–20)
CHLORIDE: 105 mmol/L (ref 101–111)
CO2: 27 mmol/L (ref 22–32)
Calcium: 9.6 mg/dL (ref 8.9–10.3)
Creatinine, Ser: 1.77 mg/dL — ABNORMAL HIGH (ref 0.61–1.24)
GFR, EST AFRICAN AMERICAN: 41 mL/min — AB (ref >60)
GFR, EST NON AFRICAN AMERICAN: 36 mL/min — AB (ref >60)
Glucose, Bld: 137 mg/dL — ABNORMAL HIGH (ref 65–99)
POTASSIUM: 4.8 mmol/L (ref 3.5–5.1)
Sodium: 143 mmol/L (ref 135–145)
TOTAL PROTEIN: 7.5 g/dL (ref 6.5–8.1)

## 2016-01-02 LAB — URINALYSIS, ROUTINE W REFLEX MICROSCOPIC
BILIRUBIN URINE: NEGATIVE
GLUCOSE, UA: NEGATIVE mg/dL
HGB URINE DIPSTICK: NEGATIVE
KETONES UR: NEGATIVE mg/dL
LEUKOCYTES UA: NEGATIVE
Nitrite: NEGATIVE
PROTEIN: NEGATIVE mg/dL
Specific Gravity, Urine: 1.006 (ref 1.005–1.030)
pH: 7 (ref 5.0–8.0)

## 2016-01-02 LAB — CBC WITH DIFFERENTIAL/PLATELET
BASOS ABS: 0 10*3/uL (ref 0.0–0.1)
Basophils Relative: 0 %
EOS PCT: 4 %
Eosinophils Absolute: 0.3 10*3/uL (ref 0.0–0.7)
HCT: 33.1 % — ABNORMAL LOW (ref 39.0–52.0)
Hemoglobin: 10.9 g/dL — ABNORMAL LOW (ref 13.0–17.0)
LYMPHS PCT: 33 %
Lymphs Abs: 2.2 10*3/uL (ref 0.7–4.0)
MCH: 33.2 pg (ref 26.0–34.0)
MCHC: 32.9 g/dL (ref 30.0–36.0)
MCV: 100.9 fL — AB (ref 78.0–100.0)
MONO ABS: 0.3 10*3/uL (ref 0.1–1.0)
MONOS PCT: 4 %
Neutro Abs: 3.9 10*3/uL (ref 1.7–7.7)
Neutrophils Relative %: 59 %
PLATELETS: 155 10*3/uL (ref 150–400)
RBC: 3.28 MIL/uL — ABNORMAL LOW (ref 4.22–5.81)
RDW: 16.7 % — AB (ref 11.5–15.5)
WBC: 6.7 10*3/uL (ref 4.0–10.5)

## 2016-01-02 LAB — TROPONIN I

## 2016-01-02 LAB — BRAIN NATRIURETIC PEPTIDE: B Natriuretic Peptide: 41.6 pg/mL (ref 0.0–100.0)

## 2016-01-02 MED ORDER — HYDROMORPHONE HCL 1 MG/ML IJ SOLN
1.0000 mg | Freq: Once | INTRAMUSCULAR | Status: AC
  Administered 2016-01-02: 1 mg via INTRAVENOUS
  Filled 2016-01-02: qty 1

## 2016-01-02 NOTE — ED Notes (Signed)
Pt reports chronic back pain increasing x 4 days.  Pt reports that he has been seen by his "back doctor" and had xrays completed yesterday.  Sts "he told me that he can't do anything.  I've been popping oxycodone like candy.  He said surgery wouldn't help."  Pt reports having lab work x 5 days ago.  Sts "they were checking for how hemoglobin."  Sts his doctor sent him here for pain medication and "to get checked out."

## 2016-01-02 NOTE — ED Provider Notes (Signed)
[facility] DEPT Provider Note   CSN: [id number] Arrival date & time: 01/02/16  1620     History   Chief Complaint Chief Complaint  Patient presents with  . Back Pain  . Abnormal Lab    HPI Marc [name] is a 75 y.o. male.  HPI Patient presents with back pain. Some confusion over the story is had reports from different people but apparently has had increasing back pain on his chronic pack pain for the last few days. Saw his primary care doctor did some basic labs on Thursday, with today being Tuesday. Patient states he's been taking his Percocet without relief. He states his been taking it like M&M's. The pain is in his low back and severe. States he has 2 bulging disc. A poorly saw his back doctor also said they couldn't do anything and surgery wouldn't help. Initial report from EMS was that they sent him in after his doctor went to see him today because he had a renal failure. Patient states he was sent in because he was in so much pain. EMS later stated they thought he would end up involuntary committed. Patient did not mention any suicidal thoughts to me. Nursing question more and stated that he had thoughts that if he didn't get pain meds he could do something.   Past Medical History:  Diagnosis Date  . Anemia    low iron  . ARF (acute renal failure) ([place]) 12/2014  . Arthritis   . Bradycardia   . Chronic bronchitis ([place])   . CKD (chronic kidney disease), stage III   . Colon polyp   . Depression    occassional.  Situational  . GERD (gastroesophageal reflux disease)   . Gout   . Gout   . History of blood transfusion   . History of GI bleed 2007  . Hypercholesteremia   . Hypertension   . Neuropathy ([place])   . PAD (peripheral artery disease) ([place])   . Pneumonia    denies  . Tobacco abuse disorder 03/10/2012  . Type II diabetes mellitus ([place])   . Ulcer disease     Patient Active Problem List   Diagnosis Date Noted  . Antibiotic long-term use 09/14/2015  .  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 Right foot infection 04/13/2015  . Pressure ulcer 03/20/2015  . Cellulitis of right lower extremity   . Chronic diastolic CHF (congestive heart failure) ([place])   . Chronic anemia 03/01/2015  . Type 2 diabetes mellitus with renal complication ([place]) 99991111  . Atherosclerosis of native arteries of extremities with gangrene, right leg ([place]) 02/07/2015  . Type 2 diabetes mellitus with right diabetic foot infection ([place])   . CAD (coronary artery disease), native coronary artery   . Ascending aortic aneurysm ([place])   . Mass of lower lobe of left lung   . Chronic kidney disease stage 3   . Peripheral vascular disease with claudication ([place])   . Gout 02/07/2013  . Hyperlipidemia 02/07/2013  . GERD (gastroesophageal reflux disease) 02/07/2013  . Type 2 diabetes mellitus with peripheral neuropathy (H[facility])   . Spinal stenosis of lumbar region 01/14/2013  . Tobacco abuse disorder 03/10/2012  . Bradycardia:  Normal condition for this patient. Previously worked up. 02/05/2012  . Hx of peptic ulcer 05/21/2010  . Hx of adenomatous colonic polyps 05/21/2010  . Hypertensive heart disease   . Lumbar disc disease     Past Surgical History:  Procedure Laterality Date  . AMPUTATION Right  02/07/2015   Procedure: GREAT TOE RAY AMPUTATION RIGHT ;  Surgeon: Angelia [name], MD;  Location: [facility];  Service: Vascular;  Laterality: Right;  . AMPUTATION TOE Right 03/21/2015   Procedure: AMPUTATION TOE-RIGHT SECOND TOE;  Surgeon: Angelia [name], MD;  Location: [facility];  Service: Vascular;  Laterality: Right;  . ANTERIOR CERVICAL DECOMP/DISCECTOMY FUSION  ~ 2007  . ANTERIOR LAT LUMBAR FUSION Left 01/19/2014   Procedure: LATERAL INTERBODY FUSION 1 LEVEL;  Surgeon: Sinclair Ship, MD;  Location: [facility];  Service: Orthopedics;  Laterality: Left;  Left lumbar 3-4 lateral interbody fusion with instrumentation, allograft  . APPLICATION OF WOUND VAC  Right 02/07/2015   Procedure:  APPLICATION OF WOUND VAC right great toe amputation site.;  Surgeon: Angelia [name], MD;  Location: [facility];  Service: Vascular;  Laterality: Right;  . APPLICATION OF WOUND VAC Right 03/21/2015   Procedure: APPLICATION OF WOUND VAC RIGHT FOOT;  Surgeon: Angelia [name], MD;  Location: [facility];  Service: Vascular;  Laterality: Right;  . BACK SURGERY    . BYPASS GRAFT FEMORAL-PERONEAL Right 02/07/2015   Procedure: BYPASS GRAFT FEMORAL-PERONEAL Trunk WITH VEIN graft  right leg.;  Surgeon: Angelia [name], MD;  Location: [facility];  Service: Vascular;  Laterality: Right;  . COLONOSCOPY    . ENDARTERECTOMY TIBIOPERONEAL Right 02/07/2015   Procedure: ENDARTERECTOMY TIBIOPERONEAL;  Surgeon: Angelia [name], MD;  Location: [facility];  Service: Vascular;  Laterality: Right;  . EXCISIONAL TOTAL KNEE ARTHROPLASTY WITH ANTIBIOTIC SPACERS Right 04/14/2015   Procedure: RIGHT KNEE [place] OUT AND PLACEMENT OF DRAINS;  Surgeon: Frederik Pear, MD;  Location: [facility];  Service: Orthopedics;  Laterality: Right;  . EYE SURGERY Bilateral    cataract surgery with lens implant  . HERNIA REPAIR  ~ 2007   UHR (02/04/2012)  . ILIAC ARTERY STENT Left    left common/notes (02/04/2012)  . INTRAOPERATIVE ARTERIOGRAM Right 02/07/2015   Procedure: INTRA OPERATIVE ARTERIOGRAM;  Surgeon: Angelia [name], MD;  Location: [facility];  Service: Vascular;  Laterality: Right;  . IRRIGATION AND DEBRIDEMENT KNEE Right 04/14/2015   Procedure: IRRIGATION AND DEBRIDEMENT KNEE;  Surgeon: Frederik Pear, MD;  Location: [facility];  Service: Orthopedics;  Laterality: Right;  . LOWER EXTREMITY ANGIOGRAM N/A 02/04/2012   Procedure: LOWER EXTREMITY ANGIOGRAM;  Surgeon: Lorretta Harp, MD;  Location[facility] C[facility];  Service: Cardiovascular;  Laterality: N/A;  . LUMBAR LAMINECTOMY  11/14  . PATCH ANGIOPLASTY Right 02/07/2015   Procedure: Vein PATCH ANGIOPLASTY to tibioperoneal trunk;  Surgeon: Angelia [name], MD;   Location: [facility];  Service: Vascular;  Laterality: Right;  . PERCUTANEOUS STENT INTERVENTION Left 02/04/2012   Procedure: PERCUTANEOUS STENT INTERVENTION;  Surgeon: Lorretta Harp, MD;  Location: [facility] CATH LAB;  Service: Cardiovascular;  Laterality: Left;  lt ext iliac stent  . PERIPHERAL VASCULAR CATHETERIZATION N/A 02/02/2015   Procedure: Abdominal Aortogram w/Lower Extremity;  Surgeon: Angelia [name], MD;  Location: [facility] CV LAB;  Service: Cardiovascular;  Laterality: N/A;  . POLYPECTOMY    . REPLACEMENT TOTAL KNEE Right   . SPLENECTOMY  ~ 1957  . TONSILLECTOMY  ~ 1947  . TOTAL KNEE ARTHROPLASTY Right 02/03/2013   Procedure: TOTAL KNEE ARTHROPLASTY;  Surgeon: [name], MD;  Location: [facility];  Service: Orthopedics;  Laterality: Right;  . UPPER ENDOSCOPY W/ SCLEROTHERAPY  ~ 2007  . WOUND DEBRIDEMENT Right 03/21/2015   Procedure: DEBRIDEMENT WOUND of RIGHT FOOT;  Surgeon: Angelia [name], MD;  [facility];  Service: Vascular;  Laterality: Right;       Home Medications    Prior to Admission medications   Medication Sig Start Date End Date Taking? Authorizing Provider  allopurinol (ZYLOPRIM) 300 MG tablet Take 300 mg by mouth at bedtime.    Yes Historical Provider, MD  aspirin EC 325 MG tablet Take 1 tablet (325 mg total) by mouth 2 (two) times daily. 04/17/15  Yes Leighton Parody, PA-C  atorvastatin (LIPITOR) 10 MG tablet Take 10 mg by mouth daily.   Yes Historical Provider, MD  bumetanide (BUMEX) 1 MG tablet Take 0.5 mg by mouth daily. Take 30 minutes prior to furosemide. 12/29/15  Yes Historical Provider, MD  clopidogrel (PLAVIX) 75 MG tablet Take 75 mg by mouth every other day.   Yes Historical Provider, MD  colchicine 0.6 MG tablet Take 0.6 mg by mouth daily.   Yes Historical Provider, MD  cyclobenzaprine (FLEXERIL) 5 MG tablet Take 5 mg by mouth 2 (two) times daily. 01/01/16  Yes Historical Provider, MD  divalproex (DEPAKOTE ER) 500 MG 24 hr tablet Take 500 mg by  mouth daily. 12/07/15  Yes Historical Provider, MD  docusate sodium (COLACE) 100 MG capsule Take 100 mg by mouth 2 (two) times daily.   Yes Historical Provider, MD  doxycycline (MONODOX) 100 MG capsule Take 1 capsule (100 mg total) by mouth 2 (two) times daily. 10/23/15  Yes [name], MD  gabapentin (NEURONTIN) 600 MG tablet Take 1,200 mg by mouth every 12 (twelve) hours.    Yes Historical Provider, MD  glimepiride (AMARYL) 4 MG tablet Take 8 mg by mouth daily with breakfast.    Yes Historical Provider, MD  lisinopril (PRINIVIL,ZESTRIL) 20 MG tablet Take 20 mg by mouth daily. 08/29/15  Yes Historical Provider, MD  oxyCODONE-acetaminophen (PERCOCET) 7.5-325 MG tablet Take 1 tablet by mouth every 6 (six) hours as needed (pain.).   Yes Historical Provider, MD  pantoprazole (PROTONIX) 40 MG tablet Take 40 mg by mouth daily. 08/29/15  Yes Historical Provider, MD  pioglitazone (ACTOS) 30 MG tablet Take 30 mg by mouth daily. 04/13/15  Yes Historical Provider, MD  predniSONE (DELTASONE) 10 MG tablet Take 10 mg by mouth daily with breakfast.    Yes Historical Provider, MD  Prenatal Vit-Fe Fumarate-FA (MULTIVITAMIN-PRENATAL) 27-0.8 MG TABS tablet Take 1 tablet by mouth daily at 12 noon.   Yes Historical Provider, MD  spironolactone (ALDACTONE) 25 MG tablet Take 25 mg by mouth 2 (two) times daily.   Yes Historical Provider, MD  furosemide (LASIX) 80 MG tablet Take 1 tablet (80 mg total) by mouth daily. 10/25/15 10/30/15  Leo Grosser, MD    Family History Family History  Problem Relation Age of Onset  . Colon cancer Paternal Uncle     Uncle  . Heart disease Father   . Hypertension Father   . Heart attack Father   . Heart disease Mother   . Diabetes Mother   . Hypertension Mother     Social History Social History  Substance Use Topics  . Smoking status: Current Every Day Smoker    Packs/day: 1.00    Years: 58.00    Types: Cigarettes  . Smokeless tobacco: Former Systems developer    Types: Chew  . Alcohol  use No     Allergies   Patient has no known allergies.   Review of Systems Review of Systems  Constitutional: Negative for appetite change.  HENT: Negative for congestion.   Respiratory: Negative  for choking.   Cardiovascular: Positive for leg swelling.  Gastrointestinal: Negative for abdominal pain.  Genitourinary: Negative for dysuria.  Musculoskeletal: Positive for back pain.  Neurological: Negative for weakness and numbness.  Psychiatric/Behavioral: Negative for confusion.     Physical Exam Updated Vital Signs BP (!) 147/108 (BP Location: Right Arm)   Pulse 85   Temp 98.7 F (37.1 C) (Oral)   Resp 16   SpO2 98%   Physical Exam  Constitutional: He appears well-developed.  HENT:  Head: Atraumatic.  Cardiovascular: Normal rate.   Pulmonary/Chest: Effort normal.  Abdominal: Soft.  Musculoskeletal: He exhibits edema and tenderness.  Lumbar midline tenderness. Pain with straight leg raise bilaterally. Moderate edema bilateral lower legs. Sensation intact over feet. Does have previous cavitation first to second toe on right foot.  Neurological: He is alert.  Skin: Skin is warm. Capillary refill takes less than 2 seconds.     ED Treatments / Results  Labs (all labs ordered are listed, but only abnormal results are displayed) Labs Reviewed  CBC WITH DIFFERENTIAL/PLATELET - Abnormal; Notable for the following:       Result Value   RBC 3.28 (*)    Hemoglobin 10.9 (*)    HCT 33.1 (*)    MCV 100.9 (*)    RDW 16.7 (*)    All other components within normal limits  URINALYSIS, ROUTINE W REFLEX MICROSCOPIC - Abnormal; Notable for the following:    Color, Urine STRAW (*)    All other components within normal limits  COMPREHENSIVE METABOLIC PANEL - Abnormal; Notable for the following:    Glucose, Bld 137 (*)    BUN 40 (*)    Creatinine, Ser 1.77 (*)    ALT 7 (*)    GFR calc non Af Amer 36 (*)    GFR calc Af Amer 41 (*)    All other components within normal limits    ACETAMINOPHEN LEVEL - Abnormal; Notable for the following:    Acetaminophen (Tylenol), Serum <10 (*)    All other components within normal limits  TROPONIN I  BRAIN NATRIURETIC PEPTIDE    EKG  EKG Interpretation None       Radiology No results found.  Procedures Procedures (including critical care time)  Medications Ordered in ED Medications  HYDROmorphone (DILAUDID) injection 1 mg (1 mg Intravenous Given 01/02/16 1722)     Initial Impression / Assessment and Plan / ED Course  I have reviewed the triage vital signs and the nursing notes.  Pertinent labs & imaging results that were available during my care of the patient were reviewed by me and considered in my medical decision making (see chart for details).  Clinical Course     Patient with back pain.  Has seen his back surgeon for the same. Also swelling in his legs. Has been managed by his primary care doctor for it. Pain improved after IV Dilaudid. Does not want steroids Will follow-up with primary care and or throat. Labs are generally at baseline.  Final Clinical Impressions(s) / ED Diagnoses   Final diagnoses:  Bilateral low back pain with right-sided sciatica, unspecified chronicity  Peripheral edema    New Prescriptions Discharge Medication List as of 01/02/2016  8:34 PM       Davonna Belling, MD 01/02/16 2352

## 2016-01-02 NOTE — ED Notes (Signed)
Bed: WA02 Expected date:  Expected time:  Means of arrival:  Comments: EMS- 75yo M, abnormal labs/renal failure?

## 2016-01-02 NOTE — ED Triage Notes (Signed)
Patient here from home with complaints of lower chronic back pain. MD made home visit today and reported that patient made threats of suicide. States that she would like the patient to be medically cleared.

## 2016-01-12 ENCOUNTER — Other Ambulatory Visit: Payer: Medicare Other

## 2016-01-19 ENCOUNTER — Other Ambulatory Visit: Payer: Medicare Other

## 2016-01-24 ENCOUNTER — Other Ambulatory Visit: Payer: Self-pay | Admitting: Physical Medicine and Rehabilitation

## 2016-01-24 DIAGNOSIS — M5136 Other intervertebral disc degeneration, lumbar region: Secondary | ICD-10-CM

## 2016-01-25 ENCOUNTER — Ambulatory Visit
Admission: RE | Admit: 2016-01-25 | Discharge: 2016-01-25 | Disposition: A | Discharge status: Home / Self Care | Payer: Medicare Other | Source: Ambulatory Visit | Attending: Family Medicine | Admitting: Family Medicine

## 2016-01-25 ENCOUNTER — Encounter: Payer: Self-pay | Admitting: Vascular Surgery

## 2016-01-25 DIAGNOSIS — I10 Essential (primary) hypertension: Secondary | ICD-10-CM

## 2016-01-25 DIAGNOSIS — N189 Chronic kidney disease, unspecified: Principal | ICD-10-CM

## 2016-01-25 DIAGNOSIS — N179 Acute kidney failure, unspecified: Secondary | ICD-10-CM

## 2016-01-29 HISTORY — PX: CATARACT EXTRACTION W/ INTRAOCULAR LENS  IMPLANT, BILATERAL: SHX1307

## 2016-01-31 ENCOUNTER — Ambulatory Visit
Admission: RE | Admit: 2016-01-31 | Discharge: 2016-01-31 | Disposition: A | Discharge status: Home / Self Care | Payer: Medicare Other | Source: Ambulatory Visit | Attending: Physical Medicine and Rehabilitation | Admitting: Physical Medicine and Rehabilitation

## 2016-01-31 DIAGNOSIS — M5136 Other intervertebral disc degeneration, lumbar region: Secondary | ICD-10-CM

## 2016-01-31 MED ORDER — DIAZEPAM 5 MG PO TABS
5.0000 mg | ORAL_TABLET | Freq: Once | ORAL | Status: AC
  Administered 2016-01-31: 5 mg via ORAL

## 2016-01-31 MED ORDER — IOPAMIDOL (ISOVUE-M 200) INJECTION 41%
1.0000 mL | Freq: Once | INTRAMUSCULAR | Status: AC
  Administered 2016-01-31: 1 mL via EPIDURAL

## 2016-01-31 MED ORDER — METHYLPREDNISOLONE ACETATE 40 MG/ML INJ SUSP (RADIOLOG
120.0000 mg | Freq: Once | INTRAMUSCULAR | Status: AC
  Administered 2016-01-31: 120 mg via EPIDURAL

## 2016-01-31 NOTE — Discharge Instructions (Signed)
Post Procedure Spinal Discharge Instruction Sheet  1. You may resume a regular diet and any medications that you routinely take (including pain medications).  2. No driving day of procedure.  3. Light activity throughout the rest of the day.  Do not do any strenuous work, exercise, bending or lifting.  The day following the procedure, you can resume normal physical activity but you should refrain from exercising or physical therapy for at least three days thereafter.   Common Side Effects:   Headaches- take your usual medications as directed by your physician.  Increase your fluid intake.  Caffeinated beverages may be helpful.  Lie flat in bed until your headache resolves.   Restlessness or inability to sleep- you may have trouble sleeping for the next few days.  Ask your referring physician if you need any medication for sleep.   Facial flushing or redness- should subside within a few days.   Increased pain- a temporary increase in pain a day or two following your procedure is not unusual.  Take your pain medication as prescribed by your referring physician.   Leg cramps  Please contact our office at [phone] for the following symptoms:  Fever greater than 100 degrees.  Headaches unresolved with medication after 2-3 days.  Increased swelling, pain, or redness at injection site.  Thank you for visiting our office.   You may resume Clopidogrel/Plavix today.

## 2016-02-02 ENCOUNTER — Other Ambulatory Visit: Payer: Self-pay

## 2016-02-02 DIAGNOSIS — I739 Peripheral vascular disease, unspecified: Secondary | ICD-10-CM

## 2016-02-07 ENCOUNTER — Encounter: Payer: Self-pay | Admitting: Vascular Surgery

## 2016-02-07 ENCOUNTER — Ambulatory Visit (INDEPENDENT_AMBULATORY_CARE_PROVIDER_SITE_OTHER)
Admission: RE | Admit: 2016-02-07 | Discharge: 2016-02-07 | Disposition: A | Discharge status: Home / Self Care | Payer: Medicare Other | Source: Ambulatory Visit | Attending: Vascular Surgery | Admitting: Vascular Surgery

## 2016-02-07 ENCOUNTER — Ambulatory Visit (INDEPENDENT_AMBULATORY_CARE_PROVIDER_SITE_OTHER): Payer: Medicare Other | Admitting: Vascular Surgery

## 2016-02-07 ENCOUNTER — Ambulatory Visit (HOSPITAL_COMMUNITY)
Admission: RE | Admit: 2016-02-07 | Discharge: 2016-02-07 | Disposition: A | Discharge status: Home / Self Care | Payer: Medicare Other | Source: Ambulatory Visit | Attending: Vascular Surgery | Admitting: Vascular Surgery

## 2016-02-07 VITALS — BP 170/66 | HR 91 | Temp 97.7°F | Resp 20 | Ht 73.0 in | Wt 201.0 lb

## 2016-02-07 DIAGNOSIS — I739 Peripheral vascular disease, unspecified: Secondary | ICD-10-CM

## 2016-02-07 NOTE — Progress Notes (Signed)
Patient name: Marc [name] MRN: [id number] DOB: 16-Sep-1940 Sex: male  REASON FOR VISIT: Follow up of peripheral vascular disease.  HPI: Marc [name] is a 76 y.o. male who is status post a right lower extremity bypass in 2017. On 02/07/2015 he had a right common femoral artery to tibial peroneal trunk bypass with a vein graft. 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 He had some chronic [place] was swelling in the right leg and is also had a possible right knee infection which is followed by orthopedics. I last saw him on 8-17. At that time his bypass graft was patent with an ABI of 100% on the right. He had an ABI of 59% on the left. He was set up her 3 month follow up visit.  Since I saw him last, he denies any significant claudication although I think his activities fairly limited. He denies any rest pain. He's made some changes to his medications which he felt was causing his leg swelling in his leg swelling on the right has improved.  He does continue to smoke 1-2 packs per day.  Past Medical History:  Diagnosis Date  . Anemia    low iron  . ARF (acute renal failure) ([place]) 12/2014  . Arthritis   . Bradycardia   . Chronic bronchitis ([place])   . CKD (chronic kidney disease), stage III   . Colon polyp   . Depression    occassional.  Situational  . GERD (gastroesophageal reflux disease)   . Gout   . Gout   . History of blood transfusion   . History of GI bleed 2007  . Hypercholesteremia   . Hypertension   . Neuropathy ([place])   . PAD (peripheral artery disease) ([place])   . Pneumonia    denies  . Tobacco abuse disorder 03/10/2012  . Type II diabetes mellitus ([place])   . Ulcer disease     Family History  Problem Relation Age of Onset  . Colon cancer Paternal Uncle     Uncle  . Heart disease Father   . Hypertension Father   . Heart attack Father   . Heart disease Mother   . Diabetes Mother   . Hypertension Mother     SOCIAL  HISTORY: Social History  Substance Use Topics  . Smoking status: Current Every Day Smoker    Packs/day: 1.00    Years: 58.00    Types: Cigarettes  . Smokeless tobacco: Former Systems developer    Types: Chew     Comment: 1-2 pks per day.   . Alcohol use No    No Known Allergies  Current Outpatient Prescriptions  Medication Sig Dispense Refill  . allopurinol (ZYLOPRIM) 300 MG tablet Take 300 mg by mouth at bedtime.     [name] aspirin EC 325 MG tablet Take 1 tablet (325 mg total) by mouth 2 (two) times daily. 30 tablet 0  . atorvastatin (LIPITOR) 10 MG tablet Take 10 mg by mouth daily.    . bumetanide (BUMEX) 1 MG tablet Take 0.5 mg by mouth daily. Take 30 minutes prior to furosemide.    . clopidogrel (PLAVIX) 75 MG tablet Take 75 mg by mouth every other day.    . colchicine 0.6 MG tablet Take 0.6 mg by mouth daily.    . cyclobenzaprine (FLEXERIL) 5 MG tablet Take 5 mg by mouth 2 (two) times daily.    . divalproex (DEPAKOTE ER) 500 MG 24 hr tablet Take 500 mg  by mouth daily.    [name] docusate sodium (COLACE) 100 MG capsule Take 100 mg by mouth 2 (two) times daily.    [name] doxycycline (MONODOX) 100 MG capsule Take 1 capsule (100 mg total) by mouth 2 (two) times daily. 180 capsule 3  . gabapentin (NEURONTIN) 600 MG tablet Take 1,200 mg by mouth every 12 (twelve) hours.     [name] glimepiride (AMARYL) 4 MG tablet Take 8 mg by mouth daily with breakfast.     . lisinopril (PRINIVIL,ZESTRIL) 20 MG tablet Take 20 mg by mouth daily.    [name] oxyCODONE-acetaminophen (PERCOCET) 7.5-325 MG tablet Take 1 tablet by mouth every 6 (six) hours as needed (pain.).    [name] pantoprazole (PROTONIX) 40 MG tablet Take 40 mg by mouth daily.    . Prenatal Vit-Fe Fumarate-FA (MULTIVITAMIN-PRENATAL) 27-0.8 MG TABS tablet Take 1 tablet by mouth daily at 12 noon.    . furosemide (LASIX) 80 MG tablet Take 1 tablet (80 mg total) by mouth daily. 5 tablet 0  . pioglitazone (ACTOS) 30 MG tablet Take 30 mg by mouth daily.    . predniSONE (DELTASONE) 10  MG tablet Take 10 mg by mouth daily with breakfast.     . spironolactone (ALDACTONE) 25 MG tablet Take 25 mg by mouth 2 (two) times daily.     No current facility-administered medications for this visit.    Facility-Administered Medications Ordered in Other Visits  Medication Dose Route Frequency Provider Last Rate Last Dose  . 0.9 %  sodium chloride infusion   Intravenous Continuous Angelia [name], MD      . Chlorhexidine Gluconate Cloth 2 % PADS 6 each  6 each Topical Once Angelia [name], MD        REVIEW OF SYSTEMS:  [X]  denotes positive finding, [ ]  denotes negative finding Cardiac  Comments:  Chest pain or chest pressure:    Shortness of breath upon exertion:    Short of breath when lying flat:    Irregular heart rhythm:        Vascular    Pain in calf, thigh, or hip brought on by ambulation:    Pain in feet at night that wakes you up from your sleep:     Blood clot in your veins:    Leg swelling:         Pulmonary    Oxygen at home:    Productive cough:     Wheezing:         Neurologic    Sudden weakness in arms or legs:     Sudden numbness in arms or legs:     Sudden onset of difficulty speaking or slurred speech:    Temporary loss of vision in one eye:     Problems with dizziness:         Gastrointestinal    Blood in stool:     Vomited blood:         Genitourinary    Burning when urinating:     Blood in urine:        Psychiatric    Major depression:         Hematologic    Bleeding problems:    Problems with blood clotting too easily:        Skin    Rashes or ulcers:        Constitutional    Fever or chills:      PHYSICAL EXAM: Vitals:   02/07/16 1045  BP: (!) 170/66  Pulse: 91  Resp: 20  Temp: 97.7 F (36.5 C)  TempSrc: Oral  SpO2: 96%  Weight: 201 lb (91.2 kg)  Height: 6\' 1"  (1.854 m)    GENERAL: The patient is a well-nourished male, in no acute distress. The vital signs are documented above. CARDIAC: There is a regular  rate and rhythm.  VASCULAR: I do not detect carotid bruits.  He has palpable femoral pulses. I cannot palpate pedal pulses although both feet are warm and well-perfused.  His toe amputation sites on the right are completely healed.  PULMONARY: There is good air exchange bilaterally without wheezing or rales. ABDOMEN: Soft and non-tender with normal pitched bowel sounds.  MUSCULOSKELETAL: He has had a previous right first and second toe amputation.  NEUROLOGIC: No focal weakness or paresthesias are detected. SKIN: There are no ulcers or rashes noted. PSYCHIATRIC: The patient has a normal affect.  DATA:   LOWER EXTREMITY ARTERIAL DOPPLER STUDY: I have independently interpreted his lower extremity arterial Doppler study. His ABI in the right is 100%. ABI on the left is 69%.  RIGHT LOWER EXTREMITY DUPLEX: I have independently interpreted his duplex of his right lower extremity bypass graft. He has triphasic Doppler signals throughout the bypass graft without any areas of stenosis noted.  MEDICAL ISSUES:  STATUS POST RIGHT FEMORAL TO TIBIAL PERONEAL TRUNK BYPASS: His bypass graft is patent with a normal ABI on the right. We have again discussed the importance of tobacco cessation although he's not real interested in discussing this. I've encouraged him to stay as active as possible. I ordered a graft duplex and ABIs in 1 year and I will see him back at that time. He knows to call sooner if he has problems.   Deitra Mayo [facility] [phone]

## 2016-02-08 NOTE — Addendum Note (Signed)
Addended by: Lianne Cure A on: 02/08/2016 11:56 AM   Modules accepted: Orders

## 2016-03-14 ENCOUNTER — Other Ambulatory Visit: Payer: Self-pay | Admitting: Cardiothoracic Surgery

## 2016-03-14 DIAGNOSIS — I712 Thoracic aortic aneurysm, without rupture, unspecified: Secondary | ICD-10-CM

## 2016-03-19 ENCOUNTER — Encounter: Payer: Self-pay | Admitting: Internal Medicine

## 2016-03-19 ENCOUNTER — Ambulatory Visit (INDEPENDENT_AMBULATORY_CARE_PROVIDER_SITE_OTHER): Payer: Medicare Other | Admitting: Internal Medicine

## 2016-03-19 VITALS — BP 169/94 | HR 94 | Temp 98.5°F | Wt 197.0 lb

## 2016-03-19 DIAGNOSIS — Z720 Tobacco use: Secondary | ICD-10-CM

## 2016-03-19 DIAGNOSIS — Z96659 Presence of unspecified artificial knee joint: Secondary | ICD-10-CM

## 2016-03-19 DIAGNOSIS — T8459XD Infection and inflammatory reaction due to other internal joint prosthesis, subsequent encounter: Secondary | ICD-10-CM | POA: Diagnosis not present

## 2016-03-19 DIAGNOSIS — Z792 Long term (current) use of antibiotics: Secondary | ICD-10-CM | POA: Diagnosis not present

## 2016-03-19 DIAGNOSIS — I739 Peripheral vascular disease, unspecified: Secondary | ICD-10-CM

## 2016-03-19 DIAGNOSIS — I5032 Chronic diastolic (congestive) heart failure: Secondary | ICD-10-CM | POA: Diagnosis not present

## 2016-03-19 LAB — BASIC METABOLIC PANEL
BUN: 20 mg/dL (ref 7–25)
CALCIUM: 9.1 mg/dL (ref 8.6–10.3)
CHLORIDE: 103 mmol/L (ref 98–110)
CO2: 24 mmol/L (ref 20–31)
CREATININE: 1.48 mg/dL — AB (ref 0.70–1.18)
Glucose, Bld: 333 mg/dL — ABNORMAL HIGH (ref 65–99)
Potassium: 4.2 mmol/L (ref 3.5–5.3)
Sodium: 138 mmol/L (ref 135–146)

## 2016-03-19 LAB — SEDIMENTATION RATE: Sed Rate: 36 mm/hr — ABNORMAL HIGH (ref 0–20)

## 2016-03-19 NOTE — Progress Notes (Signed)
Subjective:    Patient ID: Marc [name], male    DOB: 1940-07-11, 76 y.o.   MRN: [id number]  HPI Here for follow up of PJI.  Marc [name] is a 76 y.o. male with type 2 DM, severe PVD, CKD3, CAD, smoker who has hx of right TKA 2015, most recently had vascular surgery of right femoral to tibial peroneal trunk bypass with vein graft, endarectomy of tibial peroneal trunk with vein patch angioplasty and first ray amputation and placement of wound vac for right great toe dry gangrene on 1/10. He had post operative swelling of right leg and poor wound healing of right foot wound thus on 2/21 underwent I x D, plus 2nd toe ray amputation and wound vac placement. OR cultures grew MRSA and he was discharged on doxycycline for 2 wk which he completed in early March. Thus while off of abtx, he started to have swelling , erythema, and pain to right prosthetic knee join starting on 3/12, he went to Dr. [name] for evaluation as an outpatient and underwent arthrocentesis which was concerning for infection given cloudy fluid. (unclear of cell coutn and culture results). Initially discussed 2 staged revision though the anastomosis of bypass is near his knee that it would likely be compromised if doing 2 staged revision. He was admitted on 3/17 for I x D, exchange of tibial bearing components. OR note suggests early infection,as does clinical history.  He was started on vancomycin and ceftriaxone for projected 6 weeks through 4/27.  Culture remained negative.  Initial CRP of 12 and ESR of 119.  Since then his ESR has reduced to 29 and CRP to 9.0.  CRP though has fluctuated a bit, has been higher from labs from [place] when [place] labs earlier in the course the CRP was normal.  He also had some increase in eosinophilia that resolved and he had remained asymptomatic with no itching, no other issues.   He comes in today continuing on doxycycline and no issues taking it.  He though did stop taking many of his other  medications for HTN and diabetes since he has had a lot of leg edema and felt the medications were causing it.  Much prior to that, he had stopped his diuretic.  No sob.  He also has had significant back pain and went and saw Dr. Maureen Ralphs who has been doing injections with no benefit.  Surgery was not a good option.  Knee continues to hurt a lot.  No fever, no chills, no chest pain associated with doxycycline, no diarrhea.      Review of Systems  Constitutional: Negative for chills and fever.  Gastrointestinal: Positive for constipation. Negative for diarrhea.  Skin: Negative for rash.  Neurological: Negative for dizziness.       Objective:   Physical Exam  Constitutional: He appears well-developed and well-nourished. No distress.  Eyes: No scleral icterus.  Cardiovascular: Normal rate, regular rhythm and normal heart sounds.   Musculoskeletal:  Mild edema and mild warmth in right knee  Neurological: He is alert.  Skin: No rash noted.   Social History   Social History  . Marital status: Legally Separated    Spouse name: N/A  . Number of children: 2  . Years of education: N/A   Occupational History  . retired   . cattle farm     owner   Social History Main Topics  . Smoking status: Current Every Day Smoker    Packs/day: 1.00  Years: 58.00    Types: Cigarettes  . Smokeless tobacco: Former Systems developer    Types: Chew     Comment: 1-2 pks per day.   . Alcohol use No  . Drug use: No  . Sexual activity: No   Other Topics Concern  . Not on file   Social History Narrative   Patient lives at home with his spouse.   Caffeine Use: 2 cups daily         Assessment & Plan:

## 2016-03-19 NOTE — Assessment & Plan Note (Signed)
I explained that his leg edema is most c/w CHF and diuretics are the best option and his other medications with edema listed are unlikely to be significant contributors and recommended he restart.

## 2016-03-19 NOTE — Assessment & Plan Note (Signed)
Discussed cessation and he has no interest in quitting

## 2016-03-19 NOTE — Assessment & Plan Note (Signed)
Will check cmp today  

## 2016-03-19 NOTE — Assessment & Plan Note (Signed)
I explained that many of his medications help with this and he should restart.

## 2016-03-20 LAB — C-REACTIVE PROTEIN: CRP: 16.4 mg/L — ABNORMAL HIGH (ref <8.0)

## 2016-03-27 ENCOUNTER — Encounter: Payer: Self-pay | Admitting: Cardiology

## 2016-04-11 ENCOUNTER — Encounter: Payer: Self-pay | Admitting: Cardiothoracic Surgery

## 2016-04-11 ENCOUNTER — Ambulatory Visit (INDEPENDENT_AMBULATORY_CARE_PROVIDER_SITE_OTHER): Payer: Medicare Other | Admitting: Cardiothoracic Surgery

## 2016-04-11 ENCOUNTER — Ambulatory Visit
Admission: RE | Admit: 2016-04-11 | Discharge: 2016-04-11 | Disposition: A | Discharge status: Home / Self Care | Payer: Medicare Other | Source: Ambulatory Visit | Attending: Cardiothoracic Surgery | Admitting: Cardiothoracic Surgery

## 2016-04-11 VITALS — BP 161/75 | HR 56 | Resp 16 | Ht 73.0 in | Wt 197.0 lb

## 2016-04-11 DIAGNOSIS — I712 Thoracic aortic aneurysm, without rupture, unspecified: Secondary | ICD-10-CM

## 2016-04-11 DIAGNOSIS — R911 Solitary pulmonary nodule: Secondary | ICD-10-CM

## 2016-04-11 NOTE — Progress Notes (Signed)
[address]       [place] [address]             [facility] Record #[id number] Date of Birth: [birth year]  Referring: [name], * Primary Care: [name], MD  Chief Complaint:    Chief Complaint  Patient presents with  . TAA    8 month f/u with CT CHEST  . Lung Lesion    History of Present Illness:    Marc [name] 76 y.o. male is Followed in the office for abnormal screening CT scan of the chest. The patient has a long history of smoking since age 67 up to 2 packs a day for more than 30 years. He has known peripheral vascular disease, history of abnormal EKG but no known acute myocardial infarction. He is limited in his physical ability because of chronic back next in knee pain, status post back surgery 3 in right knee replacement. He has known peripheral vascular disease having stents placed in right and left iliac arteries by Dr. Gwenlyn Found. He complains of chronic leg pain bilateral and numbness in his right foot. He notes over the past 3 months he's been almost completely incapacitated with chronic back pain.    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      NEEDLE BIOPSY  OF LEFT LUNG LESION HAS BEEN DONEHAS BEEN DONE _  Diagnosis Lung, needle/core biopsy(ies), left lower lobe - BENIGN LUNG PARENCHYMA WITH MILD INFLAMMATION AND INTERSTITIAL FIBROSIS. - THERE IS NO EVIDENCE OF GRANULOMATA OR MALIGNANCY. Marc Cutter MD     Current Activity/ Functional Status:  Patient is independent with mobility/ambulation, transfers, ADL's, IADL's. patient does have difficulty with ambulation frequently uses a cane when out in public because of leg weakness right greater than left. He notes that he is not  as active as he has been over the years, stays at home and watches TV a lot   Zubrod Score: At the time of surgery this patient's most appropriate activity status/level should be described as: []     0    Normal activity, no symptoms []     1    Restricted in physical strenuous activity but ambulatory, able to do out light work [x]     2    Ambulatory and capable of self care, unable to do work activities, up and about               >50 % of waking hours                              []     3    Only limited self care, in bed greater than 50% of waking hours []     4    Completely disabled, no self care, confined to bed or chair []     5    Moribund   Past Medical History:  Diagnosis Date  . Anemia    low iron  . ARF (acute renal failure) ([place]) 12/2014  . Arthritis   . Bradycardia   . Chronic bronchitis ([facility])   . CKD (chronic kidney disease), stage III   .  Colon polyp   . Depression    occassional.  Situational  . GERD (gastroesophageal reflux disease)   . Gout   . Gout   . History of blood transfusion   . History of GI bleed 2007  . Hypercholesteremia   . Hypertension   . Neuropathy ([place])   . PAD (peripheral artery disease) ([place])   . Pneumonia    denies  . Tobacco abuse disorder 03/10/2012  . Type II diabetes mellitus ([facility])   . Ulcer disease     Past Surgical History:  Procedure Laterality Date  . AMPUTATION Right 02/07/2015   Procedure: GREAT TOE RAY AMPUTATION RIGHT ;  Surgeon: Angelia [name], MD;  Location: [facility];  Service: Vascular;  Laterality: Right;  . AMPUTATION TOE Right 03/21/2015   Procedure: AMPUTATION TOE-RIGHT SECOND TOE;  Surgeon: Angelia [name], MD;  Location: [facility];  Service: Vascular;  Laterality: Right;  . ANTERIOR CERVICAL DECOMP/DISCECTOMY FUSION  ~ 2007  . ANTERIOR LAT LUMBAR FUSION Left 01/19/2014   Procedure: LATERAL INTERBODY FUSION 1 LEVEL;  Surgeon: Sinclair Ship, MD;  Location: [facility];  Service: Orthopedics;  Laterality: Left;   Left lumbar 3-4 lateral interbody fusion with instrumentation, allograft  . APPLICATION OF WOUND VAC Right 02/07/2015   Procedure:  APPLICATION OF WOUND VAC right great toe amputation site.;  Surgeon: Angelia [name], MD;  Location: [facility];  Service: Vascular;  Laterality: Right;  . APPLICATION OF WOUND VAC Right 03/21/2015   Procedure: APPLICATION OF WOUND VAC RIGHT FOOT;  Surgeon: Angelia [name], MD;  Location: [facility];  Service: Vascular;  Laterality: Right;  . BACK SURGERY    . BYPASS GRAFT FEMORAL-PERONEAL Right 02/07/2015   Procedure: BYPASS GRAFT FEMORAL-PERONEAL Trunk WITH VEIN graft  right leg.;  Surgeon: Angelia [name], MD;  Location: [facility];  Service: Vascular;  Laterality: Right;  . COLONOSCOPY    . ENDARTERECTOMY TIBIOPERONEAL Right 02/07/2015   Procedure: ENDARTERECTOMY TIBIOPERONEAL;  Surgeon: Angelia [name], MD;  Location: [facility];  Service: Vascular;  Laterality: Right;  . EXCISIONAL TOTAL KNEE ARTHROPLASTY WITH ANTIBIOTIC SPACERS Right 04/14/2015   Procedure: RIGHT KNEE [place] OUT AND PLACEMENT OF DRAINS;  Surgeon: Frederik Pear, MD;  Location: [facility];  Service: Orthopedics;  Laterality: Right;  . EYE SURGERY Bilateral    cataract surgery with lens implant  . HERNIA REPAIR  ~ 2007   UHR (02/04/2012)  . ILIAC ARTERY STENT Left    left common/notes (02/04/2012)  . INTRAOPERATIVE ARTERIOGRAM Right 02/07/2015   Procedure: INTRA OPERATIVE ARTERIOGRAM;  Surgeon: Angelia [name], MD;  Location: [facility];  Service: Vascular;  Laterality: Right;  . IRRIGATION AND DEBRIDEMENT KNEE Right 04/14/2015   Procedure: IRRIGATION AND DEBRIDEMENT KNEE;  Surgeon: Frederik Pear, MD;  Location: [facility];  Service: Orthopedics;  Laterality: Right;  . LOWER EXTREMITY ANGIOGRAM N/A 02/04/2012   Procedure: LOWER EXTREMITY ANGIOGRAM;  Surgeon: Lorretta Harp, MD;  Location: [facility] CATH LAB;  Service: Cardiovascular;  Laterality: N/A;  . LUMBAR LAMINECTOMY  11/14  . PATCH ANGIOPLASTY Right 02/07/2015    Procedure: Vein PATCH ANGIOPLASTY to tibioperoneal trunk;  Surgeon: Angelia [name], MD;  Location: [facility];  Service: Vascular;  Laterality: Right;  . PERCUTANEOUS STENT INTERVENTION Left 02/04/2012   Procedure: PERCUTANEOUS STENT INTERVENTION;  Surgeon: Lorretta Harp, MD;  Location: [facility] CATH LAB;  Service: Cardiovascular;  Laterality: Left;  lt ext iliac stent  . PERIPHERAL VASCULAR CATHETERIZATION N/A 02/02/2015   Procedure: Abdominal Aortogram w/Lower  Extremity;  Surgeon: Angelia [name], MD;  Location: [facility] CV LAB;  Service: Cardiovascular;  Laterality: N/A;  . POLYPECTOMY    . REPLACEMENT TOTAL KNEE Right   . SPLENECTOMY  ~ 1957  . TONSILLECTOMY  ~ 1947  . TOTAL KNEE ARTHROPLASTY Right 02/03/2013   Procedure: TOTAL KNEE ARTHROPLASTY;  Surgeon: [name], MD;  Location: [facility];  Service: Orthopedics;  Laterality: Right;  . UPPER ENDOSCOPY W/ SCLEROTHERAPY  ~ 2007  . WOUND DEBRIDEMENT Right 03/21/2015   Procedure: DEBRIDEMENT WOUND of RIGHT FOOT;  Surgeon: Angelia [name], MD;  Location: [facility] OR;  Service: Vascular;  Laterality: Right;    Family History  Problem Relation Age of Onset  . Colon cancer Paternal Uncle     Uncle  . Heart disease Father   . Hypertension Father   . Heart attack Father   . Heart disease Mother   . Diabetes Mother   . Hypertension Mother     Social History   Social History  . Marital status: Legally Separated    Spouse name: N/A  . Number of children: 2  . Years of education: N/A   Occupational History  . retired   . cattle farm     owner   Social History Main Topics  . Smoking status: Current Every Day Smoker    Packs/day: 1.00    Years: 58.00    Types: Cigarettes  . Smokeless tobacco: Former Systems developer    Types: Chew     Comment: 1-2 pks per day.   . Alcohol use No  . Drug use: No  . Sexual activity: No   Other Topics Concern  . Not on file   Social History Narrative   Patient lives at home with his spouse.    Caffeine Use: 2 cups daily    History  Smoking Status  . Current Every Day Smoker  . Packs/day: 1.00  . Years: 58.00  . Types: Cigarettes  Smokeless Tobacco  . Former Systems developer  . Types: Chew    Comment: 1-2 pks per day.     History  Alcohol Use No     Allergies  Allergen Reactions  . Actos [Pioglitazone] Swelling and Other (See Comments)    LOWER EXTREMITY EDEMA     Current Outpatient Prescriptions  Medication Sig Dispense Refill  . allopurinol (ZYLOPRIM) 300 MG tablet Take 300 mg by mouth at bedtime.     [name] aspirin EC 325 MG tablet Take 1 tablet (325 mg total) by mouth 2 (two) times daily. 30 tablet 0  . clopidogrel (PLAVIX) 75 MG tablet Take 75 mg by mouth every other day.    . colchicine 0.6 MG tablet Take 0.6 mg by mouth daily.    [name] docusate sodium (COLACE) 100 MG capsule Take 100 mg by mouth 2 (two) times daily.    [name] doxycycline (MONODOX) 100 MG capsule Take 1 capsule (100 mg total) by mouth 2 (two) times daily. 180 capsule 3  . furosemide (LASIX) 80 MG tablet Take 1 tablet (80 mg total) by mouth daily. 5 tablet 0  . gabapentin (NEURONTIN) 600 MG tablet Take 1,200 mg by mouth every 12 (twelve) hours.     [name] glimepiride (AMARYL) 4 MG tablet Take 8 mg by mouth daily with breakfast.     . indomethacin (INDOCIN) 25 MG capsule     . lisinopril (PRINIVIL,ZESTRIL) 20 MG tablet Take 20 mg by mouth daily.    [name] oxyCODONE (ROXICODONE) 15 MG immediate  release tablet     . pantoprazole (PROTONIX) 40 MG tablet Take 40 mg by mouth daily.    . Prenatal Vit-Fe Fumarate-FA (MULTIVITAMIN-PRENATAL) 27-0.8 MG TABS tablet Take 1 tablet by mouth daily at 12 noon.     No current facility-administered medications for this visit.    Facility-Administered Medications Ordered in Other Visits  Medication Dose Route Frequency Provider Last Rate Last Dose  . 0.9 %  sodium chloride infusion   Intravenous Continuous Angelia [name], MD      . Chlorhexidine Gluconate Cloth 2 % PADS 6 each  6 each  Topical Once Angelia [name], MD         Review of Systems:     Cardiac Review of Systems: Y or N  Chest Pain [ n   ]  Resting SOB [  n ] Exertional SOB  Blue.Reese  ]  Orthopnea [ n ]   Pedal Edema [ y  ]    Palpitations [ n ] Syncope  [ n ]   Presyncope [  n ]  General Review of Systems: [Y] = yes [  ]=no Constitional: recent weight change [ n ];  Wt loss over the last 3 months [   ] anorexia [  ]; fatigue [ y ]; nausea [n  ]; night sweats [  ]; fever [ n ]; or chills [n  ];          Dental: poor dentition[  ]; Last Dentist visit:   Eye : blurred vision [  n]; diplopia [ n  ]; vision changes [ n ];  Amaurosis fugax[  n]; Resp: cough [ n ];  wheezing[n  ];  hemoptysis[ n ]; shortness of breath[ y ]; paroxysmal nocturnal dyspnea[ n ]; dyspnea on exertion[ y ]; or orthopnea[  ];  GI:  gallstones[  ], vomiting[ n ];  dysphagia[  ]; melena[  ];  hematochezia [  ]; heartburn[  ];   Hx of  Colonoscopy[  ]; GU: kidney stones [  ]; hematuria[n  ];   dysuria [  ];  nocturia[  ];  history of     obstruction [  ]; urinary frequency [  ]             Skin: rash, swelling[  ];, hair loss[ y ];  peripheral edema[y  ];  or itching[  ]; Musculosketetal: myalgias[y  ];  joint swelling[y  ];  joint erythema[  ];  joint pain[y  ];  back pain[y  ];  Heme/Lymph: bruising[ y ];  bleeding[ n ];  anemia[ n ];  Neuro: TIA[n  ];  headaches[  ];  stroke[  ];  vertigo[  ];  seizures[ n ];   paresthesias[y  ];  difficulty walking[y  ];  Psych:depression[  ]; anxiety[  ];  Endocrine: diabetes[n  ];  thyroid dysfunction[n  ];  Immunizations: Flu up to date [ y ]; Pneumococcal up to date Blue.Reese  ];  Other:  Physical Exam: BP (!) 161/75 (BP Location: Left Arm, Patient Position: Sitting, Cuff Size: Large)   Pulse (!) 56   Resp 16   Ht 6\' 1"  (1.854 m)   Wt 197 lb (89.4 kg)   SpO2 98% Comment: ON RA  BMI 25.99 kg/m   PHYSICAL EXAMINATION: General appearance: alert, cooperative and appears older than stated age Head:  Normocephalic, without obvious abnormality, atraumatic Neck: no adenopathy, no carotid bruit, no JVD, supple, symmetrical, trachea midline and thyroid not  enlarged, symmetric, no tenderness/mass/nodules Lymph nodes: Cervical, supraclavicular, and axillary nodes normal. Resp: clear to auscultation bilaterally Back: symmetric, no curvature. ROM normal. No CVA tenderness. Cardio: regular rate and rhythm, S1, S2 normal, no murmur, click, rub or gallop GI: soft, non-tender; bowel sounds normal; no masses,  no organomegaly  Neurologic: Gait: Patient has trouble ambulating especially with right leg weakness  Diagnostic Studies & Laboratory data:     Recent Radiology Findings:  Ct Chest Wo Contrast  Result Date: 04/11/2016 CLINICAL DATA:  76 year old male with history of thoracic aortic aneurysm. Cough. Followup study. EXAM: CT CHEST WITHOUT CONTRAST TECHNIQUE: Multidetector CT imaging of the chest was performed following the standard protocol without IV contrast. COMPARISON:  Chest CT 08/14/2015. FINDINGS: Cardiovascular: Heart size is normal. There is no significant pericardial fluid, thickening or pericardial calcification. There is aortic atherosclerosis, as well as atherosclerosis of the great vessels of the mediastinum and the coronary arteries, including calcified atherosclerotic plaque in the left main, left anterior descending, left circumflex and right coronary arteries. Calcifications of the aortic valve. Mediastinum/Nodes: No pathologically enlarged mediastinal or hilar lymph nodes. Please note that accurate exclusion of hilar adenopathy is limited on noncontrast CT scans. Esophagus is unremarkable in appearance. No axillary lymphadenopathy. Lungs/Pleura: Several tiny 1-3 mm pulmonary nodules are again noted throughout the periphery of the lungs bilaterally, highly nonspecific, but similar to prior study, presumably benign areas of mucoid impaction within terminal bronchioles. The largest of these  measures 3 mm in the left upper lobe (image 31 of series 4). No other larger more suspicious appearing pulmonary nodules or masses are noted. There continues to be a rounded pleural-based density in the posterior aspect of the left lower lobe with some "pleural tails", which is unchanged, most compatible with rounded atelectasis. Calcified pleural plaques are again noted in the thorax bilaterally, compatible with asbestos related pleural disease. No acute consolidative airspace disease. No pleural effusions. Mild diffuse bronchial wall thickening with mild centrilobular and paraseptal emphysema, most evident in the lung apices. Saber sheath trachea. Upper Abdomen: Aortic atherosclerosis. Musculoskeletal: There are no aggressive appearing lytic or blastic lesions noted in the visualized portions of the skeleton. IMPRESSION: 1. Ectasia of the ascending thoracic aorta which currently measures 4.4 cm in diameter (previously 4.2 cm on 08/14/2015). Recommend annual imaging followup by CTA or MRA. This recommendation follows 2010 ACCF/AHA/AATS/ACR/ASA/SCA/SCAI/SIR/STS/SVM Guidelines for the Diagnosis and Management of Patients with Thoracic Aortic Disease. Circulation. 2010; 121: F163-W466. 2. Calcified pleural plaques bilaterally, indicative of asbestos related pleural disease. Chronic rounded atelectasis in the posteromedial aspect of the left lower lobe is unchanged. 3. Saber sheath trachea, mild diffuse bronchial wall thickening with mild centrilobular and paraseptal emphysema; imaging findings suggestive of underlying COPD. 4. Left main and 3 vessel coronary artery disease. Assessment for potential risk factor modification, dietary therapy or pharmacologic therapy may be warranted, if clinically indicated. Electronically Signed   By: Vinnie Langton M.D.   On: 04/11/2016 12:42   Ct Chest Wo Contrast  08/14/2015  CLINICAL DATA:  Follow-up lung mass. Negative biopsy left lung 2016. EXAM: CT CHEST WITHOUT CONTRAST  TECHNIQUE: Multidetector CT imaging of the chest was performed following the standard protocol without IV contrast. COMPARISON:  CT chest dated 12/12/2014 and CT lung biopsy dated 07/26/2014. FINDINGS: Mediastinum/Lymph Nodes: Scattered atherosclerotic changes of the thoracic aorta. Stable ascending thoracic aortic aneurysm measuring 4.2 cm, unchanged. Heart size is normal. Coronary artery calcifications noted. No pericardial effusion. No masses or enlarged lymph nodes seen within the mediastinum  or perihilar regions. Esophagus is unremarkable. Trachea and central bronchi are unremarkable. Lungs/Pleura: Rounded pleural based mass/consolidation within the posterior medial left lower lobe is stable, measuring 3 x 1.4 cm. Lungs are otherwise clear. No pleural effusion. Upper abdomen: Limited images of the upper abdomen are unremarkable. Musculoskeletal: Mild degenerative change again noted throughout the kyphotic thoracic spine. No acute or suspicious osseous finding. Superficial soft tissues are unremarkable. IMPRESSION: 1. Stable appearance of the rounded pleural based mass/consolidation within the posterior-medial portion of the left lower lobe, again measuring 3 x 1.4 cm, previously biopsied with benign pathology result. This has now been shown to be stable for greater than 1 year. As indicated on the previous CT, this is most likely rounded atelectasis. Would consider an additional follow-up chest CT in 12 months to ensure 2 year stability. 2. Additional 5 mm subpleural nodule in the right lower lobe (series 4, image 93) is also stable compared to the previous exams suggesting benignity. This can also be assessed for 2 year stability in 12 months. 3. Stable aneurysm of the ascending thoracic aorta measuring 4.2 cm. Recommend semi-annual imaging followup by CTA or MRA and referral to cardiothoracic surgery if not already obtained. This recommendation follows 2010 ACCF/AHA/AATS/ACR/ASA/SCA/SCAI/SIR/STS/SVM  Guidelines for the Diagnosis and Management of Patients With Thoracic Aortic Disease. Circulation. 2010; 121: e266-e36 4. Aortic atherosclerosis. Electronically Signed   By: Franki Cabot M.D.   On: 08/14/2015 14:51   Ct Chest Wo Contrast  12/12/2014  CLINICAL DATA:  Follow-up left lower lobe mass, negative biopsy, smoker EXAM: CT CHEST WITHOUT CONTRAST TECHNIQUE: Multidetector CT imaging of the chest was performed following the standard protocol without IV contrast. COMPARISON:  PET-CT dated 07/19/2014. Lung cancer screening chest CT dated 07/11/2014. FINDINGS: Mediastinum/Nodes: Heart is normal in size. No pericardial effusion. Coronary atherosclerosis. Atherosclerotic calcifications of the aortic arch. 4.2 cm ascending thoracic aortic aneurysm, unchanged. Small mediastinal lymph nodes which do not meet pathologic CT size criteria. Visualized thyroid is unremarkable. Lungs/Pleura: 1.6 x 3.0 cm rounded subpleural opacity in the posteromedial left lower lobe (series 4/ image 41), previously 1.3 x 3.0 cm, unchanged. Additional 5 mm subpleural nodule in the right lower lobe (series 4/image 46), unchanged. Underlying mild paraseptal emphysematous changes. No focal consolidation. Calcified pleural plaques (series 3/ image 22). No pleural effusion or pneumothorax. Upper abdomen: Visualized upper abdomen is notable for vascular calcifications. Musculoskeletal: Degenerative changes of the visualized thoracolumbar spine. IMPRESSION: 1.6 x 3.0 cm rounded subpleural opacities in the posteromedial left lower lobe, unchanged. Given the benign biopsy results, the lack of hypermetabolism on PET, and the associated asbestos related pleural disease, this overall appearance remains compatible with rounded atelectasis. Consider follow-up CT chest in 6-12 months. 4.2 cm ascending thoracic aortic aneurysm, unchanged. Recommend semi-annual imaging followup by CTA or MRA and referral to cardiothoracic surgery if not already obtained.  This recommendation follows 2010 ACCF/AHA/AATS/ACR/ASA/SCA/SCAI/SIR/STS/SVM Guidelines for the Diagnosis and Management of Patients With Thoracic Aortic Disease. Circulation. 2010; 121: V761-Y07 Electronically Signed   By: Julian Hy M.D.   On: 12/12/2014 15:07    Nm Pet Image Initial (pi) Skull Base To Thigh  07/19/2014   CLINICAL DATA:  Initial treatment strategy for left lower lobe lung nodule.  EXAM: NUCLEAR MEDICINE PET SKULL BASE TO THIGH  TECHNIQUE: 9.8 mCi F-18 FDG was injected intravenously. Full-ring PET imaging was performed from the skull base to thigh after the radiotracer. CT data was obtained and used for attenuation correction and anatomic localization.  FASTING BLOOD GLUCOSE:  Value: 163 mg/dl  COMPARISON:  Screening chest CT on 07/11/2014  FINDINGS: NECK  No hypermetabolic lymph nodes in the neck.  CHEST  No hypermetabolic mediastinal or hilar nodes. Pleural-based nodular opacity in the posterior left lower lobe measures 1.5 x 2.6 cm, which is stable in size. This shows low-grade metabolic activity with SUV max of 2.4.  A 5 mm pleural-based nodule is all seen in the posterior right lower lobe on image 58/series 7 which is stable and shows no hypermetabolic activity although it is too small to characterize by PET.  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 Mild emphysema again demonstrated.  ABDOMEN/PELVIS  No abnormal hypermetabolic activity within the liver, pancreas, adrenal glands, or spleen. No hypermetabolic lymph nodes in the abdomen or pelvis.  Mildly enlarged prostate seen with mass effect on bladder base.  SKELETON  No focal hypermetabolic activity to suggest skeletal metastasis.  IMPRESSION: 1.5 x 2.6 cm pleural-based nodule in posterior left lower lobe shows low-grade metabolic activity, which is nonspecific. Differential considerations include low-grade malignancy, rounded atelectasis, and inflammatory or infectious process.  Nonspecific 5 mm pleural-based nodule  in posterior right lower lobe shows no metabolic activity, but is too small to characterize by PET. Continued attention on follow-up CT recommended.  No evidence of thoracic nodal metastases or distant metastatic disease.  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 Recommend semi-annual imaging followup by CTA or MRA and referral to cardiothoracic surgery if not already obtained. This recommendation follows 2010 ACCF/AHA/AATS/ACR/ASA/SCA/SCAI/SIR/STS/SVM Guidelines for the Diagnosis and Management of Patients With Thoracic Aortic Disease. Circulation. 2010; 121: N027-O536.   Electronically Signed   By: Earle Gell M.D.   On: 07/19/2014 10:11   Ct Chest Lung Ca Screen Low Dose W/o Cm  07/11/2014   CLINICAL DATA:  76 year old male current smoker with 90 pack-year history of smoking. Lung cancer screening examination.  EXAM: CT CHEST WITHOUT CONTRAST  TECHNIQUE: Multidetector CT imaging of the chest was performed following the standard protocol without IV contrast.  COMPARISON:  No priors.  FINDINGS: Mediastinum/Lymph Nodes: Heart size is normal. There is no significant pericardial fluid, thickening or pericardial calcification. No pathologically enlarged mediastinal or hilar lymph nodes. There is atherosclerosis of the thoracic aorta, the great vessels of the mediastinum and the coronary arteries, including calcified atherosclerotic plaque in the left main, left anterior descending, left circumflex and right coronary arteries. In addition, there is mild aneurysmal dilatation (4.5 cm) of the ascending thoracic aorta. Calcifications of the aortic valve. Please note that accurate exclusion of hilar adenopathy is limited on noncontrast CT scans. Esophagus is unremarkable in appearance. Saber sheath trachea. No axillary lymphadenopathy.  Lungs/Pleura: In the posterior aspect of the left lower lobe (image 181 of series 3) there is a pleural-based nodular opacity which has a volume derived mean diameter of  approximately 23.8 mm. Tiny subpleural nodule also noted in the periphery of the right lower lobe (image 280 of series 3) as well, with a mean diameter of 6.9 mm. No acute consolidative airspace disease. No pleural effusions. 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 Mild diffuse bronchial wall thickening with mild centrilobular and paraseptal emphysema.  Upper Abdomen: Atherosclerosis.  Otherwise, unremarkable.  Musculoskeletal/Soft Tissues: There are no aggressive appearing lytic or blastic lesions noted in the visualized portions of the skeleton.  IMPRESSION: 1. Lung-RADS Category 4BS, suspicious. Specifically, there is a large pleural based nodule in the posterior left lower lobe which has a volume derived  mean diameter of approximately 23.8 mm. Given the apparent asbestos related pleural disease, this could simply represent an area of rounded atelectasis, however, the possibility of a malignant bronchogenic neoplasm, or early malignant mesothelioma is not excluded, and further evaluation with PET-CT is recommended at this time. 2. The "S" modifier above refers to potentially clinically significant non lung cancer related findings. Specifically, Atherosclerosis, including left main and 3 vessel coronary artery disease. Assessment for potential risk factor modification, dietary therapy or pharmacologic therapy may be warranted, if clinically indicated. 3. In addition, there is mild aneurysmal dilatation (4.5 cm in diameter) of the ascending thoracic aorta. Ascending thoracic aortic aneurysm. Recommend semi-annual imaging followup by CTA or MRA and referral to cardiothoracic surgery if not already obtained. This recommendation follows 2010 ACCF/AHA/AATS/ACR/ASA/SCA/SCAI/SIR/STS/SVM Guidelines for the Diagnosis and Management of Patients With Thoracic Aortic Disease. Circulation. 2010; 121: V616-W737. 4. Mild diffuse bronchial wall thickening with mild  centrilobular and paraseptal emphysema; imaging findings suggestive of underlying COPD. These results were called by telephone at the time of interpretation on 07/11/2014 at 9:58 am to Dr. Claris Gower, who verbally acknowledged these results.   Electronically Signed   By: Vinnie Langton M.D.   On: 07/11/2014 10:05     I have independently reviewed the above radiology studies  and reviewed the findings with the patient.   Recent Lab Findings: Lab Results  Component Value Date   WBC 6.7 01/02/2016   HGB 10.9 (L) 01/02/2016   HCT 33.1 (L) 01/02/2016   PLT 155 01/02/2016   GLUCOSE 333 (H) 03/19/2016   CHOL 151 04/14/2015   TRIG 156 (H) 04/14/2015   HDL 52 04/14/2015   LDLCALC 68 04/14/2015   ALT 7 (L) 01/02/2016   AST 27 01/02/2016   NA 138 03/19/2016   K 4.2 03/19/2016   CL 103 03/19/2016   CREATININE 1.48 (H) 03/19/2016   BUN 20 03/19/2016   CO2 24 03/19/2016   TSH 1.239 01/16/2015   INR 1.04 04/13/2015   HGBA1C 7.6 (H) 03/19/2015   Aortic Size Index=     4.5    /Body surface area is 2.15 meters squared. = 2.11  < 2.75 cm/m2      4% risk per year 2.75 to 4.25          8% risk per year > 4.25 cm/m2    20% risk per year  lOWER EXTREMITIY vASCULAR STUDY: Summary: Evidence of 50-99% stenosis of the right mid femoral artery. 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 Recanalized flow to the right popliteal artery. Occlusion of the right ATA (known occlusion). Near occlusion of the right PTA. Monophasic flow in the right peroneal artery.  Other specific details can be found in the table(s) above. Prepared and Electronically Authenticated by  Curt Jews 2016-06-27T16:38:16   Assessment / Plan:   Stable left plural base opacity left lower lobe, previously biopsied negative has not changed since previous scan  . The patient does have a history of asbestos exposure in the past.   Ascending thoracic aortic aneurysm-mild aneurysmal dilatation (4.5 cm in  diameter) of the ascending thoracic aorta-patient has no murmur of aortic insufficiency  or aortic stenosis unchanged since the previous scan  Atherosclerosis, including left main and 3 vessel coronary artery disease - the patient denies any definite anginal symptoms but does have known left main and three-vessel coronary artery disease on a nonspecific CT scan based on calcium only. He is followed by Dr. Wynonia Lawman cardiology  He is  followed in the vascular surgery office following foot infection and bypass surgery in the right leg.   We'll plan repeat CT scan of the chest in one year   Grace Isaac MD      [place] [place] [address] Office [phone]   [place]

## 2016-05-23 ENCOUNTER — Other Ambulatory Visit: Payer: Self-pay | Admitting: Neurosurgery

## 2016-05-23 DIAGNOSIS — M48062 Spinal stenosis, lumbar region with neurogenic claudication: Secondary | ICD-10-CM

## 2016-05-30 ENCOUNTER — Ambulatory Visit
Admission: RE | Admit: 2016-05-30 | Discharge: 2016-05-30 | Disposition: A | Discharge status: Home / Self Care | Payer: Medicare Other | Source: Ambulatory Visit | Attending: Neurosurgery | Admitting: Neurosurgery

## 2016-05-30 VITALS — BP 108/68 | HR 81

## 2016-05-30 DIAGNOSIS — M48062 Spinal stenosis, lumbar region with neurogenic claudication: Secondary | ICD-10-CM

## 2016-05-30 DIAGNOSIS — M5136 Other intervertebral disc degeneration, lumbar region: Secondary | ICD-10-CM

## 2016-05-30 MED ORDER — DIAZEPAM 5 MG PO TABS
5.0000 mg | ORAL_TABLET | Freq: Once | ORAL | Status: AC
  Administered 2016-05-30: 5 mg via ORAL

## 2016-05-30 MED ORDER — MEPERIDINE HCL 100 MG/ML IJ SOLN
100.0000 mg | Freq: Once | INTRAMUSCULAR | Status: AC
  Administered 2016-05-30: 100 mg via INTRAMUSCULAR

## 2016-05-30 MED ORDER — ONDANSETRON HCL 4 MG/2ML IJ SOLN
4.0000 mg | Freq: Once | INTRAMUSCULAR | Status: AC
  Administered 2016-05-30: 4 mg via INTRAMUSCULAR

## 2016-05-30 MED ORDER — IOPAMIDOL (ISOVUE-M 200) INJECTION 41%
15.0000 mL | Freq: Once | INTRAMUSCULAR | Status: AC
  Administered 2016-05-30: 15 mL via INTRATHECAL

## 2016-05-30 NOTE — Discharge Instructions (Signed)
Myelogram Discharge Instructions  1. Go home and rest quietly for the next 24 hours.  It is important to lie flat for the next 24 hours.  Get up only to go to the restroom.  You may lie in the bed or on a couch on your back, your stomach, your left side or your right side.  You may have one pillow under your head.  You may have pillows between your knees while you are on your side or under your knees while you are on your back.  2. DO NOT drive today.  Recline the seat as far back as it will go, while still wearing your seat belt, on the way home.  3. You may get up to go to the bathroom as needed.  You may sit up for 10 minutes to eat.  You may resume your normal diet and medications unless otherwise indicated.  Drink lots of extra fluids today and tomorrow.  4. The incidence of headache, nausea, or vomiting is about 5% (one in 20 patients).  If you develop a headache, lie flat and drink plenty of fluids until the headache goes away.  Caffeinated beverages may be helpful.  If you develop severe nausea and vomiting or a headache that does not go away with flat bed rest, call [phone].  5. You may resume normal activities after your 24 hours of bed rest is over; however, do not exert yourself strongly or do any heavy lifting tomorrow. If when you get up you have a headache when standing, go back to bed and force fluids for another 24 hours.  6. Call your physician for a follow-up appointment.  The results of your myelogram will be sent directly to your physician by the following day.  7. If you have any questions or if complications develop after you arrive home, please call [phone].  Discharge instructions have been explained to the patient.  The patient, or the person responsible for the patient, fully understands these instructions.        MAY RESUME CLOPIDOGREL TODAY.

## 2016-05-30 NOTE — Progress Notes (Signed)
Pt states he has been off Clopidgrel since last Thursday. Only taking 1 aspirin a day.

## 2016-06-27 ENCOUNTER — Encounter: Payer: Self-pay | Admitting: Psychology

## 2016-06-27 ENCOUNTER — Encounter: Payer: Medicare Other | Attending: Psychology | Admitting: Psychology

## 2016-06-27 DIAGNOSIS — K219 Gastro-esophageal reflux disease without esophagitis: Secondary | ICD-10-CM | POA: Insufficient documentation

## 2016-06-27 DIAGNOSIS — I129 Hypertensive chronic kidney disease with stage 1 through stage 4 chronic kidney disease, or unspecified chronic kidney disease: Secondary | ICD-10-CM | POA: Insufficient documentation

## 2016-06-27 DIAGNOSIS — E7800 Pure hypercholesterolemia, unspecified: Secondary | ICD-10-CM | POA: Insufficient documentation

## 2016-06-27 DIAGNOSIS — Z86010 Personal history of colonic polyps: Secondary | ICD-10-CM | POA: Diagnosis not present

## 2016-06-27 DIAGNOSIS — E1122 Type 2 diabetes mellitus with diabetic chronic kidney disease: Secondary | ICD-10-CM | POA: Diagnosis not present

## 2016-06-27 DIAGNOSIS — I739 Peripheral vascular disease, unspecified: Secondary | ICD-10-CM | POA: Diagnosis not present

## 2016-06-27 DIAGNOSIS — G894 Chronic pain syndrome: Secondary | ICD-10-CM | POA: Diagnosis not present

## 2016-06-27 DIAGNOSIS — D509 Iron deficiency anemia, unspecified: Secondary | ICD-10-CM | POA: Insufficient documentation

## 2016-06-27 DIAGNOSIS — N183 Chronic kidney disease, stage 3 (moderate): Secondary | ICD-10-CM | POA: Insufficient documentation

## 2016-06-27 DIAGNOSIS — F329 Major depressive disorder, single episode, unspecified: Secondary | ICD-10-CM | POA: Insufficient documentation

## 2016-06-27 DIAGNOSIS — M109 Gout, unspecified: Secondary | ICD-10-CM | POA: Diagnosis not present

## 2016-06-27 NOTE — Progress Notes (Signed)
Neuropsychological Consultation   Patient:   Marc [name]   DOB:   [birth year]  MR Number:  [id number]  Location:  [facility] [address], [place] [id number] [place]  [address] Dept: [phone]           Date of Service:   06/27/2016  Start Time:   3 PM End Time:   4 PM  Provider/Observer:  Ilean Skill, Psy.D.       Clinical Neuropsychologist       Billing Code/Service: Psychiatric Diagnostic Interview  Chief Complaint:    The patient has been dealing with Chronic Pain for some time.  The pain has also disturbed sleep and created insomnia.  These symptoms are reported to have started 01/15/2016.  Reason for Service:  The patient is a 76 year old male referred by Dr. Maryjean Ka for a psychological evaluation as part of the standard protocol for consideration for Spinal Cord Stimulator Trial and possible implantation.  The patient has neurosurgery on his cervical spine in the past that was successful.  He also has L4/L5 fusion in the past and reports that his current troubles are related to issues at L3/L4.  He reports that he can not do anything except sit in a chair, go to bathroom and sit at table to eat.  He spends most of his time now in a lift chair.  He has tried "pain shots" in his back as well as nerve ablation.  The patient reports that his physical situation and pain cause him stress and a depressed state, but no prior history of depressive disorder.  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  Current Status:  The patient is dealing with chronic pain and significant physical limitations due to pain.  Reliability of Information: The information is  derived from 1 hour clinical interview and review of available medical records.  Behavioral Observation: PAULO KEIMIG  presents as a 76 y.o.-year-old Right Caucasian Male who appeared his stated age. his dress was Appropriate and he was Well Groomed and his manners were Appropriate to the situation.  his participation was indicative of Appropriate and Attentive behaviors.  There were physical disabilities noted by difficulty walking and using a cane as well as sitting posture.  he displayed an appropriate level of cooperation and motivation.     Interactions:    Active Appropriate and Attentive  Attention:   within normal limits and attention span and concentration were age appropriate  Memory:   within normal limits; recent and remote memory intact  Visuo-spatial:  within normal limits  Speech (Volume):  normal  Speech:   normal;   Thought Process:  Coherent and Relevant  Though Content:  WNL;   Orientation:   person, place, time/date and situation  Judgment:   Good  Planning:   Good  Affect:    Appropriate  Mood:    Depressed  Insight:   Good  Intelligence:   normal  Marital Status/Living: The patient was born and raised in [place].  He is currently married to his second wife and they are in good relations.  He still has good relationship and interaction with his first wife.  Current  Employment: The patient is retired.  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  Substance Use:  There is a documented history of alcohol abuse confirmed by the patient.  The patient has not had any alcohol for the past 25 years.  Education:   HS Graduate  Medical History:   Past Medical History:  Diagnosis Date  . Anemia    low iron  . ARF (acute renal failure) ([facility]) 12/2014  . Arthritis   . Bradycardia   . Chronic bronchitis ([facility])   . CKD (chronic kidney disease), stage III   . Colon polyp   . Depression    occassional.  Situational   . GERD (gastroesophageal reflux disease)   . Gout   . Gout   . History of blood transfusion   . History of GI bleed 2007  . Hypercholesteremia   . Hypertension   . Neuropathy ([facility])   . PAD (peripheral artery disease) ([facility])   . Pneumonia    denies  . Tobacco abuse disorder 03/10/2012  . Type II diabetes mellitus ([facility])   . Ulcer disease    Abuse/Trauma History: No reports of abuse or trauma in his life.  Psychiatric History:  The patient denies any prior psychiatric history.  Family Med/Psych History:  Family History  Problem Relation Age of Onset  . Colon cancer Paternal Uncle        Uncle  . Heart disease Father   . Hypertension Father   . Heart attack Father   . Heart disease Mother   . Diabetes Mother   . Hypertension Mother     Risk of Suicide/Violence: virtually non-existent Patient denies any SI or HI.  Impression/DX:  The patient is a 76 year old male with history of severe pain due to nerve issues, likely from nerve root involvement at L3/L4.  He reports that he was told by Dr. Trenton Gammon that he does not think further surgery is a good option.  The patient would like to have a spinal cord stimulator and was referred for this psychological evaluation.  He denies any past psychiatric history but is depressed by his current severe physical limitation due to pain.  There are no indications of any psychosocial issues that would negatively impact the trial period and overly impact possible implantation.  He does not use alcohol and while he does continue to use opiate medications there are no indications of abuse.    Disposition/Plan:  The patient will complete the MMPI-2 and the Pain Patient Profile as part of the current evaluation.  Diagnosis:    Chronic pain syndrome         Electronically Signed   _______________________ Ilean Skill, Psy.D.

## 2016-07-23 ENCOUNTER — Encounter: Payer: Self-pay | Admitting: Psychology

## 2016-07-23 ENCOUNTER — Encounter: Payer: Medicare Other | Attending: Psychology | Admitting: Psychology

## 2016-07-23 DIAGNOSIS — E1122 Type 2 diabetes mellitus with diabetic chronic kidney disease: Secondary | ICD-10-CM | POA: Diagnosis not present

## 2016-07-23 DIAGNOSIS — Z86010 Personal history of colonic polyps: Secondary | ICD-10-CM | POA: Insufficient documentation

## 2016-07-23 DIAGNOSIS — I129 Hypertensive chronic kidney disease with stage 1 through stage 4 chronic kidney disease, or unspecified chronic kidney disease: Secondary | ICD-10-CM | POA: Diagnosis not present

## 2016-07-23 DIAGNOSIS — E7800 Pure hypercholesterolemia, unspecified: Secondary | ICD-10-CM | POA: Insufficient documentation

## 2016-07-23 DIAGNOSIS — K219 Gastro-esophageal reflux disease without esophagitis: Secondary | ICD-10-CM | POA: Diagnosis not present

## 2016-07-23 DIAGNOSIS — N183 Chronic kidney disease, stage 3 (moderate): Secondary | ICD-10-CM | POA: Insufficient documentation

## 2016-07-23 DIAGNOSIS — I739 Peripheral vascular disease, unspecified: Secondary | ICD-10-CM | POA: Insufficient documentation

## 2016-07-23 DIAGNOSIS — M109 Gout, unspecified: Secondary | ICD-10-CM | POA: Insufficient documentation

## 2016-07-23 DIAGNOSIS — D509 Iron deficiency anemia, unspecified: Secondary | ICD-10-CM | POA: Insufficient documentation

## 2016-07-23 DIAGNOSIS — F329 Major depressive disorder, single episode, unspecified: Secondary | ICD-10-CM | POA: Diagnosis not present

## 2016-07-23 DIAGNOSIS — G894 Chronic pain syndrome: Secondary | ICD-10-CM | POA: Diagnosis not present

## 2016-07-23 NOTE — Progress Notes (Signed)
Neuropsychological Consultation   Patient:  Marc [name]   DOB: 1940-05-04  MR Number: [id number]  Location: [facility] [address] [place] [id number] [place] [address] Dept: [phone]  Start: 8 AM End: 9 AM  Provider/Observer:     Edgardo Roys PSYD  Chief Complaint:      Chief Complaint  Patient presents with  . Pain  . Back Pain  . Leg Pain   The patient has been dealing with Chronic Pain for some time.  The pain has also disturbed sleep and created insomnia.  These symptoms are reported to have started 01/15/2016  Reason For Service:     The patient is a 76 year old male referred by Dr. Maryjean Ka for a psychological evaluation as part of the standard protocol for consideration for Spinal Cord Stimulator Trial and possible implantation.  The patient has neurosurgery on his cervical spine in the past that was successful.  He also has L4/L5 fusion in the past and reports that his current troubles are related to issues at L3/L4.  He reports that he can not do anything except sit in a chair, go to bathroom and sit at table to eat.  He spends most of his time now in a lift chair.  He has tried "pain shots" in his back as well as nerve ablation.  The patient reports that his physical situation and pain cause him stress and a depressed state, but no prior history of depressive disorder.  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  Testing Administered:  Bent multiphasic personality inventory-2 and pain patient profile (P3)  Participation Level:   Active  Participation Quality:  Appropriate and Attentive      Behavioral Observation:  Well Groomed,  Alert, and Appropriate.   Test Results:   Initially, the patient completed the [place] multiphasic personality inventory-2. The patient produced a profile with regard to the validity scales that strongly suggest the patient approached this measure in an honest and straightforward manner neither attempting to place himself in an overly positive or negative light. This profile does appear to be a valid administration and completion of this objective inventory.  The resulting basic scale/clinical scales are all within normative ranges with the exception of descriptions of specific health related concerns and descriptions of health issues. The patient does not have any other clinical scales in the clinically elevated range. The patient does have 1 mild a less than significant elevation with regard to issues of internal anger and frustration. Further analysis utilizing content scales show that with the mild elevation on issues related to anger and frustration the patient denies any significant elements of anxiety, depression, psychotic features or significant psychosocial/family distress.  Supplementary scales show that the only elevation the patient produced was on the MacAndrews scale, which is a scale shown to research to be a predictor of vulnerability towards alcohol abuse. This correlates with the patient's description of his life history where he abused alcohol for many years. However, the patient reports credibly that he has not had a drink of alcohol for the past 25 years. The patient has no elevations on either of the PTSD scales and no indication of maladjustment  with regard to social skills and social interactions. In-depth analysis of clinical scales using the Harris lingoes scales show that he does describe issues related to physical malfunction but no issues of sadness, brooding, or significant anxiety. The patient does describe a number of somatic complaints and mild reduction of energy and  malaise. None of these items are in the clinically significant range. The patient feels somewhat alienated from his own prior functioning, which is likely a direct result of his current significant pain symptoms. Overall, there no indications of any significant psychiatric or psychological disorders presented on his MMPI profile.  The patient also completed the pain patient profile (P3) which is a objective measurement of clinical features such as somatic sedation, anxiety and depression nor him donate pain patient population without significant psychiatric illness. This profile is used to adjust to some of the features that can be elevated on the MMPI due to chronic pain. This profile is normal on both a pain patient population as well as a community-based normative sample. With regard to the somatizations scale the patient does have a significant elevation over the community-based normative sample but is actually below the nonpsychiatric pain patient sample. The patient shows a mean level of anxiety symptoms and depressive symptoms for the community sample and is well below those typically seen with a pain patient population. Therefore, the patient does not appear to be dealing with any significant anxiety or depression even after adjusting for his chronic pain symptoms.  Summary of Results:   The results of the current objective psychological evaluation utilizing both extensive clinical interview with the patient as well as objective psychological measures of the MMPI-2 and the P3 inventory are not indicative of any psychiatric illness or significant psychosocial conflicts or difficulties currently. The patient did have a long history of alcohol abuse that completely ended 25 years ago and he has been active in community service for the past 25 years. There no indications of significant depression, anxiety, bipolar disorder, or psychotic features. There are also no indications of pre-existing PTSD. The  patient does have elevations on indications of vulnerability towards alcohol abuse which is consistent with his prior history. He is not abusing alcohol in any way at the current time.  Impression/Diagnosis:   The results of the current psychological evaluation to strongly exist that from a psychological/psychiatric perspective but the patient is an excellent candidate for spinal cord stimulator trialing and possible implantation. There are no indications of significant psychiatric illness or significant psychosocial distress or difficulties that would negatively impact the trialing period.  The patient clearly appears to understand the potential risks and benefits of this procedure and has the cognitive abilities to make a wise, sound and informed decision.  Diagnosis:    Axis I: Chronic pain syndrome   Ilean Skill, Psy.D. Neuropsychologist

## 2016-09-05 ENCOUNTER — Other Ambulatory Visit: Payer: Self-pay | Admitting: Anesthesiology

## 2016-09-17 NOTE — Pre-Procedure Instructions (Signed)
Marc [name]  09/17/2016      [facility] - [place] - [address] [place] [address] Phone: [phone] Fax: [phone]  [place], [place] [address] [place] [address] Phone: [phone] Fax: [phone]    Your procedure is scheduled on September 20, 2016.  Report to [facility] Admitting at 530 AM.  Call this number if you have problems the morning of surgery:  [phone]   Remember:  Do not eat food or drink liquids after midnight.  Take these medicines the morning of surgery with A SIP OF WATER docusate sodium (colace), gabapentin (neurontin), oxycodone, pantoprazole (protonix).  Stop taking Plavix as instructed by your surgeon  7 days prior to surgery STOP taking any  Aspirin, Aleve, Naproxen, Ibuprofen, Motrin, Advil, Goody's, BC's, all herbal medications, fish oil, and all vitamins    How to Manage Your Diabetes Before and After Surgery  Why is it important to control my blood sugar before and after surgery? . Improving blood sugar levels before and after surgery helps healing and can limit problems. . A way of improving blood sugar control is eating a healthy diet by: o  Eating less sugar and carbohydrates o  Increasing activity/exercise o  Talking with your doctor about reaching your blood sugar goals . High blood sugars (greater than 180 mg/dL) can raise your risk of infections and slow your recovery, so you will need to focus on controlling your diabetes during the weeks before surgery. . Make sure that the doctor who takes care of your diabetes knows about your planned surgery including the date and location.  How do I manage my blood sugar before surgery? . Check your blood sugar at least 4 times a day, starting 2 days before surgery, to make sure that the level is not too high or low. o Check your  blood sugar the morning of your surgery when you wake up and every 2 hours until you get to the Short Stay unit. . If your blood sugar is less than 70 mg/dL, you will need to treat for low blood sugar: o Do not take insulin. o Treat a low blood sugar (less than 70 mg/dL) with  cup of clear juice (cranberry or apple), 4 glucose tablets, OR glucose gel. o Recheck blood sugar in 15 minutes after treatment (to make sure it is greater than 70 mg/dL). If your blood sugar is not greater than 70 mg/dL on recheck, call [phone] for further instructions. . Report your blood sugar to the short stay nurse when you get to Short Stay.  . If you are admitted to the hospital after surgery: o Your blood sugar will be checked by the staff and you will probably be given insulin after surgery (instead of oral diabetes medicines) to make sure you have good blood sugar levels. o The goal for blood sugar control after surgery is 80-180 mg/dL.      WHAT DO I DO ABOUT MY DIABETES MEDICATION?   [name] Do not take oral diabetes medicines (pills) the morning of surgery.  . The day of surgery, do not take other diabetes injectables, including Byetta (exenatide), Bydureon (exenatide ER), Victoza (liraglutide), or Trulicity (dulaglutide).  . If your CBG is greater than 220 mg/dL, you may take  of your sliding scale (correction) dose of insulin.   Reviewed and Endorsed by [facility] Patient Education Committee, August 2015   Do not wear jewelry, make-up or nail polish.  Do not wear lotions, powders, or perfumes, or deoderant.  Do not shave 48 hours prior to surgery.  Men may shave face and neck.  Do not bring valuables to the hospital.  [facility] is not responsible for any belongings or valuables.  Contacts, dentures or bridgework may not be worn into surgery.  Leave your suitcase in the car.  After surgery it may be brought to your room.  For patients admitted to the hospital, discharge time will be  determined by your treatment team.  Patients discharged the day of surgery will not be allowed to drive home.   Special instructions:  [place]- Preparing For Surgery  Before surgery, you can play an important role. Because skin is not sterile, your skin needs to be as free of germs as possible. You can reduce the number of germs on your skin by washing with CHG (chlorahexidine gluconate) Soap before surgery.  CHG is an antiseptic cleaner which kills germs and bonds with the skin to continue killing germs even after washing.  Please do not use if you have an allergy to CHG or antibacterial soaps. If your skin becomes reddened/irritated stop using the CHG.  Do not shave (including legs and underarms) for at least 48 hours prior to first CHG shower. It is OK to shave your face.  Please follow these instructions carefully.   1. Shower the NIGHT BEFORE SURGERY and the MORNING OF SURGERY with CHG.   2. If you chose to wash your hair, wash your hair first as usual with your normal shampoo.  3. After you shampoo, rinse your hair and body thoroughly to remove the shampoo.  4. Use CHG as you would any other liquid soap. You can apply CHG directly to the skin and wash gently with a scrungie or a clean washcloth.   5. Apply the CHG Soap to your body ONLY FROM THE NECK DOWN.  Do not use on open wounds or open sores. Avoid contact with your eyes, ears, mouth and genitals (private parts). Wash genitals (private parts) with your normal soap.  6. Wash thoroughly, paying special attention to the area where your surgery will be performed.  7. Thoroughly rinse your body with warm water from the neck down.  8. DO NOT shower/wash with your normal soap after using and rinsing off the CHG Soap.  9. Pat yourself dry with a CLEAN TOWEL.   10. Wear CLEAN PAJAMAS   11. Place CLEAN SHEETS on your bed the night of your first shower and DO NOT SLEEP WITH PETS.    Day of Surgery: Do not apply any  deodorants/lotions. Please wear clean clothes to the hospital/surgery center.     Please read over the following fact sheets that you were given. Pain Booklet, Coughing and Deep Breathing, MRSA Information and Surgical Site Infection Prevention

## 2016-09-18 ENCOUNTER — Encounter (HOSPITAL_COMMUNITY)
Admission: RE | Admit: 2016-09-18 | Discharge: 2016-09-18 | Disposition: A | Discharge status: Home / Self Care | Payer: Medicare Other | Source: Ambulatory Visit | Attending: Anesthesiology | Admitting: Anesthesiology

## 2016-09-18 ENCOUNTER — Encounter (HOSPITAL_COMMUNITY): Payer: Self-pay

## 2016-09-18 DIAGNOSIS — F419 Anxiety disorder, unspecified: Secondary | ICD-10-CM | POA: Diagnosis not present

## 2016-09-18 DIAGNOSIS — Z809 Family history of malignant neoplasm, unspecified: Secondary | ICD-10-CM | POA: Diagnosis not present

## 2016-09-18 DIAGNOSIS — K219 Gastro-esophageal reflux disease without esophagitis: Secondary | ICD-10-CM | POA: Diagnosis not present

## 2016-09-18 DIAGNOSIS — E7800 Pure hypercholesterolemia, unspecified: Secondary | ICD-10-CM | POA: Diagnosis not present

## 2016-09-18 DIAGNOSIS — Z96651 Presence of right artificial knee joint: Secondary | ICD-10-CM | POA: Diagnosis not present

## 2016-09-18 DIAGNOSIS — Z981 Arthrodesis status: Secondary | ICD-10-CM | POA: Diagnosis not present

## 2016-09-18 DIAGNOSIS — Z8249 Family history of ischemic heart disease and other diseases of the circulatory system: Secondary | ICD-10-CM | POA: Diagnosis not present

## 2016-09-18 DIAGNOSIS — Z9081 Acquired absence of spleen: Secondary | ICD-10-CM | POA: Diagnosis not present

## 2016-09-18 DIAGNOSIS — Z833 Family history of diabetes mellitus: Secondary | ICD-10-CM | POA: Diagnosis not present

## 2016-09-18 DIAGNOSIS — Z86010 Personal history of colonic polyps: Secondary | ICD-10-CM | POA: Diagnosis not present

## 2016-09-18 DIAGNOSIS — L409 Psoriasis, unspecified: Secondary | ICD-10-CM | POA: Diagnosis not present

## 2016-09-18 DIAGNOSIS — Z89421 Acquired absence of other right toe(s): Secondary | ICD-10-CM | POA: Diagnosis not present

## 2016-09-18 DIAGNOSIS — Z8711 Personal history of peptic ulcer disease: Secondary | ICD-10-CM | POA: Diagnosis not present

## 2016-09-18 DIAGNOSIS — Z800 Family history of malignant neoplasm of digestive organs: Secondary | ICD-10-CM | POA: Diagnosis not present

## 2016-09-18 DIAGNOSIS — I129 Hypertensive chronic kidney disease with stage 1 through stage 4 chronic kidney disease, or unspecified chronic kidney disease: Secondary | ICD-10-CM | POA: Diagnosis not present

## 2016-09-18 DIAGNOSIS — M109 Gout, unspecified: Secondary | ICD-10-CM | POA: Diagnosis not present

## 2016-09-18 DIAGNOSIS — E1122 Type 2 diabetes mellitus with diabetic chronic kidney disease: Secondary | ICD-10-CM | POA: Diagnosis not present

## 2016-09-18 DIAGNOSIS — M5416 Radiculopathy, lumbar region: Secondary | ICD-10-CM | POA: Diagnosis not present

## 2016-09-18 DIAGNOSIS — F329 Major depressive disorder, single episode, unspecified: Secondary | ICD-10-CM | POA: Diagnosis not present

## 2016-09-18 DIAGNOSIS — G894 Chronic pain syndrome: Secondary | ICD-10-CM | POA: Diagnosis not present

## 2016-09-18 DIAGNOSIS — N183 Chronic kidney disease, stage 3 (moderate): Secondary | ICD-10-CM | POA: Diagnosis not present

## 2016-09-18 DIAGNOSIS — Z89411 Acquired absence of right great toe: Secondary | ICD-10-CM | POA: Diagnosis not present

## 2016-09-18 DIAGNOSIS — R001 Bradycardia, unspecified: Secondary | ICD-10-CM | POA: Diagnosis not present

## 2016-09-18 DIAGNOSIS — E1140 Type 2 diabetes mellitus with diabetic neuropathy, unspecified: Secondary | ICD-10-CM | POA: Diagnosis not present

## 2016-09-18 LAB — BASIC METABOLIC PANEL
ANION GAP: 11 (ref 5–15)
BUN: 18 mg/dL (ref 6–20)
CALCIUM: 9.2 mg/dL (ref 8.9–10.3)
CHLORIDE: 96 mmol/L — AB (ref 101–111)
CO2: 28 mmol/L (ref 22–32)
CREATININE: 1.8 mg/dL — AB (ref 0.61–1.24)
GFR calc non Af Amer: 35 mL/min — ABNORMAL LOW (ref >60)
GFR, EST AFRICAN AMERICAN: 40 mL/min — AB (ref >60)
Glucose, Bld: 246 mg/dL — ABNORMAL HIGH (ref 65–99)
Potassium: 4.1 mmol/L (ref 3.5–5.1)
SODIUM: 135 mmol/L (ref 135–145)

## 2016-09-18 LAB — CBC
HCT: 40.6 % (ref 39.0–52.0)
HEMOGLOBIN: 14 g/dL (ref 13.0–17.0)
MCH: 33.4 pg (ref 26.0–34.0)
MCHC: 34.5 g/dL (ref 30.0–36.0)
MCV: 96.9 fL (ref 78.0–100.0)
PLATELETS: 280 10*3/uL (ref 150–400)
RBC: 4.19 MIL/uL — AB (ref 4.22–5.81)
RDW: 15 % (ref 11.5–15.5)
WBC: 9 10*3/uL (ref 4.0–10.5)

## 2016-09-18 LAB — HEMOGLOBIN A1C
HEMOGLOBIN A1C: 6.4 % — AB (ref 4.8–5.6)
MEAN PLASMA GLUCOSE: 136.98 mg/dL

## 2016-09-18 LAB — SURGICAL PCR SCREEN
MRSA, PCR: NEGATIVE
Staphylococcus aureus: NEGATIVE

## 2016-09-18 LAB — PROTIME-INR
INR: 1.01
Prothrombin Time: 13.3 seconds (ref 11.4–15.2)

## 2016-09-18 LAB — APTT: aPTT: 37 seconds — ABNORMAL HIGH (ref 24–36)

## 2016-09-18 LAB — GLUCOSE, CAPILLARY: Glucose-Capillary: 255 mg/dL — ABNORMAL HIGH (ref 65–99)

## 2016-09-18 MED ORDER — CHLORHEXIDINE GLUCONATE CLOTH 2 % EX PADS: 6.0000 | MEDICATED_PAD | Freq: Once | CUTANEOUS | Status: DC

## 2016-09-18 NOTE — Progress Notes (Signed)
PCP: Dr. Claris Gower  Cardiologist: Dr. Jari Pigg  EKG: 01/15/2016  Stress test: 5+ years  ECHO: 07/26/2005  Cardiac Cath: pt denies ever  Chest x-ray: pt denies past year, no recent respiratory infection/complications

## 2016-09-19 NOTE — Anesthesia Preprocedure Evaluation (Addendum)
Anesthesia Evaluation  Patient identified by MRN, date of birth, ID band Patient awake    Reviewed: Allergy & Precautions, H&P , NPO status , Patient's Chart, lab work & pertinent test results  Airway Mallampati: III  TM Distance: >3 FB Neck ROM: Full    Dental no notable dental hx. (+) Upper Dentures, Lower Dentures, Dental Advisory Given   Pulmonary Current Smoker,    Pulmonary exam normal breath sounds clear to auscultation       Cardiovascular Exercise Tolerance: Good hypertension, Pt. on medications + Peripheral Vascular Disease and +CHF   Rhythm:Regular Rate:Normal     Neuro/Psych Depression negative neurological ROS  negative psych ROS   GI/Hepatic Neg liver ROS, GERD  Medicated and Controlled,  Endo/Other  diabetes, Type 2, Oral Hypoglycemic Agents  Renal/GU Renal InsufficiencyRenal disease  negative genitourinary   Musculoskeletal  (+) Arthritis , Osteoarthritis,    Abdominal   Peds  Hematology negative hematology ROS (+) anemia ,   Anesthesia Other Findings   Reproductive/Obstetrics negative OB ROS                           Anesthesia Physical Anesthesia Plan  ASA: III  Anesthesia Plan: MAC   Post-op Pain Management:    Induction: Intravenous  PONV Risk Score and Plan: 1 and Ondansetron and Dexamethasone  Airway Management Planned: Nasal Cannula  Additional Equipment:   Intra-op Plan:   Post-operative Plan:   Informed Consent: I have reviewed the patients History and Physical, chart, labs and discussed the procedure including the risks, benefits and alternatives for the proposed anesthesia with the patient or authorized representative who has indicated his/her understanding and acceptance.   Dental advisory given  Plan Discussed with: CRNA, Anesthesiologist and Surgeon  Anesthesia Plan Comments:        Anesthesia Quick Evaluation

## 2016-09-19 NOTE — H&P (Signed)
Marc [name] is an 76 y.o. male.   Chief Complaint: Back pain with radiation into the lower extremities HPI: Marc [name] is an entertaining 76 year old Caucasian gentleman referred to our clinic by Dr. Mina Marble and Dr. Lynann Bologna for consideration of spinal cord stimulation therapy.  Patient has a past medical history significant for anxiety, depression, hypertension, high cholesterol, diabetes, arthritis, gout and psoriasis.  The patient initially had cervical surgery with Dr. Annette Stable 10 years ago.  He was extremely pleased with this and states he has never had any problems with his neck since.  He then had a knee replacement with Dr. Lynann Bologna which he has had some continued pain since.  Apparently when he began having back pain they were unable to get him seen with Dr. Annette Stable so his primary care physician had him returned back to go for Orthopedics where he was an established patient. Patient has now had a fusion at L4-5 as well as a fusion at L3-4 which was done in 2015.  Unfortunately the patient has had continued pain since.  He is extremely frustrated with his current pain as well as his level of care with [facility].  He has tried multiple medications to include narcotics, neuro modulators and anti-inflammatories.  He was previously on oxycodone 15 mg every 6 hr from his primary care physician but states that that was recently increased oxycodone 30 mg which he can utilize twice a day.  He states that he does not like the medication.  He does not find that it is largely helpful and reports significant constipation with it.  He is also on Valium for muscle spasms.  He was previously on indomethacin which he stated worked quite well but then his primary care physician stop that medication.  He was very annoyed by this and ultimately was able to get Dumonski's PA to take over prescribing.  He states this medication works quite well for him.  He feels it reduces his pain by about 50% allows his oxycodone to  work more patient we.  The patient has also been on prednisone but did very badly with it.  He reported significant mood changes and irritability.  He had to stop that medication.  He has had multiple injections to include facet blocks, medial branch blocks, epidural steroid injections and radiofrequency ablation procedure. Patient states that 1 facet block provided him excellent relief for about 2 days before his symptoms returned full force.  He received no benefit from any other procedure. He feels this was acute waste of time and money.  The patient does continue to smoke about a pack a day.  Today he continues to report pain located in his low back which became most severe in December of 2017.  He reports little to no radiation of pain. He occasionally has some radiation of pain into his buttocks but other than that the pain is located in the center of his low back.  He rates his pain approximately an 8/10.  He describes it as a dull achy heaviness in his low back.  He also has some occasional sharp pains.  Even while seated in the exam room chair simple movements create obvious discomfort.  He finds the pain extremely limiting.  Any movement at all elicits pain.  If he is seated in his recliner he can sometimes get some rest but ultimately is unable to sleep in a bed.  He reports no bowel or bladder dysfunction but does have significant limitations with movement.  He can sit for long periods of time he cannot stand for more than about 3-4 minutes or walk more than 1-2 minutes. He states that while he is 76 years old there are a lot of things he is still capable of doing and he would like to be able to do them.  He feels extremely limited by his pain.  His wife was also recently hospitalized due to a COPD exacerbation.  He relies on her heavily to help take care of him.  The roles will now be reversed and he is concerned about being able to provide for her.  The patient does have significant weakness noted  in the right lower extremity.  Range of motion of the right hip does not seem to elicit any further pain in the low back.  It does however aggravate the knee.  He has a negative straight leg raise bilaterally.  His strength and sensation appear to be intact in the left leg.  He has difficulty going from a seated to standing position. He has to utilize a cane for balance during ambulation.  Every few steps he has to stop due to pain. No clonus is noted.  No abnormalities can be seen or felt on his low back he does have previous surgical scars.   based on this history, he underwent a S CS trial which was extremely successful with greater than 50 percent reduction in his pain symptoms, and increased energy, ability to do tasks of daily living.  He now presents for permanent implantation.   Past Medical History:  Diagnosis Date  . Anemia    low iron  . ARF (acute renal failure) ([place]) 12/2014  . Arthritis   . Bradycardia   . Chronic bronchitis ([place])   . CKD (chronic kidney disease), stage III   . Colon polyp   . Depression    occassional.  Situational  . GERD (gastroesophageal reflux disease)   . Gout   . Gout   . History of blood transfusion   . History of GI bleed 2007  . Hypercholesteremia   . Hypertension   . Neuropathy   . PAD (peripheral artery disease) ([place])   . Pneumonia    denies  . Tobacco abuse disorder 03/10/2012  . Type II diabetes mellitus ([place])   . Ulcer disease     Past Surgical History:  Procedure Laterality Date  . AMPUTATION Right 02/07/2015   Procedure: GREAT TOE RAY AMPUTATION RIGHT ;  Surgeon: Angelia [name], MD;  Location: [place];  Service: Vascular;  Laterality: Right;  . AMPUTATION TOE Right 03/21/2015   Procedure: AMPUTATION TOE-RIGHT SECOND TOE;  Surgeon: Angelia [name], MD;  Location: [place];  Service: Vascular;  Laterality: Right;  . ANTERIOR CERVICAL DECOMP/DISCECTOMY FUSION  ~ 2007  . ANTERIOR LAT LUMBAR FUSION Left 01/19/2014   Procedure:  LATERAL INTERBODY FUSION 1 LEVEL;  Surgeon: Sinclair Ship, MD;  Location: [place];  Service: Orthopedics;  Laterality: Left;  Left lumbar 3-4 lateral interbody fusion with instrumentation, allograft  . APPLICATION OF WOUND VAC Right 02/07/2015   Procedure:  APPLICATION OF WOUND VAC right great toe amputation site.;  Surgeon: Angelia [name], MD;  Location: [place];  Service: Vascular;  Laterality: Right;  . APPLICATION OF WOUND VAC Right 03/21/2015   Procedure: APPLICATION OF WOUND VAC RIGHT FOOT;  Surgeon: Angelia [name], MD;  Location: [place];  Service: Vascular;  Laterality: Right;  . BACK SURGERY    . BYPASS  GRAFT FEMORAL-PERONEAL Right 02/07/2015   Procedure: BYPASS GRAFT FEMORAL-PERONEAL Trunk WITH VEIN graft  right leg.;  Surgeon: Angelia [name], MD;  Location: [place];  Service: Vascular;  Laterality: Right;  . COLONOSCOPY    . ENDARTERECTOMY TIBIOPERONEAL Right 02/07/2015   Procedure: ENDARTERECTOMY TIBIOPERONEAL;  Surgeon: Angelia [name], MD;  Location: [place];  Service: Vascular;  Laterality: Right;  . EXCISIONAL TOTAL KNEE ARTHROPLASTY WITH ANTIBIOTIC SPACERS Right 04/14/2015   Procedure: RIGHT KNEE Katherine OUT AND PLACEMENT OF DRAINS;  Surgeon: Frederik Pear, MD;  Location: [place];  Service: Orthopedics;  Laterality: Right;  . EYE SURGERY Bilateral    cataract surgery with lens implant  . HERNIA REPAIR  ~ 2007   UHR (02/04/2012)  . ILIAC ARTERY STENT Left    left common/notes (02/04/2012)  . INTRAOPERATIVE ARTERIOGRAM Right 02/07/2015   Procedure: INTRA OPERATIVE ARTERIOGRAM;  Surgeon: Angelia [name], MD;  Location: [facility] [place];  Service: Vascular;  Laterality: Right;  . IRRIGATION AND DEBRIDEMENT KNEE Right 04/14/2015   Procedure: IRRIGATION AND DEBRIDEMENT KNEE;  Surgeon: Frederik Pear, MD;  Location: [employer];  Service: Orthopedics;  Laterality: Right;  . LOWER EXTREMITY ANGIOGRAM N/A 02/04/2012   Procedure: LOWER EXTREMITY ANGIOGRAM;  Surgeon: Lorretta Harp, MD;   Location: [facility] CATH LAB;  Service: Cardiovascular;  Laterality: N/A;  . LUMBAR LAMINECTOMY  11/14  . PATCH ANGIOPLASTY Right 02/07/2015   Procedure: Vein PATCH ANGIOPLASTY to tibioperoneal trunk;  Surgeon: Angelia [name], MD;  Location: [place];  Service: Vascular;  Laterality: Right;  . PERCUTANEOUS STENT INTERVENTION Left 02/04/2012   Procedure: PERCUTANEOUS STENT INTERVENTION;  Surgeon: Lorretta Harp, MD;  Location: [facility] CATH LAB;  Service: Cardiovascular;  Laterality: Left;  lt ext iliac stent  . PERIPHERAL VASCULAR CATHETERIZATION N/A 02/02/2015   Procedure: Abdominal Aortogram w/Lower Extremity;  Surgeon: Angelia [name], MD;  Location: [place] CV LAB;  Service: Cardiovascular;  Laterality: N/A;  . POLYPECTOMY    . REPLACEMENT TOTAL KNEE Right   . SPLENECTOMY  ~ 1957  . TONSILLECTOMY  ~ 1947  . TOTAL KNEE ARTHROPLASTY Right 02/03/2013   Procedure: TOTAL KNEE ARTHROPLASTY;  Surgeon: [name], MD;  Location: [place];  Service: Orthopedics;  Laterality: Right;  . UPPER ENDOSCOPY W/ SCLEROTHERAPY  ~ 2007  . WOUND DEBRIDEMENT Right 03/21/2015   Procedure: DEBRIDEMENT WOUND of RIGHT FOOT;  Surgeon: Angelia [name], MD;  Location: [facility];  Service: Vascular;  Laterality: Right;    Family History  Problem Relation Age of Onset  . Colon cancer Paternal Uncle        Uncle  . Heart disease Father   . Hypertension Father   . Heart attack Father   . Heart disease Mother   . Diabetes Mother   . Hypertension Mother    Social History:  reports that he has been smoking Cigarettes.  He has a 58.00 pack-year smoking history. He has quit using smokeless tobacco. His smokeless tobacco use included Chew. He reports that he does not drink alcohol or use drugs.  Allergies:  Allergies  Allergen Reactions  . Actos [Pioglitazone] Swelling and Other (See Comments)    LOWER EXTREMITY EDEMA     Medications Prior to Admission  Medication Sig Dispense Refill  . allopurinol (ZYLOPRIM)  300 MG tablet Take 300 mg by mouth at bedtime.     [name] aspirin EC 325 MG tablet Take 1 tablet (325 mg total) by mouth 2 (two) times daily. 30 tablet 0  .  atorvastatin (LIPITOR) 10 MG tablet Take 10 mg by mouth daily.    . clopidogrel (PLAVIX) 75 MG tablet Take 75 mg by mouth daily.     . colchicine 0.6 MG tablet Take 0.6 mg by mouth daily.    [name] docusate sodium (COLACE) 100 MG capsule Take 100 mg by mouth 2 (two) times daily.    [name] doxycycline (MONODOX) 100 MG capsule Take 1 capsule (100 mg total) by mouth 2 (two) times daily. 180 capsule 3  . furosemide (LASIX) 80 MG tablet Take 1 tablet (80 mg total) by mouth daily. 5 tablet 0  . gabapentin (NEURONTIN) 600 MG tablet Take 1,200 mg by mouth every 12 (twelve) hours.     [name] glimepiride (AMARYL) 4 MG tablet Take 8 mg by mouth daily with breakfast.     . lisinopril (PRINIVIL,ZESTRIL) 20 MG tablet Take 20 mg by mouth daily.    [name] oxycodone (ROXICODONE) 30 MG immediate release tablet Take 30 mg by mouth 2 (two) times daily.    . pantoprazole (PROTONIX) 40 MG tablet Take 40 mg by mouth daily.    . Prenatal Vit-Fe Fumarate-FA (MULTIVITAMIN-PRENATAL) 27-0.8 MG TABS tablet Take 1 tablet by mouth daily at 12 noon.      Results for orders placed or performed during the hospital encounter of 09/20/16 (from the past 48 hour(s))  Glucose, capillary     Status: Abnormal   Collection Time: 09/20/16  6:18 AM  Result Value Ref Range   Glucose-Capillary 119 (H) 65 - 99 mg/dL   Comment 1 Notify RN    No results found.  Review of Systems  Constitutional: Negative.   HENT: Positive for hearing loss. Negative for congestion, ear discharge, nosebleeds, sinus pain, sore throat and tinnitus.   Eyes: Negative.   Respiratory: Negative.   Cardiovascular: Negative.   Gastrointestinal: Negative.   Genitourinary: Negative.   Musculoskeletal: Positive for back pain. Negative for falls and myalgias.  Skin: Negative.   Endo/Heme/Allergies: Negative.    Psychiatric/Behavioral: Negative.     Blood pressure (!) 155/78, pulse 68, temperature 98.2 F (36.8 C), temperature source Oral, resp. rate 19, SpO2 100 %. Physical Exam  Constitutional: He is oriented to person, place, and time. He appears well-developed and well-nourished.  HENT:  Head: Normocephalic and atraumatic.  Eyes: Pupils are equal, round, and reactive to light.  Neck: Normal range of motion.  Cardiovascular: Normal rate and regular rhythm.   Musculoskeletal: Normal range of motion.  Neurological: He is alert and oriented to person, place, and time.  Skin: Skin is warm and dry.  Psychiatric: He has a normal mood and affect. His behavior is normal. Judgment and thought content normal.     Assessment/Plan  lumbar post-laminectomy syndrome Chronic pain syndrome Plan:  Permanent implantation, SCS, [employer]  Bonna Gains, MD 09/20/2016, 7:31 AM

## 2016-09-20 ENCOUNTER — Ambulatory Visit (HOSPITAL_COMMUNITY): Payer: Medicare Other

## 2016-09-20 ENCOUNTER — Ambulatory Visit (HOSPITAL_COMMUNITY)
Admission: RE | Admit: 2016-09-20 | Discharge: 2016-09-20 | Disposition: A | Discharge status: Home / Self Care | Payer: Medicare Other | Source: Ambulatory Visit | Attending: Anesthesiology | Admitting: Anesthesiology

## 2016-09-20 ENCOUNTER — Encounter (HOSPITAL_COMMUNITY): Payer: Self-pay | Admitting: *Deleted

## 2016-09-20 ENCOUNTER — Ambulatory Visit (HOSPITAL_COMMUNITY): Payer: Medicare Other | Admitting: Anesthesiology

## 2016-09-20 ENCOUNTER — Encounter (HOSPITAL_COMMUNITY)
Admission: RE | Disposition: A | Discharge status: Home / Self Care | Payer: Self-pay | Source: Ambulatory Visit | Attending: Anesthesiology

## 2016-09-20 DIAGNOSIS — Z833 Family history of diabetes mellitus: Secondary | ICD-10-CM | POA: Insufficient documentation

## 2016-09-20 DIAGNOSIS — Z8249 Family history of ischemic heart disease and other diseases of the circulatory system: Secondary | ICD-10-CM | POA: Insufficient documentation

## 2016-09-20 DIAGNOSIS — F17210 Nicotine dependence, cigarettes, uncomplicated: Secondary | ICD-10-CM | POA: Insufficient documentation

## 2016-09-20 DIAGNOSIS — Z981 Arthrodesis status: Secondary | ICD-10-CM | POA: Insufficient documentation

## 2016-09-20 DIAGNOSIS — Z7982 Long term (current) use of aspirin: Secondary | ICD-10-CM | POA: Insufficient documentation

## 2016-09-20 DIAGNOSIS — L409 Psoriasis, unspecified: Secondary | ICD-10-CM | POA: Insufficient documentation

## 2016-09-20 DIAGNOSIS — Z888 Allergy status to other drugs, medicaments and biological substances status: Secondary | ICD-10-CM | POA: Insufficient documentation

## 2016-09-20 DIAGNOSIS — Z89421 Acquired absence of other right toe(s): Secondary | ICD-10-CM | POA: Insufficient documentation

## 2016-09-20 DIAGNOSIS — I129 Hypertensive chronic kidney disease with stage 1 through stage 4 chronic kidney disease, or unspecified chronic kidney disease: Secondary | ICD-10-CM | POA: Insufficient documentation

## 2016-09-20 DIAGNOSIS — M1990 Unspecified osteoarthritis, unspecified site: Secondary | ICD-10-CM | POA: Insufficient documentation

## 2016-09-20 DIAGNOSIS — Z96651 Presence of right artificial knee joint: Secondary | ICD-10-CM | POA: Insufficient documentation

## 2016-09-20 DIAGNOSIS — Z79899 Other long term (current) drug therapy: Secondary | ICD-10-CM | POA: Insufficient documentation

## 2016-09-20 DIAGNOSIS — M109 Gout, unspecified: Secondary | ICD-10-CM | POA: Insufficient documentation

## 2016-09-20 DIAGNOSIS — Z9081 Acquired absence of spleen: Secondary | ICD-10-CM | POA: Insufficient documentation

## 2016-09-20 DIAGNOSIS — Z89411 Acquired absence of right great toe: Secondary | ICD-10-CM | POA: Insufficient documentation

## 2016-09-20 DIAGNOSIS — G894 Chronic pain syndrome: Secondary | ICD-10-CM | POA: Insufficient documentation

## 2016-09-20 DIAGNOSIS — F329 Major depressive disorder, single episode, unspecified: Secondary | ICD-10-CM | POA: Insufficient documentation

## 2016-09-20 DIAGNOSIS — M5416 Radiculopathy, lumbar region: Secondary | ICD-10-CM | POA: Diagnosis not present

## 2016-09-20 DIAGNOSIS — E7800 Pure hypercholesterolemia, unspecified: Secondary | ICD-10-CM | POA: Insufficient documentation

## 2016-09-20 DIAGNOSIS — E1122 Type 2 diabetes mellitus with diabetic chronic kidney disease: Secondary | ICD-10-CM | POA: Insufficient documentation

## 2016-09-20 DIAGNOSIS — Z86010 Personal history of colonic polyps: Secondary | ICD-10-CM | POA: Insufficient documentation

## 2016-09-20 DIAGNOSIS — K219 Gastro-esophageal reflux disease without esophagitis: Secondary | ICD-10-CM | POA: Insufficient documentation

## 2016-09-20 DIAGNOSIS — R001 Bradycardia, unspecified: Secondary | ICD-10-CM | POA: Insufficient documentation

## 2016-09-20 DIAGNOSIS — E1140 Type 2 diabetes mellitus with diabetic neuropathy, unspecified: Secondary | ICD-10-CM | POA: Insufficient documentation

## 2016-09-20 DIAGNOSIS — J42 Unspecified chronic bronchitis: Secondary | ICD-10-CM | POA: Insufficient documentation

## 2016-09-20 DIAGNOSIS — Z809 Family history of malignant neoplasm, unspecified: Secondary | ICD-10-CM | POA: Insufficient documentation

## 2016-09-20 DIAGNOSIS — I739 Peripheral vascular disease, unspecified: Secondary | ICD-10-CM | POA: Insufficient documentation

## 2016-09-20 DIAGNOSIS — Z9689 Presence of other specified functional implants: Secondary | ICD-10-CM

## 2016-09-20 DIAGNOSIS — M961 Postlaminectomy syndrome, not elsewhere classified: Secondary | ICD-10-CM | POA: Insufficient documentation

## 2016-09-20 DIAGNOSIS — N183 Chronic kidney disease, stage 3 (moderate): Secondary | ICD-10-CM | POA: Insufficient documentation

## 2016-09-20 DIAGNOSIS — Z800 Family history of malignant neoplasm of digestive organs: Secondary | ICD-10-CM | POA: Insufficient documentation

## 2016-09-20 DIAGNOSIS — Z7984 Long term (current) use of oral hypoglycemic drugs: Secondary | ICD-10-CM | POA: Insufficient documentation

## 2016-09-20 DIAGNOSIS — F419 Anxiety disorder, unspecified: Secondary | ICD-10-CM | POA: Insufficient documentation

## 2016-09-20 DIAGNOSIS — Z8711 Personal history of peptic ulcer disease: Secondary | ICD-10-CM | POA: Insufficient documentation

## 2016-09-20 DIAGNOSIS — D649 Anemia, unspecified: Secondary | ICD-10-CM | POA: Insufficient documentation

## 2016-09-20 HISTORY — PX: SPINAL CORD STIMULATOR INSERTION: SHX5378

## 2016-09-20 HISTORY — DX: Presence of other specified functional implants: Z96.89

## 2016-09-20 LAB — GLUCOSE, CAPILLARY
Glucose-Capillary: 119 mg/dL — ABNORMAL HIGH (ref 65–99)
Glucose-Capillary: 148 mg/dL — ABNORMAL HIGH (ref 65–99)

## 2016-09-20 SURGERY — INSERTION, SPINAL CORD STIMULATOR, LUMBAR
Anesthesia: Monitor Anesthesia Care

## 2016-09-20 MED ORDER — CEFAZOLIN SODIUM-DEXTROSE 2-4 GM/100ML-% IV SOLN
INTRAVENOUS | Status: AC
  Filled 2016-09-20: qty 100

## 2016-09-20 MED ORDER — CEPHALEXIN 500 MG PO CAPS: 500.0000 mg | ORAL_CAPSULE | Freq: Three times a day (TID) | ORAL | 0 refills | Status: DC

## 2016-09-20 MED ORDER — CEFAZOLIN SODIUM-DEXTROSE 2-4 GM/100ML-% IV SOLN
2.0000 g | INTRAVENOUS | Status: AC
  Administered 2016-09-20: 2 g via INTRAVENOUS

## 2016-09-20 MED ORDER — SUCCINYLCHOLINE CHLORIDE 200 MG/10ML IV SOSY
PREFILLED_SYRINGE | INTRAVENOUS | Status: AC
  Filled 2016-09-20: qty 10

## 2016-09-20 MED ORDER — PROPOFOL 500 MG/50ML IV EMUL
INTRAVENOUS | Status: DC | PRN
  Administered 2016-09-20: 25 ug/kg/min via INTRAVENOUS

## 2016-09-20 MED ORDER — EPHEDRINE 5 MG/ML INJ
INTRAVENOUS | Status: AC
  Filled 2016-09-20: qty 10

## 2016-09-20 MED ORDER — OXYCODONE HCL 30 MG PO TABS: 30.0000 mg | ORAL_TABLET | Freq: Four times a day (QID) | ORAL | 0 refills | Status: DC | PRN

## 2016-09-20 MED ORDER — ONDANSETRON HCL 4 MG/2ML IJ SOLN
INTRAMUSCULAR | Status: AC
  Filled 2016-09-20: qty 2

## 2016-09-20 MED ORDER — BUPIVACAINE-EPINEPHRINE (PF) 0.5% -1:200000 IJ SOLN
INTRAMUSCULAR | Status: AC
  Filled 2016-09-20: qty 30

## 2016-09-20 MED ORDER — PROPOFOL 10 MG/ML IV BOLUS
INTRAVENOUS | Status: AC
  Filled 2016-09-20: qty 20

## 2016-09-20 MED ORDER — FENTANYL CITRATE (PF) 100 MCG/2ML IJ SOLN: 25.0000 ug | INTRAMUSCULAR | Status: DC | PRN

## 2016-09-20 MED ORDER — BUPIVACAINE-EPINEPHRINE (PF) 0.5% -1:200000 IJ SOLN
INTRAMUSCULAR | Status: DC | PRN
  Administered 2016-09-20: 38 mL via PERINEURAL

## 2016-09-20 MED ORDER — SODIUM CHLORIDE 0.9 % IR SOLN
Status: DC | PRN
  Administered 2016-09-20: 08:00:00

## 2016-09-20 MED ORDER — LACTATED RINGERS IV SOLN
INTRAVENOUS | Status: DC | PRN
  Administered 2016-09-20: 07:00:00 via INTRAVENOUS

## 2016-09-20 MED ORDER — BACITRACIN-NEOMYCIN-POLYMYXIN OINTMENT TUBE
TOPICAL_OINTMENT | CUTANEOUS | Status: DC | PRN
  Administered 2016-09-20: 1 via TOPICAL

## 2016-09-20 MED ORDER — DOUBLE ANTIBIOTIC 500-10000 UNIT/GM EX OINT
TOPICAL_OINTMENT | CUTANEOUS | Status: AC
  Filled 2016-09-20: qty 1

## 2016-09-20 MED ORDER — FENTANYL CITRATE (PF) 250 MCG/5ML IJ SOLN
INTRAMUSCULAR | Status: AC
  Filled 2016-09-20: qty 5

## 2016-09-20 MED ORDER — MIDAZOLAM HCL 2 MG/2ML IJ SOLN
INTRAMUSCULAR | Status: AC
  Filled 2016-09-20: qty 2

## 2016-09-20 SURGICAL SUPPLY — 69 items
ADH SKN CLS APL DERMABOND .7 (GAUZE/BANDAGES/DRESSINGS) ×1
APL SKNCLS STERI-STRIP NONHPOA (GAUZE/BANDAGES/DRESSINGS)
BAG DECANTER FOR FLEXI CONT (MISCELLANEOUS) ×2 IMPLANT
BENZOIN TINCTURE PRP APPL 2/3 (GAUZE/BANDAGES/DRESSINGS) IMPLANT
BINDER ABDOMINAL 12 ML 46-62 (SOFTGOODS) ×2 IMPLANT
BLADE CLIPPER SURG (BLADE) IMPLANT
CABLE OR STIMULATOR 2X8 61 (WIRE) ×2 IMPLANT
CARTRIDGE OIL MAESTRO DRILL (MISCELLANEOUS) ×1 IMPLANT
CHLORAPREP W/TINT 26ML (MISCELLANEOUS) ×2 IMPLANT
CLIP TI WIDE RED SMALL 6 (CLIP) ×1 IMPLANT
CLIP VESOCCLUDE SM WIDE 6/CT (CLIP) IMPLANT
DERMABOND ADVANCED (GAUZE/BANDAGES/DRESSINGS) ×1
DERMABOND ADVANCED .7 DNX12 (GAUZE/BANDAGES/DRESSINGS) ×1 IMPLANT
DIFFUSER DRILL AIR PNEUMATIC (MISCELLANEOUS) ×2 IMPLANT
DRAPE C-ARM 42X72 X-RAY (DRAPES) ×2 IMPLANT
DRAPE C-ARMOR (DRAPES) ×2 IMPLANT
DRAPE LAPAROTOMY 100X72X124 (DRAPES) ×2 IMPLANT
DRAPE POUCH INSTRU U-SHP 10X18 (DRAPES) ×2 IMPLANT
DRAPE SURG 17X23 STRL (DRAPES) ×2 IMPLANT
DRSG OPSITE POSTOP 3X4 (GAUZE/BANDAGES/DRESSINGS) ×1 IMPLANT
ELECT REM PT RETURN 9FT ADLT (ELECTROSURGICAL) ×2
ELECTRODE REM PT RTRN 9FT ADLT (ELECTROSURGICAL) ×1 IMPLANT
GAUZE SPONGE 4X4 16PLY XRAY LF (GAUZE/BANDAGES/DRESSINGS) ×2 IMPLANT
GENERATOR NEUROSTIMULATOR (Neurostimulator) ×1 IMPLANT
GLOVE BIOGEL PI IND STRL 7.5 (GLOVE) ×1 IMPLANT
GLOVE BIOGEL PI INDICATOR 7.5 (GLOVE) ×1
GLOVE ECLIPSE 7.5 STRL STRAW (GLOVE) ×2 IMPLANT
GLOVE EXAM NITRILE LRG STRL (GLOVE) IMPLANT
GLOVE EXAM NITRILE XL STR (GLOVE) IMPLANT
GLOVE EXAM NITRILE XS STR PU (GLOVE) IMPLANT
GOWN STRL REUS W/ TWL LRG LVL3 (GOWN DISPOSABLE) IMPLANT
GOWN STRL REUS W/ TWL XL LVL3 (GOWN DISPOSABLE) IMPLANT
GOWN STRL REUS W/TWL 2XL LVL3 (GOWN DISPOSABLE) IMPLANT
GOWN STRL REUS W/TWL LRG LVL3 (GOWN DISPOSABLE)
GOWN STRL REUS W/TWL XL LVL3 (GOWN DISPOSABLE)
KIT BASIN OR (CUSTOM PROCEDURE TRAY) ×2 IMPLANT
KIT CHARGING (KITS) ×1
KIT CHARGING PRECISION NEURO (KITS) IMPLANT
KIT REMOTE CONTROL 112802 FREE (KITS) ×1 IMPLANT
KIT ROOM TURNOVER OR (KITS) ×2 IMPLANT
LEAD INFINION CX PERC 70CM (Cable) ×2 IMPLANT
NDL 18GX1X1/2 (RX/OR ONLY) (NEEDLE) IMPLANT
NDL ENTRADA 4.5IN (NEEDLE) IMPLANT
NDL HYPO 25X1 1.5 SAFETY (NEEDLE) ×1 IMPLANT
NEEDLE 18GX1X1/2 (RX/OR ONLY) (NEEDLE) IMPLANT
NEEDLE ENTRADA 4.5IN (NEEDLE) ×4 IMPLANT
NEEDLE HYPO 25X1 1.5 SAFETY (NEEDLE) ×2 IMPLANT
NS IRRIG 1000ML POUR BTL (IV SOLUTION) ×2 IMPLANT
OIL CARTRIDGE MAESTRO DRILL (MISCELLANEOUS) ×2
PACK LAMINECTOMY NEURO (CUSTOM PROCEDURE TRAY) ×2 IMPLANT
PAD ARMBOARD 7.5X6 YLW CONV (MISCELLANEOUS) ×2 IMPLANT
SPONGE LAP 4X18 X RAY DECT (DISPOSABLE) ×2 IMPLANT
SPONGE SURGIFOAM ABS GEL SZ50 (HEMOSTASIS) IMPLANT
STAPLER SKIN PROX WIDE 3.9 (STAPLE) ×2 IMPLANT
STRIP CLOSURE SKIN 1/2X4 (GAUZE/BANDAGES/DRESSINGS) IMPLANT
SUT MNCRL AB 4-0 PS2 18 (SUTURE) IMPLANT
SUT SILK 0 (SUTURE) ×2
SUT SILK 0 MO-6 18XCR BRD 8 (SUTURE) ×1 IMPLANT
SUT SILK 0 TIES 10X30 (SUTURE) IMPLANT
SUT SILK 2 0 TIES 10X30 (SUTURE) IMPLANT
SUT VIC AB 2-0 CP2 18 (SUTURE) ×4 IMPLANT
SYR 10ML LL (SYRINGE) IMPLANT
SYR EPIDURAL 5ML GLASS (SYRINGE) ×2 IMPLANT
TOOL LONG TUNNEL (SPINAL CORD STIMULATOR) ×1 IMPLANT
TOWEL GREEN STERILE (TOWEL DISPOSABLE) ×2 IMPLANT
TOWEL GREEN STERILE FF (TOWEL DISPOSABLE) ×2 IMPLANT
TUNNELING TOOL ×1 IMPLANT
WATER STERILE IRR 1000ML POUR (IV SOLUTION) ×2 IMPLANT
YANKAUER SUCT BULB TIP NO VENT (SUCTIONS) ×2 IMPLANT

## 2016-09-20 NOTE — Op Note (Signed)
PREOP DX: 1) lumbago  2) lumbar radiculopathy  3) chronic pain  POSTOP DX: 1) lumbago  2) lumbar radiculopathy  3) chronic pain  PROCEDURES PERFORMED:1) intraop fluoro 2) placement of 2 16 contact [employer] Infinion leads 3) placement of Spectra WaveWriter SCS generator  SURGEON:Gerlad Pelzel  ASSISTANT: NONE  ANESTHESIA: MAC  EBL: <20cc  DESCRIPTION OF PROCEDURE: After a discussion of risks, benefits and alternatives, informed consent was obtained. The patient was taken to the OR, turned prone onto a Jackson table, all pressure points padded, SCD's placed, and an adequate plane of anesthesia induced. A timeout was taken to verify the correct patient, position, personnel, availability of appropriate equipment, and administration of perioperative antibiotics.  The thoracic and lumbar areas were widely prepped with chloraprep and draped into a sterile field. Fluoroscopy was used to plan a left paramedian incision at the L1-L3 levels, and an incision made with a 10 blade and carried down to the dorsolumbar fascia with the bovie and blunt dissection. Retractors were placed and a 14g [employer] tuohy needle placed into the epidural space at the T12-L1 interspace using biplanar fluoro and loss-of-resistance technique. The needle was aspirated without any return of fluid. A [employer] INFINION lead was introduced and under live AP fluoro advanced until the distal-most contact overlay the cephaladaspect of the T7 vertebral body shadow with the rest of the contacts distributed over the T8 and T9 vertebral bodies in a position just right of anatomic midline. A second Infinion lead was placed just right of anatomic midline in the same levels using the same technique. The patient was awakened and the leads tested; impedances were good, and the patient reported good coverage with amplitudes in the 3-10 mA range. 0 silk sutures were placed in the fascia adjacent to the needles. The  needles and stylets were removed under fluoroscopy with no lead migration noted. Leads were then fixed to the fascia with the 0 silk sutures tied in a chest tube type fixation; repeat images were obtained to verify that there had been no lead migration. Small vascular clips were placed in the fascia as a radiolucent marker of the lead placement  The incision was inspected and hemostasis obtained with the bipolar cautery.  Attention was then turned to creation of a subcutaneous pocket. At the left flank, a 3 cm incision was made with a 10 blade and using the bovie and blunt dissection a pocket of size appropriate to place a SCS generator. The pocket was trialed, and found to be of adequate size. The pocket was inspected for hemostasis, which was found to be excellent. Using reverse seldinger technique, the leads were tunneled to the pocket site, and the leads inserted into the SCS generator. Impedances were checked, and all found to be excellent. The leads were then all fixed into position with a self-torquing wrench. The wiring was all carefully coiled, placed behind the generator and placed in the pocket.  Both incisions were copiously irrigated with bacitracin-containing irrigation. The lumbar incision was closed in 2 deep layers of interrupted 2-0 vicryl and the skin closed with staples. The pocket incision was closed with a deeper layer of 2-0 vicryl interrupted sutures, and the skin closed with staples.Sterile dressings were applied. Needle, sponge, and instrument counts were correct x2 at the end of the case.  The patient was then carefully awakened from anesthesia, turned supine, an abdominal binder placed, and the patient taken to the recovery room where she underwent complex spinal cord stimulator programming.  COMPLICATIONS: NONE  CONDITION: Stable throughout the course of the procedure and immediately afterward  DISPOSITION: discharge to home, with antibiotics and pain medicine. Discussed  care with the patient and his family. Followup in clinic will be scheduled in 10-14 days.

## 2016-09-20 NOTE — Progress Notes (Signed)
Orthopedic Tech Progress Note Patient Details:  Marc [name] [birth year] [id number] As ordered by Dr. Dustin Flock Devices Type of Ortho Device: Abdominal binder Ortho Device/Splint Location: abdomen Ortho Device/Splint Interventions: Loanne Drilling, Joshwa Hemric 09/20/2016, 11:18 AM

## 2016-09-20 NOTE — Anesthesia Procedure Notes (Signed)
Procedure Name: MAC Date/Time: 09/20/2016 7:36 AM Performed by: Carney Living Pre-anesthesia Checklist: Patient identified, Emergency Drugs available, Suction available, Patient being monitored and Timeout performed Patient Re-evaluated:Patient Re-evaluated prior to induction Oxygen Delivery Method: Nasal cannula

## 2016-09-20 NOTE — Anesthesia Postprocedure Evaluation (Signed)
Anesthesia Post Note  Patient: Marc [name]  Procedure(s) Performed: Procedure(s) (LRB): LUMBAR SPINAL CORD STIMULATOR INSERTION (N/A)     Patient location during evaluation: PACU Anesthesia Type: MAC Level of consciousness: awake and alert Pain management: pain level controlled Vital Signs Assessment: post-procedure vital signs reviewed and stable Respiratory status: spontaneous breathing, nonlabored ventilation and respiratory function stable Cardiovascular status: stable and blood pressure returned to baseline Anesthetic complications: no    Last Vitals:  Vitals:   09/20/16 1000 09/20/16 1015  BP:  120/67  Pulse:  (!) 53  Resp:    Temp: 36.8 C 36.7 C  SpO2:  93%    Last Pain:  Vitals:   09/20/16 0620  TempSrc: Oral                 Nickalas Mccarrick,W. EDMOND

## 2016-09-20 NOTE — Transfer of Care (Signed)
Immediate Anesthesia Transfer of Care Note  Patient: Marc [name]  Procedure(s) Performed: Procedure(s) with comments: [place] (N/A) - [place]  Patient Location: PACU  Anesthesia Type:MAC  Level of Consciousness: awake, alert , oriented and patient cooperative  Airway & Oxygen Therapy: Patient Spontanous Breathing and Patient connected to nasal cannula oxygen  Post-op Assessment: Report given to RN, Post -op Vital signs reviewed and stable and Patient moving all extremities X 4  Post vital signs: Reviewed and stable  Last Vitals:  Vitals:   09/20/16 0620 09/20/16 0915  BP: (!) 155/78 124/63  Pulse: 68 62  Resp: 19 13  Temp: 36.8 C   SpO2: 100% 98%    Last Pain:  Vitals:   09/20/16 0620  TempSrc: Oral      Patients Stated Pain Goal: 5 (32/20/25 4270)  Complications: No apparent anesthesia complications

## 2016-09-20 NOTE — Discharge Instructions (Addendum)
Dr. Maryjean Ka Post-Op Orders   Ice Pack - 20 minutes on (in a pillow case), and 20 minutes off. Wear the ice pack UNDER the binder.  Follow up in office, they will call you for an appointment in 10 days to 2 weeks.  Increase activity gradually.    No lifting anything heavier than a gallon of milk (10 pounds) until seen in the office.  Advance diet slowly as tolerated.  Dressing care:  Keep dressing dry for 3 days, and on Post-op day 4, may shower.  Call for fever, drainage, and redness.  No swimming or bathing in a bathtub (do not get into standing water).   DO NOT RESTART PLAVIX (CLOPIDIGREL) UNTIL Monday 09/23/2016

## 2016-09-23 ENCOUNTER — Encounter (HOSPITAL_COMMUNITY): Payer: Self-pay | Admitting: Anesthesiology

## 2016-12-08 IMAGING — DX DG FOOT COMPLETE 3+V*R*
3 series · 3 of 3 positions shown · non-contrast
Comparison: Radiograph dated 03/01/2015

CLINICAL DATA: 74-year-old male with toe amputation in [REDACTED].
Patient presenting with fever and right lower extremity swelling.

EXAM:
RIGHT FOOT COMPLETE - 3+ VIEW

[foot ap]
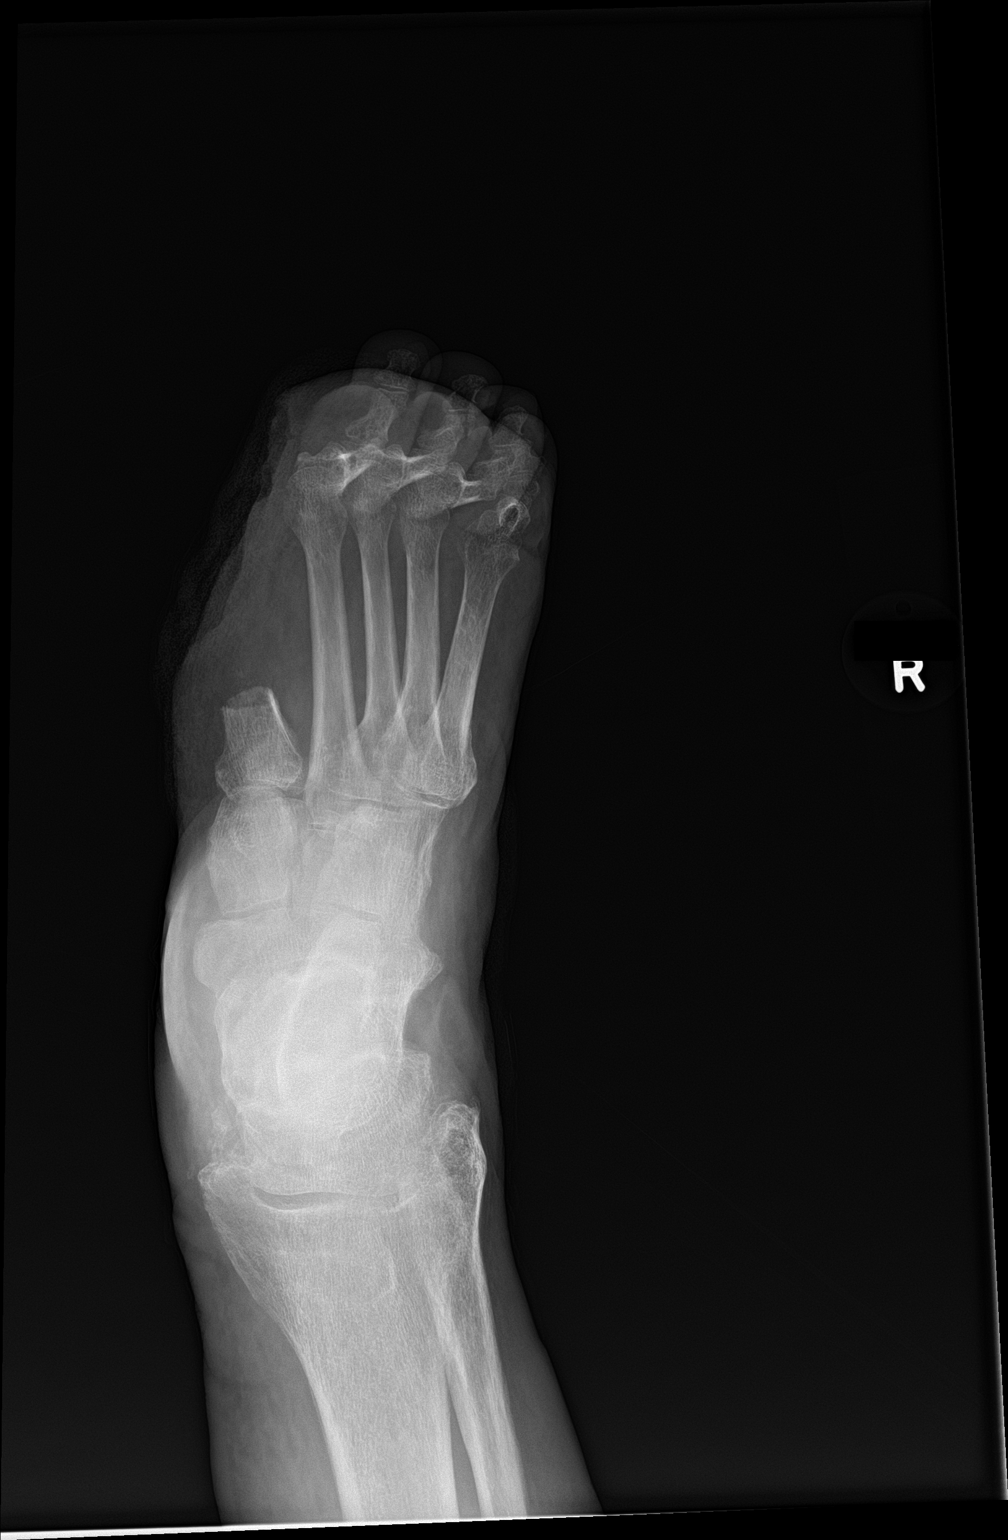

[foot obl]
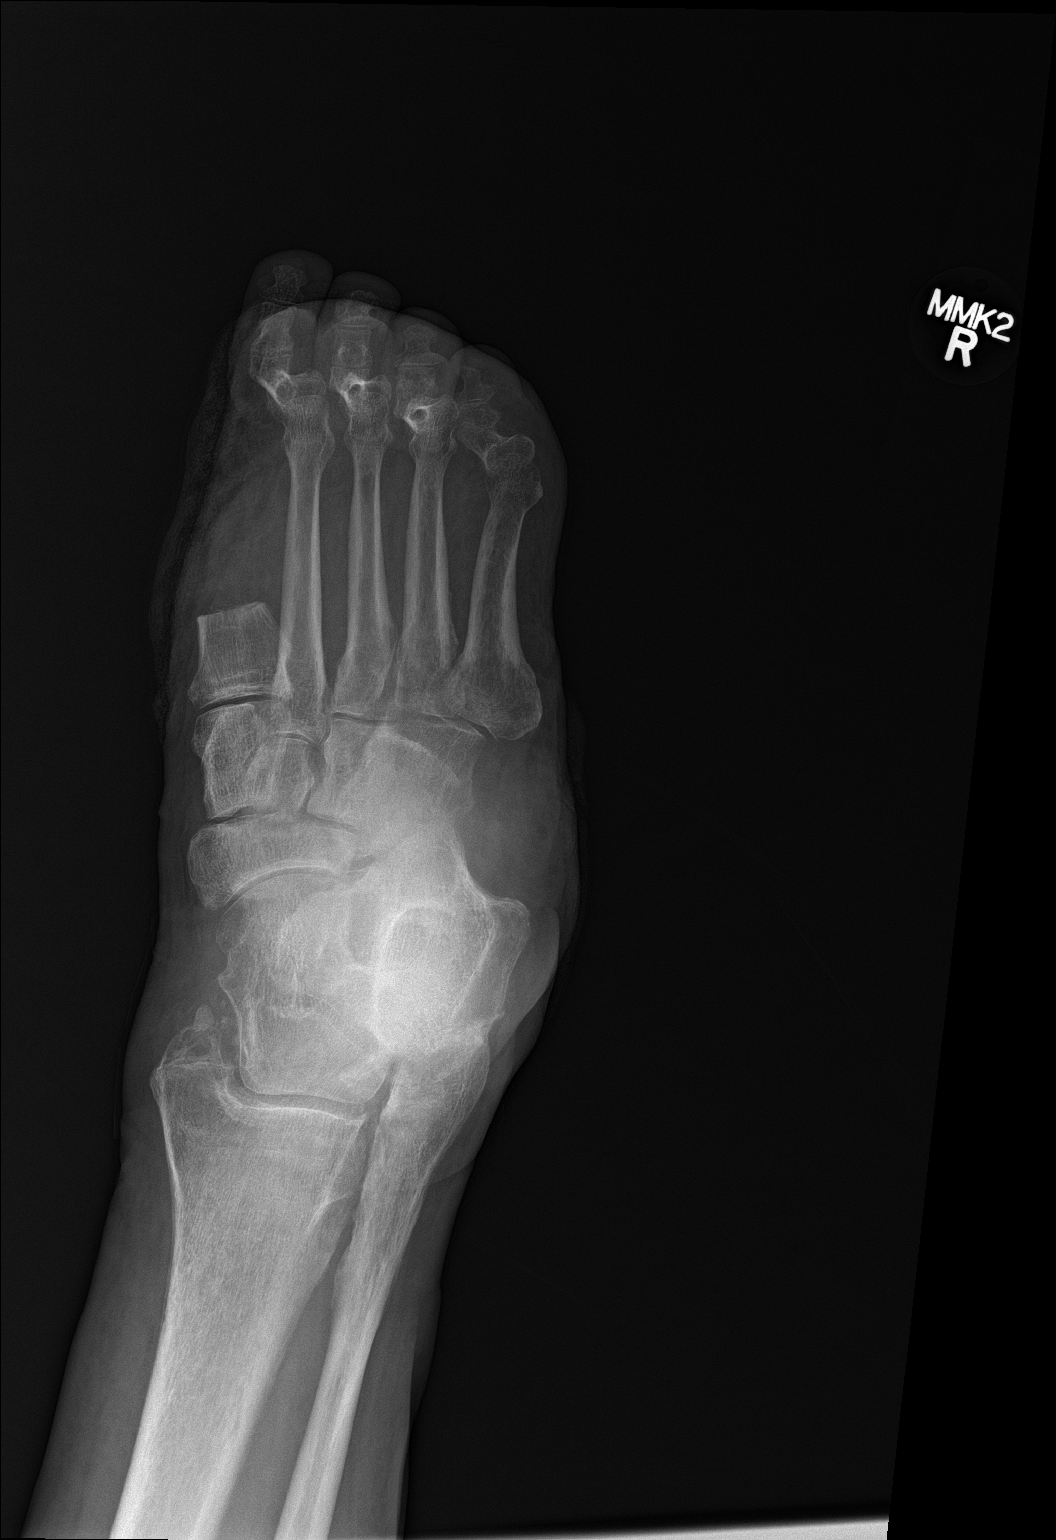

[foot lat]
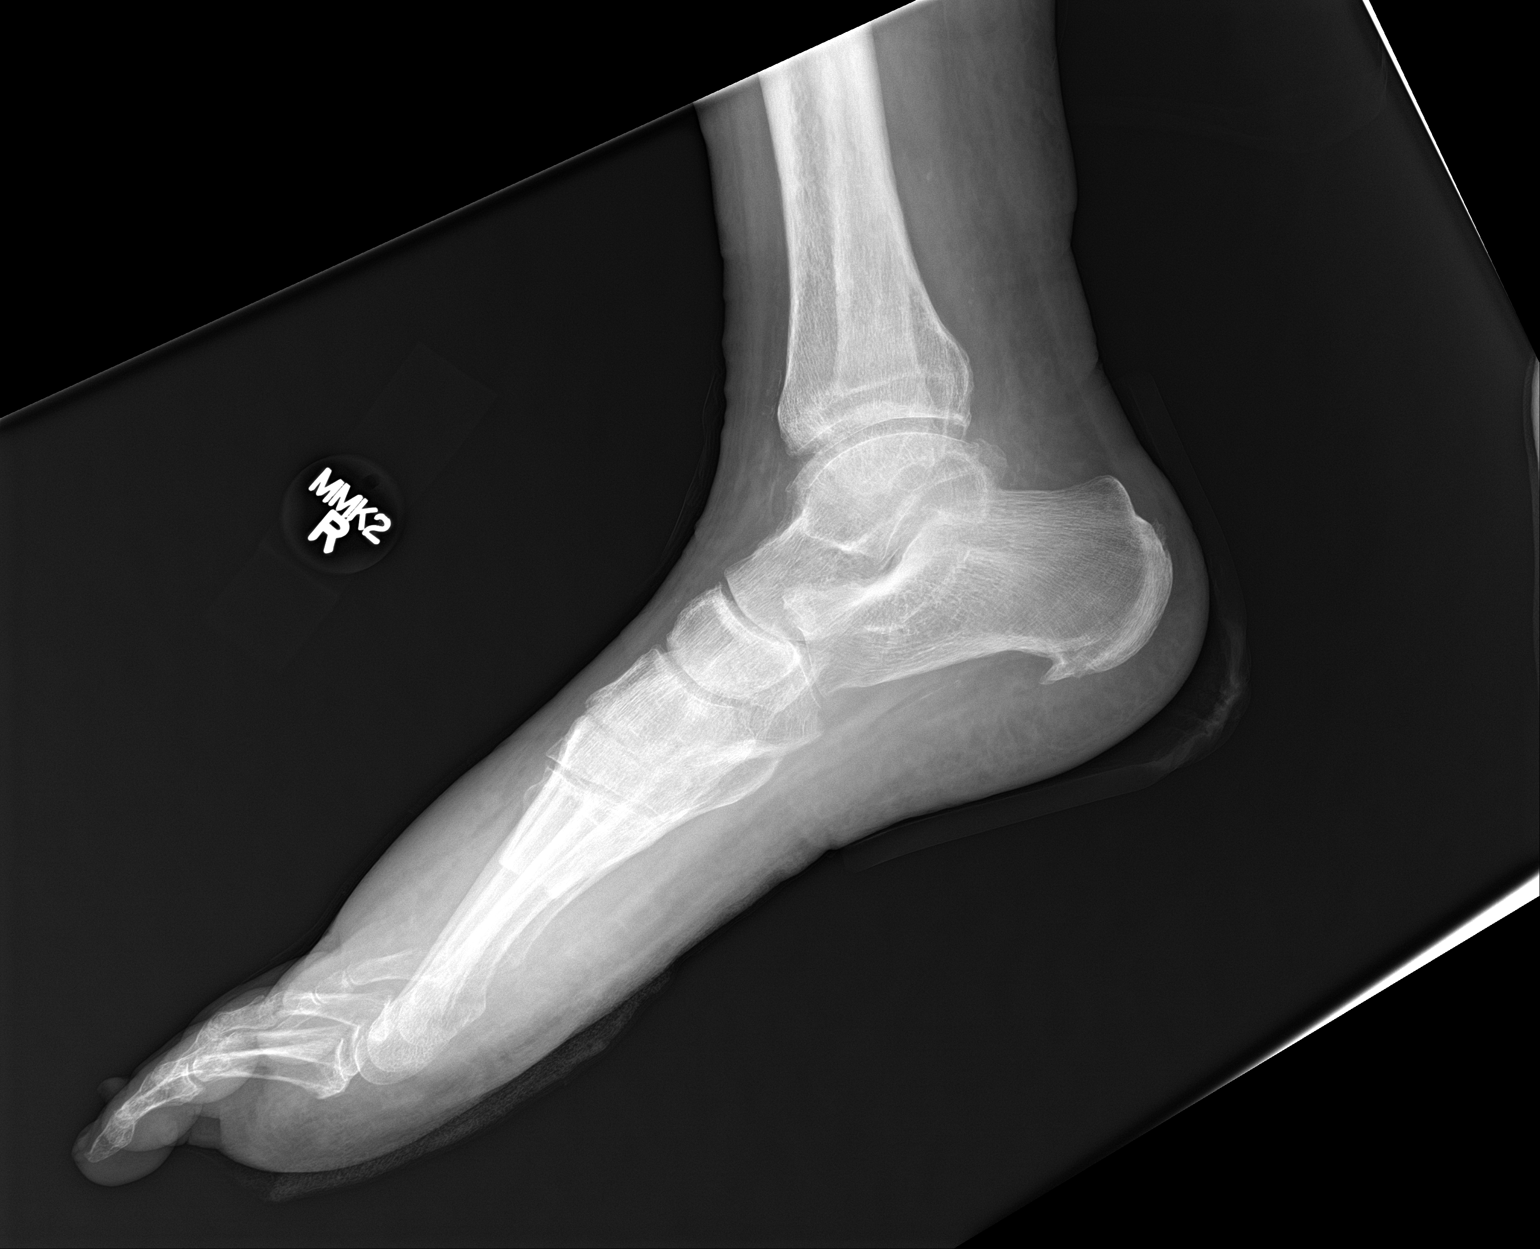

[3 of 3 positions shown; findings below may reference images not displayed]

FINDINGS: There is stable appearing proximal transmetatarsal amputation of the
first digit. There is no acute fracture or dislocation. No erosive
changes or evidence of periosteal reaction. The bones are
osteopenic. There are degenerative changes of the hindfoot and ankle
joint. There is diffuse soft tissue swelling of the midfoot and
forefoot. No soft tissue gas or radiopaque foreign object
identified. Dressing noted over the foot.
IMPRESSION: Stable appearing amputation of the proximal aspect of the first
metatarsal. No acute osseous pathology.

Diffuse soft tissue swelling of the foot.

## 2016-12-21 ENCOUNTER — Other Ambulatory Visit: Payer: Self-pay | Admitting: Internal Medicine

## 2016-12-21 DIAGNOSIS — Z96659 Presence of unspecified artificial knee joint: Principal | ICD-10-CM

## 2016-12-21 DIAGNOSIS — T8459XD Infection and inflammatory reaction due to other internal joint prosthesis, subsequent encounter: Secondary | ICD-10-CM

## 2017-02-12 ENCOUNTER — Ambulatory Visit: Payer: Medicare Other | Admitting: Vascular Surgery

## 2017-02-12 ENCOUNTER — Ambulatory Visit (INDEPENDENT_AMBULATORY_CARE_PROVIDER_SITE_OTHER)
Admission: RE | Admit: 2017-02-12 | Discharge: 2017-02-12 | Disposition: A | Discharge status: Home / Self Care | Payer: Medicare Other | Source: Ambulatory Visit | Attending: Vascular Surgery | Admitting: Vascular Surgery

## 2017-02-12 ENCOUNTER — Encounter: Payer: Self-pay | Admitting: Vascular Surgery

## 2017-02-12 ENCOUNTER — Ambulatory Visit (HOSPITAL_COMMUNITY)
Admission: RE | Admit: 2017-02-12 | Discharge: 2017-02-12 | Disposition: A | Discharge status: Home / Self Care | Payer: Medicare Other | Source: Ambulatory Visit | Attending: Vascular Surgery | Admitting: Vascular Surgery

## 2017-02-12 VITALS — BP 63/46 | HR 85 | Temp 96.7°F | Resp 16 | Ht 73.0 in | Wt 174.0 lb

## 2017-02-12 DIAGNOSIS — I739 Peripheral vascular disease, unspecified: Secondary | ICD-10-CM

## 2017-02-12 NOTE — Progress Notes (Signed)
Patient name: Marc [name] MRN: [id number] DOB: 1940/09/13 Sex: male  REASON FOR VISIT:   Follow-up of right femoral tibial bypass  HPI:   Marc [name] is a pleasant 77 y.o. male who underwent a right common femoral artery to tibial peroneal trunk bypass with a vein graft in January 2017.  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  He comes in for a one-year follow-up visit.  He has no complaints referrable to his right leg.  He denies any claudication or rest pain in either lower extremity.   However he has been dizzy for the last several days.  In the office today he became nauseous and vomited.  He denies any abdominal pain.  Past Medical History:  Diagnosis Date  . Anemia    low iron  . ARF (acute renal failure) ([place]) 12/2014  . Arthritis   . Bradycardia   . Chronic bronchitis ([place])   . CKD (chronic kidney disease), stage III ([place])   . Colon polyp   . Depression    occassional.  Situational  . GERD (gastroesophageal reflux disease)   . Gout   . Gout   . History of blood transfusion   . History of GI bleed 2007  . Hypercholesteremia   . Hypertension   . Neuropathy   . PAD (peripheral artery disease) ([place])   . Pneumonia    denies  . Tobacco abuse disorder 03/10/2012  . Type II diabetes mellitus ([place])   . Ulcer disease     Family History  Problem Relation Age of Onset  . Colon cancer Paternal Uncle        Uncle  . Heart disease Father   . Hypertension Father   . Heart attack Father   . Heart disease Mother   . Diabetes Mother   . Hypertension Mother     SOCIAL HISTORY: Social History   Tobacco Use  . Smoking status: Current Every Day Smoker    Packs/day: 1.00    Years: 58.00    Pack years: 58.00    Types: Cigarettes  . Smokeless tobacco: Former Systems developer    Types: Chew  . Tobacco comment: 1-2 pks per day.   Substance Use Topics  . Alcohol use: No    Alcohol/week: 0.0 oz    Allergies  Allergen Reactions  . Actos  [Pioglitazone] Swelling and Other (See Comments)    LOWER EXTREMITY EDEMA     Current Outpatient Medications  Medication Sig Dispense Refill  . allopurinol (ZYLOPRIM) 300 MG tablet Take 300 mg by mouth at bedtime.     [name] aspirin EC 325 MG tablet Take 1 tablet (325 mg total) by mouth 2 (two) times daily. 30 tablet 0  . atorvastatin (LIPITOR) 10 MG tablet Take 10 mg by mouth daily.    . cephALEXin (KEFLEX) 500 MG capsule Take 1 capsule (500 mg total) by mouth 3 (three) times daily. 12 capsule 0  . clopidogrel (PLAVIX) 75 MG tablet Take 75 mg by mouth daily.     . colchicine 0.6 MG tablet Take 0.6 mg by mouth daily.    [name] docusate sodium (COLACE) 100 MG capsule Take 100 mg by mouth 2 (two) times daily.    [name] doxycycline (MONODOX) 100 MG capsule TAKE 1 CAPSULE BY MOUTH  TWICE DAILY 180 capsule 3  . gabapentin (NEURONTIN) 600 MG tablet Take 1,200 mg by mouth every 12 (twelve) hours.     [name] glimepiride (AMARYL) 4 MG tablet  Take 8 mg by mouth daily with breakfast.     . lisinopril (PRINIVIL,ZESTRIL) 20 MG tablet Take 20 mg by mouth daily.    [name] oxycodone (ROXICODONE) 30 MG immediate release tablet Take 1 tablet (30 mg total) by mouth every 6 (six) hours as needed for pain (postsurgical pain). 30 tablet 0  . pantoprazole (PROTONIX) 40 MG tablet Take 40 mg by mouth daily.    . Prenatal Vit-Fe Fumarate-FA (MULTIVITAMIN-PRENATAL) 27-0.8 MG TABS tablet Take 1 tablet by mouth daily at 12 noon.    . furosemide (LASIX) 80 MG tablet Take 1 tablet (80 mg total) by mouth daily. 5 tablet 0   No current facility-administered medications for this visit.     REVIEW OF SYSTEMS:  [X]  denotes positive finding, [ ]  denotes negative finding Cardiac  Comments:  Chest pain or chest pressure:    Shortness of breath upon exertion:    Short of breath when lying flat:    Irregular heart rhythm:        Vascular    Pain in calf, thigh, or hip brought on by ambulation:    Pain in feet at night that wakes you up from your  sleep:     Blood clot in your veins:    Leg swelling:         Pulmonary    Oxygen at home:    Productive cough:     Wheezing:         Neurologic    Sudden weakness in arms or legs:     Sudden numbness in arms or legs:     Sudden onset of difficulty speaking or slurred speech:    Temporary loss of vision in one eye:     Problems with dizziness:  X       Gastrointestinal    Blood in stool:     Vomited blood:         Genitourinary    Burning when urinating:     Blood in urine:        Psychiatric    Major depression:         Hematologic    Bleeding problems:    Problems with blood clotting too easily:        Skin    Rashes or ulcers:        Constitutional    Fever or chills:     PHYSICAL EXAM:   Vitals:   02/12/17 1251  BP: (!) 63/46  Pulse: 85  Resp: 16  Temp: (!) 96.7 F (35.9 C)  TempSrc: Oral  SpO2: 94%  Weight: 174 lb (78.9 kg)  Height: 6\' 1"  (1.854 m)    GENERAL: The patient is a well-nourished male, in no acute distress. The vital signs are documented above. CARDIAC: There is a regular rate and rhythm.  VASCULAR: I do not detect carotid bruits. He has palpable femoral pulses. Both feet are warm and well-perfused. He has no significant lower extremity swelling. PULMONARY: There is good air exchange bilaterally without wheezing or rales. ABDOMEN: Soft and non-tender with normal pitched bowel sounds.  MUSCULOSKELETAL: His toe amputation sites are healed.[name] NEUROLOGIC: No focal weakness or paresthesias are detected. SKIN: There are no ulcers or rashes noted. PSYCHIATRIC: The patient has a normal affect.  DATA:    DUPLEX RIGHT FEMOROPOPLITEAL BYPASS GRAFT: I have interpreted his duplex of his right femoral tibial bypass graft.  He has biphasic waveforms throughout the graft with no areas of stenosis noted.  ARTERIAL  DOPPLER STUDY: I have independently interpreted his arterial Doppler study today.  He has biphasic Doppler signals in the right posterior  tibial position and dorsalis pedis position.  ABI on the right is 95%.  On the left side he has monophasic Doppler signals in the dorsalis pedis and posterior tibial positions.  ABIs 64%.  MEDICAL ISSUES:   STATUS POST RIGHT FEMORAL TO TIBIAL BYPASS: His bypass graft is working well.  There are no areas of stenosis within the graft and his ABIs 95%.  We have again discussed the importance of tobacco cessation.  I encouraged him to stay as active as possible.  We will check his graft again in 1 year.  DIZZINESS: He has had some dizziness which is likely related to his low blood pressure today.  He denies any recent changes in his medications.  He denies any injury.  We did recheck his blood pressure in the office and it did improve.  I asked him to consider letting us call an ambulance to take him to the hospital however he refuses this.  We also offered to help get him a ride but again he refuses this.   Deitra Mayo [facility] [phone]

## 2017-03-07 ENCOUNTER — Encounter (HOSPITAL_COMMUNITY): Payer: Self-pay

## 2017-03-07 ENCOUNTER — Other Ambulatory Visit: Payer: Self-pay

## 2017-03-07 DIAGNOSIS — N179 Acute kidney failure, unspecified: Secondary | ICD-10-CM | POA: Diagnosis not present

## 2017-03-07 DIAGNOSIS — Z981 Arthrodesis status: Secondary | ICD-10-CM

## 2017-03-07 DIAGNOSIS — I5032 Chronic diastolic (congestive) heart failure: Secondary | ICD-10-CM | POA: Diagnosis present

## 2017-03-07 DIAGNOSIS — R944 Abnormal results of kidney function studies: Secondary | ICD-10-CM | POA: Diagnosis not present

## 2017-03-07 DIAGNOSIS — N183 Chronic kidney disease, stage 3 (moderate): Secondary | ICD-10-CM | POA: Diagnosis present

## 2017-03-07 DIAGNOSIS — Z7952 Long term (current) use of systemic steroids: Secondary | ICD-10-CM

## 2017-03-07 DIAGNOSIS — Z9842 Cataract extraction status, left eye: Secondary | ICD-10-CM

## 2017-03-07 DIAGNOSIS — Z9081 Acquired absence of spleen: Secondary | ICD-10-CM

## 2017-03-07 DIAGNOSIS — I2510 Atherosclerotic heart disease of native coronary artery without angina pectoris: Secondary | ICD-10-CM | POA: Diagnosis present

## 2017-03-07 DIAGNOSIS — E1122 Type 2 diabetes mellitus with diabetic chronic kidney disease: Secondary | ICD-10-CM | POA: Diagnosis present

## 2017-03-07 DIAGNOSIS — Z9841 Cataract extraction status, right eye: Secondary | ICD-10-CM

## 2017-03-07 DIAGNOSIS — D631 Anemia in chronic kidney disease: Secondary | ICD-10-CM | POA: Diagnosis present

## 2017-03-07 DIAGNOSIS — Z7902 Long term (current) use of antithrombotics/antiplatelets: Secondary | ICD-10-CM

## 2017-03-07 DIAGNOSIS — E1142 Type 2 diabetes mellitus with diabetic polyneuropathy: Secondary | ICD-10-CM | POA: Diagnosis present

## 2017-03-07 DIAGNOSIS — Z79899 Other long term (current) drug therapy: Secondary | ICD-10-CM

## 2017-03-07 DIAGNOSIS — Z833 Family history of diabetes mellitus: Secondary | ICD-10-CM

## 2017-03-07 DIAGNOSIS — Z89421 Acquired absence of other right toe(s): Secondary | ICD-10-CM

## 2017-03-07 DIAGNOSIS — E872 Acidosis: Secondary | ICD-10-CM | POA: Diagnosis present

## 2017-03-07 DIAGNOSIS — E1151 Type 2 diabetes mellitus with diabetic peripheral angiopathy without gangrene: Secondary | ICD-10-CM | POA: Diagnosis present

## 2017-03-07 DIAGNOSIS — Z961 Presence of intraocular lens: Secondary | ICD-10-CM | POA: Diagnosis present

## 2017-03-07 DIAGNOSIS — M109 Gout, unspecified: Secondary | ICD-10-CM | POA: Diagnosis present

## 2017-03-07 DIAGNOSIS — Z8249 Family history of ischemic heart disease and other diseases of the circulatory system: Secondary | ICD-10-CM

## 2017-03-07 DIAGNOSIS — E7800 Pure hypercholesterolemia, unspecified: Secondary | ICD-10-CM | POA: Diagnosis present

## 2017-03-07 DIAGNOSIS — K219 Gastro-esophageal reflux disease without esophagitis: Secondary | ICD-10-CM | POA: Diagnosis present

## 2017-03-07 DIAGNOSIS — Z7984 Long term (current) use of oral hypoglycemic drugs: Secondary | ICD-10-CM

## 2017-03-07 DIAGNOSIS — I130 Hypertensive heart and chronic kidney disease with heart failure and stage 1 through stage 4 chronic kidney disease, or unspecified chronic kidney disease: Secondary | ICD-10-CM | POA: Diagnosis present

## 2017-03-07 DIAGNOSIS — F17210 Nicotine dependence, cigarettes, uncomplicated: Secondary | ICD-10-CM | POA: Diagnosis present

## 2017-03-07 DIAGNOSIS — G894 Chronic pain syndrome: Secondary | ICD-10-CM | POA: Diagnosis present

## 2017-03-07 DIAGNOSIS — Z66 Do not resuscitate: Secondary | ICD-10-CM | POA: Diagnosis present

## 2017-03-07 DIAGNOSIS — Z888 Allergy status to other drugs, medicaments and biological substances status: Secondary | ICD-10-CM

## 2017-03-07 DIAGNOSIS — Z96651 Presence of right artificial knee joint: Secondary | ICD-10-CM | POA: Diagnosis present

## 2017-03-07 DIAGNOSIS — Z86010 Personal history of colonic polyps: Secondary | ICD-10-CM

## 2017-03-07 DIAGNOSIS — F329 Major depressive disorder, single episode, unspecified: Secondary | ICD-10-CM | POA: Diagnosis present

## 2017-03-07 DIAGNOSIS — E11649 Type 2 diabetes mellitus with hypoglycemia without coma: Secondary | ICD-10-CM | POA: Diagnosis present

## 2017-03-07 LAB — CBC
HEMATOCRIT: 29.1 % — AB (ref 39.0–52.0)
HEMOGLOBIN: 9.5 g/dL — AB (ref 13.0–17.0)
MCH: 31.9 pg (ref 26.0–34.0)
MCHC: 32.6 g/dL (ref 30.0–36.0)
MCV: 97.7 fL (ref 78.0–100.0)
Platelets: 183 10*3/uL (ref 150–400)
RBC: 2.98 MIL/uL — AB (ref 4.22–5.81)
RDW: 14.6 % (ref 11.5–15.5)
WBC: 7.9 10*3/uL (ref 4.0–10.5)

## 2017-03-07 LAB — BASIC METABOLIC PANEL
ANION GAP: 13 (ref 5–15)
BUN: 93 mg/dL — ABNORMAL HIGH (ref 6–20)
CALCIUM: 9.4 mg/dL (ref 8.9–10.3)
CHLORIDE: 106 mmol/L (ref 101–111)
CO2: 19 mmol/L — AB (ref 22–32)
Creatinine, Ser: 4.25 mg/dL — ABNORMAL HIGH (ref 0.61–1.24)
GFR calc non Af Amer: 12 mL/min — ABNORMAL LOW (ref >60)
GFR, EST AFRICAN AMERICAN: 14 mL/min — AB (ref >60)
GLUCOSE: 244 mg/dL — AB (ref 65–99)
Potassium: 4.8 mmol/L (ref 3.5–5.1)
Sodium: 138 mmol/L (ref 135–145)

## 2017-03-07 LAB — CBG MONITORING, ED: GLUCOSE-CAPILLARY: 248 mg/dL — AB (ref 65–99)

## 2017-03-07 NOTE — ED Triage Notes (Addendum)
Pt was called by his PCP to come to the ER for further evaluation of poor kidney fxn shown on lab work. Pt reports back pain. Denies cp/SOB. Alert and oriented, no distress  Noted. No labwork on file in our system.

## 2017-03-07 NOTE — ED Notes (Signed)
Family updated on wait, patient remains in waiting room at this time

## 2017-03-08 ENCOUNTER — Encounter (HOSPITAL_COMMUNITY): Payer: Self-pay | Admitting: Family Medicine

## 2017-03-08 ENCOUNTER — Inpatient Hospital Stay (HOSPITAL_COMMUNITY)
Admission: EM | Admit: 2017-03-08 | Discharge: 2017-03-11 | DRG: 683 | Disposition: A | Discharge status: Home / Self Care | Payer: Medicare Other | Attending: Internal Medicine | Admitting: Internal Medicine

## 2017-03-08 ENCOUNTER — Inpatient Hospital Stay (HOSPITAL_COMMUNITY): Payer: Medicare Other

## 2017-03-08 ENCOUNTER — Other Ambulatory Visit: Payer: Self-pay

## 2017-03-08 DIAGNOSIS — Z981 Arthrodesis status: Secondary | ICD-10-CM | POA: Diagnosis not present

## 2017-03-08 DIAGNOSIS — I5032 Chronic diastolic (congestive) heart failure: Secondary | ICD-10-CM | POA: Diagnosis present

## 2017-03-08 DIAGNOSIS — E7800 Pure hypercholesterolemia, unspecified: Secondary | ICD-10-CM | POA: Diagnosis present

## 2017-03-08 DIAGNOSIS — Z961 Presence of intraocular lens: Secondary | ICD-10-CM | POA: Diagnosis present

## 2017-03-08 DIAGNOSIS — E1122 Type 2 diabetes mellitus with diabetic chronic kidney disease: Secondary | ICD-10-CM | POA: Diagnosis present

## 2017-03-08 DIAGNOSIS — R944 Abnormal results of kidney function studies: Secondary | ICD-10-CM | POA: Diagnosis present

## 2017-03-08 DIAGNOSIS — G894 Chronic pain syndrome: Secondary | ICD-10-CM | POA: Diagnosis present

## 2017-03-08 DIAGNOSIS — N183 Chronic kidney disease, stage 3 unspecified: Secondary | ICD-10-CM | POA: Diagnosis present

## 2017-03-08 DIAGNOSIS — E11649 Type 2 diabetes mellitus with hypoglycemia without coma: Secondary | ICD-10-CM | POA: Diagnosis present

## 2017-03-08 DIAGNOSIS — I130 Hypertensive heart and chronic kidney disease with heart failure and stage 1 through stage 4 chronic kidney disease, or unspecified chronic kidney disease: Secondary | ICD-10-CM | POA: Diagnosis present

## 2017-03-08 DIAGNOSIS — Z66 Do not resuscitate: Secondary | ICD-10-CM | POA: Diagnosis present

## 2017-03-08 DIAGNOSIS — N170 Acute kidney failure with tubular necrosis: Secondary | ICD-10-CM | POA: Diagnosis not present

## 2017-03-08 DIAGNOSIS — E1142 Type 2 diabetes mellitus with diabetic polyneuropathy: Secondary | ICD-10-CM | POA: Diagnosis present

## 2017-03-08 DIAGNOSIS — Z9841 Cataract extraction status, right eye: Secondary | ICD-10-CM | POA: Diagnosis not present

## 2017-03-08 DIAGNOSIS — I2510 Atherosclerotic heart disease of native coronary artery without angina pectoris: Secondary | ICD-10-CM | POA: Diagnosis present

## 2017-03-08 DIAGNOSIS — G8929 Other chronic pain: Secondary | ICD-10-CM | POA: Diagnosis present

## 2017-03-08 DIAGNOSIS — D631 Anemia in chronic kidney disease: Secondary | ICD-10-CM | POA: Diagnosis present

## 2017-03-08 DIAGNOSIS — E1151 Type 2 diabetes mellitus with diabetic peripheral angiopathy without gangrene: Secondary | ICD-10-CM | POA: Diagnosis present

## 2017-03-08 DIAGNOSIS — Z89421 Acquired absence of other right toe(s): Secondary | ICD-10-CM | POA: Diagnosis not present

## 2017-03-08 DIAGNOSIS — M109 Gout, unspecified: Secondary | ICD-10-CM | POA: Diagnosis present

## 2017-03-08 DIAGNOSIS — Z9842 Cataract extraction status, left eye: Secondary | ICD-10-CM | POA: Diagnosis not present

## 2017-03-08 DIAGNOSIS — Z96651 Presence of right artificial knee joint: Secondary | ICD-10-CM | POA: Diagnosis present

## 2017-03-08 DIAGNOSIS — F329 Major depressive disorder, single episode, unspecified: Secondary | ICD-10-CM | POA: Diagnosis present

## 2017-03-08 DIAGNOSIS — K219 Gastro-esophageal reflux disease without esophagitis: Secondary | ICD-10-CM | POA: Diagnosis present

## 2017-03-08 DIAGNOSIS — N179 Acute kidney failure, unspecified: Secondary | ICD-10-CM

## 2017-03-08 DIAGNOSIS — F17210 Nicotine dependence, cigarettes, uncomplicated: Secondary | ICD-10-CM | POA: Diagnosis present

## 2017-03-08 DIAGNOSIS — E872 Acidosis: Secondary | ICD-10-CM | POA: Diagnosis present

## 2017-03-08 LAB — URINALYSIS, ROUTINE W REFLEX MICROSCOPIC
Bilirubin Urine: NEGATIVE
Bilirubin Urine: NEGATIVE
Glucose, UA: 50 mg/dL — AB
Glucose, UA: NEGATIVE mg/dL
Hgb urine dipstick: NEGATIVE
Hgb urine dipstick: NEGATIVE
Ketones, ur: NEGATIVE mg/dL
Ketones, ur: NEGATIVE mg/dL
LEUKOCYTES UA: NEGATIVE
Leukocytes, UA: NEGATIVE
Nitrite: NEGATIVE
Nitrite: NEGATIVE
PROTEIN: NEGATIVE mg/dL
Protein, ur: NEGATIVE mg/dL
Specific Gravity, Urine: 1.011 (ref 1.005–1.030)
Specific Gravity, Urine: 1.011 (ref 1.005–1.030)
pH: 5 (ref 5.0–8.0)
pH: 5 (ref 5.0–8.0)

## 2017-03-08 LAB — BASIC METABOLIC PANEL
Anion gap: 14 (ref 5–15)
BUN: 95 mg/dL — AB (ref 6–20)
CALCIUM: 9.3 mg/dL (ref 8.9–10.3)
CO2: 18 mmol/L — AB (ref 22–32)
CREATININE: 4.12 mg/dL — AB (ref 0.61–1.24)
Chloride: 106 mmol/L (ref 101–111)
GFR calc Af Amer: 15 mL/min — ABNORMAL LOW (ref >60)
GFR calc non Af Amer: 13 mL/min — ABNORMAL LOW (ref >60)
Glucose, Bld: 140 mg/dL — ABNORMAL HIGH (ref 65–99)
Potassium: 5.1 mmol/L (ref 3.5–5.1)
SODIUM: 138 mmol/L (ref 135–145)

## 2017-03-08 LAB — TYPE AND SCREEN
ABO/RH(D): O NEG
Antibody Screen: NEGATIVE

## 2017-03-08 LAB — CREATININE, URINE, RANDOM: Creatinine, Urine: 71.61 mg/dL

## 2017-03-08 LAB — RETICULOCYTES
RBC.: 3.14 MIL/uL — AB (ref 4.22–5.81)
RETIC COUNT ABSOLUTE: 34.5 10*3/uL (ref 19.0–186.0)
RETIC CT PCT: 1.1 % (ref 0.4–3.1)

## 2017-03-08 LAB — CBG MONITORING, ED: GLUCOSE-CAPILLARY: 107 mg/dL — AB (ref 65–99)

## 2017-03-08 LAB — FERRITIN: FERRITIN: 420 ng/mL — AB (ref 24–336)

## 2017-03-08 LAB — GLUCOSE, CAPILLARY
GLUCOSE-CAPILLARY: 244 mg/dL — AB (ref 65–99)
GLUCOSE-CAPILLARY: 124 mg/dL — AB (ref 65–99)
GLUCOSE-CAPILLARY: 175 mg/dL — AB (ref 65–99)

## 2017-03-08 LAB — PROTEIN, URINE, RANDOM: Total Protein, Urine: 19 mg/dL

## 2017-03-08 LAB — IRON AND TIBC
Iron: 49 ug/dL (ref 45–182)
Saturation Ratios: 20 % (ref 17.9–39.5)
TIBC: 242 ug/dL — ABNORMAL LOW (ref 250–450)
UIBC: 193 ug/dL

## 2017-03-08 LAB — FOLATE: FOLATE: 32 ng/mL (ref >5.9)

## 2017-03-08 LAB — SODIUM, URINE, RANDOM: SODIUM UR: 40 mmol/L

## 2017-03-08 LAB — VITAMIN B12: Vitamin B-12: 590 pg/mL (ref 180–914)

## 2017-03-08 MED ORDER — OXYCODONE HCL 5 MG PO TABS
15.0000 mg | ORAL_TABLET | Freq: Four times a day (QID) | ORAL | Status: DC | PRN
  Administered 2017-03-08 – 2017-03-09 (×4): 15 mg via ORAL
  Filled 2017-03-08 (×5): qty 3

## 2017-03-08 MED ORDER — GABAPENTIN 400 MG PO CAPS
400.0000 mg | ORAL_CAPSULE | Freq: Every morning | ORAL | Status: DC
  Administered 2017-03-08 – 2017-03-10 (×3): 400 mg via ORAL
  Filled 2017-03-08 (×3): qty 1

## 2017-03-08 MED ORDER — SENNOSIDES-DOCUSATE SODIUM 8.6-50 MG PO TABS
1.0000 | ORAL_TABLET | Freq: Every evening | ORAL | Status: DC | PRN
  Filled 2017-03-08: qty 1

## 2017-03-08 MED ORDER — SODIUM CHLORIDE 0.9 % IV SOLN
INTRAVENOUS | Status: AC
  Administered 2017-03-08 (×2): via INTRAVENOUS

## 2017-03-08 MED ORDER — INSULIN ASPART 100 UNIT/ML ~~LOC~~ SOLN: 0.0000 [IU] | Freq: Every day | SUBCUTANEOUS | Status: DC

## 2017-03-08 MED ORDER — CLOPIDOGREL BISULFATE 75 MG PO TABS
75.0000 mg | ORAL_TABLET | Freq: Every day | ORAL | Status: DC
  Administered 2017-03-08 – 2017-03-11 (×4): 75 mg via ORAL
  Filled 2017-03-08 (×4): qty 1

## 2017-03-08 MED ORDER — SODIUM CHLORIDE 0.9 % IV SOLN
INTRAVENOUS | Status: DC
  Administered 2017-03-08: 02:00:00 via INTRAVENOUS

## 2017-03-08 MED ORDER — BISACODYL 10 MG RE SUPP: 10.0000 mg | Freq: Every day | RECTAL | Status: DC | PRN

## 2017-03-08 MED ORDER — SODIUM CHLORIDE 0.9 % IV SOLN
INTRAVENOUS | Status: AC
  Administered 2017-03-08: 15:00:00 via INTRAVENOUS

## 2017-03-08 MED ORDER — HEPARIN SODIUM (PORCINE) 5000 UNIT/ML IJ SOLN
5000.0000 [IU] | Freq: Three times a day (TID) | INTRAMUSCULAR | Status: DC
Start: 2017-03-08 — End: 2017-03-11
  Administered 2017-03-08 – 2017-03-10 (×5): 5000 [IU] via SUBCUTANEOUS
  Filled 2017-03-08 (×8): qty 1

## 2017-03-08 MED ORDER — INSULIN ASPART 100 UNIT/ML ~~LOC~~ SOLN
0.0000 [IU] | Freq: Three times a day (TID) | SUBCUTANEOUS | Status: DC
  Administered 2017-03-08: 3 [IU] via SUBCUTANEOUS
  Administered 2017-03-08: 1 [IU] via SUBCUTANEOUS
  Administered 2017-03-09: 2 [IU] via SUBCUTANEOUS
  Administered 2017-03-09: 5 [IU] via SUBCUTANEOUS

## 2017-03-08 MED ORDER — ACETAMINOPHEN 650 MG RE SUPP: 650.0000 mg | Freq: Four times a day (QID) | RECTAL | Status: DC | PRN

## 2017-03-08 MED ORDER — ACETAMINOPHEN 325 MG PO TABS: 650.0000 mg | ORAL_TABLET | Freq: Four times a day (QID) | ORAL | Status: DC | PRN

## 2017-03-08 MED ORDER — ONDANSETRON HCL 4 MG/2ML IJ SOLN: 4.0000 mg | Freq: Four times a day (QID) | INTRAMUSCULAR | Status: DC | PRN

## 2017-03-08 MED ORDER — SODIUM CHLORIDE 0.9 % IV BOLUS (SEPSIS)
500.0000 mL | Freq: Once | INTRAVENOUS | Status: AC
  Administered 2017-03-08: 500 mL via INTRAVENOUS

## 2017-03-08 MED ORDER — PREDNISONE 5 MG PO TABS: 5.0000 mg | ORAL_TABLET | Freq: Every day | ORAL | Status: DC

## 2017-03-08 MED ORDER — ONDANSETRON HCL 4 MG PO TABS: 4.0000 mg | ORAL_TABLET | Freq: Four times a day (QID) | ORAL | Status: DC | PRN

## 2017-03-08 NOTE — H&P (Addendum)
History and Physical    Marc [name] OZH:[id number] DOB: Jun 20, 1940 DOA: 03/08/2017  PCP: [name], MD   Patient coming from: Home  Chief Complaint: Sent by PCP for increased creatinine on outpatient blood work   HPI: Marc [name] is a 77 y.o. male with medical history significant for coronary artery disease, hypertension, type 2 diabetes mellitus, and chronic kidney disease stage III, now presenting to the emergency department at the direction of his PCP for evaluation of elevated creatinine on outpatient blood work.  Patient reports that he has had a mild decrease in appetite, but otherwise feels well.  He had been prescribed 80 mg Lasix daily for chronic diastolic CHF but reports that he increase this on his own to twice daily due to some peripheral swelling.  Swelling completely resolved with the increase dosing but he was noted to have an elevated creatinine on outpatient blood work and was directed to the ED for further evaluation.  He denies any flank pain, fevers, or chills.  No hematuria or change in urination.  ED Course: Upon arrival to the ED, patient is found to be afebrile, saturating well on room air, and with vitals otherwise chemistry panel is notable for a creatinine of 4.25, up from 1.80 in August 2018.  CBC is notable for a normocytic anemia with hemoglobin of 9.5, down from 14.0 in August.  Patient was given 500 cc normal saline and started on he remains hemodynamically stable, and no apparent respiratory distress, and will be admitted to the medical-surgical unit for ongoing evaluation and management of acute kidney injury superimposed on chronic kidney disease stage III.  Review of Systems:  All other systems reviewed and apart from HPI, are negative.  Past Medical History:  Diagnosis Date  . Anemia    low iron  . ARF (acute renal failure) ([place]) 12/2014  . Arthritis   . Bradycardia   . Chronic bronchitis ([place])   . CKD (chronic kidney disease),  stage III ([place])   . Colon polyp   . Depression    occassional.  Situational  . GERD (gastroesophageal reflux disease)   . Gout   . Gout   . History of blood transfusion   . History of GI bleed 2007  . Hypercholesteremia   . Hypertension   . Neuropathy   . PAD (peripheral artery disease) ([place])   . Pneumonia    denies  . Tobacco abuse disorder 03/10/2012  . Type II diabetes mellitus ([place])   . Ulcer disease     Past Surgical History:  Procedure Laterality Date  . AMPUTATION Right 02/07/2015   Procedure: GREAT TOE RAY AMPUTATION RIGHT ;  Surgeon: Angelia [name], MD;  Location: [place];  Service: Vascular;  Laterality: Right;  . AMPUTATION TOE Right 03/21/2015   Procedure: AMPUTATION TOE-RIGHT SECOND TOE;  Surgeon: Angelia [name], MD;  Location: [place];  Service: Vascular;  Laterality: Right;  . ANTERIOR CERVICAL DECOMP/DISCECTOMY FUSION  ~ 2007  . ANTERIOR LAT LUMBAR FUSION Left 01/19/2014   Procedure: LATERAL INTERBODY FUSION 1 LEVEL;  Surgeon: Sinclair Ship, MD;  Location: [place];  Service: Orthopedics;  Laterality: Left;  Left lumbar 3-4 lateral interbody fusion with instrumentation, allograft  . APPLICATION OF WOUND VAC Right 02/07/2015   Procedure:  APPLICATION OF WOUND VAC right great toe amputation site.;  Surgeon: Angelia [name], MD;  Location: [place];  Service: Vascular;  Laterality: Right;  . APPLICATION OF WOUND VAC Right 03/21/2015  Procedure: APPLICATION OF WOUND VAC RIGHT FOOT;  Surgeon: Angelia [name], MD;  Location: [place];  Service: Vascular;  Laterality: Right;  . BACK SURGERY    . BYPASS GRAFT FEMORAL-PERONEAL Right 02/07/2015   Procedure: BYPASS GRAFT FEMORAL-PERONEAL Trunk WITH VEIN graft  right leg.;  Surgeon: Angelia [name], MD;  Location: [place];  Service: Vascular;  Laterality: Right;  . COLONOSCOPY    . ENDARTERECTOMY TIBIOPERONEAL Right 02/07/2015   Procedure: ENDARTERECTOMY TIBIOPERONEAL;  Surgeon: Angelia [name], MD;   Location: [place];  Service: Vascular;  Laterality: Right;  . EXCISIONAL TOTAL KNEE ARTHROPLASTY WITH ANTIBIOTIC SPACERS Right 04/14/2015   Procedure: RIGHT KNEE [place] OUT AND PLACEMENT OF DRAINS;  Surgeon: Frederik Pear, MD;  Location: [place];  Service: Orthopedics;  Laterality: Right;  . EYE SURGERY Bilateral    cataract surgery with lens implant  . HERNIA REPAIR  ~ 2007   UHR (02/04/2012)  . ILIAC ARTERY STENT Left    left common/notes (02/04/2012)  . INTRAOPERATIVE ARTERIOGRAM Right 02/07/2015   Procedure: INTRA OPERATIVE ARTERIOGRAM;  Surgeon: Angelia [name], MD;  Location: [place];  Service: Vascular;  Laterality: Right;  . IRRIGATION AND DEBRIDEMENT KNEE Right 04/14/2015   Procedure: IRRIGATION AND DEBRIDEMENT KNEE;  Surgeon: Frederik Pear, MD;  Location: [place];  Service: Orthopedics;  Laterality: Right;  . LOWER EXTREMITY ANGIOGRAM N/A 02/04/2012   Procedure: LOWER EXTREMITY ANGIOGRAM;  Surgeon: Lorretta Harp, MD;  Location: [facility] CATH LAB;  Service: Cardiovascular;  Laterality: N/A;  . LUMBAR LAMINECTOMY  11/14  . PATCH ANGIOPLASTY Right 02/07/2015   Procedure: Vein PATCH ANGIOPLASTY to tibioperoneal trunk;  Surgeon: Angelia [name], MD;  Location: [place];  Service: Vascular;  Laterality: Right;  . PERCUTANEOUS STENT INTERVENTION Left 02/04/2012   Procedure: PERCUTANEOUS STENT INTERVENTION;  Surgeon: Lorretta Harp, MD;  Location: [facility] CATH LAB;  Service: Cardiovascular;  Laterality: Left;  lt ext iliac stent  . PERIPHERAL VASCULAR CATHETERIZATION N/A 02/02/2015   Procedure: Abdominal Aortogram w/Lower Extremity;  Surgeon: Angelia [name], MD;  Location: [place] CV LAB;  Service: Cardiovascular;  Laterality: N/A;  . POLYPECTOMY    . REPLACEMENT TOTAL KNEE Right   . SPINAL CORD STIMULATOR INSERTION N/A 09/20/2016   Procedure: LUMBAR SPINAL CORD STIMULATOR INSERTION;  Surgeon: Clydell Hakim, MD;  Location: [place];  Service: Neurosurgery;  Laterality: N/A;  LUMBAR SPINAL CORD STIMULATOR  INSERTION  . SPLENECTOMY  ~ 1957  . TONSILLECTOMY  ~ 1947  . TOTAL KNEE ARTHROPLASTY Right 02/03/2013   Procedure: TOTAL KNEE ARTHROPLASTY;  Surgeon: [name], MD;  Location: [place];  Service: Orthopedics;  Laterality: Right;  . UPPER ENDOSCOPY W/ SCLEROTHERAPY  ~ 2007  . WOUND DEBRIDEMENT Right 03/21/2015   Procedure: DEBRIDEMENT WOUND of RIGHT FOOT;  Surgeon: Angelia [name], MD;  Location: [place];  Service: Vascular;  Laterality: Right;     reports that he has been smoking cigarettes.  He has a 58.00 pack-year smoking history. He has quit using smokeless tobacco. His smokeless tobacco use included chew. He reports that he does not drink alcohol or use drugs.  Allergies  Allergen Reactions  . Actos [Pioglitazone] Swelling and Other (See Comments)    LOWER EXTREMITY EDEMA     Family History  Problem Relation Age of Onset  . Colon cancer Paternal Uncle        Uncle  . Heart disease Father   . Hypertension Father   . Heart attack Father   .  Heart disease Mother   . Diabetes Mother   . Hypertension Mother      Prior to Admission medications   Medication Sig Start Date End Date Taking? Authorizing Provider  allopurinol (ZYLOPRIM) 300 MG tablet Take 300 mg by mouth at bedtime.    Yes [provider]  clopidogrel (PLAVIX) 75 MG tablet Take 75 mg by mouth daily.    Yes [provider]  colchicine 0.6 MG tablet Take 0.6 mg by mouth daily.   Yes [provider]  docusate sodium (COLACE) 100 MG capsule Take 100 mg by mouth 2 (two) times daily.   Yes [provider]  furosemide (LASIX) 80 MG tablet Take 1 tablet (80 mg total) by mouth daily. 10/25/15 03/08/25 Yes Leo Grosser, MD  gabapentin (NEURONTIN) 600 MG tablet Take 1,200 mg by mouth every 12 (twelve) hours.    Yes [provider]  glimepiride (AMARYL) 4 MG tablet Take 8 mg by mouth daily with breakfast.    Yes [provider]  lisinopril (PRINIVIL,ZESTRIL) 20 MG tablet  Take 20 mg by mouth daily. 08/29/15  Yes [provider]  oxyCODONE (ROXICODONE) 15 MG immediate release tablet Take 15 mg by mouth every 6 (six) hours as needed for pain.  01/31/17  Yes [provider]  pantoprazole (PROTONIX) 40 MG tablet Take 40 mg by mouth daily. 08/29/15  Yes [provider]  predniSONE (DELTASONE) 5 MG tablet Take 5 mg by mouth daily. 02/27/17  Yes [provider]  Prenatal Vit-Fe Fumarate-FA (MULTIVITAMIN-PRENATAL) 27-0.8 MG TABS tablet Take 1 tablet by mouth daily at 12 noon.   Yes [provider]  aspirin EC 325 MG tablet Take 1 tablet (325 mg total) by mouth 2 (two) times daily. Patient not taking: Reported on 03/08/2017 04/17/15   Leighton Parody, PA-C    Physical Exam: Vitals:   03/07/17 1828 03/07/17 2217 03/08/17 0116 03/08/17 0117  BP: 139/69 132/65  119/75  Pulse: 95 94  95  Resp: 18 17  11   Temp: 98 F (36.7 C) 97.9 F (36.6 C)    TempSrc: Oral     SpO2: 99% 100%  99%  Weight:   77.1 kg (170 lb)   Height:   5\' 8"  (1.727 m)       Constitutional: NAD, calm  Eyes: PERTLA, lids and conjunctivae normal ENMT: Mucous membranes are moist. Posterior pharynx clear of any exudate or lesions.   Neck: normal, supple, no masses, no thyromegaly Respiratory: clear to auscultation bilaterally, no wheezing, no crackles. Normal respiratory effort.    Cardiovascular: S1 & S2 heard, regular rate and rhythm. No significant JVD. Abdomen: No distension, no tenderness, no masses palpated. Bowel sounds normal.  Musculoskeletal: no clubbing / cyanosis. No joint deformity upper and lower extremities.    Skin: Ecchymoses about the bilateral arms. Poor turgor. Warm, dry, well-perfused. Neurologic: CN 2-12 grossly intact. Sensation intact. Strength 5/5 in all 4 limbs.  Psychiatric: Alert and oriented x 3. Calm, cooperative.     Labs on Admission: I have personally reviewed following labs and imaging studies  CBC: Recent Labs  Lab  03/07/17 1903  WBC 7.9  HGB 9.5*  HCT 29.1*  MCV 97.7  PLT 846   Basic Metabolic Panel: Recent Labs  Lab 03/07/17 1903  NA 138  K 4.8  CL 106  CO2 19*  GLUCOSE 244*  BUN 93*  CREATININE 4.25*  CALCIUM 9.4   GFR: Estimated Creatinine Clearance: 14.1 mL/min (A) (by C-G formula  based on SCr of 4.25 mg/dL (H)). Liver Function Tests: No results for input(s): AST, ALT, ALKPHOS, BILITOT, PROT, ALBUMIN in the last 168 hours. No results for input(s): LIPASE, AMYLASE in the last 168 hours. No results for input(s): AMMONIA in the last 168 hours. Coagulation Profile: No results for input(s): INR, PROTIME in the last 168 hours. Cardiac Enzymes: No results for input(s): CKTOTAL, CKMB, CKMBINDEX, TROPONINI in the last 168 hours. BNP (last 3 results) No results for input(s): PROBNP in the last 8760 hours. HbA1C: No results for input(s): HGBA1C in the last 72 hours. CBG: Recent Labs  Lab 03/07/17 1828  GLUCAP 248*   Lipid Profile: No results for input(s): CHOL, HDL, LDLCALC, TRIG, CHOLHDL, LDLDIRECT in the last 72 hours. Thyroid Function Tests: No results for input(s): TSH, T4TOTAL, FREET4, T3FREE, THYROIDAB in the last 72 hours. Anemia Panel: No results for input(s): VITAMINB12, FOLATE, FERRITIN, TIBC, IRON, RETICCTPCT in the last 72 hours. Urine analysis:    Component Value Date/Time   COLORURINE STRAW (A) 01/02/2016 1720   APPEARANCEUR CLEAR 01/02/2016 1720   LABSPEC 1.006 01/02/2016 1720   PHURINE 7.0 01/02/2016 1720   GLUCOSEU NEGATIVE 01/02/2016 1720   HGBUR NEGATIVE 01/02/2016 1720   BILIRUBINUR NEGATIVE 01/02/2016 1720   KETONESUR NEGATIVE 01/02/2016 1720   PROTEINUR NEGATIVE 01/02/2016 1720   UROBILINOGEN 1.0 01/12/2014 1004   NITRITE NEGATIVE 01/02/2016 1720   LEUKOCYTESUR NEGATIVE 01/02/2016 1720   Sepsis Labs: @LABRCNTIP (procalcitonin:4,lacticidven:4) )No results found for this or any previous visit (from the past 240 hour(s)).   Radiological Exams on  Admission: No results found.  EKG: Not performed.   Assessment/Plan   1. Acute kidney injury superimposed on CKD stage III  - Sent by PCP after outpatient blood work was concerning for acute on chronic renal failure  - SCr is 4.25 on admission, up from priors in 1.5-1.8 range  - Slight metabolic acidosis, no hyperkalemia, hypertension, or hypervolemia; reports still urinating normally  - Most likely a prerenal azotemia given his increased Lasix use, exacerbated by continued lisinopril  - Treated in ED with 500 cc NS and started on NS infusion  - Check renal US and urine studies, continue IVF hydration, renally-dose medications, avoid nephrotoxins, repeat chem panel in am   2. Normocytic anemia  - Hgb is 9.5 on admission, down from 14.0 on most recent prior from August 2018  - Denies bleeding - Check anemia panel    3. CAD  - No anginal complaints  - Continue Plavix, hold ACE until renal fxn stabilizes   4. Chronic pain - Stable  - Continue home-regimen with gabapentin and prn Oxy IR    5. Chronic diastolic CHF  - Hypovolemic on admission   - Hold Lasix while hydrating  - Hold lisinopril until renal function stabilizes   6. Type II DM  - A1c was 6.4% in August 2018  - Managed at home with glimepiride, held on admission  - Follow CBG's and start a low-intensity SSI with Novolog   DVT prophylaxis: sq heparin  Code Status: Full  Family Communication: Discussed with patient Disposition Plan: Admit to med-surg Consults called: None Admission status: Inpatient    Vianne Bulls, MD [facility] Pager [phone]  If 7PM-7AM, please contact night-coverage [email or web address] Password [email or web address]  03/08/2017, 2:24 AM

## 2017-03-08 NOTE — ED Provider Notes (Signed)
TIME SEEN: 1:31 AM  CHIEF COMPLAINT: Abnormal lab  HPI: Patient is a 77 year old male with history of hypertension, hyperlipidemia, diabetes, chronic kidney disease who presents to the emergency department with concerns for abnormal creatinine.  States he was told by his primary care physician that his kidneys were failing.  He states he is still making urine.  He denies any NSAID use.  States he was taken off of aspirin and lisinopril recently.  He does state that he is supposed to take furosemide 80 mg once daily for peripheral edema and that he has increased this himself to twice daily.  States he does not have any swelling in his legs.  No fevers, cough, vomiting, diarrhea.  States he has had decreased appetite but states he drinks approximately a liter or 2 of lemonade every day.  Denies chest pain or shortness of breath.  States that he does not ever want to be on dialysis.  He denies any recent NSAID use.  States he is also no longer on colchicine.  ROS: See HPI Constitutional: no fever  Eyes: no drainage  ENT: no runny nose   Cardiovascular:  no chest pain  Resp: no SOB  GI: no vomiting GU: no dysuria Integumentary: no rash  Allergy: no hives  Musculoskeletal: no leg swelling  Neurological: no slurred speech ROS otherwise negative  PAST MEDICAL HISTORY/PAST SURGICAL HISTORY:  Past Medical History:  Diagnosis Date  . Anemia    low iron  . ARF (acute renal failure) ([place]) 12/2014  . Arthritis   . Bradycardia   . Chronic bronchitis ([place])   . CKD (chronic kidney disease), stage III ([place])   . Colon polyp   . Depression    occassional.  Situational  . GERD (gastroesophageal reflux disease)   . Gout   . Gout   . History of blood transfusion   . History of GI bleed 2007  . Hypercholesteremia   . Hypertension   . Neuropathy   . PAD (peripheral artery disease) ([place])   . Pneumonia    denies  . Tobacco abuse disorder 03/10/2012  . Type II diabetes mellitus ([place])   . Ulcer  disease     MEDICATIONS:  Prior to Admission medications   Medication Sig Start Date End Date Taking? Authorizing Provider  allopurinol (ZYLOPRIM) 300 MG tablet Take 300 mg by mouth at bedtime.     [provider]  aspirin EC 325 MG tablet Take 1 tablet (325 mg total) by mouth 2 (two) times daily. 04/17/15   Leighton Parody, PA-C  atorvastatin (LIPITOR) 10 MG tablet Take 10 mg by mouth daily.    [provider]  cephALEXin (KEFLEX) 500 MG capsule Take 1 capsule (500 mg total) by mouth 3 (three) times daily. 09/20/16   Clydell Hakim, MD  clopidogrel (PLAVIX) 75 MG tablet Take 75 mg by mouth daily.     [provider]  colchicine 0.6 MG tablet Take 0.6 mg by mouth daily.    [provider]  docusate sodium (COLACE) 100 MG capsule Take 100 mg by mouth 2 (two) times daily.    [provider]  doxycycline (MONODOX) 100 MG capsule TAKE 1 CAPSULE BY MOUTH  TWICE DAILY 12/23/16   Comer, Okey Regal, MD  furosemide (LASIX) 80 MG tablet Take 1 tablet (80 mg total) by mouth daily. 10/25/15 09/16/16  Leo Grosser, MD  gabapentin (NEURONTIN) 600 MG tablet Take 1,200 mg by mouth every 12 (twelve) hours.  [provider]  glimepiride (AMARYL) 4 MG tablet Take 8 mg by mouth daily with breakfast.     [provider]  lisinopril (PRINIVIL,ZESTRIL) 20 MG tablet Take 20 mg by mouth daily. 08/29/15   [provider]  oxycodone (ROXICODONE) 30 MG immediate release tablet Take 1 tablet (30 mg total) by mouth every 6 (six) hours as needed for pain (postsurgical pain). 09/20/16   Clydell Hakim, MD  pantoprazole (PROTONIX) 40 MG tablet Take 40 mg by mouth daily. 08/29/15   [provider]  Prenatal Vit-Fe Fumarate-FA (MULTIVITAMIN-PRENATAL) 27-0.8 MG TABS tablet Take 1 tablet by mouth daily at 12 noon.    [provider]    ALLERGIES:  Allergies  Allergen Reactions  . Actos [Pioglitazone] Swelling and Other (See Comments)    LOWER  EXTREMITY EDEMA     SOCIAL HISTORY:  Social History   Tobacco Use  . Smoking status: Current Every Day Smoker    Packs/day: 1.00    Years: 58.00    Pack years: 58.00    Types: Cigarettes  . Smokeless tobacco: Former Systems developer    Types: Chew  . Tobacco comment: 1-2 pks per day.   Substance Use Topics  . Alcohol use: No    Alcohol/week: 0.0 oz    FAMILY HISTORY: Family History  Problem Relation Age of Onset  . Colon cancer Paternal Uncle        Uncle  . Heart disease Father   . Hypertension Father   . Heart attack Father   . Heart disease Mother   . Diabetes Mother   . Hypertension Mother     EXAM: BP 119/75 (BP Location: Left Arm)   Pulse 95   Temp 97.9 F (36.6 C)   Resp 11   Ht 5\' 8"  (1.727 m)   Wt 77.1 kg (170 lb)   SpO2 99%   BMI 25.85 kg/m  CONSTITUTIONAL: Alert and oriented and responds appropriately to questions.  Elderly, chronically ill-appearing HEAD: Normocephalic EYES: Conjunctivae clear, pupils appear equal, EOMI ENT: normal nose; moist mucous membranes NECK: Supple, no meningismus, no nuchal rigidity, no LAD  CARD: RRR; S1 and S2 appreciated; no murmurs, no clicks, no rubs, no gallops RESP: Normal chest excursion without splinting or tachypnea; breath sounds clear and equal bilaterally; no wheezes, no rhonchi, no rales, no hypoxia or respiratory distress, speaking full sentences ABD/GI: Normal bowel sounds; non-distended; soft, non-tender, no rebound, no guarding, no peritoneal signs, no hepatosplenomegaly BACK:  The back appears normal and is non-tender to palpation, there is no CVA tenderness EXT: Normal ROM in all joints; non-tender to palpation; no edema; normal capillary refill; no cyanosis, no calf tenderness or swelling    SKIN: Normal color for age and race; warm; no rash NEURO: Moves all extremities equally PSYCH: The patient's mood and manner are appropriate. Grooming and personal hygiene are appropriate.  MEDICAL DECISION MAKING: Patient  here with acute on chronic renal failure.  Has a creatinine that normally ranges between 1.5 and 1.8.  Last was 1.8 in August 2018.  Today it is 4.25.  He also has mild anemia with hemoglobin of 9.5 which is likely secondary to this chronic kidney disease.  Blood glucose is mildly elevated with a bicarb of 19 but normal anion gap.  I suspect that his bicarb is low secondary to uremia and not DKA.  He has no peripheral edema on exam.  His lungs are clear.  He denies a history of CHF.  Plan is to  hydrate patient as I suspect that his acute on chronic renal failure is secondary to aggressive diuresis and is prerenal in nature.  Patient will need to be admitted to the hospitalist.  PCP is Dr. Arelia Sneddon.  ED PROGRESS: 1:56 AM Discussed patient's case with hospitalist, Dr. Myna Hidalgo.  I have recommended admission and patient (and family if present) agree with this plan. Admitting physician will place admission orders.   I reviewed all nursing notes, vitals, pertinent previous records, EKGs, lab and urine results, imaging (as available).       , Delice Bison, DO 03/08/17 0157

## 2017-03-08 NOTE — ED Notes (Signed)
Delay in lab draw,  Pt currently in ultrasound. 

## 2017-03-08 NOTE — H&P (Signed)
Brief note on Marc [name].  Patient was admitted earlier today.  Kindly refer to the H&P done by Dr. Christia Reading Opyd.  Essentially, the patient is a 77 year old Caucasian male with past medical history significant for diabetes mellitus, chronic kidney disease stage III, coronary artery disease, hypertension and peripheral artery disease status post amputation of the right first and right second toe.  Patient was admitted with acute kidney injury on chronic kidney disease stage III.  According to the patient, he doubled his Lasix from 80 mg p.o. once daily to 160 mg p.o. once daily.  The patient denied use of NSAIDs.  Patient also reported episode of hypoglycemia, and reported having passed out.  He does not know if there was a documented drop in pressure/hemodynamic instability.  Patient's baseline serum creatinine is about 1.48.  On presentation, emergency room creatinine was 4.25.  With increasing the diuretics at home, patient reported excessive micturition.  Patient is currently on IV fluids.  The AKI is being worked up.  I will also consulted the nephrology team.  Further management will depend on hospital course.  Dr. Dana Allan.

## 2017-03-08 NOTE — ED Notes (Signed)
Went to check on patient and he complained that "someone's been in my damn room every 15 minutes bothering me".  Apologized for patient inconvenience and explained that I had not been in every 15 minutes and that I had only walked by his room for the past two hours (he was complaining of everyone; lab, registration, MD, etc). Advised patient that we would attempt to limit visits to his room. Per lab, pharmacy tech, registration and this nurse; patient is difficult and does not like to comply with questioning, orders, etc. Patient has been rude to multiple staff members.

## 2017-03-08 NOTE — ED Notes (Signed)
Ordered Breakfast Tray  

## 2017-03-08 NOTE — Consult Note (Signed)
[facility] Consult Note     Date: 03/08/2017                  Patient Name:  Marc [name]  MRN: [id number]  DOB: 1940/09/22  Age / Sex: 77 y.o., male         PCP: [name], MD                 Service Requesting Consult: Internal Medicine                 Reason for Consult: AKI            Chief Complaint: elevated Creatinine on outpatient labs HPI:   77 year old male with T2DM, CAD, HTN, PAD s/p multiple toe amputation, presenting for AKI on CKD III.  He was in his usual state of health until about 2 weeks ago when he noted LE edema, prompting him to double his home lasix dose from 80 mg PO daily to 160 mg PO daily. Patient reports he took the extra lasix for about 8 days over the past two weeks with subsequent improvement in his LE edema. He had increased urination with the additional medication. He denies NSAID use. He denies any peripheral edema at this time. He was noted to have elevated creatinine on outpatient labs and sent in to the ED for admission.  Past Medical History:  Diagnosis Date  . Anemia    low iron  . ARF (acute renal failure) ([place]) 12/2014  . Arthritis   . Bradycardia   . Chronic bronchitis ([place])   . CKD (chronic kidney disease), stage III ([place])   . Colon polyp   . Depression    occassional.  Situational  . GERD (gastroesophageal reflux disease)   . Gout   . Gout   . History of blood transfusion   . History of GI bleed 2007  . Hypercholesteremia   . Hypertension   . Neuropathy   . PAD (peripheral artery disease) ([place])   . Pneumonia    denies  . Tobacco abuse disorder 03/10/2012  . Type II diabetes mellitus ([place])   . Ulcer disease     Past Surgical History:  Procedure Laterality Date  . AMPUTATION Right 02/07/2015   Procedure: GREAT TOE RAY AMPUTATION RIGHT ;  Surgeon: Angelia [name], MD;  Location: [place];  Service: Vascular;  Laterality: Right;  . AMPUTATION TOE Right 03/21/2015   Procedure: AMPUTATION  TOE-RIGHT SECOND TOE;  Surgeon: Angelia [name], MD;  Location: [place];  Service: Vascular;  Laterality: Right;  . ANTERIOR CERVICAL DECOMP/DISCECTOMY FUSION  ~ 2007  . ANTERIOR LAT LUMBAR FUSION Left 01/19/2014   Procedure: LATERAL INTERBODY FUSION 1 LEVEL;  Surgeon: Sinclair Ship, MD;  Location: [place];  Service: Orthopedics;  Laterality: Left;  Left lumbar 3-4 lateral interbody fusion with instrumentation, allograft  . APPLICATION OF WOUND VAC Right 02/07/2015   Procedure:  APPLICATION OF WOUND VAC right great toe amputation site.;  Surgeon: Angelia [name], MD;  Location: [place];  Service: Vascular;  Laterality: Right;  . APPLICATION OF WOUND VAC Right 03/21/2015   Procedure: APPLICATION OF WOUND VAC RIGHT FOOT;  Surgeon: Angelia [name], MD;  Location: [place];  Service: Vascular;  Laterality: Right;  . BACK SURGERY    . BYPASS GRAFT FEMORAL-PERONEAL Right 02/07/2015   Procedure: BYPASS GRAFT FEMORAL-PERONEAL Trunk WITH VEIN graft  right leg.;  Surgeon: Angelia [name], MD;  [facility];  Service: Vascular;  Laterality: Right;  . COLONOSCOPY    . ENDARTERECTOMY TIBIOPERONEAL Right 02/07/2015   Procedure: ENDARTERECTOMY TIBIOPERONEAL;  Surgeon: Angelia [name], MD;  Location: [place];  Service: Vascular;  Laterality: Right;  . EXCISIONAL TOTAL KNEE ARTHROPLASTY WITH ANTIBIOTIC SPACERS Right 04/14/2015   Procedure: RIGHT KNEE [place] OUT AND PLACEMENT OF DRAINS;  Surgeon: Frederik Pear, MD;  Location: [place];  Service: Orthopedics;  Laterality: Right;  . EYE SURGERY Bilateral    cataract surgery with lens implant  . HERNIA REPAIR  ~ 2007   UHR (02/04/2012)  . ILIAC ARTERY STENT Left    left common/notes (02/04/2012)  . INTRAOPERATIVE ARTERIOGRAM Right 02/07/2015   Procedure: INTRA OPERATIVE ARTERIOGRAM;  Surgeon: Angelia [name], MD;  Location: [place];  Service: Vascular;  Laterality: Right;  . IRRIGATION AND DEBRIDEMENT KNEE Right 04/14/2015   Procedure: IRRIGATION  AND DEBRIDEMENT KNEE;  Surgeon: Frederik Pear, MD;  Location: [place];  Service: Orthopedics;  Laterality: Right;  . LOWER EXTREMITY ANGIOGRAM N/A 02/04/2012   Procedure: LOWER EXTREMITY ANGIOGRAM;  Surgeon: Lorretta Harp, MD;  Location: [facility] CATH LAB;  Service: Cardiovascular;  Laterality: N/A;  . LUMBAR LAMINECTOMY  11/14  . PATCH ANGIOPLASTY Right 02/07/2015   Procedure: Vein PATCH ANGIOPLASTY to tibioperoneal trunk;  Surgeon: Angelia [name], MD;  Location: [place];  Service: Vascular;  Laterality: Right;  . PERCUTANEOUS STENT INTERVENTION Left 02/04/2012   Procedure: PERCUTANEOUS STENT INTERVENTION;  Surgeon: Lorretta Harp, MD;  Location: [facility] LAB;  Service: Cardiovascular;  Laterality: Left;  lt ext iliac stent  . PERIPHERAL VASCULAR CATHETERIZATION N/A 02/02/2015   Procedure: Abdominal Aortogram w/Lower Extremity;  Surgeon: Angelia [name], MD;  Location: [place] CV LAB;  Service: Cardiovascular;  Laterality: N/A;  . POLYPECTOMY    . REPLACEMENT TOTAL KNEE Right   . SPINAL CORD STIMULATOR INSERTION N/A 09/20/2016   Procedure: LUMBAR SPINAL CORD STIMULATOR INSERTION;  Surgeon: Clydell Hakim, MD;  Location: [place];  Service: Neurosurgery;  Laterality: N/A;  LUMBAR SPINAL CORD STIMULATOR INSERTION  . SPLENECTOMY  ~ 1957  . TONSILLECTOMY  ~ 1947  . TOTAL KNEE ARTHROPLASTY Right 02/03/2013   Procedure: TOTAL KNEE ARTHROPLASTY;  Surgeon: [name], MD;  Location: [place];  Service: Orthopedics;  Laterality: Right;  . UPPER ENDOSCOPY W/ SCLEROTHERAPY  ~ 2007  . WOUND DEBRIDEMENT Right 03/21/2015   Procedure: DEBRIDEMENT WOUND of RIGHT FOOT;  Surgeon: Angelia [name], MD;  Location: [facility] OR;  Service: Vascular;  Laterality: Right;    Family History  Problem Relation Age of Onset  . Colon cancer Paternal Uncle        Uncle  . Heart disease Father   . Hypertension Father   . Heart attack Father   . Heart disease Mother   . Diabetes Mother   . Hypertension Mother    Social  History:  reports that he has been smoking cigarettes.  He has a 58.00 pack-year smoking history. He has quit using smokeless tobacco. His smokeless tobacco use included chew. He reports that he does not drink alcohol or use drugs.  Allergies:  Allergies  Allergen Reactions  . Actos [Pioglitazone] Swelling and Other (See Comments)    LOWER EXTREMITY EDEMA     Medications Prior to Admission  Medication Sig Dispense Refill  . allopurinol (ZYLOPRIM) 300 MG tablet Take 300 mg by mouth at bedtime.     . clopidogrel (PLAVIX) 75 MG tablet Take 75 mg by  mouth daily.     . colchicine 0.6 MG tablet Take 0.6 mg by mouth daily.    [name] docusate sodium (COLACE) 100 MG capsule Take 100 mg by mouth 2 (two) times daily.    . furosemide (LASIX) 80 MG tablet Take 1 tablet (80 mg total) by mouth daily. 5 tablet 0  . gabapentin (NEURONTIN) 600 MG tablet Take 1,200 mg by mouth every 12 (twelve) hours.     [name] glimepiride (AMARYL) 4 MG tablet Take 8 mg by mouth daily with breakfast.     . lisinopril (PRINIVIL,ZESTRIL) 20 MG tablet Take 20 mg by mouth daily.    [name] oxyCODONE (ROXICODONE) 15 MG immediate release tablet Take 15 mg by mouth every 6 (six) hours as needed for pain.     . pantoprazole (PROTONIX) 40 MG tablet Take 40 mg by mouth daily.    . predniSONE (DELTASONE) 5 MG tablet Take 5 mg by mouth daily.    . Prenatal Vit-Fe Fumarate-FA (MULTIVITAMIN-PRENATAL) 27-0.8 MG TABS tablet Take 1 tablet by mouth daily at 12 noon.    [name] aspirin EC 325 MG tablet Take 1 tablet (325 mg total) by mouth 2 (two) times daily. (Patient not taking: Reported on 03/08/2017) 30 tablet 0    Results for orders placed or performed during the hospital encounter of 03/08/17 (from the past 48 hour(s))  CBG monitoring, ED     Status: Abnormal   Collection Time: 03/07/17  6:28 PM  Result Value Ref Range   Glucose-Capillary 248 (H) 65 - 99 mg/dL  Basic metabolic panel     Status: Abnormal   Collection Time: 03/07/17  7:03 PM  Result Value  Ref Range   Sodium 138 135 - 145 mmol/L   Potassium 4.8 3.5 - 5.1 mmol/L   Chloride 106 101 - 111 mmol/L   CO2 19 (L) 22 - 32 mmol/L   Glucose, Bld 244 (H) 65 - 99 mg/dL   BUN 93 (H) 6 - 20 mg/dL   Creatinine, Ser 4.25 (H) 0.61 - 1.24 mg/dL   Calcium 9.4 8.9 - 10.3 mg/dL   GFR calc non Af Amer 12 (L) >60 mL/min   GFR calc Af Amer 14 (L) >60 mL/min    Comment: (NOTE) The eGFR has been calculated using the CKD EPI equation. This calculation has not been validated in all clinical situations. eGFR's persistently <60 mL/min signify possible Chronic Kidney Disease.    Anion gap 13 5 - 15    Comment: Performed at [facility] [address], [place], [place] [address]  CBC     Status: Abnormal   Collection Time: 03/07/17  7:03 PM  Result Value Ref Range   WBC 7.9 4.0 - 10.5 K/uL   RBC 2.98 (L) 4.22 - 5.81 MIL/uL   Hemoglobin 9.5 (L) 13.0 - 17.0 g/dL   HCT 29.1 (L) 39.0 - 52.0 %   MCV 97.7 78.0 - 100.0 fL   MCH 31.9 26.0 - 34.0 pg   MCHC 32.6 30.0 - 36.0 g/dL   RDW 14.6 11.5 - 15.5 %   Platelets 183 150 - 400 K/uL    Comment: Performed at [facility], [place] [address] [place], [place] [address]  Basic metabolic panel     Status: Abnormal   Collection Time: 03/08/17  2:35 AM  Result Value Ref Range   Sodium 138 135 - 145 mmol/L   Potassium 5.1 3.5 - 5.1 mmol/L   Chloride 106 101 - 111 mmol/L  CO2 18 (L) 22 - 32 mmol/L   Glucose, Bld 140 (H) 65 - 99 mg/dL   BUN 95 (H) 6 - 20 mg/dL   Creatinine, Ser 4.12 (H) 0.61 - 1.24 mg/dL   Calcium 9.3 8.9 - 10.3 mg/dL   GFR calc non Af Amer 13 (L) >60 mL/min   GFR calc Af Amer 15 (L) >60 mL/min    Comment: (NOTE) The eGFR has been calculated using the CKD EPI equation. This calculation has not been validated in all clinical situations. eGFR's persistently <60 mL/min signify possible Chronic Kidney Disease.    Anion gap 14 5 - 15    Comment: Performed at [facility] [address] [place], [place] [address]   Vitamin B12     Status: None   Collection Time: 03/08/17  2:35 AM  Result Value Ref Range   Vitamin B-12 590 180 - 914 pg/mL    Comment: (NOTE) This assay is not validated for testing neonatal or myeloproliferative syndrome specimens for Vitamin B12 levels. Performed at [facility], [place] [address], [place], [place] [address]   Folate     Status: None   Collection Time: 03/08/17  2:35 AM  Result Value Ref Range   Folate 32.0 >5.9 ng/mL    Comment: Performed at [facility], [place] [address] [place], [place] [address]  Iron and TIBC     Status: Abnormal   Collection Time: 03/08/17  2:35 AM  Result Value Ref Range   Iron 49 45 - 182 ug/dL   TIBC 242 (L) 250 - 450 ug/dL   Saturation Ratios 20 17.9 - 39.5 %   UIBC 193 ug/dL    Comment: Performed at [facility], [place] [address] [place], [place] [address]  Ferritin     Status: Abnormal   Collection Time: 03/08/17  2:35 AM  Result Value Ref Range   Ferritin 420 (H) 24 - 336 ng/mL    Comment: Performed at [facility], [place] [address], [place], [place] [address]  Reticulocytes     Status: Abnormal   Collection Time: 03/08/17  2:35 AM  Result Value Ref Range   Retic Ct Pct 1.1 0.4 - 3.1 %   RBC. 3.14 (L) 4.22 - 5.81 MIL/uL   Retic Count, Absolute 34.5 19.0 - 186.0 K/uL    Comment: Performed at [facility] [address], [place], [place] [address]  Type and screen [place]     Status: None   Collection Time: 03/08/17  3:34 AM  Result Value Ref Range   ABO/RH(D) O NEG    Antibody Screen NEG    Sample Expiration      03/11/2017 Performed at [facility], [place] [address], [place], [place] [address]   Urinalysis, Routine w reflex microscopic     Status: Abnormal   Collection Time: 03/08/17  6:23 AM  Result Value Ref Range   Color, Urine YELLOW YELLOW   APPearance CLEAR CLEAR   Specific Gravity, Urine 1.011 1.005 - 1.030   pH 5.0 5.0 - 8.0   Glucose, UA 50 (A) NEGATIVE  mg/dL   Hgb urine dipstick NEGATIVE NEGATIVE   Bilirubin Urine NEGATIVE NEGATIVE   Ketones, ur NEGATIVE NEGATIVE mg/dL   Protein, ur NEGATIVE NEGATIVE mg/dL   Nitrite NEGATIVE NEGATIVE   Leukocytes, UA NEGATIVE NEGATIVE    Comment: Performed at [facility] [address], [place], [place] [address]  Sodium, urine, random     Status: None   Collection Time: 03/08/17  6:23 AM  Result Value Ref Range   Sodium, Ur 40 mmol/L    Comment: Performed at [facility] [address] [place], [place] [address]  Creatinine, urine, random     Status: None   Collection Time: 03/08/17  6:23 AM  Result Value Ref Range   Creatinine, Urine 71.61 mg/dL    Comment: Performed at [facility] [address], [place], [place] [address]  CBG monitoring, ED     Status: Abnormal   Collection Time: 03/08/17  7:34 AM  Result Value Ref Range   Glucose-Capillary 107 (H) 65 - 99 mg/dL   Comment 1 Notify RN    Comment 2 Document in Chart   Glucose, capillary     Status: Abnormal   Collection Time: 03/08/17 11:55 AM  Result Value Ref Range   Glucose-Capillary 244 (H) 65 - 99 mg/dL   US Renal  Result Date: 03/08/2017 CLINICAL DATA:  Acute onset of renal failure. Underlying chronic renal failure. EXAM: RENAL / URINARY TRACT ULTRASOUND COMPLETE COMPARISON:  Renal ultrasound performed 01/25/2016 FINDINGS: Right Kidney: Length: 11.0 cm. Echogenicity within normal limits. A 1.3 cm cyst is noted at the lower pole of the right kidney. No hydronephrosis visualized. Left Kidney: Length: 10.3 cm. Echogenicity within normal limits. No mass or hydronephrosis visualized. Bladder: Appears normal for degree of bladder distention. The prostate is borderline normal in size, though it impresses on the base of the bladder. IMPRESSION: 1. No evidence of hydronephrosis. 2. Small right renal cyst noted. Electronically Signed   By: Garald Balding M.D.   On: 03/08/2017 03:27    Review of Systems  Constitutional:  Negative for chills and fever.  Cardiovascular: Negative for chest pain and palpitations.  Gastrointestinal: Negative for abdominal pain, constipation, diarrhea, nausea and vomiting.  Genitourinary: Negative for dysuria and urgency.  Skin: Negative for rash.  Neurological: Negative for dizziness.      Blood pressure 120/71, pulse 91, temperature 97.7 F (36.5 C), temperature source Oral, resp. rate 14, height 6' (1.829 m), weight 170 lb (77.1 kg), SpO2 100 %. Physical Exam   GEN: NAD, rests comfortably in bed, pleasant and oriented RESPIRATORY: clear to auscultation bilaterally with no wheezes, rhonchi or rales, good effort CV: RRR, no m/r/g, no peripheral edema.  GI: Soft, non-tender, non-distended, normoactive bowel sounds, no hepatosplenomegaly SKIN: warm and dry, no rashes or lesions, +chronic skin changes over LE bil NEURO: II-XII grossly intact PSYCH: AAOx3, appropriate affect  Assessment/Plan 1. AKI on CKD IIIb - Cr elevated at 4.12 from BL 1.5-1.8 may be prerenal injury in the setting of patient doubling his lasix dose at home. Cr stable with IVF. UOP not recorded, however patient reports he is having a lot of urination.  - strict I/O - fluids at 75 ml/hr IVNS - monitor renal function with daily BMP - continue to hold home lasix and ACE in the setting of AKI  2. Normocytic anemia - may be in setting of CKD. Iron studies WNL  3. Chronic diastolic heart failure - no recent echo available in the chart. Patient euvolemic on exam, however may be a little dry. Fluids as noted above.  4. T2DM - managed per primary team. Not insulin-dependent.  Everrett Coombe, MD PGY2  Pt seen, examined and agree w A/P as above.  Kelly Splinter MD [employer] pager [phone]   03/08/2017, 2:54 PM

## 2017-03-09 LAB — BASIC METABOLIC PANEL
Anion gap: 13 (ref 5–15)
BUN: 77 mg/dL — AB (ref 6–20)
CO2: 15 mmol/L — ABNORMAL LOW (ref 22–32)
Calcium: 8.7 mg/dL — ABNORMAL LOW (ref 8.9–10.3)
Chloride: 113 mmol/L — ABNORMAL HIGH (ref 101–111)
Creatinine, Ser: 3.72 mg/dL — ABNORMAL HIGH (ref 0.61–1.24)
GFR calc Af Amer: 17 mL/min — ABNORMAL LOW (ref >60)
GFR, EST NON AFRICAN AMERICAN: 14 mL/min — AB (ref >60)
Glucose, Bld: 293 mg/dL — ABNORMAL HIGH (ref 65–99)
Potassium: 4.7 mmol/L (ref 3.5–5.1)
SODIUM: 141 mmol/L (ref 135–145)

## 2017-03-09 LAB — C4 COMPLEMENT: Complement C4, Body Fluid: 31 mg/dL (ref 14–44)

## 2017-03-09 LAB — GLUCOSE, CAPILLARY
GLUCOSE-CAPILLARY: 118 mg/dL — AB (ref 65–99)
Glucose-Capillary: 269 mg/dL — ABNORMAL HIGH (ref 65–99)
Glucose-Capillary: 174 mg/dL — ABNORMAL HIGH (ref 65–99)
Glucose-Capillary: 115 mg/dL — ABNORMAL HIGH (ref 65–99)

## 2017-03-09 LAB — UREA NITROGEN, URINE: UREA NITROGEN UR: 569 mg/dL

## 2017-03-09 LAB — C3 COMPLEMENT: C3 Complement: 88 mg/dL (ref 82–167)

## 2017-03-09 MED ORDER — ATORVASTATIN CALCIUM 10 MG PO TABS
10.0000 mg | ORAL_TABLET | Freq: Every day | ORAL | Status: DC
  Administered 2017-03-09: 10 mg via ORAL
  Filled 2017-03-09 (×2): qty 1

## 2017-03-09 MED ORDER — SODIUM CHLORIDE 0.9 % IV SOLN: INTRAVENOUS | Status: DC

## 2017-03-09 MED ORDER — METOPROLOL TARTRATE 12.5 MG HALF TABLET
12.5000 mg | ORAL_TABLET | Freq: Two times a day (BID) | ORAL | Status: DC
  Administered 2017-03-09 – 2017-03-11 (×3): 12.5 mg via ORAL
  Filled 2017-03-09 (×4): qty 1

## 2017-03-09 MED ORDER — SODIUM CHLORIDE 0.9 % IV SOLN
INTRAVENOUS | Status: DC
  Administered 2017-03-09: 11:00:00 via INTRAVENOUS
  Administered 2017-03-09: 1 mL via INTRAVENOUS

## 2017-03-09 NOTE — Progress Notes (Signed)
[facility] Progress Note    Assessment/ Plan:   1. AKI on CKD IIIb - Cr elevated at 4.12 from BL 1.5-1.8 may be prerenal injury in the setting of patient doubling his lasix dose at home. Cr stable with IVF. UOP 550 ml recorded. AM labs pending - strict I/O - fluids at 75 ml/hr IVNS, not currenlty running when I am in the room - monitor renal function with daily BMP - continue to hold home lasix and ACE in the setting of AKI  2. Normocytic anemia - may be in setting of CKD. Iron studies WNL  3. Chronic diastolic heart failure - no recent echo available in the chart. Patient euvolemic on exam, however may be a little dry. Fluids as noted above.  4. T2DM - managed per primary team. Not insulin-dependent.  Everrett Coombe, MD PGY2 03/09/2017, 10:18 AM   Pt seen, examined, agree w assess/plan as above with additions as indicated.  Creat down as expected w/ holding lasix and giving IVF"s.  Pt was self-treating at home by doubling his lasix dose for 8-9 days prior to admission. This worked to get rid of edema but also causing acute on chronic renal failure.  Cont to hold diuretics, cont IVF's, check creat in am. Kelly Splinter MD [facility] pager [phone]    cell [phone] 03/09/2017, 12:13 PM      Subjective:   Patient would like to know when he can go home. He denies any acute events overnight, states he is feeling fine this morning.   Objective:   BP (!) 124/51 (BP Location: Right Arm)   Pulse 67   Temp 98.8 F (37.1 C) (Oral)   Resp 15   Ht 6' (1.829 m)   Wt 167 lb 15.9 oz (76.2 kg)   SpO2 99%   BMI 22.78 kg/m   Intake/Output Summary (Last 24 hours) at 03/09/2017 1018 Last data filed at 03/09/2017 4097 Gross per 24 hour  Intake 912 ml  Output 750 ml  Net 162 ml   Weight change: 0 lb (0 kg)  Physical Exam: Gen:NAD, rests in bed CVS:RRR, no m/r/g Resp:CTA bil, No W/R/R DZH:GDJM, nt EQA:STMHDQQ skin changes, no LE edema  Imaging: US  Renal  Result Date: 03/08/2017 CLINICAL DATA:  Acute onset of renal failure. Underlying chronic renal failure. EXAM: RENAL / URINARY TRACT ULTRASOUND COMPLETE COMPARISON:  Renal ultrasound performed 01/25/2016 FINDINGS: Right Kidney: Length: 11.0 cm. Echogenicity within normal limits. A 1.3 cm cyst is noted at the lower pole of the right kidney. No hydronephrosis visualized. Left Kidney: Length: 10.3 cm. Echogenicity within normal limits. No mass or hydronephrosis visualized. Bladder: Appears normal for degree of bladder distention. The prostate is borderline normal in size, though it impresses on the base of the bladder. IMPRESSION: 1. No evidence of hydronephrosis. 2. Small right renal cyst noted. Electronically Signed   By: Garald Balding M.D.   On: 03/08/2017 03:27    Labs: BMET Recent Labs  Lab 03/07/17 1903 03/08/17 0235  NA 138 138  K 4.8 5.1  CL 106 106  CO2 19* 18*  GLUCOSE 244* 140*  BUN 93* 95*  CREATININE 4.25* 4.12*  CALCIUM 9.4 9.3   CBC Recent Labs  Lab 03/07/17 1903  WBC 7.9  HGB 9.5*  HCT 29.1*  MCV 97.7  PLT 183    Medications:    . clopidogrel  75 mg Oral Daily  . gabapentin  400 mg Oral q morning - 10a  . heparin  5,000 Units Subcutaneous Q8H  . insulin aspart  0-5 Units Subcutaneous QHS  . insulin aspart  0-9 Units Subcutaneous TID WC

## 2017-03-09 NOTE — Progress Notes (Addendum)
Patient Demographics:    Marc [name], is a 77 y.o. male, DOB - Aug 24, 1940, KWI:[id number]  Admit date - 03/08/2017   Admitting Physician Vianne Bulls, MD  Outpatient Primary MD for the patient is [name], MD  LOS - 1   Chief Complaint  Patient presents with  . Abnormal Lab        Subjective:    Marc [name] today has no fevers, no emesis,  No chest pain, voiding okay, no dysuria no hematuria, no flank pain  Assessment  & Plan :    Principal Problem:   Acute renal failure superimposed on stage 3 chronic kidney disease (HCC) Active Problems:   Type 2 diabetes mellitus with peripheral neuropathy (HCC)   Chronic kidney disease stage 3   CAD (coronary artery disease), native coronary artery   Chronic diastolic CHF (congestive heart failure) (HCC)   Chronic pain  Brief Summary:- 77 yo male with CKD III admitted on 03/08/17 with syncope related to hypoglycemia and finding of Aki on CKD III after doubling his lasix dose from 80 mg to 160 mg daily w/o physician orders.  Baseline creatinine around 1.5 to 1.8 , creatinine on admission 4.25    Plan 1)AKI on CKD III -improving, creatinine is down to 3.7 from 4.25 on admission, Baseline creatinine around 1.5 to 1.8 , suspect prerenal azotemia due to over aggressive diuresis compounded by ACE inhibitor use, continue gentle hydration with normal saline at 75 mL an hour, , continue to hold lisinopril and Lasix.  Nephrology input appreciated  2)Acute on chronic Anemia-hemoglobin is above 9, previous baseline from August 2018 was 60, no evidence of ongoing bleeding, anemia workup pending.  Suspect some degree of chronic anemia of CKD, patient is normocytic  3)HFpEF-patient has history of chronic diastolic dysfunction CHF , clinically appears dry, gentle hydration as above in #1, HOLD Lasix and lisinopril   until patient is euvolemic and renal  function improves, last known EF over 55% based on gated images  4)DM-last known A1c 6.4 from August 2018, glimepiride on hold, Use Novolog/Humalog Sliding scale insulin with Accu-Cheks/Fingersticks as ordered   5) chronic pain syndrome-okay to continue home regimen  6)H/o CAD-no ACS type symptoms, Plavix, metoprolol and Lipitor as prescribed  Code Status : DNR   Disposition Plan  : home  Consults  :  Nephrology   DVT Prophylaxis  : - Heparin    Lab Results  Component Value Date   PLT 183 03/07/2017    Inpatient Medications  Scheduled Meds: . clopidogrel  75 mg Oral Daily  . gabapentin  400 mg Oral q morning - 10a  . heparin  5,000 Units Subcutaneous Q8H  . insulin aspart  0-5 Units Subcutaneous QHS  . insulin aspart  0-9 Units Subcutaneous TID WC   Continuous Infusions: . sodium chloride 75 mL/hr at 03/09/17 1118   PRN Meds:.acetaminophen **OR** acetaminophen, bisacodyl, ondansetron **OR** ondansetron (ZOFRAN) IV, oxyCODONE, senna-docusate    Anti-infectives (From admission, onward)   None        Objective:   Vitals:   03/08/17 0802 03/08/17 1448 03/08/17 2116 03/09/17 0448  BP: 120/71 131/65 (!) 116/56 (!) 124/51  Pulse: 91 66 (!) 54 67  Resp: 14 15 15  15  Temp: 97.7 F (36.5 C) 98.4 F (36.9 C) 98.5 F (36.9 C) 98.8 F (37.1 C)  TempSrc: Oral Oral Oral Oral  SpO2: 100% 100% 94% 99%  Weight: 77.1 kg (170 lb)   76.2 kg (167 lb 15.9 oz)  Height: 6' (1.829 m)       Wt Readings from Last 3 Encounters:  03/09/17 76.2 kg (167 lb 15.9 oz)  02/12/17 78.9 kg (174 lb)  09/18/16 77.1 kg (170 lb)     Intake/Output Summary (Last 24 hours) at 03/09/2017 1746 Last data filed at 03/09/2017 1114 Gross per 24 hour  Intake 892 ml  Output 550 ml  Net 342 ml     Physical Exam  Gen:- Awake Alert,  In no apparent distress  HEENT:- [place].AT, No sclera icterus Neck-Supple Neck,No JVD,.  Lungs-  CTAB  CV- S1, S2 normal Abd-  +ve B.Sounds, Abd Soft, No  tenderness,    Extremity/Skin:- No  edema,    Neuro-no tremors, no new focal deficits Psych-affect is appropriate    Data Review:   Micro Results No results found for this or any previous visit (from the past 240 hour(s)).  Radiology Reports US Renal  Result Date: 03/08/2017 CLINICAL DATA:  Acute onset of renal failure. Underlying chronic renal failure. EXAM: RENAL / URINARY TRACT ULTRASOUND COMPLETE COMPARISON:  Renal ultrasound performed 01/25/2016 FINDINGS: Right Kidney: Length: 11.0 cm. Echogenicity within normal limits. A 1.3 cm cyst is noted at the lower pole of the right kidney. No hydronephrosis visualized. Left Kidney: Length: 10.3 cm. Echogenicity within normal limits. No mass or hydronephrosis visualized. Bladder: Appears normal for degree of bladder distention. The prostate is borderline normal in size, though it impresses on the base of the bladder. IMPRESSION: 1. No evidence of hydronephrosis. 2. Small right renal cyst noted. Electronically Signed   By: Garald Balding M.D.   On: 03/08/2017 03:27     CBC Recent Labs  Lab 03/07/17 1903  WBC 7.9  HGB 9.5*  HCT 29.1*  PLT 183  MCV 97.7  MCH 31.9  MCHC 32.6  RDW 14.6    Chemistries  Recent Labs  Lab 03/07/17 1903 03/08/17 0235 03/09/17 1028  NA 138 138 141  K 4.8 5.1 4.7  CL 106 106 113*  CO2 19* 18* 15*  GLUCOSE 244* 140* 293*  BUN 93* 95* 77*  CREATININE 4.25* 4.12* 3.72*  CALCIUM 9.4 9.3 8.7*   ------------------------------------------------------------------------------------------------------------------ No results for input(s): CHOL, HDL, LDLCALC, TRIG, CHOLHDL, LDLDIRECT in the last 72 hours.  Lab Results  Component Value Date   HGBA1C 6.4 (H) 09/18/2016   ------------------------------------------------------------------------------------------------------------------ No results for input(s): TSH, T4TOTAL, T3FREE, THYROIDAB in the last 72 hours.  Invalid input(s):  FREET3 ------------------------------------------------------------------------------------------------------------------ Recent Labs    03/08/17 0235  VITAMINB12 590  FOLATE 32.0  FERRITIN 420*  TIBC 242*  IRON 49  RETICCTPCT 1.1    Coagulation profile No results for input(s): INR, PROTIME in the last 168 hours.  No results for input(s): DDIMER in the last 72 hours.  Cardiac Enzymes No results for input(s): CKMB, TROPONINI, MYOGLOBIN in the last 168 hours.  Invalid input(s): CK ------------------------------------------------------------------------------------------------------------------    Component Value Date/Time   BNP 41.6 01/02/2016 1725     Samir Ishaq M.D on 03/09/2017 at 5:46 PM  Between 7am to 7pm - Pager - [phone]  After 7pm go to [email or web address] - password [email or web address]  [facility] -  Office  [phone]   Voice Recognition Viviann Spare dictation system was used to create this  note, attempts have been made to correct errors. Please contact the author with questions and/or clarifications.

## 2017-03-09 NOTE — Progress Notes (Deleted)
Notified dialysis RN that treatment has been completed.

## 2017-03-10 LAB — BASIC METABOLIC PANEL
Anion gap: 10 (ref 5–15)
BUN: 64 mg/dL — ABNORMAL HIGH (ref 6–20)
CALCIUM: 8.9 mg/dL (ref 8.9–10.3)
CO2: 15 mmol/L — ABNORMAL LOW (ref 22–32)
CREATININE: 3.33 mg/dL — AB (ref 0.61–1.24)
Chloride: 118 mmol/L — ABNORMAL HIGH (ref 101–111)
GFR, EST AFRICAN AMERICAN: 19 mL/min — AB (ref >60)
GFR, EST NON AFRICAN AMERICAN: 16 mL/min — AB (ref >60)
Glucose, Bld: 115 mg/dL — ABNORMAL HIGH (ref 65–99)
Potassium: 5 mmol/L (ref 3.5–5.1)
Sodium: 143 mmol/L (ref 135–145)

## 2017-03-10 LAB — CBC
HCT: 25.8 % — ABNORMAL LOW (ref 39.0–52.0)
Hemoglobin: 8.3 g/dL — ABNORMAL LOW (ref 13.0–17.0)
MCH: 32.4 pg (ref 26.0–34.0)
MCHC: 32.2 g/dL (ref 30.0–36.0)
MCV: 100.8 fL — ABNORMAL HIGH (ref 78.0–100.0)
Platelets: 149 10*3/uL — ABNORMAL LOW (ref 150–400)
RBC: 2.56 MIL/uL — ABNORMAL LOW (ref 4.22–5.81)
RDW: 15.2 % (ref 11.5–15.5)
WBC: 6.3 10*3/uL (ref 4.0–10.5)

## 2017-03-10 LAB — GLUCOSE, CAPILLARY
Glucose-Capillary: 95 mg/dL (ref 65–99)
Glucose-Capillary: 114 mg/dL — ABNORMAL HIGH (ref 65–99)
Glucose-Capillary: 167 mg/dL — ABNORMAL HIGH (ref 65–99)
Glucose-Capillary: 113 mg/dL — ABNORMAL HIGH (ref 65–99)

## 2017-03-10 MED ORDER — GABAPENTIN 600 MG PO TABS
1200.0000 mg | ORAL_TABLET | Freq: Two times a day (BID) | ORAL | Status: DC
  Administered 2017-03-10 – 2017-03-11 (×2): 1200 mg via ORAL
  Filled 2017-03-10 (×2): qty 2

## 2017-03-10 MED ORDER — SODIUM CHLORIDE 0.9 % IV SOLN
INTRAVENOUS | Status: DC
  Administered 2017-03-10 (×2): via INTRAVENOUS

## 2017-03-10 NOTE — Progress Notes (Signed)
Patient upset about his diet, having to have lab work, and people constantly coming into his room.  Patient has cussed at nurse twice this morning and told nurse to put a "Do not Disturb" sign on his door.  Nurse explained that everything is scheduled.  Patient says to group meds together.  Nurse will try to acomodate this request as often as possible.  Will continue to monitor.

## 2017-03-10 NOTE — Progress Notes (Signed)
Patient B/P 112/48.  No dizziness or lightheaded or nausea symptoms present.  MD notified.  Will continue to monitor.

## 2017-03-10 NOTE — Progress Notes (Signed)
Patient Demographics:    Marc [name], is a 77 y.o. male, DOB - [birth year], WEX:[id number]  Admit date - 03/08/2017   Admitting Physician Vianne Bulls, MD  Outpatient Primary MD for the patient is [name], MD  LOS - 2   Chief Complaint  Patient presents with  . Abnormal Lab        Subjective:    Marc [name] apparently was abusive to lab staff when they came to draw labs this morning.  However upon my visit, patient was pleasant and cooperative.  States that he was upset because he is a hard stick.  Denies dyspnea, chest pain, dizziness, lightheadedness.  Reports urinating well.  States that he lives with family.  Denies falls but indicates that he "bumps into stuff" which has caused bruising/discoloration of his upper extremities.   Assessment  & Plan :    Principal Problem:   Acute renal failure superimposed on stage 3 chronic kidney disease (HCC) Active Problems:   Type 2 diabetes mellitus with peripheral neuropathy (HCC)   Chronic kidney disease stage 3   CAD (coronary artery disease), native coronary artery   Chronic diastolic CHF (congestive heart failure) (HCC)   Chronic pain  Brief Summary:- 77 yo male with CKD III admitted on 03/08/17 with syncope related to hypoglycemia and finding of Aki on CKD III after doubling his lasix dose from 80 mg to 160 mg daily w/o physician orders.  Baseline creatinine around 1.5 to 1.8 , creatinine on admission 4.25    Plan 1)AKI on CKD III -improving, creatinine is down to 3.33 from 4.25 on admission, Baseline creatinine around 1.5 to 1.8 , suspect prerenal azotemia due to over aggressive diuresis compounded by ACE inhibitor use. Continue gentle hydration with normal saline at 75 mL an hour & continue to hold lisinopril and Lasix.  Does not appear volume overloaded.  Nephrology follow-up appreciated.  Follow BMP in a.m.  Renal ultrasound  without hydronephrosis.  Patient has bicarbonate of 15 for the last 2 days/NAG metabolic acidosis.  Some of this may also be due to hyperchloremia.?  Change to bicarbonate drip, defer to Nephrology.  2)Acute on chronic Anemia-baseline hemoglobin not known.  He presented with hemoglobin 14 which was likely due to hemoconcentration.  This has gradually dropped to 8.3 in the absence of overt bleeding.  Baseline hemoglobin may be in the 10 g range.  Anemia panel results reviewed.  Possibly due to chronic disease and chronic kidney disease.  Follow CBC in a.m.  Transfuse if hemoglobin <7 g per DL.  3)HFpEF-patient has history of chronic diastolic dysfunction CHF , clinically appears dry, gentle hydration as above in #1, HOLD Lasix and lisinopril until patient is euvolemic and renal function improves, last known EF over 55% based on gated images.  Stable.  4)DM-last known A1c 6.4 from August 2018, glimepiride on hold, Use Novolog/Humalog Sliding scale insulin with Accu-Cheks/Fingersticks as ordered.  Reasonable inpatient control.   5) chronic pain syndrome-okay to continue home regimen.  Pain controlled.  6)H/o CAD-no ACS type symptoms, Plavix, metoprolol and Lipitor as prescribed    Code Status : DNR Disposition Plan  : DC home pending clinical improvement, may take additional 1 or 2 days.  Given history of "bumping  into stuff", requested PT evaluation.  Consults  :  Nephrology   DVT Prophylaxis  : - Heparin    Inpatient Medications  Scheduled Meds: . atorvastatin  10 mg Oral q1800  . clopidogrel  75 mg Oral Daily  . gabapentin  400 mg Oral q morning - 10a  . heparin  5,000 Units Subcutaneous Q8H  . insulin aspart  0-5 Units Subcutaneous QHS  . insulin aspart  0-9 Units Subcutaneous TID WC  . metoprolol tartrate  12.5 mg Oral BID     Anti-infectives (From admission, onward)   None        Objective:   Vitals:   03/08/17 2116 03/09/17 0448 03/09/17 2127 03/10/17 1327  BP: (!)  116/56 (!) 124/51 (!) 116/57 (!) 128/48  Pulse: (!) 54 67 (!) 47 (!) 50  Resp: 15 15 17 17   Temp: 98.5 F (36.9 C) 98.8 F (37.1 C) 98.6 F (37 C) 97.8 F (36.6 C)  TempSrc: Oral Oral Oral Oral  SpO2: 94% 99% 100% 100%  Weight:  76.2 kg (167 lb 15.9 oz)    Height:        Wt Readings from Last 3 Encounters:  03/09/17 76.2 kg (167 lb 15.9 oz)  02/12/17 78.9 kg (174 lb)  09/18/16 77.1 kg (170 lb)     Intake/Output Summary (Last 24 hours) at 03/10/2017 1346 Last data filed at 03/10/2017 1328 Gross per 24 hour  Intake 1391.25 ml  Output 680 ml  Net 711.25 ml     Physical Exam  Gen:-Pleasant elderly male, moderately built and nourished, lying comfortably supine in bed.  Oral mucosa: Borderline hydration. Lungs-clear to auscultation.  No increased work of breathing. CV-S1 and S2 heard, RRR.  No JVD, murmurs or pedal edema. Abd-nondistended, soft and nontender.  Normal bowel sounds heard. Extremity/Skin:-Extensive bruising/darkened discoloration of skin of his bilateral upper extremities.  Superficial abrasion just above the medial aspect of left ankle.  Amputated right first and second toes.  Diminished sensations in both lower extremity related to chronic peripheral neuropathy. Neuro-alert and oriented x3.  No focal neurological deficits. Psych-affect is appropriate    Data Review:    Radiology Reports US Renal  Result Date: 03/08/2017 CLINICAL DATA:  Acute onset of renal failure. Underlying chronic renal failure. EXAM: RENAL / URINARY TRACT ULTRASOUND COMPLETE COMPARISON:  Renal ultrasound performed 01/25/2016 FINDINGS: Right Kidney: Length: 11.0 cm. Echogenicity within normal limits. A 1.3 cm cyst is noted at the lower pole of the right kidney. No hydronephrosis visualized. Left Kidney: Length: 10.3 cm. Echogenicity within normal limits. No mass or hydronephrosis visualized. Bladder: Appears normal for degree of bladder distention. The prostate is borderline normal in size,  though it impresses on the base of the bladder. IMPRESSION: 1. No evidence of hydronephrosis. 2. Small right renal cyst noted. Electronically Signed   By: Garald Balding M.D.   On: 03/08/2017 03:27     CBC Recent Labs  Lab 03/07/17 1903 03/10/17 0816  WBC 7.9 6.3  HGB 9.5* 8.3*  HCT 29.1* 25.8*  PLT 183 149*  MCV 97.7 100.8*  MCH 31.9 32.4  MCHC 32.6 32.2  RDW 14.6 15.2    Chemistries  Recent Labs  Lab 03/07/17 1903 03/08/17 0235 03/09/17 1028 03/10/17 0816  NA 138 138 141 143  K 4.8 5.1 4.7 5.0  CL 106 106 113* 118*  CO2 19* 18* 15* 15*  GLUCOSE 244* 140* 293* 115*  BUN 93* 95* 77* 64*  CREATININE 4.25* 4.12* 3.72* 3.33*  CALCIUM 9.4 9.3 8.7* 8.9     Lab Results  Component Value Date   HGBA1C 6.4 (H) 09/18/2016    Recent Labs    03/08/17 0235  VITAMINB12 590  FOLATE 32.0  FERRITIN 420*  TIBC 242*  IRON 49  RETICCTPCT 1.1    ------------------------------------------------------------------------------------------------------------------    Component Value Date/Time   BNP 41.6 01/02/2016 1725     Vernell Leep, MD, FACP, [facility]. [facility] Pager [phone]  If 7PM-7AM, please contact night-coverage [email or web address] Password [email or web address] 03/10/2017, 1:58 PM

## 2017-03-10 NOTE — Progress Notes (Signed)
  [name] Progress Note    Assessment/ Plan:   1. AKI on CKD IIIb - in the setting of doubling his lasix at home, admission Cr elevated at 4.12 from BL 1.5-1.8 Cr improved at 3.33 from 3.72  UOP 880 ml recorded.  - strict I/O - fluids at 75 ml/hr IVNS - monitor renal function with daily BMP -  hold home lasix and ACE in the setting of AKI  2. Normocytic anemia - may be in setting of CKD. Iron studies WNL  3. Chronic diastolic heart failure - no recent echo available in the chart. Patient euvolemic on exam, however may be a little dry. Fluids as noted above.  4. T2DM - managed per primary team. Not insulin-dependent.   Subjective:   Patient denies symptoms this morning. Does report he would like to be discharged. He reports that he has a renal appointment with Dr. Florene Glen on 2/14   Objective:   BP (!) 116/57 (BP Location: Right Arm)   Pulse (!) 47   Temp 98.6 F (37 C) (Oral)   Resp 17   Ht 6' (1.829 m)   Wt 167 lb 15.9 oz (76.2 kg)   SpO2 100%   BMI 22.78 kg/m   Intake/Output Summary (Last 24 hours) at 03/10/2017 1158 Last data filed at 03/10/2017 0934 Gross per 24 hour  Intake 1151.25 ml  Output 680 ml  Net 471.25 ml   Weight change:   Physical Exam:  Gen:NAD, rests comfortably in bed CVS:RRR, no m/r/g Resp:CTA bil, No W/R/R PPI:RJJO, nt ACZ:YSAYTKZ skin changes, no LE edema  Imaging: No results found.  Labs: BMET Recent Labs  Lab 03/07/17 1903 03/08/17 0235 03/09/17 1028 03/10/17 0816  NA 138 138 141 143  K 4.8 5.1 4.7 5.0  CL 106 106 113* 118*  CO2 19* 18* 15* 15*  GLUCOSE 244* 140* 293* 115*  BUN 93* 95* 77* 64*  CREATININE 4.25* 4.12* 3.72* 3.33*  CALCIUM 9.4 9.3 8.7* 8.9   CBC Recent Labs  Lab 03/07/17 1903 03/10/17 0816  WBC 7.9 6.3  HGB 9.5* 8.3*  HCT 29.1* 25.8*  MCV 97.7 100.8*  PLT 183 149*    Medications:    . atorvastatin  10 mg Oral q1800  . clopidogrel  75 mg Oral Daily  . gabapentin  400 mg Oral q  morning - 10a  . heparin  5,000 Units Subcutaneous Q8H  . insulin aspart  0-5 Units Subcutaneous QHS  . insulin aspart  0-9 Units Subcutaneous TID WC  . metoprolol tartrate  12.5 mg Oral BID    I have seen and examined this patient and agree with plan and assessment in the above note with renal recommendations/intervention highlighted.  He remains firm that he does not want to pursue any form of RRT and understands the consequences.  He is followed by Dr. Florene Glen and has an appointment scheduled 03/13/17.  He was urged to keep this appointment if he is discharged in time or to reschedule f/u as we can help manage his anemia and follow his renal function more closely.   [name] A Ariyana Faw,MD 03/10/2017 2:02 PM

## 2017-03-11 DIAGNOSIS — I5032 Chronic diastolic (congestive) heart failure: Secondary | ICD-10-CM

## 2017-03-11 DIAGNOSIS — N170 Acute kidney failure with tubular necrosis: Secondary | ICD-10-CM

## 2017-03-11 DIAGNOSIS — N179 Acute kidney failure, unspecified: Principal | ICD-10-CM

## 2017-03-11 DIAGNOSIS — I2510 Atherosclerotic heart disease of native coronary artery without angina pectoris: Secondary | ICD-10-CM

## 2017-03-11 DIAGNOSIS — N183 Chronic kidney disease, stage 3 (moderate): Secondary | ICD-10-CM

## 2017-03-11 LAB — CBC
HCT: 22.1 % — ABNORMAL LOW (ref 39.0–52.0)
Hemoglobin: 7.1 g/dL — ABNORMAL LOW (ref 13.0–17.0)
MCH: 32 pg (ref 26.0–34.0)
MCHC: 32.1 g/dL (ref 30.0–36.0)
MCV: 99.5 fL (ref 78.0–100.0)
Platelets: 133 10*3/uL — ABNORMAL LOW (ref 150–400)
RBC: 2.22 MIL/uL — AB (ref 4.22–5.81)
RDW: 15.1 % (ref 11.5–15.5)
WBC: 4.7 10*3/uL (ref 4.0–10.5)

## 2017-03-11 LAB — RENAL FUNCTION PANEL
ALBUMIN: 2.5 g/dL — AB (ref 3.5–5.0)
ANION GAP: 10 (ref 5–15)
BUN: 54 mg/dL — ABNORMAL HIGH (ref 6–20)
CHLORIDE: 122 mmol/L — AB (ref 101–111)
CO2: 14 mmol/L — ABNORMAL LOW (ref 22–32)
Calcium: 8.5 mg/dL — ABNORMAL LOW (ref 8.9–10.3)
Creatinine, Ser: 2.89 mg/dL — ABNORMAL HIGH (ref 0.61–1.24)
GFR, EST AFRICAN AMERICAN: 23 mL/min — AB (ref >60)
GFR, EST NON AFRICAN AMERICAN: 20 mL/min — AB (ref >60)
Glucose, Bld: 90 mg/dL (ref 65–99)
PHOSPHORUS: 3.3 mg/dL (ref 2.5–4.6)
POTASSIUM: 4.6 mmol/L (ref 3.5–5.1)
Sodium: 146 mmol/L — ABNORMAL HIGH (ref 135–145)

## 2017-03-11 LAB — GLUCOSE, CAPILLARY
GLUCOSE-CAPILLARY: 105 mg/dL — AB (ref 65–99)
Glucose-Capillary: 89 mg/dL (ref 65–99)

## 2017-03-11 MED ORDER — SODIUM BICARBONATE 650 MG PO TABS: 1300.0000 mg | ORAL_TABLET | Freq: Two times a day (BID) | ORAL | Status: DC

## 2017-03-11 MED ORDER — SENNOSIDES-DOCUSATE SODIUM 8.6-50 MG PO TABS: 1.0000 | ORAL_TABLET | Freq: Every evening | ORAL | 1 refills | Status: AC | PRN

## 2017-03-11 MED ORDER — FUROSEMIDE 80 MG PO TABS: 80.0000 mg | ORAL_TABLET | Freq: Every day | ORAL | 0 refills | Status: DC

## 2017-03-11 MED ORDER — SODIUM BICARBONATE 650 MG PO TABS: 1300.0000 mg | ORAL_TABLET | Freq: Two times a day (BID) | ORAL | 1 refills | Status: DC

## 2017-03-11 MED ORDER — LISINOPRIL 20 MG PO TABS: 20.0000 mg | ORAL_TABLET | Freq: Every day | ORAL | 1 refills | Status: DC

## 2017-03-11 MED ORDER — METOPROLOL TARTRATE 25 MG PO TABS: 12.5000 mg | ORAL_TABLET | Freq: Two times a day (BID) | ORAL | 1 refills | Status: AC

## 2017-03-11 MED ORDER — ACETAMINOPHEN 325 MG PO TABS: 650.0000 mg | ORAL_TABLET | Freq: Four times a day (QID) | ORAL | 1 refills | Status: DC | PRN

## 2017-03-11 NOTE — Progress Notes (Signed)
  [facility] Progress Note    Assessment/ Plan:   1. AKI on CKD IIIb - in the setting of doubling his lasix at home, admission Cr elevated at 4.12 from BL 1.5-1.8 Cr improved at 2.89 from 3.33  UOP 1.7L recorded.  - strict I/O - fluids at 75 ml/hr IVNS discontinued - started sodium bicarb tab 1300 BID which may be continued at discharge, plan for follow up labs at 2/14 renal appt with Dr. Florene Glen  2. Normocytic anemia - may be in setting of CKD. Iron studies WNL  3. Chronic diastolic heart failure - no recent echo available in the chart. Patient euvolemic on exam, however may be a little dry. Fluids as noted above.  4. T2DM - managed per primary team. Not insulin-dependent.   Subjective:   No acute events overnight. Patient denies LE edema, dyspnea this AM.   Objective:   BP (!) 133/54 (BP Location: Right Arm)   Pulse (!) 45   Temp 98.3 F (36.8 C) (Oral)   Resp 17   Ht 6' (1.829 m)   Wt 167 lb 15.9 oz (76.2 kg)   SpO2 100%   BMI 22.78 kg/m   Intake/Output Summary (Last 24 hours) at 03/11/2017 1145 Last data filed at 03/11/2017 0520 Gross per 24 hour  Intake 1912.5 ml  Output 1750 ml  Net 162.5 ml   Weight change:   Physical Exam:  Gen:NAD, rests comfortably in bed CVS:RRR, no m/r/g Resp:CTA bil, No W/R/R MHD:QQIW, nt LNL:GXQJJHE skin changes, no LE edema  Imaging: No results found.  Labs: BMET Recent Labs  Lab 03/07/17 1903 03/08/17 0235 03/09/17 1028 03/10/17 0816 03/11/17 0604  NA 138 138 141 143 146*  K 4.8 5.1 4.7 5.0 4.6  CL 106 106 113* 118* 122*  CO2 19* 18* 15* 15* 14*  GLUCOSE 244* 140* 293* 115* 90  BUN 93* 95* 77* 64* 54*  CREATININE 4.25* 4.12* 3.72* 3.33* 2.89*  CALCIUM 9.4 9.3 8.7* 8.9 8.5*  PHOS  --   --   --   --  3.3   CBC Recent Labs  Lab 03/07/17 1903 03/10/17 0816 03/11/17 0604  WBC 7.9 6.3 4.7  HGB 9.5* 8.3* 7.1*  HCT 29.1* 25.8* 22.1*  MCV 97.7 100.8* 99.5  PLT 183 149* 133*    Medications:     . atorvastatin  10 mg Oral q1800  . clopidogrel  75 mg Oral Daily  . gabapentin  1,200 mg Oral BID  . heparin  5,000 Units Subcutaneous Q8H  . insulin aspart  0-5 Units Subcutaneous QHS  . insulin aspart  0-9 Units Subcutaneous TID WC  . metoprolol tartrate  12.5 mg Oral BID  . sodium bicarbonate  1,300 mg Oral BID   Everrett Coombe, MD PGY2  I have seen and examined this patient and agree with plan and assessment in the above note with renal recommendations/intervention highlighted.  Pt is to follow up with Dr. Florene Glen in our office in 2 days. [name] A Eilam [place],MD 03/11/2017 12:15 PM

## 2017-03-11 NOTE — Progress Notes (Signed)
PT Cancellation Note  Patient Details Name: Marc [name] MRN: [id number] DOB: [birth year]   Cancelled Treatment:    Reason Eval/Treat Not Completed: Other (comment) Pt adamantly refusing PT prior to going home and reports he does not need PT. Will sign off. If needs change, please re-consult.   Leighton Ruff, PT, DPT  Acute Rehabilitation Services  Pager: [phone]  Rudean Hitt 03/11/2017, 12:06 PM

## 2017-03-11 NOTE — Discharge Summary (Addendum)
Physician Discharge Summary  Marc [name] MRN: [id number] DOB/AGE: 1940-08-31 77 y.o.  PCP: [name], MD   Admit date: 03/08/2017 Discharge date: 03/11/2017  Discharge Diagnoses:    Principal Problem:   Acute renal failure superimposed on stage 3 chronic kidney disease ([place]) Active Problems:   Type 2 diabetes mellitus with peripheral neuropathy (HCC)   Chronic kidney disease stage 3   CAD (coronary artery disease), native coronary artery   Chronic diastolic CHF (congestive heart failure) (HCC)   Chronic pain    Follow-up recommendations Follow-up with PCP in 3-5 days , including all  additional recommended appointments as below Patient strongly recommended to follow-up with PCP in the next 2-3 days for repeat BMP   follow up labs at 2/14 renal appt with Dr. Florene Glen Request pcp  To evaluate his anemia Patient reluctant to continue staying  today for inpatient evaluation of his anemia  Patient states that Dr Arelia Sneddon dc'd his ASA BID        Allergies as of 03/11/2017      Reactions   Actos [pioglitazone] Swelling, Other (See Comments)   LOWER EXTREMITY EDEMA      Medication List    TAKE these medications   allopurinol 300 MG tablet Commonly known as:  ZYLOPRIM Take 300 mg by mouth at bedtime.       clopidogrel 75 MG tablet Commonly known as:  PLAVIX Take 75 mg by mouth daily.   colchicine 0.6 MG tablet Take 0.6 mg by mouth daily.   docusate sodium 100 MG capsule Commonly known as:  COLACE Take 100 mg by mouth 2 (two) times daily.   furosemide 80 MG tablet Commonly known as:  LASIX Take 1 tablet (80 mg total) by mouth daily for 5 days. Start taking on:  03/25/2017 What changed:  These instructions start on 03/25/2017. If you are unsure what to do until then, ask your doctor or other care provider.   gabapentin 600 MG tablet Commonly known as:  NEURONTIN Take 1,200 mg by mouth every 12 (twelve) hours.   glimepiride 4 MG tablet Commonly known  as:  AMARYL Take 8 mg by mouth daily with breakfast.   lisinopril 20 MG tablet Commonly known as:  PRINIVIL,ZESTRIL Take 1 tablet (20 mg total) by mouth daily. Start taking on:  03/25/2017 What changed:  These instructions start on 03/25/2017. If you are unsure what to do until then, ask your doctor or other care provider.   metoprolol tartrate 25 MG tablet Commonly known as:  LOPRESSOR Take 0.5 tablets (12.5 mg total) by mouth 2 (two) times daily.   multivitamin-prenatal 27-0.8 MG Tabs tablet Take 1 tablet by mouth daily at 12 noon.   oxyCODONE 15 MG immediate release tablet Commonly known as:  ROXICODONE Take 15 mg by mouth every 6 (six) hours as needed for pain.   pantoprazole 40 MG tablet Commonly known as:  PROTONIX Take 40 mg by mouth daily.   predniSONE 5 MG tablet Commonly known as:  DELTASONE Take 5 mg by mouth daily.   senna-docusate 8.6-50 MG tablet Commonly known as:  Senokot-S Take 1 tablet by mouth at bedtime as needed for mild constipation.   sodium bicarbonate 650 MG tablet Take 2 tablets (1,300 mg total) by mouth 2 (two) times daily.         Discharge Condition: * Stable   Discharge Instructions Get Medicines reviewed and adjusted: Please take all your medications with you for your next visit with your Primary MD  Please request your Primary MD to go over all hospital tests and procedure/radiological results at the follow up, please ask your Primary MD to get all Hospital records sent to his/her office.  If you experience worsening of your admission symptoms, develop shortness of breath, life threatening emergency, suicidal or homicidal thoughts you must seek medical attention immediately by calling 911 or calling your MD immediately if symptoms less severe.  You must read complete instructions/literature along with all the possible adverse reactions/side effects for all the Medicines you take and that have been prescribed to you. Take any new  Medicines after you have completely understood and accpet all the possible adverse reactions/side effects.   Do not drive when taking Pain medications.   Do not take more than prescribed Pain, Sleep and Anxiety Medications  Special Instructions: If you have smoked or chewed Tobacco in the last 2 yrs please stop smoking, stop any regular Alcohol and or any Recreational drug use.  Wear Seat belts while driving.  Please note  You were cared for by a hospitalist during your hospital stay. Once you are discharged, your primary care physician will handle any further medical issues. Please note that NO REFILLS for any discharge medications will be authorized once you are discharged, as it is imperative that you return to your primary care physician (or establish a relationship with a primary care physician if you do not have one) for your aftercare needs so that they can reassess your need for medications and monitor your lab values.     Allergies  Allergen Reactions  . Actos [Pioglitazone] Swelling and Other (See Comments)    LOWER EXTREMITY EDEMA       Disposition: 01-Home or Self Care   Consults:   Nephrology    Significant Diagnostic Studies:  US Renal  Result Date: 2017/03/12 CLINICAL DATA:  Acute onset of renal failure. Underlying chronic renal failure. EXAM: RENAL / URINARY TRACT ULTRASOUND COMPLETE COMPARISON:  Renal ultrasound performed 01/25/2016 FINDINGS: Right Kidney: Length: 11.0 cm. Echogenicity within normal limits. A 1.3 cm cyst is noted at the lower pole of the right kidney. No hydronephrosis visualized. Left Kidney: Length: 10.3 cm. Echogenicity within normal limits. No mass or hydronephrosis visualized. Bladder: Appears normal for degree of bladder distention. The prostate is borderline normal in size, though it impresses on the base of the bladder. IMPRESSION: 1. No evidence of hydronephrosis. 2. Small right renal cyst noted. Electronically Signed   By: Garald Balding M.D.   On: March 12, 2017 03:27        Filed Weights   2017-03-12 0116 March 12, 2017 0802 03/09/17 0448  Weight: 77.1 kg (170 lb) 77.1 kg (170 lb) 76.2 kg (167 lb 15.9 oz)     Microbiology: No results found for this or any previous visit (from the past 240 hour(s)).     Blood Culture    Component Value Date/Time   SDES FLUID RIGHT KNEE 04/14/2015 1014   SPECREQUEST NONE 04/14/2015 1014   CULT NO GROWTH 3 DAYS 04/14/2015 1014   REPTSTATUS 04/18/2015 FINAL 04/14/2015 1014      Labs: Results for orders placed or performed during the hospital encounter of 2017/03/12 (from the past 48 hour(s))  Glucose, capillary     Status: Abnormal   Collection Time: 03/09/17 11:37 AM  Result Value Ref Range   Glucose-Capillary 269 (H) 65 - 99 mg/dL  Glucose, capillary     Status: Abnormal   Collection Time: 03/09/17  4:31 PM  Result Value Ref Range  Glucose-Capillary 174 (H) 65 - 99 mg/dL  Glucose, capillary     Status: Abnormal   Collection Time: 03/09/17  9:17 PM  Result Value Ref Range   Glucose-Capillary 115 (H) 65 - 99 mg/dL  Glucose, capillary     Status: None   Collection Time: 03/10/17  7:56 AM  Result Value Ref Range   Glucose-Capillary 95 65 - 99 mg/dL   Comment 1 Notify RN   CBC     Status: Abnormal   Collection Time: 03/10/17  8:16 AM  Result Value Ref Range   WBC 6.3 4.0 - 10.5 K/uL   RBC 2.56 (L) 4.22 - 5.81 MIL/uL   Hemoglobin 8.3 (L) 13.0 - 17.0 g/dL   HCT 25.8 (L) 39.0 - 52.0 %   MCV 100.8 (H) 78.0 - 100.0 fL   MCH 32.4 26.0 - 34.0 pg   MCHC 32.2 30.0 - 36.0 g/dL   RDW 15.2 11.5 - 15.5 %   Platelets 149 (L) 150 - 400 K/uL    Comment: Performed at [facility], [place] [address] [place], [place] [address]  Basic metabolic panel     Status: Abnormal   Collection Time: 03/10/17  8:16 AM  Result Value Ref Range   Sodium 143 135 - 145 mmol/L   Potassium 5.0 3.5 - 5.1 mmol/L   Chloride 118 (H) 101 - 111 mmol/L   CO2 15 (L) 22 - 32 mmol/L   Glucose, Bld 115 (H)  65 - 99 mg/dL   BUN 64 (H) 6 - 20 mg/dL   Creatinine, Ser 3.33 (H) 0.61 - 1.24 mg/dL   Calcium 8.9 8.9 - 10.3 mg/dL   GFR calc non Af Amer 16 (L) >60 mL/min   GFR calc Af Amer 19 (L) >60 mL/min    Comment: (NOTE) The eGFR has been calculated using the CKD EPI equation. This calculation has not been validated in all clinical situations. eGFR's persistently <60 mL/min signify possible Chronic Kidney Disease.    Anion gap 10 5 - 15    Comment: Performed at [facility] [address] [place], [place] [address]  Glucose, capillary     Status: Abnormal   Collection Time: 03/10/17 11:34 AM  Result Value Ref Range   Glucose-Capillary 114 (H) 65 - 99 mg/dL  Glucose, capillary     Status: Abnormal   Collection Time: 03/10/17  5:04 PM  Result Value Ref Range   Glucose-Capillary 167 (H) 65 - 99 mg/dL  Glucose, capillary     Status: Abnormal   Collection Time: 03/10/17  9:18 PM  Result Value Ref Range   Glucose-Capillary 113 (H) 65 - 99 mg/dL  Renal function panel     Status: Abnormal   Collection Time: 03/11/17  6:04 AM  Result Value Ref Range   Sodium 146 (H) 135 - 145 mmol/L   Potassium 4.6 3.5 - 5.1 mmol/L   Chloride 122 (H) 101 - 111 mmol/L   CO2 14 (L) 22 - 32 mmol/L   Glucose, Bld 90 65 - 99 mg/dL   BUN 54 (H) 6 - 20 mg/dL   Creatinine, Ser 2.89 (H) 0.61 - 1.24 mg/dL   Calcium 8.5 (L) 8.9 - 10.3 mg/dL   Phosphorus 3.3 2.5 - 4.6 mg/dL   Albumin 2.5 (L) 3.5 - 5.0 g/dL   GFR calc non Af Amer 20 (L) >60 mL/min   GFR calc Af Amer 23 (L) >60 mL/min    Comment: (NOTE) The eGFR has been  calculated using the CKD EPI equation. This calculation has not been validated in all clinical situations. eGFR's persistently <60 mL/min signify possible Chronic Kidney Disease.    Anion gap 10 5 - 15    Comment: Performed at [facility] [address], [place], [place] [address]  CBC     Status: Abnormal   Collection Time: 03/11/17  6:04 AM  Result Value Ref Range   WBC 4.7 4.0  - 10.5 K/uL   RBC 2.22 (L) 4.22 - 5.81 MIL/uL   Hemoglobin 7.1 (L) 13.0 - 17.0 g/dL   HCT 22.1 (L) 39.0 - 52.0 %   MCV 99.5 78.0 - 100.0 fL   MCH 32.0 26.0 - 34.0 pg   MCHC 32.1 30.0 - 36.0 g/dL   RDW 15.1 11.5 - 15.5 %   Platelets 133 (L) 150 - 400 K/uL    Comment: Performed at [facility], [place] [address] [place], [place] [address]  Glucose, capillary     Status: None   Collection Time: 03/11/17  7:51 AM  Result Value Ref Range   Glucose-Capillary 89 65 - 99 mg/dL     Lipid Panel     Component Value Date/Time   CHOL 151 04/14/2015 0652   TRIG 156 (H) 04/14/2015 0652   HDL 52 04/14/2015 0652   CHOLHDL 2.9 04/14/2015 0652   VLDL 31 04/14/2015 0652   LDLCALC 68 04/14/2015 0652     Lab Results  Component Value Date   HGBA1C 6.4 (H) 09/18/2016   HGBA1C 7.6 (H) 03/19/2015   HGBA1C 8.0 (H) 01/16/2015     Lab Results  Component Value Date   LDLCALC 68 04/14/2015   CREATININE 2.89 (H) 03/11/2017     HPI :  77 yo male with CKD III admitted on 03/08/17 with syncope related to hypoglycemia and finding of Aki on CKD III after doubling his lasix dose from 80 mg to 160 mg daily w/o physician orders.  Baseline creatinine around 1.5 to 1.8 , creatinine on admission 4.25   HOSPITAL COURSE:   1)AKI on CKD III -improving, creatinine is down to  2.89  from 4.25 on admission, Baseline creatinine around 1.5 to 1.8 , suspect prerenal azotemia due to over aggressive diuresis compounded by ACE inhibitor use. Continue gentle hydration with normal saline at 75 mL an hour & continue to hold lisinopril and Lasix.  Does not appear volume overloaded.  Nephrology follow-up appreciated.  Follow BMP in a.m.  Renal ultrasound without hydronephrosis.  Patient has bicarbonate of 15 for the last 2 days/NAG metabolic acidosis.   started sodium bicarb tab 1300 BID which may be continued at discharge, plan for follow up labs at 2/14 renal appt with Dr. Florene Glen    2)Acute on chronic Anemia-baseline  hemoglobin not known.  He presented with hemoglobin 14 which was likely due to hemoconcentration.  This has gradually dropped to 8.3>7.1  in the absence of overt bleeding.  Baseline hemoglobin may be in the 10 g range.  Anemia panel results reviewed.  Possibly due to chronic disease and chronic kidney disease. He is on   Plavix for PAD . Follow CBC closely, continue PPI. Instructed to report immediately if he sees any bleeding  . He has been aggressive and nasty and does not want any inpatient work up   3)HFpEF-patient has history of chronic diastolic dysfunction CHF , clinically appears dry,received  gentle hydration as above in #1, Held  Lasix and lisinopril until patient is euvolemic  and renal function improves, last known EF over 55% based on gated images.  Stable.  4)DM-last known A1c 6.4 from August 2018, glimepiride on hold, Use Novolog/Humalog Sliding scale insulin with Accu-Cheks/Fingersticks as ordered. Reasonable inpatient control.   5) chronic pain syndrome-okay to continue home regimen.  Pain controlled.  6) H/o CAD-no ACS type symptoms, Plavix, metoprolol and Lipitor as prescribed    Discharge Exam  Blood pressure (!) 133/54, pulse (!) 45, temperature 98.3 F (36.8 C), temperature source Oral, resp. rate 17, height 6' (1.829 m), weight 76.2 kg (167 lb 15.9 oz), SpO2 100 %.  Gen:NAD, rests comfortably in bed CVS:RRR, no m/r/g Resp:CTA bil, No W/R/R JDY:NXGZ, nt FPO:IPPGFQM skin changes, no LE edema       Follow-up Information    [name], MD. Call.   Specialty:  Family Medicine Why:  Hospital follow-up in 3-5 days, please make appointment with her PCP, CBC, BMP in 3-5 days Contact information: [place] [address] [phone]           Signed: Reyne Dumas 03/11/2017, 11:10 AM        Time spent >1 hour

## 2017-03-11 NOTE — Progress Notes (Signed)
Delman Kitten to be D/C'd  per MD order. Discussed with the patient and all questions fully answered.  VSS, Skin clean, dry and intact without evidence of skin break down, no evidence of skin tears noted.  IV catheter discontinued intact. Site without signs and symptoms of complications. Dressing and pressure applied.  An After Visit Summary was printed and given to the patient. Patient received prescription.  D/c education completed with patient/family including follow up instructions, medication list, d/c activities limitations if indicated, with other d/c instructions as indicated by MD - patient able to verbalize understanding, all questions fully answered.   Patient instructed to return to ED, call 911, or call MD for any changes in condition.   Patient to be escorted via [place], and D/C home via private auto.

## 2017-03-12 LAB — ANTINUCLEAR ANTIBODIES, IFA: ANA Ab, IFA: NEGATIVE

## 2017-03-17 ENCOUNTER — Other Ambulatory Visit: Payer: Self-pay | Admitting: Cardiothoracic Surgery

## 2017-03-17 DIAGNOSIS — R911 Solitary pulmonary nodule: Secondary | ICD-10-CM

## 2017-03-17 DIAGNOSIS — R918 Other nonspecific abnormal finding of lung field: Secondary | ICD-10-CM

## 2017-04-23 ENCOUNTER — Ambulatory Visit
Admission: RE | Admit: 2017-04-23 | Discharge: 2017-04-23 | Disposition: A | Discharge status: Home / Self Care | Payer: Medicare Other | Source: Ambulatory Visit | Attending: Cardiothoracic Surgery | Admitting: Cardiothoracic Surgery

## 2017-04-23 ENCOUNTER — Ambulatory Visit: Payer: Medicare Other | Admitting: Cardiothoracic Surgery

## 2017-04-23 VITALS — BP 126/66 | HR 55 | Resp 20 | Ht 72.0 in | Wt 165.0 lb

## 2017-04-23 DIAGNOSIS — I712 Thoracic aortic aneurysm, without rupture, unspecified: Secondary | ICD-10-CM

## 2017-04-23 DIAGNOSIS — R911 Solitary pulmonary nodule: Secondary | ICD-10-CM

## 2017-04-23 DIAGNOSIS — R918 Other nonspecific abnormal finding of lung field: Secondary | ICD-10-CM | POA: Diagnosis not present

## 2017-04-23 NOTE — Patient Instructions (Signed)

## 2017-04-23 NOTE — Progress Notes (Signed)
[address]       [place] [address]             [facility] Record #[id number] Date of Birth: 08/27/1940  Referring: [name], * Primary Care: [name], MD  Chief Complaint:    Chief Complaint  Patient presents with  . Thoracic Aortic Aneurysm    1 year f/u with Chest CT  . Lung Lesion    History of Present Illness:    Marc [name] 77 y.o. male who I first started following he was also for abnormal screening CT scan of the chest.  Noted to have a mildly dilated ascending aorta. The patient has a long history of smoking since age 32 up to 2 packs a day for more than 30 years, and continues to smoke to this day. He  has known peripheral vascular disease, history of abnormal EKG but no known acute myocardial infarction. He is limited in his physical ability because of chronic back next in knee pain, status post back surgery 3 in right knee replacement. He has known peripheral vascular disease having stents placed in right and left iliac arteries by Dr. Gwenlyn Found.  He underwent bypass surgery in the left leg by Dr. Doren Custard along with toe amputations.  he complains of chronic leg pain bilateral and numbness in his right foot. 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   Patient returns today with a follow-up CT of the chest.  Recently was admitted to the hospital with acute renal failure.    NEEDLE BIOPSY  OF LEFT LUNG LESION HAS BEEN DONEHAS BEEN DONE _  Diagnosis Lung, needle/core biopsy(ies), left lower lobe - BENIGN LUNG PARENCHYMA WITH MILD INFLAMMATION AND INTERSTITIAL FIBROSIS. - THERE IS NO EVIDENCE OF GRANULOMATA OR  MALIGNANCY. Enid Cutter MD     Current Activity/ Functional Status:  Patient is independent with mobility/ambulation, transfers, ADL's, IADL's. patient does have difficulty with ambulation frequently uses a cane when out in public because of leg weakness right greater than left. He notes that he is not as active as he has been over the years, stays at home and watches TV a lot   Zubrod Score: At the time of surgery this patient's most appropriate activity status/level should be described as: []     0    Normal activity, no symptoms []     1    Restricted in physical strenuous activity but ambulatory, able to do out light work [x]     2    Ambulatory and capable of self care, unable to do work activities, up and about               >50 % of waking hours                              []     3    Only limited self care, in bed greater than 50% of waking hours []     4    Completely disabled, no self care, confined to bed or chair []   5    Moribund   Past Medical History:  Diagnosis Date  . Anemia    low iron  . ARF (acute renal failure) ([facility]) 12/2014  . Arthritis   . Bradycardia   . Chronic bronchitis ([facility])   . CKD (chronic kidney disease), stage III ([place])   . Colon polyp   . Depression    occassional.  Situational  . GERD (gastroesophageal reflux disease)   . Gout   . Gout   . History of blood transfusion   . History of GI bleed 2007  . Hypercholesteremia   . Hypertension   . Neuropathy   . PAD (peripheral artery disease) ([facility])   . Pneumonia    denies  . Tobacco abuse disorder 03/10/2012  . Type II diabetes mellitus ([facility])   . Ulcer disease     Past Surgical History:  Procedure Laterality Date  . AMPUTATION Right 02/07/2015   Procedure: GREAT TOE RAY AMPUTATION RIGHT ;  Surgeon: Angelia [name], MD;  Location: [facility];  Service: Vascular;  Laterality: Right;  . AMPUTATION TOE Right 03/21/2015   Procedure: AMPUTATION TOE-RIGHT SECOND TOE;  Surgeon: Angelia [name],  MD;  Location: [facility];  Service: Vascular;  Laterality: Right;  . ANTERIOR CERVICAL DECOMP/DISCECTOMY FUSION  ~ 2007  . ANTERIOR LAT LUMBAR FUSION Left 01/19/2014   Procedure: LATERAL INTERBODY FUSION 1 LEVEL;  Surgeon: Sinclair Ship, MD;  Location: [facility];  Service: Orthopedics;  Laterality: Left;  Left lumbar 3-4 lateral interbody fusion with instrumentation, allograft  . APPLICATION OF WOUND VAC Right 02/07/2015   Procedure:  APPLICATION OF WOUND VAC right great toe amputation site.;  Surgeon: Angelia [name], MD;  Location: [facility];  Service: Vascular;  Laterality: Right;  . APPLICATION OF WOUND VAC Right 03/21/2015   Procedure: APPLICATION OF WOUND VAC RIGHT FOOT;  Surgeon: Angelia [name], MD;  Location: [facility];  Service: Vascular;  Laterality: Right;  . BACK SURGERY    . BYPASS GRAFT FEMORAL-PERONEAL Right 02/07/2015   Procedure: BYPASS GRAFT FEMORAL-PERONEAL Trunk WITH VEIN graft  right leg.;  Surgeon: Angelia [name], MD;  Location: [facility];  Service: Vascular;  Laterality: Right;  . COLONOSCOPY    . ENDARTERECTOMY TIBIOPERONEAL Right 02/07/2015   Procedure: ENDARTERECTOMY TIBIOPERONEAL;  Surgeon: Angelia [name], MD;  Location: [facility];  Service: Vascular;  Laterality: Right;  . EXCISIONAL TOTAL KNEE ARTHROPLASTY WITH ANTIBIOTIC SPACERS Right 04/14/2015   Procedure: RIGHT KNEE [place] OUT AND PLACEMENT OF DRAINS;  Surgeon: Frederik Pear, MD;  Location: [facility];  Service: Orthopedics;  Laterality: Right;  . EYE SURGERY Bilateral    cataract surgery with lens implant  . HERNIA REPAIR  ~ 2007   UHR (02/04/2012)  . ILIAC ARTERY STENT Left    left common/notes (02/04/2012)  . INTRAOPERATIVE ARTERIOGRAM Right 02/07/2015   Procedure: INTRA OPERATIVE ARTERIOGRAM;  Surgeon: Angelia [name], MD;  Location: [facility];  Service: Vascular;  Laterality: Right;  . IRRIGATION AND DEBRIDEMENT KNEE Right 04/14/2015   Procedure: IRRIGATION AND DEBRIDEMENT KNEE;  Surgeon: Frederik Pear, MD;   Location: [facility];  Service: Orthopedics;  Laterality: Right;  . LOWER EXTREMITY ANGIOGRAM N/A 02/04/2012   Procedure: LOWER EXTREMITY ANGIOGRAM;  Surgeon: Lorretta Harp, MD;  Location: [facility] CATH LAB;  Service: Cardiovascular;  Laterality: N/A;  . LUMBAR LAMINECTOMY  11/14  . PATCH ANGIOPLASTY Right 02/07/2015   Procedure: Vein PATCH ANGIOPLASTY to tibioperoneal trunk;  Surgeon: Angelia [name], MD;  Location: [facility];  Service: Vascular;  Laterality: Right;  . PERCUTANEOUS STENT INTERVENTION Left 02/04/2012   Procedure: PERCUTANEOUS STENT INTERVENTION;  Surgeon: Lorretta Harp, MD;  Location: [facility] C[facility] LAB;  Service: Cardiovascular;  Laterality: Left;  lt ext iliac stent  . PERIPHERAL VASCULAR CATHETERIZATION N/A 02/02/2015   Procedure: Abdominal Aortogram w/Lower Extremity;  Surgeon: Angelia [name], MD;  Location: [facility] CV LAB;  Service: Cardiovascular;  Laterality: N/A;  . POLYPECTOMY    . REPLACEMENT TOTAL KNEE Right   . SPINAL CORD STIMULATOR INSERTION N/A 09/20/2016   Procedure: LUMBAR SPINAL CORD STIMULATOR INSERTION;  Surgeon: Clydell Hakim, MD;  Location: [facility];  Service: Neurosurgery;  Laterality: N/A;  LUMBAR SPINAL CORD STIMULATOR INSERTION  . SPLENECTOMY  ~ 1957  . TONSILLECTOMY  ~ 1947  . TOTAL KNEE ARTHROPLASTY Right 02/03/2013   Procedure: TOTAL KNEE ARTHROPLASTY;  Surgeon: [name], MD;  Location: [facility];  Service: Orthopedics;  Laterality: Right;  . UPPER ENDOSCOPY W/ SCLEROTHERAPY  ~ 2007  . WOUND DEBRIDEMENT Right 03/21/2015   Procedure: DEBRIDEMENT WOUND of RIGHT FOOT;  Surgeon: Angelia [name], MD;  Location: [facility] OR;  Service: Vascular;  Laterality: Right;    Family History  Problem Relation Age of Onset  . Colon cancer Paternal Uncle        Uncle  . Heart disease Father   . Hypertension Father   . Heart attack Father   . Heart disease Mother   . Diabetes Mother   . Hypertension Mother     Social History   Socioeconomic History  . Marital  status: Legally Separated    Spouse name: Not on file  . Number of children: 2  . Years of education: Not on file  . Highest education level: Not on file  Occupational History  . Occupation: retired  . Occupation: cattle farm    Comment: owner  Tobacco Use  . Smoking status: Current Every Day Smoker    Packs/day: 1.00    Years: 58.00    Pack years: 58.00    Types: Cigarettes  . Smokeless tobacco: Former Systems developer    Types: Chew  . Tobacco comment: 1-2 pks per day.   Substance and Sexual Activity  . Alcohol use: No    Alcohol/week: 0.0 oz  . Drug use: No  . Sexual activity: Never  Lifestyle  . Physical activity:    Days per week: Not on file    Minutes per session: Not on file  . Stress: Not on file     Social History Narrative   Patient lives at home with his spouse.   Caffeine Use: 2 cups daily    Social History   Tobacco Use  Smoking Status Current Every Day Smoker  . Packs/day: 1.00  . Years: 58.00  . Pack years: 58.00  . Types: Cigarettes  Smokeless Tobacco Former Systems developer  . Types: Chew  Tobacco Comment   1-2 pks per day.     Social History   Substance and Sexual Activity  Alcohol Use No  . Alcohol/week: 0.0 oz     Allergies  Allergen Reactions  . Actos [Pioglitazone] Swelling and Other (See Comments)    LOWER EXTREMITY EDEMA     Current Outpatient Medications  Medication Sig Dispense Refill  . allopurinol (ZYLOPRIM) 300 MG tablet Take 300 mg by mouth at bedtime.     . clopidogrel (PLAVIX) 75 MG tablet Take 75 mg by mouth daily.     . colchicine 0.6 MG tablet Take  0.6 mg by mouth daily.    [name] docusate sodium (COLACE) 100 MG capsule Take 100 mg by mouth 2 (two) times daily.    . furosemide (LASIX) 80 MG tablet Take 1 tablet (80 mg total) by mouth daily for 5 days. 5 tablet 0  . gabapentin (NEURONTIN) 600 MG tablet Take 1,200 mg by mouth every 12 (twelve) hours.     [name] glimepiride (AMARYL) 4 MG tablet Take 8 mg by mouth daily with breakfast.     .  lisinopril (PRINIVIL,ZESTRIL) 20 MG tablet Take 1 tablet (20 mg total) by mouth daily. 30 tablet 1  . metoprolol tartrate (LOPRESSOR) 25 MG tablet Take 0.5 tablets (12.5 mg total) by mouth 2 (two) times daily. 60 tablet 1  . oxyCODONE (ROXICODONE) 15 MG immediate release tablet Take 15 mg by mouth every 6 (six) hours as needed for pain.     . pantoprazole (PROTONIX) 40 MG tablet Take 40 mg by mouth daily.    . predniSONE (DELTASONE) 5 MG tablet Take 5 mg by mouth daily.    . Prenatal Vit-Fe Fumarate-FA (MULTIVITAMIN-PRENATAL) 27-0.8 MG TABS tablet Take 1 tablet by mouth daily at 12 noon.    . senna-docusate (SENOKOT-S) 8.6-50 MG tablet Take 1 tablet by mouth at bedtime as needed for mild constipation. 30 tablet 1  . sodium bicarbonate 650 MG tablet Take 2 tablets (1,300 mg total) by mouth 2 (two) times daily. 60 tablet 1   No current facility-administered medications for this visit.      Review of Systems:  Review of Systems  Constitutional: Positive for malaise/fatigue. Negative for chills, diaphoresis and fever.  HENT: Positive for hearing loss. Negative for congestion, ear discharge, ear pain, nosebleeds, sinus pain, sore throat and tinnitus.   Eyes: Negative.   Respiratory: Positive for cough, sputum production, shortness of breath and wheezing. Negative for hemoptysis and stridor.   Cardiovascular: Positive for palpitations, claudication, leg swelling and PND. Negative for chest pain and orthopnea.  Gastrointestinal: Negative for abdominal pain, blood in stool, constipation, diarrhea, heartburn, melena, nausea and vomiting.  Genitourinary: Negative.   Musculoskeletal: Positive for back pain, joint pain, myalgias and neck pain. Negative for falls.  Skin: Negative.   Neurological: Positive for dizziness and weakness. Negative for tingling, tremors, sensory change, speech change, focal weakness, seizures, loss of consciousness and headaches.  Endo/Heme/Allergies: Negative for environmental  allergies and polydipsia. Bruises/bleeds easily.  Psychiatric/Behavioral: Negative.      Immunizations: Flu up to date [ y ]; Pneumococcal up to date [facility]  ];   Physical Exam: BP 126/66   Pulse (!) 55   Resp 20   Ht 6' (1.829 m)   Wt 165 lb (74.8 kg)   SpO2 96% Comment: RA  BMI 22.38 kg/m   General appearance: alert, cooperative, appears older than stated age, fatigued and no distress Head: Normocephalic, without obvious abnormality, atraumatic Neck: no adenopathy, no carotid bruit, no JVD, supple, symmetrical, trachea midline and thyroid not enlarged, symmetric, no tenderness/mass/nodules Lymph nodes: Cervical, supraclavicular, and axillary nodes normal. Resp: diminished breath sounds bibasilar Back: symmetric, no curvature. ROM normal. No CVA tenderness. Cardio: regular rate and rhythm, S1, S2 normal, no murmur, click, rub or gallop GI: soft, non-tender; bowel sounds normal; no masses,  no organomegaly Extremities: edema Bilateral lower extremity edema worse on the right than left without palpable pedal pulses and Homans sign is negative, no sign of DVT Neurologic: Grossly normal Patient has significant difficulty walking space because of right leg weakness and  walks with a cane    Diagnostic Studies & Laboratory data:     Recent Radiology Findings:  Ct Chest Wo Contrast  Result Date: 04/23/2017 CLINICAL DATA:  Follow-up pulmonary nodules and ectatic ascending thoracic aorta. Former smoker. EXAM: CT CHEST WITHOUT CONTRAST TECHNIQUE: Multidetector CT imaging of the chest was performed following the standard protocol without IV contrast. COMPARISON:  04/11/2016 chest CT. FINDINGS: Cardiovascular: Normal heart size. No significant pericardial fluid/thickening. Left anterior descending and right coronary atherosclerosis. Atherosclerotic thoracic aorta with 4.5 cm ascending thoracic aortic aneurysm, stable since 04/11/2016 using similar measurement technique. Stable dilated main  pulmonary artery (3.7 cm diameter). Mediastinum/Nodes: No discrete thyroid nodules. Unremarkable esophagus. No pathologically enlarged axillary, mediastinal or gross hilar lymph nodes, noting limited sensitivity for the detection of hilar adenopathy on this noncontrast study. Lungs/Pleura: No pneumothorax. Stable scattered calcified pleural plaques bilaterally. Stable smooth bilateral pleural thickening posteriorly. No pleural effusions. Saber sheath trachea, mild centrilobular and paraseptal emphysema and diffuse bronchial wall thickening. Nodular 2.9 x 1.0 cm focus of consolidation in the posterior left lower lobe abutting a site of smooth pleural thickening, with associated mild volume loss and mild surrounding distortion (series 8/image 108), previously 3.0 x 1.0 cm using similar measurement technique, stable, compatible with rounded atelectasis. Posterior right lower lobe 4 mm solid pulmonary nodule (series 8/image 124) is stable since 07/11/2014 CT and considered benign. No acute consolidative airspace disease, lung masses or new significant pulmonary nodules. Stable tiny calcified superior segment left lower lobe granuloma. Upper abdomen: No acute abnormality. Musculoskeletal: No aggressive appearing focal osseous lesions. Marked thoracic spondylosis. Inferior approach spinal stimulator lead terminates in the posterior midthoracic spinal canal. IMPRESSION: 1. 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 Ascending thoracic aortic aneurysm. Recommend semi-annual imaging followup by CTA or MRA and referral to cardiothoracic surgery if not already obtained. This recommendation follows 2010 ACCF/AHA/AATS/ACR/ASA/SCA/SCAI/SIR/STS/SVM Guidelines for the Diagnosis and Management of Patients With Thoracic Aortic Disease. Circulation. 2010; 121: V761-Y073. 2. Stable calcified pleural plaques and smooth pleural thickening bilaterally, compatible with asbestos related pleural disease. No pleural effusions. 3.  Continued stability of rounded atelectasis in the posterior left lower lobe. 4. Stable dilated main pulmonary artery, suggesting chronic pulmonary arterial hypertension. 5. Two-vessel coronary atherosclerosis. Aortic Atherosclerosis (ICD10-I70.0) and Emphysema (ICD10-J43.9). Electronically Signed   By: Ilona Sorrel M.D.   On: 04/23/2017 15:46    I have independently reviewed the above radiology studies  and reviewed the findings with the patient.     Ct Chest Wo Contrast  Result Date: 04/11/2016 CLINICAL DATA:  77 year old male with history of thoracic aortic aneurysm. Cough. Followup study. EXAM: CT CHEST WITHOUT CONTRAST TECHNIQUE: Multidetector CT imaging of the chest was performed following the standard protocol without IV contrast. COMPARISON:  Chest CT 08/14/2015. FINDINGS: Cardiovascular: Heart size is normal. There is no significant pericardial fluid, thickening or pericardial calcification. There is aortic atherosclerosis, as well as atherosclerosis of the great vessels of the mediastinum and the coronary arteries, including calcified atherosclerotic plaque in the left main, left anterior descending, left circumflex and right coronary arteries. Calcifications of the aortic valve. Mediastinum/Nodes: No pathologically enlarged mediastinal or hilar lymph nodes. Please note that accurate exclusion of hilar adenopathy is limited on noncontrast CT scans. Esophagus is unremarkable in appearance. No axillary lymphadenopathy. Lungs/Pleura: Several tiny 1-3 mm pulmonary nodules are again noted throughout the periphery of the lungs bilaterally, highly nonspecific, but similar to prior study, presumably benign areas of mucoid impaction within terminal bronchioles. The largest of these measures  3 mm in the left upper lobe (image 31 of series 4). No other larger more suspicious appearing pulmonary nodules or masses are noted. There continues to be a rounded pleural-based density in the posterior aspect of the  left lower lobe with some "pleural tails", which is unchanged, most compatible with rounded atelectasis. Calcified pleural plaques are again noted in the thorax bilaterally, compatible with asbestos related pleural disease. No acute consolidative airspace disease. No pleural effusions. Mild diffuse bronchial wall thickening with mild centrilobular and paraseptal emphysema, most evident in the lung apices. Saber sheath trachea. Upper Abdomen: Aortic atherosclerosis. Musculoskeletal: There are no aggressive appearing lytic or blastic lesions noted in the visualized portions of the skeleton. IMPRESSION: 1. Ectasia of the ascending thoracic aorta which currently measures 4.4 cm in diameter (previously 4.2 cm on 08/14/2015). Recommend annual imaging followup by CTA or MRA. This recommendation follows 2010 ACCF/AHA/AATS/ACR/ASA/SCA/SCAI/SIR/STS/SVM Guidelines for the Diagnosis and Management of Patients with Thoracic Aortic Disease. Circulation. 2010; 121: O350-K938. 2. Calcified pleural plaques bilaterally, indicative of asbestos related pleural disease. Chronic rounded atelectasis in the posteromedial aspect of the left lower lobe is unchanged. 3. Saber sheath trachea, mild diffuse bronchial wall thickening with mild centrilobular and paraseptal emphysema; imaging findings suggestive of underlying COPD. 4. Left main and 3 vessel coronary artery disease. Assessment for potential risk factor modification, dietary therapy or pharmacologic therapy may be warranted, if clinically indicated. Electronically Signed   By: Vinnie Langton M.D.   On: 04/11/2016 12:42   Ct Chest Wo Contrast  08/14/2015  CLINICAL DATA:  Follow-up lung mass. Negative biopsy left lung 2016. EXAM: CT CHEST WITHOUT CONTRAST TECHNIQUE: Multidetector CT imaging of the chest was performed following the standard protocol without IV contrast. COMPARISON:  CT chest dated 12/12/2014 and CT lung biopsy dated 07/26/2014. FINDINGS: Mediastinum/Lymph Nodes:  Scattered atherosclerotic changes of the thoracic aorta. Stable ascending thoracic aortic aneurysm measuring 4.2 cm, unchanged. Heart size is normal. Coronary artery calcifications noted. No pericardial effusion. No masses or enlarged lymph nodes seen within the mediastinum or perihilar regions. Esophagus is unremarkable. Trachea and central bronchi are unremarkable. Lungs/Pleura: Rounded pleural based mass/consolidation within the posterior medial left lower lobe is stable, measuring 3 x 1.4 cm. Lungs are otherwise clear. No pleural effusion. Upper abdomen: Limited images of the upper abdomen are unremarkable. Musculoskeletal: Mild degenerative change again noted throughout the kyphotic thoracic spine. No acute or suspicious osseous finding. Superficial soft tissues are unremarkable. IMPRESSION: 1. Stable appearance of the rounded pleural based mass/consolidation within the posterior-medial portion of the left lower lobe, again measuring 3 x 1.4 cm, previously biopsied with benign pathology result. This has now been shown to be stable for greater than 1 year. As indicated on the previous CT, this is most likely rounded atelectasis. Would consider an additional follow-up chest CT in 12 months to ensure 2 year stability. 2. Additional 5 mm subpleural nodule in the right lower lobe (series 4, image 93) is also stable compared to the previous exams suggesting benignity. This can also be assessed for 2 year stability in 12 months. 3. Stable aneurysm of the ascending thoracic aorta measuring 4.2 cm. Recommend semi-annual imaging followup by CTA or MRA and referral to cardiothoracic surgery if not already obtained. This recommendation follows 2010 ACCF/AHA/AATS/ACR/ASA/SCA/SCAI/SIR/STS/SVM Guidelines for the Diagnosis and Management of Patients With Thoracic Aortic Disease. Circulation. 2010; 121: e266-e36 4. Aortic atherosclerosis. Electronically Signed   By: Franki Cabot M.D.   On: 08/14/2015 14:51   Ct Chest Wo  Contrast  12/12/2014  CLINICAL DATA:  Follow-up left lower lobe mass, negative biopsy, smoker EXAM: CT CHEST WITHOUT CONTRAST TECHNIQUE: Multidetector CT imaging of the chest was performed following the standard protocol without IV contrast. COMPARISON:  PET-CT dated 07/19/2014. Lung cancer screening chest CT dated 07/11/2014. FINDINGS: Mediastinum/Nodes: Heart is normal in size. No pericardial effusion. Coronary atherosclerosis. Atherosclerotic calcifications of the aortic arch. 4.2 cm ascending thoracic aortic aneurysm, unchanged. Small mediastinal lymph nodes which do not meet pathologic CT size criteria. Visualized thyroid is unremarkable. Lungs/Pleura: 1.6 x 3.0 cm rounded subpleural opacity in the posteromedial left lower lobe (series 4/ image 41), previously 1.3 x 3.0 cm, unchanged. Additional 5 mm subpleural nodule in the right lower lobe (series 4/image 46), unchanged. Underlying mild paraseptal emphysematous changes. No focal consolidation. Calcified pleural plaques (series 3/ image 22). No pleural effusion or pneumothorax. Upper abdomen: Visualized upper abdomen is notable for vascular calcifications. Musculoskeletal: Degenerative changes of the visualized thoracolumbar spine. IMPRESSION: 1.6 x 3.0 cm rounded subpleural opacities in the posteromedial left lower lobe, unchanged. Given the benign biopsy results, the lack of hypermetabolism on PET, and the associated asbestos related pleural disease, this overall appearance remains compatible with rounded atelectasis. Consider follow-up CT chest in 6-12 months. 4.2 cm ascending thoracic aortic aneurysm, unchanged. Recommend semi-annual imaging followup by CTA or MRA and referral to cardiothoracic surgery if not already obtained. This recommendation follows 2010 ACCF/AHA/AATS/ACR/ASA/SCA/SCAI/SIR/STS/SVM Guidelines for the Diagnosis and Management of Patients With Thoracic Aortic Disease. Circulation. 2010; 121: X324-M01 Electronically Signed   By: Julian Hy M.D.   On: 12/12/2014 15:07    Nm Pet Image Initial (pi) Skull Base To Thigh  07/19/2014   CLINICAL DATA:  Initial treatment strategy for left lower lobe lung nodule.  EXAM: NUCLEAR MEDICINE PET SKULL BASE TO THIGH  TECHNIQUE: 9.8 mCi F-18 FDG was injected intravenously. Full-ring PET imaging was performed from the skull base to thigh after the radiotracer. CT data was obtained and used for attenuation correction and anatomic localization.  FASTING BLOOD GLUCOSE:  Value: 163 mg/dl  COMPARISON:  Screening chest CT on 07/11/2014  FINDINGS: NECK  No hypermetabolic lymph nodes in the neck.  CHEST  No hypermetabolic mediastinal or hilar nodes. Pleural-based nodular opacity in the posterior left lower lobe measures 1.5 x 2.6 cm, which is stable in size. This shows low-grade metabolic activity with SUV max of 2.4.  A 5 mm pleural-based nodule is all seen in the posterior right lower lobe on image 58/series 7 which is stable and shows no hypermetabolic activity although it is too small to characterize by PET.  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 Mild emphysema again demonstrated.  ABDOMEN/PELVIS  No abnormal hypermetabolic activity within the liver, pancreas, adrenal glands, or spleen. No hypermetabolic lymph nodes in the abdomen or pelvis.  Mildly enlarged prostate seen with mass effect on bladder base.  SKELETON  No focal hypermetabolic activity to suggest skeletal metastasis.  IMPRESSION: 1.5 x 2.6 cm pleural-based nodule in posterior left lower lobe shows low-grade metabolic activity, which is nonspecific. Differential considerations include low-grade malignancy, rounded atelectasis, and inflammatory or infectious process.  Nonspecific 5 mm pleural-based nodule in posterior right lower lobe shows no metabolic activity, but is too small to characterize by PET. Continued attention on follow-up CT recommended.  No evidence of thoracic nodal metastases or distant metastatic disease.  4.5 cm  ascending thoracic aortic aneurysm again noted. Recommend semi-annual imaging followup by CTA or MRA and referral to cardiothoracic surgery if not  already obtained. This recommendation follows 2010 ACCF/AHA/AATS/ACR/ASA/SCA/SCAI/SIR/STS/SVM Guidelines for the Diagnosis and Management of Patients With Thoracic Aortic Disease. Circulation. 2010; 121: E993-Z169.   Electronically Signed   By: Earle Gell M.D.   On: 07/19/2014 10:11   Ct Chest Lung Ca Screen Low Dose W/o Cm  07/11/2014   CLINICAL DATA:  77 year old male current smoker with 90 pack-year history of smoking. Lung cancer screening examination.  EXAM: CT CHEST WITHOUT CONTRAST  TECHNIQUE: Multidetector CT imaging of the chest was performed following the standard protocol without IV contrast.  COMPARISON:  No priors.  FINDINGS: Mediastinum/Lymph Nodes: Heart size is normal. There is no significant pericardial fluid, thickening or pericardial calcification. No pathologically enlarged mediastinal or hilar lymph nodes. There is atherosclerosis of the thoracic aorta, the great vessels of the mediastinum and the coronary arteries, including calcified atherosclerotic plaque in the left main, left anterior descending, left circumflex and right coronary arteries. In addition, there is mild aneurysmal dilatation (4.5 cm) of the ascending thoracic aorta. Calcifications of the aortic valve. Please note that accurate exclusion of hilar adenopathy is limited on noncontrast CT scans. Esophagus is unremarkable in appearance. Saber sheath trachea. No axillary lymphadenopathy.  Lungs/Pleura: In the posterior aspect of the left lower lobe (image 181 of series 3) there is a pleural-based nodular opacity which has a volume derived mean diameter of approximately 23.8 mm. Tiny subpleural nodule also noted in the periphery of the right lower lobe (image 280 of series 3) as well, with a mean diameter of 6.9 mm. No acute consolidative airspace disease. No pleural effusions.  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 Mild diffuse bronchial wall thickening with mild centrilobular and paraseptal emphysema.  Upper Abdomen: Atherosclerosis.  Otherwise, unremarkable.  Musculoskeletal/Soft Tissues: There are no aggressive appearing lytic or blastic lesions noted in the visualized portions of the skeleton.  IMPRESSION: 1. Lung-RADS Category 4BS, suspicious. Specifically, there is a large pleural based nodule in the posterior left lower lobe which has a volume derived mean diameter of approximately 23.8 mm. Given the apparent asbestos related pleural disease, this could simply represent an area of rounded atelectasis, however, the possibility of a malignant bronchogenic neoplasm, or early malignant mesothelioma is not excluded, and further evaluation with PET-CT is recommended at this time. 2. The "S" modifier above refers to potentially clinically significant non lung cancer related findings. Specifically, Atherosclerosis, including left main and 3 vessel coronary artery disease. Assessment for potential risk factor modification, dietary therapy or pharmacologic therapy may be warranted, if clinically indicated. 3. In addition, there is mild aneurysmal dilatation (4.5 cm in diameter) of the ascending thoracic aorta. Ascending thoracic aortic aneurysm. Recommend semi-annual imaging followup by CTA or MRA and referral to cardiothoracic surgery if not already obtained. This recommendation follows 2010 ACCF/AHA/AATS/ACR/ASA/SCA/SCAI/SIR/STS/SVM Guidelines for the Diagnosis and Management of Patients With Thoracic Aortic Disease. Circulation. 2010; 121: C789-F810. 4. Mild diffuse bronchial wall thickening with mild centrilobular and paraseptal emphysema; imaging findings suggestive of underlying COPD. These results were called by telephone at the time of interpretation on 07/11/2014 at 9:58 am to Dr. Claris Gower, who verbally acknowledged  these results.   Electronically Signed   By: Vinnie Langton M.D.   On: 07/11/2014 10:05     I have independently reviewed the above radiology studies  and reviewed the findings with the patient.   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 Recanalized flow to the right popliteal artery. Occlusion of the right ATA (known occlusion). Near occlusion of the right PTA. Monophasic flow in the right peroneal artery.  Other specific details can be found in the table(s) above. Prepared and Electronically Authenticated by  Curt Jews 2016-06-27T16:38:16   Assessment / Plan:   Stable left plural base opacity left lower lobe, previously biopsied negative has not changed since previous scan  .  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   Ascending thoracic aortic aneurysm-mild aneurysmal dilatation (4.5 cm in diameter) of the ascending thoracic aorta-patient has no murmur of aortic insufficiency  or aortic stenosis unchanged since the previous scan,   Atherosclerosis, including left main and 3 vessel coronary artery disease - the patient denies any definite anginal symptoms but does have known left main and three-vessel coronary artery disease on a nonspecific CT scan based on calcium only.  He is followed by Dr. Wynonia Lawman cardiology  He is followed in the vascular surgery office following foot infection and bypass surgery in the right leg.   We'll plan repeat CT scan of the chest in 18 months  Patient was warned about not using Cipro and similar antibiotics. Recent studies have raised concern that fluoroquinolone antibiotics could be associated with an increased risk of aortic aneurysm Fluoroquinolones have non-antimicrobial properties that might jeopardise the integrity of the extracellular matrix of the vascular wall In a  propensity score matched cohort study in [place], there was a 66% increased rate of aortic aneurysm or dissection associated with oral fluoroquinolone use, compared with amoxicillin use, within a 60 day risk period from start of treatment    Grace Isaac MD      [address] [place] [address] Office [phone]   [place]

## 2017-07-04 ENCOUNTER — Inpatient Hospital Stay (HOSPITAL_COMMUNITY)
Admission: EM | Admit: 2017-07-04 | Discharge: 2017-07-11 | DRG: 291 | Disposition: A | Discharge status: Skilled Nursing Facility | Payer: Medicare Other | Attending: Family Medicine | Admitting: Family Medicine

## 2017-07-04 ENCOUNTER — Other Ambulatory Visit: Payer: Self-pay

## 2017-07-04 ENCOUNTER — Emergency Department (HOSPITAL_COMMUNITY): Payer: Medicare Other

## 2017-07-04 ENCOUNTER — Encounter (HOSPITAL_COMMUNITY): Payer: Self-pay | Admitting: Pharmacy Technician

## 2017-07-04 DIAGNOSIS — E1151 Type 2 diabetes mellitus with diabetic peripheral angiopathy without gangrene: Secondary | ICD-10-CM | POA: Diagnosis present

## 2017-07-04 DIAGNOSIS — R319 Hematuria, unspecified: Secondary | ICD-10-CM | POA: Diagnosis not present

## 2017-07-04 DIAGNOSIS — Z89411 Acquired absence of right great toe: Secondary | ICD-10-CM

## 2017-07-04 DIAGNOSIS — L89153 Pressure ulcer of sacral region, stage 3: Secondary | ICD-10-CM | POA: Diagnosis present

## 2017-07-04 DIAGNOSIS — I471 Supraventricular tachycardia: Secondary | ICD-10-CM | POA: Diagnosis not present

## 2017-07-04 DIAGNOSIS — Z66 Do not resuscitate: Secondary | ICD-10-CM | POA: Diagnosis present

## 2017-07-04 DIAGNOSIS — M549 Dorsalgia, unspecified: Secondary | ICD-10-CM | POA: Diagnosis not present

## 2017-07-04 DIAGNOSIS — E1129 Type 2 diabetes mellitus with other diabetic kidney complication: Secondary | ICD-10-CM | POA: Diagnosis present

## 2017-07-04 DIAGNOSIS — Z9081 Acquired absence of spleen: Secondary | ICD-10-CM

## 2017-07-04 DIAGNOSIS — R823 Hemoglobinuria: Secondary | ICD-10-CM | POA: Diagnosis present

## 2017-07-04 DIAGNOSIS — I4581 Long QT syndrome: Secondary | ICD-10-CM | POA: Diagnosis present

## 2017-07-04 DIAGNOSIS — J42 Unspecified chronic bronchitis: Secondary | ICD-10-CM | POA: Diagnosis present

## 2017-07-04 DIAGNOSIS — R262 Difficulty in walking, not elsewhere classified: Secondary | ICD-10-CM | POA: Diagnosis present

## 2017-07-04 DIAGNOSIS — W010XXA Fall on same level from slipping, tripping and stumbling without subsequent striking against object, initial encounter: Secondary | ICD-10-CM | POA: Diagnosis present

## 2017-07-04 DIAGNOSIS — I495 Sick sinus syndrome: Secondary | ICD-10-CM

## 2017-07-04 DIAGNOSIS — Z981 Arthrodesis status: Secondary | ICD-10-CM

## 2017-07-04 DIAGNOSIS — E7800 Pure hypercholesterolemia, unspecified: Secondary | ICD-10-CM | POA: Diagnosis present

## 2017-07-04 DIAGNOSIS — Z23 Encounter for immunization: Secondary | ICD-10-CM

## 2017-07-04 DIAGNOSIS — J189 Pneumonia, unspecified organism: Secondary | ICD-10-CM

## 2017-07-04 DIAGNOSIS — J9811 Atelectasis: Secondary | ICD-10-CM | POA: Diagnosis present

## 2017-07-04 DIAGNOSIS — Z961 Presence of intraocular lens: Secondary | ICD-10-CM | POA: Diagnosis present

## 2017-07-04 DIAGNOSIS — N183 Chronic kidney disease, stage 3 unspecified: Secondary | ICD-10-CM | POA: Diagnosis present

## 2017-07-04 DIAGNOSIS — E1122 Type 2 diabetes mellitus with diabetic chronic kidney disease: Secondary | ICD-10-CM

## 2017-07-04 DIAGNOSIS — E785 Hyperlipidemia, unspecified: Secondary | ICD-10-CM | POA: Diagnosis present

## 2017-07-04 DIAGNOSIS — S2232XA Fracture of one rib, left side, initial encounter for closed fracture: Secondary | ICD-10-CM

## 2017-07-04 DIAGNOSIS — Y9301 Activity, walking, marching and hiking: Secondary | ICD-10-CM | POA: Diagnosis present

## 2017-07-04 DIAGNOSIS — I130 Hypertensive heart and chronic kidney disease with heart failure and stage 1 through stage 4 chronic kidney disease, or unspecified chronic kidney disease: Principal | ICD-10-CM | POA: Diagnosis present

## 2017-07-04 DIAGNOSIS — N179 Acute kidney failure, unspecified: Secondary | ICD-10-CM | POA: Diagnosis present

## 2017-07-04 DIAGNOSIS — Z91128 Patient's intentional underdosing of medication regimen for other reason: Secondary | ICD-10-CM

## 2017-07-04 DIAGNOSIS — I2510 Atherosclerotic heart disease of native coronary artery without angina pectoris: Secondary | ICD-10-CM | POA: Diagnosis not present

## 2017-07-04 DIAGNOSIS — E873 Alkalosis: Secondary | ICD-10-CM | POA: Diagnosis not present

## 2017-07-04 DIAGNOSIS — I509 Heart failure, unspecified: Secondary | ICD-10-CM

## 2017-07-04 DIAGNOSIS — Z9842 Cataract extraction status, left eye: Secondary | ICD-10-CM

## 2017-07-04 DIAGNOSIS — J9601 Acute respiratory failure with hypoxia: Secondary | ICD-10-CM | POA: Diagnosis present

## 2017-07-04 DIAGNOSIS — I739 Peripheral vascular disease, unspecified: Secondary | ICD-10-CM | POA: Diagnosis present

## 2017-07-04 DIAGNOSIS — K219 Gastro-esophageal reflux disease without esophagitis: Secondary | ICD-10-CM | POA: Diagnosis present

## 2017-07-04 DIAGNOSIS — I7121 Aneurysm of the ascending aorta, without rupture: Secondary | ICD-10-CM | POA: Diagnosis present

## 2017-07-04 DIAGNOSIS — I440 Atrioventricular block, first degree: Secondary | ICD-10-CM | POA: Diagnosis present

## 2017-07-04 DIAGNOSIS — S2242XA Multiple fractures of ribs, left side, initial encounter for closed fracture: Secondary | ICD-10-CM | POA: Diagnosis present

## 2017-07-04 DIAGNOSIS — J181 Lobar pneumonia, unspecified organism: Secondary | ICD-10-CM | POA: Diagnosis present

## 2017-07-04 DIAGNOSIS — R609 Edema, unspecified: Secondary | ICD-10-CM | POA: Diagnosis not present

## 2017-07-04 DIAGNOSIS — W19XXXA Unspecified fall, initial encounter: Secondary | ICD-10-CM

## 2017-07-04 DIAGNOSIS — Z7902 Long term (current) use of antithrombotics/antiplatelets: Secondary | ICD-10-CM

## 2017-07-04 DIAGNOSIS — F17210 Nicotine dependence, cigarettes, uncomplicated: Secondary | ICD-10-CM | POA: Diagnosis present

## 2017-07-04 DIAGNOSIS — Z79899 Other long term (current) drug therapy: Secondary | ICD-10-CM

## 2017-07-04 DIAGNOSIS — D631 Anemia in chronic kidney disease: Secondary | ICD-10-CM | POA: Diagnosis present

## 2017-07-04 DIAGNOSIS — Z89421 Acquired absence of other right toe(s): Secondary | ICD-10-CM

## 2017-07-04 DIAGNOSIS — R300 Dysuria: Secondary | ICD-10-CM | POA: Diagnosis not present

## 2017-07-04 DIAGNOSIS — R0902 Hypoxemia: Secondary | ICD-10-CM

## 2017-07-04 DIAGNOSIS — Z9841 Cataract extraction status, right eye: Secondary | ICD-10-CM

## 2017-07-04 DIAGNOSIS — I712 Thoracic aortic aneurysm, without rupture: Secondary | ICD-10-CM | POA: Diagnosis present

## 2017-07-04 DIAGNOSIS — M109 Gout, unspecified: Secondary | ICD-10-CM | POA: Diagnosis present

## 2017-07-04 DIAGNOSIS — T501X6A Underdosing of loop [high-ceiling] diuretics, initial encounter: Secondary | ICD-10-CM | POA: Diagnosis present

## 2017-07-04 DIAGNOSIS — Z7952 Long term (current) use of systemic steroids: Secondary | ICD-10-CM

## 2017-07-04 DIAGNOSIS — I5033 Acute on chronic diastolic (congestive) heart failure: Secondary | ICD-10-CM | POA: Diagnosis present

## 2017-07-04 DIAGNOSIS — E1165 Type 2 diabetes mellitus with hyperglycemia: Secondary | ICD-10-CM | POA: Diagnosis present

## 2017-07-04 DIAGNOSIS — L8990 Pressure ulcer of unspecified site, unspecified stage: Secondary | ICD-10-CM

## 2017-07-04 LAB — URINALYSIS, ROUTINE W REFLEX MICROSCOPIC
BACTERIA UA: NONE SEEN
Bilirubin Urine: NEGATIVE
Glucose, UA: 50 mg/dL — AB
KETONES UR: NEGATIVE mg/dL
Leukocytes, UA: NEGATIVE
NITRITE: NEGATIVE
PROTEIN: NEGATIVE mg/dL
Specific Gravity, Urine: 1.013 (ref 1.005–1.030)
pH: 5 (ref 5.0–8.0)

## 2017-07-04 LAB — LACTIC ACID, PLASMA: LACTIC ACID, VENOUS: 1.7 mmol/L (ref 0.5–1.9)

## 2017-07-04 LAB — COMPREHENSIVE METABOLIC PANEL
ALK PHOS: 152 U/L — AB (ref 38–126)
ALT: 8 U/L — ABNORMAL LOW (ref 17–63)
ANION GAP: 16 — AB (ref 5–15)
AST: 18 U/L (ref 15–41)
Albumin: 3.1 g/dL — ABNORMAL LOW (ref 3.5–5.0)
BILIRUBIN TOTAL: 0.8 mg/dL (ref 0.3–1.2)
BUN: 73 mg/dL — ABNORMAL HIGH (ref 6–20)
CALCIUM: 9.5 mg/dL (ref 8.9–10.3)
CO2: 24 mmol/L (ref 22–32)
Chloride: 97 mmol/L — ABNORMAL LOW (ref 101–111)
Creatinine, Ser: 2.95 mg/dL — ABNORMAL HIGH (ref 0.61–1.24)
GFR, EST AFRICAN AMERICAN: 22 mL/min — AB (ref >60)
GFR, EST NON AFRICAN AMERICAN: 19 mL/min — AB (ref >60)
GLUCOSE: 342 mg/dL — AB (ref 65–99)
POTASSIUM: 4.3 mmol/L (ref 3.5–5.1)
Sodium: 137 mmol/L (ref 135–145)
TOTAL PROTEIN: 7.4 g/dL (ref 6.5–8.1)

## 2017-07-04 LAB — PROTIME-INR
INR: 1.21
PROTHROMBIN TIME: 15.2 s (ref 11.4–15.2)

## 2017-07-04 LAB — CBG MONITORING, ED: GLUCOSE-CAPILLARY: 336 mg/dL — AB (ref 65–99)

## 2017-07-04 LAB — AMMONIA: Ammonia: 26 umol/L (ref 9–35)

## 2017-07-04 LAB — I-STAT VENOUS BLOOD GAS, ED
ACID-BASE EXCESS: 2 mmol/L (ref 0.0–2.0)
BICARBONATE: 28.7 mmol/L — AB (ref 20.0–28.0)
O2 SAT: 73 %
TCO2: 30 mmol/L (ref 22–32)
pCO2, Ven: 53.4 mmHg (ref 44.0–60.0)
pH, Ven: 7.338 (ref 7.250–7.430)
pO2, Ven: 42 mmHg (ref 32.0–45.0)

## 2017-07-04 LAB — I-STAT CG4 LACTIC ACID, ED: Lactic Acid, Venous: 1.81 mmol/L (ref 0.5–1.9)

## 2017-07-04 LAB — CBC WITH DIFFERENTIAL/PLATELET
Abs Immature Granulocytes: 0.1 10*3/uL (ref 0.0–0.1)
BASOS ABS: 0 10*3/uL (ref 0.0–0.1)
BASOS PCT: 0 %
EOS ABS: 0 10*3/uL (ref 0.0–0.7)
Eosinophils Relative: 0 %
HCT: 35.9 % — ABNORMAL LOW (ref 39.0–52.0)
Hemoglobin: 11.9 g/dL — ABNORMAL LOW (ref 13.0–17.0)
IMMATURE GRANULOCYTES: 1 %
Lymphocytes Relative: 7 %
Lymphs Abs: 0.7 10*3/uL (ref 0.7–4.0)
MCH: 32.2 pg (ref 26.0–34.0)
MCHC: 33.1 g/dL (ref 30.0–36.0)
MCV: 97.3 fL (ref 78.0–100.0)
MONOS PCT: 6 %
Monocytes Absolute: 0.6 10*3/uL (ref 0.1–1.0)
NEUTROS ABS: 7.9 10*3/uL — AB (ref 1.7–7.7)
Neutrophils Relative %: 86 %
PLATELETS: 343 10*3/uL (ref 150–400)
RBC: 3.69 MIL/uL — ABNORMAL LOW (ref 4.22–5.81)
RDW: 13.2 % (ref 11.5–15.5)
WBC: 9.3 10*3/uL (ref 4.0–10.5)

## 2017-07-04 LAB — I-STAT TROPONIN, ED: Troponin i, poc: 0.02 ng/mL (ref 0.00–0.08)

## 2017-07-04 LAB — ETHANOL: Alcohol, Ethyl (B): <10 mg/dL (ref <10)

## 2017-07-04 LAB — TROPONIN I

## 2017-07-04 LAB — CK: Total CK: 106 U/L (ref 49–397)

## 2017-07-04 LAB — BRAIN NATRIURETIC PEPTIDE: B Natriuretic Peptide: 38.2 pg/mL (ref 0.0–100.0)

## 2017-07-04 MED ORDER — PREDNISONE 10 MG PO TABS
10.0000 mg | ORAL_TABLET | Freq: Every day | ORAL | Status: DC
  Administered 2017-07-05 – 2017-07-11 (×7): 10 mg via ORAL
  Filled 2017-07-04 (×7): qty 1

## 2017-07-04 MED ORDER — SODIUM CHLORIDE 0.9 % IV SOLN
1.0000 g | Freq: Once | INTRAVENOUS | Status: AC
  Administered 2017-07-04: 1 g via INTRAVENOUS
  Filled 2017-07-04: qty 10

## 2017-07-04 MED ORDER — FENTANYL CITRATE (PF) 100 MCG/2ML IJ SOLN
25.0000 ug | Freq: Once | INTRAMUSCULAR | Status: AC
  Administered 2017-07-04: 25 ug via INTRAVENOUS
  Filled 2017-07-04: qty 2

## 2017-07-04 MED ORDER — DOCUSATE SODIUM 100 MG PO CAPS
100.0000 mg | ORAL_CAPSULE | Freq: Two times a day (BID) | ORAL | Status: DC
  Administered 2017-07-05 – 2017-07-11 (×14): 100 mg via ORAL
  Filled 2017-07-04 (×14): qty 1

## 2017-07-04 MED ORDER — FENTANYL CITRATE (PF) 100 MCG/2ML IJ SOLN
25.0000 ug | INTRAMUSCULAR | Status: DC | PRN
  Administered 2017-07-04 – 2017-07-09 (×3): 25 ug via INTRAVENOUS
  Filled 2017-07-04 (×4): qty 2

## 2017-07-04 MED ORDER — CLOPIDOGREL BISULFATE 75 MG PO TABS
75.0000 mg | ORAL_TABLET | ORAL | Status: DC
  Administered 2017-07-05 – 2017-07-11 (×5): 75 mg via ORAL
  Filled 2017-07-04 (×6): qty 1

## 2017-07-04 MED ORDER — ALLOPURINOL 300 MG PO TABS
300.0000 mg | ORAL_TABLET | Freq: Every day | ORAL | Status: DC
  Administered 2017-07-04 – 2017-07-10 (×7): 300 mg via ORAL
  Filled 2017-07-04 (×8): qty 1

## 2017-07-04 MED ORDER — SODIUM CHLORIDE 0.9 % IV SOLN
500.0000 mg | Freq: Once | INTRAVENOUS | Status: AC
  Administered 2017-07-04: 500 mg via INTRAVENOUS
  Filled 2017-07-04: qty 500

## 2017-07-04 MED ORDER — FUROSEMIDE 10 MG/ML IJ SOLN
40.0000 mg | Freq: Two times a day (BID) | INTRAMUSCULAR | Status: DC
  Administered 2017-07-04 – 2017-07-07 (×6): 40 mg via INTRAVENOUS
  Filled 2017-07-04 (×6): qty 4

## 2017-07-04 MED ORDER — ACETAMINOPHEN 325 MG PO TABS: 650.0000 mg | ORAL_TABLET | Freq: Four times a day (QID) | ORAL | Status: DC | PRN

## 2017-07-04 MED ORDER — ATORVASTATIN CALCIUM 10 MG PO TABS
10.0000 mg | ORAL_TABLET | Freq: Every day | ORAL | Status: DC
  Administered 2017-07-06 – 2017-07-11 (×5): 10 mg via ORAL
  Filled 2017-07-04 (×8): qty 1

## 2017-07-04 MED ORDER — COLCHICINE 0.6 MG PO TABS
0.6000 mg | ORAL_TABLET | Freq: Every day | ORAL | Status: DC
  Administered 2017-07-05 – 2017-07-11 (×7): 0.6 mg via ORAL
  Filled 2017-07-04 (×8): qty 1

## 2017-07-04 MED ORDER — PRENATAL PLUS 27-1 MG PO TABS
1.0000 | ORAL_TABLET | Freq: Every day | ORAL | Status: DC
  Administered 2017-07-06 – 2017-07-11 (×6): 1 via ORAL
  Filled 2017-07-04 (×7): qty 1

## 2017-07-04 MED ORDER — SODIUM BICARBONATE 650 MG PO TABS
1300.0000 mg | ORAL_TABLET | Freq: Two times a day (BID) | ORAL | Status: DC
  Administered 2017-07-04 – 2017-07-08 (×8): 1300 mg via ORAL
  Filled 2017-07-04 (×8): qty 2

## 2017-07-04 MED ORDER — INSULIN ASPART 100 UNIT/ML ~~LOC~~ SOLN: 0.0000 [IU] | Freq: Three times a day (TID) | SUBCUTANEOUS | Status: DC

## 2017-07-04 MED ORDER — ONDANSETRON HCL 4 MG/2ML IJ SOLN: 4.0000 mg | Freq: Four times a day (QID) | INTRAMUSCULAR | Status: DC | PRN

## 2017-07-04 MED ORDER — SODIUM CHLORIDE 0.9 % IV SOLN
1.0000 g | INTRAVENOUS | Status: AC
  Administered 2017-07-05 – 2017-07-10 (×6): 1 g via INTRAVENOUS
  Filled 2017-07-04 (×6): qty 10

## 2017-07-04 MED ORDER — GABAPENTIN 600 MG PO TABS
600.0000 mg | ORAL_TABLET | Freq: Two times a day (BID) | ORAL | Status: DC
  Administered 2017-07-05: 600 mg via ORAL
  Filled 2017-07-04: qty 1

## 2017-07-04 MED ORDER — ACETAMINOPHEN 650 MG RE SUPP: 650.0000 mg | Freq: Four times a day (QID) | RECTAL | Status: DC | PRN

## 2017-07-04 MED ORDER — HEPARIN SODIUM (PORCINE) 5000 UNIT/ML IJ SOLN
5000.0000 [IU] | Freq: Three times a day (TID) | INTRAMUSCULAR | Status: DC
  Administered 2017-07-04 – 2017-07-11 (×17): 5000 [IU] via SUBCUTANEOUS
  Filled 2017-07-04 (×17): qty 1

## 2017-07-04 MED ORDER — SODIUM CHLORIDE 0.9 % IV SOLN
500.0000 mg | INTRAVENOUS | Status: DC
  Administered 2017-07-05 – 2017-07-06 (×2): 500 mg via INTRAVENOUS
  Filled 2017-07-04 (×2): qty 500

## 2017-07-04 MED ORDER — ONDANSETRON HCL 4 MG PO TABS: 4.0000 mg | ORAL_TABLET | Freq: Four times a day (QID) | ORAL | Status: DC | PRN

## 2017-07-04 NOTE — ED Notes (Signed)
Pt unable to ambulate with this RN

## 2017-07-04 NOTE — H&P (Addendum)
History and Physical    Marc [name] UYQ:[id number] DOB: 05-Nov-1940 DOA: 07/04/2017  PCP: [name], MD  Patient coming from: Home.  Chief Complaint: Fall.  Left-sided chest pain.  HPI: Marc [name] is a 77 y.o. male with history of CAD, chronic kidney disease, diabetes mellitus, CHF, hypertension presents to the ER the patient has benign persistent left-sided chest pain after fall 5 days ago.  Denies hitting his head or losing consciousness.  Patient states he was walking when he suddenly slipped and fell.  Since the fall patient also has not taken his Lasix since he was unable to make it to the bathroom.  He has noticed increasing swelling in the lower extremity since fall.  Also patient developed decubitus ulcer in the sacral area.  ED Course: CT scan chest and abdomen done shows rib fractures involving the fifth through 12 3.  Trauma service at this time requested hospitalist admission.  On exam patient has bilateral lower extremity edema.  CT scan of the chest in addition shows possibility of pneumonia.  Patient admitted for further management of pain due to rib fracture and also increasing lower extremity edema secondary to decompensated CHF and possible pneumonia.  Review of Systems: As per HPI, rest all negative.   Past Medical History:  Diagnosis Date  . Anemia    low iron  . ARF (acute renal failure) ([place]) 12/2014  . Arthritis   . Bradycardia   . Chronic bronchitis ([place])   . CKD (chronic kidney disease), stage III ([place])   . Colon polyp   . Depression    occassional.  Situational  . GERD (gastroesophageal reflux disease)   . Gout   . Gout   . History of blood transfusion   . History of GI bleed 2007  . Hypercholesteremia   . Hypertension   . Neuropathy   . PAD (peripheral artery disease) ([place])   . Pneumonia    denies  . Tobacco abuse disorder 03/10/2012  . Type II diabetes mellitus ([place])   . Ulcer disease     Past Surgical History:  Procedure  Laterality Date  . AMPUTATION Right 02/07/2015   Procedure: GREAT TOE RAY AMPUTATION RIGHT ;  Surgeon: Angelia [name], MD;  Location: [place];  Service: Vascular;  Laterality: Right;  . AMPUTATION TOE Right 03/21/2015   Procedure: AMPUTATION TOE-RIGHT SECOND TOE;  Surgeon: Angelia [name], MD;  Location: [place];  Service: Vascular;  Laterality: Right;  . ANTERIOR CERVICAL DECOMP/DISCECTOMY FUSION  ~ 2007  . ANTERIOR LAT LUMBAR FUSION Left 01/19/2014   Procedure: LATERAL INTERBODY FUSION 1 LEVEL;  Surgeon: Sinclair Ship, MD;  Location: [place];  Service: Orthopedics;  Laterality: Left;  Left lumbar 3-4 lateral interbody fusion with instrumentation, allograft  . APPLICATION OF WOUND VAC Right 02/07/2015   Procedure:  APPLICATION OF WOUND VAC right great toe amputation site.;  Surgeon: Angelia [name], MD;  Location: [place];  Service: Vascular;  Laterality: Right;  . APPLICATION OF WOUND VAC Right 03/21/2015   Procedure: APPLICATION OF WOUND VAC RIGHT FOOT;  Surgeon: Angelia [name], MD;  Location: [place];  Service: Vascular;  Laterality: Right;  . BACK SURGERY    . BYPASS GRAFT FEMORAL-PERONEAL Right 02/07/2015   Procedure: BYPASS GRAFT FEMORAL-PERONEAL Trunk WITH VEIN graft  right leg.;  Surgeon: Angelia [name], MD;  Location: [place];  Service: Vascular;  Laterality: Right;  . COLONOSCOPY    . ENDARTERECTOMY TIBIOPERONEAL Right 02/07/2015  Procedure: ENDARTERECTOMY TIBIOPERONEAL;  Surgeon: Angelia [name], MD;  Location: [place];  Service: Vascular;  Laterality: Right;  . EXCISIONAL TOTAL KNEE ARTHROPLASTY WITH ANTIBIOTIC SPACERS Right 04/14/2015   Procedure: RIGHT KNEE [place] OUT AND PLACEMENT OF DRAINS;  Surgeon: Frederik Pear, MD;  Location: [place];  Service: Orthopedics;  Laterality: Right;  . EYE SURGERY Bilateral    cataract surgery with lens implant  . HERNIA REPAIR  ~ 2007   UHR (02/04/2012)  . ILIAC ARTERY STENT Left    left common/notes (02/04/2012)  .  INTRAOPERATIVE ARTERIOGRAM Right 02/07/2015   Procedure: INTRA OPERATIVE ARTERIOGRAM;  Surgeon: Angelia [name], MD;  Location: [place];  Service: Vascular;  Laterality: Right;  . IRRIGATION AND DEBRIDEMENT KNEE Right 04/14/2015   Procedure: IRRIGATION AND DEBRIDEMENT KNEE;  Surgeon: Frederik Pear, MD;  Location: [place];  Service: Orthopedics;  Laterality: Right;  . LOWER EXTREMITY ANGIOGRAM N/A 02/04/2012   Procedure: LOWER EXTREMITY ANGIOGRAM;  Surgeon: Lorretta Harp, MD;  Location: [facility] CATH LAB;  Service: Cardiovascular;  Laterality: N/A;  . LUMBAR LAMINECTOMY  11/14  . PATCH ANGIOPLASTY Right 02/07/2015   Procedure: Vein PATCH ANGIOPLASTY to tibioperoneal trunk;  Surgeon: Angelia [name], MD;  Location: [place];  Service: Vascular;  Laterality: Right;  . PERCUTANEOUS STENT INTERVENTION Left 02/04/2012   Procedure: PERCUTANEOUS STENT INTERVENTION;  Surgeon: Lorretta Harp, MD;  Location: [facility] CATH LAB;  Service: Cardiovascular;  Laterality: Left;  lt ext iliac stent  . PERIPHERAL VASCULAR CATHETERIZATION N/A 02/02/2015   Procedure: Abdominal Aortogram w/Lower Extremity;  Surgeon: Angelia [name], MD;  Location: [place] [facility];  Service: Cardiovascular;  Laterality: N/A;  . POLYPECTOMY    . REPLACEMENT TOTAL KNEE Right   . SPINAL CORD STIMULATOR INSERTION N/A 09/20/2016   Procedure: LUMBAR SPINAL CORD STIMULATOR INSERTION;  Surgeon: Clydell Hakim, MD;  Location: [place];  Service: Neurosurgery;  Laterality: N/A;  LUMBAR SPINAL CORD STIMULATOR INSERTION  . SPLENECTOMY  ~ 1957  . TONSILLECTOMY  ~ 1947  . TOTAL KNEE ARTHROPLASTY Right 02/03/2013   Procedure: TOTAL KNEE ARTHROPLASTY;  Surgeon: [name], MD;  Location: [place];  Service: Orthopedics;  Laterality: Right;  . UPPER ENDOSCOPY W/ SCLEROTHERAPY  ~ 2007  . WOUND DEBRIDEMENT Right 03/21/2015   Procedure: DEBRIDEMENT WOUND of RIGHT FOOT;  Surgeon: Angelia [name], MD;  Location: [place];  Service: Vascular;  Laterality: Right;      reports that he has been smoking cigarettes.  He has a 58.00 pack-year smoking history. He has quit using smokeless tobacco. His smokeless tobacco use included chew. He reports that he does not drink alcohol or use drugs.  Allergies  Allergen Reactions  . Actos [Pioglitazone] Swelling and Other (See Comments)    LOWER EXTREMITY EDEMA     Family History  Problem Relation Age of Onset  . Colon cancer Paternal Uncle        Uncle  . Heart disease Father   . Hypertension Father   . Heart attack Father   . Heart disease Mother   . Diabetes Mother   . Hypertension Mother     Prior to Admission medications   Medication Sig Start Date End Date Taking? Authorizing Provider  allopurinol (ZYLOPRIM) 300 MG tablet Take 300 mg by mouth at bedtime.    Yes [provider]  atorvastatin (LIPITOR) 10 MG tablet Take 10 mg by mouth daily.   Yes [provider]  clopidogrel (PLAVIX) 75 MG tablet Take 75  mg by mouth every other day.    Yes [provider]  colchicine 0.6 MG tablet Take 0.6 mg by mouth daily.   Yes [provider]  docusate sodium (COLACE) 100 MG capsule Take 100 mg by mouth 2 (two) times daily.   Yes [provider]  furosemide (LASIX) 80 MG tablet Take 1 tablet (80 mg total) by mouth daily for 5 days. 03/25/17 07/04/17 Yes Reyne Dumas, MD  gabapentin (NEURONTIN) 600 MG tablet Take 1,200 mg by mouth every 12 (twelve) hours.    Yes [provider]  lisinopril (PRINIVIL,ZESTRIL) 20 MG tablet Take 1 tablet (20 mg total) by mouth daily. 03/25/17  Yes Reyne Dumas, MD  oxyCODONE (ROXICODONE) 15 MG immediate release tablet Take 15 mg by mouth every 6 (six) hours as needed for pain.  01/31/17  Yes [provider]  predniSONE (DELTASONE) 10 MG tablet Take 10 mg by mouth daily.  02/27/17  Yes [provider]  Prenatal Vit-Fe Fumarate-FA (MULTIVITAMIN-PRENATAL) 27-0.8 MG TABS tablet Take 1 tablet by mouth daily at 12 noon.    Yes [provider]  sodium bicarbonate 650 MG tablet Take 2 tablets (1,300 mg total) by mouth 2 (two) times daily. 03/11/17  Yes Reyne Dumas, MD  metoprolol tartrate (LOPRESSOR) 25 MG tablet Take 0.5 tablets (12.5 mg total) by mouth 2 (two) times daily. Patient not taking: Reported on 07/04/2017 03/11/17   Reyne Dumas, MD  senna-docusate (SENOKOT-S) 8.6-50 MG tablet Take 1 tablet by mouth at bedtime as needed for mild constipation. Patient not taking: Reported on 07/04/2017 03/11/17   Reyne Dumas, MD    Physical Exam: Vitals:   07/04/17 2045 07/04/17 2115 07/04/17 2130 07/04/17 2145  BP: 106/76 126/63 (!) 125/59 (!) 117/56  Pulse: 63 80 77 (!) 57  Resp: 17 15 10 13   Temp:      TempSrc:      SpO2: 98% 95% 96% 99%  Weight:      Height:          Constitutional: Moderately built and nourished. Vitals:   07/04/17 2045 07/04/17 2115 07/04/17 2130 07/04/17 2145  BP: 106/76 126/63 (!) 125/59 (!) 117/56  Pulse: 63 80 77 (!) 57  Resp: 17 15 10 13   Temp:      TempSrc:      SpO2: 98% 95% 96% 99%  Weight:      Height:       Eyes: Anicteric no pallor. ENMT: No discharge from the ears eyes nose or mouth. Neck: No mass felt.  No neck rigidity.  JVD mildly elevated. Respiratory: No rhonchi or crepitations. Cardiovascular: S1-S2 heard no murmurs appreciated. Abdomen: Soft nontender bowel sounds present. Musculoskeletal: Bilateral lower extremity edema. Skin: Decubitus ulcer stage III on the sacral area. Neurologic: Alert awake oriented to time place and person.  Moves all extremities. Psychiatric: Appears normal.  Normal affect.   Labs on Admission: I have personally reviewed following labs and imaging studies  CBC: Recent Labs  Lab 07/04/17 1428  WBC 9.3  NEUTROABS 7.9*  HGB 11.9*  HCT 35.9*  MCV 97.3  PLT 932   Basic Metabolic Panel: Recent Labs  Lab 07/04/17 1428  NA 137  K 4.3  CL 97*  CO2 24  GLUCOSE 342*  BUN 73*  CREATININE 2.95*  CALCIUM 9.5    GFR: Estimated Creatinine Clearance: 23.4 mL/min (A) (by C-G formula based on SCr of 2.95 mg/dL (H)). Liver Function Tests: Recent Labs  Lab 07/04/17 1428  AST 18  ALT 8*  ALKPHOS 152*  BILITOT 0.8  PROT 7.4  ALBUMIN 3.1*   No results for input(s): LIPASE, AMYLASE in the last 168 hours. Recent Labs  Lab 07/04/17 1428  AMMONIA 26   Coagulation Profile: Recent Labs  Lab 07/04/17 1428  INR 1.21   Cardiac Enzymes: Recent Labs  Lab 07/04/17 1428  CKTOTAL 106  TROPONINI <0.03   BNP (last 3 results) No results for input(s): PROBNP in the last 8760 hours. HbA1C: No results for input(s): HGBA1C in the last 72 hours. CBG: Recent Labs  Lab 07/04/17 1308  GLUCAP 336*   Lipid Profile: No results for input(s): CHOL, HDL, LDLCALC, TRIG, CHOLHDL, LDLDIRECT in the last 72 hours. Thyroid Function Tests: No results for input(s): TSH, T4TOTAL, FREET4, T3FREE, THYROIDAB in the last 72 hours. Anemia Panel: No results for input(s): VITAMINB12, FOLATE, FERRITIN, TIBC, IRON, RETICCTPCT in the last 72 hours. Urine analysis:    Component Value Date/Time   COLORURINE YELLOW 07/04/2017 1812   APPEARANCEUR HAZY (A) 07/04/2017 1812   LABSPEC 1.013 07/04/2017 1812   PHURINE 5.0 07/04/2017 1812   GLUCOSEU 50 (A) 07/04/2017 1812   HGBUR MODERATE (A) 07/04/2017 1812   BILIRUBINUR NEGATIVE 07/04/2017 1812   KETONESUR NEGATIVE 07/04/2017 1812   PROTEINUR NEGATIVE 07/04/2017 1812   UROBILINOGEN 1.0 01/12/2014 1004   NITRITE NEGATIVE 07/04/2017 1812   LEUKOCYTESUR NEGATIVE 07/04/2017 1812   Sepsis Labs: @LABRCNTIP (procalcitonin:4,lacticidven:4) )No results found for this or any previous visit (from the past 240 hour(s)).   Radiological Exams on Admission: Ct Abdomen Pelvis Wo Contrast  Result Date: 07/04/2017 CLINICAL DATA:  Back pain after fall 5 days ago. EXAM: CT CHEST, ABDOMEN AND PELVIS WITHOUT CONTRAST TECHNIQUE: Multidetector CT imaging of the chest, abdomen and pelvis was  performed following the standard protocol without IV contrast. COMPARISON:  CT chest dated April 23, 2017. CT lumbar myelogram dated May 30, 2016. PET-CT dated July 19, 2014. FINDINGS: CT CHEST FINDINGS Cardiovascular: Normal heart size. No pericardial effusion. 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 Coronary, aortic arch, and branch vessel atherosclerotic vascular disease. Mediastinum/Nodes: No enlarged mediastinal, hilar, or axillary lymph nodes. Thyroid gland, trachea, and esophagus demonstrate no significant findings. Lungs/Pleura: There are a few patchy ground-glass densities in the subpleural posterior right upper lobe. Right lower lobe atelectasis. Unchanged round atelectasis in the left lower lobe. No pleural effusion or pneumothorax. Saber sheath trachea, mild centrilobular and paraseptal emphysema, diffuse bronchial wall thickening are again noted. Stable scattered calcified pleural plaques bilaterally. Musculoskeletal: Acute, mildly displaced fractures of the left lateral fifth through eleventh ribs. Nondisplaced fracture of the posterior left eleventh and twelfth ribs. Unchanged spinal stimulator terminating in the posterior midthoracic spinal canal. CT ABDOMEN PELVIS FINDINGS Hepatobiliary: No focal liver abnormality is seen. No gallstones, gallbladder wall thickening, or biliary dilatation. Pancreas: Mild atrophy. No ductal dilatation or surrounding inflammatory changes. Spleen: Normal in size without focal abnormality. Adrenals/Urinary Tract: Adrenal glands are unremarkable. Kidneys are normal, without renal calculi, focal lesion, or hydronephrosis. Bladder is unremarkable. Stomach/Bowel: Stomach is within normal limits. Appendix appears normal. No evidence of bowel wall thickening, distention, or inflammatory changes. Moderate stool ball in the rectum. Vascular/Lymphatic: Aortic atherosclerosis. Partially visualized right femoral bypass graft. No enlarged  abdominal or pelvic lymph nodes. Reproductive: Prostatomegaly with central gland hypertrophy indenting the bladder base. Other: No abdominal wall hernia or abnormality. No abdominopelvic ascites. No pneumoperitoneum. Musculoskeletal: No acute or significant osseous findings. Prior left lateral L3-L4 fusion and L4-L5 posterior fusion.  Severe degenerative disc disease at L2-L3 and L5-S1, unchanged. IMPRESSION: Chest: 1. Acute fractures of the left fifth through twelfth ribs. 2. A few new patchy ground-glass densities in the posterior right upper lobe are likely infectious or inflammatory in etiology. 3.  Emphysema (ICD10-J43.9). 4. 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 Recommend semi-annual imaging followup by CTA or MRA and referral to cardiothoracic surgery if not already obtained. This recommendation follows 2010 ACCF/AHA/AATS/ACR/ASA/SCA/SCAI/SIR/STS/SVM Guidelines for the Diagnosis and Management of Patients With Thoracic Aortic Disease. Circulation. 2010; 121: e266-e369 5.  Aortic atherosclerosis (ICD10-I70.0). Abdomen and pelvis: 1. No evidence of acute traumatic injury within the abdomen or pelvis. 2. Prior L3-L5 fusion.  No acute fracture. Electronically Signed   By: Titus Dubin M.D.   On: 07/04/2017 17:07   Dg Chest 2 View  Result Date: 07/04/2017 CLINICAL DATA:  Back pain following a fall 5 days ago. EXAM: CHEST - 2 VIEW COMPARISON:  03/18/2015 and chest CT dated 04/23/2017. FINDINGS: The cardiac silhouette remains borderline enlarged with a tortuous and calcified thoracic aorta. Neural stimulator lead tips at the level of the midthoracic spine. Clear lungs with stable mild diffuse peribronchial thickening and accentuation of the interstitial markings. The lungs remain mildly hyperexpanded. Thoracic spine degenerative changes. Displaced lateral left 4th, 5th, 6th, 7th, 8th and 9th rib fractures. No pneumothorax. IMPRESSION: 1. Displaced left 4th through 9th  rib fractures without pneumothorax. 2. 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 Electronically Signed   By: Claudie Revering M.D.   On: 07/04/2017 14:20   Dg Lumbar Spine Complete  Result Date: 07/04/2017 CLINICAL DATA:  Back pain EXAM: LUMBAR SPINE - COMPLETE 4+ VIEW COMPARISON:  Lumbar spine CT 05/30/2016 FINDINGS: L3-4 and L4-5 fusion hardware in stable position. Severe disc degeneration at L2-3 with endplate irregularity and retrolisthesis superimposed on disc collapse. These findings are stable from comparison myelography. Degenerative changes are widespread. Dorsal column stimulator in unremarkable position. No demonstrable arthrodesis at L3-4. Prominent osteopenia.  Atherosclerosis with stents. IMPRESSION: No acute finding or change from 2018 myelogram. Advanced degenerative disease. Electronically Signed   By: Monte Fantasia M.D.   On: 07/04/2017 14:24   Ct Chest Wo Contrast  Result Date: 07/04/2017 CLINICAL DATA:  Back pain after fall 5 days ago. EXAM: CT CHEST, ABDOMEN AND PELVIS WITHOUT CONTRAST TECHNIQUE: Multidetector CT imaging of the chest, abdomen and pelvis was performed following the standard protocol without IV contrast. COMPARISON:  CT chest dated April 23, 2017. CT lumbar myelogram dated May 30, 2016. PET-CT dated July 19, 2014. FINDINGS: CT CHEST FINDINGS Cardiovascular: Normal heart size. No pericardial effusion. 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 Coronary, aortic arch, and branch vessel atherosclerotic vascular disease. Mediastinum/Nodes: No enlarged mediastinal, hilar, or axillary lymph nodes. Thyroid gland, trachea, and esophagus demonstrate no significant findings. Lungs/Pleura: There are a few patchy ground-glass densities in the subpleural posterior right upper lobe. Right lower lobe atelectasis. Unchanged round atelectasis in the left lower lobe. No pleural effusion or pneumothorax. Saber sheath trachea, mild centrilobular and  paraseptal emphysema, diffuse bronchial wall thickening are again noted. Stable scattered calcified pleural plaques bilaterally. Musculoskeletal: Acute, mildly displaced fractures of the left lateral fifth through eleventh ribs. Nondisplaced fracture of the posterior left eleventh and twelfth ribs. Unchanged spinal stimulator terminating in the posterior midthoracic spinal canal. CT ABDOMEN PELVIS FINDINGS Hepatobiliary: No focal liver abnormality is seen. No gallstones, gallbladder wall thickening, or biliary dilatation. Pancreas: Mild atrophy. No ductal  dilatation or surrounding inflammatory changes. Spleen: Normal in size without focal abnormality. Adrenals/Urinary Tract: Adrenal glands are unremarkable. Kidneys are normal, without renal calculi, focal lesion, or hydronephrosis. Bladder is unremarkable. Stomach/Bowel: Stomach is within normal limits. Appendix appears normal. No evidence of bowel wall thickening, distention, or inflammatory changes. Moderate stool ball in the rectum. Vascular/Lymphatic: Aortic atherosclerosis. Partially visualized right femoral bypass graft. No enlarged abdominal or pelvic lymph nodes. Reproductive: Prostatomegaly with central gland hypertrophy indenting the bladder base. Other: No abdominal wall hernia or abnormality. No abdominopelvic ascites. No pneumoperitoneum. Musculoskeletal: No acute or significant osseous findings. Prior left lateral L3-L4 fusion and L4-L5 posterior fusion. Severe degenerative disc disease at L2-L3 and L5-S1, unchanged. IMPRESSION: Chest: 1. Acute fractures of the left fifth through twelfth ribs. 2. A few new patchy ground-glass densities in the posterior right upper lobe are likely infectious or inflammatory in etiology. 3.  Emphysema (ICD10-J43.9). 4. 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 Recommend semi-annual imaging followup by CTA or MRA and referral to cardiothoracic surgery if not already obtained. This  recommendation follows 2010 ACCF/AHA/AATS/ACR/ASA/SCA/SCAI/SIR/STS/SVM Guidelines for the Diagnosis and Management of Patients With Thoracic Aortic Disease. Circulation. 2010; 121: [id number] 5.  Aortic atherosclerosis (ICD10-I70.0). Abdomen and pelvis: 1. No evidence of acute traumatic injury within the abdomen or pelvis. 2. Prior L3-L5 fusion.  No acute fracture. Electronically Signed   By: Titus Dubin M.D.   On: 07/04/2017 17:07    EKG: Independently reviewed.  With sinus rhythm with ectopic beats.  QTC prolonged.  Assessment/Plan Principal Problem:   Closed fracture of rib of left side Active Problems:   Peripheral vascular disease with claudication (HCC)   Chronic kidney disease stage 3   CAD (coronary artery disease), native coronary artery   Ascending aortic aneurysm (HCC)   Type 2 diabetes mellitus with renal complication (HCC)   Acute on chronic diastolic CHF (congestive heart failure) (HCC)   Acute CHF (congestive heart failure) ([place])    1. Closed fracture on the left side secondary to mechanical fall -I have placed patient on incentive spirometry and pain relief medications.  May consult trauma service in the morning.  Physical therapy consult. 2. Acute on chronic CHF last EF unknown.  I have ordered 2D echo.  Lasix 40 mg IV every 2 loaded.  Check Dopplers of the lower extremity.  Check troponin. 3. Stage III decubitus ulcer on the sacral area -wound team consult. 4. Possible pneumonia -on empiric antibiotics for now.  Check procalcitonin and if negative may discontinue antibiotics. 5. Hypertension -since patient has chronic kidney disease I am holding off patient's lisinopril and keeping patient on PRN IV hydralazine.  Continue metoprolol. 6. CAD and peripheral vascular disease on metoprolol statins and antiplatelet agents.  Check troponin. 7. Diabetes mellitus type 2 on sliding scale coverage for now. 8. Ascending aortic aneurysm -denies any chest pain. 9. Chronic kidney  disease stage III creatinine appears to be at baseline. -Follow metabolic panel.  Decreasing gabapentin dose due to chronic kidney disease and also patient was mildly lethargic on admission as noticed by ER physician. 10. Anemia likely from renal disease -follow CBC. 11. Prolonged QTC -avoid QTC prolonging medications. 12. 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   DVT prophylaxis: Heparin. Code Status: DNR. Family Communication: Patient's brother. Disposition Plan: May need rehab. Consults called: Wound team physical therapy. Admission status: Inpatient.   Rise Patience MD [facility] Pager [phone].  If 7PM-7AM, please contact night-coverage [email or web address] Password [email or web address]  07/04/2017, 10:09 PM

## 2017-07-04 NOTE — ED Triage Notes (Signed)
Pt arrives via [facility] from home. Pt with fall 5 days ago and has had back pain since. Pt with pain pump on L lower back. Pt states pain unrelieved by pump. Discoloration to bil arms upon arrival. Denies blood thinners.  140/82 P 90 CBG 356 RR 18 87% RA, 93% 2L [place]

## 2017-07-04 NOTE — ED Notes (Signed)
Patient transported to CT 

## 2017-07-04 NOTE — ED Notes (Signed)
Pg provided with urinal. Refusing to allow this RN to help him with the urinal.

## 2017-07-04 NOTE — ED Notes (Signed)
Family reports decreased mobility over the last few weeks along with an increase in falls.

## 2017-07-04 NOTE — ED Notes (Signed)
Admitting MD at bedside.

## 2017-07-04 NOTE — ED Notes (Signed)
Patient transported to X-ray 

## 2017-07-04 NOTE — ED Provider Notes (Signed)
[facility]ERGENCY DEPARTMENT Provider Note   CSN: [id number] Arrival date & time: 07/04/17  1252     History   Chief Complaint Chief Complaint  Patient presents with  . Back Pain    HPI Marc [name] is a 77 y.o. male past medical history of chronic bronchitis, CKD stage III, hyperlipidemia, hypertension, PAD, tobacco abuse, type 2 diabetes, HFpEF, anemia here for back pain and edema.  Patient is a poor historian and is closing his eyes while taking the HPI.  Per patient he fell 5 days ago.  He notes that he slipped on some gravel and fell back and hit his bottom on the ground.  Did not hit his head or lose consciousness.  He notes that he took some indomethacin and that has helped him.  His bigger concern is increasing edema in his feet.  He notes that he is having more trouble walking and his feet are very painful.    His brother is in the room and states that he is having difficulty ambulating secondary to increasing edema and deconditioning.  Apparently patient sits in arm chair 24 hours a day at baseline and does not walk very much.  He has apparently been following more in the last few weeks and is very prone to falling.  He denies any chest pain, shortness of breath, nausea, vomiting, diarrhea, hematochezia, constipation, dysuria or fevers.  HPI  Past Medical History:  Diagnosis Date  . Anemia    low iron  . ARF (acute renal failure) ([place]) 12/2014  . Arthritis   . Bradycardia   . Chronic bronchitis ([place])   . CKD (chronic kidney disease), stage III ([place])   . Colon polyp   . Depression    occassional.  Situational  . GERD (gastroesophageal reflux disease)   . Gout   . Gout   . History of blood transfusion   . History of GI bleed 2007  . Hypercholesteremia   . Hypertension   . Neuropathy   . PAD (peripheral artery disease) ([place])   . Pneumonia    denies  . Tobacco abuse disorder 03/10/2012  . Type II diabetes mellitus ([place])   . Ulcer  disease     Patient Active Problem List   Diagnosis Date Noted  . Chronic pain 03/08/2017  . Antibiotic long-term use 09/14/2015  . 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 Right foot infection 04/13/2015  . Pressure ulcer 03/20/2015  . Chronic diastolic CHF (congestive heart failure) ([place])   . Chronic anemia 03/01/2015  . Type 2 diabetes mellitus with renal complication ([place]) 15/17/6160  . Atherosclerosis of native arteries of extremities with gangrene, right leg ([place]) 02/07/2015  . Acute renal failure superimposed on stage 3 chronic kidney disease ([place]) 01/16/2015  . Type 2 diabetes mellitus with right diabetic foot infection ([place])   . CAD (coronary artery disease), native coronary artery   . Ascending aortic aneurysm ([place])   . Mass of lower lobe of left lung   . Chronic kidney disease stage 3   . Peripheral vascular disease with claudication ([place])   . Gout 02/07/2013  . Hyperlipidemia 02/07/2013  . GERD (gastroesophageal reflux disease) 02/07/2013  . Type 2 diabetes mellitus with peripheral neuropathy ([facility])   . Spinal stenosis of lumbar region 01/14/2013  . Tobacco abuse disorder 03/10/2012  . Bradycardia:  Normal condition for this patient. Previously worked up. 02/05/2012  . Hx of peptic ulcer 05/21/2010  . Hx of adenomatous  colonic polyps 05/21/2010  . Hypertensive heart disease   . Lumbar disc disease     Past Surgical History:  Procedure Laterality Date  . AMPUTATION Right 02/07/2015   Procedure: GREAT TOE RAY AMPUTATION RIGHT ;  Surgeon: Angelia [name], MD;  Location: [facility];  Service: Vascular;  Laterality: Right;  . AMPUTATION TOE Right 03/21/2015   Procedure: AMPUTATION TOE-RIGHT SECOND TOE;  Surgeon: Angelia [name], MD;  Location: [facility];  Service: Vascular;  Laterality: Right;  . ANTERIOR CERVICAL DECOMP/DISCECTOMY FUSION  ~ 2007  . ANTERIOR LAT LUMBAR FUSION Left 01/19/2014   Procedure: LATERAL INTERBODY FUSION 1 LEVEL;  Surgeon: Sinclair Ship, MD;  Location: [facility];  Service: Orthopedics;  Laterality: Left;  Left lumbar 3-4 lateral interbody fusion with instrumentation, allograft  . APPLICATION OF WOUND VAC Right 02/07/2015   Procedure:  APPLICATION OF WOUND VAC right great toe amputation site.;  Surgeon: Angelia [name], MD;  Location: [facility];  Service: Vascular;  Laterality: Right;  . APPLICATION OF WOUND VAC Right 03/21/2015   Procedure: APPLICATION OF WOUND VAC RIGHT FOOT;  Surgeon: Angelia [name], MD;  Location: [facility];  Service: Vascular;  Laterality: Right;  . BACK SURGERY    . BYPASS GRAFT FEMORAL-PERONEAL Right 02/07/2015   Procedure: BYPASS GRAFT FEMORAL-PERONEAL Trunk WITH VEIN graft  right leg.;  Surgeon: Angelia [name], MD;  Location: [facility];  Service: Vascular;  Laterality: Right;  . COLONOSCOPY    . ENDARTERECTOMY TIBIOPERONEAL Right 02/07/2015   Procedure: ENDARTERECTOMY TIBIOPERONEAL;  Surgeon: Angelia [name], MD;  Location: [facility];  Service: Vascular;  Laterality: Right;  . EXCISIONAL TOTAL KNEE ARTHROPLASTY WITH ANTIBIOTIC SPACERS Right 04/14/2015   Procedure: RIGHT KNEE [place] OUT AND PLACEMENT OF DRAINS;  Surgeon: Frederik Pear, MD;  Location: [facility];  Service: Orthopedics;  Laterality: Right;  . EYE SURGERY Bilateral    cataract surgery with lens implant  . HERNIA REPAIR  ~ 2007   UHR (02/04/2012)  . ILIAC ARTERY STENT Left    left common/notes (02/04/2012)  . INTRAOPERATIVE ARTERIOGRAM Right 02/07/2015   Procedure: INTRA OPERATIVE ARTERIOGRAM;  Surgeon: Angelia [name], MD;  Location: [facility];  Service: Vascular;  Laterality: Right;  . IRRIGATION AND DEBRIDEMENT KNEE Right 04/14/2015   Procedure: IRRIGATION AND DEBRIDEMENT KNEE;  Surgeon: Frederik Pear, MD;  Location: [facility];  Service: Orthopedics;  Laterality: Right;  . LOWER EXTREMITY ANGIOGRAM N/A 02/04/2012   Procedure: LOWER EXTREMITY ANGIOGRAM;  Surgeon: Lorretta Harp, MD;  Location: [facility] C[facility] LAB;  Service: Cardiovascular;   Laterality: N/A;  . LUMBAR LAMINECTOMY  11/14  . PATCH ANGIOPLASTY Right 02/07/2015   Procedure: Vein PATCH ANGIOPLASTY to tibioperoneal trunk;  Surgeon: Angelia [name], MD;  Location: [facility];  Service: Vascular;  Laterality: Right;  . PERCUTANEOUS STENT INTERVENTION Left 02/04/2012   Procedure: PERCUTANEOUS STENT INTERVENTION;  Surgeon: Lorretta Harp, MD;  Location: [facility] CATH LAB;  Service: Cardiovascular;  Laterality: Left;  lt ext iliac stent  . PERIPHERAL VASCULAR CATHETERIZATION N/A 02/02/2015   Procedure: Abdominal Aortogram w/Lower Extremity;  Surgeon: Angelia [name], MD;  Location: [facility] CV LAB;  Service: Cardiovascular;  Laterality: N/A;  . POLYPECTOMY    . REPLACEMENT TOTAL KNEE Right   . SPINAL CORD STIMULATOR INSERTION N/A 09/20/2016   Procedure: LUMBAR SPINAL CORD STIMULATOR INSERTION;  Surgeon: Clydell Hakim, MD;  Location: [facility];  Service: Neurosurgery;  Laterality: N/A;  LUMBAR SPINAL CORD STIMULATOR INSERTION  . SPLENECTOMY  ~  [place]  ~ 1947  . TOTAL KNEE ARTHROPLASTY Right 02/03/2013   Procedure: TOTAL KNEE ARTHROPLASTY;  Surgeon: [name], MD;  Location: [facility];  Service: Orthopedics;  Laterality: Right;  . UPPER ENDOSCOPY W/ SCLEROTHERAPY  ~ 2007  . WOUND DEBRIDEMENT Right 03/21/2015   Procedure: DEBRIDEMENT WOUND of RIGHT FOOT;  Surgeon: Angelia [name], MD;  Location: [facility];  Service: Vascular;  Laterality: Right;       Home Medications    Prior to Admission medications   Medication Sig Start Date End Date Taking? Authorizing Provider  allopurinol (ZYLOPRIM) 300 MG tablet Take 300 mg by mouth at bedtime.    Yes [provider]  atorvastatin (LIPITOR) 10 MG tablet Take 10 mg by mouth daily.   Yes [provider]  clopidogrel (PLAVIX) 75 MG tablet Take 75 mg by mouth every other day.    Yes [provider]  colchicine 0.6 MG tablet Take 0.6 mg by mouth daily.   Yes [provider]  docusate  sodium (COLACE) 100 MG capsule Take 100 mg by mouth 2 (two) times daily.   Yes [provider]  furosemide (LASIX) 80 MG tablet Take 1 tablet (80 mg total) by mouth daily for 5 days. 03/25/17 07/04/17 Yes Reyne Dumas, MD  gabapentin (NEURONTIN) 600 MG tablet Take 1,200 mg by mouth every 12 (twelve) hours.    Yes [provider]  lisinopril (PRINIVIL,ZESTRIL) 20 MG tablet Take 1 tablet (20 mg total) by mouth daily. 03/25/17  Yes Reyne Dumas, MD  oxyCODONE (ROXICODONE) 15 MG immediate release tablet Take 15 mg by mouth every 6 (six) hours as needed for pain.  01/31/17  Yes [provider]  predniSONE (DELTASONE) 10 MG tablet Take 10 mg by mouth daily.  02/27/17  Yes [provider]  Prenatal Vit-Fe Fumarate-FA (MULTIVITAMIN-PRENATAL) 27-0.8 MG TABS tablet Take 1 tablet by mouth daily at 12 noon.   Yes [provider]  sodium bicarbonate 650 MG tablet Take 2 tablets (1,300 mg total) by mouth 2 (two) times daily. 03/11/17  Yes Reyne Dumas, MD  metoprolol tartrate (LOPRESSOR) 25 MG tablet Take 0.5 tablets (12.5 mg total) by mouth 2 (two) times daily. Patient not taking: Reported on 07/04/2017 03/11/17   Reyne Dumas, MD  senna-docusate (SENOKOT-S) 8.6-50 MG tablet Take 1 tablet by mouth at bedtime as needed for mild constipation. Patient not taking: Reported on 07/04/2017 03/11/17   Reyne Dumas, MD    Family History Family History  Problem Relation Age of Onset  . Colon cancer Paternal Uncle        Uncle  . Heart disease Father   . Hypertension Father   . Heart attack Father   . Heart disease Mother   . Diabetes Mother   . Hypertension Mother     Social History Social History   Tobacco Use  . Smoking status: Current Every Day Smoker    Packs/day: 1.00    Years: 58.00    Pack years: 58.00    Types: Cigarettes  . Smokeless tobacco: Former Systems developer    Types: Chew  . Tobacco comment: 1-2 pks per day.   Substance Use Topics  . Alcohol use: No     Alcohol/week: 0.0 oz  . Drug use: No     Allergies   Actos [pioglitazone]   Review of Systems Review of Systems: Per HPI. Otherwise negative   Physical Exam Updated Vital Signs BP 106/66   Pulse 72  Temp 98.4 F (36.9 C) (Oral)   Resp 16   Ht 6\' 1"  (1.854 m)   Wt 78.9 kg (174 lb)   SpO2 99%   BMI 22.96 kg/m   Physical Exam  Constitutional: No distress.  Deconditioned appearing male laying in bed, intermittently closing eyes in between questioning.  Poor historian.   HENT:  Head: Normocephalic.  Nose: Nose normal.  Dry mucous membrane  Eyes: Pupils are equal, round, and reactive to light.  Mild scleral icterus  Neck: Normal range of motion. No JVD present.  Cardiovascular: Regular rhythm.  Tachycardic  Pulmonary/Chest: Effort normal. No accessory muscle usage or stridor. No tachypnea. No respiratory distress. He has decreased breath sounds in the right lower field and the left lower field. He has no rhonchi. He exhibits tenderness (tender to palpation of left lateral chest).  4L O2 via [place]  Abdominal: Soft. He exhibits distension and mass (Mass palpated on the right lower quadrant). There is no tenderness. No hernia.  Musculoskeletal: He exhibits edema (3+ pedal edema, edema extends upwards and stops at the knees).  S/p amputation of 1st and 2nd digits of R foot  Neurological:  Sleepy, opens eyes to stimuli and answers questions intermittently.  Weak upper and lower extremities.  4 out of 5 strength in bilateral upper extremities and 3 out of 5 strength in bilateral lower extremities  Skin: Skin is warm. Capillary refill takes less than 2 seconds.  Hyperpigmentation and ecchymosis seen throughout bilateral forearms.  Bilateral inter gluteal ulcers, stage 2-3  Psychiatric:  Sleepy but mood seems normal       ED Treatments / Results  Labs (all labs ordered are listed, but only abnormal results are displayed) Labs Reviewed  COMPREHENSIVE METABOLIC PANEL -  Abnormal; Notable for the following components:      Result Value   Chloride 97 (*)    Glucose, Bld 342 (*)    BUN 73 (*)    Creatinine, Ser 2.95 (*)    Albumin 3.1 (*)    ALT 8 (*)    Alkaline Phosphatase 152 (*)    GFR calc non Af Amer 19 (*)    GFR calc Af Amer 22 (*)    Anion gap 16 (*)    All other components within normal limits  CBC WITH DIFFERENTIAL/PLATELET - Abnormal; Notable for the following components:   RBC 3.69 (*)    Hemoglobin 11.9 (*)    HCT 35.9 (*)    Neutro Abs 7.9 (*)    All other components within normal limits  URINALYSIS, ROUTINE W REFLEX MICROSCOPIC - Abnormal; Notable for the following components:   APPearance HAZY (*)    Glucose, UA 50 (*)    Hgb urine dipstick MODERATE (*)    Non Squamous Epithelial 0-5 (*)    All other components within normal limits  CBG MONITORING, ED - Abnormal; Notable for the following components:   Glucose-Capillary 336 (*)    All other components within normal limits  I-STAT VENOUS BLOOD GAS, ED - Abnormal; Notable for the following components:   Bicarbonate 28.7 (*)    All other components within normal limits  TROPONIN I  LACTIC ACID, PLASMA  PROTIME-INR  BRAIN NATRIURETIC PEPTIDE  AMMONIA  ETHANOL  RAPID URINE DRUG SCREEN, HOSP PERFORMED  CK  I-STAT TROPONIN, ED  I-STAT CG4 LACTIC ACID, ED    EKG EKG Interpretation  Date/Time:  Friday July 04 2017 13:01:28 EDT Ventricular Rate:  112 PR Interval:    QRS  Duration: 105 QT Interval:  411 QTC Calculation: 554 R Axis:   -69 Text Interpretation:  Sinus or ectopic atrial tachycardia Since last tracing rate faster Confirmed by [facility], [place] [phone]) on 07/04/2017 1:18:06 PM   Radiology Ct Abdomen Pelvis Wo Contrast  Result Date: 07/04/2017 CLINICAL DATA:  Back pain after fall 5 days ago. EXAM: CT CHEST, ABDOMEN AND PELVIS WITHOUT CONTRAST TECHNIQUE: Multidetector CT imaging of the chest, abdomen and pelvis was performed following the standard protocol without  IV contrast. COMPARISON:  CT chest dated April 23, 2017. CT lumbar myelogram dated May 30, 2016. PET-CT dated July 19, 2014. FINDINGS: CT CHEST FINDINGS Cardiovascular: Normal heart size. No pericardial effusion. 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 Coronary, aortic arch, and branch vessel atherosclerotic vascular disease. Mediastinum/Nodes: No enlarged mediastinal, hilar, or axillary lymph nodes. Thyroid gland, trachea, and esophagus demonstrate no significant findings. Lungs/Pleura: There are a few patchy ground-glass densities in the subpleural posterior right upper lobe. Right lower lobe atelectasis. Unchanged round atelectasis in the left lower lobe. No pleural effusion or pneumothorax. Saber sheath trachea, mild centrilobular and paraseptal emphysema, diffuse bronchial wall thickening are again noted. Stable scattered calcified pleural plaques bilaterally. Musculoskeletal: Acute, mildly displaced fractures of the left lateral fifth through eleventh ribs. Nondisplaced fracture of the posterior left eleventh and twelfth ribs. Unchanged spinal stimulator terminating in the posterior midthoracic spinal canal. CT ABDOMEN PELVIS FINDINGS Hepatobiliary: No focal liver abnormality is seen. No gallstones, gallbladder wall thickening, or biliary dilatation. Pancreas: Mild atrophy. No ductal dilatation or surrounding inflammatory changes. Spleen: Normal in size without focal abnormality. Adrenals/Urinary Tract: Adrenal glands are unremarkable. Kidneys are normal, without renal calculi, focal lesion, or hydronephrosis. Bladder is unremarkable. Stomach/Bowel: Stomach is within normal limits. Appendix appears normal. No evidence of bowel wall thickening, distention, or inflammatory changes. Moderate stool ball in the rectum. Vascular/Lymphatic: Aortic atherosclerosis. Partially visualized right femoral bypass graft. No enlarged abdominal or pelvic lymph nodes. Reproductive:  Prostatomegaly with central gland hypertrophy indenting the bladder base. Other: No abdominal wall hernia or abnormality. No abdominopelvic ascites. No pneumoperitoneum. Musculoskeletal: No acute or significant osseous findings. Prior left lateral L3-L4 fusion and L4-L5 posterior fusion. Severe degenerative disc disease at L2-L3 and L5-S1, unchanged. IMPRESSION: Chest: 1. Acute fractures of the left fifth through twelfth ribs. 2. A few new patchy ground-glass densities in the posterior right upper lobe are likely infectious or inflammatory in etiology. 3.  Emphysema (ICD10-J43.9). 4. 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 Recommend semi-annual imaging followup by CTA or MRA and referral to cardiothoracic surgery if not already obtained. This recommendation follows 2010 ACCF/AHA/AATS/ACR/ASA/SCA/SCAI/SIR/STS/SVM Guidelines for the Diagnosis and Management of Patients With Thoracic Aortic Disease. Circulation. 2010; 121: e266-e369 5.  Aortic atherosclerosis (ICD10-I70.0). Abdomen and pelvis: 1. No evidence of acute traumatic injury within the abdomen or pelvis. 2. Prior L3-L5 fusion.  No acute fracture. Electronically Signed   By: Titus Dubin M.D.   On: 07/04/2017 17:07   Dg Chest 2 View  Result Date: 07/04/2017 CLINICAL DATA:  Back pain following a fall 5 days ago. EXAM: CHEST - 2 VIEW COMPARISON:  03/18/2015 and chest CT dated 04/23/2017. FINDINGS: The cardiac silhouette remains borderline enlarged with a tortuous and calcified thoracic aorta. Neural stimulator lead tips at the level of the midthoracic spine. Clear lungs with stable mild diffuse peribronchial thickening and accentuation of the interstitial markings. The lungs remain mildly hyperexpanded. Thoracic spine degenerative changes. Displaced lateral left 4th, 5th,  6th, 7th, 8th and 9th rib fractures. No pneumothorax. IMPRESSION: 1. Displaced left 4th through 9th rib fractures without pneumothorax. 2. Stable  mild changes of COPD and chronic bronchitis. Electronically Signed   By: Claudie Revering M.D.   On: 07/04/2017 14:20   Dg Lumbar Spine Complete  Result Date: 07/04/2017 CLINICAL DATA:  Back pain EXAM: LUMBAR SPINE - COMPLETE 4+ VIEW COMPARISON:  Lumbar spine CT 05/30/2016 FINDINGS: L3-4 and L4-5 fusion hardware in stable position. Severe disc degeneration at L2-3 with endplate irregularity and retrolisthesis superimposed on disc collapse. These findings are stable from comparison myelography. Degenerative changes are widespread. Dorsal column stimulator in unremarkable position. No demonstrable arthrodesis at L3-4. Prominent osteopenia.  Atherosclerosis with stents. IMPRESSION: No acute finding or change from 2018 myelogram. Advanced degenerative disease. Electronically Signed   By: Monte Fantasia M.D.   On: 07/04/2017 14:24   Ct Chest Wo Contrast  Result Date: 07/04/2017 CLINICAL DATA:  Back pain after fall 5 days ago. EXAM: CT CHEST, ABDOMEN AND PELVIS WITHOUT CONTRAST TECHNIQUE: Multidetector CT imaging of the chest, abdomen and pelvis was performed following the standard protocol without IV contrast. COMPARISON:  CT chest dated April 23, 2017. CT lumbar myelogram dated May 30, 2016. PET-CT dated July 19, 2014. FINDINGS: CT CHEST FINDINGS Cardiovascular: Normal heart size. No pericardial effusion. 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 Coronary, aortic arch, and branch vessel atherosclerotic vascular disease. Mediastinum/Nodes: No enlarged mediastinal, hilar, or axillary lymph nodes. Thyroid gland, trachea, and esophagus demonstrate no significant findings. Lungs/Pleura: There are a few patchy ground-glass densities in the subpleural posterior right upper lobe. Right lower lobe atelectasis. Unchanged round atelectasis in the left lower lobe. No pleural effusion or pneumothorax. Saber sheath trachea, mild centrilobular and paraseptal emphysema, diffuse bronchial wall  thickening are again noted. Stable scattered calcified pleural plaques bilaterally. Musculoskeletal: Acute, mildly displaced fractures of the left lateral fifth through eleventh ribs. Nondisplaced fracture of the posterior left eleventh and twelfth ribs. Unchanged spinal stimulator terminating in the posterior midthoracic spinal canal. CT ABDOMEN PELVIS FINDINGS Hepatobiliary: No focal liver abnormality is seen. No gallstones, gallbladder wall thickening, or biliary dilatation. Pancreas: Mild atrophy. No ductal dilatation or surrounding inflammatory changes. Spleen: Normal in size without focal abnormality. Adrenals/Urinary Tract: Adrenal glands are unremarkable. Kidneys are normal, without renal calculi, focal lesion, or hydronephrosis. Bladder is unremarkable. Stomach/Bowel: Stomach is within normal limits. Appendix appears normal. No evidence of bowel wall thickening, distention, or inflammatory changes. Moderate stool ball in the rectum. Vascular/Lymphatic: Aortic atherosclerosis. Partially visualized right femoral bypass graft. No enlarged abdominal or pelvic lymph nodes. Reproductive: Prostatomegaly with central gland hypertrophy indenting the bladder base. Other: No abdominal wall hernia or abnormality. No abdominopelvic ascites. No pneumoperitoneum. Musculoskeletal: No acute or significant osseous findings. Prior left lateral L3-L4 fusion and L4-L5 posterior fusion. Severe degenerative disc disease at L2-L3 and L5-S1, unchanged. IMPRESSION: Chest: 1. Acute fractures of the left fifth through twelfth ribs. 2. A few new patchy ground-glass densities in the posterior right upper lobe are likely infectious or inflammatory in etiology. 3.  Emphysema (ICD10-J43.9). 4. 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 Recommend semi-annual imaging followup by CTA or MRA and referral to cardiothoracic surgery if not already obtained. This recommendation follows 2010  ACCF/AHA/AATS/ACR/ASA/SCA/SCAI/SIR/STS/SVM Guidelines for the Diagnosis and Management of Patients With Thoracic Aortic Disease. Circulation. 2010; 121: [id number] 5.  Aortic atherosclerosis (ICD10-I70.0). Abdomen and pelvis: 1. No evidence of acute traumatic injury within the abdomen  or pelvis. 2. Prior L3-L5 fusion.  No acute fracture. Electronically Signed   By: Titus Dubin M.D.   On: 07/04/2017 17:07    Procedures Procedures (including critical care time)  Medications Ordered in ED Medications  azithromycin (ZITHROMAX) 500 mg in sodium chloride 0.9 % 250 mL IVPB (500 mg Intravenous New Bag/Given 07/04/17 2005)  fentaNYL (SUBLIMAZE) injection 25 mcg (25 mcg Intravenous Given 07/04/17 1924)  cefTRIAXone (ROCEPHIN) 1 g in sodium chloride 0.9 % 100 mL IVPB (0 g Intravenous Stopped 07/04/17 1955)     Initial Impression / Assessment and Plan / ED Course  I have reviewed the triage vital signs and the nursing notes.  Pertinent labs & imaging results that were available during my care of the patient were reviewed by me and considered in my medical decision making (see chart for details).    77 year old male with CKD,HFpEF, anemia, hyperlipidemia, hypertension, PAD, tobacco abuse, type 2 diabetes sent here via EMS for back pain after fall 5 days ago.   On arrival patient sleepy and closing eyes intermittently throughout history taking.  His main complaint was not back pain but was lower extremity edema which has been worsening over the last couple weeks.    On exam he appears deconditioned.  Reassuringly afebrile and normotensive but tachycardic to 110.  He has has 3+ pitting edema from midshin down to his feet. He is not in respiratory distress however he has decreased breath sounds at the bilateral bases. Requiring 4 L of nasal cannula to maintain O2 sat in the 90s (no known previous O2 requirement).  Intragluteal ulcers were also noted.    Chest x-ray showing displaced left 4th through 9th rib  fractures without pneumothorax. EKG showing sinus tachycardia.  Troponin 0.02.  BNP 38.2,  less likely CHF exacerbation.   CT chest and abdomen were ordered without contrast secondary to GFR of 22.  CT chest and showing left-sided rib fractures as well as a few new patchy groundglass densities in the posterior right upper lobe concerning for infectious versus inflammatory etiology.  Patient given 25 mcg of fentanyl for pain control and started on ceftriaxone and azithromycin for presumed early development of community-acquired pneumonia.  Discussed patient with hospitalist, he recommended getting the trauma team involved as well given the rib fractures.  Discussed patient with trauma surgeon Dr. Ninfa Linden who felt it was more appropriate for patient to be admitted to hospitalist team.   Final Clinical Impressions(s) / ED Diagnoses   Final diagnoses:  Fall  Closed fracture of multiple ribs of left side, initial encounter  Community acquired pneumonia of right upper lobe of lung [facility])    ED Discharge Orders    None       Carlyle Dolly, MD 07/04/17 2049    Macarthur Critchley, MD 07/04/17 2105

## 2017-07-05 ENCOUNTER — Inpatient Hospital Stay (HOSPITAL_COMMUNITY): Payer: Medicare Other

## 2017-07-05 ENCOUNTER — Encounter (HOSPITAL_COMMUNITY): Payer: Medicare Other

## 2017-07-05 ENCOUNTER — Other Ambulatory Visit: Payer: Self-pay

## 2017-07-05 DIAGNOSIS — L8990 Pressure ulcer of unspecified site, unspecified stage: Secondary | ICD-10-CM

## 2017-07-05 LAB — RAPID URINE DRUG SCREEN, HOSP PERFORMED
Amphetamines: NOT DETECTED
BARBITURATES: NOT DETECTED
BENZODIAZEPINES: NOT DETECTED
Cocaine: NOT DETECTED
Opiates: POSITIVE — AB
Tetrahydrocannabinol: NOT DETECTED

## 2017-07-05 LAB — BASIC METABOLIC PANEL
Anion gap: 15 (ref 5–15)
BUN: 66 mg/dL — AB (ref 6–20)
CHLORIDE: 97 mmol/L — AB (ref 101–111)
CO2: 26 mmol/L (ref 22–32)
CREATININE: 2.22 mg/dL — AB (ref 0.61–1.24)
Calcium: 9.2 mg/dL (ref 8.9–10.3)
GFR calc Af Amer: 31 mL/min — ABNORMAL LOW (ref >60)
GFR calc non Af Amer: 27 mL/min — ABNORMAL LOW (ref >60)
Glucose, Bld: 241 mg/dL — ABNORMAL HIGH (ref 65–99)
Potassium: 3.9 mmol/L (ref 3.5–5.1)
Sodium: 138 mmol/L (ref 135–145)

## 2017-07-05 LAB — CBC
HCT: 37.4 % — ABNORMAL LOW (ref 39.0–52.0)
Hemoglobin: 12.2 g/dL — ABNORMAL LOW (ref 13.0–17.0)
MCH: 31.9 pg (ref 26.0–34.0)
MCHC: 32.6 g/dL (ref 30.0–36.0)
MCV: 97.9 fL (ref 78.0–100.0)
Platelets: 301 10*3/uL (ref 150–400)
RBC: 3.82 MIL/uL — ABNORMAL LOW (ref 4.22–5.81)
RDW: 13.2 % (ref 11.5–15.5)
WBC: 7.3 10*3/uL (ref 4.0–10.5)

## 2017-07-05 LAB — STREP PNEUMONIAE URINARY ANTIGEN: Strep Pneumo Urinary Antigen: NEGATIVE

## 2017-07-05 LAB — HEPATIC FUNCTION PANEL
ALT: 6 U/L — AB (ref 17–63)
AST: 20 U/L (ref 15–41)
Albumin: 2.8 g/dL — ABNORMAL LOW (ref 3.5–5.0)
Alkaline Phosphatase: 138 U/L — ABNORMAL HIGH (ref 38–126)
BILIRUBIN DIRECT: 0.2 mg/dL (ref 0.1–0.5)
BILIRUBIN INDIRECT: 0.3 mg/dL (ref 0.3–0.9)
BILIRUBIN TOTAL: 0.5 mg/dL (ref 0.3–1.2)
Total Protein: 6.8 g/dL (ref 6.5–8.1)

## 2017-07-05 LAB — GLUCOSE, CAPILLARY
GLUCOSE-CAPILLARY: 240 mg/dL — AB (ref 65–99)
GLUCOSE-CAPILLARY: 225 mg/dL — AB (ref 65–99)
Glucose-Capillary: 156 mg/dL — ABNORMAL HIGH (ref 65–99)
Glucose-Capillary: 201 mg/dL — ABNORMAL HIGH (ref 65–99)

## 2017-07-05 LAB — HEMOGLOBIN A1C
Hgb A1c MFr Bld: 8.9 % — ABNORMAL HIGH (ref 4.8–5.6)
MEAN PLASMA GLUCOSE: 208.73 mg/dL

## 2017-07-05 LAB — MRSA PCR SCREENING: MRSA BY PCR: NEGATIVE

## 2017-07-05 LAB — TROPONIN I: Troponin I: <0.03 ng/mL (ref <0.03)

## 2017-07-05 LAB — PROCALCITONIN: Procalcitonin: 0.41 ng/mL

## 2017-07-05 LAB — MAGNESIUM: Magnesium: 2.3 mg/dL (ref 1.7–2.4)

## 2017-07-05 MED ORDER — LIDOCAINE 5 % EX PTCH
1.0000 | MEDICATED_PATCH | CUTANEOUS | Status: DC
  Administered 2017-07-05 – 2017-07-11 (×7): 1 via TRANSDERMAL
  Filled 2017-07-05 (×7): qty 1

## 2017-07-05 MED ORDER — INSULIN ASPART 100 UNIT/ML ~~LOC~~ SOLN
0.0000 [IU] | Freq: Every day | SUBCUTANEOUS | Status: DC
  Administered 2017-07-05: 2 [IU] via SUBCUTANEOUS
  Administered 2017-07-06: 4 [IU] via SUBCUTANEOUS

## 2017-07-05 MED ORDER — PNEUMOCOCCAL VAC POLYVALENT 25 MCG/0.5ML IJ INJ
0.5000 mL | INJECTION | INTRAMUSCULAR | Status: AC
  Administered 2017-07-07: 0.5 mL via INTRAMUSCULAR
  Filled 2017-07-05: qty 0.5

## 2017-07-05 MED ORDER — INSULIN ASPART 100 UNIT/ML ~~LOC~~ SOLN
0.0000 [IU] | Freq: Three times a day (TID) | SUBCUTANEOUS | Status: DC
  Administered 2017-07-05: 3 [IU] via SUBCUTANEOUS
  Administered 2017-07-05 (×2): 5 [IU] via SUBCUTANEOUS
  Administered 2017-07-06: 2 [IU] via SUBCUTANEOUS
  Administered 2017-07-06: 5 [IU] via SUBCUTANEOUS
  Administered 2017-07-06: 3 [IU] via SUBCUTANEOUS
  Administered 2017-07-07: 2 [IU] via SUBCUTANEOUS

## 2017-07-05 MED ORDER — GABAPENTIN 800 MG PO TABS
700.0000 mg | ORAL_TABLET | Freq: Every day | ORAL | Status: DC
  Administered 2017-07-06: 600 mg via ORAL
  Administered 2017-07-07: 700 mg via ORAL
  Filled 2017-07-05 (×3): qty 0.5

## 2017-07-05 MED ORDER — ACETAMINOPHEN 325 MG PO TABS
650.0000 mg | ORAL_TABLET | Freq: Four times a day (QID) | ORAL | Status: DC
  Administered 2017-07-05 – 2017-07-11 (×24): 650 mg via ORAL
  Filled 2017-07-05 (×24): qty 2

## 2017-07-05 MED ORDER — METHOCARBAMOL 1000 MG/10ML IJ SOLN
500.0000 mg | Freq: Three times a day (TID) | INTRAMUSCULAR | Status: DC | PRN
  Filled 2017-07-05: qty 5

## 2017-07-05 MED ORDER — HYDRALAZINE HCL 20 MG/ML IJ SOLN: 10.0000 mg | INTRAMUSCULAR | Status: DC | PRN

## 2017-07-05 MED ORDER — ORAL CARE MOUTH RINSE
15.0000 mL | Freq: Two times a day (BID) | OROMUCOSAL | Status: DC
Start: 2017-07-05 — End: 2017-07-11
  Administered 2017-07-06 – 2017-07-11 (×10): 15 mL via OROMUCOSAL

## 2017-07-05 MED ORDER — OXYCODONE HCL 5 MG PO TABS
5.0000 mg | ORAL_TABLET | ORAL | Status: DC | PRN
  Administered 2017-07-05 – 2017-07-10 (×8): 5 mg via ORAL
  Filled 2017-07-05 (×9): qty 1

## 2017-07-05 NOTE — Plan of Care (Signed)
  Problem: Clinical Measurements: Goal: Ability to maintain clinical measurements within normal limits will improve Outcome: Progressing   Problem: Clinical Measurements: Goal: Respiratory complications will improve Outcome: Progressing   Problem: Activity: Goal: Risk for activity intolerance will decrease Outcome: Progressing   Problem: Elimination: Goal: Will not experience complications related to bowel motility Outcome: Progressing   Problem: Pain Managment: Goal: General experience of comfort will improve Outcome: Progressing

## 2017-07-05 NOTE — Progress Notes (Signed)
Patient called out complaining of shortness of breath.  Oxygen saturation 83% on room air.  Patient pulled up in bed, placed on 4L oxygen and oxygen saturation 100%, oxygen able to wean to 3L.  Lung sounds clear in the left upper lobe, very diminished/absent in the left lower lobe.  Dr. Florene Glen paged and made aware.  New orders received.  Will continue to monitor.

## 2017-07-05 NOTE — Progress Notes (Signed)
Patient sleeping throughout the day after receiving oxycodone this morning.  Patient will wake up, used incentive spirometer throughout the day.  Patient did refuse to turn and move as ordered.  Will continue to monitor patient.

## 2017-07-05 NOTE — Progress Notes (Signed)
Assessed patient as requested by charge RN d/t earlier episode of low spO2. Patient currently in bed resting. Lung fields diminished bilaterally. Patient shows no outward signs of distress; normal rate, normal depth, no accessory muscle usage. Instructed patient on incentive spirometry use-patient demonstrates good technique. Able to IS 1500 mL x 10 repetitions. Instructed patient to use IS every hour. Patient able to demonstrate strong cough, though c/o pain. Bedside RN reinforced plan with patient-aggressive pulmonary toilet.

## 2017-07-05 NOTE — Progress Notes (Signed)
PROGRESS NOTE    Marc [name]  XBJ:[id number] DOB: [birth year] DOA: 07/04/2017 PCP: [name], MD   Brief Narrative:  Marc [name] is Marc [name] 77 y.o. male with history of CAD, chronic kidney disease, diabetes mellitus, CHF, hypertension presents to the ER the patient has benign persistent left-sided chest pain after fall 5 days ago.  Denies hitting his head or losing consciousness.  Patient states he was walking when he suddenly slipped and fell.  Since the fall patient also has not taken his Lasix since he was unable to make it to the bathroom.  He has noticed increasing swelling in the lower extremity since fall.  Also patient developed decubitus ulcer in the sacral area.  Assessment & Plan:   Principal Problem:   Closed fracture of rib of left side Active Problems:   Peripheral vascular disease with claudication (HCC)   Chronic kidney disease stage 3   CAD (coronary artery disease), native coronary artery   Ascending aortic aneurysm (HCC)   Type 2 diabetes mellitus with renal complication (HCC)   Acute on chronic diastolic CHF (congestive heart failure) (HCC)   Acute CHF (congestive heart failure) (HCC)   Pressure injury of skin   1. Acute Hypoxic Respiratory Failure: likely 2/2 splinting and possible pneumonia as noted on imaging below.  Required O2 to be added this morning. 1. Follow CXR 2. Continue pain control, incentive spirometer 3. Continuous pulse ox 4. Abx as below 5. Wean as tolerated  2. Left 5th through 12th Rib Fractures:  After mechanical fall.   1. Continue IS 2. Fentanyl, oxycodone, scheduled APAP, lidocaine patch, robaxin.  Adjust as needed. 3. Consider trauma consult (discussed over phone today and noted likely nothing they would add - will consider discussing again if problems) 4. Physical therapy, OT  3. Acute on chronic CHF last EF unknown.  With worsening lower extremity edema (R>L which is chronic).  1. Follow echo 2. Continue IV  lasix 3. Follow LE [place] 4. Follow troponin  5. I/O, daily weight  Wt Readings from Last 3 Encounters:  07/05/17 67.1 kg (148 lb)  04/23/17 74.8 kg (165 lb)  03/09/17 76.2 kg (167 lb 15.9 oz)   4. Stage III decubitus ulcer on the sacral area -wound team consult.  Frequent repositioning.  5. Possible CAP:  1. Continue ceftriaxone/azithromycin 2. Sputum cx, blood cx 3. Urine strep, urine legionella 4. Follow procalcitonin  6. Hypertension -since patient has chronic kidney disease I am holding off patient's lisinopril and keeping patient on PRN IV hydralazine.  Continue metoprolol.  7. CAD and peripheral vascular disease on metoprolol statins and antiplatelet agents.  Check troponin.  8. Diabetes mellitus type 2 on sliding scale coverage for now.  May need basal.  Follow A1c  9. Ascending aortic aneurysm -denies any chest pain.  10. Chronic kidney disease stage III creatinine appears to be at baseline.  1. Follow with diuresis above  2. Decreasing gabapentin dose due to chronic kidney disease and also patient was mildly lethargic on admission as noticed by ER physician.   11. Anemia likely from renal disease -follow CBC.  12. Prolonged QTC -avoid QTC prolonging medications.  Repeat EKG  13. 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  14. Thoracic Aorta Aneurysm: needs follow up imaging, CT surgery follow up  DVT prophylaxis: heparin Code Status: dnr  Family Communication: family at bedside Disposition Plan: pending improvement   Consultants:   none  Procedures:   none  Antimicrobials:  Anti-infectives (From admission,  onward)   Start     Dose/Rate Route Frequency Ordered Stop   07/05/17 1000  cefTRIAXone (ROCEPHIN) 1 g in sodium chloride 0.9 % 100 mL IVPB     1 g 200 mL/hr over 30 Minutes Intravenous Every 24 hours 07/04/17 2210 07/12/17 0959   07/05/17 1000  azithromycin (ZITHROMAX) 500 mg in sodium chloride 0.9 % 250 mL IVPB     500 mg 250 mL/hr over 60 Minutes  Intravenous Every 24 hours 07/04/17 2210 07/12/17 0959   07/04/17 1815  azithromycin (ZITHROMAX) 500 mg in sodium chloride 0.9 % 250 mL IVPB     500 mg 250 mL/hr over 60 Minutes Intravenous  Once 07/04/17 1806 07/04/17 2152   07/04/17 1815  cefTRIAXone (ROCEPHIN) 1 g in sodium chloride 0.9 % 100 mL IVPB     1 g 200 mL/hr over 30 Minutes Intravenous  Once 07/04/17 1806 07/04/17 1955         Subjective: Pain on L side. Fell 5 days ago. Worsening LE swelling since then, also sore on bottom and L sided pain.   Objective: Vitals:   07/05/17 0345 07/05/17 0730 07/05/17 0733 07/05/17 0735  BP: 116/66     Pulse: 72     Resp: 11     Temp: 97.8 F (36.6 C)     TempSrc: Oral     SpO2: 98% (!) 83% 100% 98%  Weight: 67.1 kg (148 lb)     Height:        Intake/Output Summary (Last 24 hours) at 07/05/2017 0851 Last data filed at 07/05/2017 0600 Gross per 24 hour  Intake 340 ml  Output 450 ml  Net -110 ml   Filed Weights   07/04/17 1300 07/05/17 0345  Weight: 78.9 kg (174 lb) 67.1 kg (148 lb)    Examination:  General exam: Appears calm and uncomfortable with pain Respiratory system: Clear to auscultation. Decreased breath sound, hard for him to take deep breaths Cardiovascular system: S1 & S2 heard, RRR. No JVD, murmurs, rubs, gallops or clicks. No pedal edema. Gastrointestinal system: Abdomen is nondistended, soft and nontender. No organomegaly or masses felt. Normal bowel sounds heard. Central nervous system: Alert and oriented. No focal neurological deficits. Extremities: R>L LEE Skin: Reviewed previous images by previous providers, but did not turn pt at this time due to pain Psychiatry: Judgement and insight appear normal. Mood & affect appropriate.     Data Reviewed: I have personally reviewed following labs and imaging studies  CBC: Recent Labs  Lab 07/04/17 1428 07/05/17 0702  WBC 9.3 7.3  NEUTROABS 7.9*  --   HGB 11.9* 12.2*  HCT 35.9* 37.4*  MCV 97.3 97.9  PLT  343 416   Basic Metabolic Panel: Recent Labs  Lab 07/04/17 1428  NA 137  K 4.3  CL 97*  CO2 24  GLUCOSE 342*  BUN 73*  CREATININE 2.95*  CALCIUM 9.5   GFR: Estimated Creatinine Clearance: 19.9 mL/min (Magdelene Ruark) (by C-G formula based on SCr of 2.95 mg/dL (H)). Liver Function Tests: Recent Labs  Lab 07/04/17 1428  AST 18  ALT 8*  ALKPHOS 152*  BILITOT 0.8  PROT 7.4  ALBUMIN 3.1*   No results for input(s): LIPASE, AMYLASE in the last 168 hours. Recent Labs  Lab 07/04/17 1428  AMMONIA 26   Coagulation Profile: Recent Labs  Lab 07/04/17 1428  INR 1.21   Cardiac Enzymes: Recent Labs  Lab 07/04/17 1428  CKTOTAL 106  TROPONINI <0.03   BNP (last 3  results) No results for input(s): PROBNP in the last 8760 hours. HbA1C: No results for input(s): HGBA1C in the last 72 hours. CBG: Recent Labs  Lab 07/04/17 1308 07/05/17 0721  GLUCAP 336* 240*   Lipid Profile: No results for input(s): CHOL, HDL, LDLCALC, TRIG, CHOLHDL, LDLDIRECT in the last 72 hours. Thyroid Function Tests: No results for input(s): TSH, T4TOTAL, FREET4, T3FREE, THYROIDAB in the last 72 hours. Anemia Panel: No results for input(s): VITAMINB12, FOLATE, FERRITIN, TIBC, IRON, RETICCTPCT in the last 72 hours. Sepsis Labs: Recent Labs  Lab 07/04/17 1428 07/04/17 1440  LATICACIDVEN 1.7 1.81    Recent Results (from the past 240 hour(s))  MRSA PCR Screening     Status: None   Collection Time: 07/04/17 11:14 PM  Result Value Ref Range Status   MRSA by PCR NEGATIVE NEGATIVE Final    Comment:        The GeneXpert MRSA Assay (FDA approved for NASAL specimens only), is one component of Adi Doro comprehensive MRSA colonization surveillance program. It is not intended to diagnose MRSA infection nor to guide or monitor treatment for MRSA infections. Performed at [facility], [place] [address] [place], [place] [address]          Radiology Studies: Ct Abdomen Pelvis Wo Contrast  Result Date:  07/04/2017 CLINICAL DATA:  Back pain after fall 5 days ago. EXAM: CT CHEST, ABDOMEN AND PELVIS WITHOUT CONTRAST TECHNIQUE: Multidetector CT imaging of the chest, abdomen and pelvis was performed following the standard protocol without IV contrast. COMPARISON:  CT chest dated April 23, 2017. CT lumbar myelogram dated May 30, 2016. PET-CT dated July 19, 2014. FINDINGS: CT CHEST FINDINGS Cardiovascular: Normal heart size. No pericardial effusion. 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 Coronary, aortic arch, and branch vessel atherosclerotic vascular disease. Mediastinum/Nodes: No enlarged mediastinal, hilar, or axillary lymph nodes. Thyroid gland, trachea, and esophagus demonstrate no significant findings. Lungs/Pleura: There are Dorn Hartshorne few patchy ground-glass densities in the subpleural posterior right upper lobe. Right lower lobe atelectasis. Unchanged round atelectasis in the left lower lobe. No pleural effusion or pneumothorax. Saber sheath trachea, mild centrilobular and paraseptal emphysema, diffuse bronchial wall thickening are again noted. Stable scattered calcified pleural plaques bilaterally. Musculoskeletal: Acute, mildly displaced fractures of the left lateral fifth through eleventh ribs. Nondisplaced fracture of the posterior left eleventh and twelfth ribs. Unchanged spinal stimulator terminating in the posterior midthoracic spinal canal. CT ABDOMEN PELVIS FINDINGS Hepatobiliary: No focal liver abnormality is seen. No gallstones, gallbladder wall thickening, or biliary dilatation. Pancreas: Mild atrophy. No ductal dilatation or surrounding inflammatory changes. Spleen: Normal in size without focal abnormality. Adrenals/Urinary Tract: Adrenal glands are unremarkable. Kidneys are normal, without renal calculi, focal lesion, or hydronephrosis. Bladder is unremarkable. Stomach/Bowel: Stomach is within normal limits. Appendix appears normal. No evidence of bowel wall  thickening, distention, or inflammatory changes. Moderate stool ball in the rectum. Vascular/Lymphatic: Aortic atherosclerosis. Partially visualized right femoral bypass graft. No enlarged abdominal or pelvic lymph nodes. Reproductive: Prostatomegaly with central gland hypertrophy indenting the bladder base. Other: No abdominal wall hernia or abnormality. No abdominopelvic ascites. No pneumoperitoneum. Musculoskeletal: No acute or significant osseous findings. Prior left lateral L3-L4 fusion and L4-L5 posterior fusion. Severe degenerative disc disease at L2-L3 and L5-S1, unchanged. IMPRESSION: Chest: 1. Acute fractures of the left fifth through twelfth ribs. 2. Dylin Breeden few new patchy ground-glass densities in the posterior right upper lobe are likely infectious or inflammatory in etiology. 3.  Emphysema (ICD10-J43.9). 4. Stable aneurysmal  dilatation of the ascending thoracic aorta, measuring 4.5 cm. Recommend semi-annual imaging followup by CTA or MRA and referral to cardiothoracic surgery if not already obtained. This recommendation follows 2010 ACCF/AHA/AATS/ACR/ASA/SCA/SCAI/SIR/STS/SVM Guidelines for the Diagnosis and Management of Patients With Thoracic Aortic Disease. Circulation. 2010; 121: e266-e369 5.  Aortic atherosclerosis (ICD10-I70.0). Abdomen and pelvis: 1. No evidence of acute traumatic injury within the abdomen or pelvis. 2. Prior L3-L5 fusion.  No acute fracture. Electronically Signed   By: Titus Dubin M.D.   On: 07/04/2017 17:07   Dg Chest 2 View  Result Date: 07/04/2017 CLINICAL DATA:  Back pain following Matison Nuccio fall 5 days ago. EXAM: CHEST - 2 VIEW COMPARISON:  03/18/2015 and chest CT dated 04/23/2017. FINDINGS: The cardiac silhouette remains borderline enlarged with Ladamien Rammel tortuous and calcified thoracic aorta. Neural stimulator lead tips at the level of the midthoracic spine. Clear lungs with stable mild diffuse peribronchial thickening and accentuation of the interstitial markings. The lungs remain  mildly hyperexpanded. Thoracic spine degenerative changes. Displaced lateral left 4th, 5th, 6th, 7th, 8th and 9th rib fractures. No pneumothorax. IMPRESSION: 1. Displaced left 4th through 9th rib fractures without pneumothorax. 2. 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 Electronically Signed   By: Claudie Revering M.D.   On: 07/04/2017 14:20   Dg Lumbar Spine Complete  Result Date: 07/04/2017 CLINICAL DATA:  Back pain EXAM: LUMBAR SPINE - COMPLETE 4+ VIEW COMPARISON:  Lumbar spine CT 05/30/2016 FINDINGS: L3-4 and L4-5 fusion hardware in stable position. Severe disc degeneration at L2-3 with endplate irregularity and retrolisthesis superimposed on disc collapse. These findings are stable from comparison myelography. Degenerative changes are widespread. Dorsal column stimulator in unremarkable position. No demonstrable arthrodesis at L3-4. Prominent osteopenia.  Atherosclerosis with stents. IMPRESSION: No acute finding or change from 2018 myelogram. Advanced degenerative disease. Electronically Signed   By: Monte Fantasia M.D.   On: 07/04/2017 14:24   Ct Chest Wo Contrast  Result Date: 07/04/2017 CLINICAL DATA:  Back pain after fall 5 days ago. EXAM: CT CHEST, ABDOMEN AND PELVIS WITHOUT CONTRAST TECHNIQUE: Multidetector CT imaging of the chest, abdomen and pelvis was performed following the standard protocol without IV contrast. COMPARISON:  CT chest dated April 23, 2017. CT lumbar myelogram dated May 30, 2016. PET-CT dated July 19, 2014. FINDINGS: CT CHEST FINDINGS Cardiovascular: Normal heart size. No pericardial effusion. 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 Coronary, aortic arch, and branch vessel atherosclerotic vascular disease. Mediastinum/Nodes: No enlarged mediastinal, hilar, or axillary lymph nodes. Thyroid gland, trachea, and esophagus demonstrate no significant findings. Lungs/Pleura: There are Mercedes Valeriano few patchy ground-glass densities in the subpleural  posterior right upper lobe. Right lower lobe atelectasis. Unchanged round atelectasis in the left lower lobe. No pleural effusion or pneumothorax. Saber sheath trachea, mild centrilobular and paraseptal emphysema, diffuse bronchial wall thickening are again noted. Stable scattered calcified pleural plaques bilaterally. Musculoskeletal: Acute, mildly displaced fractures of the left lateral fifth through eleventh ribs. Nondisplaced fracture of the posterior left eleventh and twelfth ribs. Unchanged spinal stimulator terminating in the posterior midthoracic spinal canal. CT ABDOMEN PELVIS FINDINGS Hepatobiliary: No focal liver abnormality is seen. No gallstones, gallbladder wall thickening, or biliary dilatation. Pancreas: Mild atrophy. No ductal dilatation or surrounding inflammatory changes. Spleen: Normal in size without focal abnormality. Adrenals/Urinary Tract: Adrenal glands are unremarkable. Kidneys are normal, without renal calculi, focal lesion, or hydronephrosis. Bladder is unremarkable. Stomach/Bowel: Stomach is within normal limits. Appendix appears normal. No evidence of bowel wall thickening, distention, or  inflammatory changes. Moderate stool ball in the rectum. Vascular/Lymphatic: Aortic atherosclerosis. Partially visualized right femoral bypass graft. No enlarged abdominal or pelvic lymph nodes. Reproductive: Prostatomegaly with central gland hypertrophy indenting the bladder base. Other: No abdominal wall hernia or abnormality. No abdominopelvic ascites. No pneumoperitoneum. Musculoskeletal: No acute or significant osseous findings. Prior left lateral L3-L4 fusion and L4-L5 posterior fusion. Severe degenerative disc disease at L2-L3 and L5-S1, unchanged. IMPRESSION: Chest: 1. Acute fractures of the left fifth through twelfth ribs. 2. Veralyn Lopp few new patchy ground-glass densities in the posterior right upper lobe are likely infectious or inflammatory in etiology. 3.  Emphysema (ICD10-J43.9). 4. Stable  aneurysmal dilatation of the ascending thoracic aorta, measuring 4.5 cm. Recommend semi-annual imaging followup by CTA or MRA and referral to cardiothoracic surgery if not already obtained. This recommendation follows 2010 ACCF/AHA/AATS/ACR/ASA/SCA/SCAI/SIR/STS/SVM Guidelines for the Diagnosis and Management of Patients With Thoracic Aortic Disease. Circulation. 2010; 121: [id number] 5.  Aortic atherosclerosis (ICD10-I70.0). Abdomen and pelvis: 1. No evidence of acute traumatic injury within the abdomen or pelvis. 2. Prior L3-L5 fusion.  No acute fracture. Electronically Signed   By: Titus Dubin M.D.   On: 07/04/2017 17:07        Scheduled Meds: . allopurinol  300 mg Oral QHS  . atorvastatin  10 mg Oral Daily  . clopidogrel  75 mg Oral QODAY  . colchicine  0.6 mg Oral Daily  . docusate sodium  100 mg Oral BID  . furosemide  40 mg Intravenous Q12H  . gabapentin  600 mg Oral BID  . heparin  5,000 Units Subcutaneous Q8H  . insulin aspart  0-15 Units Subcutaneous TID WC  . insulin aspart  0-5 Units Subcutaneous QHS  . lidocaine  1 patch Transdermal Q24H  . [START ON 07/06/2017] pneumococcal 23 valent vaccine  0.5 mL Intramuscular Tomorrow-1000  . predniSONE  10 mg Oral Q breakfast  . prenatal vitamin w/FE, FA  1 tablet Oral Q1200  . sodium bicarbonate  1,300 mg Oral BID   Continuous Infusions: . azithromycin    . cefTRIAXone (ROCEPHIN)  IV       LOS: 1 day    Time spent: over 30 min    Fayrene Helper, MD [facility] Pager [phone]  If 7PM-7AM, please contact night-coverage [email or web address] Password [facility] 07/05/2017, 8:51 AM

## 2017-07-06 ENCOUNTER — Inpatient Hospital Stay (HOSPITAL_COMMUNITY): Payer: Medicare Other

## 2017-07-06 DIAGNOSIS — I2510 Atherosclerotic heart disease of native coronary artery without angina pectoris: Secondary | ICD-10-CM

## 2017-07-06 DIAGNOSIS — R609 Edema, unspecified: Secondary | ICD-10-CM

## 2017-07-06 DIAGNOSIS — I5033 Acute on chronic diastolic (congestive) heart failure: Secondary | ICD-10-CM

## 2017-07-06 LAB — GLUCOSE, CAPILLARY
GLUCOSE-CAPILLARY: 245 mg/dL — AB (ref 65–99)
GLUCOSE-CAPILLARY: 152 mg/dL — AB (ref 65–99)
Glucose-Capillary: 141 mg/dL — ABNORMAL HIGH (ref 65–99)
Glucose-Capillary: 341 mg/dL — ABNORMAL HIGH (ref 65–99)

## 2017-07-06 LAB — MAGNESIUM: MAGNESIUM: 2.1 mg/dL (ref 1.7–2.4)

## 2017-07-06 LAB — COMPREHENSIVE METABOLIC PANEL
ALT: 8 U/L — ABNORMAL LOW (ref 17–63)
AST: 16 U/L (ref 15–41)
Albumin: 2.6 g/dL — ABNORMAL LOW (ref 3.5–5.0)
Alkaline Phosphatase: 112 U/L (ref 38–126)
Anion gap: 10 (ref 5–15)
BILIRUBIN TOTAL: 0.6 mg/dL (ref 0.3–1.2)
BUN: 63 mg/dL — AB (ref 6–20)
CO2: 30 mmol/L (ref 22–32)
CREATININE: 2.11 mg/dL — AB (ref 0.61–1.24)
Calcium: 8.7 mg/dL — ABNORMAL LOW (ref 8.9–10.3)
Chloride: 97 mmol/L — ABNORMAL LOW (ref 101–111)
GFR, EST AFRICAN AMERICAN: 33 mL/min — AB (ref >60)
GFR, EST NON AFRICAN AMERICAN: 29 mL/min — AB (ref >60)
Glucose, Bld: 244 mg/dL — ABNORMAL HIGH (ref 65–99)
POTASSIUM: 3.6 mmol/L (ref 3.5–5.1)
Sodium: 137 mmol/L (ref 135–145)
TOTAL PROTEIN: 5.9 g/dL — AB (ref 6.5–8.1)

## 2017-07-06 LAB — CBC
HEMATOCRIT: 32.5 % — AB (ref 39.0–52.0)
Hemoglobin: 10.5 g/dL — ABNORMAL LOW (ref 13.0–17.0)
MCH: 31.8 pg (ref 26.0–34.0)
MCHC: 32.3 g/dL (ref 30.0–36.0)
MCV: 98.5 fL (ref 78.0–100.0)
Platelets: 300 10*3/uL (ref 150–400)
RBC: 3.3 MIL/uL — ABNORMAL LOW (ref 4.22–5.81)
RDW: 13.2 % (ref 11.5–15.5)
WBC: 5.7 10*3/uL (ref 4.0–10.5)

## 2017-07-06 LAB — ECHOCARDIOGRAM COMPLETE
Height: 73 in
WEIGHTICAEL: 2432 [oz_av]

## 2017-07-06 MED ORDER — AZITHROMYCIN 500 MG PO TABS
500.0000 mg | ORAL_TABLET | Freq: Every day | ORAL | Status: DC
  Administered 2017-07-07 – 2017-07-09 (×3): 500 mg via ORAL
  Filled 2017-07-06 (×3): qty 1

## 2017-07-06 MED ORDER — COLLAGENASE 250 UNIT/GM EX OINT
TOPICAL_OINTMENT | Freq: Every day | CUTANEOUS | Status: DC
  Administered 2017-07-06 – 2017-07-11 (×6): via TOPICAL
  Filled 2017-07-06: qty 30

## 2017-07-06 NOTE — Progress Notes (Signed)
Patient complaining that pain medication isn't strong enough, says he takes oxy 30mg  at home. Patient gets drowsy with pain medication and doesn't like the fact that pain is expected with broken ribs. Says he will call and have someone bring his own pain medications. RN explained that we will try to work with him to manage his pain.

## 2017-07-06 NOTE — Progress Notes (Signed)
Handed EKG to MD, MD aware, no new orders at this time  Placed EKG in chart at this time  Will continue to monitor

## 2017-07-06 NOTE — Progress Notes (Signed)
Educated pt on incentive spirometer  Pt understanding and verbalizes teach back

## 2017-07-06 NOTE — Progress Notes (Signed)
Updated provided to [place], pt brother

## 2017-07-06 NOTE — Evaluation (Signed)
Occupational Therapy Evaluation Patient Details Name: Marc [name] MRN: [id number] DOB: 11-20-1940 Today's Date: 07/06/2017    History of Present Illness Marc [name] is a 77 y.o. male with history of CAD, chronic kidney disease, diabetes mellitus, CHF, hypertension presents to the ER the patient has benign persistent left-sided chest pain after fall 5 days ago.  He has noticed increasing swelling in the lower extremity since fall due to not taking his lasix. Also patient developed decubitus ulcer in the sacral area.   Clinical Impression   PTA, pt was living with his wife and performing ADLs. Pt currently requiring Min A for UB ADLs, Max A for LB ADLs, and Mod A for functional mobility with RW. Pt presenting with decreased activity tolerance, fatigues quickly, limited by pain, and poor strength. Pt would benefit from further acute OT to facilitate safe dc. Recommend dc to SNF for further OT to optimize safety, independence with ADLs, and return to PLOF.      Follow Up Recommendations  SNF    Equipment Recommendations  Other (comment)(Defer to next venue)    Recommendations for Other Services PT consult     Precautions / Restrictions Precautions Precautions: Fall Restrictions Weight Bearing Restrictions: No      Mobility Bed Mobility Overal bed mobility: Needs Assistance Bed Mobility: Sit to Sidelying Rolling: Max assist;Mod assist Sidelying to sit: Max assist     Sit to sidelying: Mod assist General bed mobility comments: Educating pt on log roll technique while holding pillow to decreased pain. Mod A for managing BLEs and bringing over EOB. Pt requesting to stay in sidelying on right side.  Transfers Overall transfer level: Needs assistance Equipment used: Rolling walker (2 wheeled) Transfers: Stand Pivot Transfers;Sit to/from Stand Sit to Stand: Mod assist Stand pivot transfers: Mod assist       General transfer comment: Mod A to power up into standing     Balance Overall balance assessment: Needs assistance Sitting-balance support: Feet supported;No upper extremity supported Sitting balance-Leahy Scale: Fair     Standing balance support: No upper extremity supported Standing balance-Leahy Scale: Poor Standing balance comment: Reliant on UE support                           ADL either performed or assessed with clinical judgement   ADL Overall ADL's : Needs assistance/impaired Eating/Feeding: Set up;Sitting   Grooming: Set up;Sitting   Upper Body Bathing: Minimal assistance;Sitting   Lower Body Bathing: Maximal assistance;Sit to/from stand   Upper Body Dressing : Minimal assistance;Sitting   Lower Body Dressing: Maximal assistance;Sit to/from stand   Toilet Transfer: Minimal assistance;Ambulation;RW(Simulated in room)           Functional mobility during ADLs: Minimal assistance;Rolling walker General ADL Comments: Pt demonstrating decreased functional performance with pain at right knee and left ribs. Pt demonstrating fatigue and deconditioning.      Vision         Perception     Praxis      Pertinent Vitals/Pain Pain Assessment: Faces Pain Score: 10-Worst pain ever Faces Pain Scale: Hurts even more Pain Location: L flank and R LE pain Pain Descriptors / Indicators: Sharp Pain Intervention(s): Monitored during session;Repositioned;Premedicated before session;Limited activity within patient's tolerance     Hand Dominance Right   Extremity/Trunk Assessment Upper Extremity Assessment Upper Extremity Assessment: Generalized weakness   Lower Extremity Assessment Lower Extremity Assessment: Generalized weakness   Cervical / Trunk Assessment Cervical / Trunk  Assessment: Kyphotic;Other exceptions Cervical / Trunk Exceptions: Left rib fxs   Communication Communication Communication: No difficulties   Cognition Arousal/Alertness: Awake/alert Behavior During Therapy: Flat affect Overall Cognitive  Status: Within Functional Limits for tasks assessed                                     General Comments  SpO2 in 80s on 3L    Exercises     Shoulder Instructions      Home Living Family/patient expects to be discharged to:: Skilled nursing facility Living Arrangements: Spouse/significant other                               Additional Comments: pt lived in home with 6 STE, was indep and using RW. Pt states wife unable to physically assist him      Prior Functioning/Environment Level of Independence: Independent with assistive device(s)        Comments: using RW        OT Problem List: Decreased strength;Decreased range of motion;Decreased activity tolerance;Impaired balance (sitting and/or standing);Decreased safety awareness;Decreased knowledge of use of DME or AE;Decreased knowledge of precautions;Pain      OT Treatment/Interventions: Self-care/ADL training;Therapeutic exercise;Energy conservation;DME and/or AE instruction;Therapeutic activities;Patient/family education    OT Goals(Current goals can be found in the care plan section) Acute Rehab OT Goals Patient Stated Goal: stop the pain OT Goal Formulation: With patient Time For Goal Achievement: 07/20/17 Potential to Achieve Goals: Good ADL Goals Pt Will Perform Grooming: with set-up;with supervision;standing Pt Will Perform Upper Body Dressing: with set-up;with supervision;sitting Pt Will Perform Lower Body Dressing: with min guard assist;sit to/from stand Pt Will Transfer to Toilet: with min guard assist;ambulating;bedside commode Pt Will Perform Toileting - Clothing Manipulation and hygiene: with min guard assist;sitting/lateral leans;sit to/from stand  OT Frequency: Min 2X/week   Barriers to D/C:            Co-evaluation              AM-PAC PT "6 Clicks" Daily Activity     Outcome Measure Help from another person eating meals?: A Little Help from another person taking  care of personal grooming?: A Little Help from another person toileting, which includes using toliet, bedpan, or urinal?: A Lot Help from another person bathing (including washing, rinsing, drying)?: A Lot Help from another person to put on and taking off regular upper body clothing?: A Little Help from another person to put on and taking off regular lower body clothing?: A Lot 6 Click Score: 15   End of Session Equipment Utilized During Treatment: Rolling walker;Oxygen Nurse Communication: Mobility status  Activity Tolerance: Patient tolerated treatment well;Patient limited by pain Patient left: in bed;with call bell/phone within reach;with nursing/sitter in room  OT Visit Diagnosis: Unsteadiness on feet (R26.81);Other abnormalities of gait and mobility (R26.89);Muscle weakness (generalized) (M62.81);Pain Pain - Right/Left: Left Pain - part of body: (Ribs)                Time: 1308-6578 OT Time Calculation (min): 19 min Charges:  OT General Charges $OT Visit: 1 Visit OT Evaluation $OT Eval Moderate Complexity: 1 Mod G-Codes:     [place] MSOT, OTR/L Acute Rehab Pager: [phone] Office: [place] 07/06/2017, 5:34 PM

## 2017-07-06 NOTE — Progress Notes (Signed)
VASCULAR LAB PRELIMINARY  PRELIMINARY  PRELIMINARY  PRELIMINARY  Bilateral lower extremity venous duplex completed.    Preliminary report:  There is no DVT or SVT noted in the bilateral lower extremities.  Doppler waveforms are pulsatile, suggestive of fluid overload.   Kyson Kupper, RVT 07/06/2017, 11:26 AM

## 2017-07-06 NOTE — Progress Notes (Signed)
Patient may need low air loss mattress D/T refusal of being turned secondary to pain.

## 2017-07-06 NOTE — Progress Notes (Addendum)
PROGRESS NOTE    DECOREY WAHLERT  QQV:[id number] DOB: Jun 21, 1940 DOA: 07/04/2017 PCP: [name], MD   Brief Narrative:  Marc [name] is a 77 y.o. male with history of CAD, chronic kidney disease, diabetes mellitus, CHF, hypertension presents to the ER the patient has benign persistent left-sided chest pain after fall 5 days ago.  Denies hitting his head or losing consciousness.  Patient states he was walking when he suddenly slipped and fell.  Since the fall patient also has not taken his Lasix since he was unable to make it to the bathroom.  He has noticed increasing swelling in the lower extremity since fall.  Also patient developed decubitus ulcer in the sacral area.  Assessment & Plan:   Principal Problem:   Closed fracture of rib of left side Active Problems:   Peripheral vascular disease with claudication (HCC)   Chronic kidney disease stage 3   CAD (coronary artery disease), native coronary artery   Ascending aortic aneurysm (HCC)   Type 2 diabetes mellitus with renal complication (HCC)   Acute on chronic diastolic CHF (congestive heart failure) (HCC)   Acute CHF (congestive heart failure) (HCC)   Pressure injury of skin   1. Acute Hypoxic Respiratory Failure: likely 2/2 splinting and possible pneumonia as noted on imaging below.  Required O2 to be added on 6/8. 1. Follow CXR 2. Continue pain control, incentive spirometer 3. Continuous pulse ox 4. Abx as below 5. Wean as tolerated  2. Left 5th through 12th Rib Fractures:  After mechanical fall.   1. Continue IS 2. Fentanyl, oxycodone, scheduled APAP, lidocaine patch, robaxin.  Adjust as needed. (of note, looks like he was asking for additional pain meds last night - looks like he gets oxy 15 mg at home on PMP aware - , but his nurse yesterday and in that note conveyed he gets sleepy with what's currently ordered.  Will continue current dosing, adjust as needed). 3. Consider trauma consult (discussed over  phone 6/8 and noted likely nothing they would add - will consider discussing again if problems) 4. Physical therapy, OT  3. Acute on chronic CHF last EF unknown.  With worsening lower extremity edema (R>L which is chronic).  1. Follow echo (EF 33-29%, grade 1 diastolic dysfunction) 2. Continue IV lasix 3. Follow LE [place] (pending) 4. Follow troponin (negative) 5. I/O, daily weight - not sure how accurate these are, ctm  Wt Readings from Last 3 Encounters:  07/06/17 68.9 kg (152 lb)  04/23/17 74.8 kg (165 lb)  03/09/17 76.2 kg (167 lb 15.9 oz)   4. Stage III decubitus ulcer on the sacral area -wound team consult.  Frequent repositioning.  5. Possible CAP:  1. Continue ceftriaxone/azithromycin 2. Sputum cx, blood cx 3. Urine strep, urine legionella 4. Follow procalcitonin (elevated)  6. Hypertension - Holding lisinopril with CKD. Holding metoprolol.  7. CAD and peripheral vascular disease on statins and antiplatelet agents.  Holding metoprolol.  8. Diabetes mellitus type 2 on sliding scale coverage for now.  May need basal.  Follow A1c (8.9)  9. Ascending aortic aneurysm -denies any chest pain.  10. Chronic kidney disease stage III creatinine appears to be at baseline (improving with diuresis) 1. Follow with diuresis above  2. Decreasing gabapentin dose due to chronic kidney disease and also patient was mildly lethargic on admission as noticed by ER physician.   11. 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 Sinus Bradycardia: not on any AV nodal  blocking agents (metop is being held).  EKG's with 1 degree AV block (one EKG with AV nodal dissociation, this appeared sinus and I reviewed it with cards who agreed).  Continue monitoring on tele.   13. Prolonged QTC -avoid QTC prolonging medications.  Repeat EKG in AM  14. 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  15. Thoracic Aorta Aneurysm: needs follow up imaging, CT surgery follow up  DVT prophylaxis: heparin Code Status: dnr    Family Communication: family at bedside Disposition Plan: pending improvement   Consultants:   none  Procedures:   none  Antimicrobials:  Anti-infectives (From admission, onward)   Start     Dose/Rate Route Frequency Ordered Stop   07/07/17 1000  azithromycin (ZITHROMAX) tablet 500 mg     500 mg Oral Daily 07/06/17 0918 07/12/17 0959   07/05/17 1000  cefTRIAXone (ROCEPHIN) 1 g in sodium chloride 0.9 % 100 mL IVPB     1 g 200 mL/hr over 30 Minutes Intravenous Every 24 hours 07/04/17 2210 07/12/17 0959   07/05/17 1000  azithromycin (ZITHROMAX) 500 mg in sodium chloride 0.9 % 250 mL IVPB  Status:  Discontinued     500 mg 250 mL/hr over 60 Minutes Intravenous Every 24 hours 07/04/17 2210 07/06/17 0918   07/04/17 1815  azithromycin (ZITHROMAX) 500 mg in sodium chloride 0.9 % 250 mL IVPB     500 mg 250 mL/hr over 60 Minutes Intravenous  Once 07/04/17 1806 07/04/17 2152   07/04/17 1815  cefTRIAXone (ROCEPHIN) 1 g in sodium chloride 0.9 % 100 mL IVPB     1 g 200 mL/hr over 30 Minutes Intravenous  Once 07/04/17 1806 07/04/17 1955         Subjective: Doing ok. A bit better. Still with L sided pain.  Objective: Vitals:   07/05/17 1944 07/06/17 0102 07/06/17 0539 07/06/17 1226  BP: 114/67 (!) 149/65 140/72 127/81  Pulse: (!) 55 (!) 46 (!) 44 (!) 41  Resp: 20 20 20 18   Temp: 97.6 F (36.4 C) 98 F (36.7 C) 98 F (36.7 C) (!) 97.5 F (36.4 C)  TempSrc: Oral Oral Oral Oral  SpO2: 100% 100% 100%   Weight:   68.9 kg (152 lb)   Height:        Intake/Output Summary (Last 24 hours) at 07/06/2017 1809 Last data filed at 07/06/2017 1613 Gross per 24 hour  Intake 1052 ml  Output 1550 ml  Net -498 ml   Filed Weights   07/04/17 1300 07/05/17 0345 07/06/17 0539  Weight: 78.9 kg (174 lb) 67.1 kg (148 lb) 68.9 kg (152 lb)    Examination:  General: No acute distress. Cardiovascular: Heart sounds show a regular rate, and rhythm. No gallops or rubs. No murmurs. No JVD. Lungs:  Clear to auscultation bilaterally with good air movement. No rales, rhonchi or wheezes. Abdomen: Soft, nontender, nondistended with normal active bowel sounds. No masses. No hepatosplenomegaly. Neurological: Alert and oriented 3. Moves all extremities 4. Cranial nerves II through XII grossly intact. Skin: sacrum not examined Extremities: R>L LEE Psychiatric: Mood and affect are normal. Insight and judgment are appropriate.    Data Reviewed: I have personally reviewed following labs and imaging studies  CBC: Recent Labs  Lab 07/04/17 1428 07/05/17 0702 07/06/17 0802  WBC 9.3 7.3 5.7  NEUTROABS 7.9*  --   --   HGB 11.9* 12.2* 10.5*  HCT 35.9* 37.4* 32.5*  MCV 97.3 97.9 98.5  PLT 343 301 270   Basic Metabolic Panel: Recent  Labs  Lab 07/04/17 1428 07/05/17 0702 07/06/17 0802  NA 137 138 137  K 4.3 3.9 3.6  CL 97* 97* 97*  CO2 24 26 30   GLUCOSE 342* 241* 244*  BUN 73* 66* 63*  CREATININE 2.95* 2.22* 2.11*  CALCIUM 9.5 9.2 8.7*  MG  --  2.3 2.1   GFR: Estimated Creatinine Clearance: 28.6 mL/min (A) (by C-G formula based on SCr of 2.11 mg/dL (H)). Liver Function Tests: Recent Labs  Lab 07/04/17 1428 07/05/17 0702 07/06/17 0802  AST 18 20 16   ALT 8* 6* 8*  ALKPHOS 152* 138* 112  BILITOT 0.8 0.5 0.6  PROT 7.4 6.8 5.9*  ALBUMIN 3.1* 2.8* 2.6*   No results for input(s): LIPASE, AMYLASE in the last 168 hours. Recent Labs  Lab 07/04/17 1428  AMMONIA 26   Coagulation Profile: Recent Labs  Lab 07/04/17 1428  INR 1.21   Cardiac Enzymes: Recent Labs  Lab 07/04/17 1428 07/05/17 0702  CKTOTAL 106  --   TROPONINI <0.03 <0.03   BNP (last 3 results) No results for input(s): PROBNP in the last 8760 hours. HbA1C: Recent Labs    07/05/17 0848  HGBA1C 8.9*   CBG: Recent Labs  Lab 07/05/17 1641 07/05/17 2110 07/06/17 0717 07/06/17 1221 07/06/17 1640  GLUCAP 201* 225* 245* 152* 141*   Lipid Profile: No results for input(s): CHOL, HDL, LDLCALC, TRIG,  CHOLHDL, LDLDIRECT in the last 72 hours. Thyroid Function Tests: No results for input(s): TSH, T4TOTAL, FREET4, T3FREE, THYROIDAB in the last 72 hours. Anemia Panel: No results for input(s): VITAMINB12, FOLATE, FERRITIN, TIBC, IRON, RETICCTPCT in the last 72 hours. Sepsis Labs: Recent Labs  Lab 07/04/17 1428 07/04/17 1440 07/05/17 0702  PROCALCITON  --   --  0.41  LATICACIDVEN 1.7 1.81  --     Recent Results (from the past 240 hour(s))  MRSA PCR Screening     Status: None   Collection Time: 07/04/17 11:14 PM  Result Value Ref Range Status   MRSA by PCR NEGATIVE NEGATIVE Final    Comment:        The GeneXpert MRSA Assay (FDA approved for NASAL specimens only), is one component of a comprehensive MRSA colonization surveillance program. It is not intended to diagnose MRSA infection nor to guide or monitor treatment for MRSA infections. Performed at [facility], [place] [address], [place], [place] [address]   Culture, blood (routine x 2) Call MD if unable to obtain prior to antibiotics being given     Status: None (Preliminary result)   Collection Time: 07/05/17 12:45 AM  Result Value Ref Range Status   Specimen Description BLOOD LEFT HAND  Final   Special Requests   Final    BOTTLES DRAWN AEROBIC ONLY Blood Culture results may not be optimal due to an inadequate volume of blood received in culture bottles   Culture   Final    NO GROWTH 1 DAY Performed at [facility] [address], [place], [place] [address]    Report Status PENDING  Incomplete  Culture, blood (routine x 2) Call MD if unable to obtain prior to antibiotics being given     Status: None (Preliminary result)   Collection Time: 07/05/17 12:50 AM  Result Value Ref Range Status   Specimen Description BLOOD LEFT HAND  Final   Special Requests   Final    BOTTLES DRAWN AEROBIC ONLY Blood Culture results may not be optimal due to an inadequate volume of blood received  in culture bottles   Culture    Final    NO GROWTH 1 DAY Performed at [facility], [place] [address] [place], [place] [address]    Report Status PENDING  Incomplete         Radiology Studies: Dg Chest Port 1 View  Result Date: 07/05/2017 CLINICAL DATA:  Hypoxia EXAM: PORTABLE CHEST 1 VIEW COMPARISON:  07/04/2017 FINDINGS: Similar minor basilar atelectasis. No new collapse, consolidation, edema pattern, developing effusion, or pneumothorax. Several acute displaced left rib fractures again noted. No developing subcutaneous emphysema or chest wall soft tissue asymmetry. Exam is rotated to the right. Trachea is midline. Aorta is tortuous. Thoracic stimulator noted. IMPRESSION: Several acute left lateral displaced rib fractures Persistent bibasilar atelectasis No developing effusion, airspace process or pneumothorax. Electronically Signed   By: Jerilynn Mages.  Shick M.D.   On: 07/05/2017 10:25        Scheduled Meds: . acetaminophen  650 mg Oral Q6H  . allopurinol  300 mg Oral QHS  . atorvastatin  10 mg Oral Daily  . [START ON 07/07/2017] azithromycin  500 mg Oral Daily  . clopidogrel  75 mg Oral QODAY  . colchicine  0.6 mg Oral Daily  . collagenase   Topical Daily  . docusate sodium  100 mg Oral BID  . furosemide  40 mg Intravenous Q12H  . gabapentin  700 mg Oral Daily  . heparin  5,000 Units Subcutaneous Q8H  . insulin aspart  0-15 Units Subcutaneous TID WC  . insulin aspart  0-5 Units Subcutaneous QHS  . lidocaine  1 patch Transdermal Q24H  . mouth rinse  15 mL Mouth Rinse BID  . pneumococcal 23 valent vaccine  0.5 mL Intramuscular Tomorrow-1000  . predniSONE  10 mg Oral Q breakfast  . prenatal vitamin w/FE, FA  1 tablet Oral Q1200  . sodium bicarbonate  1,300 mg Oral BID   Continuous Infusions: . cefTRIAXone (ROCEPHIN)  IV Stopped (07/06/17 0936)  . methocarbamol (ROBAXIN)  IV       LOS: 2 days    Time spent: over 30 min    Fayrene Helper, MD [facility] Pager [phone]  If 7PM-7AM, please  contact night-coverage [email or web address] Password [email or web address] 07/06/2017, 6:09 PM

## 2017-07-06 NOTE — Progress Notes (Signed)
  Echocardiogram 2D Echocardiogram has been performed.  Marc [name] 07/06/2017, 11:19 AM

## 2017-07-06 NOTE — Consult Note (Signed)
[facility] Nurse wound consult note Reason for Consult: Bilateral buttock at gluteal cleft Stage 3 pressure injuries Wound type: Pressure Pressure Injury POA: Yes Measurement: Left Buttock:  9cm x 0.6cm x 0.2cm with 30% of wound bed obscured by grey soft slough, 70% red, moist Right buttock: 6cm x 0.8cm x 0.2cm with thin, movable slough over wound bed, pink tissue beneath.  Wound bed:As described above Drainage (amount, consistency, odor) Small amount of light yellow exudate on old dressing consistent with autolytically debriding nonviable tissue Periwound:Intact, dry Dressing procedure/placement/frequency: I will implement a POC consisting of topical enzymatic debriding agent (collagenase) topped with a NS moistened gauze dressing and covered with silicone foam to secure.  A pressure redistribution chair pad is provided for OOB to chair use.  [facility] nursing team will not follow, but will remain available to this patient, the nursing and medical teams.  Please re-consult if needed. Thanks, Maudie Flakes, MSN, RN, [place], [facility]  Pager# [phone]

## 2017-07-06 NOTE — Progress Notes (Signed)
PHARMACIST - PHYSICIAN COMMUNICATION  CONCERNING: Antibiotic IV to Oral Route Change Policy  RECOMMENDATION: This patient is receivingazithromycinby the intravenous route. Based on criteria approved by the Pharmacy and Therapeutics Committee, the antibiotic(s) is/are being converted to the equivalent oral dose form(s).   DESCRIPTION:  These criteria include:  Patient being treated for a respiratory tract infection, urinary tract infection, cellulitis or clostridium difficile associated diarrhea if on metronidazole  The patient is not neutropenic and does not exhibit a GI malabsorption state  The patient is eating (either orally or via tube) and/or has been taking other orally administered medications for a least 24 hours  The patient is improving clinically and has a Tmax < 100.5  If you have questions about this conversion, please contact the Pharmacy Department  [] [phone] ) [facility] [] [phone] ) [facility] [x] [phone] ) [facility] [] [phone] ) [facility] [] [phone] ) [place], PharmD Pharmacy Resident Phone: [phone]

## 2017-07-06 NOTE — Evaluation (Signed)
Physical Therapy Evaluation Patient Details Name: Marc [name] MRN: [id number] DOB: [birth year] Today's Date: 07/06/2017   History of Present Illness  Marc [name] is a 77 y.o. male with history of CAD, chronic kidney disease, diabetes mellitus, CHF, hypertension presents to the ER the patient has benign persistent left-sided chest pain after fall 5 days ago.  He has noticed increasing swelling in the lower extremity since fall due to not taking his lasix. Also patient developed decubitus ulcer in the sacral area.  Clinical Impression  Pt was indep with use of RW PTA. Pt mobility now greatly limited by L flank pain and R LE pain requiring mod/maxA for all mobility. Pt now with high falls risk. Recommend ST-SNF to address below deficits and achieve safe mod I level of function for safe transition home. Acute PT to con't to follow.    Follow Up Recommendations SNF;Supervision/Assistance - 24 hour    Equipment Recommendations  (TBD by next venue)    Recommendations for Other Services       Precautions / Restrictions Precautions Precautions: Fall Restrictions Weight Bearing Restrictions: No      Mobility  Bed Mobility Overal bed mobility: Needs Assistance Bed Mobility: Rolling;Sidelying to Sit Rolling: Max assist;Mod assist Sidelying to sit: Max assist       General bed mobility comments: pt able to initiate LE movement off EOB, maxA for trunk elevation. pt used pillow to splint L flank to assist with pain  Transfers Overall transfer level: Needs assistance Equipment used: 1 person hand held assist Transfers: Stand Pivot Transfers;Sit to/from Stand Sit to Stand: Mod assist Stand pivot transfers: Mod assist       General transfer comment: used rocking method and holding pillow under L flank to help with pain management, modA to power up, modA during std pvt to chair to maintain balance  Ambulation/Gait             General Gait Details: limited to chair due to  pain  Stairs            Wheelchair Mobility    Modified Rankin (Stroke Patients Only)       Balance Overall balance assessment: Needs assistance Sitting-balance support: Feet supported;No upper extremity supported Sitting balance-Leahy Scale: Fair     Standing balance support: No upper extremity supported Standing balance-Leahy Scale: Poor                               Pertinent Vitals/Pain Pain Assessment: 0-10 Pain Score: 10-Worst pain ever Pain Location: L flank and R LE pain Pain Descriptors / Indicators: Sharp Pain Intervention(s): RN gave pain meds during session    [place] expects to be discharged to:: Skilled nursing facility Living Arrangements: Spouse/significant other               Additional Comments: pt lived in home with 6 STE, was indep and using RW. Pt states wife unable to physically assist him    Prior Function Level of Independence: Independent with assistive device(s)         Comments: using RW     Hand Dominance   Dominant Hand: Right    Extremity/Trunk Assessment   Upper Extremity Assessment Upper Extremity Assessment: Generalized weakness(due to flank pain from fall)    Lower Extremity Assessment Lower Extremity Assessment: Generalized weakness(R LE with increased swelling and pain)    Cervical / Trunk Assessment Cervical / Trunk Assessment: Kyphotic  Communication   Communication: No difficulties  Cognition Arousal/Alertness: Awake/alert Behavior During Therapy: Flat affect Overall Cognitive Status: Within Functional Limits for tasks assessed                                        General Comments General comments (skin integrity, edema, etc.): HR between 45-51 at rest and in 60s during mobility. Pt's SpO2 drops to 70s during mobility , most likely due to pain and holding breath    Exercises     Assessment/Plan    PT Assessment Patient needs continued PT  services  PT Problem List Decreased strength;Decreased range of motion;Decreased activity tolerance;Decreased balance;Decreased mobility;Decreased coordination;Decreased cognition;Decreased knowledge of use of DME;Decreased safety awareness       PT Treatment Interventions DME instruction;Gait training;Stair training;Functional mobility training;Therapeutic activities;Therapeutic exercise;Balance training;Neuromuscular re-education    PT Goals (Current goals can be found in the Care Plan section)  Acute Rehab PT Goals Patient Stated Goal: stop the pain PT Goal Formulation: With patient Time For Goal Achievement: 07/20/17 Potential to Achieve Goals: Good    Frequency Min 3X/week   Barriers to discharge Decreased caregiver support wife unable to assist    Co-evaluation               AM-PAC PT "6 Clicks" Daily Activity  Outcome Measure Difficulty turning over in bed (including adjusting bedclothes, sheets and blankets)?: Unable Difficulty moving from lying on back to sitting on the side of the bed? : Unable Difficulty sitting down on and standing up from a chair with arms (e.g., wheelchair, bedside commode, etc,.)?: Unable Help needed moving to and from a bed to chair (including a wheelchair)?: A Lot Help needed walking in hospital room?: A Lot Help needed climbing 3-5 steps with a railing? : A Lot 6 Click Score: 9    End of Session Equipment Utilized During Treatment: Oxygen Activity Tolerance: Patient limited by pain Patient left: in chair;with call bell/phone within reach;with chair alarm set;with nursing/sitter in room Nurse Communication: Mobility status PT Visit Diagnosis: Unsteadiness on feet (R26.81);Pain Pain - Right/Left: Left Pain - part of body: (flank and R LE)    Time: 1601-0932 PT Time Calculation (min) (ACUTE ONLY): 30 min   Charges:   PT Evaluation $PT Eval Moderate Complexity: 1 Mod PT Treatments $Therapeutic Activity: 8-22 mins   PT G Codes:         Kittie Plater, PT, DPT Pager #: [phone] Office #: [phone]   Marc [name] 07/06/2017, 3:19 PM

## 2017-07-06 NOTE — Plan of Care (Signed)
  Problem: Health Behavior/Discharge Planning: Goal: Ability to manage health-related needs will improve Outcome: Progressing   Problem: Clinical Measurements: Goal: Respiratory complications will improve Outcome: Progressing   Problem: Activity: Goal: Risk for activity intolerance will decrease Outcome: Progressing   Problem: Coping: Goal: Level of anxiety will decrease Outcome: Progressing   Problem: Pain Managment: Goal: General experience of comfort will improve Outcome: Progressing   

## 2017-07-07 LAB — URINALYSIS, ROUTINE W REFLEX MICROSCOPIC
BILIRUBIN URINE: NEGATIVE
Glucose, UA: 50 mg/dL — AB
Ketones, ur: NEGATIVE mg/dL
LEUKOCYTES UA: NEGATIVE
Nitrite: NEGATIVE
PROTEIN: NEGATIVE mg/dL
Specific Gravity, Urine: 1.009 (ref 1.005–1.030)
pH: 6 (ref 5.0–8.0)

## 2017-07-07 LAB — BASIC METABOLIC PANEL
Anion gap: 10 (ref 5–15)
BUN: 65 mg/dL — ABNORMAL HIGH (ref 6–20)
CALCIUM: 9 mg/dL (ref 8.9–10.3)
CO2: 33 mmol/L — AB (ref 22–32)
CREATININE: 1.97 mg/dL — AB (ref 0.61–1.24)
Chloride: 98 mmol/L — ABNORMAL LOW (ref 101–111)
GFR calc Af Amer: 36 mL/min — ABNORMAL LOW (ref >60)
GFR calc non Af Amer: 31 mL/min — ABNORMAL LOW (ref >60)
GLUCOSE: 128 mg/dL — AB (ref 65–99)
Potassium: 3.7 mmol/L (ref 3.5–5.1)
Sodium: 141 mmol/L (ref 135–145)

## 2017-07-07 LAB — CBC
HEMATOCRIT: 33.9 % — AB (ref 39.0–52.0)
Hemoglobin: 11.1 g/dL — ABNORMAL LOW (ref 13.0–17.0)
MCH: 32.6 pg (ref 26.0–34.0)
MCHC: 32.7 g/dL (ref 30.0–36.0)
MCV: 99.4 fL (ref 78.0–100.0)
Platelets: 305 10*3/uL (ref 150–400)
RBC: 3.41 MIL/uL — ABNORMAL LOW (ref 4.22–5.81)
RDW: 13.2 % (ref 11.5–15.5)
WBC: 6.1 10*3/uL (ref 4.0–10.5)

## 2017-07-07 LAB — LEGIONELLA PNEUMOPHILA SEROGP 1 UR AG: L. pneumophila Serogp 1 Ur Ag: NEGATIVE

## 2017-07-07 LAB — GLUCOSE, CAPILLARY
GLUCOSE-CAPILLARY: 364 mg/dL — AB (ref 65–99)
Glucose-Capillary: 130 mg/dL — ABNORMAL HIGH (ref 65–99)
Glucose-Capillary: 317 mg/dL — ABNORMAL HIGH (ref 65–99)
Glucose-Capillary: 190 mg/dL — ABNORMAL HIGH (ref 65–99)

## 2017-07-07 LAB — MAGNESIUM: Magnesium: 2.2 mg/dL (ref 1.7–2.4)

## 2017-07-07 MED ORDER — INSULIN ASPART 100 UNIT/ML ~~LOC~~ SOLN
3.0000 [IU] | Freq: Three times a day (TID) | SUBCUTANEOUS | Status: DC
  Administered 2017-07-08 – 2017-07-11 (×10): 3 [IU] via SUBCUTANEOUS

## 2017-07-07 MED ORDER — POLYETHYLENE GLYCOL 3350 17 G PO PACK
17.0000 g | PACK | Freq: Every day | ORAL | Status: DC
  Administered 2017-07-07: 17 g via ORAL
  Filled 2017-07-07: qty 1

## 2017-07-07 MED ORDER — INSULIN ASPART 100 UNIT/ML ~~LOC~~ SOLN
0.0000 [IU] | Freq: Three times a day (TID) | SUBCUTANEOUS | Status: DC
  Administered 2017-07-07: 15 [IU] via SUBCUTANEOUS
  Administered 2017-07-07: 11 [IU] via SUBCUTANEOUS
  Administered 2017-07-08: 5 [IU] via SUBCUTANEOUS
  Administered 2017-07-08: 3 [IU] via SUBCUTANEOUS
  Administered 2017-07-09: 5 [IU] via SUBCUTANEOUS
  Administered 2017-07-09 – 2017-07-10 (×2): 2 [IU] via SUBCUTANEOUS
  Administered 2017-07-10: 3 [IU] via SUBCUTANEOUS
  Administered 2017-07-10: 8 [IU] via SUBCUTANEOUS
  Administered 2017-07-11 (×2): 2 [IU] via SUBCUTANEOUS

## 2017-07-07 MED ORDER — INSULIN ASPART 100 UNIT/ML ~~LOC~~ SOLN
0.0000 [IU] | Freq: Every day | SUBCUTANEOUS | Status: DC
  Administered 2017-07-08: 4 [IU] via SUBCUTANEOUS

## 2017-07-07 MED ORDER — FUROSEMIDE 10 MG/ML IJ SOLN
60.0000 mg | Freq: Two times a day (BID) | INTRAMUSCULAR | Status: DC
  Administered 2017-07-07 – 2017-07-10 (×6): 60 mg via INTRAVENOUS
  Filled 2017-07-07 (×6): qty 6

## 2017-07-07 MED ORDER — BISACODYL 10 MG RE SUPP: 10.0000 mg | Freq: Every day | RECTAL | Status: DC | PRN

## 2017-07-07 MED ORDER — INSULIN ASPART 100 UNIT/ML ~~LOC~~ SOLN: 0.0000 [IU] | Freq: Three times a day (TID) | SUBCUTANEOUS | Status: DC

## 2017-07-07 MED ORDER — POLYETHYLENE GLYCOL 3350 17 G PO PACK
17.0000 g | PACK | Freq: Two times a day (BID) | ORAL | Status: DC
  Administered 2017-07-07 – 2017-07-11 (×5): 17 g via ORAL
  Filled 2017-07-07 (×8): qty 1

## 2017-07-07 MED ORDER — INSULIN ASPART 100 UNIT/ML ~~LOC~~ SOLN: 3.0000 [IU] | Freq: Three times a day (TID) | SUBCUTANEOUS | Status: DC

## 2017-07-07 NOTE — NC FL2 (Signed)
[place] LEVEL OF CARE SCREENING TOOL     IDENTIFICATION  Patient Name: Marc [name] Birthdate: 1940-02-08 Sex: male Admission Date (Current Location): 07/04/2017  [facility] and [place] Number:  Herbalist and Address:  [facility] [place] [place] [address] [place], [place] [address]      Provider Number: [id number]  Attending Physician Name and Address:  Elodia Florence., *  Relative Name and Phone Number:       Current Level of Care: Hospital Recommended Level of Care: [place] Prior Approval Number:    Date Approved/Denied:   PASRR Number: [id number]  Discharge Plan: SNF    Current Diagnoses: Patient Active Problem List   Diagnosis Date Noted  . Pressure injury of skin 07/05/2017  . Closed fracture of rib of left side 07/04/2017  . Acute on chronic diastolic CHF (congestive heart failure) ([place]) 07/04/2017  . Acute CHF (congestive heart failure) ([place]) 07/04/2017  . Chronic pain 03/08/2017  . Antibiotic long-term use 09/14/2015  . 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 Right foot infection 04/13/2015  . Pressure ulcer 03/20/2015  . Chronic diastolic CHF (congestive heart failure) ([place])   . Chronic anemia 03/01/2015  . Type 2 diabetes mellitus with renal complication ([place]) 42/68/3419  . Atherosclerosis of native arteries of extremities with gangrene, right leg ([place]) 02/07/2015  . Acute renal failure superimposed on stage 3 chronic kidney disease ([place]) 01/16/2015  . Type 2 diabetes mellitus with right diabetic foot infection ([place])   . CAD (coronary artery disease), native coronary artery   . Ascending aortic aneurysm ([place])   . Mass of lower lobe of left lung   . Chronic kidney disease stage 3   . Peripheral vascular disease with claudication ([place])   . Gout 02/07/2013  . Hyperlipidemia 02/07/2013  . GERD (gastroesophageal reflux disease) 02/07/2013  . Type 2 diabetes mellitus with  peripheral neuropathy (H[facility])   . Spinal stenosis of lumbar region 01/14/2013  . Tobacco abuse disorder 03/10/2012  . Bradycardia:  Normal condition for this patient. Previously worked up. 02/05/2012  . Hx of peptic ulcer 05/21/2010  . Hx of adenomatous colonic polyps 05/21/2010  . Hypertensive heart disease   . Lumbar disc disease     Orientation RESPIRATION BLADDER Height & Weight     Self, Time, Situation, Place  O2(Nasal Canula 1 L) Incontinent, External catheter Weight: 153 lb (69.4 kg) Height:  6\' 1"  (185.4 cm)  BEHAVIORAL SYMPTOMS/MOOD NEUROLOGICAL BOWEL NUTRITION STATUS  (None)   Continent Diet(Heart healthy/carb modified. Fluid restriction 1200 mL.)  AMBULATORY STATUS COMMUNICATION OF NEEDS Skin   Extensive Assist Verbally Bruising, Other (Comment), PU Stage and Appropriate Care(Amputation, Skin tear.)   PU Stage 2 Dressing: (Right buttocks: Foam every 3 days. Left buttocks: Foam.)                   Personal Care Assistance Level of Assistance  Bathing, Feeding, Dressing Bathing Assistance: Maximum assistance Feeding assistance: Limited assistance Dressing Assistance: Maximum assistance     Functional Limitations Info  Sight, Hearing, Speech Sight Info: Adequate Hearing Info: Adequate Speech Info: Adequate    SPECIAL CARE FACTORS FREQUENCY  PT (By licensed PT), OT (By licensed OT)     PT Frequency: 5 x week OT Frequency: 5 x week            Contractures Contractures Info: Not present    Additional Factors Info  Code Status, Allergies Code  Status Info: DNR Allergies Info: Actos (Pioglitazone)           Current Medications (07/07/2017):  This is the current hospital active medication list Current Facility-Administered Medications  Medication Dose Route Frequency Provider Last Rate Last Dose  . acetaminophen (TYLENOL) tablet 650 mg  650 mg Oral Q6H Elodia Florence., MD   650 mg at 07/07/17 0914  . allopurinol (ZYLOPRIM) tablet 300 mg  300 mg  Oral QHS Rise Patience, MD   300 mg at 07/06/17 2142  . atorvastatin (LIPITOR) tablet 10 mg  10 mg Oral Daily Rise Patience, MD   10 mg at 07/06/17 2423  . azithromycin (ZITHROMAX) tablet 500 mg  500 mg Oral Daily Leroy Libman, RPH   500 mg at 07/07/17 0913  . cefTRIAXone (ROCEPHIN) 1 g in sodium chloride 0.9 % 100 mL IVPB  1 g Intravenous Q24H Rise Patience, MD 200 mL/hr at 07/07/17 0914 1 g at 07/07/17 0914  . clopidogrel (PLAVIX) tablet 75 mg  75 mg Oral Joellyn Quails, MD   75 mg at 07/07/17 0804  . colchicine tablet 0.6 mg  0.6 mg Oral Daily Rise Patience, MD   0.6 mg at 07/07/17 5361  . collagenase (SANTYL) ointment   Topical Daily Elodia Florence., MD      . docusate sodium (COLACE) capsule 100 mg  100 mg Oral BID Rise Patience, MD   100 mg at 07/07/17 0804  . fentaNYL (SUBLIMAZE) injection 25 mcg  25 mcg Intravenous Q2H PRN Rise Patience, MD   25 mcg at 07/05/17 0009  . furosemide (LASIX) injection 40 mg  40 mg Intravenous Q12H Rise Patience, MD   40 mg at 07/07/17 0945  . gabapentin (NEURONTIN) tablet 700 mg  700 mg Oral Daily Elodia Florence., MD   700 mg at 07/07/17 0913  . heparin injection 5,000 Units  5,000 Units Subcutaneous Q8H Rise Patience, MD   5,000 Units at 07/07/17 0554  . insulin aspart (novoLOG) injection 0-15 Units  0-15 Units Subcutaneous TID WC Elodia Florence., MD   2 Units at 07/07/17 [phone]  . insulin aspart (novoLOG) injection 0-5 Units  0-5 Units Subcutaneous QHS Elodia Florence., MD   4 Units at 07/06/17 2141  . lidocaine (LIDODERM) 5 % 1 patch  1 patch Transdermal Q24H Elodia Florence., MD   1 patch at 07/07/17 [phone]  . MEDLINE mouth rinse  15 mL Mouth Rinse BID Elodia Florence., MD   15 mL at 07/07/17 0920  . methocarbamol (ROBAXIN) 500 mg in dextrose 5 % 50 mL IVPB  500 mg Intravenous Q8H PRN Elodia Florence., MD      . ondansetron [facility]) tablet 4 mg  4  mg Oral Q6H PRN Rise Patience, MD       Or  . ondansetron [facility]) injection 4 mg  4 mg Intravenous Q6H PRN Rise Patience, MD      . oxyCODONE (Oxy IR/ROXICODONE) immediate release tablet 5 mg  5 mg Oral Q4H PRN Elodia Florence., MD   5 mg at 07/06/17 1246  . predniSONE (DELTASONE) tablet 10 mg  10 mg Oral Q breakfast Rise Patience, MD   10 mg at 07/07/17 0804  . prenatal vitamin w/FE, FA (PRENATAL 1 + 1) 27-1 MG tablet 1 tablet  1 tablet Oral Q1200 Rise Patience, MD   1  tablet at 07/06/17 1651  . sodium bicarbonate tablet 1,300 mg  1,300 mg Oral BID Rise Patience, MD   1,300 mg at 07/07/17 2774     Discharge Medications: Please see discharge summary for a list of discharge medications.  Relevant Imaging Results:  Relevant Lab Results:   Additional Information SS#: [id number]  Candie Chroman, LCSW

## 2017-07-07 NOTE — Clinical Social Work Placement (Signed)
   CLINICAL SOCIAL WORK PLACEMENT  NOTE  Date:  07/07/2017  Patient Details  Name: Marc [name] MRN: [id number] Date of Birth: 05-08-1940  Clinical Social Work is seeking post-discharge placement for this patient at the [place] level of care (*CSW will initial, date and re-position this form in  chart as items are completed):  Yes   Patient/family provided with [place] Work Department's list of facilities offering this level of care within the geographic area requested by the patient (or if unable, by the patient's family).  Yes   Patient/family informed of their freedom to choose among providers that offer the needed level of care, that participate in Medicare, Medicaid or managed care program needed by the patient, have an available bed and are willing to accept the patient.  Yes   Patient/family informed of [facility]'s ownership interest in [facility] and [facility], as well as of the fact that they are under no obligation to receive care at these facilities.  PASRR submitted to EDS on 07/07/17     PASRR number received on       Existing PASRR number confirmed on 07/07/17     FL2 transmitted to all facilities in geographic area requested by pt/family on 07/07/17     FL2 transmitted to all facilities within larger geographic area on       Patient informed that his/her managed care company has contracts with or will negotiate with certain facilities, including the following:            Patient/family informed of bed offers received.  Patient chooses bed at       Physician recommends and patient chooses bed at      Patient to be transferred to   on  .  Patient to be transferred to facility by       Patient family notified on   of transfer.  Name of family member notified:        PHYSICIAN Please sign FL2     Additional Comment:    _______________________________________________ Candie Chroman, LCSW 07/07/2017,  9:53 AM

## 2017-07-07 NOTE — Progress Notes (Signed)
RN attempted to disimpact pt per MD order. RN disimpacted a small amount of stool. Pt states for RN to apply lube and he will attempt to have a bowel movement again.

## 2017-07-07 NOTE — Progress Notes (Signed)
Patient using incentive spirometry, doesn't like to be turned much.

## 2017-07-07 NOTE — Progress Notes (Addendum)
PROGRESS NOTE    Marc [name]  TFT:[id number] DOB: 1940-08-20 DOA: 07/04/2017 PCP: Marc Downing, MD   Brief Narrative:  Marc [name] is Marc [name] 77 y.o. male with history of CAD, chronic kidney disease, diabetes mellitus, CHF, hypertension presents to the ER the patient has benign persistent left-sided chest pain after fall 5 days ago.  Denies hitting his head or losing consciousness.  Patient states he was walking when he suddenly slipped and fell.  Since the fall patient also has not taken his Lasix since he was unable to make it to the bathroom.  He has noticed increasing swelling in the lower extremity since fall.  Also patient developed decubitus ulcer in the sacral area.  Assessment & Plan:   Principal Problem:   Closed fracture of rib of left side Active Problems:   Peripheral vascular disease with claudication (HCC)   Chronic kidney disease stage 3   CAD (coronary artery disease), native coronary artery   Ascending aortic aneurysm (HCC)   Type 2 diabetes mellitus with renal complication (HCC)   Acute on chronic diastolic CHF (congestive heart failure) (HCC)   Acute CHF (congestive heart failure) (HCC)   Pressure injury of skin   1. Acute Hypoxic Respiratory Failure: likely 2/2 splinting and possible pneumonia as noted on imaging below.  Required O2 to be added on 6/8. 1. Follow CXR 2. Continue pain control, incentive spirometer 3. Continuous pulse ox 4. Abx as below 5. Wean as tolerated - doing ok on room air today.  Will continue to monitor.    2. Left 5th through 12th Rib Fractures:  After mechanical fall.   1. Continue IS 2. Fentanyl, oxycodone, scheduled APAP, lidocaine patch, robaxin.  Adjust as needed. (of note, looks like he was asking for additional pain meds previously (see nursing note from 6/9)- looks like he gets oxy 15 mg at home on PMP aware - , but his nurse yesterday and in that note conveyed he gets sleepy with what's currently ordered.  Will  continue current dosing which he notes helps, adjust as needed). 3. Consider trauma consult (discussed over phone 6/8 and noted likely nothing they would add - will consider discussing again if problems) 4. Physical therapy, OT  3. Acute on chronic CHF last EF unknown.  With worsening lower extremity edema (R>L which is chronic).  1. Follow echo (EF 70-62%, grade 1 diastolic dysfunction) 2. Continue IV lasix -> was taking 80 at home, will increase to 60 IV here 3. Follow LE [place] (negative prelim, follow) 4. Follow troponin (negative) 5. I/O, daily weight - weights actually rising, he's got ok UOP.  Will increase dose as above.  Wt Readings from Last 3 Encounters:  07/07/17 69.4 kg (153 lb)  04/23/17 74.8 kg (165 lb)  03/09/17 76.2 kg (167 lb 15.9 oz)   4. Stage III decubitus ulcer on the sacral area -wound team consult, appreciate recs.  Frequent repositioning.  5. Possible CAP:  1. Continue ceftriaxone/azithromycin 2. Sputum cx, blood cx 3. Urine strep (negative), urine legionella (pending) 4. Follow procalcitonin (elevated)  6. Hypertension - Holding lisinopril with CKD. Holding metoprolol with bradycardia.  7. CAD and peripheral vascular disease on statins and antiplatelet agents.  Holding metoprolol.  8. Diabetes mellitus type 2 on sliding scale coverage for now.  AM bg reasonable, but elevated mealtime, will add mealtime insulin.  May need basal.  Follow A1c (8.9).  Will need med at discharge.  9. Ascending aortic aneurysm -denies any chest pain.  10. Chronic kidney disease stage III creatinine appears to be at baseline (improving with diuresis) 1. Follow with diuresis above  2. Decreasing gabapentin dose due to chronic kidney disease and also patient was mildly lethargic on admission as noticed by ER physician.   11. 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 Sinus Bradycardia: not on any AV nodal blocking agents (metop is being held).  EKG's with 1 degree AV block  (one EKG with AV nodal dissociation, this appeared sinus and I reviewed it with cards who agreed).  Continue monitoring on tele.  Looks like this is primarily when he's sleeping.  13. Prolonged QTC -avoid QTC prolonging medications.  Repeat EKG in AM (improved)  14. 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  15. Thoracic Aorta Aneurysm: needs follow up imaging, CT surgery follow up  16. Dysuria  Hematuria: UA doesn't appear infected, but his is symptomatic and has hematuria.  Will follow culture.  If negative, needs follow up for hematuria.   17. On prednisone 10 mg daily for pain per his report  DVT prophylaxis: heparin Code Status: dnr  Family Communication: family at bedside Disposition Plan: pending improvement   Consultants:   none  Procedures:   none  Antimicrobials:  Anti-infectives (From admission, onward)   Start     Dose/Rate Route Frequency Ordered Stop   07/07/17 1000  azithromycin (ZITHROMAX) tablet 500 mg     500 mg Oral Daily 07/06/17 0918 07/12/17 0959   07/05/17 1000  cefTRIAXone (ROCEPHIN) 1 g in sodium chloride 0.9 % 100 mL IVPB     1 g 200 mL/hr over 30 Minutes Intravenous Every 24 hours 07/04/17 2210 07/12/17 0959   07/05/17 1000  azithromycin (ZITHROMAX) 500 mg in sodium chloride 0.9 % 250 mL IVPB  Status:  Discontinued     500 mg 250 mL/hr over 60 Minutes Intravenous Every 24 hours 07/04/17 2210 07/06/17 0918   07/04/17 1815  azithromycin (ZITHROMAX) 500 mg in sodium chloride 0.9 % 250 mL IVPB     500 mg 250 mL/hr over 60 Minutes Intravenous  Once 07/04/17 1806 07/04/17 2152   07/04/17 1815  cefTRIAXone (ROCEPHIN) 1 g in sodium chloride 0.9 % 100 mL IVPB     1 g 200 mL/hr over 30 Minutes Intravenous  Once 07/04/17 1806 07/04/17 1955         Subjective: Persistent L sided pain.  Objective: Vitals:   07/06/17 1226 07/06/17 1914 07/06/17 1935 07/07/17 0600  BP: 127/81  (!) 110/56 (!) 148/71  Pulse: (!) 41  (!) 50 (!) 48  Resp: 18 18 20 20   Temp:  (!) 97.5 F (36.4 C)  (!) 97.5 F (36.4 C) 98 F (36.7 C)  TempSrc: Oral  Oral Oral  SpO2:  100% 100% 100%  Weight:    69.4 kg (153 lb)  Height:        Intake/Output Summary (Last 24 hours) at 07/07/2017 1139 Last data filed at 07/07/2017 1056 Gross per 24 hour  Intake 1440 ml  Output 1950 ml  Net -510 ml   Filed Weights   07/05/17 0345 07/06/17 0539 07/07/17 0600  Weight: 67.1 kg (148 lb) 68.9 kg (152 lb) 69.4 kg (153 lb)    Examination:  General: No acute distress. Cardiovascular: Heart sounds show Tevyn Codd regular rate, and rhythm. No gallops or rubs. No murmurs. No JVD. Lungs: Clear to auscultation bilaterally with good air movement. No rales, rhonchi or wheezes. Abdomen: Soft, nontender, nondistended with normal active bowel sounds.  No masses. No hepatosplenomegaly. Neurological: Alert and oriented 3. Moves all extremities 4. Cranial nerves II through XII grossly intact. Skin: stage 3 decub Extremities: R>L LEE Psychiatric: Mood and affect are normal. Insight and judgment are appropriate.   Data Reviewed: I have personally reviewed following labs and imaging studies  CBC: Recent Labs  Lab 07/04/17 1428 07/05/17 0702 07/06/17 0802 07/07/17 0515  WBC 9.3 7.3 5.7 6.1  NEUTROABS 7.9*  --   --   --   HGB 11.9* 12.2* 10.5* 11.1*  HCT 35.9* 37.4* 32.5* 33.9*  MCV 97.3 97.9 98.5 99.4  PLT 343 301 300 614   Basic Metabolic Panel: Recent Labs  Lab 07/04/17 1428 07/05/17 0702 07/06/17 0802 07/07/17 0515  NA 137 138 137 141  K 4.3 3.9 3.6 3.7  CL 97* 97* 97* 98*  CO2 24 26 30  33*  GLUCOSE 342* 241* 244* 128*  BUN 73* 66* 63* 65*  CREATININE 2.95* 2.22* 2.11* 1.97*  CALCIUM 9.5 9.2 8.7* 9.0  MG  --  2.3 2.1 2.2   GFR: Estimated Creatinine Clearance: 30.8 mL/min (Rosaly Labarbera) (by C-G formula based on SCr of 1.97 mg/dL (H)). Liver Function Tests: Recent Labs  Lab 07/04/17 1428 07/05/17 0702 07/06/17 0802  AST 18 20 16   ALT 8* 6* 8*  ALKPHOS 152* 138* 112  BILITOT  0.8 0.5 0.6  PROT 7.4 6.8 5.9*  ALBUMIN 3.1* 2.8* 2.6*   No results for input(s): LIPASE, AMYLASE in the last 168 hours. Recent Labs  Lab 07/04/17 1428  AMMONIA 26   Coagulation Profile: Recent Labs  Lab 07/04/17 1428  INR 1.21   Cardiac Enzymes: Recent Labs  Lab 07/04/17 1428 07/05/17 0702  CKTOTAL 106  --   TROPONINI <0.03 <0.03   BNP (last 3 results) No results for input(s): PROBNP in the last 8760 hours. HbA1C: Recent Labs    07/05/17 0848  HGBA1C 8.9*   CBG: Recent Labs  Lab 07/06/17 1221 07/06/17 1640 07/06/17 2115 07/07/17 0730 07/07/17 1132  GLUCAP 152* 141* 341* 130* 317*   Lipid Profile: No results for input(s): CHOL, HDL, LDLCALC, TRIG, CHOLHDL, LDLDIRECT in the last 72 hours. Thyroid Function Tests: No results for input(s): TSH, T4TOTAL, FREET4, T3FREE, THYROIDAB in the last 72 hours. Anemia Panel: No results for input(s): VITAMINB12, FOLATE, FERRITIN, TIBC, IRON, RETICCTPCT in the last 72 hours. Sepsis Labs: Recent Labs  Lab 07/04/17 1428 07/04/17 1440 07/05/17 0702  PROCALCITON  --   --  0.41  LATICACIDVEN 1.7 1.81  --     Recent Results (from the past 240 hour(s))  MRSA PCR Screening     Status: None   Collection Time: 07/04/17 11:14 PM  Result Value Ref Range Status   MRSA by PCR NEGATIVE NEGATIVE Final    Comment:        The GeneXpert MRSA Assay (FDA approved for NASAL specimens only), is one component of Marye Eagen comprehensive MRSA colonization surveillance program. It is not intended to diagnose MRSA infection nor to guide or monitor treatment for MRSA infections. Performed at [facility], [place] [address] [place], [place] [address]   Culture, blood (routine x 2) Call MD if unable to obtain prior to antibiotics being given     Status: None (Preliminary result)   Collection Time: 07/05/17 12:45 AM  Result Value Ref Range Status   Specimen Description BLOOD LEFT HAND  Final   Special Requests   Final    BOTTLES DRAWN  AEROBIC ONLY Blood Culture results  may not be optimal due to an inadequate volume of blood received in culture bottles   Culture   Final    NO GROWTH 1 DAY Performed at [facility], [place] [address] [place], [place] [address]    Report Status PENDING  Incomplete  Culture, blood (routine x 2) Call MD if unable to obtain prior to antibiotics being given     Status: None (Preliminary result)   Collection Time: 07/05/17 12:50 AM  Result Value Ref Range Status   Specimen Description BLOOD LEFT HAND  Final   Special Requests   Final    BOTTLES DRAWN AEROBIC ONLY Blood Culture results may not be optimal due to an inadequate volume of blood received in culture bottles   Culture   Final    NO GROWTH 1 DAY Performed at [facility], [place] [address], [place], [place] [address]    Report Status PENDING  Incomplete         Radiology Studies: No results found.      Scheduled Meds: . acetaminophen  650 mg Oral Q6H  . allopurinol  300 mg Oral QHS  . atorvastatin  10 mg Oral Daily  . azithromycin  500 mg Oral Daily  . clopidogrel  75 mg Oral QODAY  . colchicine  0.6 mg Oral Daily  . collagenase   Topical Daily  . docusate sodium  100 mg Oral BID  . furosemide  40 mg Intravenous Q12H  . gabapentin  700 mg Oral Daily  . heparin  5,000 Units Subcutaneous Q8H  . insulin aspart  0-15 Units Subcutaneous TID WC  . insulin aspart  0-5 Units Subcutaneous QHS  . insulin aspart  3 Units Subcutaneous TID WC  . lidocaine  1 patch Transdermal Q24H  . mouth rinse  15 mL Mouth Rinse BID  . predniSONE  10 mg Oral Q breakfast  . prenatal vitamin w/FE, FA  1 tablet Oral Q1200  . sodium bicarbonate  1,300 mg Oral BID   Continuous Infusions: . cefTRIAXone (ROCEPHIN)  IV Stopped (07/07/17 1014)  . methocarbamol (ROBAXIN)  IV       LOS: 3 days    Time spent: over 30 min    Fayrene Helper, MD [facility] Pager [phone]  If 7PM-7AM, please contact  night-coverage [email or web address] Password [facility] 07/07/2017, 11:39 AM

## 2017-07-07 NOTE — Progress Notes (Signed)
Pt states he feels constipated. RN gave pt prune juice. MD paged earlier regarding new order for laxative. No new order at this time.

## 2017-07-07 NOTE — Clinical Social Work Note (Signed)
Clinical Social Work Assessment  Patient Details  Name: Marc [name] MRN: [id number] Date of Birth: April 25, 1940  Date of referral:  07/07/17               Reason for consult:  Facility Placement, Discharge Planning                Permission sought to share information with:  Chartered certified accountant granted to share information::  Yes, Verbal Permission Granted  Name::        Agency::  [facility]  Relationship::     Contact Information:     Housing/Transportation Living arrangements for the past 2 months:  Single Family Home Source of Information:  Patient, Medical Team Patient Interpreter Needed:  None Criminal Activity/Legal Involvement Pertinent to Current Situation/Hospitalization:  No - Comment as needed Significant Relationships:  Spouse, Siblings Lives with:  Spouse Do you feel safe going back to the place where you live?  Yes Need for family participation in patient care:  Yes (Comment)  Care giving concerns:  PT recommending SNF once medically stable for discharge.   Social Worker assessment / plan:  CSW met with patient. No supports at bedside. CSW introduced role and explained that PT recommendations would be discussed. Patient stated he is not interested in SNF but might have to go. [facility] is his one and only preference. Discussed sending out referral to other local facilities in case [facility] cannot take him. Patient stated if he cannot get into [facility] he will "go to the house." CSW notified [facility] admissions coordinator that referral had been sent over. No further concerns. CSW encouraged patient to contact CSW as needed. CSW will continue to follow patient for support and facilitate discharge to SNF once medically stable.  Employment status:  Retired Nurse, adult PT Recommendations:  [place] / Referral to community resources:  [place]  Patient/Family's Response to care:  Patient agreeable to go to only [facility] SNF. Patient's wife and brother supportive and involved in patient's care. Patient appreciated social work intervention.  Patient/Family's Understanding of and Emotional Response to Diagnosis, Current Treatment, and Prognosis:  Patient has a good understanding of the reason for admission and need for rehab prior to returning home. Patient appears pleased with hospital care.  Emotional Assessment Appearance:  Appears stated age Attitude/Demeanor/Rapport:  Engaged Affect (typically observed):  Accepting, Appropriate, Calm, Pleasant Orientation:  Oriented to Self, Oriented to Place, Oriented to  Time, Oriented to Situation Alcohol / Substance use:  Never Used Psych involvement (Current and /or in the community):  No (Comment)  Discharge Needs  Concerns to be addressed:  Care Coordination Readmission within the last 30 days:  No Current discharge risk:  Dependent with Mobility Barriers to Discharge:  Continued Medical Work up, [place], LCSW 07/07/2017, 9:50 AM

## 2017-07-07 NOTE — Progress Notes (Signed)
RN changed sacrum dressing per order.

## 2017-07-07 NOTE — Progress Notes (Signed)
Occupational Therapy Treatment Patient Details Name: Marc [name] MRN: [id number] DOB: Mar 02, 1940 Today's Date: 07/07/2017    History of present illness Marc [name] is a 77 y.o. male with history of CAD, chronic kidney disease, diabetes mellitus, CHF, hypertension presents to the ER the patient has benign persistent left-sided chest pain after fall 5 days ago.  He has noticed increasing swelling in the lower extremity since fall due to not taking his lasix. Also patient developed decubitus ulcer in the sacral area.   OT comments  Upon arrival, pt sidelying in bed and stating "I am trying to urinate and defecate." Pt requiring Mod A to transfer from E[facility] to [facility] and Mod A for standing balance during toilet hygiene. Pt reporting significant abdominal pain and requesting a laxative; RN notified. Continue to recommend dc to SNF and will continue to follow acutely as admitted.    Follow Up Recommendations  SNF    Equipment Recommendations  Other (comment)(Defer to next venue)    Recommendations for Other Services PT consult    Precautions / Restrictions Precautions Precautions: Fall Restrictions Weight Bearing Restrictions: No       Mobility Bed Mobility Overal bed mobility: Needs Assistance Bed Mobility: Sidelying to Sit   Sidelying to sit: Mod assist       General bed mobility comments: Mod A to elevate trunk  Transfers Overall transfer level: Needs assistance Equipment used: Rolling walker (2 wheeled) Transfers: Stand Pivot Transfers;Sit to/from Stand Sit to Stand: Min assist Stand pivot transfers: Mod assist       General transfer comment: Min A to power up into standing and Mod A for stand pivot to recliner. Requiring Mod A for balance during dynamic movement without RW. VCs for hand placement for RW use after toileting    Balance Overall balance assessment: Needs assistance Sitting-balance support: Feet supported;No upper extremity supported Sitting  balance-Leahy Scale: Fair     Standing balance support: No upper extremity supported Standing balance-Leahy Scale: Poor Standing balance comment: Reliant on UE support                           ADL either performed or assessed with clinical judgement   ADL Overall ADL's : Needs assistance/impaired                         Toilet Transfer: Minimal assistance;Stand-pivot Toilet Transfer Details (indicate cue type and reason): Min A for balance to stand pivot to recliner. Toileting- Clothing Manipulation and Hygiene: Moderate assistance;Sit to/from stand Toileting - Clothing Manipulation Details (indicate cue type and reason): Mod A for standing balance while pt performed peri care     Functional mobility during ADLs: Minimal assistance;Rolling walker General ADL Comments: Upon arrival, pt sidelying in bed and stating "I am jsut trying to urinate and deficate." Pt agreeable to perform toilet transfer. Pt focused on pain and constipation     Vision       Perception     Praxis      Cognition Arousal/Alertness: Awake/alert Behavior During Therapy: Flat affect Overall Cognitive Status: Within Functional Limits for tasks assessed                                          Exercises     Shoulder Instructions       General  Comments SpO2 in low 90s throughout session on 1L O2    Pertinent Vitals/ Pain       Pain Assessment: Faces Faces Pain Scale: Hurts whole lot Pain Location: abdomen/constipation Pain Descriptors / Indicators: Sharp;Cramping;Guarding;Grimacing;Moaning Pain Intervention(s): Monitored during session;Limited activity within patient's tolerance;Repositioned  Home Living                                          Prior Functioning/Environment              Frequency  Min 2X/week        Progress Toward Goals  OT Goals(current goals can now be found in the care plan section)  Progress towards OT  goals: Progressing toward goals  Acute Rehab OT Goals Patient Stated Goal: stop the pain OT Goal Formulation: With patient Time For Goal Achievement: 07/20/17 Potential to Achieve Goals: Good ADL Goals Pt Will Perform Grooming: with set-up;with supervision;standing Pt Will Perform Upper Body Dressing: with set-up;with supervision;sitting Pt Will Perform Lower Body Dressing: with min guard assist;sit to/from stand Pt Will Transfer to Toilet: with min guard assist;ambulating;bedside commode Pt Will Perform Toileting - Clothing Manipulation and hygiene: with min guard assist;sitting/lateral leans;sit to/from stand  Plan Discharge plan remains appropriate    Co-evaluation                 AM-PAC PT "6 Clicks" Daily Activity     Outcome Measure   Help from another person eating meals?: A Little Help from another person taking care of personal grooming?: A Little Help from another person toileting, which includes using toliet, bedpan, or urinal?: A Lot Help from another person bathing (including washing, rinsing, drying)?: A Lot Help from another person to put on and taking off regular upper body clothing?: A Little Help from another person to put on and taking off regular lower body clothing?: A Lot 6 Click Score: 15    End of Session Equipment Utilized During Treatment: Rolling walker;Oxygen  OT Visit Diagnosis: Unsteadiness on feet (R26.81);Other abnormalities of gait and mobility (R26.89);Muscle weakness (generalized) (M62.81);Pain Pain - Right/Left: Left Pain - part of body: (Ribs)   Activity Tolerance Patient tolerated treatment well;Patient limited by pain   Patient Left with call bell/phone within reach;with nursing/sitter in room;in chair;with chair alarm set   Nurse Communication Mobility status        Time: 1152-1209 OT Time Calculation (min): 17 min  Charges: OT General Charges $OT Visit: 1 Visit OT Treatments $Self Care/Home Management : 8-22  mins  [place], OTR/L Acute Rehab Pager: [phone] Office: [place] 07/07/2017, 12:57 PM

## 2017-07-07 NOTE — Plan of Care (Signed)
?  Problem: Activity: ?Goal: Capacity to carry out activities will improve ?Outcome: Progressing ?  ?

## 2017-07-07 NOTE — Progress Notes (Addendum)
Inpatient Diabetes Program Recommendations  AACE/ADA: New Consensus Statement on Inpatient Glycemic Control (2019)  Target Ranges:  Prepandial:   less than 140 mg/dL      Peak postprandial:   less than 180 mg/dL (1-2 hours)      Critically ill patients:  140 - 180 mg/dL  Results for Marc [name], Marc [name] (MRN [id number]) as of 07/07/2017 12:18  Ref. Range 07/05/2017 07:21 07/05/2017 12:23 07/05/2017 16:41 07/05/2017 21:10 07/06/2017 07:17 07/06/2017 12:21 07/06/2017 16:40 07/06/2017 21:15 07/07/2017 07:30 07/07/2017 11:32  Glucose-Capillary Latest Ref Range: 65 - 99 mg/dL 240 (H) 156 (H) 201 (H) 225 (H) 245 (H) 152 (H) 141 (H) 341 (H) 130 (H) 317 (H)   Results for Marc [name], Marc [name] (MRN [id number]) as of 07/07/2017 12:18  Ref. Range 09/18/2016 08:56 07/05/2017 08:48  Hemoglobin A1C Latest Ref Range: 4.8 - 5.6 % 6.4 (H) 8.9 (H)   Review of Glycemic Control  Diabetes history:DM2 Outpatient Diabetes medications: Amaryl 4 mg 1-2 times per day (takes once in the morning and if glucose over 150 mg/dl later in the day he takes second dose)  Current orders for Inpatient glycemic control: Novolog 0-15 units TID with meals, Novolog 0-5 units QHS, Novolog 3 units TID with meals for meal coverage  Inpatient Diabetes Program Recommendations: HgbA1C: A1C 8.9% on 07/05/17 indicating an average glucose of 209 mg/dl over the past 2-3 months. In reviewing chart, noted patient use to take Amaryl 8 mg QAM but no DM medications listed on home medication list at this time. If patient is not taking DM medications as an outpatient, anticipate patient needs to be prescribed DM medications at time of discharge.  NOTE: Per discharge summary on 03/11/17, Amaryl 8 mg daily with breakfast was continued at time of discharge.  Addendum 07/07/17@14 :50-Spoke with patient about diabetes and home regimen for diabetes control. Patient reports that he is followed by PCP for diabetes management and currently he takes Amaryl (glimeperide) 4 mg once a day in  the morning and if his glucose is over 150 mg/dl later in the day he takes a second dose of Amaryl 4 mg.  Patient reports that he is taking DM as prescribed and that he last seen his PCP on 06/05/17.  Patient states that he checks his glucose 1-2 times per day and that it has been running higher over the past few weeks but does not understand why. Prior to noted elevation, patient reports he was waking up at times with hypoglycemia which he discussed with PCP.  Inquired about prior A1C and patient reports that his last A1C value was in the 6% range. Discussed A1C results (8.9% on 07/05/17) and explained that his current A1C indicates an average glucose of 209 mg/dl over the past 2-3 months. Discussed glucose and A1C goals. Discussed importance of checking CBGs and maintaining good CBG control to prevent long-term and short-term complications.  Discussed impact of nutrition, exercise, stress, sickness, and medications on diabetes control. In further talking with patient and asking about steroids (Prednisone), patient reports that he was started on Prednisone about 4 weeks ago at his last PCP visit.  Explained that recent Prednisone use has contributed to hyperglycemia and elevated A1C. Informed patient that he is also ordered Prednisone as an inpatient which is contributing to hyperglycemia. Encouraged patient to continue checking glucose at home and to contact PCP if his glucose continues to be consistently elevated for further advice from his PCP. Patient verbalized understanding of information discussed and he states that he has no  further questions at this time related to diabetes.  Thanks, Barnie Alderman, RN, MSN, CDE Diabetes Coordinator Inpatient Diabetes Program [phone] (Team Pager from 8am to 5pm)

## 2017-07-08 LAB — GLUCOSE, CAPILLARY
GLUCOSE-CAPILLARY: 228 mg/dL — AB (ref 65–99)
Glucose-Capillary: 168 mg/dL — ABNORMAL HIGH (ref 65–99)
Glucose-Capillary: 91 mg/dL (ref 65–99)
Glucose-Capillary: 323 mg/dL — ABNORMAL HIGH (ref 65–99)

## 2017-07-08 LAB — CBC
HEMATOCRIT: 36.1 % — AB (ref 39.0–52.0)
HEMOGLOBIN: 11.9 g/dL — AB (ref 13.0–17.0)
MCH: 32.3 pg (ref 26.0–34.0)
MCHC: 33 g/dL (ref 30.0–36.0)
MCV: 98.1 fL (ref 78.0–100.0)
Platelets: 348 10*3/uL (ref 150–400)
RBC: 3.68 MIL/uL — AB (ref 4.22–5.81)
RDW: 13 % (ref 11.5–15.5)
WBC: 7.3 10*3/uL (ref 4.0–10.5)

## 2017-07-08 LAB — COMPREHENSIVE METABOLIC PANEL
ALBUMIN: 2.8 g/dL — AB (ref 3.5–5.0)
ALT: 9 U/L — AB (ref 17–63)
AST: 21 U/L (ref 15–41)
Alkaline Phosphatase: 128 U/L — ABNORMAL HIGH (ref 38–126)
Anion gap: 13 (ref 5–15)
BILIRUBIN TOTAL: 0.4 mg/dL (ref 0.3–1.2)
BUN: 57 mg/dL — ABNORMAL HIGH (ref 6–20)
CO2: 33 mmol/L — ABNORMAL HIGH (ref 22–32)
CREATININE: 1.84 mg/dL — AB (ref 0.61–1.24)
Calcium: 9.2 mg/dL (ref 8.9–10.3)
Chloride: 96 mmol/L — ABNORMAL LOW (ref 101–111)
GFR calc Af Amer: 39 mL/min — ABNORMAL LOW (ref >60)
GFR, EST NON AFRICAN AMERICAN: 34 mL/min — AB (ref >60)
GLUCOSE: 228 mg/dL — AB (ref 65–99)
POTASSIUM: 3.9 mmol/L (ref 3.5–5.1)
Sodium: 142 mmol/L (ref 135–145)
TOTAL PROTEIN: 6.3 g/dL — AB (ref 6.5–8.1)

## 2017-07-08 LAB — MAGNESIUM: MAGNESIUM: 2 mg/dL (ref 1.7–2.4)

## 2017-07-08 MED ORDER — INSULIN GLARGINE 100 UNIT/ML ~~LOC~~ SOLN
7.0000 [IU] | Freq: Every day | SUBCUTANEOUS | Status: DC
Start: 2017-07-08 — End: 2017-07-11
  Administered 2017-07-08 – 2017-07-10 (×3): 7 [IU] via SUBCUTANEOUS
  Filled 2017-07-08 (×3): qty 0.07

## 2017-07-08 MED ORDER — GABAPENTIN 400 MG PO CAPS
700.0000 mg | ORAL_CAPSULE | Freq: Every day | ORAL | Status: DC
  Administered 2017-07-08 – 2017-07-11 (×4): 700 mg via ORAL
  Filled 2017-07-08 (×4): qty 1

## 2017-07-08 MED ORDER — SODIUM BICARBONATE 650 MG PO TABS: 650.0000 mg | ORAL_TABLET | Freq: Two times a day (BID) | ORAL | Status: DC

## 2017-07-08 MED ORDER — INSULIN GLARGINE 100 UNIT/ML ~~LOC~~ SOLN: 5.0000 [IU] | Freq: Every day | SUBCUTANEOUS | Status: DC

## 2017-07-08 MED ORDER — MUPIROCIN 2 % EX OINT
1.0000 "application " | TOPICAL_OINTMENT | Freq: Two times a day (BID) | CUTANEOUS | Status: DC
  Administered 2017-07-08 – 2017-07-11 (×6): 1 via NASAL
  Filled 2017-07-08: qty 22

## 2017-07-08 NOTE — Care Management Important Message (Signed)
Important Message  Patient Details  Name: Marc [name] MRN: [id number] Date of Birth: 13-Apr-1940   Medicare Important Message Given:  Yes    Genevieve Ritzel P [place] 07/08/2017, 3:45 PM

## 2017-07-08 NOTE — Progress Notes (Signed)
Inpatient Diabetes Program Recommendations  AACE/ADA: New Consensus Statement on Inpatient Glycemic Control (2019)  Target Ranges:  Prepandial:   less than 140 mg/dL      Peak postprandial:   less than 180 mg/dL (1-2 hours)      Critically ill patients:  140 - 180 mg/dL   Results for Marc [name], Marc [name] (MRN [id number]) as of 07/08/2017 08:24  Ref. Range 07/07/2017 07:30 07/07/2017 11:32 07/07/2017 16:21 07/07/2017 21:20 07/08/2017 07:40  Glucose-Capillary Latest Ref Range: 65 - 99 mg/dL 130 (H) 317 (H) 364 (H) 190 (H) 228 (H)   Review of Glycemic Control  Diabetes history:DM2 Outpatient Diabetes medications: Amaryl 4 mg 1-2 times per day (takes once in the morning and if glucose over 150 mg/dl later in the day he takes second dose)  Current orders for Inpatient glycemic control: Novolog 0-15 units TID with meals, Novolog 0-5 units QHS, Novolog 3 units TID with meals for meal coverage; Prednisone 10 mg QAM  Inpatient Diabetes Program Recommendations: Insulin - Basal: While inpatient, please consider ordering Lantus 7 units daily (based on 70 kg x 0.1 units).  Thanks, Barnie Alderman, RN, MSN, CDE Diabetes Coordinator Inpatient Diabetes Program [phone] (Team Pager from 8am to 5pm)

## 2017-07-08 NOTE — Progress Notes (Addendum)
PROGRESS NOTE    Marc [name]  YWV:[id number] DOB: [birth year] DOA: 07/04/2017 PCP: [name], MD   Brief Narrative:  Marc [name] is Marc [name] 77 y.o. male with history of CAD, chronic kidney disease, diabetes mellitus, CHF, hypertension presents to the ER the patient has benign persistent left-sided chest pain after fall 5 days ago.  Denies hitting his head or losing consciousness.  Patient states he was walking when he suddenly slipped and fell.  Since the fall patient also has not taken his Lasix since he was unable to make it to the bathroom.  He has noticed increasing swelling in the lower extremity since fall.  Also patient developed decubitus ulcer in the sacral area.  Assessment & Plan:   Principal Problem:   Closed fracture of rib of left side Active Problems:   Peripheral vascular disease with claudication (HCC)   Chronic kidney disease stage 3   CAD (coronary artery disease), native coronary artery   Ascending aortic aneurysm (HCC)   Type 2 diabetes mellitus with renal complication (HCC)   Acute on chronic diastolic CHF (congestive heart failure) (HCC)   Acute CHF (congestive heart failure) (HCC)   Pressure injury of skin   1. Acute Hypoxic Respiratory Failure: likely 2/2 splinting and possible pneumonia as noted on imaging below.  Required O2 to be added on 6/8 1. Follow CXR - rib fractures, atelectasis seen on 6/8 cxr. 2. Continue pain control, incentive spirometer 3. Continuous pulse ox 4. Abx as below 5. Wean as tolerated - was doing ok on RA previously, but now back on O2.  Will continue to try to wean.     2. Left 5th through 12th Rib Fractures:  After mechanical fall.   1. Continue IS 2. Fentanyl, oxycodone, scheduled APAP, lidocaine patch, robaxin.  Adjust as needed. (of note, looks like he was asking for additional pain meds previously - looks like he gets oxy 15 mg at home on PMP aware - but current opiate dosing seems to be doing ok for him - he's  actually getting sleepy with this dose on discussion with nursing. 3. Consider trauma consult (discussed over phone 6/8 and noted likely nothing they would add - will consider discussing again if problems) 4. Physical therapy, OT  3. Acute on chronic CHF.  With worsening lower extremity edema (R>L which is chronic).  He notes not taking his HF meds in the period after the fall. 1. Follow echo (EF 85-46%, grade 1 diastolic dysfunction - see report) 2. Continue IV lasix -> was taking 80 at home, will increase to 60 IV BID here here 3. Follow LE [place] (negative prelim, follow) 4. Follow troponin (negative) 5. I/O, daily weight - Good UOP yesterday, weight actually up, but not sure how accurate this is?  Wt Readings from Last 3 Encounters:  07/08/17 70.9 kg (156 lb 6.4 oz)  04/23/17 74.8 kg (165 lb)  03/09/17 76.2 kg (167 lb 15.9 oz)   4. Stage III decubitus ulcer on the sacral area -wound team consult, appreciate recs.  Frequent repositioning.  5. Possible CAP:  1. Continue ceftriaxone/azithromycin 2. Sputum cx, blood cx 3. Urine strep (negative), urine legionella (negative) 4. Follow procalcitonin (elevated)  6. Hypertension - Holding lisinopril with CKD. Holding metoprolol with bradycardia.  7. CAD and peripheral vascular disease on statins and antiplatelet agents.  Holding metoprolol.  8. Diabetes mellitus type 2 on sliding scale coverage for now.  AM bg reasonable, but elevated mealtime, will add mealtime insulin.  Will start 7 units qhs lantus.  Follow A1c (8.9).  Will need meds at discharge - has amaryl on med list, but he noted he's not taking any DM meds as outpatient (apparently recently started on prednisone below which is likely contributing to worsening BGs).  9. Ascending aortic aneurysm -denies any chest pain.  10. Chronic kidney disease stage III creatinine appears to be at baseline (improving with diuresis) 1. Follow with diuresis above  2. Decreasing gabapentin dose due  to chronic kidney disease and also patient was mildly lethargic on admission as noticed by ER physician.  3. On sodium bicarb, but has metabolic alkalosis, will stop and continue to monitor  11. 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 Sinus Bradycardia: not on any AV nodal blocking agents (metop is being held).  EKG's with 1 degree AV block (one EKG with AV nodal dissociation, this appeared sinus and I reviewed it with cards who agreed).  Continue monitoring on tele.  Looks like this is primarily when he's sleeping.  13. Prolonged QTC -avoid QTC prolonging medications.  Repeat EKG in AM (improved)  14. 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  15. Thoracic Aorta Aneurysm: needs follow up imaging, CT surgery follow up  16. Dysuria  Hematuria: UA doesn't appear infected, but his is symptomatic and has hematuria.  Will follow culture (pending).  If negative, needs follow up for hematuria.   17. On prednisone 10 mg daily for pain per his report  DVT prophylaxis: heparin Code Status: dnr  Family Communication: none at bedside Disposition Plan: SNF pending improvement   Consultants:   none  Procedures:  [place] LE Final Interpretation: Right: There is no evidence of deep vein thrombosis in the lower extremity.There is no evidence of superficial venous thrombosis. Left: There is no evidence of deep vein thrombosis in the lower extremity.There is no evidence of superficial venous thrombosis.  Echo Study Conclusions  - Left ventricle: The cavity size was normal. Systolic function was   normal. The estimated ejection fraction was in the range of 55%   to 60%. Wall motion was normal; there were no regional wall   motion abnormalities. Doppler parameters are consistent with   abnormal left ventricular relaxation (grade 1 diastolic   dysfunction). There was no evidence of elevated ventricular   filling pressure by Doppler parameters. - Aortic valve: There was no regurgitation. -  Mitral valve: There was trivial regurgitation. - Left atrium: The atrium was mildly dilated. - Right ventricle: Systolic function was normal. - Right atrium: The atrium was normal in size. - Tricuspid valve: There was trivial regurgitation. - Pulmonary arteries: Systolic pressure was within the normal   range. - Inferior vena cava: The vessel was dilated. The respirophasic   diameter changes were blunted (< 50%), consistent with elevated   central venous pressure. - Pericardium, extracardiac: There was no pericardial effusion.   Antimicrobials:  Anti-infectives (From admission, onward)   Start     Dose/Rate Route Frequency Ordered Stop   07/07/17 1000  azithromycin (ZITHROMAX) tablet 500 mg     500 mg Oral Daily 07/06/17 0918 07/12/17 0959   07/05/17 1000  cefTRIAXone (ROCEPHIN) 1 g in sodium chloride 0.9 % 100 mL IVPB     1 g 200 mL/hr over 30 Minutes Intravenous Every 24 hours 07/04/17 2210 07/12/17 0959   07/05/17 1000  azithromycin (ZITHROMAX) 500 mg in sodium chloride 0.9 % 250 mL IVPB  Status:  Discontinued     500 mg 250  mL/hr over 60 Minutes Intravenous Every 24 hours 07/04/17 2210 07/06/17 0918   07/04/17 1815  azithromycin (ZITHROMAX) 500 mg in sodium chloride 0.9 % 250 mL IVPB     500 mg 250 mL/hr over 60 Minutes Intravenous  Once 07/04/17 1806 07/04/17 2152   07/04/17 1815  cefTRIAXone (ROCEPHIN) 1 g in sodium chloride 0.9 % 100 mL IVPB     1 g 200 mL/hr over 30 Minutes Intravenous  Once 07/04/17 1806 07/04/17 1955     Subjective: Doing ok. Not sure what his "dry weight is" Describes dysuria.  Objective: Vitals:   07/07/17 1943 07/08/17 0450 07/08/17 1615 07/08/17 1916  BP: (!) 128/96 (!) 143/79 122/69 127/72  Pulse: 86 90 (!) 47 86  Resp: 20 18 14    Temp: 98 F (36.7 C) 98.2 F (36.8 C) 98.6 F (37 C) 98.4 F (36.9 C)  TempSrc: Oral Oral Oral Oral  SpO2: 99% 99% 100% 94%  Weight:  70.9 kg (156 lb 6.4 oz)    Height:        Intake/Output Summary (Last  24 hours) at 07/08/2017 1934 Last data filed at 07/08/2017 1900 Gross per 24 hour  Intake 840 ml  Output 1850 ml  Net -1010 ml   Filed Weights   07/06/17 0539 07/07/17 0600 07/08/17 0450  Weight: 68.9 kg (152 lb) 69.4 kg (153 lb) 70.9 kg (156 lb 6.4 oz)    Examination:  General: No acute distress. Cardiovascular: Heart sounds show Presten Joost regular rate, and rhythm. No gallops or rubs. No murmurs. No JVD. Lungs: Clear to auscultation bilaterally with good air movement. No rales, rhonchi or wheezes. Abdomen: Soft, nontender, nondistended with normal active bowel sounds. No masses. No hepatosplenomegaly. Neurological: Alert and oriented 3. Moves all extremities 4. Cranial nerves II through XII grossly intact. Skin: stage 3 decub Extremities: LEE Psychiatric: Mood and affect are normal. Insight and judgment are appropriate.   Data Reviewed: I have personally reviewed following labs and imaging studies  CBC: Recent Labs  Lab 07/04/17 1428 07/05/17 0702 07/06/17 0802 07/07/17 0515 07/08/17 0641  WBC 9.3 7.3 5.7 6.1 7.3  NEUTROABS 7.9*  --   --   --   --   HGB 11.9* 12.2* 10.5* 11.1* 11.9*  HCT 35.9* 37.4* 32.5* 33.9* 36.1*  MCV 97.3 97.9 98.5 99.4 98.1  PLT 343 301 300 305 403   Basic Metabolic Panel: Recent Labs  Lab 07/04/17 1428 07/05/17 0702 07/06/17 0802 07/07/17 0515 07/08/17 0641  NA 137 138 137 141 142  K 4.3 3.9 3.6 3.7 3.9  CL 97* 97* 97* 98* 96*  CO2 24 26 30  33* 33*  GLUCOSE 342* 241* 244* 128* 228*  BUN 73* 66* 63* 65* 57*  CREATININE 2.95* 2.22* 2.11* 1.97* 1.84*  CALCIUM 9.5 9.2 8.7* 9.0 9.2  MG  --  2.3 2.1 2.2 2.0   GFR: Estimated Creatinine Clearance: 33.7 mL/min (Rionna Felt[place]) (by C-G formula based on SCr of 1.84 mg/dL (H)). Liver Function Tests: Recent Labs  Lab 07/04/17 1428 07/05/17 0702 07/06/17 0802 07/08/17 0641  AST 18 20 16 21   ALT 8* 6* 8* 9*  ALKPHOS 152* 138* 112 128*  BILITOT 0.8 0.5 0.6 0.4  PROT 7.4 6.8 5.9* 6.3*  ALBUMIN 3.1* 2.8*  2.6* 2.8*   No results for input(s): LIPASE, AMYLASE in the last 168 hours. Recent Labs  Lab 07/04/17 1428  AMMONIA 26   Coagulation Profile: Recent Labs  Lab 07/04/17 1428  INR 1.21   Cardiac Enzymes:  Recent Labs  Lab 07/04/17 1428 07/05/17 0702  CKTOTAL 106  --   TROPONINI <0.03 <0.03   BNP (last 3 results) No results for input(s): PROBNP in the last 8760 hours. HbA1C: No results for input(s): HGBA1C in the last 72 hours. CBG: Recent Labs  Lab 07/07/17 1621 07/07/17 2120 07/08/17 0740 07/08/17 1140 07/08/17 1557  GLUCAP 364* 190* 228* 168* 91   Lipid Profile: No results for input(s): CHOL, HDL, LDLCALC, TRIG, CHOLHDL, LDLDIRECT in the last 72 hours. Thyroid Function Tests: No results for input(s): TSH, T4TOTAL, FREET4, T3FREE, THYROIDAB in the last 72 hours. Anemia Panel: No results for input(s): VITAMINB12, FOLATE, FERRITIN, TIBC, IRON, RETICCTPCT in the last 72 hours. Sepsis Labs: Recent Labs  Lab 07/04/17 1428 07/04/17 1440 07/05/17 0702  PROCALCITON  --   --  0.41  LATICACIDVEN 1.7 1.81  --     Recent Results (from the past 240 hour(s))  MRSA PCR Screening     Status: None   Collection Time: 07/04/17 11:14 PM  Result Value Ref Range Status   MRSA by PCR NEGATIVE NEGATIVE Final    Comment:        The GeneXpert MRSA Assay (FDA approved for NASAL specimens only), is one component of [facility] comprehensive MRSA colonization surveillance program. It is not intended to diagnose MRSA infection nor to guide or monitor treatment for MRSA infections. Performed at [facility], [place] [address] [place], [place] [address]   Culture, blood (routine x 2) Call MD if unable to obtain prior to antibiotics being given     Status: None (Preliminary result)   Collection Time: 07/05/17 12:45 AM  Result Value Ref Range Status   Specimen Description BLOOD LEFT HAND  Final   Special Requests   Final    BOTTLES DRAWN AEROBIC ONLY Blood Culture results may not be  optimal due to an inadequate volume of blood received in culture bottles   Culture   Final    NO GROWTH 3 DAYS Performed at [facility], [place] [address], [place], [place] [address]    Report Status PENDING  Incomplete  Culture, blood (routine x 2) Call MD if unable to obtain prior to antibiotics being given     Status: None (Preliminary result)   Collection Time: 07/05/17 12:50 AM  Result Value Ref Range Status   Specimen Description BLOOD LEFT HAND  Final   Special Requests   Final    BOTTLES DRAWN AEROBIC ONLY Blood Culture results may not be optimal due to an inadequate volume of blood received in culture bottles   Culture   Final    NO GROWTH 3 DAYS Performed at [facility], [place] [address] [place], [place] [address]    Report Status PENDING  Incomplete         Radiology Studies: No results found.      Scheduled Meds: . acetaminophen  650 mg Oral Q6H  . allopurinol  300 mg Oral QHS  . atorvastatin  10 mg Oral Daily  . azithromycin  500 mg Oral Daily  . clopidogrel  75 mg Oral QODAY  . colchicine  0.6 mg Oral Daily  . collagenase   Topical Daily  . docusate sodium  100 mg Oral BID  . furosemide  60 mg Intravenous Q12H  . gabapentin  700 mg Oral Daily  . heparin  5,000 Units Subcutaneous Q8H  . insulin aspart  0-15 Units Subcutaneous TID WC  . insulin aspart  0-5 Units Subcutaneous QHS  . insulin aspart  3 Units Subcutaneous TID WC  . insulin glargine  7 Units Subcutaneous QHS  . lidocaine  1 patch Transdermal Q24H  . mouth rinse  15 mL Mouth Rinse BID  . mupirocin ointment  1 application Nasal BID  . polyethylene glycol  17 g Oral BID  . predniSONE  10 mg Oral Q breakfast  . prenatal vitamin w/FE, FA  1 tablet Oral Q1200  . sodium bicarbonate  1,300 mg Oral BID   Continuous Infusions: . cefTRIAXone (ROCEPHIN)  IV Stopped (07/08/17 1130)  . methocarbamol (ROBAXIN)  IV       LOS: 4 days    Time spent: over 30 min    Fayrene Helper,  MD [facility] Pager [phone]  If 7PM-7AM, please contact night-coverage [email or web address] Password [facility] 07/08/2017, 7:34 PM

## 2017-07-09 ENCOUNTER — Other Ambulatory Visit: Payer: Self-pay

## 2017-07-09 DIAGNOSIS — S2242XA Multiple fractures of ribs, left side, initial encounter for closed fracture: Secondary | ICD-10-CM

## 2017-07-09 DIAGNOSIS — N183 Chronic kidney disease, stage 3 (moderate): Secondary | ICD-10-CM

## 2017-07-09 DIAGNOSIS — I739 Peripheral vascular disease, unspecified: Secondary | ICD-10-CM

## 2017-07-09 DIAGNOSIS — I495 Sick sinus syndrome: Secondary | ICD-10-CM

## 2017-07-09 DIAGNOSIS — I5033 Acute on chronic diastolic (congestive) heart failure: Secondary | ICD-10-CM

## 2017-07-09 DIAGNOSIS — I712 Thoracic aortic aneurysm, without rupture: Secondary | ICD-10-CM

## 2017-07-09 DIAGNOSIS — I471 Supraventricular tachycardia: Secondary | ICD-10-CM

## 2017-07-09 DIAGNOSIS — I509 Heart failure, unspecified: Secondary | ICD-10-CM

## 2017-07-09 DIAGNOSIS — I2510 Atherosclerotic heart disease of native coronary artery without angina pectoris: Secondary | ICD-10-CM

## 2017-07-09 DIAGNOSIS — E1122 Type 2 diabetes mellitus with diabetic chronic kidney disease: Secondary | ICD-10-CM

## 2017-07-09 LAB — URINALYSIS, ROUTINE W REFLEX MICROSCOPIC
Bilirubin Urine: NEGATIVE
Glucose, UA: NEGATIVE mg/dL
Ketones, ur: NEGATIVE mg/dL
Leukocytes, UA: NEGATIVE
Nitrite: NEGATIVE
Protein, ur: NEGATIVE mg/dL
Specific Gravity, Urine: 1.011 (ref 1.005–1.030)
pH: 6 (ref 5.0–8.0)

## 2017-07-09 LAB — MAGNESIUM: MAGNESIUM: 2 mg/dL (ref 1.7–2.4)

## 2017-07-09 LAB — BASIC METABOLIC PANEL
Anion gap: 13 (ref 5–15)
BUN: 57 mg/dL — AB (ref 6–20)
CO2: 33 mmol/L — ABNORMAL HIGH (ref 22–32)
CREATININE: 1.77 mg/dL — AB (ref 0.61–1.24)
Calcium: 9.4 mg/dL (ref 8.9–10.3)
Chloride: 96 mmol/L — ABNORMAL LOW (ref 101–111)
GFR, EST AFRICAN AMERICAN: 41 mL/min — AB (ref >60)
GFR, EST NON AFRICAN AMERICAN: 35 mL/min — AB (ref >60)
Glucose, Bld: 108 mg/dL — ABNORMAL HIGH (ref 65–99)
POTASSIUM: 3.5 mmol/L (ref 3.5–5.1)
SODIUM: 142 mmol/L (ref 135–145)

## 2017-07-09 LAB — CBC
HCT: 35.8 % — ABNORMAL LOW (ref 39.0–52.0)
Hemoglobin: 11.7 g/dL — ABNORMAL LOW (ref 13.0–17.0)
MCH: 32.1 pg (ref 26.0–34.0)
MCHC: 32.7 g/dL (ref 30.0–36.0)
MCV: 98.4 fL (ref 78.0–100.0)
PLATELETS: 341 10*3/uL (ref 150–400)
RBC: 3.64 MIL/uL — ABNORMAL LOW (ref 4.22–5.81)
RDW: 13.2 % (ref 11.5–15.5)
WBC: 10.3 10*3/uL (ref 4.0–10.5)

## 2017-07-09 LAB — GLUCOSE, CAPILLARY
GLUCOSE-CAPILLARY: 124 mg/dL — AB (ref 65–99)
Glucose-Capillary: 238 mg/dL — ABNORMAL HIGH (ref 65–99)
Glucose-Capillary: 241 mg/dL — ABNORMAL HIGH (ref 65–99)

## 2017-07-09 MED ORDER — METOPROLOL TARTRATE 25 MG PO TABS
25.0000 mg | ORAL_TABLET | Freq: Two times a day (BID) | ORAL | Status: DC
  Administered 2017-07-09 (×2): 25 mg via ORAL
  Filled 2017-07-09 (×2): qty 1

## 2017-07-09 NOTE — Progress Notes (Signed)
Notified MD Bonner Puna regarding patient being sinus tach/?a.fib, patient asymptomatic at this time, EKG obtained, awaiting further orders Neta Mends RN 10:14 AM 07-09-2017

## 2017-07-09 NOTE — Progress Notes (Addendum)
PROGRESS NOTE  MARKEEM [name]  IEP:[id number] DOB: 16-May-1940 DOA: 07/04/2017 PCP: [name], MD   Brief Narrative: Marc [name] Herndonis a 77 y.o.malewithhistory of CAD, PVD, chronic HFpEF, stage III CKD, T2DM and HTN who presented to the ED with persistent left-sided chest pain after fall 5 days ago. He stopped lasix due to inability to get to the bathroom due to pain and noted increased leg swelling and an ulcer in the sacral area during this time. He was noted to be hypoxic. Evaluation showed several rib fractures and has undergone treatment for acute on chronic CHF and possible CAP. Initial ECG demonstrated 1st deg AVB and low dose metoprolol was held. On 6/12, the patient developed SVT, so cardiology is consulted and metoprolol is restarted.  Assessment & Plan: Principal Problem:   Closed fracture of rib of left side Active Problems:   Peripheral vascular disease with claudication (HCC)   Chronic kidney disease stage 3   CAD (coronary artery disease), native coronary artery   Ascending aortic aneurysm (HCC)   Type 2 diabetes mellitus with renal complication (HCC)   Acute on chronic diastolic CHF (congestive heart failure) (HCC)   Acute CHF (congestive heart failure) (HCC)   Pressure injury of skin  Acute hypoxic respiratory failure: Due to acute CHF, pneumonia, and decreased expansion due to splinting/atelectasis.  - Continue supplemental oxygen as needed while treating conditions below  Acute on chronic diastolic CHF, CAD, PVD: Due to stopping lasix at home. Echo showed EF 55-60%, G1DD. U/S checked for LE edema, negative. Tn negative. - Continue lasix 60mg  IV BID. - Strict I/O, daily weight. Wt down ~20lbs and creatinine continues to improve. - Continue plavix, statin  Paroxysmal SVT: Also with intermittent sinus tachycardia and PACs noted on telemetry and ECGs today. Metoprolol initially held due to sinus bradycardia/1st deg AVB. - Restart metoprolol, though dose  was so low, doubt this is rebound from discontinuation. - I have reached out to cardiology for recommendations.   Acute left 5th-12th rib fractures: Due to mechanical fall. Case discussed with trauma surgery who recommended no changes to management. - Continue pain control for rib fx's and incentive spirometry/OOB.  - Continue PT/OT, dispo planned to SNF once stable  RUL CAP: Urine Ag's negative.  - Continue ceftriaxone 6/7 - 6/13 (1 more dose). Completed course of azithromycin (6/7-6/12) - Monitor sputum culture and blood cultures (NGTD).   Dysuria: Has had condom catheter due to immobility. UA does not appear grossly infected, though he's receiving CTX regardless. Hemoglobinuria noted. No stones, hydro or focal lesion on non-con CT abd/pelvis on 6/7. - Monitor urine culture - Unable to add pyridium with CrCl <54ml/min - Recommend outpatient urology follow up for hematuria.   T2DM:  - Started insulin, which may need to be continued at discharge. ?Hyperglycemia worsened by low dose prednisone.   Stage III CKD: SCr at/below baseline and downtrending.  - Monitor - Renally dose medications (decreased gabapentin) - Stopped bicarb due to metabolic alkalosis (possibly due to contraction)  Anemia of chronic renal disease: Stable. Monitor.   Stage III sacral decubitus ulcer:  - Offload as able, WOC consulted  HTN:  - Held lisinopril w/CKD while diuresing, restart metoprolol.  Ascending aortic aneurysm: - Follow up with Dr. Servando Snare as outpatient  DVT prophylaxis: Heparin Code Status: DNR Family Communication: None at bedside Disposition Plan: SNF when medically stable.  Consultants:   Cardiology  Procedures:  [place] LE Final Interpretation: Right: There is no evidence of deep vein thrombosis  in the lower extremity.There is no evidence of superficial venous thrombosis. Left: There is no evidence of deep vein thrombosis in the lower extremity.There is no evidence of superficial  venous thrombosis.  Echo Study Conclusions  - Left ventricle: The cavity size was normal. Systolic function was normal. The estimated ejection fraction was in the range of 55% to 60%. Wall motion was normal; there were no regional wall motion abnormalities. Doppler parameters are consistent with abnormal left ventricular relaxation (grade 1 diastolic dysfunction). There was no evidence of elevated ventricular filling pressure by Doppler parameters. - Aortic valve: There was no regurgitation. - Mitral valve: There was trivial regurgitation. - Left atrium: The atrium was mildly dilated. - Right ventricle: Systolic function was normal. - Right atrium: The atrium was normal in size. - Tricuspid valve: There was trivial regurgitation. - Pulmonary arteries: Systolic pressure was within the normal range. - Inferior vena cava: The vessel was dilated. The respirophasic diameter changes were blunted (<50%), consistent with elevated central venous pressure. - Pericardium, extracardiac: There was no pericardial effusion.   Antimicrobials:  Ceftriaxone, azithromycin   Subjective: Pain is improved, but severe at times, better with some positions he's found. No dyspnea or anterior chest pain. Burning with urination continues.  Objective: Vitals:   07/09/17 0334 07/09/17 1257 07/09/17 1406 07/09/17 1412  BP:  130/85 128/67   Pulse:  (!) 101 (!) 124 (!) 151  Resp:  18    Temp:  98 F (36.7 C)    TempSrc:  Oral    SpO2:  98%    Weight: 69.9 kg (154 lb 3.2 oz)     Height: 6\' 1"  (1.854 m)       Intake/Output Summary (Last 24 hours) at 07/09/2017 1503 Last data filed at 07/09/2017 1306 Gross per 24 hour  Intake 720 ml  Output 1454 ml  Net -734 ml   Filed Weights   07/07/17 0600 07/08/17 0450 07/09/17 0334  Weight: 69.4 kg (153 lb) 70.9 kg (156 lb 6.4 oz) 69.9 kg (154 lb 3.2 oz)    Gen: 77 y.o. male in no distress  Pulm: Non-labored breathing, decreased breath  sounds. Clear to auscultation bilaterally.  CV: Regular borderline tachycardia this AM. No murmur, rub, or gallop. No JVD, 1+ pedal edema. GI: Abdomen soft, non-tender, non-distended, with normoactive bowel sounds. No organomegaly or masses felt. Ext: Warm, no deformities Skin: Diffuse UE ecchymoses. Sacral decubitus ulcer dressing soiled. Neuro: Alert and oriented. No focal neurological deficits. Psych: Judgement and insight appear normal. Mood & affect appropriate.   Data Reviewed: I have personally reviewed following labs and imaging studies  CBC: Recent Labs  Lab 07/04/17 1428 07/05/17 0702 07/06/17 0802 07/07/17 0515 07/08/17 0641 07/09/17 0641  WBC 9.3 7.3 5.7 6.1 7.3 10.3  NEUTROABS 7.9*  --   --   --   --   --   HGB 11.9* 12.2* 10.5* 11.1* 11.9* 11.7*  HCT 35.9* 37.4* 32.5* 33.9* 36.1* 35.8*  MCV 97.3 97.9 98.5 99.4 98.1 98.4  PLT 343 301 300 305 348 355   Basic Metabolic Panel: Recent Labs  Lab 07/05/17 0702 07/06/17 0802 07/07/17 0515 07/08/17 0641 07/09/17 0641  NA 138 137 141 142 142  K 3.9 3.6 3.7 3.9 3.5  CL 97* 97* 98* 96* 96*  CO2 26 30 33* 33* 33*  GLUCOSE 241* 244* 128* 228* 108*  BUN 66* 63* 65* 57* 57*  CREATININE 2.22* 2.11* 1.97* 1.84* 1.77*  CALCIUM 9.2 8.7* 9.0 9.2 9.4  MG 2.3 2.1 2.2 2.0 2.0   GFR: Estimated Creatinine Clearance: 34.6 mL/min (A) (by C-G formula based on SCr of 1.77 mg/dL (H)). Liver Function Tests: Recent Labs  Lab 07/04/17 1428 07/05/17 0702 07/06/17 0802 07/08/17 0641  AST 18 20 16 21   ALT 8* 6* 8* 9*  ALKPHOS 152* 138* 112 128*  BILITOT 0.8 0.5 0.6 0.4  PROT 7.4 6.8 5.9* 6.3*  ALBUMIN 3.1* 2.8* 2.6* 2.8*   No results for input(s): LIPASE, AMYLASE in the last 168 hours. Recent Labs  Lab 07/04/17 1428  AMMONIA 26   Coagulation Profile: Recent Labs  Lab 07/04/17 1428  INR 1.21   Cardiac Enzymes: Recent Labs  Lab 07/04/17 1428 07/05/17 0702  CKTOTAL 106  --   TROPONINI <0.03 <0.03   BNP (last 3  results) No results for input(s): PROBNP in the last 8760 hours. HbA1C: No results for input(s): HGBA1C in the last 72 hours. CBG: Recent Labs  Lab 07/08/17 0740 07/08/17 1140 07/08/17 1557 07/08/17 2107 07/09/17 0725  GLUCAP 228* 168* 91 323* 124*   Lipid Profile: No results for input(s): CHOL, HDL, LDLCALC, TRIG, CHOLHDL, LDLDIRECT in the last 72 hours. Thyroid Function Tests: No results for input(s): TSH, T4TOTAL, FREET4, T3FREE, THYROIDAB in the last 72 hours. Anemia Panel: No results for input(s): VITAMINB12, FOLATE, FERRITIN, TIBC, IRON, RETICCTPCT in the last 72 hours. Urine analysis:    Component Value Date/Time   COLORURINE YELLOW 07/08/2017 2340   APPEARANCEUR CLEAR 07/08/2017 2340   LABSPEC 1.011 07/08/2017 2340   PHURINE 6.0 07/08/2017 2340   GLUCOSEU NEGATIVE 07/08/2017 2340   HGBUR LARGE (A) 07/08/2017 2340   BILIRUBINUR NEGATIVE 07/08/2017 2340   KETONESUR NEGATIVE 07/08/2017 2340   PROTEINUR NEGATIVE 07/08/2017 2340   UROBILINOGEN 1.0 01/12/2014 1004   NITRITE NEGATIVE 07/08/2017 2340   LEUKOCYTESUR NEGATIVE 07/08/2017 2340   Recent Results (from the past 240 hour(s))  MRSA PCR Screening     Status: None   Collection Time: 07/04/17 11:14 PM  Result Value Ref Range Status   MRSA by PCR NEGATIVE NEGATIVE Final    Comment:        The GeneXpert MRSA Assay (FDA approved for NASAL specimens only), is one component of a comprehensive MRSA colonization surveillance program. It is not intended to diagnose MRSA infection nor to guide or monitor treatment for MRSA infections. Performed at [facility], [place] [address] [place], [place] [address]   Culture, blood (routine x 2) Call MD if unable to obtain prior to antibiotics being given     Status: None (Preliminary result)   Collection Time: 07/05/17 12:45 AM  Result Value Ref Range Status   Specimen Description BLOOD LEFT HAND  Final   Special Requests   Final    BOTTLES DRAWN AEROBIC ONLY Blood  Culture results may not be optimal due to an inadequate volume of blood received in culture bottles   Culture   Final    NO GROWTH 4 DAYS Performed at [facility] [address], [place], [place] [address]    Report Status PENDING  Incomplete  Culture, blood (routine x 2) Call MD if unable to obtain prior to antibiotics being given     Status: None (Preliminary result)   Collection Time: 07/05/17 12:50 AM  Result Value Ref Range Status   Specimen Description BLOOD LEFT HAND  Final   Special Requests   Final    BOTTLES DRAWN AEROBIC ONLY Blood Culture results may not be  optimal due to an inadequate volume of blood received in culture bottles   Culture   Final    NO GROWTH 4 DAYS Performed at [facility], [place] [address], [place], [place] [address]    Report Status PENDING  Incomplete      Radiology Studies: No results found.  Scheduled Meds: . acetaminophen  650 mg Oral Q6H  . allopurinol  300 mg Oral QHS  . atorvastatin  10 mg Oral Daily  . azithromycin  500 mg Oral Daily  . clopidogrel  75 mg Oral QODAY  . colchicine  0.6 mg Oral Daily  . collagenase   Topical Daily  . docusate sodium  100 mg Oral BID  . furosemide  60 mg Intravenous Q12H  . gabapentin  700 mg Oral Daily  . heparin  5,000 Units Subcutaneous Q8H  . insulin aspart  0-15 Units Subcutaneous TID WC  . insulin aspart  0-5 Units Subcutaneous QHS  . insulin aspart  3 Units Subcutaneous TID WC  . insulin glargine  7 Units Subcutaneous QHS  . lidocaine  1 patch Transdermal Q24H  . mouth rinse  15 mL Mouth Rinse BID  . mupirocin ointment  1 application Nasal BID  . polyethylene glycol  17 g Oral BID  . predniSONE  10 mg Oral Q breakfast  . prenatal vitamin w/FE, FA  1 tablet Oral Q1200   Continuous Infusions: . cefTRIAXone (ROCEPHIN)  IV Stopped (07/09/17 1224)  . methocarbamol (ROBAXIN)  IV       LOS: 5 days   Time spent: 35 minutes.  Patrecia Pour, MD [facility] [email or web address] Password [facility] 07/09/2017, 3:03 PM

## 2017-07-09 NOTE — Progress Notes (Signed)
Physical Therapy Treatment Patient Details Name: Marc [name] MRN: [id number] DOB: 1941/01/14 Today's Date: 07/09/2017    History of Present Illness Marc [name] is a 77 y.o. male with history of CAD, chronic kidney disease, diabetes mellitus, CHF, hypertension presents to the ER the patient has benign persistent left-sided chest pain after fall 5 days ago.  He has noticed increasing swelling in the lower extremity since fall due to not taking his lasix. Also patient developed decubitus ulcer in the sacral area.    PT Comments    Patient progressing slowly with therapy, able to ambulate this visit inside room. Limited by high levels of pain. Pt also with c/o of inability to urinate wit pain in lower abdomen. SNF recs still appropriate at this time. Min A level for OOB mobility.    Follow Up Recommendations  SNF;Supervision/Assistance - 24 hour     Equipment Recommendations  (TBD next venue)    Recommendations for Other Services       Precautions / Restrictions Precautions Precautions: Fall Restrictions Weight Bearing Restrictions: No    Mobility  Bed Mobility               General bed mobility comments: BSC at entry  Transfers Overall transfer level: Needs assistance Equipment used: Rolling walker (2 wheeled) Transfers: Stand Pivot Transfers;Sit to/from Stand Sit to Stand: Min guard Stand pivot transfers: Min guard       General transfer comment: Min guard for safety due to pain and unsteadiness  Ambulation/Gait Ambulation/Gait assistance: Min assist;Min guard Ambulation Distance (Feet): 20 Feet Assistive device: Rolling walker (2 wheeled) Gait Pattern/deviations: Step-to pattern Gait velocity: decreased   General Gait Details: pt able to ambulate in hospital room with RW, min A for steadying due to  pain    Stairs             Wheelchair Mobility    Modified Rankin (Stroke Patients Only)       Balance Overall balance assessment:  Needs assistance Sitting-balance support: Feet supported;No upper extremity supported Sitting balance-Leahy Scale: Fair     Standing balance support: No upper extremity supported Standing balance-Leahy Scale: Poor Standing balance comment: Reliant on UE support                            Cognition Arousal/Alertness: Awake/alert Behavior During Therapy: Flat affect                                          Exercises General Exercises - Lower Extremity Long Arc Quad: 10 reps    General Comments        Pertinent Vitals/Pain Pain Assessment: 0-10 Pain Score: 7  Pain Location: abdomen/constipation(L flank) Pain Descriptors / Indicators: Sharp;Cramping;Guarding;Grimacing;Moaning Pain Intervention(s): Limited activity within patient's tolerance;Monitored during session;Premedicated before session;Repositioned    Home Living                      Prior Function            PT Goals (current goals can now be found in the care plan section) Acute Rehab PT Goals Patient Stated Goal: stop the pain PT Goal Formulation: With patient Time For Goal Achievement: 07/20/17 Potential to Achieve Goals: Good Progress towards PT goals: Progressing toward goals    Frequency    Min 3X/week  PT Plan Current plan remains appropriate    Co-evaluation              AM-PAC PT "6 Clicks" Daily Activity  Outcome Measure  Difficulty turning over in bed (including adjusting bedclothes, sheets and blankets)?: Unable Difficulty moving from lying on back to sitting on the side of the bed? : Unable Difficulty sitting down on and standing up from a chair with arms (e.g., wheelchair, bedside commode, etc,.)?: Unable Help needed moving to and from a bed to chair (including a wheelchair)?: A Little Help needed walking in hospital room?: A Little Help needed climbing 3-5 steps with a railing? : Total 6 Click Score: 10    End of Session Equipment  Utilized During Treatment: Oxygen Activity Tolerance: Patient limited by pain Patient left: in chair;with call bell/phone within reach;with chair alarm set;with nursing/sitter in room Nurse Communication: Mobility status PT Visit Diagnosis: Unsteadiness on feet (R26.81);Pain Pain - Right/Left: Left Pain - part of body: (flank )     Time: 1141-1200 PT Time Calculation (min) (ACUTE ONLY): 19 min  Charges:  $Gait Training: 8-22 mins                    G Codes:       Reinaldo Berber, PT, DPT Acute Rehab Services Pager: [phone]     Reinaldo Berber 07/09/2017, 12:30 PM

## 2017-07-09 NOTE — Progress Notes (Signed)
Notified Dr. Bonner Puna Marc [name]'s HR is staying in the 150's. Marc [name] is resting in bed.

## 2017-07-09 NOTE — Consult Note (Addendum)
Cardiology Consultation  The patient has been seen in conjunction with Read Drivers, MD. All aspects of care have been considered and discussed. The patient has been personally interviewed, examined, and all clinical data has been reviewed.   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  Bradycardia has been asymptomatic, therefore, resume metoprolol at low dose and monitor for recurrent SVT and symptomatic bradycardia.  Ultimate therapy should PSVT or bradycardia persist would be SVT ablation or pacer plus medical therapy of SVT.  Will follow.     Patient ID: Marc [name], Marc [name] [id number], 1940-05-04 Admit date: 07/04/2017 Date of Consult: 07/09/2017  Primary Physician: Claris Gower Primary Cardiologist:  Referring Physician: Dr. Bonner Puna  Chief Complaint: Left-sided chest pain secondary to multiple rib fractures. Reason for Consultation: New onset tachyarrhythmia.  HPI: This is a 77 y.o. male with a past medical history significant for CAD, PVD, chronic HFpEF, stage III CKD, T2DM and HTN who presented to the ED with persistent left-sided chest pain after fall 5 days ago.  On imaging he was found to have several rib fractures. Patient was on Lasix 80 mg daily at home, stopped taking it for the 5 days before coming to the hospital as pain was interfering with his ability to use the bathroom frequently, resulted in worsening shortness of breath lower extremity edema, was undergone treatment for acute on chronic CHF.  Initial EKG on admission shows first-degree heart block and his low-dose metoprolol was held.  This morning patient develops tachycardia, cardiology was consulted.  Patient has been completely asymptomatic, denies any chest pain, shortness of breath, palpitations, orthopnea or PND.  He was complaining of multiple episodes of loose bowel movement since getting some laxative.  Denies any abdominal pain.  His rib fracture pain is improving. He  denies any past medical history of any irregular heart rhythm.  PMHx:  CARDIAC HISTORY:  Echo:  07/06/17:  Study Conclusions  - Left ventricle: The cavity size was normal. Systolic function was   normal. The estimated ejection fraction was in the range of 55%   to 60%. Wall motion was normal; there were no regional wall   motion abnormalities. Doppler parameters are consistent with   abnormal left ventricular relaxation (grade 1 diastolic   dysfunction). There was no evidence of elevated ventricular   filling pressure by Doppler parameters. - Aortic valve: There was no regurgitation. - Mitral valve: There was trivial regurgitation. - Left atrium: The atrium was mildly dilated. - Right ventricle: Systolic function was normal. - Right atrium: The atrium was normal in size. - Tricuspid valve: There was trivial regurgitation. - Pulmonary arteries: Systolic pressure was within the normal   range. - Inferior vena cava: The vessel was dilated. The respirophasic   diameter changes were blunted (< 50%), consistent with elevated   central venous pressure. - Pericardium, extracardiac: There was no pericardial effusion.  Past Medical History:  Diagnosis Date  . Anemia    low iron  . ARF (acute renal failure) ([place]) 12/2014  . Arthritis   . Bradycardia   . Chronic bronchitis ([place])   . CKD (chronic kidney disease), stage III ([place])   . Colon polyp   . Depression    occassional.  Situational  . GERD (gastroesophageal reflux disease)   . Gout   . Gout   . History of blood transfusion   . History of GI bleed 2007  . Hypercholesteremia   . Hypertension   . Neuropathy   .  PAD (peripheral artery disease) ([place])   . Pneumonia    denies  . Tobacco abuse disorder 03/10/2012  . Type II diabetes mellitus ([place])   . Ulcer disease    Past Surgical History:  Procedure Laterality Date  . AMPUTATION Right 02/07/2015   Procedure: GREAT TOE RAY AMPUTATION RIGHT ;  Surgeon: Angelia [name],  MD;  Location: [facility];  Service: Vascular;  Laterality: Right;  . AMPUTATION TOE Right 03/21/2015   Procedure: AMPUTATION TOE-RIGHT SECOND TOE;  Surgeon: Angelia [name], MD;  Location: [facility];  Service: Vascular;  Laterality: Right;  . ANTERIOR CERVICAL DECOMP/DISCECTOMY FUSION  ~ 2007  . ANTERIOR LAT LUMBAR FUSION Left 01/19/2014   Procedure: LATERAL INTERBODY FUSION 1 LEVEL;  Surgeon: Sinclair Ship, MD;  Location: [facility];  Service: Orthopedics;  Laterality: Left;  Left lumbar 3-4 lateral interbody fusion with instrumentation, allograft  . APPLICATION OF WOUND VAC Right 02/07/2015   Procedure:  APPLICATION OF WOUND VAC right great toe amputation site.;  Surgeon: Angelia [name], MD;  Location: [facility];  Service: Vascular;  Laterality: Right;  . APPLICATION OF WOUND VAC Right 03/21/2015   Procedure: APPLICATION OF WOUND VAC RIGHT FOOT;  Surgeon: Angelia [name], MD;  Location: [facility];  Service: Vascular;  Laterality: Right;  . BACK SURGERY    . BYPASS GRAFT FEMORAL-PERONEAL Right 02/07/2015   Procedure: BYPASS GRAFT FEMORAL-PERONEAL Trunk WITH VEIN graft  right leg.;  Surgeon: Angelia [name], MD;  Location: [facility];  Service: Vascular;  Laterality: Right;  . COLONOSCOPY    . ENDARTERECTOMY TIBIOPERONEAL Right 02/07/2015   Procedure: ENDARTERECTOMY TIBIOPERONEAL;  Surgeon: Angelia [name], MD;  Location: [facility];  Service: Vascular;  Laterality: Right;  . EXCISIONAL TOTAL KNEE ARTHROPLASTY WITH ANTIBIOTIC SPACERS Right 04/14/2015   Procedure: RIGHT KNEE [place] OUT AND PLACEMENT OF DRAINS;  Surgeon: Frederik Pear, MD;  Location: [facility];  Service: Orthopedics;  Laterality: Right;  . EYE SURGERY Bilateral    cataract surgery with lens implant  . HERNIA REPAIR  ~ 2007   UHR (02/04/2012)  . ILIAC ARTERY STENT Left    left common/notes (02/04/2012)  . INTRAOPERATIVE ARTERIOGRAM Right 02/07/2015   Procedure: INTRA OPERATIVE ARTERIOGRAM;  Surgeon: Angelia [name], MD;  Location:  [facility];  Service: Vascular;  Laterality: Right;  . IRRIGATION AND DEBRIDEMENT KNEE Right 04/14/2015   Procedure: IRRIGATION AND DEBRIDEMENT KNEE;  Surgeon: Frederik Pear, MD;  Location: [facility];  Service: Orthopedics;  Laterality: Right;  . LOWER EXTREMITY ANGIOGRAM N/A 02/04/2012   Procedure: LOWER EXTREMITY ANGIOGRAM;  Surgeon: Lorretta Harp, MD;  Location: [facility] C[facility] LAB;  Service: Cardiovascular;  Laterality: N/A;  . LUMBAR LAMINECTOMY  11/14  . PATCH ANGIOPLASTY Right 02/07/2015   Procedure: Vein PATCH ANGIOPLASTY to tibioperoneal trunk;  Surgeon: Angelia [name], MD;  Location: [facility];  Service: Vascular;  Laterality: Right;  . PERCUTANEOUS STENT INTERVENTION Left 02/04/2012   Procedure: PERCUTANEOUS STENT INTERVENTION;  Surgeon: Lorretta Harp, MD;  Location: [facility] CATH LAB;  Service: Cardiovascular;  Laterality: Left;  lt ext iliac stent  . PERIPHERAL VASCULAR CATHETERIZATION N/A 02/02/2015   Procedure: Abdominal Aortogram w/Lower Extremity;  Surgeon: Angelia [name], MD;  Location: [facility] CV LAB;  Service: Cardiovascular;  Laterality: N/A;  . POLYPECTOMY    . REPLACEMENT TOTAL KNEE Right   . SPINAL CORD STIMULATOR INSERTION N/A 09/20/2016   Procedure: LUMBAR SPINAL CORD STIMULATOR INSERTION;  Surgeon: Clydell Hakim, MD;  Location: [facility];  Service: Neurosurgery;  Laterality: N/A;  LUMBAR SPINAL CORD STIMULATOR INSERTION  . SPLENECTOMY  ~ 1957  . TONSILLECTOMY  ~ 1947  . TOTAL KNEE ARTHROPLASTY Right 02/03/2013   Procedure: TOTAL KNEE ARTHROPLASTY;  Surgeon: [name], MD;  Location: [place];  Service: Orthopedics;  Laterality: Right;  . UPPER ENDOSCOPY W/ SCLEROTHERAPY  ~ 2007  . WOUND DEBRIDEMENT Right 03/21/2015   Procedure: DEBRIDEMENT WOUND of RIGHT FOOT;  Surgeon: Angelia [name], MD;  Location: [facility] OR;  Service: Vascular;  Laterality: Right;    FAMHx: Family History  Problem Relation Age of Onset  . Colon cancer Paternal Uncle        Uncle  . Heart disease Father   .  Hypertension Father   . Heart attack Father   . Heart disease Mother   . Diabetes Mother   . Hypertension Mother     SOCHx:  reports that he has been smoking cigarettes.  He has a 58.00 pack-year smoking history. He has quit using smokeless tobacco. His smokeless tobacco use included chew. He reports that he does not drink alcohol or use drugs.  ALLERGIES: Allergies  Allergen Reactions  . Actos [Pioglitazone] Swelling and Other (See Comments)    LOWER EXTREMITY EDEMA      HOME MEDICATIONS: Medications Prior to Admission  Medication Sig Dispense Refill Last Dose  . allopurinol (ZYLOPRIM) 300 MG tablet Take 300 mg by mouth at bedtime.    Past Month at Unknown time  . atorvastatin (LIPITOR) 10 MG tablet Take 10 mg by mouth daily.   Past Month at Unknown time  . clopidogrel (PLAVIX) 75 MG tablet Take 75 mg by mouth every other day.    Past Month at Unknown time  . colchicine 0.6 MG tablet Take 0.6 mg by mouth daily.   Past Month at Unknown time  . docusate sodium (COLACE) 100 MG capsule Take 100 mg by mouth 2 (two) times daily.   Past Month at Unknown time  . furosemide (LASIX) 80 MG tablet Take 1 tablet (80 mg total) by mouth daily for 5 days. 5 tablet 0 Past Month at Unknown time  . gabapentin (NEURONTIN) 600 MG tablet Take 1,200 mg by mouth every 12 (twelve) hours.    Past Month at Unknown time  . glimepiride (AMARYL) 4 MG tablet Take 4 mg by mouth 2 (two) times daily at 10 AM and 5 PM. Taking Amaryl 4 mg 1-2 times per day. Patient states he takes Amaryl 4 mg QAM and if glucose is over 150 mg/dl later in the day he takes second dose of Amaryl 4 mg     . lisinopril (PRINIVIL,ZESTRIL) 20 MG tablet Take 1 tablet (20 mg total) by mouth daily. 30 tablet 1 Past Month at Unknown time  . oxyCODONE (ROXICODONE) 15 MG immediate release tablet Take 15 mg by mouth every 6 (six) hours as needed for pain.    unk at prn  . predniSONE (DELTASONE) 10 MG tablet Take 10 mg by mouth daily.    Past Month  at Unknown time  . Prenatal Vit-Fe Fumarate-FA (MULTIVITAMIN-PRENATAL) 27-0.8 MG TABS tablet Take 1 tablet by mouth daily at 12 noon.   Past Month at Unknown time  . sodium bicarbonate 650 MG tablet Take 2 tablets (1,300 mg total) by mouth 2 (two) times daily. 60 tablet 1 Past Month at Unknown time  . metoprolol tartrate (LOPRESSOR) 25 MG tablet Take 0.5 tablets (12.5 mg total) by mouth 2 (two) times daily. (Patient  not taking: Reported on 07/04/2017) 60 tablet 1 Not Taking at Unknown time  . senna-docusate (SENOKOT-S) 8.6-50 MG tablet Take 1 tablet by mouth at bedtime as needed for mild constipation. (Patient not taking: Reported on 07/04/2017) 30 tablet 1 Not Taking at Unknown time    HOSPITAL MEDICATIONS: . acetaminophen  650 mg Oral Q6H  . allopurinol  300 mg Oral QHS  . atorvastatin  10 mg Oral Daily  . clopidogrel  75 mg Oral QODAY  . colchicine  0.6 mg Oral Daily  . collagenase   Topical Daily  . docusate sodium  100 mg Oral BID  . furosemide  60 mg Intravenous Q12H  . gabapentin  700 mg Oral Daily  . heparin  5,000 Units Subcutaneous Q8H  . insulin aspart  0-15 Units Subcutaneous TID WC  . insulin aspart  0-5 Units Subcutaneous QHS  . insulin aspart  3 Units Subcutaneous TID WC  . insulin glargine  7 Units Subcutaneous QHS  . lidocaine  1 patch Transdermal Q24H  . mouth rinse  15 mL Mouth Rinse BID  . metoprolol tartrate  25 mg Oral BID  . mupirocin ointment  1 application Nasal BID  . polyethylene glycol  17 g Oral BID  . predniSONE  10 mg Oral Q breakfast  . prenatal vitamin w/FE, FA  1 tablet Oral Q1200    ROS General: Negative; No fevers, chills, or night sweats;  HEENT: Negative; No changes in vision or hearing, sinus congestion, difficulty swallowing Pulmonary: Negative; No cough, wheezing, shortness of breath, hemoptysis Cardiovascular: Negative; No chest pain, presyncope, syncope, palpitations GI: Negative; No nausea, vomiting, diarrhea, or abdominal pain GU:  Negative; No dysuria, hematuria, or difficulty voiding Musculoskeletal: Negative; no myalgias, joint pain, or weakness Hematologic/Oncology: Negative; no easy bruising, bleeding Endocrine: Negative; no heat/cold intolerance; no diabetes Neuro: Negative; no changes in balance, headaches Skin: Negative; No rashes or skin lesions Psychiatric: Negative; No behavioral problems, depression Sleep: Negative; No snoring, daytime sleepiness, hypersomnolence, bruxism, restless legs, hypnogognic hallucinations, no cataplexy Other comprehensive 14 point system review is negative.  VITALS: Blood pressure 128/67, pulse (!) 151, temperature 98 F (36.7 C), temperature source Oral, resp. rate 18, height _0  (1.854 m), weight 154 lb 3.2 oz (69.9 kg), SpO2 98 %.  PHYSICAL EXAM: Vitals:   07/09/17 0334 07/09/17 1257 07/09/17 1406 07/09/17 1412  BP:  130/85 128/67   Pulse:  (!) 101 (!) 124 (!) 151  Resp:  18    Temp:  98 F (36.7 C)    TempSrc:  Oral    SpO2:  98%    Weight: 154 lb 3.2 oz (69.9 kg)     Height: _1  (1.854 m)      General: Vital signs reviewed.  Patient is well-developed and well-nourished, in no acute distress and cooperative with exam.  Head: Normocephalic and atraumatic. Eyes: EOMI, conjunctivae normal, no scleral icterus.  Neck: Supple, trachea midline, normal ROM, no JVD, masses, thyromegaly, or carotid bruit present.  Cardiovascular: RRR, with some missed beats, no murmurs, gallops, or rubs. Pulmonary/Chest: Clear to auscultation bilaterally, no wheezes, rales, or rhonchi. Chest wall.  Mild left lateral chest wall tenderness. Abdominal: Soft, non-tender, non-distended, BS +, no masses, organomegaly, or guarding present.  Extremities: No lower extremity edema bilaterally,  pulses symmetric and intact bilaterally. No cyanosis or clubbing. Neurological: A&O x3, Strength is normal and symmetric bilaterally, cranial nerve II-XII are grossly intact, no focal motor deficit, sensory  intact to light touch bilaterally.  Skin: Warm, dry and intact. No rashes or erythema. Psychiatric: Normal mood and affect. speech and behavior is normal. Cognition and memory are normal.  ECG (independently read by me): Sinus rhythm with frequent PACs, may be SVT during initial EKG done this morning.  LABS: Results for orders placed or performed during the hospital encounter of 07/04/17 (from the past 48 hour(s))  Glucose, capillary     Status: Abnormal   Collection Time: 07/07/17  9:20 PM  Result Value Ref Range   Glucose-Capillary 190 (H) 65 - 99 mg/dL   Comment 1 Notify RN    Comment 2 Document in Chart   CBC     Status: Abnormal   Collection Time: 07/08/17  6:41 AM  Result Value Ref Range   WBC 7.3 4.0 - 10.5 K/uL   RBC 3.68 (L) 4.22 - 5.81 MIL/uL   Hemoglobin 11.9 (L) 13.0 - 17.0 g/dL   HCT 36.1 (L) 39.0 - 52.0 %   MCV 98.1 78.0 - 100.0 fL   MCH 32.3 26.0 - 34.0 pg   MCHC 33.0 30.0 - 36.0 g/dL   RDW 13.0 11.5 - 15.5 %   Platelets 348 150 - 400 K/uL    Comment: Performed at [facility], [place] [address] [place], [place] [address]  Comprehensive metabolic panel     Status: Abnormal   Collection Time: 07/08/17  6:41 AM  Result Value Ref Range   Sodium 142 135 - 145 mmol/L   Potassium 3.9 3.5 - 5.1 mmol/L   Chloride 96 (L) 101 - 111 mmol/L   CO2 33 (H) 22 - 32 mmol/L   Glucose, Bld 228 (H) 65 - 99 mg/dL   BUN 57 (H) 6 - 20 mg/dL   Creatinine, Ser 1.84 (H) 0.61 - 1.24 mg/dL   Calcium 9.2 8.9 - 10.3 mg/dL   Total Protein 6.3 (L) 6.5 - 8.1 g/dL   Albumin 2.8 (L) 3.5 - 5.0 g/dL   AST 21 15 - 41 U/L   ALT 9 (L) 17 - 63 U/L   Alkaline Phosphatase 128 (H) 38 - 126 U/L   Total Bilirubin 0.4 0.3 - 1.2 mg/dL   GFR calc non Af Amer 34 (L) >60 mL/min   GFR calc Af Amer 39 (L) >60 mL/min    Comment: (NOTE) The eGFR has been calculated using the CKD EPI equation. This calculation has not been validated in all clinical situations. eGFR's persistently <60 mL/min signify  possible Chronic Kidney Disease.    Anion gap 13 5 - 15    Comment: Performed at [facility] [address] [place], [place] [address]  Magnesium     Status: None   Collection Time: 07/08/17  6:41 AM  Result Value Ref Range   Magnesium 2.0 1.7 - 2.4 mg/dL    Comment: Performed at [facility] [address] [place], [place] [address]  Glucose, capillary     Status: Abnormal   Collection Time: 07/08/17  7:40 AM  Result Value Ref Range   Glucose-Capillary 228 (H) 65 - 99 mg/dL  Glucose, capillary     Status: Abnormal   Collection Time: 07/08/17 11:40 AM  Result Value Ref Range   Glucose-Capillary 168 (H) 65 - 99 mg/dL  Glucose, capillary     Status: None   Collection Time: 07/08/17  3:57 PM  Result Value Ref Range   Glucose-Capillary 91 65 - 99 mg/dL  Glucose, capillary     Status: Abnormal  Collection Time: 07/08/17  9:07 PM  Result Value Ref Range   Glucose-Capillary 323 (H) 65 - 99 mg/dL  Urinalysis, Routine w reflex microscopic     Status: Abnormal   Collection Time: 07/08/17 11:40 PM  Result Value Ref Range   Color, Urine YELLOW YELLOW   APPearance CLEAR CLEAR   Specific Gravity, Urine 1.011 1.005 - 1.030   pH 6.0 5.0 - 8.0   Glucose, UA NEGATIVE NEGATIVE mg/dL   Hgb urine dipstick LARGE (A) NEGATIVE   Bilirubin Urine NEGATIVE NEGATIVE   Ketones, ur NEGATIVE NEGATIVE mg/dL   Protein, ur NEGATIVE NEGATIVE mg/dL   Nitrite NEGATIVE NEGATIVE   Leukocytes, UA NEGATIVE NEGATIVE   RBC / HPF 21-50 0 - 5 RBC/hpf   WBC, UA 0-5 0 - 5 WBC/hpf   Bacteria, UA RARE (A) NONE SEEN   Hyaline Casts, UA PRESENT     Comment: Performed at [facility], [address], [place], [place] [address]  CBC     Status: Abnormal   Collection Time: 07/09/17  6:41 AM  Result Value Ref Range   WBC 10.3 4.0 - 10.5 K/uL   RBC 3.64 (L) 4.22 - 5.81 MIL/uL   Hemoglobin 11.7 (L) 13.0 - 17.0 g/dL   HCT 35.8 (L) 39.0 - 52.0 %   MCV 98.4 78.0 - 100.0 fL   MCH 32.1 26.0 - 34.0 pg     MCHC 32.7 30.0 - 36.0 g/dL   RDW 13.2 11.5 - 15.5 %   Platelets 341 150 - 400 K/uL    Comment: Performed at [facility], [place] [address], [place], [place] [address]  Basic metabolic panel     Status: Abnormal   Collection Time: 07/09/17  6:41 AM  Result Value Ref Range   Sodium 142 135 - 145 mmol/L   Potassium 3.5 3.5 - 5.1 mmol/L   Chloride 96 (L) 101 - 111 mmol/L   CO2 33 (H) 22 - 32 mmol/L   Glucose, Bld 108 (H) 65 - 99 mg/dL   BUN 57 (H) 6 - 20 mg/dL   Creatinine, Ser 1.77 (H) 0.61 - 1.24 mg/dL   Calcium 9.4 8.9 - 10.3 mg/dL   GFR calc non Af Amer 35 (L) >60 mL/min   GFR calc Af Amer 41 (L) >60 mL/min    Comment: (NOTE) The eGFR has been calculated using the CKD EPI equation. This calculation has not been validated in all clinical situations. eGFR's persistently <60 mL/min signify possible Chronic Kidney Disease.    Anion gap 13 5 - 15    Comment: Performed at [facility] [address], [place], [place] [address]  Magnesium     Status: None   Collection Time: 07/09/17  6:41 AM  Result Value Ref Range   Magnesium 2.0 1.7 - 2.4 mg/dL    Comment: Performed at [facility] [address] [place], [place] [address]  Glucose, capillary     Status: Abnormal   Collection Time: 07/09/17  7:25 AM  Result Value Ref Range   Glucose-Capillary 124 (H) 65 - 99 mg/dL   Comment 1 Notify RN    Comment 2 Document in Chart     IMAGING: No results found.  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  Echo done on 03/08/2017 was within normal limit.  Patient completely remained asymptomatic, electrolytes are within normal limit.  RECOMMENDATION:  -  Repeat EKG-as current monitor shows sinus rhythm with some PACs, heart rate in 60s. -Continue home dose of metoprolol, which has already been started by primary team.  Can give an extra dose of metoprolol if  needed. -Can increase the dose of metoprolol to 25 mg twice daily if needed. -If patient redevelop tachyarrhythmia despite being on metoprolol, might need an ablation. -Continue telemetry.  Lorella Nimrod MD PGY2 07/09/2017 4:37 PM

## 2017-07-09 NOTE — Plan of Care (Signed)
  Problem: Activity: Goal: Risk for activity intolerance will decrease Outcome: Progressing   Problem: Activity: Goal: Capacity to carry out activities will improve Outcome: Progressing   

## 2017-07-09 NOTE — Progress Notes (Signed)
Patient was complaining of burning with urination. MD was paged and a order for a UA was placed. Will continue to monitor.  Drue Flirt, RN

## 2017-07-09 NOTE — Progress Notes (Signed)
[facility] was asked to consult on this patient. He was previously followed by Dr. Wynonia Lawman. Dr Bonner Puna, attending for IM, spoke with Dr. Wynonia Lawman who informed him he no longer follows this patient and recommended to reassign to the unassigned pool, therefore, our group was called. Resident will be seeing consult with Dr. Tamala Julian. Dayna Dunn PA-C

## 2017-07-10 DIAGNOSIS — I495 Sick sinus syndrome: Secondary | ICD-10-CM

## 2017-07-10 LAB — URINE CULTURE: Culture: 20000 — AB

## 2017-07-10 LAB — BASIC METABOLIC PANEL
Anion gap: 16 — ABNORMAL HIGH (ref 5–15)
BUN: 57 mg/dL — ABNORMAL HIGH (ref 6–20)
CHLORIDE: 93 mmol/L — AB (ref 101–111)
CO2: 31 mmol/L (ref 22–32)
Calcium: 9.3 mg/dL (ref 8.9–10.3)
Creatinine, Ser: 1.96 mg/dL — ABNORMAL HIGH (ref 0.61–1.24)
GFR calc non Af Amer: 31 mL/min — ABNORMAL LOW (ref >60)
GFR, EST AFRICAN AMERICAN: 36 mL/min — AB (ref >60)
Glucose, Bld: 148 mg/dL — ABNORMAL HIGH (ref 65–99)
POTASSIUM: 3.8 mmol/L (ref 3.5–5.1)
Sodium: 140 mmol/L (ref 135–145)

## 2017-07-10 LAB — CULTURE, BLOOD (ROUTINE X 2)
Culture: NO GROWTH
Culture: NO GROWTH

## 2017-07-10 LAB — GLUCOSE, CAPILLARY
GLUCOSE-CAPILLARY: 152 mg/dL — AB (ref 65–99)
Glucose-Capillary: 140 mg/dL — ABNORMAL HIGH (ref 65–99)
Glucose-Capillary: 155 mg/dL — ABNORMAL HIGH (ref 65–99)
Glucose-Capillary: 266 mg/dL — ABNORMAL HIGH (ref 65–99)
Glucose-Capillary: 176 mg/dL — ABNORMAL HIGH (ref 65–99)

## 2017-07-10 MED ORDER — FUROSEMIDE 80 MG PO TABS
80.0000 mg | ORAL_TABLET | Freq: Every day | ORAL | Status: DC
  Administered 2017-07-11: 80 mg via ORAL
  Filled 2017-07-10: qty 1

## 2017-07-10 MED ORDER — FLUCONAZOLE 100 MG PO TABS
100.0000 mg | ORAL_TABLET | Freq: Once | ORAL | Status: AC
  Administered 2017-07-10: 100 mg via ORAL
  Filled 2017-07-10: qty 1

## 2017-07-10 MED ORDER — METOPROLOL TARTRATE 12.5 MG HALF TABLET
12.5000 mg | ORAL_TABLET | Freq: Two times a day (BID) | ORAL | Status: DC
  Administered 2017-07-10 (×2): 12.5 mg via ORAL
  Filled 2017-07-10 (×3): qty 1

## 2017-07-10 MED ORDER — OXYCODONE HCL 5 MG PO TABS
10.0000 mg | ORAL_TABLET | ORAL | Status: DC | PRN
  Administered 2017-07-10 – 2017-07-11 (×3): 10 mg via ORAL
  Filled 2017-07-10 (×3): qty 2

## 2017-07-10 NOTE — Progress Notes (Signed)
PROGRESS NOTE  ELAINE MIDDLETON  ZSW:[id number] DOB: 07-16-1940 DOA: 07/04/2017 PCP: [name], MD   Brief Narrative: Quang Thorpe Herndonis a 77 y.o.malewithhistory of CAD, PVD, chronic HFpEF, stage III CKD, T2DM and HTN who presented to the ED with persistent left-sided chest pain after fall 5 days ago. He stopped lasix due to inability to get to the bathroom due to pain and noted increased leg swelling and an ulcer in the sacral area during this time. He was noted to be hypoxic. Evaluation showed several rib fractures and has undergone treatment for acute on chronic CHF and possible CAP. Initial ECG demonstrated 1st deg AVB and low dose metoprolol was held. On 6/12, the patient developed SVT, so cardiology is consulted and metoprolol is restarted.  Assessment & Plan: Principal Problem:   Closed fracture of rib of left side Active Problems:   Peripheral vascular disease with claudication (HCC)   Chronic kidney disease stage 3   CAD (coronary artery disease), native coronary artery   Ascending aortic aneurysm (HCC)   Type 2 diabetes mellitus with renal complication (HCC)   Acute on chronic diastolic CHF (congestive heart failure) (HCC)   Acute CHF (congestive heart failure) (HCC)   Pressure injury of skin   Tachycardia-bradycardia syndrome ([place])  Acute hypoxic respiratory failure: Due to acute CHF, pneumonia, and decreased expansion due to splinting/atelectasis.  - Resolved.   Acute on chronic diastolic CHF, CAD, PVD: Due to stopping lasix at home. Echo showed EF 55-60%, G1DD. U/S checked for LE edema, negative. Tn negative. - Strict I/O, daily weight. Wt down ~20lbs, slight bump in creatinine so will back off on lasix to 80mg  po daily (stated usual home dose) and follow creatinine. - Continue plavix, statin  Tachy-brady, paroxysmal SVT: Metoprolol initially held due to sinus bradycardia/1st deg AVB. Restarted at low dose 6/12 with no further recurrence. Having sinus  bradycardia as low as the 40's without significant (3 second) pauses or symptoms.  - Continue low dose metoprolol per cardiology recommendations. Plan to continue BB as long as no pauses and no symptoms of bradycardia.  Acute left 5th-12th rib fractures: Due to mechanical fall. Case discussed with trauma surgery who recommended no changes to management. - Continue pain control for rib fx's; will increase oxycodone to 10mg  dosing (takes 15mg -30mg  at home) and incentive spirometry/OOB.  - Continue PT/OT, dispo planned to SNF once stable  RUL CAP: Urine Ag's negative.  - Continued ceftriaxone 6/7 - 6/13. Completed course of azithromycin (6/7-6/12) - Monitor sputum culture and blood cultures (NGTD).   Dysuria: UA does not appear grossly infected, though he's receiving CTX regardless. Hemoglobinuria noted. No stones, hydro or focal lesion on non-con CT abd/pelvis on 6/7. No external abnormality on exam. No remotely recent sexual activity and no penile discharge. - Treat yeast on urine culture w/diflucan x1, may repeat if remains symptomatic.  - Consider NGU work up vs. empiric azithromycin 1g x1 if continues to have symptoms.  - Unable to add pyridium with CrCl <39ml/min - Recommend outpatient urology follow up for hematuria.   T2DM:  - Started insulin, and is now at inpatient goal on lantus 7u daily + SSI. This may need to be continued at discharge. ?Hyperglycemia worsened by low dose prednisone.   AKI on stage III CKD: SCr 2.95 on admission, now back to baseline 1.7- 1.8.   - Monitor in AM as backing off on diuresis. - Renally dose medications (decreased gabapentin) - Stopped bicarb due to metabolic alkalosis (possibly due  to contraction)  Anemia of chronic renal disease: Stable. Monitor. No evidence of bleeding.   Stage III sacral decubitus ulcer:  - Offload as able, WOC consulted  HTN:  - Held lisinopril w/CKD while diuresing, restarted metoprolol.  Ascending aortic aneurysm: -  Follow up with Dr. Servando Snare as outpatient  DVT prophylaxis: Heparin Code Status: DNR Family Communication: None at bedside Disposition Plan: SNF likely 6/14.  Consultants:   Cardiology  Procedures:  [place] LE Final Interpretation: Right: There is no evidence of deep vein thrombosis in the lower extremity.There is no evidence of superficial venous thrombosis. Left: There is no evidence of deep vein thrombosis in the lower extremity.There is no evidence of superficial venous thrombosis.  Echo Study Conclusions  - Left ventricle: The cavity size was normal. Systolic function was normal. The estimated ejection fraction was in the range of 55% to 60%. Wall motion was normal; there were no regional wall motion abnormalities. Doppler parameters are consistent with abnormal left ventricular relaxation (grade 1 diastolic dysfunction). There was no evidence of elevated ventricular filling pressure by Doppler parameters. - Aortic valve: There was no regurgitation. - Mitral valve: There was trivial regurgitation. - Left atrium: The atrium was mildly dilated. - Right ventricle: Systolic function was normal. - Right atrium: The atrium was normal in size. - Tricuspid valve: There was trivial regurgitation. - Pulmonary arteries: Systolic pressure was within the normal range. - Inferior vena cava: The vessel was dilated. The respirophasic diameter changes were blunted (<50%), consistent with elevated central venous pressure. - Pericardium, extracardiac: There was no pericardial effusion.   Antimicrobials:  Ceftriaxone, azithromycin   Subjective: Severe burning in penis during urination. No abd/auprapubic localization of pain, no gross hematuria or discharge. Given diflucan this AM and on recheck this PM states he feels this is better and will take lasix at directed. Rib pain is stable, states he takes more oxycodone at home than he's getting here even PTA. No dyspnea or  anterior chest pain today.  Objective: Vitals:   07/10/17 0519 07/10/17 0752 07/10/17 1018 07/10/17 1305  BP: 126/82 131/70  129/62  Pulse: 65 (!) 43 72 (!) 42  Resp: 18 20  20   Temp: 97.7 F (36.5 C) 98.5 F (36.9 C)  98.2 F (36.8 C)  TempSrc: Oral Oral  Axillary  SpO2: 99% 100%  94%  Weight: 69.3 kg (152 lb 11.2 oz)     Height:        Intake/Output Summary (Last 24 hours) at 07/10/2017 1436 Last data filed at 07/10/2017 0942 Gross per 24 hour  Intake 540 ml  Output 1600 ml  Net -1060 ml   Filed Weights   07/08/17 0450 07/09/17 0334 07/10/17 0519  Weight: 70.9 kg (156 lb 6.4 oz) 69.9 kg (154 lb 3.2 oz) 69.3 kg (152 lb 11.2 oz)    Gen: 77 y.o. male in no distress, laying on right side in bed.  Pulm: Non-labored breathing, decreased breath sounds on left but clear CV: Regular bradycardia this AM. No murmur, rub, or gallop. No JVD, trace LE edema. GI: Abdomen soft, non-tender, non-distended, with normoactive bowel sounds. No organomegaly or masses felt. GU: Normal external male genitalia without ulceration or focal tenderness to palpation. Ext: Warm, no deformities Skin: Diffuse UE ecchymoses, stable. Sacral decubitus ulcer dressing soiled. Neuro: Alert and oriented. No focal neurological deficits. Psych: Judgement and insight appear normal. Mood & affect appropriate.   Data Reviewed: I have personally reviewed following labs and imaging studies  CBC: Recent Labs  Lab 07/04/17 1428 07/05/17 0702 07/06/17 0802 07/07/17 0515 07/08/17 0641 07/09/17 0641  WBC 9.3 7.3 5.7 6.1 7.3 10.3  NEUTROABS 7.9*  --   --   --   --   --   HGB 11.9* 12.2* 10.5* 11.1* 11.9* 11.7*  HCT 35.9* 37.4* 32.5* 33.9* 36.1* 35.8*  MCV 97.3 97.9 98.5 99.4 98.1 98.4  PLT 343 301 300 305 348 630   Basic Metabolic Panel: Recent Labs  Lab 07/05/17 0702 07/06/17 0802 07/07/17 0515 07/08/17 0641 07/09/17 0641 07/10/17 0646  NA 138 137 141 142 142 140  K 3.9 3.6 3.7 3.9 3.5 3.8  CL 97*  97* 98* 96* 96* 93*  CO2 26 30 33* 33* 33* 31  GLUCOSE 241* 244* 128* 228* 108* 148*  BUN 66* 63* 65* 57* 57* 57*  CREATININE 2.22* 2.11* 1.97* 1.84* 1.77* 1.96*  CALCIUM 9.2 8.7* 9.0 9.2 9.4 9.3  MG 2.3 2.1 2.2 2.0 2.0  --    GFR: Estimated Creatinine Clearance: 30.9 mL/min (A) (by C-G formula based on SCr of 1.96 mg/dL (H)). Liver Function Tests: Recent Labs  Lab 07/04/17 1428 07/05/17 0702 07/06/17 0802 07/08/17 0641  AST 18 20 16 21   ALT 8* 6* 8* 9*  ALKPHOS 152* 138* 112 128*  BILITOT 0.8 0.5 0.6 0.4  PROT 7.4 6.8 5.9* 6.3*  ALBUMIN 3.1* 2.8* 2.6* 2.8*   No results for input(s): LIPASE, AMYLASE in the last 168 hours. Recent Labs  Lab 07/04/17 1428  AMMONIA 26   Coagulation Profile: Recent Labs  Lab 07/04/17 1428  INR 1.21   Cardiac Enzymes: Recent Labs  Lab 07/04/17 1428 07/05/17 0702  CKTOTAL 106  --   TROPONINI <0.03 <0.03   BNP (last 3 results) No results for input(s): PROBNP in the last 8760 hours. HbA1C: No results for input(s): HGBA1C in the last 72 hours. CBG: Recent Labs  Lab 07/09/17 1247 07/09/17 1654 07/10/17 0104 07/10/17 0724 07/10/17 1303  GLUCAP 238* 241* 152* 140* 155*   Lipid Profile: No results for input(s): CHOL, HDL, LDLCALC, TRIG, CHOLHDL, LDLDIRECT in the last 72 hours. Thyroid Function Tests: No results for input(s): TSH, T4TOTAL, FREET4, T3FREE, THYROIDAB in the last 72 hours. Anemia Panel: No results for input(s): VITAMINB12, FOLATE, FERRITIN, TIBC, IRON, RETICCTPCT in the last 72 hours. Urine analysis:    Component Value Date/Time   COLORURINE YELLOW 07/08/2017 2340   APPEARANCEUR CLEAR 07/08/2017 2340   LABSPEC 1.011 07/08/2017 2340   PHURINE 6.0 07/08/2017 2340   GLUCOSEU NEGATIVE 07/08/2017 2340   HGBUR LARGE (A) 07/08/2017 2340   BILIRUBINUR NEGATIVE 07/08/2017 2340   KETONESUR NEGATIVE 07/08/2017 2340   PROTEINUR NEGATIVE 07/08/2017 2340   UROBILINOGEN 1.0 01/12/2014 1004   NITRITE NEGATIVE 07/08/2017  2340   LEUKOCYTESUR NEGATIVE 07/08/2017 2340   Recent Results (from the past 240 hour(s))  MRSA PCR Screening     Status: None   Collection Time: 07/04/17 11:14 PM  Result Value Ref Range Status   MRSA by PCR NEGATIVE NEGATIVE Final    Comment:        The GeneXpert MRSA Assay (FDA approved for NASAL specimens only), is one component of a comprehensive MRSA colonization surveillance program. It is not intended to diagnose MRSA infection nor to guide or monitor treatment for MRSA infections. Performed at [facility], [place] [address], [place], [place] [address]   Culture, blood (routine x 2) Call MD if unable to obtain prior to antibiotics being given  Status: None   Collection Time: 07/05/17 12:45 AM  Result Value Ref Range Status   Specimen Description BLOOD LEFT HAND  Final   Special Requests   Final    BOTTLES DRAWN AEROBIC ONLY Blood Culture results may not be optimal due to an inadequate volume of blood received in culture bottles   Culture   Final    NO GROWTH 5 DAYS Performed at [facility], [place] [address] [place], [place] [address]    Report Status 07/10/2017 FINAL  Final  Culture, blood (routine x 2) Call MD if unable to obtain prior to antibiotics being given     Status: None   Collection Time: 07/05/17 12:50 AM  Result Value Ref Range Status   Specimen Description BLOOD LEFT HAND  Final   Special Requests   Final    BOTTLES DRAWN AEROBIC ONLY Blood Culture results may not be optimal due to an inadequate volume of blood received in culture bottles   Culture   Final    NO GROWTH 5 DAYS Performed at [facility], [place] [address] [place], [place] [address]    Report Status 07/10/2017 FINAL  Final  Culture, Urine     Status: Abnormal   Collection Time: 07/08/17 11:40 PM  Result Value Ref Range Status   Specimen Description URINE, CLEAN CATCH  Final   Special Requests   Final    NONE Performed at [facility], [place] [address]  [place], [place] [address]    Culture 20,000 COLONIES/mL YEAST (A)  Final   Report Status 07/10/2017 FINAL  Final      Radiology Studies: No results found.  Scheduled Meds: . acetaminophen  650 mg Oral Q6H  . allopurinol  300 mg Oral QHS  . atorvastatin  10 mg Oral Daily  . clopidogrel  75 mg Oral QODAY  . colchicine  0.6 mg Oral Daily  . collagenase   Topical Daily  . docusate sodium  100 mg Oral BID  . furosemide  60 mg Intravenous Q12H  . gabapentin  700 mg Oral Daily  . heparin  5,000 Units Subcutaneous Q8H  . insulin aspart  0-15 Units Subcutaneous TID WC  . insulin aspart  0-5 Units Subcutaneous QHS  . insulin aspart  3 Units Subcutaneous TID WC  . insulin glargine  7 Units Subcutaneous QHS  . lidocaine  1 patch Transdermal Q24H  . mouth rinse  15 mL Mouth Rinse BID  . metoprolol tartrate  12.5 mg Oral BID  . mupirocin ointment  1 application Nasal BID  . polyethylene glycol  17 g Oral BID  . predniSONE  10 mg Oral Q breakfast  . prenatal vitamin w/FE, FA  1 tablet Oral Q1200   Continuous Infusions: . methocarbamol (ROBAXIN)  IV       LOS: 6 days   Time spent: 25 minutes.  Patrecia Pour, MD [facility] [email or web address] Password [facility] 07/10/2017, 2:36 PM

## 2017-07-10 NOTE — Progress Notes (Signed)
Pt. C/o pain and burning with voiding. On call for [facility] paged to make aware.

## 2017-07-10 NOTE — Progress Notes (Signed)
Progress Note  Patient Name: Marc [name] Date of Encounter: 07/10/2017  Primary Cardiologist: No primary care provider on file.   Subjective   Patient remained asymptomatic when seen this morning.  According to patient he is heart rate always stays in 40s to 50s.  Denies any chest pain, palpitations or shortness of breath. Complaining of mild dysuria.  Inpatient Medications    Scheduled Meds: . acetaminophen  650 mg Oral Q6H  . allopurinol  300 mg Oral QHS  . atorvastatin  10 mg Oral Daily  . clopidogrel  75 mg Oral QODAY  . colchicine  0.6 mg Oral Daily  . collagenase   Topical Daily  . docusate sodium  100 mg Oral BID  . furosemide  60 mg Intravenous Q12H  . gabapentin  700 mg Oral Daily  . heparin  5,000 Units Subcutaneous Q8H  . insulin aspart  0-15 Units Subcutaneous TID WC  . insulin aspart  0-5 Units Subcutaneous QHS  . insulin aspart  3 Units Subcutaneous TID WC  . insulin glargine  7 Units Subcutaneous QHS  . lidocaine  1 patch Transdermal Q24H  . mouth rinse  15 mL Mouth Rinse BID  . metoprolol tartrate  25 mg Oral BID  . mupirocin ointment  1 application Nasal BID  . polyethylene glycol  17 g Oral BID  . predniSONE  10 mg Oral Q breakfast  . prenatal vitamin w/FE, FA  1 tablet Oral Q1200   Continuous Infusions: . cefTRIAXone (ROCEPHIN)  IV Stopped (07/09/17 1224)  . methocarbamol (ROBAXIN)  IV     PRN Meds: bisacodyl, fentaNYL (SUBLIMAZE) injection, methocarbamol (ROBAXIN)  IV, ondansetron **OR** ondansetron (ZOFRAN) IV, oxyCODONE   Vital Signs    Vitals:   07/09/17 1412 07/09/17 2005 07/10/17 0519 07/10/17 0752  BP:  (!) 103/59 126/82 131/70  Pulse: (!) 151 (!) 50 65 (!) 43  Resp:  20 18 20   Temp:  98.4 F (36.9 C) 97.7 F (36.5 C) 98.5 F (36.9 C)  TempSrc:  Oral Oral Oral  SpO2:  100% 99% 100%  Weight:   152 lb 11.2 oz (69.3 kg)   Height:        Intake/Output Summary (Last 24 hours) at 07/10/2017 0918 Last data filed at 07/10/2017  0734 Gross per 24 hour  Intake 300 ml  Output 1603 ml  Net -1303 ml   Filed Weights   07/08/17 0450 07/09/17 0334 07/10/17 0519  Weight: 156 lb 6.4 oz (70.9 kg) 154 lb 3.2 oz (69.9 kg) 152 lb 11.2 oz (69.3 kg)    Telemetry     - Personally Reviewed.  Remained in sinus rhythm with heart rate in 40s to 50s.  ECG     - Personally Reviewed.  Sinus rhythm with heart rate of 58, nonspecific T wave changes.  Physical Exam   GEN: No acute distress.   Neck: No JVD Cardiac: RRR, no murmurs, rubs, or gallops.  Respiratory: Clear to auscultation bilaterally. GI: Soft, nontender, non-distended  MS: No edema; No deformity. Neuro:  Nonfocal  Psych: Normal affect   Labs    Chemistry Recent Labs  Lab 07/05/17 0702 07/06/17 0802  07/08/17 0641 07/09/17 0641 07/10/17 0646  NA 138 137   < > 142 142 140  K 3.9 3.6   < > 3.9 3.5 3.8  CL 97* 97*   < > 96* 96* 93*  CO2 26 30   < > 33* 33* 31  GLUCOSE 241* 244*   < >  228* 108* 148*  BUN 66* 63*   < > 57* 57* 57*  CREATININE 2.22* 2.11*   < > 1.84* 1.77* 1.96*  CALCIUM 9.2 8.7*   < > 9.2 9.4 9.3  PROT 6.8 5.9*  --  6.3*  --   --   ALBUMIN 2.8* 2.6*  --  2.8*  --   --   AST 20 16  --  21  --   --   ALT 6* 8*  --  9*  --   --   ALKPHOS 138* 112  --  128*  --   --   BILITOT 0.5 0.6  --  0.4  --   --   GFRNONAA 27* 29*   < > 34* 35* 31*  GFRAA 31* 33*   < > 39* 41* 36*  ANIONGAP 15 10   < > 13 13 16*   < > = values in this interval not displayed.     Hematology Recent Labs  Lab 07/07/17 0515 07/08/17 0641 07/09/17 0641  WBC 6.1 7.3 10.3  RBC 3.41* 3.68* 3.64*  HGB 11.1* 11.9* 11.7*  HCT 33.9* 36.1* 35.8*  MCV 99.4 98.1 98.4  MCH 32.6 32.3 32.1  MCHC 32.7 33.0 32.7  RDW 13.2 13.0 13.2  PLT 305 348 341    Cardiac Enzymes Recent Labs  Lab 07/04/17 1428 07/05/17 0702  TROPONINI <0.03 <0.03    Recent Labs  Lab 07/04/17 1438  TROPIPOC 0.02     BNP Recent Labs  Lab 07/04/17 1428  BNP 38.2     DDimer No  results for input(s): DDIMER in the last 168 hours.   Radiology    No results found.  Cardiac Studies   Echo:  07/06/17:  Study Conclusions  - Left ventricle: The cavity size was normal. Systolic function was normal. The estimated ejection fraction was in the range of 55% to 60%. Wall motion was normal; there were no regional wall motion abnormalities. Doppler parameters are consistent with abnormal left ventricular relaxation (grade 1 diastolic dysfunction). There was no evidence of elevated ventricular filling pressure by Doppler parameters. - Aortic valve: There was no regurgitation. - Mitral valve: There was trivial regurgitation. - Left atrium: The atrium was mildly dilated. - Right ventricle: Systolic function was normal. - Right atrium: The atrium was normal in size. - Tricuspid valve: There was trivial regurgitation. - Pulmonary arteries: Systolic pressure was within the normal range. - Inferior vena cava: The vessel was dilated. The respirophasic diameter changes were blunted (<50%), consistent with elevated central venous pressure. - Pericardium, extracardiac: There was no pericardial effusion.   Patient Profile     77 y.o. male with pmhx of CAD, PVD, HFpEF, CKD III, type II DM, and HTN who presented to the ED on 6/7 with left sided chest pain after a fall 5 days ago. Imaging was consistent with several rib fractures. Patient is on 80 mg lasix daily at home, but stopped taking this 5 days ago due to pain limiting his ability to use the bathroom. On admission he had worsening SOB and LE edema. EKG showed first degree heart block and his metoprolol was held. Subsequently developed tachycardia and cardiology was consulted.   Assessment & Plan    Acute on Chronic Diastolic Heart Failure Exacerbation: Due to stopping home lasix 80 mg qd x 5 days prior to [place]. Has been diuresing with IV lasix 60 mg BID.  Appears euvolemic now.  Tachy / Loletha Grayer:  Bradycardic on admission  with HR 50 and 1st degree AV block. Metoprolol was held. Developed tachycardia yesterday, EKG with sinus rhythm & frequent PACs, possible atrial tachycardia. Review of telemetry shows sinus rhythm with heart rate in 40s and 50s.  No tachyarrhythmia since 5 PM yesterday. -Tachyarrhythmias resolved after resuming home dose of metoprolol. -Continue home dose of metoprolol 12.5 mg twice daily. -Is okay to have an heart rate in 40s if patient remained symptomatic. -Will need an outpatient follow-up with cardiology, for further management if continued to have paroxysmal SVT.  For questions or updates, please contact [place] Please consult [email or web address] for contact info under Cardiology/STEMI.      Rollene Rotunda  MD PGY2 07/10/2017, 9:18 AM

## 2017-07-10 NOTE — Progress Notes (Signed)
Pt. Able to reposition self in bed.

## 2017-07-10 NOTE — Clinical Social Work Note (Signed)
Patient has insurance approval to discharge to [facility] when stable. Likely tomorrow.  Marc [name], [place]

## 2017-07-11 LAB — BASIC METABOLIC PANEL
ANION GAP: 11 (ref 5–15)
BUN: 54 mg/dL — AB (ref 6–20)
CALCIUM: 9.2 mg/dL (ref 8.9–10.3)
CO2: 33 mmol/L — ABNORMAL HIGH (ref 22–32)
Chloride: 96 mmol/L — ABNORMAL LOW (ref 101–111)
Creatinine, Ser: 1.95 mg/dL — ABNORMAL HIGH (ref 0.61–1.24)
GFR calc Af Amer: 36 mL/min — ABNORMAL LOW (ref >60)
GFR calc non Af Amer: 31 mL/min — ABNORMAL LOW (ref >60)
GLUCOSE: 111 mg/dL — AB (ref 65–99)
POTASSIUM: 3.3 mmol/L — AB (ref 3.5–5.1)
SODIUM: 140 mmol/L (ref 135–145)

## 2017-07-11 LAB — GLUCOSE, CAPILLARY
GLUCOSE-CAPILLARY: 122 mg/dL — AB (ref 65–99)
Glucose-Capillary: 137 mg/dL — ABNORMAL HIGH (ref 65–99)

## 2017-07-11 MED ORDER — OXYCODONE HCL 10 MG PO TABS: 10.0000 mg | ORAL_TABLET | Freq: Four times a day (QID) | ORAL | 0 refills | Status: DC | PRN

## 2017-07-11 MED ORDER — INSULIN GLARGINE 100 UNIT/ML ~~LOC~~ SOLN: 7.0000 [IU] | Freq: Every day | SUBCUTANEOUS | Status: AC

## 2017-07-11 MED ORDER — FUROSEMIDE 80 MG PO TABS: 80.0000 mg | ORAL_TABLET | Freq: Every day | ORAL | 0 refills | Status: AC

## 2017-07-11 MED ORDER — GABAPENTIN 600 MG PO TABS: 600.0000 mg | ORAL_TABLET | Freq: Two times a day (BID) | ORAL | Status: AC

## 2017-07-11 NOTE — Discharge Summary (Signed)
Physician Discharge Summary  Marc [name] CZY:[id number] DOB: January 04, 1941 DOA: 07/04/2017  PCP: [name], MD  Admit date: 07/04/2017 Discharge date: 07/11/2017  Admitted From: Home Disposition: SNF   Recommendations for Outpatient Follow-up:  1. Follow up with PCP in 1-2 weeks 2. Please obtain BMP/CBC in one week 3. Follow up with cardiology (either Dr. Gwenlyn Found or Dr. Wynonia Lawman)   Home Health: N/A Equipment/Devices: None new Discharge Condition: Stable CODE STATUS: DNR Diet recommendation: Heart healthy, carb-modified  Brief/Interim Summary: Marc B Herndonis a 77 y.o.malewithhistory of CAD, PVD, chronic HFpEF, stage III CKD, T2DM and HTN who presented to the ED with persistent left-sided chest pain after fall 5 days ago. He stopped lasix due to inability to get to the bathroom due to pain and noted increased leg swelling and an ulcer in the sacral area during this time. He was noted to be hypoxic. Evaluation showed several rib fractures and has undergone treatment for acute on chronic CHF and possible CAP. Initial ECG demonstrated 1st deg AVB and low dose metoprolol was held. On 6/12, the patient developed SVT, so cardiology was consulted and metoprolol was restarted with resultant asymptomatic sinus bradycardia. The patient was successfully diuresed and completed a course of antibiotics. He was evaluated by PT and OT who have recommended skilled rehabilitation prior to return home.   Discharge Diagnoses:  Principal Problem:   Closed fracture of rib of left side Active Problems:   Peripheral vascular disease with claudication (HCC)   Chronic kidney disease stage 3   CAD (coronary artery disease), native coronary artery   Ascending aortic aneurysm (HCC)   Type 2 diabetes mellitus with renal complication (HCC)   Acute on chronic diastolic CHF (congestive heart failure) (HCC)   Acute CHF (congestive heart failure) (HCC)   Pressure injury of skin   Tachycardia-bradycardia  syndrome ([place])  Acute hypoxic respiratory failure: Due to acute CHF, pneumonia, and decreased expansion due to splinting/atelectasis.  - Resolved.   Acute on chronic diastolic CHF, CAD, PVD: Due to stopping lasix at home. Echo showed EF 55-60%, G1DD. U/S checked for LE edema, negative. Tn negative. - Recommend continuing lasix 80mg  po. Wt down ~20lbs to 150lbs with slight bump in creatinine which is stable at 1.95 and needs to be monitored in the next week.  - Continue plavix, statin  Tachy-brady, paroxysmal SVT: Metoprolol initially held due to sinus bradycardia/1st deg AVB. Restarted at low dose 6/12 with no further recurrence. Having sinus bradycardia as low as the 40's without significant (3 second) pauses or symptoms.  - Continue low dose metoprolol per cardiology recommendations. Plan to continue BB as long as no pauses and no symptoms of bradycardia. Can follow up with cardiology if ablation is felt to be necessary.  Acute left 5th-12th rib fractures: Due to mechanical fall. Case discussed with trauma surgery who recommended no changes to management. - Continue pain control for rib fx's; stable on oxycodone 10mg  dosing (took 15mg -30mg  at home) and incentive spirometry. - Continue PT/OT, dispo planned to SNF  RUL CAP: Urine Ag's negative.  - Continued ceftriaxone 6/7 - 6/13. Completed course of azithromycin (6/7-6/12) - Sputum culture and blood cultures negative.   Dysuria: UA does not appear grossly infected, though he's receiving CTX regardless. Hemoglobinuria noted. No stones, hydro or focal lesion on non-con CT abd/pelvis on 6/7. No external abnormality on exam. No remotely recent sexual activity and no penile discharge. - Treat yeast on urine culture w/diflucan. may repeat if remains symptomatic.  - Consider  NGU work up vs. empiric azithromycin 1g x1 if continues to have symptoms.  - Unable to add pyridium with CrCl <73ml/min - Recommend outpatient urology follow up for  hematuria if this continues.   T2DM: HbA1c 8.9% - Started insulin, and is now at inpatient goal on lantus 7u daily + SSI. Given elevated A1c and need for steroids, will continue long acting insulin at discharge and restart glimepiride.   AKI on stage III CKD: SCr 2.95 on admission, has returned to baseline 1.7- 1.9.   - Renally dose medications (decreased gabapentin) - Stopped bicarb due to metabolic alkalosis.   Anemia of chronic renal disease: Stable. Monitor. No evidence of bleeding.   Stage III sacral decubitus ulcer:  - Offload as able, WOC consulted  HTN:  - Held lisinopril w/CKD while diuresing, restarted metoprolol and BP is at goal. Consider restarting lisinopril if BP rises.  Ascending aortic aneurysm: - Follow up with Dr. Servando Snare as outpatient  Discharge Instructions Discharge Instructions    Diet - low sodium heart healthy   Complete by:  As directed    Diet Carb Modified   Complete by:  As directed    Increase activity slowly   Complete by:  As directed      Allergies as of 07/11/2017      Reactions   Actos [pioglitazone] Swelling, Other (See Comments)   LOWER EXTREMITY EDEMA      Medication List    STOP taking these medications   lisinopril 20 MG tablet Commonly known as:  PRINIVIL,ZESTRIL   sodium bicarbonate 650 MG tablet     TAKE these medications   allopurinol 300 MG tablet Commonly known as:  ZYLOPRIM Take 300 mg by mouth at bedtime.   atorvastatin 10 MG tablet Commonly known as:  LIPITOR Take 10 mg by mouth daily.   clopidogrel 75 MG tablet Commonly known as:  PLAVIX Take 75 mg by mouth every other day.   colchicine 0.6 MG tablet Take 0.6 mg by mouth daily.   docusate sodium 100 MG capsule Commonly known as:  COLACE Take 100 mg by mouth 2 (two) times daily.   furosemide 80 MG tablet Commonly known as:  LASIX Take 1 tablet (80 mg total) by mouth daily.   gabapentin 600 MG tablet Commonly known as:  NEURONTIN Take 1 tablet  (600 mg total) by mouth every 12 (twelve) hours. What changed:  how much to take   glimepiride 4 MG tablet Commonly known as:  AMARYL Take 4 mg by mouth 2 (two) times daily at 10 AM and 5 PM. Taking Amaryl 4 mg 1-2 times per day. Patient states he takes Amaryl 4 mg QAM and if glucose is over 150 mg/dl later in the day he takes second dose of Amaryl 4 mg   insulin glargine 100 UNIT/ML injection Commonly known as:  LANTUS Inject 0.07 mLs (7 Units total) into the skin at bedtime.   metoprolol tartrate 25 MG tablet Commonly known as:  LOPRESSOR Take 0.5 tablets (12.5 mg total) by mouth 2 (two) times daily.   multivitamin-prenatal 27-0.8 MG Tabs tablet Take 1 tablet by mouth daily at 12 noon.   Oxycodone HCl 10 MG Tabs Take 1 tablet (10 mg total) by mouth every 6 (six) hours as needed for pain. What changed:    medication strength  how much to take   predniSONE 10 MG tablet Commonly known as:  DELTASONE Take 10 mg by mouth daily.   senna-docusate 8.6-50 MG tablet Commonly known  as:  Senokot-S Take 1 tablet by mouth at bedtime as needed for mild constipation.       Contact information for follow-up providers    [name], MD Follow up.   Specialty:  Family Medicine Contact information: [place] [address] [phone]            Contact information for after-discharge care    Destination    [facility] .   Service:  Skilled Nursing Contact information: [place] [phone]                 Allergies  Allergen Reactions  . Actos [Pioglitazone] Swelling and Other (See Comments)    LOWER EXTREMITY EDEMA     Consultations:  Cardiology  Procedures/Studies: Ct Abdomen Pelvis Wo Contrast  Result Date: 07/04/2017 CLINICAL DATA:  Back pain after fall 5 days ago. EXAM: CT CHEST, ABDOMEN AND PELVIS WITHOUT CONTRAST TECHNIQUE: Multidetector CT imaging of  the chest, abdomen and pelvis was performed following the standard protocol without IV contrast. COMPARISON:  CT chest dated April 23, 2017. CT lumbar myelogram dated May 30, 2016. PET-CT dated July 19, 2014. FINDINGS: CT CHEST FINDINGS Cardiovascular: Normal heart size. No pericardial effusion. 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 Coronary, aortic arch, and branch vessel atherosclerotic vascular disease. Mediastinum/Nodes: No enlarged mediastinal, hilar, or axillary lymph nodes. Thyroid gland, trachea, and esophagus demonstrate no significant findings. Lungs/Pleura: There are a few patchy ground-glass densities in the subpleural posterior right upper lobe. Right lower lobe atelectasis. Unchanged round atelectasis in the left lower lobe. No pleural effusion or pneumothorax. Saber sheath trachea, mild centrilobular and paraseptal emphysema, diffuse bronchial wall thickening are again noted. Stable scattered calcified pleural plaques bilaterally. Musculoskeletal: Acute, mildly displaced fractures of the left lateral fifth through eleventh ribs. Nondisplaced fracture of the posterior left eleventh and twelfth ribs. Unchanged spinal stimulator terminating in the posterior midthoracic spinal canal. CT ABDOMEN PELVIS FINDINGS Hepatobiliary: No focal liver abnormality is seen. No gallstones, gallbladder wall thickening, or biliary dilatation. Pancreas: Mild atrophy. No ductal dilatation or surrounding inflammatory changes. Spleen: Normal in size without focal abnormality. Adrenals/Urinary Tract: Adrenal glands are unremarkable. Kidneys are normal, without renal calculi, focal lesion, or hydronephrosis. Bladder is unremarkable. Stomach/Bowel: Stomach is within normal limits. Appendix appears normal. No evidence of bowel wall thickening, distention, or inflammatory changes. Moderate stool ball in the rectum. Vascular/Lymphatic: Aortic atherosclerosis. Partially visualized right  femoral bypass graft. No enlarged abdominal or pelvic lymph nodes. Reproductive: Prostatomegaly with central gland hypertrophy indenting the bladder base. Other: No abdominal wall hernia or abnormality. No abdominopelvic ascites. No pneumoperitoneum. Musculoskeletal: No acute or significant osseous findings. Prior left lateral L3-L4 fusion and L4-L5 posterior fusion. Severe degenerative disc disease at L2-L3 and L5-S1, unchanged. IMPRESSION: Chest: 1. Acute fractures of the left fifth through twelfth ribs. 2. A few new patchy ground-glass densities in the posterior right upper lobe are likely infectious or inflammatory in etiology. 3.  Emphysema (ICD10-J43.9). 4. 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 Recommend semi-annual imaging followup by CTA or MRA and referral to cardiothoracic surgery if not already obtained. This recommendation follows 2010 ACCF/AHA/AATS/ACR/ASA/SCA/SCAI/SIR/STS/SVM Guidelines for the Diagnosis and Management of Patients With Thoracic Aortic Disease. Circulation. 2010; 121: e266-e369 5.  Aortic atherosclerosis (ICD10-I70.0). Abdomen and pelvis: 1. No evidence of acute traumatic injury within the abdomen or pelvis. 2. Prior L3-L5 fusion.  No acute fracture.  Electronically Signed   By: Titus Dubin M.D.   On: 07/04/2017 17:07   Dg Chest 2 View  Result Date: 07/04/2017 CLINICAL DATA:  Back pain following a fall 5 days ago. EXAM: CHEST - 2 VIEW COMPARISON:  03/18/2015 and chest CT dated 04/23/2017. FINDINGS: The cardiac silhouette remains borderline enlarged with a tortuous and calcified thoracic aorta. Neural stimulator lead tips at the level of the midthoracic spine. Clear lungs with stable mild diffuse peribronchial thickening and accentuation of the interstitial markings. The lungs remain mildly hyperexpanded. Thoracic spine degenerative changes. Displaced lateral left 4th, 5th, 6th, 7th, 8th and 9th rib fractures. No pneumothorax. IMPRESSION:  1. Displaced left 4th through 9th rib fractures without pneumothorax. 2. 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 Electronically Signed   By: Claudie Revering M.D.   On: 07/04/2017 14:20   Dg Lumbar Spine Complete  Result Date: 07/04/2017 CLINICAL DATA:  Back pain EXAM: LUMBAR SPINE - COMPLETE 4+ VIEW COMPARISON:  Lumbar spine CT 05/30/2016 FINDINGS: L3-4 and L4-5 fusion hardware in stable position. Severe disc degeneration at L2-3 with endplate irregularity and retrolisthesis superimposed on disc collapse. These findings are stable from comparison myelography. Degenerative changes are widespread. Dorsal column stimulator in unremarkable position. No demonstrable arthrodesis at L3-4. Prominent osteopenia.  Atherosclerosis with stents. IMPRESSION: No acute finding or change from 2018 myelogram. Advanced degenerative disease. Electronically Signed   By: Monte Fantasia M.D.   On: 07/04/2017 14:24   Ct Chest Wo Contrast  Result Date: 07/04/2017 CLINICAL DATA:  Back pain after fall 5 days ago. EXAM: CT CHEST, ABDOMEN AND PELVIS WITHOUT CONTRAST TECHNIQUE: Multidetector CT imaging of the chest, abdomen and pelvis was performed following the standard protocol without IV contrast. COMPARISON:  CT chest dated April 23, 2017. CT lumbar myelogram dated May 30, 2016. PET-CT dated July 19, 2014. FINDINGS: CT CHEST FINDINGS Cardiovascular: Normal heart size. No pericardial effusion. 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 Coronary, aortic arch, and branch vessel atherosclerotic vascular disease. Mediastinum/Nodes: No enlarged mediastinal, hilar, or axillary lymph nodes. Thyroid gland, trachea, and esophagus demonstrate no significant findings. Lungs/Pleura: There are a few patchy ground-glass densities in the subpleural posterior right upper lobe. Right lower lobe atelectasis. Unchanged round atelectasis in the left lower lobe. No pleural effusion or pneumothorax. Saber sheath  trachea, mild centrilobular and paraseptal emphysema, diffuse bronchial wall thickening are again noted. Stable scattered calcified pleural plaques bilaterally. Musculoskeletal: Acute, mildly displaced fractures of the left lateral fifth through eleventh ribs. Nondisplaced fracture of the posterior left eleventh and twelfth ribs. Unchanged spinal stimulator terminating in the posterior midthoracic spinal canal. CT ABDOMEN PELVIS FINDINGS Hepatobiliary: No focal liver abnormality is seen. No gallstones, gallbladder wall thickening, or biliary dilatation. Pancreas: Mild atrophy. No ductal dilatation or surrounding inflammatory changes. Spleen: Normal in size without focal abnormality. Adrenals/Urinary Tract: Adrenal glands are unremarkable. Kidneys are normal, without renal calculi, focal lesion, or hydronephrosis. Bladder is unremarkable. Stomach/Bowel: Stomach is within normal limits. Appendix appears normal. No evidence of bowel wall thickening, distention, or inflammatory changes. Moderate stool ball in the rectum. Vascular/Lymphatic: Aortic atherosclerosis. Partially visualized right femoral bypass graft. No enlarged abdominal or pelvic lymph nodes. Reproductive: Prostatomegaly with central gland hypertrophy indenting the bladder base. Other: No abdominal wall hernia or abnormality. No abdominopelvic ascites. No pneumoperitoneum. Musculoskeletal: No acute or significant osseous findings. Prior left lateral L3-L4 fusion and L4-L5 posterior fusion. Severe degenerative disc disease at L2-L3 and L5-S1, unchanged. IMPRESSION: Chest: 1. Acute  fractures of the left fifth through twelfth ribs. 2. A few new patchy ground-glass densities in the posterior right upper lobe are likely infectious or inflammatory in etiology. 3.  Emphysema (ICD10-J43.9). 4. 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 Recommend semi-annual imaging followup by CTA or MRA and referral to cardiothoracic surgery  if not already obtained. This recommendation follows 2010 ACCF/AHA/AATS/ACR/ASA/SCA/SCAI/SIR/STS/SVM Guidelines for the Diagnosis and Management of Patients With Thoracic Aortic Disease. Circulation. 2010; 121: e266-e369 5.  Aortic atherosclerosis (ICD10-I70.0). Abdomen and pelvis: 1. No evidence of acute traumatic injury within the abdomen or pelvis. 2. Prior L3-L5 fusion.  No acute fracture. Electronically Signed   By: Titus Dubin M.D.   On: 07/04/2017 17:07   Dg Chest Port 1 View  Result Date: 07/05/2017 CLINICAL DATA:  Hypoxia EXAM: PORTABLE CHEST 1 VIEW COMPARISON:  07/04/2017 FINDINGS: Similar minor basilar atelectasis. No new collapse, consolidation, edema pattern, developing effusion, or pneumothorax. Several acute displaced left rib fractures again noted. No developing subcutaneous emphysema or chest wall soft tissue asymmetry. Exam is rotated to the right. Trachea is midline. Aorta is tortuous. Thoracic stimulator noted. IMPRESSION: Several acute left lateral displaced rib fractures Persistent bibasilar atelectasis No developing effusion, airspace process or pneumothorax. Electronically Signed   By: Jerilynn Mages.  Shick M.D.   On: 07/05/2017 10:25   [place] LE Final Interpretation: Right: There is no evidence of deep vein thrombosis in the lower extremity.There is no evidence of superficial venous thrombosis. Left: There is no evidence of deep vein thrombosis in the lower extremity.There is no evidence of superficial venous thrombosis.  Echo Study Conclusions  - Left ventricle: The cavity size was normal. Systolic function was normal. The estimated ejection fraction was in the range of 55% to 60%. Wall motion was normal; there were no regional wall motion abnormalities. Doppler parameters are consistent with abnormal left ventricular relaxation (grade 1 diastolic dysfunction). There was no evidence of elevated ventricular filling pressure by Doppler parameters. - Aortic valve: There  was no regurgitation. - Mitral valve: There was trivial regurgitation. - Left atrium: The atrium was mildly dilated. - Right ventricle: Systolic function was normal. - Right atrium: The atrium was normal in size. - Tricuspid valve: There was trivial regurgitation. - Pulmonary arteries: Systolic pressure was within the normal range. - Inferior vena cava: The vessel was dilated. The respirophasic diameter changes were blunted (<50%), consistent with elevated central venous pressure. - Pericardium, extracardiac: There was no pericardial effusion.  Subjective: Dysuria improved especially since taking diflucan and with standing up to urinate. No gross hematuria. Rib pain is stable, controlled on reduced dose oxycodone.  Discharge Exam: Vitals:   07/11/17 0928 07/11/17 1146  BP: 131/67 128/85  Pulse: (!) 55 75  Resp:    Temp:  (!) 97.3 F (36.3 C)  SpO2:  93%   General: Pt is alert, awake, not in acute distress Cardiovascular: Regular bradycardia, S1/S2 +, no rubs, no gallops. No edema. Respiratory: CTA bilaterally, no wheezing or crackles. Abdominal: Soft, NT, ND, bowel sounds + Skin: Diffuse UE ecchymoses, stable. Sacral decubitus ulcer dressing soiled.  Labs: BNP (last 3 results) Recent Labs    07/04/17 1428  BNP 15.1   Basic Metabolic Panel: Recent Labs  Lab 07/05/17 0702 07/06/17 0802 07/07/17 0515 07/08/17 0641 07/09/17 0641 07/10/17 0646 07/11/17 0538  NA 138 137 141 142 142 140 140  K 3.9 3.6 3.7 3.9 3.5 3.8 3.3*  CL 97* 97* 98* 96* 96* 93* 96*  CO2 26  30 33* 33* 33* 31 33*  GLUCOSE 241* 244* 128* 228* 108* 148* 111*  BUN 66* 63* 65* 57* 57* 57* 54*  CREATININE 2.22* 2.11* 1.97* 1.84* 1.77* 1.96* 1.95*  CALCIUM 9.2 8.7* 9.0 9.2 9.4 9.3 9.2  MG 2.3 2.1 2.2 2.0 2.0  --   --    Liver Function Tests: Recent Labs  Lab 07/04/17 1428 07/05/17 0702 07/06/17 0802 07/08/17 0641  AST 18 20 16 21   ALT 8* 6* 8* 9*  ALKPHOS 152* 138* 112 128*  BILITOT  0.8 0.5 0.6 0.4  PROT 7.4 6.8 5.9* 6.3*  ALBUMIN 3.1* 2.8* 2.6* 2.8*   No results for input(s): LIPASE, AMYLASE in the last 168 hours. Recent Labs  Lab 07/04/17 1428  AMMONIA 26   CBC: Recent Labs  Lab 07/04/17 1428 07/05/17 0702 07/06/17 0802 07/07/17 0515 07/08/17 0641 07/09/17 0641  WBC 9.3 7.3 5.7 6.1 7.3 10.3  NEUTROABS 7.9*  --   --   --   --   --   HGB 11.9* 12.2* 10.5* 11.1* 11.9* 11.7*  HCT 35.9* 37.4* 32.5* 33.9* 36.1* 35.8*  MCV 97.3 97.9 98.5 99.4 98.1 98.4  PLT 343 301 300 305 348 341   Cardiac Enzymes: Recent Labs  Lab 07/04/17 1428 07/05/17 0702  CKTOTAL 106  --   TROPONINI <0.03 <0.03   BNP: Invalid input(s): POCBNP CBG: Recent Labs  Lab 07/10/17 1303 07/10/17 1602 07/10/17 2131 07/11/17 0827 07/11/17 1138  GLUCAP 155* 266* 176* 122* 137*   D-Dimer No results for input(s): DDIMER in the last 72 hours. Hgb A1c No results for input(s): HGBA1C in the last 72 hours. Lipid Profile No results for input(s): CHOL, HDL, LDLCALC, TRIG, CHOLHDL, LDLDIRECT in the last 72 hours. Thyroid function studies No results for input(s): TSH, T4TOTAL, T3FREE, THYROIDAB in the last 72 hours.  Invalid input(s): FREET3 Anemia work up No results for input(s): VITAMINB12, FOLATE, FERRITIN, TIBC, IRON, RETICCTPCT in the last 72 hours. Urinalysis    Component Value Date/Time   COLORURINE YELLOW 07/08/2017 2340   APPEARANCEUR CLEAR 07/08/2017 2340   LABSPEC 1.011 07/08/2017 2340   PHURINE 6.0 07/08/2017 2340   GLUCOSEU NEGATIVE 07/08/2017 2340   HGBUR LARGE (A) 07/08/2017 2340   BILIRUBINUR NEGATIVE 07/08/2017 2340   KETONESUR NEGATIVE 07/08/2017 2340   PROTEINUR NEGATIVE 07/08/2017 2340   UROBILINOGEN 1.0 01/12/2014 1004   NITRITE NEGATIVE 07/08/2017 2340   [place] NEGATIVE 07/08/2017 2340    Microbiology Recent Results (from the past 240 hour(s))  MRSA PCR Screening     Status: None   Collection Time: 07/04/17 11:14 PM  Result Value Ref Range  Status   MRSA by PCR NEGATIVE NEGATIVE Final    Comment:        The GeneXpert MRSA Assay (FDA approved for NASAL specimens only), is one component of a comprehensive MRSA colonization surveillance program. It is not intended to diagnose MRSA infection nor to guide or monitor treatment for MRSA infections. Performed at [facility], [place] [address] [place], [place] [address]   Culture, blood (routine x 2) Call MD if unable to obtain prior to antibiotics being given     Status: None   Collection Time: 07/05/17 12:45 AM  Result Value Ref Range Status   Specimen Description BLOOD LEFT HAND  Final   Special Requests   Final    BOTTLES DRAWN AEROBIC ONLY Blood Culture results may not be optimal due to an inadequate volume of blood received in culture bottles  Culture   Final    NO GROWTH 5 DAYS Performed at [facility], [place] [address] [place], [place] [address]    Report Status 07/10/2017 FINAL  Final  Culture, blood (routine x 2) Call MD if unable to obtain prior to antibiotics being given     Status: None   Collection Time: 07/05/17 12:50 AM  Result Value Ref Range Status   Specimen Description BLOOD LEFT HAND  Final   Special Requests   Final    BOTTLES DRAWN AEROBIC ONLY Blood Culture results may not be optimal due to an inadequate volume of blood received in culture bottles   Culture   Final    NO GROWTH 5 DAYS Performed at [facility], [place] [address] [place], [place] [address]    Report Status 07/10/2017 FINAL  Final  Culture, Urine     Status: Abnormal   Collection Time: 07/08/17 11:40 PM  Result Value Ref Range Status   Specimen Description URINE, CLEAN CATCH  Final   Special Requests   Final    NONE Performed at [facility], [place] [address] [place],  [address]    Culture 20,000 COLONIES/mL YEAST (A)  Final   Report Status 07/10/2017 FINAL  Final    Time coordinating discharge: Approximately 40 minutes  Patrecia Pour, MD  [facility] 07/11/2017, 12:34 PM Pager [phone]

## 2017-07-11 NOTE — Progress Notes (Signed)
Called report to [facility], report given to [employer].Awaiting for transport. Patient alert and oriented , no complaints of any discomfort eating lunch at this time.

## 2017-07-11 NOTE — Clinical Social Work Placement (Signed)
   CLINICAL SOCIAL WORK PLACEMENT  NOTE  Date:  07/11/2017  Patient Details  Name: Marc [name] MRN: [id number] Date of Birth: [birth year]  Clinical Social Work is seeking post-discharge placement for this patient at the [place] level of care (*CSW will initial, date and re-position this form in  chart as items are completed):  Yes   Patient/family provided with [facility] Work Department's list of facilities offering this level of care within the geographic area requested by the patient (or if unable, by the patient's family).  Yes   Patient/family informed of their freedom to choose among providers that offer the needed level of care, that participate in Medicare, Medicaid or managed care program needed by the patient, have an available bed and are willing to accept the patient.  Yes   Patient/family informed of [facility]'s ownership interest in [facility] and [facility], as well as of the fact that they are under no obligation to receive care at these facilities.  PASRR submitted to EDS on 07/07/17     PASRR number received on       Existing PASRR number confirmed on 07/07/17     FL2 transmitted to all facilities in geographic area requested by pt/family on 07/07/17     FL2 transmitted to all facilities within larger geographic area on       Patient informed that his/her managed care company has contracts with or will negotiate with certain facilities, including the following:        Yes   Patient/family informed of bed offers received.  Patient chooses bed at [place], [place]     Physician recommends and patient chooses bed at      Patient to be transferred to [place], [place] on 07/11/17.  Patient to be transferred to facility by [facility]     Patient family notified on 07/11/17 of transfer.  Name of family member notified:  Patient stated he would call family.     PHYSICIAN Please prepare prescriptions      Additional Comment:    _______________________________________________ Candie Chroman, LCSW 07/11/2017, 12:42 PM

## 2017-07-11 NOTE — Clinical Social Work Note (Signed)
CSW facilitated patient discharge including contacting facility to confirm patient discharge plans. Patient stated he would call family. Clinical information faxed to facility and family agreeable with plan. CSW arranged ambulance transport via [facility] to [facility]. RN to call report prior to discharge [phone] Room 107).  CSW will sign off for now as social work intervention is no longer needed. Please consult [place] again if new needs arise.  Dayton Scrape, [place]

## 2017-07-11 NOTE — Care Management Important Message (Signed)
Important Message  Patient Details  Name: Marc [name] MRN: [id number] Date of Birth: [birth year]   Medicare Important Message Given:  Yes    [name] [place] 07/11/2017, 2:47 PM

## 2017-07-11 NOTE — Progress Notes (Addendum)
The patient has been seen in conjunction with Latina Craver, MD. All aspects of care have been considered and discussed. The patient has been personally interviewed, examined, and all clinical data has been reviewed.   Agree, the patient has the tachycardia-bradycardia syndrome.  Continue low-dose beta-blocker therapy.  Heart rates in the mid 40s are not actionable.  Should symptoms develop on beta-blocker therapy related to bradycardia or pauses will need to consider SVT ablation versus pacemaker therapy.  Needs outpatient cardiology follow-up with Dr. Gwenlyn Found or Dr. Wynonia Lawman  Progress Note  Patient Name: Marc [name] Date of Encounter: 07/11/2017  Primary Cardiologist: Quay Burow / Tollie Eth  Subjective   Patient remained asymptomatic when seen this morning.  Denies any chest pain, palpitations or shortness of breath. He was more frustrated with difficulty urination and dysuria.  Inpatient Medications    Scheduled Meds: . acetaminophen  650 mg Oral Q6H  . allopurinol  300 mg Oral QHS  . atorvastatin  10 mg Oral Daily  . clopidogrel  75 mg Oral QODAY  . colchicine  0.6 mg Oral Daily  . collagenase   Topical Daily  . docusate sodium  100 mg Oral BID  . furosemide  80 mg Oral Daily  . gabapentin  700 mg Oral Daily  . heparin  5,000 Units Subcutaneous Q8H  . insulin aspart  0-15 Units Subcutaneous TID WC  . insulin aspart  0-5 Units Subcutaneous QHS  . insulin aspart  3 Units Subcutaneous TID WC  . insulin glargine  7 Units Subcutaneous QHS  . lidocaine  1 patch Transdermal Q24H  . mouth rinse  15 mL Mouth Rinse BID  . metoprolol tartrate  12.5 mg Oral BID  . mupirocin ointment  1 application Nasal BID  . polyethylene glycol  17 g Oral BID  . predniSONE  10 mg Oral Q breakfast  . prenatal vitamin w/FE, FA  1 tablet Oral Q1200   Continuous Infusions: . methocarbamol (ROBAXIN)  IV     PRN Meds: bisacodyl, fentaNYL (SUBLIMAZE) injection, methocarbamol (ROBAXIN)  IV,  ondansetron **OR** ondansetron (ZOFRAN) IV, oxyCODONE   Vital Signs    Vitals:   07/10/17 2119 07/11/17 0200 07/11/17 0545 07/11/17 0928  BP:  126/65 (!) 145/59 131/67  Pulse: 72 (!) 49 (!) 45 (!) 55  Resp:   20   Temp:   98.1 F (36.7 C)   TempSrc:      SpO2:   97%   Weight:   150 lb (68 kg)   Height:        Intake/Output Summary (Last 24 hours) at 07/11/2017 1100 Last data filed at 07/11/2017 1043 Gross per 24 hour  Intake 664 ml  Output 1175 ml  Net -511 ml   Filed Weights   07/09/17 0334 07/10/17 0519 07/11/17 0545  Weight: 154 lb 3.2 oz (69.9 kg) 152 lb 11.2 oz (69.3 kg) 150 lb (68 kg)    Telemetry     - Personally Reviewed.  Remained in sinus rhythm with heart rate in 40s to 50s.  ECG     - Personally Reviewed.  No EKG today  Physical Exam   GEN: No acute distress.   Neck: No JVD Cardiac: RRR, no murmurs, rubs, or gallops.  Respiratory: Clear to auscultation bilaterally. GI: Soft, nontender, non-distended  MS: No edema; No deformity. Neuro:  Nonfocal  Psych: Normal affect   Labs    Chemistry Recent Labs  Lab 07/05/17 [phone] 07/06/17 0802  07/08/17 8938 07/09/17  0932 07/10/17 0646 07/11/17 0538  NA 138 137   < > 142 142 140 140  K 3.9 3.6   < > 3.9 3.5 3.8 3.3*  CL 97* 97*   < > 96* 96* 93* 96*  CO2 26 30   < > 33* 33* 31 33*  GLUCOSE 241* 244*   < > 228* 108* 148* 111*  BUN 66* 63*   < > 57* 57* 57* 54*  CREATININE 2.22* 2.11*   < > 1.84* 1.77* 1.96* 1.95*  CALCIUM 9.2 8.7*   < > 9.2 9.4 9.3 9.2  PROT 6.8 5.9*  --  6.3*  --   --   --   ALBUMIN 2.8* 2.6*  --  2.8*  --   --   --   AST 20 16  --  21  --   --   --   ALT 6* 8*  --  9*  --   --   --   ALKPHOS 138* 112  --  128*  --   --   --   BILITOT 0.5 0.6  --  0.4  --   --   --   GFRNONAA 27* 29*   < > 34* 35* 31* 31*  GFRAA 31* 33*   < > 39* 41* 36* 36*  ANIONGAP 15 10   < > 13 13 16* 11   < > = values in this interval not displayed.     Hematology Recent Labs  Lab 07/07/17 0515  07/08/17 0641 07/09/17 0641  WBC 6.1 7.3 10.3  RBC 3.41* 3.68* 3.64*  HGB 11.1* 11.9* 11.7*  HCT 33.9* 36.1* 35.8*  MCV 99.4 98.1 98.4  MCH 32.6 32.3 32.1  MCHC 32.7 33.0 32.7  RDW 13.2 13.0 13.2  PLT 305 348 341    Cardiac Enzymes Recent Labs  Lab 07/04/17 1428 07/05/17 0702  TROPONINI <0.03 <0.03    Recent Labs  Lab 07/04/17 1438  TROPIPOC 0.02     BNP Recent Labs  Lab 07/04/17 1428  BNP 38.2     DDimer No results for input(s): DDIMER in the last 168 hours.   Radiology    No results found.  Cardiac Studies   Echo:  07/06/17:  Study Conclusions  - Left ventricle: The cavity size was normal. Systolic function was normal. The estimated ejection fraction was in the range of 55% to 60%. Wall motion was normal; there were no regional wall motion abnormalities. Doppler parameters are consistent with abnormal left ventricular relaxation (grade 1 diastolic dysfunction). There was no evidence of elevated ventricular filling pressure by Doppler parameters. - Aortic valve: There was no regurgitation. - Mitral valve: There was trivial regurgitation. - Left atrium: The atrium was mildly dilated. - Right ventricle: Systolic function was normal. - Right atrium: The atrium was normal in size. - Tricuspid valve: There was trivial regurgitation. - Pulmonary arteries: Systolic pressure was within the normal range. - Inferior vena cava: The vessel was dilated. The respirophasic diameter changes were blunted (<50%), consistent with elevated central venous pressure. - Pericardium, extracardiac: There was no pericardial effusion.   Patient Profile     77 y.o. male with pmhx of CAD, PVD, HFpEF, CKD III, type II DM, and HTN who presented to the ED on 6/7 with left sided chest pain after a fall 5 days ago. Imaging was consistent with several rib fractures. Patient is on 80 mg lasix daily at home, but stopped taking this 5 days ago due  to pain limiting his  ability to use the bathroom. On admission he had worsening SOB and LE edema. EKG showed first degree heart block and his metoprolol was held. Subsequently developed tachycardia and cardiology was consulted.   Assessment & Plan    Acute on Chronic Diastolic Heart Failure Exacerbation: Due to stopping home lasix 80 mg qd x 5 days prior to [place]. Has been diuresing with IV lasix 60 mg BID.  Appears euvolemic now.  Tachy / Brady: Bradycardic on admission with HR 50 and 1st degree AV block. Metoprolol was held. Developed tachycardia yesterday, EKG with sinus rhythm & frequent PACs, possible atrial tachycardia. Review of telemetry shows sinus rhythm with heart rate in 40s and 50s.  No tachyarrhythmia since 5 PM yesterday. -Tachyarrhythmias resolved after resuming home dose of metoprolol. -Continue home dose of metoprolol 12.5 mg twice daily. -Is okay to have an heart rate in 40s if patient remained symptomatic. -Will need an outpatient follow-up with cardiology, for further management if continued to have paroxysmal SVT. -We will sign off at this time, please call if any other issues arise.  For questions or updates, please contact [place] Please consult [email or web address] for contact info under Cardiology/STEMI.      Rollene Rotunda  MD PGY2 07/11/2017, 11:00 AM

## 2017-11-28 DIAGNOSIS — T8453XA Infection and inflammatory reaction due to internal right knee prosthesis, initial encounter: Secondary | ICD-10-CM

## 2017-11-28 HISTORY — DX: Infection and inflammatory reaction due to internal right knee prosthesis, initial encounter: T84.53XA

## 2017-12-17 NOTE — Patient Instructions (Addendum)
Marc [name]  Dec 26, 1940    Your procedure is scheduled on:  12/22/2017   Report to [facility] Main  Entrance,  Report to admitting at   2:00 PM    Call this number if you have problems the morning of surgery [phone]        Remember: Do not eat food  :After Midnight. May have clear liquids from midnight until 10:30 AM of surgery then nothing by mouth.                                        BRUSH YOUR TEETH MORNING OF SURGERY AND RINSE YOUR MOUTH OUT, NO CHEWING GUM CANDY OR MINTS.       Take these medicines the morning of surgery with A SIP OF WATER:  Atorvastatin,  Stool Softner(docusate sodium),  Allopurinol,  Oxycodone if needed                      DO NOT TAKE ANY DIABETIC MEDICATION MORNING OF SURGERY                                    You may not have any metal on your body including hair pins and              piercings  Do not wear jewelry, , lotions, powders or perfumes, deodorant                         Men may shave face and neck.      Do not bring valuables to the hospital. [place].  Contacts, dentures or bridgework may not be worn into surgery.  Leave suitcase in the car. After surgery it may be brought to your room.     _____________________________________________________________________                CLEAR LIQUID DIET   Foods Allowed                                                                     Foods Excluded  Coffee and tea, regular and decaf                             liquids that you cannot  Plain Jell-O in any flavor                                             see through such as: Fruit ices (not with fruit pulp)  milk, soups, orange juice  Iced Popsicles                                    All solid food Carbonated beverages, regular and diet                                    Cranberry, grape and apple  juices Sports drinks like Gatorade Lightly seasoned clear broth or consume(fat free) Sugar, honey syrup  Sample Menu Breakfast                                Lunch                                     Supper Cranberry juice                    Beef broth                            Chicken broth Jell-O                                     Grape juice                           Apple juice Coffee or tea                        Jell-O                                      Popsicle                                                Coffee or tea                        Coffee or tea  _____________________________________________________________________  [facility] - Preparing for Surgery Before surgery, you can play an important role.  Because skin is not sterile, your skin needs to be as free of germs as possible.  You can reduce the number of germs on your skin by washing with CHG (chlorahexidine gluconate) soap before surgery.  CHG is an antiseptic cleaner which kills germs and bonds with the skin to continue killing germs even after washing. Please DO NOT use if you have an allergy to CHG or antibacterial soaps.  If your skin becomes reddened/irritated stop using the CHG and inform your nurse when you arrive at Short Stay. Do not shave (including legs and underarms) for at least 48 hours prior to the first CHG shower.  You may shave your face/neck. Please follow these instructions carefully:  1.  Shower with CHG Soap the night before surgery and the  morning of Surgery.  2.  If you choose to  wash your hair, wash your hair first as usual with your  normal  shampoo.  3.  After you shampoo, rinse your hair and body thoroughly to remove the  shampoo.                            4.  Use CHG as you would any other liquid soap.  You can apply chg directly  to the skin and wash                       Gently with a scrungie or clean washcloth.  5.  Apply the CHG Soap to your body ONLY FROM THE NECK DOWN.   Do not  use on face/ open                           Wound or open sores. Avoid contact with eyes, ears mouth and genitals (private parts).                       Wash face,  Genitals (private parts) with your normal soap.             6.  Wash thoroughly, paying special attention to the area where your surgery  will be performed.  7.  Thoroughly rinse your body with warm water from the neck down.  8.  DO NOT shower/wash with your normal soap after using and rinsing off  the CHG Soap.             9.  Pat yourself dry with a clean towel.            10.  Wear clean pajamas.            11.  Place clean sheets on your bed the night of your first shower and do not  sleep with pets. Day of Surgery : Do not apply any lotions/deodorants the morning of surgery.  Please wear clean clothes to the hospital/surgery center.  FAILURE TO FOLLOW THESE INSTRUCTIONS MAY RESULT IN THE CANCELLATION OF YOUR SURGERY PATIENT SIGNATURE_________________________________  NURSE SIGNATURE__________________________________    _______________________________________________________________________    WHAT IS A BLOOD TRANSFUSION? Blood Transfusion Information  A transfusion is the replacement of blood or some of its parts. Blood is made up of multiple cells which provide different functions.  Red blood cells carry oxygen and are used for blood loss replacement.  White blood cells fight against infection.  Platelets control bleeding.  Plasma helps clot blood.  Other blood products are available for specialized needs, such as hemophilia or other clotting disorders. BEFORE THE TRANSFUSION  Who gives blood for transfusions?   Healthy volunteers who are fully evaluated to make sure their blood is safe. This is blood bank blood. Transfusion therapy is the safest it has ever been in the practice of medicine. Before blood is taken from a donor, a complete history is taken to make sure that person has no history of diseases nor  engages in risky social behavior (examples are intravenous drug use or sexual activity with multiple partners). The donor's travel history is screened to minimize risk of transmitting infections, such as malaria. The donated blood is tested for signs of infectious diseases, such as HIV and hepatitis. The blood is then tested to be sure it is compatible with you in order to minimize the chance of a transfusion reaction. If  you or a relative donates blood, this is often done in anticipation of surgery and is not appropriate for emergency situations. It takes many days to process the donated blood. RISKS AND COMPLICATIONS Although transfusion therapy is very safe and saves many lives, the main dangers of transfusion include:   Getting an infectious disease.  Developing a transfusion reaction. This is an allergic reaction to something in the blood you were given. Every precaution is taken to prevent this. The decision to have a blood transfusion has been considered carefully by your caregiver before blood is given. Blood is not given unless the benefits outweigh the risks. AFTER THE TRANSFUSION  Right after receiving a blood transfusion, you will usually feel much better and more energetic. This is especially true if your red blood cells have gotten low (anemic). The transfusion raises the level of the red blood cells which carry oxygen, and this usually causes an energy increase.  The nurse administering the transfusion will monitor you carefully for complications. HOME CARE INSTRUCTIONS  No special instructions are needed after a transfusion. You may find your energy is better. Speak with your caregiver about any limitations on activity for underlying diseases you may have. SEEK MEDICAL CARE IF:   Your condition is not improving after your transfusion.  You develop redness or irritation at the intravenous (IV) site. SEEK IMMEDIATE MEDICAL CARE IF:  Any of the following symptoms occur over the next  12 hours:  Shaking chills.  You have a temperature by mouth above 102 F (38.9 C), not controlled by medicine.  Chest, back, or muscle pain.  People around you feel you are not acting correctly or are confused.  Shortness of breath or difficulty breathing.  Dizziness and fainting.  You get a rash or develop hives.  You have a decrease in urine output.  Your urine turns a dark color or changes to pink, red, or brown. Any of the following symptoms occur over the next 10 days:  You have a temperature by mouth above 102 F (38.9 C), not controlled by medicine.  Shortness of breath.  Weakness after normal activity.  The white part of the eye turns yellow (jaundice).  You have a decrease in the amount of urine or are urinating less often.  Your urine turns a dark color or changes to pink, red, or brown. Document Released: 01/12/2000 Document Revised: 04/08/2011 Document Reviewed: 08/31/2007 ExitCare Patient Information 2014 [place].  _______________________________________________________________________  Incentive Spirometer  An incentive spirometer is a tool that can help keep your lungs clear and active. This tool measures how well you are filling your lungs with each breath. Taking long deep breaths may help reverse or decrease the chance of developing breathing (pulmonary) problems (especially infection) following:  A long period of time when you are unable to move or be active. BEFORE THE PROCEDURE   If the spirometer includes an indicator to show your best effort, your nurse or respiratory therapist will set it to a desired goal.  If possible, sit up straight or lean slightly forward. Try not to slouch.  Hold the incentive spirometer in an upright position. INSTRUCTIONS FOR USE  1. Sit on the edge of your bed if possible, or sit up as far as you can in bed or on a chair. 2. Hold the incentive spirometer in an upright position. 3. Breathe out  normally. 4. Place the mouthpiece in your mouth and seal your lips tightly around it. 5. Breathe in slowly and as deeply  as possible, raising the piston or the ball toward the top of the column. 6. Hold your breath for 3-5 seconds or for as long as possible. Allow the piston or ball to fall to the bottom of the column. 7. Remove the mouthpiece from your mouth and breathe out normally. 8. Rest for a few seconds and repeat Steps 1 through 7 at least 10 times every 1-2 hours when you are awake. Take your time and take a few normal breaths between deep breaths. 9. The spirometer may include an indicator to show your best effort. Use the indicator as a goal to work toward during each repetition. 10. After each set of 10 deep breaths, practice coughing to be sure your lungs are clear. If you have an incision (the cut made at the time of surgery), support your incision when coughing by placing a pillow or rolled up towels firmly against it. Once you are able to get out of bed, walk around indoors and cough well. You may stop using the incentive spirometer when instructed by your caregiver.  RISKS AND COMPLICATIONS  Take your time so you do not get dizzy or light-headed.  If you are in pain, you may need to take or ask for pain medication before doing incentive spirometry. It is harder to take a deep breath if you are having pain. AFTER USE  Rest and breathe slowly and easily.  It can be helpful to keep track of a log of your progress. Your caregiver can provide you with a simple table to help with this. If you are using the spirometer at home, follow these instructions: [place] IF:   You are having difficultly using the spirometer.  You have trouble using the spirometer as often as instructed.  Your pain medication is not giving enough relief while using the spirometer.  You develop fever of 100.5 F (38.1 C) or higher. SEEK IMMEDIATE MEDICAL CARE IF:   You cough up bloody sputum  that had not been present before.  You develop fever of 102 F (38.9 C) or greater.  You develop worsening pain at or near the incision site. MAKE SURE YOU:   Understand these instructions.  Will watch your condition.  Will get help right away if you are not doing well or get worse. Document Released: 05/27/2006 Document Revised: 04/08/2011 Document Reviewed: 07/28/2006 [facility] Patient Information 2014 [place], [place].   ________________________________________________________________________

## 2017-12-17 NOTE — Progress Notes (Signed)
Need orders in epic Preop on 11/21 at 1030am.  Surgery on 11/25.

## 2017-12-18 ENCOUNTER — Encounter (HOSPITAL_COMMUNITY): Payer: Self-pay

## 2017-12-18 ENCOUNTER — Encounter (HOSPITAL_COMMUNITY)
Admission: RE | Admit: 2017-12-18 | Discharge: 2017-12-18 | Disposition: A | Discharge status: Home / Self Care | Payer: Medicare Other | Source: Ambulatory Visit | Attending: Orthopedic Surgery | Admitting: Orthopedic Surgery

## 2017-12-18 ENCOUNTER — Encounter (HOSPITAL_COMMUNITY): Payer: Self-pay | Admitting: *Deleted

## 2017-12-18 ENCOUNTER — Other Ambulatory Visit: Payer: Self-pay

## 2017-12-18 DIAGNOSIS — Z01812 Encounter for preprocedural laboratory examination: Secondary | ICD-10-CM | POA: Insufficient documentation

## 2017-12-18 HISTORY — DX: Unspecified diastolic (congestive) heart failure: I50.30

## 2017-12-18 HISTORY — DX: Long term (current) use of anticoagulants: Z79.01

## 2017-12-18 HISTORY — DX: Other specified postprocedural states: Z98.890

## 2017-12-18 HISTORY — DX: Personal history of peptic ulcer disease: Z87.11

## 2017-12-18 HISTORY — DX: Personal history of colonic polyps: Z86.010

## 2017-12-18 HISTORY — DX: Thoracic aortic aneurysm, without rupture: I71.2

## 2017-12-18 HISTORY — DX: Iron deficiency anemia, unspecified: D50.9

## 2017-12-18 HISTORY — DX: Aneurysm of the ascending aorta, without rupture: I71.21

## 2017-12-18 HISTORY — DX: Hyperlipidemia, unspecified: E78.5

## 2017-12-18 HISTORY — DX: Infection and inflammatory reaction due to internal right knee prosthesis, initial encounter: T84.53XA

## 2017-12-18 HISTORY — DX: Other forms of dyspnea: R06.09

## 2017-12-18 HISTORY — DX: Unspecified osteoarthritis, unspecified site: M19.90

## 2017-12-18 HISTORY — DX: Tobacco use: Z72.0

## 2017-12-18 HISTORY — DX: Pain in right knee: M25.561

## 2017-12-18 HISTORY — DX: Personal history of colon polyps, unspecified: Z86.0100

## 2017-12-18 HISTORY — DX: Dyspnea, unspecified: R06.00

## 2017-12-18 HISTORY — DX: Other chronic pain: G89.29

## 2017-12-18 LAB — BASIC METABOLIC PANEL
Anion gap: 10 (ref 5–15)
BUN: 45 mg/dL — ABNORMAL HIGH (ref 8–23)
CALCIUM: 9 mg/dL (ref 8.9–10.3)
CO2: 28 mmol/L (ref 22–32)
CREATININE: 1.68 mg/dL — AB (ref 0.61–1.24)
Chloride: 101 mmol/L (ref 98–111)
GFR calc Af Amer: 43 mL/min — ABNORMAL LOW (ref >60)
GFR calc non Af Amer: 37 mL/min — ABNORMAL LOW (ref >60)
GLUCOSE: 196 mg/dL — AB (ref 70–99)
Potassium: 3.6 mmol/L (ref 3.5–5.1)
Sodium: 139 mmol/L (ref 135–145)

## 2017-12-18 LAB — CBC
HCT: 40.1 % (ref 39.0–52.0)
Hemoglobin: 12.7 g/dL — ABNORMAL LOW (ref 13.0–17.0)
MCH: 31.4 pg (ref 26.0–34.0)
MCHC: 31.7 g/dL (ref 30.0–36.0)
MCV: 99.3 fL (ref 80.0–100.0)
Platelets: 330 10*3/uL (ref 150–400)
RBC: 4.04 MIL/uL — AB (ref 4.22–5.81)
RDW: 15.6 % — ABNORMAL HIGH (ref 11.5–15.5)
WBC: 10.5 10*3/uL (ref 4.0–10.5)
nRBC: 0 % (ref 0.0–0.2)

## 2017-12-18 LAB — HEMOGLOBIN A1C
Hgb A1c MFr Bld: 7.5 % — ABNORMAL HIGH (ref 4.8–5.6)
Mean Plasma Glucose: 168.55 mg/dL

## 2017-12-18 LAB — SURGICAL PCR SCREEN
MRSA, PCR: NEGATIVE
Staphylococcus aureus: NEGATIVE

## 2017-12-18 LAB — GLUCOSE, CAPILLARY: Glucose-Capillary: 234 mg/dL — ABNORMAL HIGH (ref 70–99)

## 2017-12-18 NOTE — Progress Notes (Addendum)
EKG  Dated 07-10-2017 in epic.  Chest CT dated 07-04-2017 in epic.  ECHO  Dated 07-06-2017 in epic.  ADDENDUM:   Pt asked to bring his spinal cord stimulator control with day of surgery. Also, pt requested for surgery his feet to be wrap in foam to protect from skin tears/ blisters.

## 2017-12-19 NOTE — H&P (Signed)
TOTAL KNEE ADMISSION H&P  Patient is being admitted for right total knee arthroplasty resection with articulating spacer.  Subjective:  Chief Complaint: Chronic prosthetic joint infection, right knee  HPI: Marc [name], 77 y.o. male has a history of right total knee arthroplasty performed by Dr. [name] in 2014 with known history of chronic prosthetic joint infection. He first presented to Molli Barrows, PA-C on 12/15/2017 with complaints of increased pain with swelling and redness in the medial knee. He had been taking doxycycline 100 mg BID which was given to him by infectious disease (Dr. Dairl Ponder). Denied any fevers or constitutional symptoms. The knee was aspirated and sent off for gram stain and culture. He was placed on Keflex 500 mg PO TID to take in addition to the doxycycline. He followed-up on 12/16/2017 with Dr. Wynelle Link, with fluid cultures labs showing: CRP was 33.6 mg/L, ESR was 65 mm/hr, white count of 11.8 /mm3. It was decided that the most appropriate action would be to proceed with a total knee arthroplasty resection and place an articulating spacer to hopefully avoid a future surgery.He discussed the patient's history, presenting complaints, and treatment options. The patient has a chronic infection in the knee and is experiencing an acute flare-up.   Patient Active Problem List   Diagnosis Date Noted  . Tachycardia-bradycardia syndrome ([place])   . Pressure injury of skin 07/05/2017  . Closed fracture of rib of left side 07/04/2017  . Acute on chronic diastolic CHF (congestive heart failure) ([place]) 07/04/2017  . Acute CHF (congestive heart failure) ([place]) 07/04/2017  . Chronic pain 03/08/2017  . Antibiotic long-term use 09/14/2015  . 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 Right foot infection 04/13/2015  . Pressure ulcer 03/20/2015  . Chronic diastolic CHF (congestive heart failure) ([place])   . Chronic anemia 03/01/2015  . Type 2 diabetes mellitus with renal  complication ([place]) 17/00/1749  . Atherosclerosis of native arteries of extremities with gangrene, right leg ([place]) 02/07/2015  . Acute renal failure superimposed on stage 3 chronic kidney disease ([place]) 01/16/2015  . Type 2 diabetes mellitus with right diabetic foot infection ([place])   . CAD (coronary artery disease), native coronary artery   . Ascending aortic aneurysm ([place])   . Mass of lower lobe of left lung   . Chronic kidney disease stage 3   . Peripheral vascular disease with claudication ([place])   . Gout 02/07/2013  . Hyperlipidemia 02/07/2013  . GERD (gastroesophageal reflux disease) 02/07/2013  . Type 2 diabetes mellitus with peripheral neuropathy ([facility])   . Spinal stenosis of lumbar region 01/14/2013  . Tobacco abuse disorder 03/10/2012  . Bradycardia:  Normal condition for this patient. Previously worked up. 02/05/2012  . Hx of peptic ulcer 05/21/2010  . Hx of adenomatous colonic polyps 05/21/2010  . Hypertensive heart disease   . Lumbar disc disease    Past Medical History:  Diagnosis Date  . Anticoagulant long-term use    plavix  . Bradycardia   . Chronic constipation   . Chronic pain of right knee   . CKD (chronic kidney disease), stage III ([place])   . Depression   . Diastolic CHF ([place])   . DOE (dyspnea on exertion)   . GERD (gastroesophageal reflux disease)   . Gout   . History of bleeding peptic ulcer   . History of colon polyps   . History of GI bleed 2007   upper gi bleed from peptic ulcer,  required transfusions  . Hyperlipidemia   .  Hypertension   . IDA (iron deficiency anemia)   . Infection of total right knee replacement ([place]) 11/2017   previous has had right TKA infeciton 04-14-2015  s/p  resection arthroplasty with antiobiotic spacer  . Insomnia   . Neuropathy   . OA (osteoarthritis)    knees, back  . PAD (peripheral artery disease) [facility] vascular-- dr Scot Dock   01/ 2017  s/p  right femoral peroneal bypass/ amputation right 1st and 2nd toes;    01/ 2014   s/p  stenting to left common/ external iliac   . 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 Thoracic ascending aortic aneurysm [facility])    followed by dr gerhardt-- last chest CT , stable at 4.5cm  . Tobacco abuse   . Type II diabetes mellitus ([place])    followed by pcp    Past Surgical History:  Procedure Laterality Date  . AMPUTATION Right 02/07/2015   Procedure: GREAT TOE RAY AMPUTATION RIGHT ;  Surgeon: Angelia [name], MD;  Location: [place];  Service: Vascular;  Laterality: Right;  . AMPUTATION TOE Right 03/21/2015   Procedure: AMPUTATION TOE-RIGHT SECOND TOE;  Surgeon: Angelia [name], MD;  Location: [place];  Service: Vascular;  Laterality: Right;  . ANTERIOR CERVICAL DECOMP/DISCECTOMY FUSION  ~ 2007  . ANTERIOR LAT LUMBAR FUSION Left 01/19/2014   Procedure: LATERAL INTERBODY FUSION 1 LEVEL;  Surgeon: Sinclair Ship, MD;  Location: [place];  Service: Orthopedics;  Laterality: Left;  Left lumbar 3-4 lateral interbody fusion with instrumentation, allograft  . APPLICATION OF WOUND VAC Right 02/07/2015   Procedure:  APPLICATION OF WOUND VAC right great toe amputation site.;  Surgeon: Angelia [name], MD;  Location: [place];  Service: Vascular;  Laterality: Right;  . APPLICATION OF WOUND VAC Right 03/21/2015   Procedure: APPLICATION OF WOUND VAC RIGHT FOOT;  Surgeon: Angelia [name], MD;  Location: [place];  Service: Vascular;  Laterality: Right;  . BYPASS GRAFT FEMORAL-PERONEAL Right 02/07/2015   Procedure: BYPASS GRAFT FEMORAL-PERONEAL Trunk WITH VEIN graft  right leg.;  Surgeon: Angelia [name], MD;  Location: [place];  Service: Vascular;  Laterality: Right;  . CATARACT EXTRACTION W/ INTRAOCULAR LENS  IMPLANT, BILATERAL  2018  . COLONOSCOPY    . ENDARTERECTOMY TIBIOPERONEAL Right 02/07/2015   Procedure: ENDARTERECTOMY TIBIOPERONEAL;  Surgeon: Angelia [name], MD;  Location: [place];  Service: Vascular;  Laterality: Right;  . EXCISIONAL TOTAL KNEE  ARTHROPLASTY WITH ANTIBIOTIC SPACERS Right 04/14/2015   Procedure: RIGHT KNEE [place] OUT AND PLACEMENT OF DRAINS;  Surgeon: Frederik Pear, MD;  Location: [place];  Service: Orthopedics;  Laterality: Right;  . INTRAOPERATIVE ARTERIOGRAM Right 02/07/2015   Procedure: INTRA OPERATIVE ARTERIOGRAM;  Surgeon: Angelia [name], MD;  Location: [place];  Service: Vascular;  Laterality: Right;  . IRRIGATION AND DEBRIDEMENT KNEE Right 04/14/2015   Procedure: IRRIGATION AND DEBRIDEMENT KNEE;  Surgeon: Frederik Pear, MD;  Location: [place];  Service: Orthopedics;  Laterality: Right;  . LOWER EXTREMITY ANGIOGRAM N/A 02/04/2012   Procedure: LOWER EXTREMITY ANGIOGRAM;  Surgeon: Lorretta Harp, MD;  Location: [facility] CATH LAB;  Service: Cardiovascular;  Laterality: N/A;  . LUMBAR FUSION  12-03-2012   dr dumonski   L4-5  . PATCH ANGIOPLASTY Right 02/07/2015   Procedure: Vein PATCH ANGIOPLASTY to tibioperoneal trunk;  Surgeon: Angelia [name], MD;  Location: [place];  Service: Vascular;  Laterality: Right;  . PERCUTANEOUS STENT INTERVENTION Left 02/04/2012   Procedure: PERCUTANEOUS STENT INTERVENTION;  Surgeon: Roderic Palau  Adora Fridge, MD;  Location: [place] CATH LAB;  Service: Cardiovascular;  Laterality: Left;  lt ext iliac stent  . PERIPHERAL VASCULAR CATHETERIZATION N/A 02/02/2015   Procedure: Abdominal Aortogram w/Lower Extremity;  Surgeon: Angelia [name], MD;  Location: [place] CV LAB;  Service: Cardiovascular;  Laterality: N/A;  . SPINAL CORD STIMULATOR INSERTION N/A 09/20/2016   Procedure: LUMBAR SPINAL CORD STIMULATOR INSERTION;  Surgeon: Clydell Hakim, MD;  Location: [place];  Service: Neurosurgery;  Laterality: N/A;  LUMBAR SPINAL CORD STIMULATOR INSERTION  . SPLENECTOMY  ~ 1957  . TONSILLECTOMY  ~ 1947  . TOTAL KNEE ARTHROPLASTY Right 02/03/2013   Procedure: TOTAL KNEE ARTHROPLASTY;  Surgeon: [name], MD;  Location: [place];  Service: Orthopedics;  Laterality: Right;  . UMBILICAL HERNIA REPAIR  2007  . UPPER ENDOSCOPY  W/ SCLEROTHERAPY  ~ 2007  . WOUND DEBRIDEMENT Right 03/21/2015   Procedure: DEBRIDEMENT WOUND of RIGHT FOOT;  Surgeon: Angelia [name], MD;  Location: [place];  Service: Vascular;  Laterality: Right;    No current facility-administered medications for this encounter.    Current Outpatient Medications  Medication Sig Dispense Refill Last Dose  . allopurinol (ZYLOPRIM) 300 MG tablet Take 300 mg by mouth 2 (two) times daily.    Past Month at Unknown time  . atorvastatin (LIPITOR) 10 MG tablet Take 10 mg by mouth every morning.    Past Month at Unknown time  . cephALEXin (KEFLEX) 500 MG capsule Take 500 mg by mouth 3 (three) times daily.     . clopidogrel (PLAVIX) 75 MG tablet Take 75 mg by mouth every other day.    Past Month at Unknown time  . docusate sodium (COLACE) 100 MG capsule Take 100 mg by mouth 2 (two) times daily.   Past Month at Unknown time  . doxycycline (MONODOX) 100 MG capsule Take 100 mg by mouth 2 (two) times daily.     . ferrous sulfate 325 (65 FE) MG tablet Take 325 mg by mouth daily with breakfast.     . furosemide (LASIX) 80 MG tablet Take 1 tablet (80 mg total) by mouth daily.  0   . gabapentin (NEURONTIN) 600 MG tablet Take 1 tablet (600 mg total) by mouth every 12 (twelve) hours. (Patient taking differently: Take 600 mg by mouth at bedtime. )     . glimepiride (AMARYL) 4 MG tablet Take 4 mg by mouth 2 (two) times daily at 10 AM and 5 PM. Taking Amaryl 4 mg 1-2 times per day. Patient states he takes Amaryl 4 mg QAM and if glucose is over 150 mg/dl later in the day he takes second dose of Amaryl 4 mg     . indomethacin (INDOCIN) 25 MG capsule Take 25 mg by mouth 2 (two) times daily as needed (pain.).      [name] oxyCODONE (ROXICODONE) 15 MG immediate release tablet Take 15 mg by mouth every 6 (six) hours as needed for pain.     . polyethylene glycol (MIRALAX / GLYCOLAX) packet Take 17 g by mouth at bedtime.     . Prenatal Vit-Fe Fumarate-FA (MULTIVITAMIN-PRENATAL) 27-0.8 MG  TABS tablet Take 1 tablet by mouth daily at 12 noon.   Past Month at Unknown time  . insulin glargine (LANTUS) 100 UNIT/ML injection Inject 0.07 mLs (7 Units total) into the skin at bedtime. (Patient not taking: Reported on 12/18/2017)   Not Taking at Unknown time  . metoprolol tartrate (LOPRESSOR) 25 MG tablet Take 0.5 tablets (12.5 mg  total) by mouth 2 (two) times daily. (Patient not taking: Reported on 07/04/2017) 60 tablet 1 Not Taking at Unknown time  . omeprazole (PRILOSEC) 20 MG capsule Take 20 mg by mouth as needed.   Not Taking at Unknown time  . oxyCODONE 10 MG TABS Take 1 tablet (10 mg total) by mouth every 6 (six) hours as needed for pain. (Patient not taking: Reported on 12/18/2017) 16 tablet 0 Not Taking at Unknown time  . senna-docusate (SENOKOT-S) 8.6-50 MG tablet Take 1 tablet by mouth at bedtime as needed for mild constipation. (Patient not taking: Reported on 07/04/2017) 30 tablet 1 Not Taking at Unknown time   Allergies  Allergen Reactions  . Actos [Pioglitazone] Swelling and Other (See Comments)    LOWER EXTREMITY EDEMA     Social History   Tobacco Use  . Smoking status: Current Every Day Smoker    Packs/day: 1.50    Years: 64.00    Pack years: 96.00    Types: Cigarettes  . Smokeless tobacco: Former Systems developer    Types: [place] date: 12/19/2007  . Tobacco comment: since age 59  Substance Use Topics  . Alcohol use: No    Alcohol/week: 0.0 standard drinks    Family History  Problem Relation Age of Onset  . Colon cancer Paternal Uncle        Uncle  . Heart disease Father   . Hypertension Father   . Heart attack Father   . Heart disease Mother   . Diabetes Mother   . Hypertension Mother      Review of Systems  Constitutional: Negative for chills and fever.  HENT: Negative for congestion, sore throat and tinnitus.   Eyes: Negative for double vision, photophobia and pain.  Respiratory: Negative for cough, shortness of breath and wheezing.   Cardiovascular:  Negative for chest pain, palpitations and orthopnea.  Gastrointestinal: Negative for heartburn, nausea and vomiting.  Genitourinary: Negative for dysuria, frequency and urgency.  Musculoskeletal: Positive for joint pain.  Neurological: Negative for dizziness, weakness and headaches.    Objective:  Physical Exam  Well-nourished and well developed. General: Alert and oriented x3, cooperative and pleasant, no acute distress. Head: normocephalic, atraumatic, neck supple. Eyes: EOMI. Respiratory: breath sounds clear in all fields, no wheezing, rales, or rhonchi. Cardiovascular: Regular rate and rhythm, no murmurs, gallops or rubs.  Abdomen: non-tender to palpation and soft, normoactive bowel sounds. Musculoskeletal: Right Knee Exam:  Shows an indurated area, inferior-medial to the knee.  The area is fluctuant.  There is no drainage at this time. No surrounding erythema,  No effusion. No Swelling. Range of motion is 5-110 degrees.  Stable knee Calves soft and nontender. Motor function intact in LE. Strength 5/5 LE bilaterally. Neuro: Distal pulses 2+. Sensation to light touch intact in LE.  Vital signs in last 24 hours: Temp:  [98.2 F (36.8 C)] 98.2 F (36.8 C) (11/21 1254) Pulse Rate:  [98] 98 (11/21 1254) Resp:  [16] 16 (11/21 1254) BP: (130)/(86) 130/86 (11/21 1254) SpO2:  [98 %] 98 % (11/21 1254) Weight:  [68.2 kg] 68.2 kg (11/21 1254)  Labs:   Estimated body mass index is 20.96 kg/m as calculated from the following:   Height as of 12/18/17: 5' 11"  (1.803 m).   Weight as of 12/18/17: 68.2 kg.   Assessment/Plan:  Chronic prosthetic joint infection, right knee with sinus tract formation  The patient history, physical examination, clinical judgment of the provider and imaging studies are consistent with  chronic prosthetic joint infection of the right knee and total knee arthroplasty resection is deemed medically necessary.The risks and benefits were presented and  reviewed. The risks due to aseptic loosening, infection, stiffness, thromboembolic complications and other imponderables were discussed. The patient acknowledged the explanation, agreed to proceed with the plan and consent was signed. Patient is being admitted for inpatient treatment for surgery, pain control, PT, OT, prophylactic antibiotics, VTE prophylaxis, progressive ambulation and ADL's and discharge planning. The patient is planning to be discharged home.  Theresa Duty, PA-C Orthopedic Surgery [facility] [place]

## 2017-12-21 MED ORDER — BUPIVACAINE LIPOSOME 1.3 % IJ SUSP
20.0000 mL | INTRAMUSCULAR | Status: DC
  Filled 2017-12-21: qty 20

## 2017-12-21 MED ORDER — TRANEXAMIC ACID 1000 MG/10ML IV SOLN
2000.0000 mg | INTRAVENOUS | Status: DC
  Filled 2017-12-21: qty 20

## 2017-12-22 ENCOUNTER — Inpatient Hospital Stay (HOSPITAL_COMMUNITY): Payer: Medicare Other | Admitting: Anesthesiology

## 2017-12-22 ENCOUNTER — Encounter (HOSPITAL_COMMUNITY)
Admission: RE | Disposition: A | Discharge status: Home Health | Payer: Self-pay | Source: Ambulatory Visit | Attending: Orthopedic Surgery

## 2017-12-22 ENCOUNTER — Encounter (HOSPITAL_COMMUNITY): Payer: Self-pay | Admitting: *Deleted

## 2017-12-22 ENCOUNTER — Inpatient Hospital Stay (HOSPITAL_COMMUNITY)
Admission: RE | Admit: 2017-12-22 | Discharge: 2017-12-25 | DRG: 467 | Disposition: A | Discharge status: Home Health | Payer: Medicare Other | Source: Ambulatory Visit | Attending: Orthopedic Surgery | Admitting: Orthopedic Surgery

## 2017-12-22 DIAGNOSIS — A4902 Methicillin resistant Staphylococcus aureus infection, unspecified site: Secondary | ICD-10-CM | POA: Diagnosis not present

## 2017-12-22 DIAGNOSIS — F419 Anxiety disorder, unspecified: Secondary | ICD-10-CM | POA: Diagnosis present

## 2017-12-22 DIAGNOSIS — I5032 Chronic diastolic (congestive) heart failure: Secondary | ICD-10-CM | POA: Diagnosis present

## 2017-12-22 DIAGNOSIS — N183 Chronic kidney disease, stage 3 (moderate): Secondary | ICD-10-CM | POA: Diagnosis present

## 2017-12-22 DIAGNOSIS — E1122 Type 2 diabetes mellitus with diabetic chronic kidney disease: Secondary | ICD-10-CM | POA: Diagnosis present

## 2017-12-22 DIAGNOSIS — M109 Gout, unspecified: Secondary | ICD-10-CM | POA: Diagnosis present

## 2017-12-22 DIAGNOSIS — F17210 Nicotine dependence, cigarettes, uncomplicated: Secondary | ICD-10-CM | POA: Diagnosis present

## 2017-12-22 DIAGNOSIS — E1151 Type 2 diabetes mellitus with diabetic peripheral angiopathy without gangrene: Secondary | ICD-10-CM | POA: Diagnosis present

## 2017-12-22 DIAGNOSIS — Z89421 Acquired absence of other right toe(s): Secondary | ICD-10-CM

## 2017-12-22 DIAGNOSIS — E785 Hyperlipidemia, unspecified: Secondary | ICD-10-CM | POA: Diagnosis present

## 2017-12-22 DIAGNOSIS — Z7902 Long term (current) use of antithrombotics/antiplatelets: Secondary | ICD-10-CM

## 2017-12-22 DIAGNOSIS — I2510 Atherosclerotic heart disease of native coronary artery without angina pectoris: Secondary | ICD-10-CM | POA: Diagnosis present

## 2017-12-22 DIAGNOSIS — Z8614 Personal history of Methicillin resistant Staphylococcus aureus infection: Secondary | ICD-10-CM | POA: Diagnosis not present

## 2017-12-22 DIAGNOSIS — Z794 Long term (current) use of insulin: Secondary | ICD-10-CM

## 2017-12-22 DIAGNOSIS — T8453XA Infection and inflammatory reaction due to internal right knee prosthesis, initial encounter: Secondary | ICD-10-CM | POA: Diagnosis present

## 2017-12-22 DIAGNOSIS — Z888 Allergy status to other drugs, medicaments and biological substances status: Secondary | ICD-10-CM

## 2017-12-22 DIAGNOSIS — M009 Pyogenic arthritis, unspecified: Secondary | ICD-10-CM | POA: Diagnosis not present

## 2017-12-22 DIAGNOSIS — Z89411 Acquired absence of right great toe: Secondary | ICD-10-CM

## 2017-12-22 DIAGNOSIS — Z981 Arthrodesis status: Secondary | ICD-10-CM | POA: Diagnosis not present

## 2017-12-22 DIAGNOSIS — I130 Hypertensive heart and chronic kidney disease with heart failure and stage 1 through stage 4 chronic kidney disease, or unspecified chronic kidney disease: Secondary | ICD-10-CM | POA: Diagnosis present

## 2017-12-22 DIAGNOSIS — K219 Gastro-esophageal reflux disease without esophagitis: Secondary | ICD-10-CM | POA: Diagnosis present

## 2017-12-22 DIAGNOSIS — F329 Major depressive disorder, single episode, unspecified: Secondary | ICD-10-CM | POA: Diagnosis not present

## 2017-12-22 DIAGNOSIS — Z9862 Peripheral vascular angioplasty status: Secondary | ICD-10-CM | POA: Diagnosis not present

## 2017-12-22 DIAGNOSIS — M00061 Staphylococcal arthritis, right knee: Secondary | ICD-10-CM | POA: Diagnosis not present

## 2017-12-22 DIAGNOSIS — R451 Restlessness and agitation: Secondary | ICD-10-CM | POA: Diagnosis not present

## 2017-12-22 DIAGNOSIS — Z79899 Other long term (current) drug therapy: Secondary | ICD-10-CM

## 2017-12-22 DIAGNOSIS — Z96651 Presence of right artificial knee joint: Secondary | ICD-10-CM | POA: Diagnosis not present

## 2017-12-22 DIAGNOSIS — Y831 Surgical operation with implant of artificial internal device as the cause of abnormal reaction of the patient, or of later complication, without mention of misadventure at the time of the procedure: Secondary | ICD-10-CM | POA: Diagnosis present

## 2017-12-22 HISTORY — DX: Other constipation: K59.09

## 2017-12-22 HISTORY — DX: Insomnia, unspecified: G47.00

## 2017-12-22 HISTORY — PX: EXCISIONAL TOTAL KNEE ARTHROPLASTY WITH ANTIBIOTIC SPACERS: SHX5827

## 2017-12-22 LAB — COMPREHENSIVE METABOLIC PANEL
ALBUMIN: 3.4 g/dL — AB (ref 3.5–5.0)
ALT: 7 U/L (ref 0–44)
AST: 18 U/L (ref 15–41)
Alkaline Phosphatase: 118 U/L (ref 38–126)
Anion gap: 12 (ref 5–15)
BUN: 43 mg/dL — ABNORMAL HIGH (ref 8–23)
CHLORIDE: 98 mmol/L (ref 98–111)
CO2: 26 mmol/L (ref 22–32)
CREATININE: 1.76 mg/dL — AB (ref 0.61–1.24)
Calcium: 8.9 mg/dL (ref 8.9–10.3)
GFR calc Af Amer: 41 mL/min — ABNORMAL LOW (ref >60)
GFR calc non Af Amer: 35 mL/min — ABNORMAL LOW (ref >60)
GLUCOSE: 243 mg/dL — AB (ref 70–99)
Potassium: 3.4 mmol/L — ABNORMAL LOW (ref 3.5–5.1)
SODIUM: 136 mmol/L (ref 135–145)
Total Bilirubin: 0.6 mg/dL (ref 0.3–1.2)
Total Protein: 7.2 g/dL (ref 6.5–8.1)

## 2017-12-22 LAB — TYPE AND SCREEN
ABO/RH(D): O NEG
ANTIBODY SCREEN: NEGATIVE

## 2017-12-22 LAB — CBC
HCT: 39 % (ref 39.0–52.0)
HEMOGLOBIN: 12.4 g/dL — AB (ref 13.0–17.0)
MCH: 31.4 pg (ref 26.0–34.0)
MCHC: 31.8 g/dL (ref 30.0–36.0)
MCV: 98.7 fL (ref 80.0–100.0)
NRBC: 0 % (ref 0.0–0.2)
PLATELETS: 291 10*3/uL (ref 150–400)
RBC: 3.95 MIL/uL — AB (ref 4.22–5.81)
RDW: 15.9 % — ABNORMAL HIGH (ref 11.5–15.5)
WBC: 9.5 10*3/uL (ref 4.0–10.5)

## 2017-12-22 LAB — GLUCOSE, CAPILLARY
GLUCOSE-CAPILLARY: 238 mg/dL — AB (ref 70–99)
Glucose-Capillary: 116 mg/dL — ABNORMAL HIGH (ref 70–99)

## 2017-12-22 LAB — PROTIME-INR
INR: 1.01
Prothrombin Time: 13.2 seconds (ref 11.4–15.2)

## 2017-12-22 LAB — APTT: APTT: 36 s (ref 24–36)

## 2017-12-22 LAB — ABO/RH: ABO/RH(D): O NEG

## 2017-12-22 SURGERY — REMOVAL, TOTAL ARTHROPLASTY HARDWARE, KNEE, WITH ANTIBIOTIC SPACER INSERTION
Anesthesia: General | Site: Knee | Laterality: Right

## 2017-12-22 MED ORDER — ONDANSETRON HCL 4 MG/2ML IJ SOLN
INTRAMUSCULAR | Status: DC | PRN
  Administered 2017-12-22: 4 mg via INTRAVENOUS

## 2017-12-22 MED ORDER — ONDANSETRON HCL 4 MG/2ML IJ SOLN: 4.0000 mg | Freq: Four times a day (QID) | INTRAMUSCULAR | Status: DC | PRN

## 2017-12-22 MED ORDER — ROCURONIUM BROMIDE 10 MG/ML (PF) SYRINGE
PREFILLED_SYRINGE | INTRAVENOUS | Status: DC | PRN
  Administered 2017-12-22: 40 mg via INTRAVENOUS

## 2017-12-22 MED ORDER — ONDANSETRON HCL 4 MG PO TABS: 4.0000 mg | ORAL_TABLET | Freq: Four times a day (QID) | ORAL | Status: DC | PRN

## 2017-12-22 MED ORDER — HYDROMORPHONE HCL 1 MG/ML IJ SOLN
INTRAMUSCULAR | Status: DC | PRN
  Administered 2017-12-22 (×2): 0.5 mg via INTRAVENOUS

## 2017-12-22 MED ORDER — GLIMEPIRIDE 4 MG PO TABS
4.0000 mg | ORAL_TABLET | Freq: Every day | ORAL | Status: DC | PRN
  Filled 2017-12-22: qty 1

## 2017-12-22 MED ORDER — SODIUM CHLORIDE 0.9 % IR SOLN
Status: DC | PRN
  Administered 2017-12-22: 1000 mL

## 2017-12-22 MED ORDER — FENTANYL CITRATE (PF) 100 MCG/2ML IJ SOLN
INTRAMUSCULAR | Status: AC
  Filled 2017-12-22: qty 2

## 2017-12-22 MED ORDER — LACTATED RINGERS IV SOLN
INTRAVENOUS | Status: DC
  Administered 2017-12-22: 15:00:00 via INTRAVENOUS

## 2017-12-22 MED ORDER — OXYCODONE HCL 5 MG/5ML PO SOLN
5.0000 mg | Freq: Once | ORAL | Status: DC | PRN
  Filled 2017-12-22: qty 5

## 2017-12-22 MED ORDER — METHOCARBAMOL 500 MG IVPB - SIMPLE MED
500.0000 mg | Freq: Four times a day (QID) | INTRAVENOUS | Status: DC | PRN
  Administered 2017-12-22: 500 mg via INTRAVENOUS
  Filled 2017-12-22: qty 50

## 2017-12-22 MED ORDER — SODIUM CHLORIDE 0.9 % IV SOLN
INTRAVENOUS | Status: DC | PRN
  Administered 2017-12-22: 50 ug/min via INTRAVENOUS

## 2017-12-22 MED ORDER — MENTHOL 3 MG MT LOZG: 1.0000 | LOZENGE | OROMUCOSAL | Status: DC | PRN

## 2017-12-22 MED ORDER — CEFAZOLIN SODIUM-DEXTROSE 2-4 GM/100ML-% IV SOLN
2.0000 g | INTRAVENOUS | Status: AC
  Administered 2017-12-22: 2 g via INTRAVENOUS
  Filled 2017-12-22: qty 100

## 2017-12-22 MED ORDER — SUGAMMADEX SODIUM 200 MG/2ML IV SOLN
INTRAVENOUS | Status: DC | PRN
  Administered 2017-12-22: 280 mg via INTRAVENOUS

## 2017-12-22 MED ORDER — CEFAZOLIN SODIUM-DEXTROSE 2-4 GM/100ML-% IV SOLN
INTRAVENOUS | Status: AC
  Filled 2017-12-22: qty 100

## 2017-12-22 MED ORDER — FUROSEMIDE 40 MG PO TABS
80.0000 mg | ORAL_TABLET | Freq: Every day | ORAL | Status: DC
  Administered 2017-12-23 – 2017-12-25 (×3): 80 mg via ORAL
  Filled 2017-12-22 (×3): qty 2

## 2017-12-22 MED ORDER — KETAMINE HCL 10 MG/ML IJ SOLN
INTRAMUSCULAR | Status: AC
  Filled 2017-12-22: qty 1

## 2017-12-22 MED ORDER — OXYCODONE HCL 5 MG PO TABS
5.0000 mg | ORAL_TABLET | ORAL | Status: DC | PRN
  Filled 2017-12-22: qty 2

## 2017-12-22 MED ORDER — ATORVASTATIN CALCIUM 10 MG PO TABS
10.0000 mg | ORAL_TABLET | Freq: Every day | ORAL | Status: DC
  Administered 2017-12-23 – 2017-12-25 (×3): 10 mg via ORAL
  Filled 2017-12-22 (×3): qty 1

## 2017-12-22 MED ORDER — PHENYLEPHRINE 40 MCG/ML (10ML) SYRINGE FOR IV PUSH (FOR BLOOD PRESSURE SUPPORT)
PREFILLED_SYRINGE | INTRAVENOUS | Status: DC | PRN
  Administered 2017-12-22: 160 ug via INTRAVENOUS
  Administered 2017-12-22: 120 ug via INTRAVENOUS

## 2017-12-22 MED ORDER — DEXAMETHASONE SODIUM PHOSPHATE 10 MG/ML IJ SOLN
8.0000 mg | Freq: Once | INTRAMUSCULAR | Status: AC
  Administered 2017-12-22: 8 mg via INTRAVENOUS

## 2017-12-22 MED ORDER — OXYCODONE HCL 5 MG PO TABS: 5.0000 mg | ORAL_TABLET | Freq: Once | ORAL | Status: DC | PRN

## 2017-12-22 MED ORDER — TRANEXAMIC ACID-NACL 1000-0.7 MG/100ML-% IV SOLN
1000.0000 mg | INTRAVENOUS | Status: DC
  Filled 2017-12-22 (×2): qty 100

## 2017-12-22 MED ORDER — HYDROMORPHONE HCL 1 MG/ML IJ SOLN
INTRAMUSCULAR | Status: AC
  Administered 2017-12-22: 0.5 mg via INTRAVENOUS
  Filled 2017-12-22: qty 2

## 2017-12-22 MED ORDER — ROPIVACAINE HCL 5 MG/ML IJ SOLN
INTRAMUSCULAR | Status: DC | PRN
  Administered 2017-12-22: 30 mL via PERINEURAL

## 2017-12-22 MED ORDER — INDOMETHACIN 25 MG PO CAPS
25.0000 mg | ORAL_CAPSULE | Freq: Two times a day (BID) | ORAL | Status: DC | PRN
  Administered 2017-12-24 – 2017-12-25 (×2): 25 mg via ORAL
  Filled 2017-12-22 (×4): qty 1

## 2017-12-22 MED ORDER — BUPIVACAINE LIPOSOME 1.3 % IJ SUSP
INTRAMUSCULAR | Status: DC | PRN
  Administered 2017-12-22: 20 mL

## 2017-12-22 MED ORDER — SODIUM CHLORIDE (PF) 0.9 % IJ SOLN
INTRAMUSCULAR | Status: AC
  Filled 2017-12-22: qty 10

## 2017-12-22 MED ORDER — POLYETHYLENE GLYCOL 3350 17 G PO PACK: 17.0000 g | PACK | Freq: Every day | ORAL | Status: DC | PRN

## 2017-12-22 MED ORDER — SODIUM CHLORIDE 0.9 % IR SOLN
Status: DC | PRN
  Administered 2017-12-22: 3000 mL

## 2017-12-22 MED ORDER — PHENYLEPHRINE HCL 10 MG/ML IJ SOLN
INTRAMUSCULAR | Status: AC
  Filled 2017-12-22: qty 2

## 2017-12-22 MED ORDER — HYDROMORPHONE HCL 1 MG/ML IJ SOLN
0.2500 mg | INTRAMUSCULAR | Status: DC | PRN
  Administered 2017-12-22 (×4): 0.5 mg via INTRAVENOUS

## 2017-12-22 MED ORDER — DOCUSATE SODIUM 100 MG PO CAPS
100.0000 mg | ORAL_CAPSULE | Freq: Two times a day (BID) | ORAL | Status: DC
  Administered 2017-12-22 – 2017-12-25 (×6): 100 mg via ORAL
  Filled 2017-12-22 (×6): qty 1

## 2017-12-22 MED ORDER — PROPOFOL 10 MG/ML IV BOLUS
INTRAVENOUS | Status: AC
  Filled 2017-12-22: qty 40

## 2017-12-22 MED ORDER — ACETAMINOPHEN 500 MG PO TABS
1000.0000 mg | ORAL_TABLET | Freq: Four times a day (QID) | ORAL | Status: AC
  Administered 2017-12-22 – 2017-12-23 (×4): 1000 mg via ORAL
  Filled 2017-12-22 (×4): qty 2

## 2017-12-22 MED ORDER — MORPHINE SULFATE (PF) 2 MG/ML IV SOLN
1.0000 mg | INTRAVENOUS | Status: DC | PRN
  Administered 2017-12-24: 1 mg via INTRAVENOUS
  Filled 2017-12-22: qty 1

## 2017-12-22 MED ORDER — SODIUM CHLORIDE (PF) 0.9 % IJ SOLN
INTRAMUSCULAR | Status: AC
  Filled 2017-12-22: qty 50

## 2017-12-22 MED ORDER — CHLORHEXIDINE GLUCONATE 4 % EX LIQD
60.0000 mL | Freq: Once | CUTANEOUS | Status: AC
  Administered 2017-12-22: 4 via TOPICAL

## 2017-12-22 MED ORDER — BISACODYL 10 MG RE SUPP: 10.0000 mg | Freq: Every day | RECTAL | Status: DC | PRN

## 2017-12-22 MED ORDER — METOCLOPRAMIDE HCL 5 MG/ML IJ SOLN: 5.0000 mg | Freq: Three times a day (TID) | INTRAMUSCULAR | Status: DC | PRN

## 2017-12-22 MED ORDER — ONDANSETRON HCL 4 MG/2ML IJ SOLN
INTRAMUSCULAR | Status: AC
  Filled 2017-12-22: qty 2

## 2017-12-22 MED ORDER — FENTANYL CITRATE (PF) 100 MCG/2ML IJ SOLN
50.0000 ug | Freq: Once | INTRAMUSCULAR | Status: AC
  Administered 2017-12-22: 100 ug via INTRAVENOUS
  Filled 2017-12-22: qty 2

## 2017-12-22 MED ORDER — GLIMEPIRIDE 4 MG PO TABS: 4.0000 mg | ORAL_TABLET | Freq: Two times a day (BID) | ORAL | Status: DC

## 2017-12-22 MED ORDER — METOCLOPRAMIDE HCL 5 MG PO TABS: 5.0000 mg | ORAL_TABLET | Freq: Three times a day (TID) | ORAL | Status: DC | PRN

## 2017-12-22 MED ORDER — PROPOFOL 10 MG/ML IV BOLUS
INTRAVENOUS | Status: AC
  Filled 2017-12-22: qty 20

## 2017-12-22 MED ORDER — TRANEXAMIC ACID 1000 MG/10ML IV SOLN
INTRAVENOUS | Status: DC | PRN
  Administered 2017-12-22: 2000 mg via TOPICAL

## 2017-12-22 MED ORDER — MIDAZOLAM HCL 2 MG/2ML IJ SOLN
INTRAMUSCULAR | Status: AC
  Filled 2017-12-22: qty 2

## 2017-12-22 MED ORDER — GABAPENTIN 300 MG PO CAPS
600.0000 mg | ORAL_CAPSULE | Freq: Every day | ORAL | Status: DC
  Administered 2017-12-22 – 2017-12-24 (×3): 600 mg via ORAL
  Filled 2017-12-22 (×3): qty 2

## 2017-12-22 MED ORDER — METHOCARBAMOL 500 MG PO TABS
500.0000 mg | ORAL_TABLET | Freq: Four times a day (QID) | ORAL | Status: DC | PRN
  Administered 2017-12-23 – 2017-12-25 (×6): 500 mg via ORAL
  Filled 2017-12-22 (×7): qty 1

## 2017-12-22 MED ORDER — HYDROMORPHONE HCL 2 MG/ML IJ SOLN
INTRAMUSCULAR | Status: AC
  Filled 2017-12-22: qty 1

## 2017-12-22 MED ORDER — OXYCODONE HCL 5 MG PO TABS: 15.0000 mg | ORAL_TABLET | Freq: Four times a day (QID) | ORAL | Status: DC | PRN

## 2017-12-22 MED ORDER — FERROUS SULFATE 325 (65 FE) MG PO TABS
325.0000 mg | ORAL_TABLET | Freq: Every day | ORAL | Status: DC
  Administered 2017-12-23 – 2017-12-25 (×3): 325 mg via ORAL
  Filled 2017-12-22 (×3): qty 1

## 2017-12-22 MED ORDER — SODIUM CHLORIDE (PF) 0.9 % IJ SOLN
INTRAMUSCULAR | Status: DC | PRN
  Administered 2017-12-22: 60 mL

## 2017-12-22 MED ORDER — GLIMEPIRIDE 4 MG PO TABS
4.0000 mg | ORAL_TABLET | Freq: Every day | ORAL | Status: DC
  Administered 2017-12-23 – 2017-12-25 (×3): 4 mg via ORAL
  Filled 2017-12-22 (×3): qty 1

## 2017-12-22 MED ORDER — SUGAMMADEX SODIUM 500 MG/5ML IV SOLN
INTRAVENOUS | Status: AC
  Filled 2017-12-22: qty 5

## 2017-12-22 MED ORDER — STERILE WATER FOR IRRIGATION IR SOLN
Status: DC | PRN
  Administered 2017-12-22: 2000 mL

## 2017-12-22 MED ORDER — DIPHENHYDRAMINE HCL 12.5 MG/5ML PO ELIX: 12.5000 mg | ORAL_SOLUTION | ORAL | Status: DC | PRN

## 2017-12-22 MED ORDER — PROPOFOL 10 MG/ML IV BOLUS
INTRAVENOUS | Status: DC | PRN
  Administered 2017-12-22: 130 mg via INTRAVENOUS

## 2017-12-22 MED ORDER — ACETAMINOPHEN 10 MG/ML IV SOLN
1000.0000 mg | Freq: Four times a day (QID) | INTRAVENOUS | Status: DC
  Administered 2017-12-22: 1000 mg via INTRAVENOUS
  Filled 2017-12-22 (×2): qty 100

## 2017-12-22 MED ORDER — MIDAZOLAM HCL 2 MG/2ML IJ SOLN
1.0000 mg | Freq: Once | INTRAMUSCULAR | Status: AC
  Administered 2017-12-22: 2 mg via INTRAVENOUS
  Filled 2017-12-22: qty 2

## 2017-12-22 MED ORDER — ONDANSETRON HCL 4 MG/2ML IJ SOLN: 4.0000 mg | Freq: Once | INTRAMUSCULAR | Status: DC | PRN

## 2017-12-22 MED ORDER — KETAMINE HCL 10 MG/ML IJ SOLN
INTRAMUSCULAR | Status: DC | PRN
  Administered 2017-12-22: 10 mg via INTRAVENOUS
  Administered 2017-12-22: 20 mg via INTRAVENOUS

## 2017-12-22 MED ORDER — DEXAMETHASONE SODIUM PHOSPHATE 10 MG/ML IJ SOLN
INTRAMUSCULAR | Status: AC
  Filled 2017-12-22: qty 1

## 2017-12-22 MED ORDER — RIVAROXABAN 10 MG PO TABS
10.0000 mg | ORAL_TABLET | Freq: Every day | ORAL | Status: DC
  Administered 2017-12-23: 10 mg via ORAL
  Filled 2017-12-22: qty 1

## 2017-12-22 MED ORDER — FENTANYL CITRATE (PF) 250 MCG/5ML IJ SOLN
INTRAMUSCULAR | Status: DC | PRN
  Administered 2017-12-22 (×2): 50 ug via INTRAVENOUS

## 2017-12-22 MED ORDER — PANTOPRAZOLE SODIUM 40 MG PO TBEC
40.0000 mg | DELAYED_RELEASE_TABLET | Freq: Every day | ORAL | Status: DC | PRN
  Administered 2017-12-24: 40 mg via ORAL
  Filled 2017-12-22: qty 1

## 2017-12-22 MED ORDER — CEFAZOLIN SODIUM-DEXTROSE 2-4 GM/100ML-% IV SOLN
2.0000 g | Freq: Four times a day (QID) | INTRAVENOUS | Status: DC
  Administered 2017-12-22 – 2017-12-23 (×3): 2 g via INTRAVENOUS
  Filled 2017-12-22 (×3): qty 100

## 2017-12-22 MED ORDER — OXYCODONE HCL 5 MG PO TABS
10.0000 mg | ORAL_TABLET | ORAL | Status: DC | PRN
  Administered 2017-12-22: 10 mg via ORAL
  Administered 2017-12-23 – 2017-12-25 (×13): 15 mg via ORAL
  Filled 2017-12-22 (×13): qty 3

## 2017-12-22 MED ORDER — POLYETHYLENE GLYCOL 3350 17 G PO PACK
17.0000 g | PACK | Freq: Every day | ORAL | Status: DC
  Administered 2017-12-22: 17 g via ORAL
  Filled 2017-12-22 (×3): qty 1

## 2017-12-22 MED ORDER — METHOCARBAMOL 500 MG IVPB - SIMPLE MED
INTRAVENOUS | Status: AC
  Filled 2017-12-22: qty 50

## 2017-12-22 MED ORDER — EPHEDRINE SULFATE-NACL 50-0.9 MG/10ML-% IV SOSY
PREFILLED_SYRINGE | INTRAVENOUS | Status: DC | PRN
  Administered 2017-12-22: 5 mg via INTRAVENOUS
  Administered 2017-12-22: 10 mg via INTRAVENOUS

## 2017-12-22 MED ORDER — FLEET ENEMA 7-19 GM/118ML RE ENEM: 1.0000 | ENEMA | Freq: Once | RECTAL | Status: DC | PRN

## 2017-12-22 MED ORDER — SODIUM CHLORIDE 0.9 % IV SOLN
INTRAVENOUS | Status: DC
  Administered 2017-12-22 – 2017-12-25 (×3): via INTRAVENOUS

## 2017-12-22 MED ORDER — DEXAMETHASONE SODIUM PHOSPHATE 10 MG/ML IJ SOLN
10.0000 mg | Freq: Once | INTRAMUSCULAR | Status: AC
  Administered 2017-12-23: 10 mg via INTRAVENOUS
  Filled 2017-12-22: qty 1

## 2017-12-22 MED ORDER — LIDOCAINE 2% (20 MG/ML) 5 ML SYRINGE
INTRAMUSCULAR | Status: DC | PRN
  Administered 2017-12-22: 60 mg via INTRAVENOUS

## 2017-12-22 MED ORDER — PHENOL 1.4 % MT LIQD
1.0000 | OROMUCOSAL | Status: DC | PRN
  Filled 2017-12-22: qty 177

## 2017-12-22 MED ORDER — ALLOPURINOL 300 MG PO TABS
300.0000 mg | ORAL_TABLET | Freq: Two times a day (BID) | ORAL | Status: DC
  Administered 2017-12-22 – 2017-12-25 (×6): 300 mg via ORAL
  Filled 2017-12-22 (×6): qty 1

## 2017-12-22 SURGICAL SUPPLY — 67 items
BAG SPEC THK2 15X12 ZIP CLS (MISCELLANEOUS) ×1
BAG ZIPLOCK 12X15 (MISCELLANEOUS) ×3 IMPLANT
BANDAGE ACE 6X5 VEL STRL LF (GAUZE/BANDAGES/DRESSINGS) ×3 IMPLANT
BANDAGE ESMARK 6X9 LF (GAUZE/BANDAGES/DRESSINGS) ×1 IMPLANT
BLADE SAG 18X100X1.27 (BLADE) ×3 IMPLANT
BLADE SAW SGTL 11.0X1.19X90.0M (BLADE) ×3 IMPLANT
BNDG CMPR 9X6 STRL LF SNTH (GAUZE/BANDAGES/DRESSINGS) ×1
BNDG ESMARK 6X9 LF (GAUZE/BANDAGES/DRESSINGS) ×3
BONE CEMENT GENTAMICIN (Cement) ×9 IMPLANT
CEMENT BONE GENTAMICIN 40 (Cement) ×3 IMPLANT
COVER SURGICAL LIGHT HANDLE (MISCELLANEOUS) ×3 IMPLANT
COVER WAND RF STERILE (DRAPES) ×2 IMPLANT
CUFF TOURN SGL QUICK 34 (TOURNIQUET CUFF) ×6
CUFF TRNQT CYL 34X4X40X1 (TOURNIQUET CUFF) ×2 IMPLANT
DRAPE EXTREMITY T 121X128X90 (DRAPE) ×3 IMPLANT
DRAPE POUCH INSTRU U-SHP 10X18 (DRAPES) ×3 IMPLANT
DRAPE U-SHAPE 47X51 STRL (DRAPES) ×3 IMPLANT
DRSG ADAPTIC 3X8 NADH LF (GAUZE/BANDAGES/DRESSINGS) ×3 IMPLANT
DURAPREP 26ML APPLICATOR (WOUND CARE) ×3 IMPLANT
ELECT REM PT RETURN 15FT ADLT (MISCELLANEOUS) ×3 IMPLANT
EVACUATOR 1/8 PVC DRAIN (DRAIN) ×3 IMPLANT
FACESHIELD WRAPAROUND (MASK) ×15 IMPLANT
FACESHIELD WRAPAROUND OR TEAM (MASK) ×5 IMPLANT
FEMUR SIGMA PS SZ 5.0 R (Femur) ×2 IMPLANT
GAUZE SPONGE 4X4 12PLY STRL (GAUZE/BANDAGES/DRESSINGS) ×3 IMPLANT
GLOVE BIO SURGEON STRL SZ7.5 (GLOVE) ×3 IMPLANT
GLOVE BIO SURGEON STRL SZ8 (GLOVE) ×6 IMPLANT
GLOVE BIOGEL PI IND STRL 6.5 (GLOVE) IMPLANT
GLOVE BIOGEL PI IND STRL 7.0 (GLOVE) ×1 IMPLANT
GLOVE BIOGEL PI IND STRL 7.5 (GLOVE) IMPLANT
GLOVE BIOGEL PI IND STRL 8 (GLOVE) ×1 IMPLANT
GLOVE BIOGEL PI IND STRL 8.5 (GLOVE) IMPLANT
GLOVE BIOGEL PI INDICATOR 6.5 (GLOVE) ×4
GLOVE BIOGEL PI INDICATOR 7.0 (GLOVE) ×2
GLOVE BIOGEL PI INDICATOR 7.5 (GLOVE) ×4
GLOVE BIOGEL PI INDICATOR 8 (GLOVE) ×2
GLOVE BIOGEL PI INDICATOR 8.5 (GLOVE) ×2
GLOVE SS PI 9.0 STRL (GLOVE) ×2 IMPLANT
GLOVE SURG SS PI 6.5 STRL IVOR (GLOVE) ×4 IMPLANT
GLOVE SURG SS PI 7.0 STRL IVOR (GLOVE) ×3 IMPLANT
GOWN SPEC L3 XXLG W/TWL (GOWN DISPOSABLE) ×2 IMPLANT
GOWN STRL REUS W/TWL LRG LVL3 (GOWN DISPOSABLE) ×8 IMPLANT
GOWN STRL REUS W/TWL XL LVL3 (GOWN DISPOSABLE) ×3 IMPLANT
HANDPIECE INTERPULSE COAX TIP (DISPOSABLE) ×3
IMMOBILIZER KNEE 20 (SOFTGOODS) ×3
IMMOBILIZER KNEE 20 THIGH 36 (SOFTGOODS) IMPLANT
MANIFOLD NEPTUNE II (INSTRUMENTS) ×3 IMPLANT
PAD ABD 8X10 STRL (GAUZE/BANDAGES/DRESSINGS) ×2 IMPLANT
PADDING CAST COTTON 6X4 STRL (CAST SUPPLIES) ×8 IMPLANT
PLATE ROT INSERT 17.5MM SIZE5 (Plate) ×2 IMPLANT
PROTECTOR NERVE ULNAR (MISCELLANEOUS) ×3 IMPLANT
SET HNDPC FAN SPRY TIP SCT (DISPOSABLE) ×1 IMPLANT
STAPLER VISISTAT 35W (STAPLE) ×3 IMPLANT
SUCTION FRAZIER HANDLE 10FR (MISCELLANEOUS) ×2
SUCTION TUBE FRAZIER 10FR DISP (MISCELLANEOUS) ×1 IMPLANT
SUT PDS AB 1 CT1 27 (SUTURE) ×1 IMPLANT
SUT STRATAFIX 0 PDS 27 VIOLET (SUTURE) ×3
SUT VIC AB 2-0 CT1 27 (SUTURE) ×9
SUT VIC AB 2-0 CT1 TAPERPNT 27 (SUTURE) ×3 IMPLANT
SUTURE STRATFX 0 PDS 27 VIOLET (SUTURE) IMPLANT
SWAB COLLECTION DEVICE MRSA (MISCELLANEOUS) ×5 IMPLANT
SWAB CULTURE ESWAB REG 1ML (MISCELLANEOUS) ×5 IMPLANT
TOWEL OR 17X26 10 PK STRL BLUE (TOWEL DISPOSABLE) ×3 IMPLANT
TOWER CARTRIDGE SMART MIX (DISPOSABLE) ×3 IMPLANT
TRAY FOLEY MTR SLVR 16FR STAT (SET/KITS/TRAYS/PACK) ×3 IMPLANT
WRAP KNEE MAXI GEL POST OP (GAUZE/BANDAGES/DRESSINGS) ×4 IMPLANT
YANKAUER SUCT BULB TIP 10FT TU (MISCELLANEOUS) ×2 IMPLANT

## 2017-12-22 NOTE — Anesthesia Postprocedure Evaluation (Signed)
Anesthesia Post Note  Patient: Marc [name]  Procedure(s) Performed: right knee resection arthroplasty; antibiotic spacer (Right Knee)     Patient location during evaluation: PACU Anesthesia Type: General Level of consciousness: awake and alert Pain management: pain level controlled Vital Signs Assessment: post-procedure vital signs reviewed and stable Respiratory status: spontaneous breathing, nonlabored ventilation, respiratory function stable and patient connected to nasal cannula oxygen Cardiovascular status: blood pressure returned to baseline and stable Postop Assessment: no apparent nausea or vomiting Anesthetic complications: no    Last Vitals:  Vitals:   12/22/17 1815 12/22/17 1830  BP: 103/67 121/72  Pulse: 84 80  Resp: 12 12  Temp:    SpO2: 100% 100%    Last Pain:  Vitals:   12/22/17 1830  TempSrc:   PainSc: 8                  Lidia Collum

## 2017-12-22 NOTE — Progress Notes (Signed)
AssistedDr. Carolyn Witman with right, ultrasound guided, adductor canal block. Side rails up, monitors on throughout procedure. See vital signs in flow sheet. Tolerated Procedure well.  

## 2017-12-22 NOTE — Transfer of Care (Signed)
Immediate Anesthesia Transfer of Care Note  Patient: Marc [name]  Procedure(s) Performed: right knee resection arthroplasty; antibiotic spacer (Right Knee)  Patient Location: PACU  Anesthesia Type:General  Level of Consciousness: awake, alert  and oriented  Airway & Oxygen Therapy: Patient Spontanous Breathing and Patient connected to face mask oxygen  Post-op Assessment: Report given to RN and Post -op Vital signs reviewed and stable  Post vital signs: Reviewed and stable  Last Vitals:  Vitals Value Taken Time  BP    Temp    Pulse    Resp    SpO2      Last Pain:  Vitals:   12/22/17 1400  TempSrc: Oral         Complications: No apparent anesthesia complications

## 2017-12-22 NOTE — Anesthesia Procedure Notes (Addendum)
Anesthesia Regional Block: Adductor canal block   Pre-Anesthetic Checklist: ,, timeout performed, Correct Patient, Correct Site, Correct Laterality, Correct Procedure, Correct Position, site marked, Risks and benefits discussed,  Surgical consent,  Pre-op evaluation,  At surgeon's request and post-op pain management  Laterality: Right  Prep: chloraprep       Needles:  Injection technique: Single-shot  Needle Type: Echogenic Stimulator Needle     Needle Length: 9cm  Needle Gauge: 21     Additional Needles:   Procedures:,,,, ultrasound used (permanent image in chart),,,,  Narrative:  Start time: 12/22/2017 3:54 PM End time: 12/22/2017 4:00 PM Injection made incrementally with aspirations every 5 mL.  Performed by: Personally  Anesthesiologist: Lidia Collum, MD  Additional Notes: Monitors applied. Injection made in 5cc increments. No resistance to injection. Good needle visualization. Patient tolerated procedure well.

## 2017-12-22 NOTE — Anesthesia Preprocedure Evaluation (Signed)
Anesthesia Evaluation  Patient identified by MRN, date of birth, ID band Patient awake    Reviewed: Allergy & Precautions, NPO status , Patient's Chart, lab work & pertinent test results  History of Anesthesia Complications Negative for: history of anesthetic complications  Airway Mallampati: II  TM Distance: >3 FB Neck ROM: Full    Dental  (+) Edentulous Upper, Poor Dentition   Pulmonary neg pulmonary ROS, Current Smoker,    Pulmonary exam normal        Cardiovascular hypertension, + CAD, + Peripheral Vascular Disease and +CHF  Normal cardiovascular exam  TTE 07/06/17: EF 55-60%, grade 1 DD, no significant valvular abnormality MPS 01/24/12: normal, no ischemia   Neuro/Psych PSYCHIATRIC DISORDERS Depression Chronic pain s/p spinal cord stimulator implant    GI/Hepatic Neg liver ROS, PUD, GERD  Medicated,  Endo/Other  diabetes, Poorly Controlled, Type 2, Insulin Dependent, Oral Hypoglycemic Agents  Renal/GU Renal InsufficiencyRenal disease  negative genitourinary   Musculoskeletal  (+) Arthritis ,   Abdominal   Peds  Hematology negative hematology ROS (+)   Anesthesia Other Findings   Reproductive/Obstetrics                            Anesthesia Physical Anesthesia Plan  ASA: III  Anesthesia Plan: General   Post-op Pain Management: GA combined w/ Regional for post-op pain   Induction: Intravenous  PONV Risk Score and Plan: 1 and Ondansetron, Dexamethasone, Midazolam and Treatment may vary due to age or medical condition  Airway Management Planned: Oral ETT  Additional Equipment: None  Intra-op Plan:   Post-operative Plan: Extubation in OR  Informed Consent: I have reviewed the patients History and Physical, chart, labs and discussed the procedure including the risks, benefits and alternatives for the proposed anesthesia with the patient or authorized representative who has  indicated his/her understanding and acceptance.     Plan Discussed with:   Anesthesia Plan Comments:         Anesthesia Quick Evaluation

## 2017-12-22 NOTE — Interval H&P Note (Signed)
History and Physical Interval Note:  12/22/2017 3:15 PM  Marc [name]  has presented today for surgery, with the diagnosis of infected right total knee arthroplasty  The various methods of treatment have been discussed with the patient and family. After consideration of risks, benefits and other options for treatment, the patient has consented to  Procedure(s) with comments: right knee resection arthroplasty; antibiotic spacer (Right) - 153min as a surgical intervention .  The patient's history has been reviewed, patient examined, no change in status, stable for surgery.  I have reviewed the patient's chart and labs.  Questions were answered to the patient's satisfaction.     Pilar Plate Liani Caris

## 2017-12-22 NOTE — Anesthesia Procedure Notes (Signed)
Procedure Name: Intubation Date/Time: 12/22/2017 4:26 PM Performed by: Niel Hummer, CRNA Pre-anesthesia Checklist: Patient identified, Emergency Drugs available, Suction available and Patient being monitored Patient Re-evaluated:Patient Re-evaluated prior to induction Oxygen Delivery Method: Circle system utilized Preoxygenation: Pre-oxygenation with 100% oxygen Induction Type: IV induction Ventilation: Mask ventilation without difficulty Laryngoscope Size: Mac and 4 Grade View: Grade I Tube size: 7.5 mm Number of attempts: 1 Airway Equipment and Method: Stylet Placement Confirmation: ETT inserted through vocal cords under direct vision,  positive ETCO2 and breath sounds checked- equal and bilateral Secured at: 22 cm Tube secured with: Tape Dental Injury: Teeth and Oropharynx as per pre-operative assessment

## 2017-12-22 NOTE — Discharge Instructions (Addendum)
Dr. Gaynelle Arabian Total Joint Specialist [facility] [address], [place], [place] [address] [phone]  POSTOPERATIVE DIRECTIONS  Knee Rehabilitation, Guidelines Following Surgery  Results after knee surgery are often greatly improved when you follow the exercise, range of motion and muscle strengthening exercises prescribed by your doctor. Safety measures are also important to protect the knee from further injury. Any time any of these exercises cause you to have increased pain or swelling in your knee joint, decrease the amount until you are comfortable again and slowly increase them. If you have problems or questions, call your caregiver or physical therapist for advice.   HOME CARE INSTRUCTIONS   Remove items at home which could result in a fall. This includes throw rugs or furniture in walking pathways.   ICE to the affected knee every three hours for 30 minutes at a time and then as needed for pain and swelling.  Continue to use ice on the knee for pain and swelling from surgery. You may notice swelling that will progress down to the foot and ankle.  This is normal after surgery.  Elevate the leg when you are not up walking on it.    Continue to use the breathing machine which will help keep your temperature down.  It is common for your temperature to cycle up and down following surgery, especially at night when you are not up moving around and exerting yourself.  The breathing machine keeps your lungs expanded and your temperature down.  Do not place pillow under knee, focus on keeping the knee straight while resting  DIET You may resume your previous home diet once your are discharged from the hospital.  DRESSING / WOUND CARE / SHOWERING You may shower 3 days after surgery, but keep the wounds dry during showering.  You may use an occlusive plastic wrap (Press'n Seal for example), NO SOAKING/SUBMERGING IN THE BATHTUB.  If the bandage gets wet, change with a  clean dry gauze.  If the incision gets wet, pat the wound dry with a clean towel. You may start showering once you are discharged home but do not submerge the incision under water. Just pat the incision dry and apply a dry gauze dressing on daily. Change the surgical dressing daily and reapply a dry dressing each time.  ACTIVITY Walk with your walker as instructed. Use walker as long as suggested by your caregivers. Avoid periods of inactivity such as sitting longer than an hour when not asleep. This helps prevent blood clots.  You may resume a sexual relationship in one month or when given the OK by your doctor.  You may return to work once you are cleared by your doctor.  Do not drive a car for 6 weeks or until released by you surgeon.  Do not drive while taking narcotics.  WEIGHT BEARING Weight bearing as tolerated with assist device (walker, cane, etc) as directed, use it as long as suggested by your surgeon or therapist, typically at least 4-6 weeks.  POSTOPERATIVE CONSTIPATION PROTOCOL Constipation - defined medically as fewer than three stools per week and severe constipation as less than one stool per week.  One of the most common issues patients have following surgery is constipation.  Even if you have a regular bowel pattern at home, your normal regimen is likely to be disrupted due to multiple reasons following surgery.  Combination of anesthesia, postoperative narcotics, change in appetite and fluid intake all can affect your bowels.  In order  to avoid complications following surgery, here are some recommendations in order to help you during your recovery period.  Colace (docusate) - Pick up an over-the-counter form of Colace or another stool softener and take twice a day as long as you are requiring postoperative pain medications.  Take with a full glass of water daily.  If you experience loose stools or diarrhea, hold the colace until you stool forms back up.  If your symptoms do  not get better within 1 week or if they get worse, check with your doctor.  Dulcolax (bisacodyl) - Pick up over-the-counter and take as directed by the product packaging as needed to assist with the movement of your bowels.  Take with a full glass of water.  Use this product as needed if not relieved by Colace only.   MiraLax (polyethylene glycol) - Pick up over-the-counter to have on hand.  MiraLax is a solution that will increase the amount of water in your bowels to assist with bowel movements.  Take as directed and can mix with a glass of water, juice, soda, coffee, or tea.  Take if you go more than two days without a movement. Do not use MiraLax more than once per day. Call your doctor if you are still constipated or irregular after using this medication for 7 days in a row.  If you continue to have problems with postoperative constipation, please contact the office for further assistance and recommendations.  If you experience "the worst abdominal pain ever" or develop nausea or vomiting, please contact the office immediatly for further recommendations for treatment.  ITCHING  If you experience itching with your medications, try taking only a single pain pill, or even half a pain pill at a time.  You can also use Benadryl over the counter for itching or also to help with sleep.   TED HOSE STOCKINGS Wear the elastic stockings on both legs for three weeks following surgery during the day but you may remove then at night for sleeping.  MEDICATIONS See your medication summary on the After Visit Summary that the nursing staff will review with you prior to discharge.  You may have some home medications which will be placed on hold until you complete the course of blood thinner medication.  It is important for you to complete the blood thinner medication as prescribed by your surgeon.  Continue your approved medications as instructed at time of discharge.  PRECAUTIONS If you experience chest pain  or shortness of breath - call 911 immediately for transfer to the hospital emergency department.  If you develop a fever greater that 101 F, purulent drainage from wound, increased redness or drainage from wound, foul odor from the wound/dressing, or calf pain - CONTACT YOUR SURGEON.                                                   FOLLOW-UP APPOINTMENTS Make sure you keep all of your appointments after your operation with your surgeon and caregivers. You should call the office at the above phone number and make an appointment for approximately two weeks after the date of your surgery or on the date instructed by your surgeon outlined in the "After Visit Summary".   RANGE OF MOTION AND STRENGTHENING EXERCISES  Rehabilitation of the knee is important following a knee injury or an operation.  After just a few days of immobilization, the muscles of the thigh which control the knee become weakened and shrink (atrophy). Knee exercises are designed to build up the tone and strength of the thigh muscles and to improve knee motion. Often times heat used for twenty to thirty minutes before working out will loosen up your tissues and help with improving the range of motion but do not use heat for the first two weeks following surgery. These exercises can be done on a training (exercise) mat, on the floor, on a table or on a bed. Use what ever works the best and is most comfortable for you Knee exercises include:   Leg Lifts - While your knee is still immobilized in a splint or cast, you can do straight leg raises. Lift the leg to 60 degrees, hold for 3 sec, and slowly lower the leg. Repeat 10-20 times 2-3 times daily. Perform this exercise against resistance later as your knee gets better.   Quad and Hamstring Sets - Tighten up the muscle on the front of the thigh (Quad) and hold for 5-10 sec. Repeat this 10-20 times hourly. Hamstring sets are done by pushing the foot backward against an object and holding for  5-10 sec. Repeat as with quad sets.   Leg Slides: Lying on your back, slowly slide your foot toward your buttocks, bending your knee up off the floor (only go as far as is comfortable). Then slowly slide your foot back down until your leg is flat on the floor again.  Angel Wings: Lying on your back spread your legs to the side as far apart as you can without causing discomfort.  A rehabilitation program following serious knee injuries can speed recovery and prevent re-injury in the future due to weakened muscles. Contact your doctor or a physical therapist for more information on knee rehabilitation.   IF YOU ARE TRANSFERRED TO A SKILLED REHAB FACILITY If the patient is transferred to a skilled rehab facility following release from the hospital, a list of the current medications will be sent to the facility for the patient to continue.  When discharged from the skilled rehab facility, please have the facility set up the patient's [place] prior to being released. Also, the skilled facility will be responsible for providing the patient with their medications at time of release from the facility to include their pain medication, the muscle relaxants, and their blood thinner medication. If the patient is still at the rehab facility at time of the two week follow up appointment, the skilled rehab facility will also need to assist the patient in arranging follow up appointment in our office and any transportation needs.  MAKE SURE YOU:   Understand these instructions.   Get help right away if you are not doing well or get worse.    Pick up stool softner and laxative for home use following surgery while on pain medications. Do not submerge incision under water. Please use good hand washing techniques while changing dressing each day. May shower starting three days after surgery. Please use a clean towel to pat the incision dry following showers. Continue to use ice for pain and  swelling after surgery. Do not use any lotions or creams on the incision until instructed by your surgeon.

## 2017-12-22 NOTE — Brief Op Note (Signed)
12/22/2017  5:32 PM  PATIENT:  Marc [name]  77 y.o. male  PRE-OPERATIVE DIAGNOSIS:  infected right total knee arthroplasty  POST-OPERATIVE DIAGNOSIS:  infected right total knee arthroplasty  PROCEDURE:  Procedure(s) with comments: right knee resection arthroplasty; antibiotic spacer (Right) - Adductor Block  SURGEON:  Surgeon(s) and Role:    Gaynelle Arabian, MD - Primary  PHYSICIAN ASSISTANT:   ASSISTANTS: Theresa Duty, PA-C   ANESTHESIA:   spinal  EBL:  50 mL   BLOOD ADMINISTERED:none  DRAINS: (Medium) Hemovact drain(s) in the right knee with  Suction Open   LOCAL MEDICATIONS USED:  OTHER Exparel  COUNTS:  YES  TOURNIQUET:   Total Tourniquet Time Documented: Thigh (Right) - 37 minutes Total: Thigh (Right) - 37 minutes   DICTATION: .Other Dictation: Dictation Number [id number]  PLAN OF CARE: Admit to inpatient   PATIENT DISPOSITION:  PACU - hemodynamically stable.

## 2017-12-23 ENCOUNTER — Encounter (HOSPITAL_COMMUNITY): Payer: Self-pay | Admitting: *Deleted

## 2017-12-23 ENCOUNTER — Inpatient Hospital Stay: Payer: Self-pay

## 2017-12-23 ENCOUNTER — Other Ambulatory Visit: Payer: Self-pay

## 2017-12-23 DIAGNOSIS — Z89411 Acquired absence of right great toe: Secondary | ICD-10-CM

## 2017-12-23 DIAGNOSIS — F17210 Nicotine dependence, cigarettes, uncomplicated: Secondary | ICD-10-CM

## 2017-12-23 DIAGNOSIS — M009 Pyogenic arthritis, unspecified: Secondary | ICD-10-CM

## 2017-12-23 DIAGNOSIS — Z792 Long term (current) use of antibiotics: Secondary | ICD-10-CM

## 2017-12-23 DIAGNOSIS — Z9862 Peripheral vascular angioplasty status: Secondary | ICD-10-CM

## 2017-12-23 DIAGNOSIS — F329 Major depressive disorder, single episode, unspecified: Secondary | ICD-10-CM

## 2017-12-23 DIAGNOSIS — T8453XA Infection and inflammatory reaction due to internal right knee prosthesis, initial encounter: Principal | ICD-10-CM

## 2017-12-23 DIAGNOSIS — E1122 Type 2 diabetes mellitus with diabetic chronic kidney disease: Secondary | ICD-10-CM

## 2017-12-23 DIAGNOSIS — Z888 Allergy status to other drugs, medicaments and biological substances status: Secondary | ICD-10-CM

## 2017-12-23 DIAGNOSIS — Z96651 Presence of right artificial knee joint: Secondary | ICD-10-CM

## 2017-12-23 DIAGNOSIS — N183 Chronic kidney disease, stage 3 (moderate): Secondary | ICD-10-CM

## 2017-12-23 DIAGNOSIS — R451 Restlessness and agitation: Secondary | ICD-10-CM

## 2017-12-23 DIAGNOSIS — Z89421 Acquired absence of other right toe(s): Secondary | ICD-10-CM

## 2017-12-23 DIAGNOSIS — I2510 Atherosclerotic heart disease of native coronary artery without angina pectoris: Secondary | ICD-10-CM

## 2017-12-23 DIAGNOSIS — F419 Anxiety disorder, unspecified: Secondary | ICD-10-CM

## 2017-12-23 DIAGNOSIS — Z8614 Personal history of Methicillin resistant Staphylococcus aureus infection: Secondary | ICD-10-CM

## 2017-12-23 DIAGNOSIS — E1151 Type 2 diabetes mellitus with diabetic peripheral angiopathy without gangrene: Secondary | ICD-10-CM

## 2017-12-23 LAB — CBC
HCT: 35.3 % — ABNORMAL LOW (ref 39.0–52.0)
Hemoglobin: 10.9 g/dL — ABNORMAL LOW (ref 13.0–17.0)
MCH: 31.7 pg (ref 26.0–34.0)
MCHC: 30.9 g/dL (ref 30.0–36.0)
MCV: 102.6 fL — ABNORMAL HIGH (ref 80.0–100.0)
NRBC: 0 % (ref 0.0–0.2)
PLATELETS: 228 10*3/uL (ref 150–400)
RBC: 3.44 MIL/uL — ABNORMAL LOW (ref 4.22–5.81)
RDW: 15.9 % — AB (ref 11.5–15.5)
WBC: 8 10*3/uL (ref 4.0–10.5)

## 2017-12-23 LAB — BASIC METABOLIC PANEL
ANION GAP: 12 (ref 5–15)
BUN: 47 mg/dL — AB (ref 8–23)
CALCIUM: 8.6 mg/dL — AB (ref 8.9–10.3)
CO2: 23 mmol/L (ref 22–32)
Chloride: 103 mmol/L (ref 98–111)
Creatinine, Ser: 1.91 mg/dL — ABNORMAL HIGH (ref 0.61–1.24)
GFR calc Af Amer: 37 mL/min — ABNORMAL LOW (ref >60)
GFR, EST NON AFRICAN AMERICAN: 32 mL/min — AB (ref >60)
Glucose, Bld: 273 mg/dL — ABNORMAL HIGH (ref 70–99)
Potassium: 5.1 mmol/L (ref 3.5–5.1)
SODIUM: 138 mmol/L (ref 135–145)

## 2017-12-23 LAB — GLUCOSE, CAPILLARY
GLUCOSE-CAPILLARY: 223 mg/dL — AB (ref 70–99)
GLUCOSE-CAPILLARY: 274 mg/dL — AB (ref 70–99)
Glucose-Capillary: 243 mg/dL — ABNORMAL HIGH (ref 70–99)

## 2017-12-23 MED ORDER — ASPIRIN 325 MG PO TABS
325.0000 mg | ORAL_TABLET | Freq: Every day | ORAL | Status: DC
  Administered 2017-12-23 – 2017-12-25 (×3): 325 mg via ORAL
  Filled 2017-12-23 (×3): qty 1

## 2017-12-23 MED ORDER — CEFAZOLIN SODIUM-DEXTROSE 2-4 GM/100ML-% IV SOLN: 2.0000 g | Freq: Two times a day (BID) | INTRAVENOUS | Status: DC

## 2017-12-23 MED ORDER — SODIUM CHLORIDE 0.9% FLUSH
10.0000 mL | Freq: Two times a day (BID) | INTRAVENOUS | Status: DC
  Administered 2017-12-23 – 2017-12-24 (×2): 10 mL

## 2017-12-23 MED ORDER — SODIUM CHLORIDE 0.9% FLUSH
10.0000 mL | INTRAVENOUS | Status: DC | PRN
  Administered 2017-12-24: 20 mL
  Administered 2017-12-25: 10 mL
  Filled 2017-12-23 (×2): qty 40

## 2017-12-23 MED ORDER — SODIUM CHLORIDE 0.9 % IV SOLN: 6.0000 mg/kg | INTRAVENOUS | Status: DC

## 2017-12-23 MED ORDER — INSULIN ASPART 100 UNIT/ML ~~LOC~~ SOLN
0.0000 [IU] | Freq: Three times a day (TID) | SUBCUTANEOUS | Status: DC
  Administered 2017-12-23: 5 [IU] via SUBCUTANEOUS
  Administered 2017-12-23: 8 [IU] via SUBCUTANEOUS
  Administered 2017-12-25 (×2): 3 [IU] via SUBCUTANEOUS

## 2017-12-23 MED ORDER — SODIUM CHLORIDE 0.9 % IV SOLN
6.0000 mg/kg | INTRAVENOUS | Status: AC
  Administered 2017-12-23: 408 mg via INTRAVENOUS
  Filled 2017-12-23: qty 8.16

## 2017-12-23 NOTE — Plan of Care (Signed)

## 2017-12-23 NOTE — Progress Notes (Signed)
Peripherally Inserted Central Catheter/Midline Placement  The IV Nurse has discussed with the patient and/or persons authorized to consent for the patient, the purpose of this procedure and the potential benefits and risks involved with this procedure.  The benefits include less needle sticks, lab draws from the catheter, and the patient may be discharged home with the catheter. Risks include, but not limited to, infection, bleeding, blood clot (thrombus formation), and puncture of an artery; nerve damage and irregular heartbeat and possibility to perform a PICC exchange if needed/ordered by physician.  Alternatives to this procedure were also discussed.  Bard Power PICC patient education guide, fact sheet on infection prevention and patient information card has been provided to patient /or left at bedside.    PICC/Midline Placement Documentation  PICC Single Lumen 41/63/84 PICC Right Basilic 38 cm 0 cm (Active)  Indication for Insertion or Continuance of Line Home intravenous therapies (PICC only) 12/23/2017  9:32 AM  Exposed Catheter (cm) 0 cm 12/23/2017  9:32 AM  Site Assessment Clean;Dry;Intact 12/23/2017  9:32 AM  Line Status Flushed;Blood return noted 12/23/2017  9:32 AM  Dressing Type Transparent 12/23/2017  9:32 AM  Dressing Status Clean;Dry;Intact;Antimicrobial disc in place 12/23/2017  9:32 AM  Dressing Intervention New dressing 12/23/2017  9:32 AM  Dressing Change Due 12/30/17 12/23/2017  9:32 AM       Christella Noa Albarece 12/23/2017, 9:33 AM

## 2017-12-23 NOTE — Progress Notes (Signed)
PHARMACY NOTE:  ANTIMICROBIAL RENAL DOSAGE ADJUSTMENT  Current antimicrobial regimen includes a mismatch between antimicrobial dosage and estimated renal function. As per policy approved by the Pharmacy & Therapeutics and Medical Executive Committees, the antimicrobial dosage will be adjusted accordingly.  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 []      On intermittent HD, scheduled: []      On CRRT    Antimicrobial dosage has been changed to:  Ancef 2g IV q12 hr   Additional Comments: Typical prophylaxis lasts 24 hrs, but for this patient, it was to continue until further assessment of his infection (ie. > 24 hr) so would require renal adjustment.  Thank you for allowing pharmacy to be a part of this patient's care.  Reuel Boom, PharmD, BCPS [phone] 12/23/2017, 3:16 PM

## 2017-12-23 NOTE — Evaluation (Signed)
Physical Therapy Evaluation Patient Details Name: Marc [name] MRN: [id number] DOB: 1940/11/20 Today's Date: 12/23/2017   History of Present Illness  Pt is a 77 year old male s/p right knee resection arthroplasty and antibiotic spacer placement with hx of CAD, PAD, CHF, DMII, multiple back surgeries, and R first ray amputation.  Clinical Impression  Patient is s/p above surgery resulting in functional limitations due to the deficits listed below (see PT Problem List).  Patient will benefit from skilled PT to increase their independence and safety with mobility to allow discharge to the venue listed below.  Pt reluctant to mobilize and use RW however agreeable to OOB to recliner and more agreeable to use RW after transfer using SPC.  Uncertain of d/c plan at this time however pt may benefit from SNF depending on his progress.  Pt has steps to enter home and reports he lives with someone but they may not assist him.    Follow Up Recommendations Follow surgeon's recommendation for DC plan and follow-up therapies    Equipment Recommendations  None recommended by PT    Recommendations for Other Services       Precautions / Restrictions Precautions Precautions: Fall Precaution Comments: spacer - limited knee flexion Required Braces or Orthoses: Knee Immobilizer - Right Restrictions Weight Bearing Restrictions: No Other Position/Activity Restrictions: WBAT      Mobility  Bed Mobility Overal bed mobility: Needs Assistance Bed Mobility: Supine to Sit     Supine to sit: Min assist;HOB elevated     General bed mobility comments: verbal cues for self assist, assist for R LE  Transfers Overall transfer level: Needs assistance Equipment used: Straight cane;1 person hand held assist Transfers: Sit to/from [facility] Sit to Stand: Min assist Stand pivot transfers: Min assist       General transfer comment: pt refusing to use RW, requested cane, therapist  provided HHA once pt realized he would need bil UE support, cues for R LE positioning, pt more agreeable to use RW next time up  Ambulation/Gait                Stairs            Wheelchair Mobility    Modified Rankin (Stroke Patients Only)       Balance Overall balance assessment: Needs assistance         Standing balance support: Bilateral upper extremity supported Standing balance-Leahy Scale: Poor Standing balance comment: requires Bil UE support                             Pertinent Vitals/Pain Pain Assessment: Faces Faces Pain Scale: Hurts little more Pain Location: R knee Pain Descriptors / Indicators: Aching;Sore Pain Intervention(s): Limited activity within patient's tolerance;Repositioned;Monitored during session;Ice applied;Premedicated before session    Home Living Family/patient expects to be discharged to:: Private residence Living Arrangements: Spouse/significant other   Type of Home: Mobile home Home Access: Stairs to enter Entrance Stairs-Rails: Left Entrance Stairs-Number of Steps: Jay: Council - 2 wheels;Walker - 4 wheels;Cane - single point Additional Comments: also states he has lift chair    Prior Function Level of Independence: Independent with assistive device(s)         Comments: using SPC     Hand Dominance        Extremity/Trunk Assessment        Lower Extremity Assessment Lower Extremity Assessment: Generalized weakness;RLE deficits/detail RLE  Deficits / Details: maintained KI, unable to perform leg lift, R LE required assist for movement RLE Sensation: history of peripheral neuropathy       Communication   Communication: No difficulties  Cognition Arousal/Alertness: Awake/alert Behavior During Therapy: Flat affect Overall Cognitive Status: Within Functional Limits for tasks assessed                                        General Comments      Exercises      Assessment/Plan    PT Assessment Patient needs continued PT services  PT Problem List Decreased strength;Decreased mobility;Decreased knowledge of use of DME;Pain;Decreased knowledge of precautions;Decreased safety awareness       PT Treatment Interventions Stair training;Gait training;Patient/family education;Therapeutic activities;Therapeutic exercise;DME instruction;Functional mobility training    PT Goals (Current goals can be found in the Care Plan section)  Acute Rehab PT Goals PT Goal Formulation: With patient Time For Goal Achievement: 12/30/17 Potential to Achieve Goals: Good    Frequency 7X/week   Barriers to discharge        Co-evaluation               AM-PAC PT "6 Clicks" Mobility  Outcome Measure Help needed turning from your back to your side while in a flat bed without using bedrails?: A Little Help needed moving from lying on your back to sitting on the side of a flat bed without using bedrails?: A Little Help needed moving to and from a bed to a chair (including a wheelchair)?: A Little Help needed standing up from a chair using your arms (e.g., wheelchair or bedside chair)?: A Little Help needed to walk in hospital room?: A Lot Help needed climbing 3-5 steps with a railing? : Total 6 Click Score: 15    End of Session Equipment Utilized During Treatment: Gait belt;Right knee immobilizer Activity Tolerance: Patient tolerated treatment well Patient left: in chair;with chair alarm set;with call bell/phone within reach   PT Visit Diagnosis: Difficulty in walking, not elsewhere classified (R26.2)    Time: 0086-7619 PT Time Calculation (min) (ACUTE ONLY): 19 min   Charges:   PT Evaluation $PT Eval Low Complexity: [place], PT, DPT Acute Rehabilitation Services Office: [phone] Pager: [phone]  Trena Platt 12/23/2017, 11:56 AM

## 2017-12-23 NOTE — Progress Notes (Signed)
[name] Coordinator will follow pt with ID and Ortho teams to support Home Infusion Pharmacy services for home IV ABX if ordered at DC to home.  If patient discharges after hours, please call [phone].   Marc [name] 12/23/2017, 8:27 AM

## 2017-12-23 NOTE — Consult Note (Addendum)
Date of Admission:  12/22/2017          Reason for Consult: Prosthetic knee infection Referring Provider:  Dr. Maureen Ralphs   Assessment:  1. Start daptomycin given CKD and discontinue cefazolin 2. Obtain culture data from [facility] aspirate of the knee  Plan:  1. 6 weeks of IV antibiotics with follow-up in infectious disease clinic  2. Consideration of following this with further oral antibiotics though hopefully with resection arthroplasty we would have gained chance to cure this not need suppressive antibiotics 3. Repeat sed rate and C-reactive protein tomorrow postoperatively  Principal Problem:   Septic arthritis of knee, right (HCC)   Scheduled Meds: . acetaminophen  1,000 mg Oral Q6H  . allopurinol  300 mg Oral BID  . aspirin  325 mg Oral Daily  . atorvastatin  10 mg Oral Daily  . docusate sodium  100 mg Oral BID  . ferrous sulfate  325 mg Oral Q breakfast  . furosemide  80 mg Oral Daily  . gabapentin  600 mg Oral QHS  . glimepiride  4 mg Oral Q breakfast  . insulin aspart  0-15 Units Subcutaneous TID WC  . polyethylene glycol  17 g Oral QHS  . sodium chloride flush  10-40 mL Intracatheter Q12H   Continuous Infusions: . sodium chloride 20 mL/hr at 12/23/17 1302  .  ceFAZolin (ANCEF) IV    . methocarbamol (ROBAXIN) IV Stopped (12/22/17 1834)   PRN Meds:.bisacodyl, diphenhydrAMINE, glimepiride, indomethacin, menthol-cetylpyridinium **OR** phenol, methocarbamol **OR** methocarbamol (ROBAXIN) IV, metoCLOPramide **OR** metoCLOPramide (REGLAN) injection, morphine injection, ondansetron **OR** ondansetron (ZOFRAN) IV, oxyCODONE, oxyCODONE, pantoprazole, polyethylene glycol, sodium chloride flush, sodium phosphate  HPI: Marc [name] is a 77 y.o. male followed previously by my partner Dr. Linus Salmons but not for the last 2 years.  He has known type 2 DM, severe PVD, CKD3, CAD, smoker who has hx of right TKA 2015,had vascular surgery of right femoral to tibial  peroneal trunk bypass with vein graft, endarectomy of tibial peroneal trunk with vein patch angioplasty and first ray amputation and placement of wound vac for right great toe dry gangrene on 1/10. He had post operative swelling of right leg and poor wound healing of right foot wound thus on 2/21 underwent I x D, plus 2nd toe ray amputation and wound vac placement. OR cultures grew MRSA and he was discharged on doxycycline for 2 wk which he completed in early March. While off of abtx, he started to have swelling , erythema, and pain to right prosthetic knee join starting on 3/12, he went to Dr. [name] for evaluation as an outpatient and underwent arthrocentesis which was concerning for infection given cloudy fluid. (unclear of cell coutn and culture results). Initially discussed 2 staged revision though the anastomosis of bypass is near his knee that it would likely be compromised if doing 2 staged revision. He was admitted on 3/17 for I x D, exchange of tibial bearing components. OR note suggests early infection,as does clinical history.  He was started on vancomycin and ceftriaxone for projected 6 weeks through 4/27.  Culture remained negative.  Initial CRP of 12 and ESR of 119.  Since then his ESR has reduced. Dr. Linus Salmons had him on plan of indefinite doxycycline.  He presented in December 15, 2017 with complaints of increased pain and swelling and redness of the medial knee.  He was aspirated and sent for Gram stain and culture.  He was placed on Keflex 500 mill grams  3 times daily in addition to his doxycycline.  He was seen in follow-up on the 19th with Dr. Reynaldo Minium.  CRP was 33.6 and sed rate was 65.  Decision was made to take him to the OR for total knee arthrodesis plasty resection and placement of an antibiotic spacer.  Patient was taken to the OR on the 26 and underwent resection of right knee arthroscopic plasty and placement of antibiotic spacer.  Purulent fluid was sent and was collected for culture.  Been  on cefazolin.  I do not know the results of the cultures from the office at [facility].  I will add vancomycin since he has a prior history of MRSA infection and discontinue his cefazolin.      Review of Systems: Review of Systems  Constitutional: Negative for chills, diaphoresis, fever, malaise/fatigue and weight loss.  HENT: Negative for congestion, hearing loss, sore throat and tinnitus.   Eyes: Negative for blurred vision and double vision.  Respiratory: Negative for cough, sputum production, shortness of breath and wheezing.   Cardiovascular: Negative for chest pain, palpitations and leg swelling.  Gastrointestinal: Negative for abdominal pain, blood in stool, constipation, diarrhea, heartburn, melena, nausea and vomiting.  Genitourinary: Negative for dysuria, flank pain and hematuria.  Musculoskeletal: Positive for joint pain and myalgias. Negative for back pain and falls.  Skin: Negative for itching and rash.  Neurological: Negative for dizziness, sensory change, focal weakness, loss of consciousness, weakness and headaches.  Endo/Heme/Allergies: Does not bruise/bleed easily.  Psychiatric/Behavioral: Positive for depression. Negative for memory loss and suicidal ideas. The patient is nervous/anxious.    Amputation site on his right foot is clean  Past Medical History:  Diagnosis Date  . Anticoagulant long-term use    plavix  . Bradycardia   . Chronic constipation   . Chronic pain of right knee   . CKD (chronic kidney disease), stage III ([place])   . Depression   . Diastolic CHF ([place])   . DOE (dyspnea on exertion)   . GERD (gastroesophageal reflux disease)   . Gout   . History of bleeding peptic ulcer   . History of colon polyps   . History of GI bleed 2007   upper gi bleed from peptic ulcer,  required transfusions  . Hyperlipidemia   . Hypertension   . IDA (iron deficiency anemia)   . 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 Insomnia   . Neuropathy   . OA (osteoarthritis)    knees, back  . PAD (peripheral artery disease) [facility]) vascular-- dr Scot Dock   01/ 2017  s/p  right femoral peroneal bypass/ amputation right 1st and 2nd toes;    01/ 2014  s/p  stenting to left common/ external iliac   . 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 Thoracic ascending aortic aneurysm [facility])    followed by dr gerhardt-- last chest CT , stable at 4.5cm  . Tobacco abuse   . Type II diabetes mellitus ([place])    followed by pcp    Social History   Tobacco Use  . Smoking status: Current Every Day Smoker    Packs/day: 1.50    Years: 64.00    Pack years: 96.00    Types: Cigarettes  . Smokeless tobacco: Former Systems developer    Types: [place] date: 12/19/2007  . Tobacco comment: since age 36  Substance Use Topics  . Alcohol use: No    Alcohol/week: 0.0 standard drinks  . Drug use: Never    Family History  Problem Relation Age of Onset  . Colon cancer Paternal Uncle        Uncle  . Heart disease Father   . Hypertension Father   . Heart attack Father   . Heart disease Mother   . Diabetes Mother   . Hypertension Mother    Allergies  Allergen Reactions  . Actos [Pioglitazone] Swelling and Other (See Comments)    LOWER EXTREMITY EDEMA     OBJECTIVE: Blood pressure 121/67, pulse 69, temperature 98.4 F (36.9 C), temperature source Oral, resp. rate 12, height 6' 1"  (1.854 m), weight 68 kg, SpO2 98 %.  Physical Exam  Constitutional: He appears well-developed and well-nourished. He is cooperative. He does not appear ill. No distress.  HENT:  Head: Normocephalic and atraumatic.  Right Ear: Hearing and external ear normal.  Left Ear: Hearing and external ear normal.  Nose: No rhinorrhea or nasal deformity. No epistaxis.  Eyes: Pupils are equal, round, and reactive to light. Conjunctivae and EOM are normal. Right conjunctiva is  not injected. Left conjunctiva is not injected. No scleral icterus.  Neck: Normal range of motion. Neck supple. No JVD present.  Cardiovascular: Normal rate, regular rhythm, S1 normal, S2 normal and normal heart sounds. Exam reveals no friction rub.  No murmur heard. Abdominal: Soft. Normal appearance and bowel sounds are normal. He exhibits no distension and no ascites. There is no hepatosplenomegaly. There is no tenderness.  Musculoskeletal:       Right shoulder: Normal.       Left shoulder: Normal.       Right hip: Normal.       Left hip: Normal.       Right knee: Normal.       Left knee: Normal.  Lymphadenopathy:       Head (right side): No submandibular, no preauricular and no posterior auricular adenopathy present.       Head (left side): No submandibular, no preauricular and no posterior auricular adenopathy present.    He has no cervical adenopathy.       Right cervical: No superficial cervical and no deep cervical adenopathy present.      Left cervical: No superficial cervical and no deep cervical adenopathy present.  Neurological: He is alert. He has normal strength. Gait normal.  Skin: Skin is dry and intact. No abrasion, no bruising, no ecchymosis, no lesion and no rash noted. He is not diaphoretic. No cyanosis or erythema. No pallor. Nails show no clubbing.  Psychiatric: His speech is normal. Judgment and thought content normal. His affect is blunt. He is agitated. Cognition and memory are normal. He is attentive.   Right knee in bandage  Lab Results Lab Results  Component Value Date   WBC 8.0 12/23/2017   HGB 10.9 (L) 12/23/2017   HCT 35.3 (L) 12/23/2017   MCV 102.6 (H) 12/23/2017   PLT 228 12/23/2017    Lab Results  Component Value Date   CREATININE 1.91 (H) 12/23/2017   BUN 47 (H) 12/23/2017   NA 138 12/23/2017   K 5.1 12/23/2017   CL 103 12/23/2017   CO2 23 12/23/2017    Lab Results  Component Value Date   ALT 7 12/22/2017   AST 18 12/22/2017   ALKPHOS  118 12/22/2017   BILITOT 0.6 12/22/2017     Microbiology: Recent Results (from the  past 240 hour(s))  Surgical pcr screen     Status: None   Collection Time: 12/18/17 11:21 AM  Result Value Ref Range Status   MRSA, PCR NEGATIVE NEGATIVE Final   Staphylococcus aureus NEGATIVE NEGATIVE Final    Comment: (NOTE) The Xpert SA Assay (FDA approved for NASAL specimens in patients 14 years of age and older), is one component of a comprehensive surveillance program. It is not intended to diagnose infection nor to guide or monitor treatment. Performed at [facility], [place] [address] [place], [place] [address]   Aerobic/Anaerobic Culture (surgical/deep wound)     Status: None (Preliminary result)   Collection Time: 12/22/17  4:47 PM  Result Value Ref Range Status   Specimen Description KNEE RIGHT  Final   Special Requests NONE  Final   Gram Stain   Final    MODERATE WBC PRESENT, PREDOMINANTLY PMN NO ORGANISMS SEEN Gram Stain Report Called to,Read Back By and Verified With: B.YARBOROUGH AT 2153 ON 12/22/17 BY N.THOMPSON Performed at [facility], [place] [address] [place], [place] [address]    Culture PENDING  Incomplete   Report Status PENDING  Incomplete    Alcide Evener, [place] [facility] [place] [facility] [phone] pager  12/23/2017, 5:37 PM

## 2017-12-23 NOTE — Progress Notes (Signed)
Physical Therapy Treatment Patient Details Name: Marc [name] MRN: [id number] DOB: 08-Jan-1941 Today's Date: 12/23/2017    History of Present Illness Pt is a 77 year old male s/p right knee resection arthroplasty and antibiotic spacer placement with hx of CAD, PAD, CHF, DMII, multiple back surgeries, and R first ray amputation.    PT Comments    Pt requesting assist back to bed.  Deferred ambulation at this time as pt reports increased pain (RN notified).  Pt with improved mobility despite pain reported using RW and states he has RW at home.  Pt with likely decreased mobility prior to surgery as he reports "being down" a long time and using lift chair at home.  Pt does have a few steps to enter home and aware that he will need to practice prior to d/c for safety.   Follow Up Recommendations  Follow surgeon's recommendation for DC plan and follow-up therapies     Equipment Recommendations  None recommended by PT    Recommendations for Other Services       Precautions / Restrictions Precautions Precautions: Fall Precaution Comments: spacer - limited knee flexion Required Braces or Orthoses: Knee Immobilizer - Right Restrictions Other Position/Activity Restrictions: WBAT    Mobility  Bed Mobility Overal bed mobility: Needs Assistance Bed Mobility: Sit to Supine       Sit to supine: Min assist   General bed mobility comments: verbal cues for self assist, assist for R LE  Transfers Overall transfer level: Needs assistance Equipment used: Rolling walker (2 wheeled) Transfers: Sit to/from [facility] Sit to Stand: Min assist Stand pivot transfers: Min guard       General transfer comment: verbal cues for UE and LE positioning, assist to rise, pt reports pain however wished for assist back to bed (RN notified)  Ambulation/Gait                 Stairs             Wheelchair Mobility    Modified Rankin (Stroke Patients Only)        Balance                                            Cognition Arousal/Alertness: Awake/alert Behavior During Therapy: WFL for tasks assessed/performed Overall Cognitive Status: Within Functional Limits for tasks assessed                                        Exercises      General Comments        Pertinent Vitals/Pain Pain Assessment: Faces Faces Pain Scale: Hurts even more Pain Location: R knee Pain Descriptors / Indicators: Aching;Sore Pain Intervention(s): Limited activity within patient's tolerance;Repositioned;Monitored during session;Patient requesting pain meds-RN notified    Home Living                      Prior Function            PT Goals (current goals can now be found in the care plan section) Progress towards PT goals: Progressing toward goals    Frequency    7X/week      PT Plan Current plan remains appropriate    Co-evaluation  AM-PAC PT "6 Clicks" Mobility   Outcome Measure  Help needed turning from your back to your side while in a flat bed without using bedrails?: A Little Help needed moving from lying on your back to sitting on the side of a flat bed without using bedrails?: A Little Help needed moving to and from a bed to a chair (including a wheelchair)?: A Little Help needed standing up from a chair using your arms (e.g., wheelchair or bedside chair)?: A Little Help needed to walk in hospital room?: A Lot Help needed climbing 3-5 steps with a railing? : A Lot 6 Click Score: 16    End of Session Equipment Utilized During Treatment: Gait belt;Right knee immobilizer Activity Tolerance: Patient tolerated treatment well Patient left: with call bell/phone within reach;in bed;with bed alarm set   PT Visit Diagnosis: Difficulty in walking, not elsewhere classified (R26.2)     Time: 2130-8657 PT Time Calculation (min) (ACUTE ONLY): 13 min  Charges:  $Therapeutic Activity:  8-22 mins                    Carmelia Bake, PT, DPT Acute Rehabilitation Services Office: [phone] Pager: [phone]  Trena Platt 12/23/2017, 4:30 PM

## 2017-12-23 NOTE — Progress Notes (Signed)
Inpatient Diabetes Program Recommendations  AACE/ADA: New Consensus Statement on Inpatient Glycemic Control (2015)  Target Ranges:  Prepandial:   less than 140 mg/dL      Peak postprandial:   less than 180 mg/dL (1-2 hours)      Critically ill patients:  140 - 180 mg/dL    Results for Marc [name], Marc [name] (MRN [id number]) as of 12/23/2017 10:00  Ref. Range 12/22/2017 14:08 12/22/2017 18:13 12/23/2017 08:57  Glucose-Capillary Latest Ref Range: 70 - 99 mg/dL 238 (H) 116 (H) 223 (H)   Results for Marc [name], Marc [name] (MRN [id number]) as of 12/23/2017 10:00  Ref. Range 12/18/2017 11:21  Hemoglobin A1C Latest Ref Range: 4.8 - 5.6 % 7.5 (H)    Admit with: Right knee resection arthroplasty and antibiotic spacer placement.  History: DM, CHF  Home DM Meds: Amaryl 4 mg BID (only takes PM dose of CBG >150 mg/dl)  Current Orders: Amaryl 4 mg Daily     Patient received 8 mg Decadron yesterday at 5pm.  Received another dose of Decadron 10 mg today at 10am.    MD- Please consider placing orders for Novolog Sensitive Correction Scale/ SSI (0-9 units) TID AC + HS as well during hospitalization     --Will follow patient during hospitalization--  Wyn Quaker RN, MSN, CDE Diabetes Coordinator Inpatient Glycemic Control Team Team Pager: [phone] (8a-5p)

## 2017-12-23 NOTE — Op Note (Signed)
NAME: Marc, [name] MEDICAL RECORD LZ:[id number] ACCOUNT [id number] DATE OF BIRTH:[birth year] FACILITY: [facility] LOCATION: [facility] PHYSICIAN:Isacc Turney Zella Ball, MD  OPERATIVE REPORT  DATE OF PROCEDURE:  12/22/2017  PREOPERATIVE DIAGNOSIS:  Infected right total knee arthroplasty.  POSTOPERATIVE DIAGNOSIS:  Infected right total knee arthroplasty.  PROCEDURE:  Right knee resection arthroplasty and antibiotic spacer placement.  SURGEON:  Gaynelle Arabian, MD  ASSISTANT:  Theresa Duty, PA-C  ANESTHESIA:  Spinal.  ESTIMATED BLOOD LOSS:  50 mL.  DRAINS:  Hemovac x1.  COMPLICATIONS:  None.  CONDITION:  Stable to recovery.  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  He presented to my office last week with a large bullous area of swelling medial to his knee.  He  was aspirated.  Purulent fluid was identified.  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  It was decided that the only thing to resolve this would be a resection arthroplasty and antibiotic spacer.  He presents  today for that procedure.  PROCEDURE IN DETAIL:  After successful administration of spinal.  The patient was placed on the operating table.  Tourniquet placed around the right thigh.  Right lower extremity was isolated from his perineum with plastic drapes and prepped and draped  in the usual sterile fashion.  Extremity was wrapped in Esmarch, tourniquet inflated to 300 mmHg.  Midline incision was made with a 10 blade through subcutaneous tissue to the level of the extensor mechanism.  Medial arthrotomy was performed and there is  some fluid in the joint, which was sent for stat Gram stain culture and sensitivity.  The arthrotomy was completed and the soft tissue over the proximal medial tibia subperiosteally elevated the joint line with a knife and into the  semimembranosus bursa  with a Cobb elevator.  There was evidence of the abscess present just medial to this.  I entered the abscess cavity thoroughly debrided the tissue to back to normal appearing tissue and removed the liner of the abscess.  We then removed the tibial  polyethylene from the tibial tray.  Circumferential retractors were placed around the tibia and the interface between the tibial component and bone was disrupted with oscillating saw and tibial component removed with minimal to no bone loss.  We then  freshened the edges of the resection with an oscillating saw through the extramedullary tibial alignment guide.  We then addressed the femur.  Using osteotomes, the interface between the femoral component and bone was disrupted.  The femoral component was removed with essentially no bone loss.  The cement was removed.  Both the tibial and femoral canals were then  thoroughly irrigated and any tissue within the canal that is not bony is removed.  We did not need to make any new cuts on the femoral side for placement of the articulating spacer.  A trial femur and then a trial liner were placed and 17.5 was the  appropriate thickness.  We thoroughly debrided the joint further of all synovium and then irrigated with 3 liters of saline using pulsatile lavage.  Three batches of gentamicin impregnated cement was then mixed and the cut bone surfaces were prepared  with pulsatile lavage.  A 17.5 mm insert was then cemented into the tibia for the size 5 femur.  This was a Sigma size 5 femur.  Size 5 posterior stabilized femoral component was then cemented  onto the femur.  The knee was held in full extension.  We had  excellent stability through full range of motion.  There was some popping with flexion, but I was able to get him to flex past 90 degrees and everything remained stable.  While the cement was hardening, I everted the patella and resected the patellar  component with an oscillating saw.  We  then removed the cement and the pegs from the patella.  We further irrigated with pulsatile lavage with saline.  Then 20 mL of Exparel with 60 mL of saline were injected into the extensor mechanism periosteum of the  femur subcutaneous tissues.  The arthrotomy was then closed over a Hemovac drain with a running 0 Stratafix suture.  I was able to flex to 90 degrees.  Topical TXA was injected into the joint and then the tourniquet was released for a total time of 37  minutes.  Subcutaneous was then closed with interrupted 2-0 Vicryl and skin closed with staples.  Incision was clean and dry and a bulky sterile dressing applied.  He was placed into a knee immobilizer, awakened and transported to recovery in stable  condition.  Note that a surgical assistant was a medical necessity for this procedure to do it in a safe and expeditious manner.  Surgical assistance was necessary for retraction of vital ligaments and neurovascular structures as well as for proper positioning of  the limb for safe removal of the old implant and safe and accurate placement of the spacer.  TN/NUANCE  D:12/22/2017 T:12/23/2017 JOB:[id number]

## 2017-12-23 NOTE — Progress Notes (Addendum)
Subjective: 1 Day Post-Op Procedure(s) (LRB): right knee resection arthroplasty; antibiotic spacer (Right) Patient reports pain as mild.   Patient seen in rounds by Dr. Wynelle Link. Patient is well, and has had no acute complaints or problems. No issues overnight. Foley catheter removed this AM. Denies chest pain, SOB, or calf pain. We will start therapy today.   Objective: Vital signs in last 24 hours: Temp:  [97.5 F (36.4 C)-98.2 F (36.8 C)] 98.2 F (36.8 C) (11/26 0511) Pulse Rate:  [70-101] 70 (11/26 0511) Resp:  [12-18] 17 (11/26 0511) BP: (97-136)/(58-86) 122/75 (11/26 0511) SpO2:  [96 %-100 %] 100 % (11/26 0511) Weight:  [68 kg] 68 kg (11/25 1408)  Intake/Output from previous day:  Intake/Output Summary (Last 24 hours) at 12/23/2017 0734 Last data filed at 12/23/2017 0600 Gross per 24 hour  Intake 2412.56 ml  Output 1180 ml  Net 1232.56 ml    Labs: Recent Labs    12/22/17 1426 12/23/17 0431  HGB 12.4* 10.9*   Recent Labs    12/22/17 1426 12/23/17 0431  WBC 9.5 8.0  RBC 3.95* 3.44*  HCT 39.0 35.3*  PLT 291 228   Recent Labs    12/22/17 1426 12/23/17 0431  NA 136 138  K 3.4* 5.1  CL 98 103  CO2 26 23  BUN 43* 47*  CREATININE 1.76* 1.91*  GLUCOSE 243* 273*  CALCIUM 8.9 8.6*   Recent Labs    12/22/17 1426  INR 1.01    Exam: General - Patient is Alert and Oriented Extremity - Neurologically intact Neurovascular intact Sensation intact distally Dorsiflexion/Plantar flexion intact Dressing - dressing C/D/I Motor Function - intact, moving foot and toes well on exam.   Past Medical History:  Diagnosis Date  . Anticoagulant long-term use    plavix  . Bradycardia   . Chronic constipation   . Chronic pain of right knee   . CKD (chronic kidney disease), stage III ([place])   . Depression   . Diastolic CHF ([place])   . DOE (dyspnea on exertion)   . GERD (gastroesophageal reflux disease)   . Gout   . History of bleeding peptic ulcer   .  History of colon polyps   . History of GI bleed 2007   upper gi bleed from peptic ulcer,  required transfusions  . Hyperlipidemia   . Hypertension   . IDA (iron deficiency anemia)   . 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 Insomnia   . Neuropathy   . OA (osteoarthritis)    knees, back  . PAD (peripheral artery disease) [facility]) vascular-- dr Scot Dock   01/ 2017  s/p  right femoral peroneal bypass/ amputation right 1st and 2nd toes;    01/ 2014  s/p  stenting to left common/ external iliac   . 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 Thoracic ascending aortic aneurysm [facility])    followed by dr gerhardt-- last chest CT , stable at 4.5cm  . Tobacco abuse   . Type II diabetes mellitus ([place])    followed by pcp    Assessment/Plan: 1 Day Post-Op Procedure(s) (LRB): right knee resection arthroplasty; antibiotic spacer (Right) Principal Problem:   Septic arthritis of knee, right (HCC)  Estimated body mass index is 19.78 kg/m as calculated from the following:   Height as of this encounter: 6\' 1"  (1.854 m).   Weight as  of this encounter: 68 kg. Advance diet Up with therapy  DVT Prophylaxis - Aspirin Weight bearing as tolerated. D/C O2 and pulse ox and try on room air. Hemovac pulled without difficulty, will begin therapy.  Creatinine 1.91, slightly elevated from baseline. Will continue to monitor.  Intraoperative gram-stain showed predominantly PMNs with no organisms seen. Will follow culture.  Place PICC line today. Will get ID consult for long-term antibiotic management.   Disposition will be determined based off patient's progress with therapy.  Theresa Duty, PA-C Orthopedic Surgery 12/23/2017, 7:34 AM

## 2017-12-23 NOTE — Progress Notes (Signed)
Pharmacy Antibiotic Note  Marc [name] is a 77 y.o. male with infected right TKA -- s/p right knee resection arthroplasty and antibiotic spacer placement on 11/26.  To change abx from ancef to daptomycin on 11/26 for prosthetic knee infection.  ID recom. 6 weeks of IV abx.  - scr trending up 1.91 (crcl~30)   Plan: - dapto 6mg /kg q48h - monitor renal funct - CK qWed (first on 11/27) _____________________________________  Height: 6\' 1"  (185.4 cm) Weight: 149 lb 14.6 oz (68 kg) IBW/kg (Calculated) : 79.9  Temp (24hrs), Avg:97.9 F (36.6 C), Min:97.4 F (36.3 C), Max:98.4 F (36.9 C)  Recent Labs  Lab 12/18/17 1121 12/22/17 1426 12/23/17 0431  WBC 10.5 9.5 8.0  CREATININE 1.68* 1.76* 1.91*    Estimated Creatinine Clearance: 30.7 mL/min (A) (by C-G formula based on SCr of 1.91 mg/dL (H)).    Allergies  Allergen Reactions  . Actos [Pioglitazone] Swelling and Other (See Comments)    LOWER EXTREMITY EDEMA     Thank you for allowing pharmacy to be a part of this patient's care.  Lynelle Doctor 12/23/2017 5:56 PM

## 2017-12-23 NOTE — Care Management Note (Signed)
Case Management Note  Patient Details  Name: Marc [name] MRN: [id number] Date of Birth: [birth year]  Subjective/Objective:    Spoke with patient at bedside. He states he plans to d/c home, has done IV abx at home in the past and is comfortable with doing this now. Has used [facility] in the past and would like to use them again.                 Action/Plan: Contacted [facility] and they have accepted, will use [facility] for IV abx. Awaiting ID consult to determine type and duration of IV abx.  Expected Discharge Date:  12/24/17               Expected Discharge Plan:  [place]  In-House Referral:  NA  Discharge planning Services  CM Consult  Post Acute Care Choice:  Home Health Choice offered to:  Patient  DME Arranged:  N/A DME Agency:  NA  HH Arranged:  RN, Disease Management, PT, IV Antibiotics HH Agency:  [facility]  Status of Service:  In process, will continue to follow  If discussed at Long Length of Stay Meetings, dates discussed:    Additional Comments:  Guadalupe Maple, RN 12/23/2017, 3:17 PM

## 2017-12-24 DIAGNOSIS — M00061 Staphylococcal arthritis, right knee: Secondary | ICD-10-CM

## 2017-12-24 DIAGNOSIS — A4902 Methicillin resistant Staphylococcus aureus infection, unspecified site: Secondary | ICD-10-CM

## 2017-12-24 LAB — BASIC METABOLIC PANEL
ANION GAP: 9 (ref 5–15)
BUN: 64 mg/dL — ABNORMAL HIGH (ref 8–23)
CO2: 27 mmol/L (ref 22–32)
Calcium: 8.8 mg/dL — ABNORMAL LOW (ref 8.9–10.3)
Chloride: 103 mmol/L (ref 98–111)
Creatinine, Ser: 1.77 mg/dL — ABNORMAL HIGH (ref 0.61–1.24)
GFR calc non Af Amer: 36 mL/min — ABNORMAL LOW (ref >60)
GFR, EST AFRICAN AMERICAN: 42 mL/min — AB (ref >60)
GLUCOSE: 80 mg/dL (ref 70–99)
POTASSIUM: 3.8 mmol/L (ref 3.5–5.1)
Sodium: 139 mmol/L (ref 135–145)

## 2017-12-24 LAB — CBC
HCT: 30.2 % — ABNORMAL LOW (ref 39.0–52.0)
HEMOGLOBIN: 9.5 g/dL — AB (ref 13.0–17.0)
MCH: 31.5 pg (ref 26.0–34.0)
MCHC: 31.5 g/dL (ref 30.0–36.0)
MCV: 100 fL (ref 80.0–100.0)
NRBC: 0 % (ref 0.0–0.2)
Platelets: 255 10*3/uL (ref 150–400)
RBC: 3.02 MIL/uL — AB (ref 4.22–5.81)
RDW: 15.7 % — ABNORMAL HIGH (ref 11.5–15.5)
WBC: 12.7 10*3/uL — ABNORMAL HIGH (ref 4.0–10.5)

## 2017-12-24 LAB — C-REACTIVE PROTEIN: CRP: 9.3 mg/dL — AB (ref <1.0)

## 2017-12-24 LAB — GLUCOSE, CAPILLARY
GLUCOSE-CAPILLARY: 169 mg/dL — AB (ref 70–99)
Glucose-Capillary: 62 mg/dL — ABNORMAL LOW (ref 70–99)
Glucose-Capillary: 72 mg/dL (ref 70–99)
Glucose-Capillary: 147 mg/dL — ABNORMAL HIGH (ref 70–99)

## 2017-12-24 LAB — CK: CK TOTAL: 22 U/L — AB (ref 49–397)

## 2017-12-24 LAB — SEDIMENTATION RATE: Sed Rate: 74 mm/hr — ABNORMAL HIGH (ref 0–16)

## 2017-12-24 LAB — HIV ANTIBODY (ROUTINE TESTING W REFLEX): HIV SCREEN 4TH GENERATION: NONREACTIVE

## 2017-12-24 MED ORDER — VANCOMYCIN HCL IN DEXTROSE 750-5 MG/150ML-% IV SOLN: 750.0000 mg | INTRAVENOUS | Status: DC

## 2017-12-24 MED ORDER — ASPIRIN 325 MG PO TABS: 325.0000 mg | ORAL_TABLET | Freq: Every day | ORAL | 0 refills | Status: DC

## 2017-12-24 MED ORDER — OXYCODONE HCL 5 MG PO TABS: 5.0000 mg | ORAL_TABLET | Freq: Four times a day (QID) | ORAL | 0 refills | Status: DC | PRN

## 2017-12-24 MED ORDER — QUETIAPINE FUMARATE 25 MG PO TABS
25.0000 mg | ORAL_TABLET | Freq: Every evening | ORAL | Status: DC | PRN
  Administered 2017-12-24: 25 mg via ORAL
  Filled 2017-12-24: qty 1

## 2017-12-24 MED ORDER — METHOCARBAMOL 500 MG PO TABS: 500.0000 mg | ORAL_TABLET | Freq: Four times a day (QID) | ORAL | 0 refills | Status: AC | PRN

## 2017-12-24 MED ORDER — VANCOMYCIN HCL 10 G IV SOLR
1250.0000 mg | Freq: Once | INTRAVENOUS | Status: AC
  Administered 2017-12-24: 1250 mg via INTRAVENOUS
  Filled 2017-12-24: qty 1250

## 2017-12-24 MED ORDER — VANCOMYCIN HCL IN DEXTROSE 750-5 MG/150ML-% IV SOLN: 750.0000 mg | INTRAVENOUS | 0 refills | Status: AC

## 2017-12-24 MED ORDER — SODIUM CHLORIDE 0.9 % IV SOLN: 6.0000 mg/kg | INTRAVENOUS | 0 refills | Status: DC

## 2017-12-24 MED ORDER — SODIUM CHLORIDE 0.9 % IV SOLN: 500.0000 mg | Freq: Every day | INTRAVENOUS | Status: DC

## 2017-12-24 MED ORDER — METHOCARBAMOL 500 MG PO TABS: 500.0000 mg | ORAL_TABLET | Freq: Four times a day (QID) | ORAL | 0 refills | Status: DC | PRN

## 2017-12-24 MED ORDER — ASPIRIN 325 MG PO TABS: 325.0000 mg | ORAL_TABLET | Freq: Every day | ORAL | 0 refills | Status: AC

## 2017-12-24 MED ORDER — OXYCODONE HCL 5 MG PO TABS: 5.0000 mg | ORAL_TABLET | Freq: Four times a day (QID) | ORAL | 0 refills | Status: AC | PRN

## 2017-12-24 NOTE — Progress Notes (Addendum)
Pharmacy Antibiotic Note  Marc [name] is a 77 y.o. male with infected right TKA -- s/p right knee resection arthroplasty and antibiotic spacer placement on 11/26.  To change abx from ancef to daptomycin on 11/26 for prosthetic knee infection.  ID recom. 6 weeks of IV abx.    - scr improving 1.71 (crcl~33). Patient with history of CKD  - afebrile - CK 11/27 = 22  Plan: - - monitor renal function - with history of CKD need to monitory closely and evaluate if MIVF reducing SCr below baseline.   - Await OPAT - F/u cultures of knee  Addendum: received pricing info from [facility] for home daptomycin (~$153/day) and is cost prohibitive.  Most affordable option will be vancomycin.  ID is OK changing to vancomycin but concern for renal fx with CKD  Plan:  Vancomycin 1250mg  IV x 1 today at ~18:00 (24h from daptomycin dose)  then 750mg  IV q24h  Monitor renal function closely - Scr in am  As per d/w ID, will place OPAT orders as plan is for discharge 11/28. Plan 6 weeks of antibiotic  _____________________________________  Height: 6\' 1"  (185.4 cm) Weight: 149 lb 14.6 oz (68 kg) IBW/kg (Calculated) : 79.9  Temp (24hrs), Avg:98 F (36.7 C), Min:97.4 F (36.3 C), Max:98.4 F (36.9 C)  Recent Labs  Lab 12/18/17 1121 12/22/17 1426 12/23/17 0431 12/24/17 0521  WBC 10.5 9.5 8.0 12.7*  CREATININE 1.68* 1.76* 1.91* 1.77*    Estimated Creatinine Clearance: 33.1 mL/min (A) (by C-G formula based on SCr of 1.77 mg/dL (H)).    Allergies  Allergen Reactions  . Actos [Pioglitazone] Swelling and Other (See Comments)    LOWER EXTREMITY EDEMA    Antimicrobials this admission:  11/25 ancef>>11/26 11/26 dapto>> 11/27 11/27 vancomycin   Dose adjustments this admission:   Microbiology results:  11/25 right knee synovial fluid:  Thank you for allowing pharmacy to be a part of this patient's care.  Doreene Eland, PharmD, BCPS.   Work Cell: [phone] 12/24/2017 7:41  AM

## 2017-12-24 NOTE — Progress Notes (Signed)
Diagnosis: Prosthetic knee infection  Culture Result: No growth  Allergies  Allergen Reactions  . Actos [Pioglitazone] Swelling and Other (See Comments)    LOWER EXTREMITY EDEMA     OPAT Orders Discharge antibiotics: Daptomycin per pharmacy versus vancomycin per pharmacy this is still being worked out by the primary team patient insurance  Per pharmacy protocol daptomycin added aggressive dose versus Vancomycin trough 15-20 (unless otherwise indicated) Duration: 6 weeks End Date: January 7    [facility] Care Per Protocol:  Biweekly labs if on vancomycin:  Xx BMP with GFR  Labs weekly while on IV antibiotics: _x_ CBC with differential  _x_ CRP _x_ ESR _x_ Vancomycin trough _x_ CK  _x_ Please pull PIC at completion of IV antibiotics __ Please leave PIC in place until doctor has seen patient or been notified  Fax weekly labs to [phone]  Clinic Follow Up Appt:  First week of January.

## 2017-12-24 NOTE — Progress Notes (Signed)
Physical Therapy Treatment Patient Details Name: Marc [name] MRN: [id number] DOB: 1940-04-03 Today's Date: 12/24/2017    History of Present Illness Pt is a 77 year old male s/p right knee resection arthroplasty and antibiotic spacer placement with hx of CAD, PAD, CHF, DMII, multiple back surgeries, and R first ray amputation.    PT Comments    Pt assisted OOB and ambulated within room however limited mobility due to increased pain. RN notified of request for pain meds and pt provided with ice packs.  Pt reports uncertainty of d/c plan: SNF vs home (likely pending antibiotics?)    Follow Up Recommendations  Follow surgeon's recommendation for DC plan and follow-up therapies     Equipment Recommendations  None recommended by PT    Recommendations for Other Services       Precautions / Restrictions Precautions Precautions: Fall Precaution Comments: spacer - limited knee flexion Required Braces or Orthoses: Knee Immobilizer - Right Restrictions Other Position/Activity Restrictions: WBAT    Mobility  Bed Mobility Overal bed mobility: Needs Assistance Bed Mobility: Sit to Supine     Supine to sit: Min guard;HOB elevated     General bed mobility comments: increased time and effort due to pain  Transfers Overall transfer level: Needs assistance Equipment used: Rolling walker (2 wheeled) Transfers: Sit to/from Stand Sit to Stand: Min guard         General transfer comment: verbal cues for UE and LE positioning, increased time and effort however no physical assist required as pt prefers to perform as independently as possible  Ambulation/Gait Ambulation/Gait assistance: Min guard Gait Distance (Feet): 20 Feet Assistive device: Rolling walker (2 wheeled) Gait Pattern/deviations: Step-to pattern;Decreased stance time - right;Decreased weight shift to right;Antalgic     General Gait Details: increased effort for advancing R LE due to pain, min/guard for safety,  limited distance to within room due to pain; pt reports 14/10   Stairs             Wheelchair Mobility    Modified Rankin (Stroke Patients Only)       Balance                                            Cognition Arousal/Alertness: Awake/alert Behavior During Therapy: WFL for tasks assessed/performed Overall Cognitive Status: Within Functional Limits for tasks assessed                                        Exercises      General Comments        Pertinent Vitals/Pain Pain Assessment: 0-10 Pain Score: 10-Worst pain ever Pain Location: R knee (14/10 with ambulation, 7/10 at rest) Pain Descriptors / Indicators: Aching;Sore Pain Intervention(s): Limited activity within patient's tolerance;Repositioned;Monitored during session;Ice applied;Patient requesting pain meds-RN notified    Home Living                      Prior Function            PT Goals (current goals can now be found in the care plan section) Progress towards PT goals: Progressing toward goals    Frequency    7X/week      PT Plan Current plan remains appropriate    Co-evaluation  AM-PAC PT "6 Clicks" Mobility   Outcome Measure  Help needed turning from your back to your side while in a flat bed without using bedrails?: A Little Help needed moving from lying on your back to sitting on the side of a flat bed without using bedrails?: A Little Help needed moving to and from a bed to a chair (including a wheelchair)?: A Little Help needed standing up from a chair using your arms (e.g., wheelchair or bedside chair)?: A Little Help needed to walk in hospital room?: A Little Help needed climbing 3-5 steps with a railing? : A Lot 6 Click Score: 17    End of Session Equipment Utilized During Treatment: Right knee immobilizer Activity Tolerance: Patient limited by pain Patient left: with call bell/phone within reach;in chair Nurse  Communication: Mobility status;Patient requests pain meds PT Visit Diagnosis: Difficulty in walking, not elsewhere classified (R26.2)     Time: 7353-2992 PT Time Calculation (min) (ACUTE ONLY): 16 min  Charges:  $Gait Training: 8-22 mins                     Carmelia Bake, PT, DPT Acute Rehabilitation Services Office: [phone] Pager: [phone]  Trena Platt 12/24/2017, 1:21 PM

## 2017-12-24 NOTE — Progress Notes (Signed)
PHARMACY CONSULT NOTE FOR:  OUTPATIENT  PARENTERAL ANTIBIOTIC THERAPY (OPAT)  Indication: Prosthetic joint infection - knee Regimen: vancomycin 750mg  IV q24h End date: 02/03/2018  IV antibiotic discharge orders are pended. To discharging provider:  please sign these orders via discharge navigator,  Select New Orders & click on the button choice - Manage This Unsigned Work.     Thank you for allowing pharmacy to be a part of this patient's care.  Doreene Eland, PharmD, BCPS.   Work Cell: [phone] 12/24/2017 12:38 PM

## 2017-12-24 NOTE — Progress Notes (Signed)
Subjective:  The patient is worried that he will ultimately need to have an amputation to cure his infection  Antibiotics:  Anti-infectives (From admission, onward)   Start     Dose/Rate Route Frequency Ordered Stop   12/25/17 2000  DAPTOmycin (CUBICIN) 408 mg in sodium chloride 0.9 % IVPB  Status:  Discontinued     6 mg/kg  68 kg 216.3 mL/hr over 30 Minutes Intravenous Every 48 hours 12/23/17 1806 12/24/17 0733   12/25/17 1800  vancomycin (VANCOCIN) IVPB 750 mg/150 ml premix     750 mg 150 mL/hr over 60 Minutes Intravenous Every 24 hours 12/24/17 1233     12/25/17 0000  DAPTOmycin 408 mg in sodium chloride 0.9 % 100 mL  Status:  Discontinued     6 mg/kg  68 kg 216.3 mL/hr over 30 Minutes Intravenous Every 48 hours 12/24/17 0726 12/24/17    12/24/17 2000  DAPTOmycin (CUBICIN) 500 mg in sodium chloride 0.9 % IVPB  Status:  Discontinued     500 mg 220 mL/hr over 30 Minutes Intravenous Daily 12/24/17 0733 12/24/17 1210   12/24/17 1800  vancomycin (VANCOCIN) 1,250 mg in sodium chloride 0.9 % 250 mL IVPB     1,250 mg 166.7 mL/hr over 90 Minutes Intravenous  Once 12/24/17 1233     12/23/17 2200  ceFAZolin (ANCEF) IVPB 2g/100 mL premix  Status:  Discontinued     2 g 200 mL/hr over 30 Minutes Intravenous Every 12 hours 12/23/17 1515 12/23/17 1745   12/23/17 1815  DAPTOmycin (CUBICIN) 408 mg in sodium chloride 0.9 % IVPB     6 mg/kg  68 kg 216.3 mL/hr over 30 Minutes Intravenous NOW 12/23/17 1806 12/23/17 1923   12/23/17 0600  ceFAZolin (ANCEF) IVPB 2g/100 mL premix     2 g 200 mL/hr over 30 Minutes Intravenous On call to O.R. 12/22/17 1352 12/22/17 1700   12/22/17 2230  ceFAZolin (ANCEF) IVPB 2g/100 mL premix  Status:  Discontinued     2 g 200 mL/hr over 30 Minutes Intravenous Every 6 hours 12/22/17 1910 12/23/17 1515   12/22/17 1428  ceFAZolin (ANCEF) 2-4 GM/100ML-% IVPB  Status:  Discontinued    Note to Pharmacy:  Marchia Meiers   : cabinet override      12/22/17 1428  12/22/17 1436      Medications: Scheduled Meds: . allopurinol  300 mg Oral BID  . aspirin  325 mg Oral Daily  . atorvastatin  10 mg Oral Daily  . docusate sodium  100 mg Oral BID  . ferrous sulfate  325 mg Oral Q breakfast  . furosemide  80 mg Oral Daily  . gabapentin  600 mg Oral QHS  . glimepiride  4 mg Oral Q breakfast  . insulin aspart  0-15 Units Subcutaneous TID WC  . polyethylene glycol  17 g Oral QHS  . sodium chloride flush  10-40 mL Intracatheter Q12H   Continuous Infusions: . sodium chloride 20 mL/hr at 12/23/17 1302  . methocarbamol (ROBAXIN) IV Stopped (12/22/17 1834)  . vancomycin    . [START ON 12/25/2017] vancomycin     PRN Meds:.bisacodyl, diphenhydrAMINE, glimepiride, indomethacin, menthol-cetylpyridinium **OR** phenol, methocarbamol **OR** methocarbamol (ROBAXIN) IV, metoCLOPramide **OR** metoCLOPramide (REGLAN) injection, morphine injection, ondansetron **OR** ondansetron (ZOFRAN) IV, oxyCODONE, oxyCODONE, pantoprazole, polyethylene glycol, sodium chloride flush, sodium phosphate    Objective: Weight change:   Intake/Output Summary (Last 24 hours) at 12/24/2017 1645 Last data filed at 12/24/2017 1621 Gross per 24 hour  Intake 640 ml  Output 1075 ml  Net -435 ml   Blood pressure 133/67, pulse (!) 105, temperature 98.4 F (36.9 C), temperature source Oral, resp. rate 16, height 6\' 1"  (1.854 m), weight 68 kg, SpO2 97 %. Temp:  [97.9 F (36.6 C)-98.4 F (36.9 C)] 98.4 F (36.9 C) (11/27 1346) Pulse Rate:  [56-105] 105 (11/27 1346) Resp:  [16-18] 16 (11/27 1346) BP: (121-141)/(67-80) 133/67 (11/27 1346) SpO2:  [97 %-99 %] 97 % (11/27 1346)  Physical Exam: General: Alert and awake, oriented x3, not in any acute distress. HEENT: anicteric sclera, EOMI CVS regular rate, normal  Chest: , no wheezing, no respiratory distress Abdomen: soft non-distended,  Extremities: In dressing Skin: Couple ecchymoses on arms Neuro:  nonfocal  CBC:    BMET Recent Labs    12/23/17 0431 12/24/17 0521  NA 138 139  K 5.1 3.8  CL 103 103  CO2 23 27  GLUCOSE 273* 80  BUN 47* 64*  CREATININE 1.91* 1.77*  CALCIUM 8.6* 8.8*     Liver Panel  Recent Labs    12/22/17 1426  PROT 7.2  ALBUMIN 3.4*  AST 18  ALT 7  ALKPHOS 118  BILITOT 0.6       Sedimentation Rate Recent Labs    12/24/17 0521  ESRSEDRATE 74*   C-Reactive Protein Recent Labs    12/24/17 0500  CRP 9.3*    Micro Results: Recent Results (from the past 720 hour(s))  Surgical pcr screen     Status: None   Collection Time: 12/18/17 11:21 AM  Result Value Ref Range Status   MRSA, PCR NEGATIVE NEGATIVE Final   Staphylococcus aureus NEGATIVE NEGATIVE Final    Comment: (NOTE) The Xpert SA Assay (FDA approved for NASAL specimens in patients 35 years of age and older), is one component of a comprehensive surveillance program. It is not intended to diagnose infection nor to guide or monitor treatment. Performed at [facility], [place] [address], [place], [place] [address]   Aerobic/Anaerobic Culture (surgical/deep wound)     Status: None (Preliminary result)   Collection Time: 12/22/17  4:47 PM  Result Value Ref Range Status   Specimen Description   Final    KNEE RIGHT Performed at [place] [address] [place], [place] [address]    Special Requests   Final    NONE Performed at [facility], [place] [address], [place], [place] [address]    Gram Stain   Final    MODERATE WBC PRESENT, PREDOMINANTLY PMN NO ORGANISMS SEEN Gram Stain Report Called to,Read Back By and Verified With: [facility] AT 2153 ON 12/22/17 BY N.THOMPSON Performed at [facility], [place] [address], [place], [place] [address]    Culture   Final    NO GROWTH 1 DAY Performed at [facility], [place] [address] [place], [place] [address]    Report Status PENDING  Incomplete     Studies/Results: [place] Ekg Site Rite  Result Date: 12/23/2017 If Site Rite image not attached, placement could not be confirmed due to current cardiac rhythm.     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 male with prior total knee arthroplasty that was  infected and which he was treated with IV antibiotics including myosin and ceftriaxone when no organism was isolated from the joint.  He had been recently treated for an MRSA infection involving an infected toe that was removed by vascular surgery.  After completing a 6 weeks of antibiotics he was maintained on chronic doxycycline but failed this and ultimately underwent resection arthroplasty with placement of an antibiotic spacer by Dr. Maureen Ralphs.  Cultures from Dr. Maureen Ralphs office as well as from the OR here have not yielded an organism to date that we suspect MRSA is the likely culprit  #1 affected right total knee arthroplasty status post resection arthroplasty  Plan on 6 weeks of systemic antibiotics.  Given his chronic kidney disease and home diuretic therapy my preference would be for daptomycin.  However the cost was prohibitive when he would go home with [facility].  Another infusion company can provide the drug for half the cost but I do not know if it still something that is affordable to the patient.  If not he will go home on IV vancomycin with twice weekly metabolic panels and once weekly CBCs and vancomycin trough faxed to our clinic.  May follow this with oral antibiotics mainly due to anxiety there might be some residual infection for example in bone near his prior chronic prosthetic infection.  If the infection was really fine to the prosthesis itself it should be  curable with resection arthroplasty and 6 weeks of antibiotics.   LOS: 2 days   Alcide Evener 12/24/2017, 4:45 PM

## 2017-12-24 NOTE — Plan of Care (Signed)
  Problem: Education: Goal: Knowledge of General Education information will improve Description Including pain rating scale, medication(s)/side effects and non-pharmacologic comfort measures Outcome: Progressing   Problem: Clinical Measurements: Goal: Ability to maintain clinical measurements within normal limits will improve Outcome: Progressing   Problem: Clinical Measurements: Goal: Will remain free from infection Outcome: Progressing   

## 2017-12-24 NOTE — Progress Notes (Addendum)
Subjective: 2 Days Post-Op Procedure(s) (LRB): right knee resection arthroplasty; antibiotic spacer (Right) Patient reports pain as moderate.   Patient seen in rounds for Dr. Wynelle Link. Patient is well, and has had no acute complaints or problems other than pain in the right knee. Voiding without difficulty and positive flatus. No issues overnight. Denies chest pain, SOB, or calf pain. Plan is to go Home after hospital stay.  Objective: Vital signs in last 24 hours: Temp:  [97.4 F (36.3 C)-98.4 F (36.9 C)] 98 F (36.7 C) (11/27 0600) Pulse Rate:  [56-99] 66 (11/27 0600) Resp:  [12-18] 18 (11/27 0600) BP: (121-136)/(66-76) 136/73 (11/27 0600) SpO2:  [96 %-100 %] 99 % (11/27 0600)  Intake/Output from previous day:  Intake/Output Summary (Last 24 hours) at 12/24/2017 0719 Last data filed at 12/24/2017 0500 Gross per 24 hour  Intake 1120.07 ml  Output 950 ml  Net 170.07 ml    Labs: Recent Labs    12/22/17 1426 12/23/17 0431 12/24/17 0521  HGB 12.4* 10.9* 9.5*   Recent Labs    12/23/17 0431 12/24/17 0521  WBC 8.0 12.7*  RBC 3.44* 3.02*  HCT 35.3* 30.2*  PLT 228 255   Recent Labs    12/23/17 0431 12/24/17 0521  NA 138 139  K 5.1 3.8  CL 103 103  CO2 23 27  BUN 47* 64*  CREATININE 1.91* 1.77*  GLUCOSE 273* 80  CALCIUM 8.6* 8.8*   Recent Labs    12/22/17 1426  INR 1.01    Exam: General - Patient is Alert and Oriented Extremity - Neurologically intact Neurovascular intact Sensation intact distally Dorsiflexion/Plantar flexion intact Dressing/Incision - clean, dry, no drainage Motor Function - intact, moving foot and toes well on exam.   Past Medical History:  Diagnosis Date  . Anticoagulant long-term use    plavix  . Bradycardia   . Chronic constipation   . Chronic pain of right knee   . CKD (chronic kidney disease), stage III ([place])   . Depression   . Diastolic CHF ([place])   . DOE (dyspnea on exertion)   . GERD (gastroesophageal reflux  disease)   . Gout   . History of bleeding peptic ulcer   . History of colon polyps   . History of GI bleed 2007   upper gi bleed from peptic ulcer,  required transfusions  . Hyperlipidemia   . Hypertension   . IDA (iron deficiency anemia)   . 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 Insomnia   . Neuropathy   . OA (osteoarthritis)    knees, back  . PAD (peripheral artery disease) [facility]) vascular-- dr Scot Dock   01/ 2017  s/p  right femoral peroneal bypass/ amputation right 1st and 2nd toes;    01/ 2014  s/p  stenting to left common/ external iliac   . 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 Thoracic ascending aortic aneurysm [facility])    followed by dr gerhardt-- last chest CT , stable at 4.5cm  . Tobacco abuse   . Type II diabetes mellitus ([place])    followed by pcp    Assessment/Plan: 2 Days Post-Op Procedure(s) (LRB): right knee resection arthroplasty; antibiotic spacer (Right) Principal Problem:   Septic arthritis of knee, right (HCC)  Estimated body mass index is 19.78 kg/m as calculated from the following:   Height as of this encounter: 6'  1" (1.854 m).   Weight as of this encounter: 68 kg. Up with therapy  DVT Prophylaxis - Aspirin and Plavix Weight-bearing as tolerated  Intra-operative cultures pending. Per ID consult with Dr. Bonnita Hollow, antibiotic regimen will consist of 6 weeks IV Daptomycin.  Plan for discharge to home tomorrow with HHPT. Follow-up in the office in 2 weeks with Dr. Wynelle Link.  Theresa Duty, PA-C Orthopedic Surgery 12/24/2017, 7:19 AM

## 2017-12-24 NOTE — Progress Notes (Signed)
Pt will be discharged on IV vancomycin due to cost of daptomycin with patient's insurance.

## 2017-12-24 NOTE — Progress Notes (Signed)
PT Cancellation Note  Patient Details Name: Marc [name] MRN: [id number] DOB: 1940-04-27   Cancelled Treatment:    Reason Eval/Treat Not Completed: Patient declined, no reason specified Pt declined due to pain finally controlled and resting in bed.  Pt reports frustration over d/c inconsistencies from MDs.   Elmore Hyslop,KATHrine E 12/24/2017, 3:00 PM Carmelia Bake, PT, DPT Acute Rehabilitation Services Office: [phone] Pager: [phone]

## 2017-12-25 LAB — CBC
HCT: 27.6 % — ABNORMAL LOW (ref 39.0–52.0)
HEMOGLOBIN: 8.7 g/dL — AB (ref 13.0–17.0)
MCH: 31.5 pg (ref 26.0–34.0)
MCHC: 31.5 g/dL (ref 30.0–36.0)
MCV: 100 fL (ref 80.0–100.0)
PLATELETS: 216 10*3/uL (ref 150–400)
RBC: 2.76 MIL/uL — ABNORMAL LOW (ref 4.22–5.81)
RDW: 15.9 % — ABNORMAL HIGH (ref 11.5–15.5)
WBC: 8 10*3/uL (ref 4.0–10.5)
nRBC: 0 % (ref 0.0–0.2)

## 2017-12-25 LAB — GLUCOSE, CAPILLARY
Glucose-Capillary: 190 mg/dL — ABNORMAL HIGH (ref 70–99)
Glucose-Capillary: 93 mg/dL (ref 70–99)
Glucose-Capillary: 199 mg/dL — ABNORMAL HIGH (ref 70–99)

## 2017-12-25 LAB — HCV COMMENT:

## 2017-12-25 LAB — CREATININE, SERUM
CREATININE: 1.67 mg/dL — AB (ref 0.61–1.24)
GFR calc Af Amer: 45 mL/min — ABNORMAL LOW (ref >60)
GFR, EST NON AFRICAN AMERICAN: 39 mL/min — AB (ref >60)

## 2017-12-25 LAB — HEPATITIS C ANTIBODY (REFLEX): HCV Ab: <0.1 s/co ratio (ref 0.0–0.9)

## 2017-12-25 MED ORDER — VANCOMYCIN HCL IN DEXTROSE 750-5 MG/150ML-% IV SOLN
750.0000 mg | INTRAVENOUS | Status: DC
  Administered 2017-12-25: 750 mg via INTRAVENOUS
  Filled 2017-12-25 (×2): qty 150

## 2017-12-25 MED ORDER — HEPARIN SOD (PORK) LOCK FLUSH 100 UNIT/ML IV SOLN: 250.0000 [IU] | INTRAVENOUS | Status: DC | PRN

## 2017-12-25 MED ORDER — HEPARIN SOD (PORK) LOCK FLUSH 100 UNIT/ML IV SOLN
500.0000 [IU] | Freq: Once | INTRAVENOUS | Status: AC
  Administered 2017-12-25: 250 [IU] via INTRAVENOUS

## 2017-12-25 NOTE — Progress Notes (Addendum)
Subjective: 3 Days Post-Op Procedure(s) (LRB): right knee resection arthroplasty; antibiotic spacer (Right) Patient reports pain as mild.   He wants to go home today and will discharge as long as he does well enough with PT Plan is to go Home after hospital stay.  Objective: Vital signs in last 24 hours: Temp:  [97.6 F (36.4 C)-98.4 F (36.9 C)] 97.6 F (36.4 C) (11/28 0521) Pulse Rate:  [72-105] 78 (11/28 0521) Resp:  [16] 16 (11/28 0521) BP: (133-146)/(67-80) 146/70 (11/28 0521) SpO2:  [97 %-100 %] 100 % (11/28 0521)  Intake/Output from previous day:  Intake/Output Summary (Last 24 hours) at 12/25/2017 0842 Last data filed at 12/25/2017 4709 Gross per 24 hour  Intake 1557.22 ml  Output 1750 ml  Net -192.78 ml    Intake/Output this shift: No intake/output data recorded.  Labs: Recent Labs    12/22/17 1426 12/23/17 0431 12/24/17 0521 12/25/17 0426  HGB 12.4* 10.9* 9.5* 8.7*   Recent Labs    12/24/17 0521 12/25/17 0426  WBC 12.7* 8.0  RBC 3.02* 2.76*  HCT 30.2* 27.6*  PLT 255 216   Recent Labs    12/23/17 0431 12/24/17 0521 12/25/17 0426  NA 138 139  --   K 5.1 3.8  --   CL 103 103  --   CO2 23 27  --   BUN 47* 64*  --   CREATININE 1.91* 1.77* 1.67*  GLUCOSE 273* 80  --   CALCIUM 8.6* 8.8*  --    Recent Labs    12/22/17 1426  INR 1.01    EXAM General - Patient is Alert, Appropriate and Oriented Extremity - Neurovascular intact Dorsiflexion/Plantar flexion intact Incision: dressing C/D/I No cellulitis present Compartment soft Dressing/Incision - clean, dry, no drainage Motor Function - intact, moving foot and toes well on exam.   Past Medical History:  Diagnosis Date  . Anticoagulant long-term use    plavix  . Bradycardia   . Chronic constipation   . Chronic pain of right knee   . CKD (chronic kidney disease), stage III ([place])   . Depression   . Diastolic CHF ([place])   . DOE (dyspnea on exertion)   . GERD (gastroesophageal  reflux disease)   . Gout   . History of bleeding peptic ulcer   . History of colon polyps   . History of GI bleed 2007   upper gi bleed from peptic ulcer,  required transfusions  . Hyperlipidemia   . Hypertension   . IDA (iron deficiency anemia)   . Infection of total right knee replacement ([place]) 11/2017   previous has had right TKA infeciton 04-14-2015  s/p  resection arthroplasty with antiobiotic spacer  . Insomnia   . Neuropathy   . OA (osteoarthritis)    knees, back  . PAD (peripheral artery disease) [facility]) vascular-- dr Scot Dock   01/ 2017  s/p  right femoral peroneal bypass/ amputation right 1st and 2nd toes;    01/ 2014  s/p  stenting to left common/ external iliac   . 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 Thoracic ascending aortic aneurysm [facility])    followed by dr gerhardt-- last chest CT , stable at 4.5cm  . Tobacco abuse   . Type II diabetes mellitus ([place])    followed by pcp    Assessment/Plan: 3 Days Post-Op Procedure(s) (LRB): right knee resection arthroplasty; antibiotic spacer (Right) Principal Problem:   Septic arthritis of knee, right (HCC)   Up with therapy  Discharge home with home health  Weight-Bearing as tolerated to right leg  Gaynelle Arabian 12/25/2017, 8:42 AM

## 2017-12-25 NOTE — Progress Notes (Addendum)
Physical Therapy Treatment Patient Details Name: Marc [name] MRN: [id number] DOB: 01-17-1941 Today's Date: 12/25/2017    History of Present Illness Pt is a 77 year old male s/p right knee resection arthroplasty and antibiotic spacer placement with hx of CAD, PAD, CHF, DMII, multiple back surgeries, and R first ray amputation.    PT Comments    Pt progressing gradually with mobility, tolerated increased distance of 57' with RW and completed stair training. Pain limiting activity tolerance.   Follow Up Recommendations  Follow surgeon's recommendation for DC plan and follow-up therapies; HHPT     Equipment Recommendations  None recommended by PT    Recommendations for Other Services       Precautions / Restrictions Precautions Precautions: Fall Precaution Comments: spacer - limited knee flexion Required Braces or Orthoses: Knee Immobilizer - Right Restrictions Other Position/Activity Restrictions: WBAT    Mobility  Bed Mobility Overal bed mobility: Needs Assistance Bed Mobility: Supine to Sit     Supine to sit: HOB elevated;Min guard     General bed mobility comments: increased time and effort due to pain, min/guard for RLE  Transfers Overall transfer level: Needs assistance Equipment used: Rolling walker (2 wheeled) Transfers: Sit to/from Stand Sit to Stand: Min guard         General transfer comment: verbal cues for UE and LE positioning, increased time and effort however no physical assist required as pt prefers to perform as independently as possible  Ambulation/Gait Ambulation/Gait assistance: Min guard Gait Distance (Feet): 38 Feet Assistive device: Rolling walker (2 wheeled) Gait Pattern/deviations: Step-to pattern;Decreased stance time - right;Decreased weight shift to right;Antalgic Gait velocity: decr   General Gait Details: increased effort for advancing R LE due to pain, min/guard for safety, limited distance to within room due to  pain   Stairs Stairs: Yes Stairs assistance: Min guard Stair Management: One rail Left;Step to pattern;With cane;Forwards Number of Stairs: 2 General stair comments: VCs sequencing   Wheelchair Mobility    Modified Rankin (Stroke Patients Only)       Balance Overall balance assessment: Needs assistance         Standing balance support: Bilateral upper extremity supported Standing balance-Leahy Scale: Poor Standing balance comment: requires Bil UE support                            Cognition Arousal/Alertness: Awake/alert Behavior During Therapy: WFL for tasks assessed/performed Overall Cognitive Status: Within Functional Limits for tasks assessed                                        Exercises Total Joint Exercises Ankle Circles/Pumps: AROM;Both;10 reps;Supine    General Comments        Pertinent Vitals/Pain Pain Score: 7  Pain Location: R knee Pain Descriptors / Indicators: Aching;Sore Pain Intervention(s): Limited activity within patient's tolerance;Monitored during session;Premedicated before session;Ice applied    Home Living                      Prior Function            PT Goals (current goals can now be found in the care plan section) Acute Rehab PT Goals PT Goal Formulation: With patient Time For Goal Achievement: 12/30/17 Potential to Achieve Goals: Good Progress towards PT goals: Progressing toward goals    Frequency  7X/week      PT Plan Current plan remains appropriate    Co-evaluation              AM-PAC PT "6 Clicks" Mobility   Outcome Measure  Help needed turning from your back to your side while in a flat bed without using bedrails?: A Little Help needed moving from lying on your back to sitting on the side of a flat bed without using bedrails?: A Little Help needed moving to and from a bed to a chair (including a wheelchair)?: A Little Help needed standing up from a chair  using your arms (e.g., wheelchair or bedside chair)?: A Little Help needed to walk in hospital room?: A Little Help needed climbing 3-5 steps with a railing? : A Little 6 Click Score: 18    End of Session Equipment Utilized During Treatment: Right knee immobilizer;Gait belt Activity Tolerance: Patient limited by pain Patient left: with call bell/phone within reach;in chair Nurse Communication: Mobility status;Patient requests pain meds PT Visit Diagnosis: Difficulty in walking, not elsewhere classified (R26.2)     Time: 0823-0900 PT Time Calculation (min) (ACUTE ONLY): 37 min  Charges:  $Gait Training: 8-22 mins $Therapeutic Activity: 8-22 mins                     Blondell Reveal Kistler PT 12/25/2017  Acute Rehabilitation Services Pager [phone] Office [phone]

## 2017-12-28 LAB — AEROBIC/ANAEROBIC CULTURE (SURGICAL/DEEP WOUND)

## 2017-12-28 LAB — AEROBIC/ANAEROBIC CULTURE W GRAM STAIN (SURGICAL/DEEP WOUND): Culture: NO GROWTH

## 2017-12-29 NOTE — Discharge Summary (Signed)
Physician Discharge Summary   Patient ID: Marc [name] MRN: [id number] DOB/AGE: 1940/05/20 77 y.o.  Admit date: 12/22/2017 Discharge date: 12/25/2017  Primary Diagnosis: Infected right total knee arthroplasty   Admission Diagnoses:  Past Medical History:  Diagnosis Date  . Anticoagulant long-term use    plavix  . Bradycardia   . Chronic constipation   . Chronic pain of right knee   . CKD (chronic kidney disease), stage III ([place])   . Depression   . Diastolic CHF ([place])   . DOE (dyspnea on exertion)   . GERD (gastroesophageal reflux disease)   . Gout   . History of bleeding peptic ulcer   . History of colon polyps   . History of GI bleed 2007   upper gi bleed from peptic ulcer,  required transfusions  . Hyperlipidemia   . Hypertension   . IDA (iron deficiency anemia)   . 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 Insomnia   . Neuropathy   . OA (osteoarthritis)    knees, back  . PAD (peripheral artery disease) [facility]) vascular-- dr Scot Dock   01/ 2017  s/p  right femoral peroneal bypass/ amputation right 1st and 2nd toes;    01/ 2014  s/p  stenting to left common/ external iliac   . 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 Thoracic ascending aortic aneurysm [facility])    followed by dr gerhardt-- last chest CT , stable at 4.5cm  . Tobacco abuse   . Type II diabetes mellitus ([place])    followed by pcp   Discharge Diagnoses:   Principal Problem:   Septic arthritis of knee, right ([place])  Estimated body mass index is 19.78 kg/m as calculated from the following:   Height as of this encounter: 6' 1"  (1.854 m).   Weight as of this encounter: 68 kg.  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  He presented to my office last week with a large bullous area of swelling medial to his knee.  He was aspirated.  Purulent fluid was identified.  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  It was decided that the only thing to resolve this would be a resection arthroplasty and antibiotic spacer.  He presents today for that procedure.  Laboratory Data: Admission on 12/22/2017, Discharged on 12/25/2017  Component Date Value Ref Range Status  . WBC 12/22/2017 9.5  4.0 - 10.5 K/uL Final  . RBC 12/22/2017 3.95* 4.22 - 5.81 MIL/uL Final  . Hemoglobin 12/22/2017 12.4* 13.0 - 17.0 g/dL Final  . HCT 12/22/2017 39.0  39.0 - 52.0 % Final  . MCV 12/22/2017 98.7  80.0 - 100.0 fL Final  . MCH 12/22/2017 31.4  26.0 - 34.0 pg Final  . MCHC 12/22/2017 31.8  30.0 - 36.0 g/dL Final  . RDW 12/22/2017 15.9* 11.5 - 15.5 % Final  . Platelets 12/22/2017 291  150 - 400 K/uL Final  . nRBC 12/22/2017 0.0  0.0 - 0.2 % Final   Performed at [facility], [place] [address], [place], [place] [address]  . Sodium 12/22/2017 136  135 - 145 mmol/L Final  . Potassium 12/22/2017 3.4*  3.5 - 5.1 mmol/L Final  . Chloride 12/22/2017 98  98 - 111 mmol/L Final  . CO2 12/22/2017 26  22 - 32 mmol/L Final  . Glucose, Bld 12/22/2017 243* 70 - 99 mg/dL Final  . BUN 12/22/2017 43* 8 - 23 mg/dL Final  . Creatinine, Ser 12/22/2017 1.76* 0.61 - 1.24 mg/dL Final  . Calcium 12/22/2017 8.9  8.9 - 10.3 mg/dL Final  . Total Protein 12/22/2017 7.2  6.5 - 8.1 g/dL Final  . Albumin 12/22/2017 3.4* 3.5 - 5.0 g/dL Final  . AST 12/22/2017 18  15 - 41 U/L Final  . ALT 12/22/2017 7  0 - 44 U/L Final  . Alkaline Phosphatase 12/22/2017 118  38 - 126 U/L Final  . Total Bilirubin 12/22/2017 0.6  0.3 - 1.2 mg/dL Final  . GFR calc non Af Amer 12/22/2017 35* >60 mL/min Final  . GFR calc Af Amer 12/22/2017 41* >60 mL/min Final   Comment:  (NOTE) The eGFR has been calculated using the CKD EPI equation. This calculation has not been validated in all clinical situations. eGFR's persistently <60 mL/min signify possible Chronic Kidney Disease.   Georgiann Hahn gap 12/22/2017 12  5 - 15 Final   Performed at [facility], [place] [address], [place], [place] [address]  . Prothrombin Time 12/22/2017 13.2  11.4 - 15.2 seconds Final  . INR 12/22/2017 1.01   Final   Performed at [facility], [place] [address], [place], [place] [address]  . aPTT 12/22/2017 36  24 - 36 seconds Final   Performed at [facility], [place] [address], [place], [place] [address]  . ABO/RH(D) 12/22/2017 O NEG   Final  . Antibody Screen 12/22/2017 NEG   Final  . Sample Expiration 12/22/2017    Final                   Value:12/25/2017 Performed at [facility], [place] [address] [place], [place] [address]   . Glucose-Capillary 12/22/2017 238* 70 - 99 mg/dL Final  . ABO/RH(D) 12/22/2017    Final                   Value:O NEG Performed at [facility], [place] [address], [place], [place] [address]   . Specimen Description 12/22/2017    Final                   Value:KNEE RIGHT Performed at [facility], [place] [address] [place], [place] [address]   . Special Requests 12/22/2017    Final                   Value:NONE Performed at [facility], [place] [address], [place], [place] [address]   . Gram Stain 12/22/2017    Final                   Value:MODERATE WBC PRESENT, PREDOMINANTLY PMN NO ORGANISMS SEEN Gram Stain Report Called to,Read Back By and Verified With: [facility] AT 2153 ON 12/22/17 BY N.THOMPSON Performed at [facility], [place] [address] [place], [place] [address]   . Culture 12/22/2017    Final                   Value:No growth aerobically or anaerobically. Performed at [facility], [place] [address]  [place], [place] [address]   . Report Status 12/22/2017 12/28/2017 FINAL  Final  . Glucose-Capillary 12/22/2017 116* 70 - 99 mg/dL Final  . Comment 1 12/22/2017 Document in Chart   Final  . WBC 12/23/2017 8.0  4.0 - 10.5 K/uL Final  . RBC 12/23/2017 3.44* 4.22 - 5.81 MIL/uL Final  . Hemoglobin 12/23/2017 10.9* 13.0 - 17.0 g/dL Final  . HCT 12/23/2017 35.3* 39.0 - 52.0 % Final  . MCV 12/23/2017 102.6* 80.0 - 100.0 fL Final  . MCH 12/23/2017 31.7  26.0 - 34.0 pg Final  . MCHC 12/23/2017 30.9  30.0 - 36.0 g/dL Final  . RDW 12/23/2017 15.9* 11.5 - 15.5 % Final  . Platelets 12/23/2017 228  150 - 400 K/uL Final  . nRBC 12/23/2017 0.0  0.0 - 0.2 % Final   Performed at [facility], [place] [address] [place], [place] [address]  . Sodium 12/23/2017 138  135 - 145 mmol/L Final  . Potassium 12/23/2017 5.1  3.5 - 5.1 mmol/L Final   Comment: DELTA CHECK NOTED SLIGHT HEMOLYSIS   . Chloride 12/23/2017 103  98 - 111 mmol/L Final  . CO2 12/23/2017 23  22 - 32 mmol/L Final  . Glucose, Bld 12/23/2017 273* 70 - 99 mg/dL Final  . BUN 12/23/2017 47* 8 - 23 mg/dL Final  . Creatinine, Ser 12/23/2017 1.91* 0.61 - 1.24 mg/dL Final  . Calcium 12/23/2017 8.6* 8.9 - 10.3 mg/dL Final  . GFR calc non Af Amer 12/23/2017 32* >60 mL/min Final  . GFR calc Af Amer 12/23/2017 37* >60 mL/min Final   Comment: (NOTE) The eGFR has been calculated using the CKD EPI equation. This calculation has not been validated in all clinical situations. eGFR's persistently <60 mL/min signify possible Chronic Kidney Disease.   Georgiann Hahn gap 12/23/2017 12  5 - 15 Final   Performed at [facility], [place] [address], [place], [place] [address]  . Glucose-Capillary 12/23/2017 223* 70 - 99 mg/dL Final  . Glucose-Capillary 12/23/2017 274* 70 - 99 mg/dL Final  . WBC 12/24/2017 12.7* 4.0 - 10.5 K/uL Final  . RBC 12/24/2017 3.02* 4.22 - 5.81 MIL/uL Final  . Hemoglobin 12/24/2017 9.5* 13.0 - 17.0 g/dL Final  .  HCT 12/24/2017 30.2* 39.0 - 52.0 % Final  . MCV 12/24/2017 100.0  80.0 - 100.0 fL Final  . MCH 12/24/2017 31.5  26.0 - 34.0 pg Final  . MCHC 12/24/2017 31.5  30.0 - 36.0 g/dL Final  . RDW 12/24/2017 15.7* 11.5 - 15.5 % Final  . Platelets 12/24/2017 255  150 - 400 K/uL Final  . nRBC 12/24/2017 0.0  0.0 - 0.2 % Final   Performed at [facility], [place] [address], [place], [place] [address]  . Sodium 12/24/2017 139  135 - 145 mmol/L Final  . Potassium 12/24/2017 3.8  3.5 - 5.1 mmol/L Final   Comment: DELTA CHECK NOTED REPEATED TO VERIFY   . Chloride 12/24/2017 103  98 - 111 mmol/L Final  . CO2 12/24/2017 27  22 - 32 mmol/L Final  . Glucose, Bld 12/24/2017 80  70 - 99 mg/dL Final  . BUN 12/24/2017 64* 8 - 23 mg/dL Final  . Creatinine, Ser 12/24/2017 1.77* 0.61 - 1.24 mg/dL Final  . Calcium 12/24/2017 8.8* 8.9 - 10.3 mg/dL Final  . GFR calc non Af Amer 12/24/2017 36* >60 mL/min Final  . GFR calc Af Amer 12/24/2017 42* >60 mL/min Final  . Anion gap 12/24/2017 9  5 - 15 Final   Performed at [facility],  [place] [address], [place], [place] [address]  . Glucose-Capillary 12/23/2017 243* 70 - 99 mg/dL Final  . Sed Rate 12/24/2017 74* 0 - 16 mm/hr Final   Performed at [facility], [place] [address], [place], [place] [address]  . CRP 12/24/2017 9.3* <1.0 mg/dL Final   Performed at [place] [address], [place], [place] [address]  . HIV Screen 4th Generation wRfx 12/24/2017 Non Reactive  Non Reactive Final   Comment: (NOTE) Performed At: [facility] [address] [place], [place] [id number] Rush Farmer MD VW:[id number]   . HCV Ab 12/24/2017 <0.1  0.0 - 0.9 s/co ratio Final   Comment: (NOTE) Performed At: [facility] [place], [place] [id number] Rush Farmer MD [id number]   . Total CK 12/24/2017 22* 49 - 397 U/L Final   Performed at [facility], [place]  [address] [place], [place] [address]  . Glucose-Capillary 12/24/2017 62* 70 - 99 mg/dL Final  . Glucose-Capillary 12/24/2017 72  70 - 99 mg/dL Final  . WBC 12/25/2017 8.0  4.0 - 10.5 K/uL Final  . RBC 12/25/2017 2.76* 4.22 - 5.81 MIL/uL Final  . Hemoglobin 12/25/2017 8.7* 13.0 - 17.0 g/dL Final  . HCT 12/25/2017 27.6* 39.0 - 52.0 % Final  . MCV 12/25/2017 100.0  80.0 - 100.0 fL Final  . MCH 12/25/2017 31.5  26.0 - 34.0 pg Final  . MCHC 12/25/2017 31.5  30.0 - 36.0 g/dL Final  . RDW 12/25/2017 15.9* 11.5 - 15.5 % Final  . Platelets 12/25/2017 216  150 - 400 K/uL Final  . nRBC 12/25/2017 0.0  0.0 - 0.2 % Final   Performed at [facility], [place] [address] [place], [place] [address]  . Creatinine, Ser 12/25/2017 1.67* 0.61 - 1.24 mg/dL Final  . GFR calc non Af Amer 12/25/2017 39* >60 mL/min Final  . GFR calc Af Amer 12/25/2017 45* >60 mL/min Final   Performed at [place] [address] [place], [place] [address]  . Glucose-Capillary 12/24/2017 147* 70 - 99 mg/dL Final  . Glucose-Capillary 12/24/2017 169* 70 - 99 mg/dL Final  . Comment: 12/24/2017 Comment   Final   Comment: (NOTE) Non reactive HCV antibody screen is consistent with no HCV infection, unless recent infection is suspected or other evidence exists to indicate HCV infection. Performed At: [facility] [place], [place] [id number] Rush Farmer MD [id number]   . Glucose-Capillary 12/25/2017 93  70 - 99 mg/dL Final  . Glucose-Capillary 12/23/2017 190* 70 - 99 mg/dL Final  . Glucose-Capillary 12/25/2017 199* 70 - 99 mg/dL Final  Hospital Outpatient Visit on 12/18/2017  Component Date Value Ref Range Status  . WBC 12/18/2017 10.5  4.0 - 10.5 K/uL Final  . RBC 12/18/2017 4.04* 4.22 - 5.81 MIL/uL Final  . Hemoglobin 12/18/2017 12.7* 13.0 - 17.0 g/dL Final  . HCT 12/18/2017 40.1  39.0 - 52.0 % Final  . MCV 12/18/2017 99.3  80.0 - 100.0 fL Final  . MCH  12/18/2017 31.4  26.0 - 34.0 pg Final  . MCHC 12/18/2017 31.7  30.0 - 36.0 g/dL Final  . RDW 12/18/2017 15.6* 11.5 - 15.5 % Final  . Platelets 12/18/2017 330  150 - 400 K/uL Final  . nRBC 12/18/2017 0.0  0.0 - 0.2 % Final   Performed at [facility], [place] [address] [place], [place] [address]  . Sodium 12/18/2017 139  135 - 145 mmol/L  Final  . Potassium 12/18/2017 3.6  3.5 - 5.1 mmol/L Final  . Chloride 12/18/2017 101  98 - 111 mmol/L Final  . CO2 12/18/2017 28  22 - 32 mmol/L Final  . Glucose, Bld 12/18/2017 196* 70 - 99 mg/dL Final  . BUN 12/18/2017 45* 8 - 23 mg/dL Final  . Creatinine, Ser 12/18/2017 1.68* 0.61 - 1.24 mg/dL Final  . Calcium 12/18/2017 9.0  8.9 - 10.3 mg/dL Final  . GFR calc non Af Amer 12/18/2017 37* >60 mL/min Final  . GFR calc Af Amer 12/18/2017 43* >60 mL/min Final   Comment: (NOTE) The eGFR has been calculated using the CKD EPI equation. This calculation has not been validated in all clinical situations. eGFR's persistently <60 mL/min signify possible Chronic Kidney Disease.   Georgiann Hahn gap 12/18/2017 10  5 - 15 Final   Performed at [facility], [place] [address], [place], [place] [address]  . Hgb A1c MFr Bld 12/18/2017 7.5* 4.8 - 5.6 % Final   Comment: (NOTE) Pre diabetes:          5.7%-6.4% Diabetes:              >6.4% Glycemic control for   <7.0% adults with diabetes   . Mean Plasma Glucose 12/18/2017 168.55  mg/dL Final   Performed at [place] [address] [place], [place] [address]  . MRSA, PCR 12/18/2017 NEGATIVE  NEGATIVE Final  . Staphylococcus aureus 12/18/2017 NEGATIVE  NEGATIVE Final   Comment: (NOTE) The Xpert SA Assay (FDA approved for NASAL specimens in patients 49 years of age and older), is one component of a comprehensive surveillance program. It is not intended to diagnose infection nor to guide or monitor treatment. Performed at [facility], [place] [address], [place], [place] [address]   . Glucose-Capillary 12/18/2017 234* 70 - 99 mg/dL Final     X-Rays:Us Ekg Site Rite  Result Date: 12/23/2017 If Site Rite image not attached, placement could not be confirmed due to current cardiac rhythm.   EKG: Orders placed or performed during the hospital encounter of 07/04/17  . EKG 12-Lead  . EKG 12-Lead  . EKG 12-Lead  . EKG 12-Lead  . EKG 12-Lead  . EKG 12-Lead  . EKG 12-Lead  . EKG 12-Lead  . EKG 12-Lead  . EKG 12-Lead  . EKG 12-Lead  . EKG 12-Lead  . EKG 12-Lead  . EKG 12-Lead  . EKG 12-Lead  . EKG 12-Lead  . EKG  . EKG 12-Lead  . EKG 12-Lead  . EKG 12-Lead  . EKG 12-Lead  . EKG 12-Lead  . EKG 12-Lead     Hospital Course: Marc [name] is a 77 y.o. who was admitted to [facility]. They were brought to the operating room on 12/22/2017 and underwent Procedure(s): right knee resection arthroplasty; antibiotic spacer.  Patient tolerated the procedure well and was later transferred to the recovery room and then to the orthopaedic floor for postoperative care. They were given PO and IV analgesics for pain control following their surgery. They were given 24 hours of postoperative antibiotics of  Anti-infectives (From admission, onward)   Start     Dose/Rate Route Frequency Ordered Stop   12/25/17 2000  DAPTOmycin (CUBICIN) 408 mg in sodium chloride 0.9 % IVPB  Status:  Discontinued     6 mg/kg  68 kg 216.3 mL/hr over 30 Minutes Intravenous Every 48 hours 12/23/17 1806 12/24/17 0733   12/25/17  1800  vancomycin (VANCOCIN) IVPB 750 mg/150 ml premix  Status:  Discontinued     750 mg 150 mL/hr over 60 Minutes Intravenous Every 24 hours 12/24/17 1233 12/25/17 1041   12/25/17 1130  vancomycin (VANCOCIN) IVPB 750 mg/150 ml premix  Status:  Discontinued     750 mg 150 mL/hr over 60 Minutes Intravenous Every 24 hours 12/25/17 1041 12/25/17 1333   12/25/17 0000  DAPTOmycin 408 mg in sodium chloride 0.9 % 100 mL  Status:   Discontinued     6 mg/kg  68 kg 216.3 mL/hr over 30 Minutes Intravenous Every 48 hours 12/24/17 0726 12/24/17    12/25/17 0000  Vancomycin (VANCOCIN) 750-5 MG/150ML-% SOLN     750 mg 150 mL/hr over 60 Minutes Intravenous Every 24 hours 12/24/17 1837     12/24/17 2000  DAPTOmycin (CUBICIN) 500 mg in sodium chloride 0.9 % IVPB  Status:  Discontinued     500 mg 220 mL/hr over 30 Minutes Intravenous Daily 12/24/17 0733 12/24/17 1210   12/24/17 1800  vancomycin (VANCOCIN) 1,250 mg in sodium chloride 0.9 % 250 mL IVPB     1,250 mg 166.7 mL/hr over 90 Minutes Intravenous  Once 12/24/17 1233 12/24/17 1903   12/23/17 2200  ceFAZolin (ANCEF) IVPB 2g/100 mL premix  Status:  Discontinued     2 g 200 mL/hr over 30 Minutes Intravenous Every 12 hours 12/23/17 1515 12/23/17 1745   12/23/17 1815  DAPTOmycin (CUBICIN) 408 mg in sodium chloride 0.9 % IVPB     6 mg/kg  68 kg 216.3 mL/hr over 30 Minutes Intravenous NOW 12/23/17 1806 12/23/17 1923   12/23/17 0600  ceFAZolin (ANCEF) IVPB 2g/100 mL premix     2 g 200 mL/hr over 30 Minutes Intravenous On call to O.R. 12/22/17 1352 12/22/17 1700   12/22/17 2230  ceFAZolin (ANCEF) IVPB 2g/100 mL premix  Status:  Discontinued     2 g 200 mL/hr over 30 Minutes Intravenous Every 6 hours 12/22/17 1910 12/23/17 1515   12/22/17 1428  ceFAZolin (ANCEF) 2-4 GM/100ML-% IVPB  Status:  Discontinued    Note to Pharmacy:  Marchia Meiers   : cabinet override      12/22/17 1428 12/22/17 1436     and started on DVT prophylaxis in the form of Aspirin.   PT and OT were ordered for total joint protocol. Discharge planning consulted to help with postop disposition and equipment needs. Patient had a decent night on the evening of surgery. They started to get up OOB with therapy on POD #1. Hemovac drain was pulled without difficulty on day one. Intraoperative gram-stain showed predominantly PMNs with no organisms seen. PICC line was placed and ID consult was obtained for long-term  antibiotic management. Continued to work with therapy into POD #2. Dressing was changed and the incision was clean, dry, and intact with no drainage. Intra-op cultures were pending. Continued to work with therapy into POD #3. Per consult with infectious disease specialist Dr. Tommy Medal, daptomycin was the preferred antibiotic regimen x 6 weeks, however due to cost vancomycin was ultimately decided upon. Patient was seen during rounds on POD #3 and was ready for discharge to home. Final cultures showed no growth. He progressed with therapy and was discharged to home later that day in stable condition.   Per ID Consult:  Vancomycin trough 15-20 (unless otherwise indicated) Duration: 6 weeks End Date: January 7 [facility] Care Per Protocol: Biweekly labs if on vancomycin: Xx BMP with GFR Labs weekly while  on IV antibiotics: _x_ CBC with differential _x_ CRP _x_ ESR _x_ Vancomycin trough _x_ CK _x_ Please pull PIC at completion of IV antibiotics  Diet: Diabetic diet Activity: WBAT. Right LE to stay in knee immobilizer at all times. No ROM of the right knee. Follow-up: in 2 weeks with Dr. Wynelle Link Disposition: Home with HHPT Discharged Condition: stable   Discharge Instructions    Call MD / Call 911   Complete by:  As directed    If you experience chest pain or shortness of breath, CALL 911 and be transported to the hospital emergency room.  If you develope a fever above 101 F, pus (white drainage) or increased drainage or redness at the wound, or calf pain, call your surgeon's office.   Change dressing   Complete by:  As directed    Change the dressing daily with sterile 4 x 4 inch gauze dressing and apply TED hose.   Constipation Prevention   Complete by:  As directed    Drink plenty of fluids.  Prune juice may be helpful.  You may use a stool softener, such as Colace (over the counter) 100 mg twice a day.  Use MiraLax (over the counter) for constipation as needed.   Diet - low sodium heart  healthy   Complete by:  As directed    Driving restrictions   Complete by:  As directed    No driving for 2 weeks   Increase activity slowly as tolerated   Complete by:  As directed      Allergies as of 12/25/2017      Reactions   Actos [pioglitazone] Swelling, Other (See Comments)   LOWER EXTREMITY EDEMA      Medication List    STOP taking these medications   cephALEXin 500 MG capsule Commonly known as:  KEFLEX     TAKE these medications   allopurinol 300 MG tablet Commonly known as:  ZYLOPRIM Take 300 mg by mouth 2 (two) times daily.   aspirin 325 MG tablet Take 1 tablet (325 mg total) by mouth daily for 18 days. Take one 325 mg aspirin once a day for three weeks following surgery to prevent blood clots.   atorvastatin 10 MG tablet Commonly known as:  LIPITOR Take 10 mg by mouth every morning.   clopidogrel 75 MG tablet Commonly known as:  PLAVIX Take 75 mg by mouth every other day.   docusate sodium 100 MG capsule Commonly known as:  COLACE Take 100 mg by mouth 2 (two) times daily.   doxycycline 100 MG capsule Commonly known as:  MONODOX Take 100 mg by mouth 2 (two) times daily.   ferrous sulfate 325 (65 FE) MG tablet Take 325 mg by mouth daily with breakfast.   furosemide 80 MG tablet Commonly known as:  LASIX Take 1 tablet (80 mg total) by mouth daily.   gabapentin 600 MG tablet Commonly known as:  NEURONTIN Take 1 tablet (600 mg total) by mouth every 12 (twelve) hours. What changed:  when to take this   glimepiride 4 MG tablet Commonly known as:  AMARYL Take 4 mg by mouth 2 (two) times daily at 10 AM and 5 PM. Taking Amaryl 4 mg 1-2 times per day. Patient states he takes Amaryl 4 mg QAM and if glucose is over 150 mg/dl later in the day he takes second dose of Amaryl 4 mg   indomethacin 25 MG capsule Commonly known as:  INDOCIN Take 25 mg by mouth 2 (two)  times daily as needed (pain.).   insulin glargine 100 UNIT/ML injection Commonly known as:   LANTUS Inject 0.07 mLs (7 Units total) into the skin at bedtime.   methocarbamol 500 MG tablet Commonly known as:  ROBAXIN Take 1 tablet (500 mg total) by mouth every 6 (six) hours as needed for muscle spasms.   metoprolol tartrate 25 MG tablet Commonly known as:  LOPRESSOR Take 0.5 tablets (12.5 mg total) by mouth 2 (two) times daily.   multivitamin-prenatal 27-0.8 MG Tabs tablet Take 1 tablet by mouth daily at 12 noon.   omeprazole 20 MG capsule Commonly known as:  PRILOSEC Take 20 mg by mouth as needed.   oxyCODONE 5 MG immediate release tablet Commonly known as:  Oxy IR/ROXICODONE Take 1-2 tablets (5-10 mg total) by mouth every 6 (six) hours as needed for moderate pain (pain score 4-6). What changed:    medication strength  how much to take  reasons to take this  Another medication with the same name was removed. Continue taking this medication, and follow the directions you see here.   polyethylene glycol packet Commonly known as:  MIRALAX / GLYCOLAX Take 17 g by mouth at bedtime.   senna-docusate 8.6-50 MG tablet Commonly known as:  Senokot-S Take 1 tablet by mouth at bedtime as needed for mild constipation.   Vancomycin 750-5 MG/150ML-% Soln Commonly known as:  VANCOCIN Inject 150 mLs (750 mg total) into the vein daily.            Discharge Care Instructions  (From admission, onward)         Start     Ordered   12/24/17 0000  Change dressing    Comments:  Change the dressing daily with sterile 4 x 4 inch gauze dressing and apply TED hose.   12/24/17 [place] [place] [facility] Follow up.   Specialty:  [place] Why:  nurse for IV abx and physical therapy Contact information: [place] [address] [phone]        Gaynelle Arabian, MD. Schedule an appointment as soon as possible for a visit on 01/06/2018.   Specialty:  Orthopedic Surgery Contact  information: [address] [place] [address] [place] [address] [phone]        Tommy Medal, Lavell Islam, MD Follow up.   Specialty:  Infectious Diseases Why:  02/03/18 @ 2:45. Please call to reschedule if you are not able to make this appointment.  Contact information: [address] [place] [address] [phone]           Signed: Theresa Duty, PA-C Orthopedic Surgery 12/29/2017, 10:24 AM

## 2018-01-08 ENCOUNTER — Telehealth: Payer: Self-pay | Admitting: Pharmacist

## 2018-01-08 NOTE — Telephone Encounter (Signed)
Thx Cassie !

## 2018-01-08 NOTE — Telephone Encounter (Signed)
Marc [name] from [facility] called today to give me an update on patient's labs. His labs from 12/10 - SCr 1.64 (stable), vanc trough 15. He is currently on 1,250 mg IV q48hr. Will continue with that current dose.

## 2018-02-03 ENCOUNTER — Inpatient Hospital Stay: Payer: Medicare Other | Admitting: Infectious Disease

## 2018-07-27 ENCOUNTER — Telehealth: Payer: Self-pay | Admitting: Internal Medicine

## 2018-07-27 NOTE — Telephone Encounter (Signed)
Spoke with patient and have scheduled an In-Person Palliative consult for 07/29/18 @ 2:30 PM.  Verbal consent rec'd.

## 2018-07-29 ENCOUNTER — Other Ambulatory Visit: Payer: Medicare Other | Admitting: Internal Medicine

## 2018-07-29 ENCOUNTER — Other Ambulatory Visit: Payer: Self-pay

## 2018-07-29 DIAGNOSIS — E46 Unspecified protein-calorie malnutrition: Secondary | ICD-10-CM

## 2018-07-29 DIAGNOSIS — Z515 Encounter for palliative care: Secondary | ICD-10-CM

## 2018-07-31 NOTE — Progress Notes (Unsigned)
[facility] Consult Note Telephone: [phone]  Fax: [phone]  PATIENT NAME: Marc [name] DOB: 1940/07/07 MRN: [id number]  PRIMARY CARE PROVIDER:   [name], MD  REFERRING PROVIDER:  [name], MD [place],  [place] [address]  RESPONSIBLE PARTY:     ASSESSMENT:        RECOMMENDATIONS and PLAN:  1.  I spent *** minutes providing this consultation,  from *** to ***. More than 50% of the time in this consultation was spent coordinating communication.   HISTORY OF PRESENT ILLNESS:  Marc [name] is a 78 y.o. year old male with multiple medical problems including ***. Palliative Care was asked to help address goals of care.   CODE STATUS:   PPS: 0% HOSPICE ELIGIBILITY/DIAGNOSIS: TBD  PAST MEDICAL HISTORY:  Past Medical History:  Diagnosis Date  . Anticoagulant long-term use    plavix  . Bradycardia   . Chronic constipation   . Chronic pain of right knee   . CKD (chronic kidney disease), stage III (Marc [name])   . Depression   . Diastolic CHF ([place])   . DOE (dyspnea on exertion)   . GERD (gastroesophageal reflux disease)   . Gout   . History of bleeding peptic ulcer   . History of colon polyps   . History of GI bleed 2007   upper gi bleed from peptic ulcer,  required transfusions  . Hyperlipidemia   . Hypertension   . IDA (iron deficiency anemia)   . Infection of total right knee replacement ([place]) 11/2017   previous has had right TKA infeciton 04-14-2015  s/p  resection arthroplasty with antiobiotic spacer  . Insomnia   . Neuropathy   . OA (osteoarthritis)    knees, back  . PAD (peripheral artery disease) [facility]) vascular-- dr Scot Dock   01/ 2017  s/p  right femoral peroneal bypass/ amputation right 1st and 2nd toes;    01/ 2014  s/p  stenting to left common/ external iliac   . 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 Thoracic ascending aortic aneurysm [facility])     followed by dr gerhardt-- last chest CT , stable at 4.5cm  . Tobacco abuse   . Type II diabetes mellitus ([place])    followed by pcp    SOCIAL HX:  Social History   Tobacco Use  . Smoking status: Current Every Day Smoker    Packs/day: 1.50    Years: 64.00    Pack years: 96.00    Types: Cigarettes  . Smokeless tobacco: Former Systems developer    Types: [place] date: 12/19/2007  . Tobacco comment: since age 44  Substance Use Topics  . Alcohol use: No    Alcohol/week: 0.0 standard drinks    ALLERGIES:  Allergies  Allergen Reactions  . Actos [Pioglitazone] Swelling and Other (See Comments)    LOWER EXTREMITY EDEMA      PERTINENT MEDICATIONS:  Outpatient Encounter Medications as of 02/03/2018  Medication Sig  . allopurinol (ZYLOPRIM) 300 MG tablet Take 300 mg by mouth 2 (two) times daily.   [name] atorvastatin (LIPITOR) 10 MG tablet Take 10 mg by mouth every morning.   . clopidogrel (PLAVIX) 75 MG tablet Take 75 mg by mouth every other day.   . docusate sodium (COLACE) 100 MG capsule Take 100 mg by mouth 2 (two) times daily.  [name] doxycycline (MONODOX) 100 MG capsule Take 100 mg by mouth 2 (  two) times daily.  . ferrous sulfate 325 (65 FE) MG tablet Take 325 mg by mouth daily with breakfast.  . furosemide (LASIX) 80 MG tablet Take 1 tablet (80 mg total) by mouth daily.  [name] gabapentin (NEURONTIN) 600 MG tablet Take 1 tablet (600 mg total) by mouth every 12 (twelve) hours. (Patient taking differently: Take 600 mg by mouth at bedtime. )  . glimepiride (AMARYL) 4 MG tablet Take 4 mg by mouth 2 (two) times daily at 10 AM and 5 PM. Taking Amaryl 4 mg 1-2 times per day. Patient states he takes Amaryl 4 mg QAM and if glucose is over 150 mg/dl later in the day he takes second dose of Amaryl 4 mg  . indomethacin (INDOCIN) 25 MG capsule Take 25 mg by mouth 2 (two) times daily as needed (pain.).   [name] insulin glargine (LANTUS) 100 UNIT/ML injection Inject 0.07 mLs (7 Units total) into the skin at bedtime.  (Patient not taking: Reported on 12/18/2017)  . methocarbamol (ROBAXIN) 500 MG tablet Take 1 tablet (500 mg total) by mouth every 6 (six) hours as needed for muscle spasms.  . metoprolol tartrate (LOPRESSOR) 25 MG tablet Take 0.5 tablets (12.5 mg total) by mouth 2 (two) times daily. (Patient not taking: Reported on 07/04/2017)  . omeprazole (PRILOSEC) 20 MG capsule Take 20 mg by mouth as needed.  [name] oxyCODONE (OXY IR/ROXICODONE) 5 MG immediate release tablet Take 1-2 tablets (5-10 mg total) by mouth every 6 (six) hours as needed for moderate pain (pain score 4-6).  [name] polyethylene glycol (MIRALAX / GLYCOLAX) packet Take 17 g by mouth at bedtime.  . Prenatal Vit-Fe Fumarate-FA (MULTIVITAMIN-PRENATAL) 27-0.8 MG TABS tablet Take 1 tablet by mouth daily at 12 noon.  . senna-docusate (SENOKOT-S) 8.6-50 MG tablet Take 1 tablet by mouth at bedtime as needed for mild constipation. (Patient not taking: Reported on 07/04/2017)  . Vancomycin (VANCOCIN) 750-5 MG/150ML-% SOLN Inject 150 mLs (750 mg total) into the vein daily.   No facility-administered encounter medications on file as of 02/03/2018.     PHYSICAL EXAM:   General: NAD, frail appearing, thin Cardiovascular: regular rate and rhythm Pulmonary: clear ant fields Abdomen: soft, nontender, + bowel sounds GU: no suprapubic tenderness Extremities: no edema, no joint deformities Skin: no rashes Neurological: Weakness but otherwise nonfocal  Gonzella Lex, NP

## 2018-08-10 NOTE — Progress Notes (Signed)
[facility] Consult Note Telephone: [phone]  Fax: [phone]  PATIENT NAME: Marc [name] DOB: [birth year] MRN: [id number]  PRIMARY CARE PROVIDER:   [name], MD  REFERRING PROVIDER:  [name], MD [place],  [place] [address]  RESPONSIBLE PARTY:   Self     RECOMMENDATIONS and PLAN:  Palliative care encounter  Z51.5  1.  Advance care planning:  Discussed palliative and hospice care.  Pt's goals are to remain at home, improve appetite and avoid hospitalization, aggressive therapies.  Advanced directives reviewed and pt desires DNR with comfort measures, no IV fluids, antibiotics or feeding tube.  MOST form and DNR completed and left in home with patient. He would like to continue to have his PCP to manage all healthcare needs. Discussed pt stat Palliative care will follow-up with patient.  2.  Loss of appetite:  Consider initiation of Mirtazapine 15mg  at bedtime for improvement of appetite and mood.  Pt agrees to try meds along with increasing protein supplements to improve strength and quality of life.  Poor longterm outcome with continued poor nutrition and weight loss.   3.  Depression:  Related to concerns of poor quality of life and multiple comorbidities. Wife expired in April 2020.  Supportive care. Counseling offered.  Hopeful for improvement with use of Mirtazipine.  Continue to monitor.  4.  Shortness of breath:  Related to severe COPD, debilitation and long term smoking hx.  No plans on smoking cessation which was still encouraged. Guarded activities.  Bronchodilator as needed. Monitor.  I spent 60 minutes providing this consultation,  from 1430 to 1530. More than 50% of the time in this consultation was spent coordinating communication with patient.   HISTORY OF PRESENT ILLNESS:  Marc [name] is a 78 y.o. year old male with multiple medical problems including T2DM with CKD 3,  severe COPD.  Patient reports poor appetite with decreased energy and weight over last several months.  His wife expired due to a terminal cancer in April 2020.  He has felt sad since her loss.   Palliative Care was asked to help address goals of care.   CODE STATUS:  DNAR/DNI  PPS: 40% HOSPICE ELIGIBILITY/DIAGNOSIS: TBD  PAST MEDICAL HISTORY:  Past Medical History:  Diagnosis Date  . Anticoagulant long-term use    plavix  . Bradycardia   . Chronic constipation   . Chronic pain of right knee   . CKD (chronic kidney disease), stage III ([place])   . Depression   . Diastolic CHF ([place])   . DOE (dyspnea on exertion)   . GERD (gastroesophageal reflux disease)   . Gout   . History of bleeding peptic ulcer   . History of colon polyps   . History of GI bleed 2007   upper gi bleed from peptic ulcer,  required transfusions  . Hyperlipidemia   . Hypertension   . IDA (iron deficiency anemia)   . Infection of total right knee replacement ([place]) 11/2017   previous has had right TKA infeciton 04-14-2015  s/p  resection arthroplasty with antiobiotic spacer  . Insomnia   . Neuropathy   . OA (osteoarthritis)    knees, back  . PAD (peripheral artery disease) [facility]) vascular-- dr Scot Dock   01/ 2017  s/p  right femoral peroneal bypass/ amputation right 1st and 2nd toes;    01/ 2014  s/p  stenting to left common/ external iliac   . S/P  insertion of spinal cord stimulator 09/20/2016  . Thoracic ascending aortic aneurysm [facility])    followed by dr gerhardt-- last chest CT , stable at 4.5cm  . Tobacco abuse   . Type II diabetes mellitus ([place])    followed by pcp    SOCIAL HX:  Social History   Tobacco Use  . Smoking status: Current Every Day Smoker    Packs/day: 1.50    Years: 64.00    Pack years: 96.00    Types: Cigarettes  . Smokeless tobacco: Former Systems developer    Types: [place] date: 12/19/2007  . Tobacco comment: since age 78  Substance Use Topics  . Alcohol use: No    Alcohol/week: 0.0  standard drinks    ALLERGIES:  Allergies  Allergen Reactions  . Actos [Pioglitazone] Swelling and Other (See Comments)    LOWER EXTREMITY EDEMA      PERTINENT MEDICATIONS:  Outpatient Encounter Medications as of 07/29/2018  Medication Sig  . allopurinol (ZYLOPRIM) 300 MG tablet Take 300 mg by mouth 2 (two) times daily.   [name] atorvastatin (LIPITOR) 10 MG tablet Take 10 mg by mouth every morning.   . clopidogrel (PLAVIX) 75 MG tablet Take 75 mg by mouth every other day.   . docusate sodium (COLACE) 100 MG capsule Take 100 mg by mouth 2 (two) times daily.  [name] doxycycline (MONODOX) 100 MG capsule Take 100 mg by mouth 2 (two) times daily.  . ferrous sulfate 325 (65 FE) MG tablet Take 325 mg by mouth daily with breakfast.  . furosemide (LASIX) 80 MG tablet Take 1 tablet (80 mg total) by mouth daily.  [name] gabapentin (NEURONTIN) 600 MG tablet Take 1 tablet (600 mg total) by mouth every 12 (twelve) hours. (Patient taking differently: Take 600 mg by mouth at bedtime. )  . glimepiride (AMARYL) 4 MG tablet Take 4 mg by mouth 2 (two) times daily at 10 AM and 5 PM. Taking Amaryl 4 mg 1-2 times per day. Patient states he takes Amaryl 4 mg QAM and if glucose is over 150 mg/dl later in the day he takes second dose of Amaryl 4 mg  . indomethacin (INDOCIN) 25 MG capsule Take 25 mg by mouth 2 (two) times daily as needed (pain.).   [name] insulin glargine (LANTUS) 100 UNIT/ML injection Inject 0.07 mLs (7 Units total) into the skin at bedtime. (Patient not taking: Reported on 12/18/2017)  . methocarbamol (ROBAXIN) 500 MG tablet Take 1 tablet (500 mg total) by mouth every 6 (six) hours as needed for muscle spasms.  . metoprolol tartrate (LOPRESSOR) 25 MG tablet Take 0.5 tablets (12.5 mg total) by mouth 2 (two) times daily. (Patient not taking: Reported on 07/04/2017)  . omeprazole (PRILOSEC) 20 MG capsule Take 20 mg by mouth as needed.  [name] oxyCODONE (OXY IR/ROXICODONE) 5 MG immediate release tablet Take 1-2 tablets (5-10 mg  total) by mouth every 6 (six) hours as needed for moderate pain (pain score 4-6).  [name] polyethylene glycol (MIRALAX / GLYCOLAX) packet Take 17 g by mouth at bedtime.  . Prenatal Vit-Fe Fumarate-FA (MULTIVITAMIN-PRENATAL) 27-0.8 MG TABS tablet Take 1 tablet by mouth daily at 12 noon.  . senna-docusate (SENOKOT-S) 8.6-50 MG tablet Take 1 tablet by mouth at bedtime as needed for mild constipation. (Patient not taking: Reported on 07/04/2017)  . Vancomycin (VANCOCIN) 750-5 MG/150ML-% SOLN Inject 150 mLs (750 mg total) into the vein daily.   No facility-administered encounter medications on file as of 07/29/2018.  PHYSICAL EXAM:   General: NAD, frail appearing, thin Cardiovascular: regular rate and rhythm Pulmonary: coarse rhonchi in upper lobes.  Diminished breath sounds in bases Abdomen: soft, nontender, + bowel sounds Extremities:R knee edema and tenderness.  No erythema.  Ambulates with rollator Skin: hyperpigmentation forearms Neurological: A&O x3, weakness  Psych:  Appears agitated but cooperative. Depressed mood.  Gonzella Lex, NP-C

## 2018-08-13 ENCOUNTER — Other Ambulatory Visit: Payer: Medicare Other | Admitting: Internal Medicine

## 2018-08-20 ENCOUNTER — Other Ambulatory Visit: Payer: Medicare Other | Admitting: Internal Medicine

## 2018-08-21 ENCOUNTER — Telehealth: Payer: Self-pay

## 2018-08-21 NOTE — Telephone Encounter (Signed)
SW received referral to contact patient from [place], NP. SW attempted to contact patient, unable to leave message. Patient called SW back. SW introduced self and role. Patient provided brief history. Patient was open, talkative. Patient lives alone, is a widower. Patient feels lonely at times. SW assessed safety, patient denies SI/HI at this time. Patient does note that he questions why he is "still here" when thinking of all the family and friends that have passed. Patient acknowledged that life is "precious" and said "we cannot take it for granted". Patient has been doing "well" in his opinion. Patient denies pain or falls, uses his walker. SW and patient talked at length about his contributions to his community with his involvement in his church, singing at nursing facilities and prisons and helping his neighbors. Patient talked about his dog that he loves and enjoys riding around on his 4x4 with him in the yard. Patient said he has supportive neighbors and friends that check on him. Patient denies any concerns for his care at this time. Patient appreciates SW phone call. SW assessed care needs, discussed coping skills, provided emotional support and used active and reflective listening. I spent 60 minutes with this patient.

## 2018-08-27 ENCOUNTER — Other Ambulatory Visit: Payer: Medicare Other | Admitting: Internal Medicine

## 2018-08-28 ENCOUNTER — Other Ambulatory Visit: Payer: Self-pay

## 2018-09-10 ENCOUNTER — Other Ambulatory Visit: Payer: Medicare Other | Admitting: Internal Medicine

## 2018-09-11 ENCOUNTER — Other Ambulatory Visit: Payer: Self-pay

## 2018-09-17 ENCOUNTER — Other Ambulatory Visit: Payer: Self-pay | Admitting: Cardiothoracic Surgery

## 2018-09-17 DIAGNOSIS — I712 Thoracic aortic aneurysm, without rupture, unspecified: Secondary | ICD-10-CM

## 2018-10-08 ENCOUNTER — Other Ambulatory Visit: Payer: Medicare Other | Admitting: Internal Medicine

## 2018-10-09 ENCOUNTER — Other Ambulatory Visit: Payer: Self-pay

## 2018-10-22 ENCOUNTER — Other Ambulatory Visit: Payer: Medicare Other

## 2018-10-22 ENCOUNTER — Ambulatory Visit: Payer: Medicare Other | Admitting: Cardiothoracic Surgery

## 2018-12-02 ENCOUNTER — Other Ambulatory Visit: Payer: Self-pay

## 2018-12-02 ENCOUNTER — Other Ambulatory Visit: Payer: Medicare Other | Admitting: Internal Medicine

## 2019-03-02 ENCOUNTER — Other Ambulatory Visit: Payer: Medicare Other | Admitting: Internal Medicine

## 2019-05-04 ENCOUNTER — Other Ambulatory Visit: Payer: Self-pay

## 2019-05-04 ENCOUNTER — Other Ambulatory Visit: Payer: Medicare Other | Admitting: Internal Medicine

## 2019-05-04 DIAGNOSIS — E43 Unspecified severe protein-calorie malnutrition: Secondary | ICD-10-CM

## 2019-05-04 DIAGNOSIS — Z515 Encounter for palliative care: Secondary | ICD-10-CM

## 2019-05-04 NOTE — Progress Notes (Addendum)
[facility] Consult Note Telephone: [phone]  Fax: [phone]  PATIENT NAME: Marc [name] DOB: Jun 12, 1940 MRN: [id number]  PRIMARY CARE PROVIDER:   [name], MD  REFERRING PROVIDER:  [name], MD [place],  [place] [address]  RESPONSIBLE PARTY:   Self\   RECOMMENDATIONS and PLAN:  Palliative care encounter  Z51.5  1.  Advance care planning:  Previous selection of DNAR code status.  Pt has no desire to go to hospital, have diagnostic testing or aggressive therapies or Hospice care.  He reports that he is ready to die but denies thoughts of harming self. Discussed end of life care with patient. Phoned Dr. Arelia Sneddon to discuss current patient status.  Message left with office staff for him to call this NP.  Phone discussion with Dr. Arelia Sneddon whom is aware of pt status.  Agreement of goal of comfort care measures. No plans for referral to Hospice unless patient requests same.  Supportive conversation with patient.  Palliative care will f/u with patient next week.   2. Fatigue:  Related to poor nutritional intake and progression of debilitation.  Pt. Declines any alteration of treatments.    3.  Chronic pain:  Progression.  No improvement with use of Oxycodone 15mg  q 6hrs.  Patient is hesitant to alter current dose however he is obviously uncomfortable. Consider Morphine concentrate to improved pain control. Discussed with PCP.  4.  Hematuria:  Currently being managed by PCP.  No plans for further evaluation.  Attempt to increase hydration.   I spent 90 minutes providing this consultation,  from 1500 to 1630. More than 50% of the time in this consultation was spent coordinating communication.   HISTORY OF PRESENT ILLNESS: Follow-up with Delman Kitten. Pt. States that he is much weaker and has "not been doing well since his cousin died from [place]" recently. Tearful with conversation related to loss  of family member and his own health.  Reports that he has been eating very little and has no desire to seek treatment for any of his health issues.  He reports that he feels that the end of life is near.  Denies thoughts or plans of self harm.  Palliative Care was asked to help address goals of care.   CODE STATUS: DNAR/DNI  PPS: 40% very weak HOSPICE ELIGIBILITY/DIAGNOSIS: YES/  Severe protein calorie malnutrition, CHF  PAST MEDICAL HISTORY:  Past Medical History:  Diagnosis Date  . Anticoagulant long-term use    plavix  . Bradycardia   . Chronic constipation   . Chronic pain of right knee   . CKD (chronic kidney disease), stage III ([place])   . Depression   . Diastolic CHF ([place])   . DOE (dyspnea on exertion)   . GERD (gastroesophageal reflux disease)   . Gout   . History of bleeding peptic ulcer   . History of colon polyps   . History of GI bleed 2007   upper gi bleed from peptic ulcer,  required transfusions  . Hyperlipidemia   . Hypertension   . IDA (iron deficiency anemia)   . Infection of total right knee replacement ([place]) 11/2017   previous has had right TKA infeciton 04-14-2015  s/p  resection arthroplasty with antiobiotic spacer  . Insomnia   . Neuropathy   . OA (osteoarthritis)    knees, back  . PAD (peripheral artery disease) [facility]) vascular-- dr Scot Dock   01/ 2017  s/p  right  femoral peroneal bypass/ amputation right 1st and 2nd toes;    01/ 2014  s/p  stenting to left common/ external iliac   . 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 Thoracic ascending aortic aneurysm [facility])    followed by dr gerhardt-- last chest CT , stable at 4.5cm  . Tobacco abuse   . Type II diabetes mellitus ([place])    followed by pcp    SOCIAL HX:  Lives alone   Tobacco Use  . Smoking status: Current Every Day Smoker    Packs/day: 1.50    Years: 64.00    Pack years: 96.00    Types: Cigarettes  . Smokeless tobacco: Former Systems developer    Types: [place] date: 12/19/2007  .  Tobacco comment: since age 79  Substance Use Topics  . Alcohol use: No    Alcohol/week: 0.0 standard drinks    PERTINENT MEDICATIONS:  Outpatient Encounter Medications as of 07/29/2018  Medication Sig  . allopurinol (ZYLOPRIM) 300 MG tablet Take 300 mg by mouth 2 (two) times daily.   [name] atorvastatin (LIPITOR) 10 MG tablet Take 10 mg by mouth every morning.   . clopidogrel (PLAVIX) 75 MG tablet Take 75 mg by mouth every other day.   . docusate sodium (COLACE) 100 MG capsule Take 100 mg by mouth 2 (two) times daily.  [name] doxycycline (MONODOX) 100 MG capsule Take 100 mg by mouth 2 (two) times daily.  . ferrous sulfate 325 (65 FE) MG tablet Take 325 mg by mouth daily with breakfast.  . furosemide (LASIX) 80 MG tablet Take 1 tablet (80 mg total) by mouth daily.  [name] gabapentin (NEURONTIN) 600 MG tablet Take 1 tablet (600 mg total) by mouth every 12 (twelve) hours. (Patient taking differently: Take 600 mg by mouth at bedtime. )  . glimepiride (AMARYL) 4 MG tablet Take 4 mg by mouth 2 (two) times daily at 10 AM and 5 PM. Taking Amaryl 4 mg 1-2 times per day. Patient states he takes Amaryl 4 mg QAM and if glucose is over 150 mg/dl later in the day he takes second dose of Amaryl 4 mg  . indomethacin (INDOCIN) 25 MG capsule Take 25 mg by mouth 2 (two) times daily as needed (pain.).   [name] insulin glargine (LANTUS) 100 UNIT/ML injection Inject 0.07 mLs (7 Units total) into the skin at bedtime. (Patient not taking: Reported on 12/18/2017)  . methocarbamol (ROBAXIN) 500 MG tablet Take 1 tablet (500 mg total) by mouth every 6 (six) hours as needed for muscle spasms.  . metoprolol tartrate (LOPRESSOR) 25 MG tablet Take 0.5 tablets (12.5 mg total) by mouth 2 (two) times daily. (Patient not taking: Reported on 07/04/2017)  . omeprazole (PRILOSEC) 20 MG capsule Take 20 mg by mouth as needed.  [name] oxyCODONE (OXY IR/ROXICODONE) 5 MG immediate release tablet Take 1-2 tablets (5-10 mg total) by mouth every 6 (six) hours as  needed for moderate pain (pain score 4-6).  [name] polyethylene glycol (MIRALAX / GLYCOLAX) packet Take 17 g by mouth at bedtime.  . Prenatal Vit-Fe Fumarate-FA (MULTIVITAMIN-PRENATAL) 27-0.8 MG TABS tablet Take 1 tablet by mouth daily at 12 noon.  . senna-docusate (SENOKOT-S) 8.6-50 MG tablet Take 1 tablet by mouth at bedtime as needed for mild constipation. (Patient not taking: Reported on 07/04/2017)  . Vancomycin (VANCOCIN) 750-5 MG/150ML-% SOLN Inject 150 mLs (750 mg total) into the vein daily.   No facility-administered encounter medications on file as of 07/29/2018.  PHYSICAL EXAM:   General: Chronically ill, frail appearing, thin Cardiovascular: regular rate and rhythm Pulmonary: grunting occasionally with respirations Abdomen: soft, nontender, + bowel sounds Extremities: no edema, no joint deformities.   Skin: Poor turgor.  Hyperpigmentation of arms Neurological: Very weak. Requires assistance with arising to a standing position and walking up steps.  Drowsy. Rests with head flexed forward Psych:  Tearful when discussing recent death of family member  Gonzella Lex, NP-C

## 2019-05-05 ENCOUNTER — Other Ambulatory Visit: Payer: Self-pay

## 2019-05-11 NOTE — Progress Notes (Signed)
[facility] Consult Note Telephone: [phone]  Fax: [phone]  PATIENT NAME: Marc [name] DOB: 1940/02/14 MRN: [id number]  PRIMARY CARE PROVIDER:   [name], MD  REFERRING PROVIDER:  [name], MD [place],  [place] [address]  RESPONSIBLE PARTY:   Self     RECOMMENDATIONS and PLAN:  Palliative care encounter  Z51.5  1.  Advance care planning: MOST form and DNR completed and left in home with patient. No plans for escalation of care. Palliative care will follow-up with patient.  2.  Loss of appetite:  Improving since beginning Mirtazapine.  Consider 6 small meals or snacks.   3.  Depression:  Improving.  Less negative conversation.  Continue to monitor.  4.  Shortness of breath: At baseline. Avoid respiratory irritants. Respiratory meds as prescribed.   Monitor for worsening of symptoms.  I spent 35 minutes providing this consultation,  from 1330 to 1335. More than 50% of the time in this consultation was spent coordinating communication with patient.   HISTORY OF PRESENT ILLNESS: Follow up with Marc [name]. Pt. Reports some improvement of appetite and sleeping since beginning Mirtazapine.  Mood is stable for now.  Palliative Care was asked to help address goals of care.   CODE STATUS:  DNAR/DNI  PPS: 40% HOSPICE ELIGIBILITY/DIAGNOSIS: TBD  PAST MEDICAL HISTORY:  Past Medical History:  Diagnosis Date  . Anticoagulant long-term use    plavix  . Bradycardia   . Chronic constipation   . Chronic pain of right knee   . CKD (chronic kidney disease), stage III ([place])   . Depression   . Diastolic CHF ([place])   . DOE (dyspnea on exertion)   . GERD (gastroesophageal reflux disease)   . Gout   . History of bleeding peptic ulcer   . History of colon polyps   . History of GI bleed 2007   upper gi bleed from peptic ulcer,  required transfusions  . Hyperlipidemia   .  Hypertension   . IDA (iron deficiency anemia)   . Infection of total right knee replacement ([place]) 11/2017   previous has had right TKA infeciton 04-14-2015  s/p  resection arthroplasty with antiobiotic spacer  . Insomnia   . Neuropathy   . OA (osteoarthritis)    knees, back  . PAD (peripheral artery disease) [facility]) vascular-- dr Scot Dock   01/ 2017  s/p  right femoral peroneal bypass/ amputation right 1st and 2nd toes;    01/ 2014  s/p  stenting to left common/ external iliac   . 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 Thoracic ascending aortic aneurysm [facility])    followed by dr gerhardt-- last chest CT , stable at 4.5cm  . Tobacco abuse   . Type II diabetes mellitus ([place])    followed by pcp    SOCIAL HX:  Lives alone, widowed Social History   Tobacco Use  . Smoking status: Current Every Day Smoker    Packs/day: 1.50    Years: 64.00    Pack years: 96.00    Types: Cigarettes  . Smokeless tobacco: Former Systems developer    Types: [place] date: 12/19/2007  . Tobacco comment: since age 36  Substance Use Topics  . Alcohol use: No    Alcohol/week: 0.0 standard drinks    PHYSICAL EXAM:   General: NAD, frail appearing, thin Cardiovascular: regular rate and rhythm Pulmonary: clear but diminished breath sounds  in bases Extremities:trace edema ble  No erythema.  Ambulates with rollator Skin: hyperpigmentation forearms Neurological: A&O x3, weakness  Psych: pleasant mood  Gonzella Lex, NP-C

## 2019-05-11 NOTE — Progress Notes (Signed)
[facility] Consult Note Telephone: [phone]  Fax: [phone]  PATIENT NAME: Marc [name] DOB: 01-31-1940 MRN: [id number]  PRIMARY CARE PROVIDER:   [name], MD  REFERRING PROVIDER:  [name], MD [place],  [place] [address]  RESPONSIBLE PARTY:   Self     RECOMMENDATIONS and PLAN:  Palliative care encounter  Z51.5  1.  Advance care planning:  DNR and MOST forms are in the home. Comfort care without escalation of treatments are desired.Palliative care will follow-up with patient.  2.  Loss of appetite: Appetite has improved and pt has gained weight. Continue Mirtazapine 15mg  for maintenance    3.  Depression:  Much improved.  Functioning well at home and away from home when necessary.  Enjoys limited visitation from friends and family.  Watches TV for entertainment. Continue Mirtazapine nightly.  Using Seroquel prn for improved sleep.  Continue to monitor.  4.  Shortness of breath:  At baseline.  Related to severe COPD, debilitation and long term smoking hx.  No plans on smoking cessation which was still encouraged. Guarded activities.  Bronchodilator as needed. Monitor.  I spent 40 minutes providing this consultation,  from 1530 to 1610. More than 50% of the time in this consultation was spent coordinating communication with patient.   HISTORY OF PRESENT ILLNESS: Follow-up with Marc [name].  Pt. Reports no recent acute illnesses or injuries.  Weight and appetite are stable. He continues to have shortness of breath upon exertion and continues to smoke. Palliative Care was asked to help address goals of care.   CODE STATUS:  DNAR/DNI  PPS: 40% HOSPICE ELIGIBILITY/DIAGNOSIS: TBD  PAST MEDICAL HISTORY:  Past Medical History:  Diagnosis Date  . Anticoagulant long-term use    plavix  . Bradycardia   . Chronic constipation   . Chronic pain of right knee   . CKD (chronic  kidney disease), stage III ([place])   . Depression   . Diastolic CHF ([place])   . DOE (dyspnea on exertion)   . GERD (gastroesophageal reflux disease)   . Gout   . History of bleeding peptic ulcer   . History of colon polyps   . History of GI bleed 2007   upper gi bleed from peptic ulcer,  required transfusions  . Hyperlipidemia   . Hypertension   . IDA (iron deficiency anemia)   . 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 Insomnia   . Neuropathy   . OA (osteoarthritis)    knees, back  . PAD (peripheral artery disease) [facility] vascular-- dr Scot Dock   01/ 2017  s/p  right femoral peroneal bypass/ amputation right 1st and 2nd toes;    01/ 2014  s/p  stenting to left common/ external iliac   . 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 Thoracic ascending aortic aneurysm [facility])    followed by dr gerhardt-- last chest CT , stable at 4.5cm  . Tobacco abuse   . Type II diabetes mellitus ([place])    followed by pcp    SOCIAL HX:  Social History   Tobacco Use  . Smoking status: Current Every Day Smoker    Packs/day: 1.50    Years: 64.00    Pack years: 96.00    Types: Cigarettes  . Smokeless tobacco: Former Systems developer  Types: [place]    Quit date: 12/19/2007  . Tobacco comment: since age 3  Substance Use Topics  . Alcohol use: No    Alcohol/week: 0.0 standard drinks    ALLERGIES:  Allergies  Allergen Reactions  . Actos [Pioglitazone] Swelling and Other (See Comments)    LOWER EXTREMITY EDEMA      PERTINENT MEDICATIONS:  Outpatient Encounter Medications as of 07/29/2018  Medication Sig  . allopurinol (ZYLOPRIM) 300 MG tablet Take 300 mg by mouth 2 (two) times daily.   [name] atorvastatin (LIPITOR) 10 MG tablet Take 10 mg by mouth every morning.   . clopidogrel (PLAVIX) 75 MG tablet Take 75 mg by mouth every other day.   . docusate sodium (COLACE) 100 MG capsule  Take 100 mg by mouth 2 (two) times daily.  [name] doxycycline (MONODOX) 100 MG capsule Take 100 mg by mouth 2 (two) times daily.  . ferrous sulfate 325 (65 FE) MG tablet Take 325 mg by mouth daily with breakfast.  . furosemide (LASIX) 80 MG tablet Take 1 tablet (80 mg total) by mouth daily.  [name] gabapentin (NEURONTIN) 600 MG tablet Take 1 tablet (600 mg total) by mouth every 12 (twelve) hours. (Patient taking differently: Take 600 mg by mouth at bedtime. )  . glimepiride (AMARYL) 4 MG tablet Take 4 mg by mouth 2 (two) times daily at 10 AM and 5 PM. Taking Amaryl 4 mg 1-2 times per day. Patient states he takes Amaryl 4 mg QAM and if glucose is over 150 mg/dl later in the day he takes second dose of Amaryl 4 mg  . indomethacin (INDOCIN) 25 MG capsule Take 25 mg by mouth 2 (two) times daily as needed (pain.).   [name] insulin glargine (LANTUS) 100 UNIT/ML injection Inject 0.07 mLs (7 Units total) into the skin at bedtime. (Patient not taking: Reported on 12/18/2017)  . methocarbamol (ROBAXIN) 500 MG tablet Take 1 tablet (500 mg total) by mouth every 6 (six) hours as needed for muscle spasms.  . metoprolol tartrate (LOPRESSOR) 25 MG tablet Take 0.5 tablets (12.5 mg total) by mouth 2 (two) times daily. (Patient not taking: Reported on 07/04/2017)  . omeprazole (PRILOSEC) 20 MG capsule Take 20 mg by mouth as needed.  [name] oxyCODONE (OXY IR/ROXICODONE) 5 MG immediate release tablet Take 1-2 tablets (5-10 mg total) by mouth every 6 (six) hours as needed for moderate pain (pain score 4-6).  [name] polyethylene glycol (MIRALAX / GLYCOLAX) packet Take 17 g by mouth at bedtime.  . Prenatal Vit-Fe Fumarate-FA (MULTIVITAMIN-PRENATAL) 27-0.8 MG TABS tablet Take 1 tablet by mouth daily at 12 noon.  . senna-docusate (SENOKOT-S) 8.6-50 MG tablet Take 1 tablet by mouth at bedtime as needed for mild constipation. (Patient not taking: Reported on 07/04/2017)  . Vancomycin (VANCOCIN) 750-5 MG/150ML-% SOLN Inject 150 mLs (750 mg total) into the  vein daily.   No facility-administered encounter medications on file as of 07/29/2018.     PHYSICAL EXAM:   General: NAD, frail appearing, thin Cardiovascular: regular rate and rhythm Pulmonary: coarse non-productive cough  Diminished breath sounds in bases Abdomen: soft, nontender, + bowel sounds Extremities:R knee edema and tenderness.  No erythema.  Ambulates with rollator Skin: hyperpigmentation forearms Neurological: A&O x3, weakness  Psych:  Cheerful and interactive  Gonzella Lex, NP-C

## 2019-05-29 DEATH — deceased
